# Patient Record
Sex: Male | Born: 1960 | Race: Black or African American | Hispanic: No | Marital: Married | Smoking: Former smoker
Health system: Southern US, Community
[De-identification: ages and names within clinical notes are randomized; demographics above are authoritative.]

## PROBLEM LIST (undated history)

## (undated) DIAGNOSIS — J45909 Unspecified asthma, uncomplicated: Secondary | ICD-10-CM

## (undated) DIAGNOSIS — E119 Type 2 diabetes mellitus without complications: Secondary | ICD-10-CM

## (undated) DIAGNOSIS — F32A Depression, unspecified: Secondary | ICD-10-CM

## (undated) DIAGNOSIS — I639 Cerebral infarction, unspecified: Secondary | ICD-10-CM

## (undated) DIAGNOSIS — M1990 Unspecified osteoarthritis, unspecified site: Secondary | ICD-10-CM

## (undated) DIAGNOSIS — N529 Male erectile dysfunction, unspecified: Secondary | ICD-10-CM

## (undated) DIAGNOSIS — R0600 Dyspnea, unspecified: Secondary | ICD-10-CM

## (undated) DIAGNOSIS — J449 Chronic obstructive pulmonary disease, unspecified: Secondary | ICD-10-CM

## (undated) DIAGNOSIS — D869 Sarcoidosis, unspecified: Secondary | ICD-10-CM

## (undated) DIAGNOSIS — E785 Hyperlipidemia, unspecified: Secondary | ICD-10-CM

## (undated) DIAGNOSIS — F419 Anxiety disorder, unspecified: Secondary | ICD-10-CM

## (undated) DIAGNOSIS — I10 Essential (primary) hypertension: Secondary | ICD-10-CM

## (undated) HISTORY — DX: Sarcoidosis, unspecified: D86.9

## (undated) HISTORY — DX: Chronic obstructive pulmonary disease, unspecified: J44.9

## (undated) HISTORY — DX: Essential (primary) hypertension: I10

## (undated) HISTORY — DX: Unspecified asthma, uncomplicated: J45.909

## (undated) HISTORY — DX: Hyperlipidemia, unspecified: E78.5

## (undated) HISTORY — DX: Male erectile dysfunction, unspecified: N52.9

## (undated) HISTORY — PX: LUNG SURGERY: SHX703

## (undated) HISTORY — DX: Unspecified osteoarthritis, unspecified site: M19.90

---

## 2016-09-13 DIAGNOSIS — H9312 Tinnitus, left ear: Secondary | ICD-10-CM | POA: Insufficient documentation

## 2016-10-05 ENCOUNTER — Ambulatory Visit (INDEPENDENT_AMBULATORY_CARE_PROVIDER_SITE_OTHER)
Admission: RE | Admit: 2016-10-05 | Discharge: 2016-10-05 | Disposition: A | Discharge status: Home / Self Care | Payer: Medicaid Other | Source: Ambulatory Visit | Attending: Internal Medicine | Admitting: Internal Medicine

## 2016-10-05 ENCOUNTER — Encounter: Payer: Self-pay | Admitting: Internal Medicine

## 2016-10-05 ENCOUNTER — Ambulatory Visit (INDEPENDENT_AMBULATORY_CARE_PROVIDER_SITE_OTHER): Payer: Medicaid Other | Admitting: Internal Medicine

## 2016-10-05 VITALS — BP 122/86 | HR 83 | Ht 65.0 in | Wt 131.8 lb

## 2016-10-05 DIAGNOSIS — J449 Chronic obstructive pulmonary disease, unspecified: Secondary | ICD-10-CM | POA: Diagnosis not present

## 2016-10-05 DIAGNOSIS — J986 Disorders of diaphragm: Secondary | ICD-10-CM | POA: Diagnosis not present

## 2016-10-05 MED ORDER — PREDNISONE 10 MG PO TABS: ORAL_TABLET | ORAL | 0 refills | Status: DC

## 2016-10-05 MED ORDER — MOMETASONE FURO-FORMOTEROL FUM 200-5 MCG/ACT IN AERO: 2.0000 | INHALATION_SPRAY | Freq: Two times a day (BID) | RESPIRATORY_TRACT | 11 refills | Status: DC

## 2016-10-05 MED ORDER — MOMETASONE FURO-FORMOTEROL FUM 200-5 MCG/ACT IN AERO: 2.0000 | INHALATION_SPRAY | Freq: Two times a day (BID) | RESPIRATORY_TRACT | 0 refills | Status: DC

## 2016-10-05 NOTE — Patient Instructions (Addendum)
Prednisone 10 mg take  4 each am x 2 days,   2 each am x 2 days,  1 each am x 2 days and stop   Plan A = Automatic = dulera 200 Take 2 puffs first thing in am and then another 2 puffs about 12 hours later.    Work on inhaler technique:  relax and gently blow all the way out then take a nice smooth deep breath back in, triggering the inhaler at same time you start breathing in.  Hold for up to 5 seconds if you can. Blow out thru nose. Rinse and gargle with water when done     Plan B = Backup Only use your albuterol (proair) as a rescue medication to be used if you can't catch your breath by resting or doing a relaxed purse lip breathing pattern.  - The less you use it, the better it will work when you need it. - Ok to use the inhaler up to 2 puffs  every 4 hours if you must but call for appointment if use goes up over your usual need - Don't leave home without it !!  (think of it like the spare tire for your car)   Plan C = Crisis - only use your albuterol nebulizer if you first try Plan B and it fails to help > ok to use the nebulizer up to every 4 hours but if start needing it regularly call for immediate appointment   Please schedule a follow up office visit in 6 weeks, call sooner if needed with pfts

## 2016-10-05 NOTE — Progress Notes (Signed)
Subjective:     Patient ID: Glenn [name], male   DOB: 1960-08-23,     MRN: [id number]  HPI    [age] yobm quit smoking 2016 with onset of symptoms in 1995 [place] in [place] dx as asthma vs sarcoid and rx pred/ theoph / albuterol seen by allergist dx as pollen moved to [place] where underwent bilateral lung surgery 2011 ? Lung vol reduction ? Helped breathing at the time but uses [facility] parking and  [facility] = can't walk a nl pace on a flat grade s sob but does fine slow and flat so referred to pulmonary clinic 10/05/2016 by Dr  Tyson DenseAvbuerre    10/05/2016 1st [facility] office visit/ Glenn [name]   Chief Complaint  Patient presents with  . Pulmonary Consult    Referred by Dr. Alveta HeimlichEdwin Avebuere for eval of Asthma and COPD. Pt states he was dxed with COPD and Asthma in 1995. [place] He states his breathing is "okay" today. He states he has a "slight cold"- prod cough with minimal brown sputum.    not much better since quit smoking = freq noct wheeze/ cough and need for saba and while on maint rx = arnuity  Quite a bit better with last pred/ very poor hfa baseline/ way overusing saba in multiple forms  Doe = MMRC2 = can't walk a nl pace on a flat grade s sob but does fine slow and flat eg    No obvious day to day or daytime variability or assoc   mucus plugs or hemoptysis or cp or chest tightness, subjective wheeze or overt sinus or hb symptoms. No unusual exp hx or h/o childhood pna/ asthma or knowledge of premature birth.  Sleeping ok without nocturnal  or early am exacerbation  of respiratory  c/o's or need for noct saba. Also denies any obvious fluctuation of symptoms with weather or environmental changes or other aggravating or alleviating factors except as outlined above   Current Medications, Allergies, Complete Past Medical History, Past Surgical History, Family History, and Social History were reviewed in [facility] electronic medical record.  ROS  The following are not  active complaints unless bolded sore throat, dysphagia, dental problems, itching, sneezing,  nasal congestion or excess/ purulent secretions, ear ache,   fever, chills, sweats, unintended wt loss, classically pleuritic or exertional cp,  orthopnea pnd or leg swelling, presyncope, palpitations, abdominal pain, anorexia, nausea, vomiting, diarrhea  or change in bowel or bladder habits, change in stools or urine, dysuria,hematuria,  rash, arthralgias, visual complaints, headache, numbness, weakness or ataxia or problems with walking or coordination,  change in mood/affect or memory.           Review of Systems     Objective:   Physical Exam Thin amb bm nad   Wt Readings from Last 3 Encounters:  10/05/16 131 lb 12.8 oz (59.8 kg)    Vital signs reviewed  - Note on arrival 02 sats  97% on RA     HEENT: nl dentition, turbinates bilaterally, and oropharynx. Nl external ear canals without cough reflex   NECK :  without JVD/Nodes/TM/ nl carotid upstrokes bilaterally   LUNGS: no acc muscle use,  Nl contour chest with minimal insp and exp rhonchi bilaterally   CV:  RRR  no s3 or murmur or increase in P2, and no edema   ABD:  soft and nontender with nl inspiratory excursion in the supine position. No bruits or organomegaly appreciated, bowel  sounds nl  MS:  Nl gait/ ext warm without deformities, calf tenderness, cyanosis or clubbing No obvious joint restrictions   SKIN: warm and dry without lesions    NEURO:  alert, approp, nl sensorium with  no motor or cerebellar deficits apparent.      CXR PA and Lateral:   10/05/2016 :    I personally reviewed images and   impression as follows:    mild coarse int changes/ prominent bronchial markings/ elevated LHD s comparison       Assessment:

## 2016-10-06 DIAGNOSIS — J986 Disorders of diaphragm: Secondary | ICD-10-CM | POA: Insufficient documentation

## 2016-10-06 NOTE — Assessment & Plan Note (Addendum)
Quit smoking 2016 - Spirometry 10/05/2016  FEV1 1.36 (52%)  Ratio 65 p saba with classic curvature - 10/05/2016  After extensive coaching HFA effectiveness =    75% from a baseline of 25% > try dulera 200 2bid     I reviewed the Fletcher curve with the patient that basically indicates  if you quit smoking when your best day FEV1 is still   preserved (as is only relatively  the case here)  it is highly unlikely you will progress to endstage disease and informed the patient there was  no medication on the market that has proven to alter the curve/ its downward trajectory  or the likelihood of progression of their disease(unlike other chronic medical conditions such as atheroclerosis where we do think we can change the natural hx with risk reducing meds)    Therefore stopping smoking and maintaining abstinence is the most important aspect of care, not choice of inhalers or for that matter, doctors.    rx is directed 100% at symptoms, not changing the natural hx of the dz, and has not done well just on ICS, which have no role as monotherapy in copd and  therefore rec start with dulera 200 2bid based on h/o pred response and for same reason give short course of prednisone to see if can improve/ sustain better baseline function  Total time devoted to counseling  > 50 % of initial 60 min office visit:  review case with pt/wife discussion of options/alternatives/ personally creating written customized instructions  in presence of pt  then going over those specific  Instructions directly with the pt including how to use all of the meds but in particular covering each new medication in detail and the difference between the maintenance= "automatic" meds and the prns using an action plan format for the latter (If this problem/symptom => do that organization reading Left to right).  Please see AVS from this visit for a full list of these instructions which I personally wrote for this pt and  are unique to this visit.

## 2016-10-06 NOTE — Assessment & Plan Note (Signed)
Noted 10/05/2016 s baseline   Work up is typically a ct with contrast and fluoro to be sure there's not medistinal/phrenic nerve compression/ dysfunction but will put this off for now

## 2016-10-06 NOTE — Progress Notes (Signed)
Spoke with pt and notified of results per Dr. Wert. Pt verbalized understanding and denied any questions. 

## 2016-11-18 ENCOUNTER — Ambulatory Visit: Payer: Medicaid Other | Admitting: Internal Medicine

## 2017-10-09 ENCOUNTER — Encounter (HOSPITAL_COMMUNITY): Payer: Self-pay

## 2017-10-09 ENCOUNTER — Ambulatory Visit (HOSPITAL_COMMUNITY)
Admission: EM | Admit: 2017-10-09 | Discharge: 2017-10-09 | Disposition: A | Discharge status: Home / Self Care | Payer: Medicaid Other | Attending: Family Medicine | Admitting: Family Medicine

## 2017-10-09 DIAGNOSIS — Z87891 Personal history of nicotine dependence: Secondary | ICD-10-CM | POA: Diagnosis not present

## 2017-10-09 DIAGNOSIS — Z113 Encounter for screening for infections with a predominantly sexual mode of transmission: Secondary | ICD-10-CM

## 2017-10-09 DIAGNOSIS — Z202 Contact with and (suspected) exposure to infections with a predominantly sexual mode of transmission: Secondary | ICD-10-CM | POA: Diagnosis not present

## 2017-10-09 MED ORDER — VALACYCLOVIR HCL 1 G PO TABS: 1000.0000 mg | ORAL_TABLET | Freq: Two times a day (BID) | ORAL | 0 refills | Status: DC

## 2017-10-09 NOTE — Discharge Instructions (Signed)
We have sent testing for sexually transmitted infections. We will notify you of any positive results once they are received. If required, we will prescribe any medications you might need.  Please refrain from all sexual activity for at least the next seven days.  We have also sent a herpes culture from the lesion on your chin. Begin and finish taking the following: Meds ordered this encounter  Medications   valACYclovir (VALTREX) 1000 MG tablet    Sig: Take 1 tablet (1,000 mg total) by mouth 2 (two) times daily.    Dispense:  14 tablet    Refill:  0

## 2017-10-09 NOTE — ED Provider Notes (Signed)
[facility]   [id number] 10/09/17 Arrival Time: 7741  ASSESSMENT & PLAN:  1. Possible exposure to STD     Discharge Instructions      We have sent testing for sexually transmitted infections. We will notify you of any positive results once they are received. If required, we will prescribe any medications you might need.  Please refrain from all sexual activity for at least the next seven days.  We have also sent a herpes culture from the lesion on your chin. Begin and finish taking the following: Meds ordered this encounter  Medications  . valACYclovir (VALTREX) 1000 MG tablet    Sig: Take 1 tablet (1,000 mg total) by mouth 2 (two) times daily.    Dispense:  14 tablet    Refill:  0   Pending: Labs Reviewed  HSV CULTURE AND TYPING  RPR  HIV ANTIBODY (ROUTINE TESTING)  URINE CYTOLOGY ANCILLARY ONLY   Will notify of any positive results. Instructed to refrain from sexual activity for at least seven days.  Reviewed expectations re: course of current medical issues. Questions answered. Outlined signs and symptoms indicating need for more acute intervention. Patient verbalized understanding. After Visit Summary given.   SUBJECTIVE:  Glenn [name] is a 57 y.o. male who reports concern over possible STD exposure. Reports vaginal and oral sex last week with new male partner. Reports noticing 'a few small bumps' on his chin a few days ago. Tender. No drainage. No penile/genital lesions. No h/o herpes reported. Urinary symptoms: none. Afebrile. No abdominal or pelvic pain. OTC treatment: none.  ROS: As per HPI.  OBJECTIVE:  Vitals:   10/09/17 0931  BP: (!) 153/104  Pulse: 100  Resp: 20  Temp: 97.9 F (36.6 C)  TempSrc: Oral  SpO2: 95%     General appearance: alert, cooperative, appears stated age and no distress Throat: lips, mucosa, and tongue normal; teeth and gums normal Neck: no LAD Back: no CVA tenderness GU: declined Abdomen: soft,  non-tender Skin: warm and dry; over anterior chin he has 2-3 shallow ulcerations each approx 2-65mm in size; tender to touch; no drainage or fluctuance; does shave over this area Psychological: alert and cooperative. Normal mood and affect.  No results found for this or any previous visit.  Labs Reviewed  HSV CULTURE AND TYPING  RPR  HIV ANTIBODY (ROUTINE TESTING)  URINE CYTOLOGY ANCILLARY ONLY    Allergies  Allergen Reactions  . Shrimp [Shellfish Allergy] Itching  . Lisinopril Rash    Tongue swelling    History reviewed. No pertinent past medical history. History reviewed. No pertinent family history. Social History   Socioeconomic History  . Marital status: Married    Spouse name: Not on file  . Number of children: Not on file  . Years of education: Not on file  . Highest education level: Not on file  Occupational History  . Not on file  Social Needs  . Financial resource strain: Not on file  . Food insecurity:    Worry: Not on file    Inability: Not on file  . Transportation needs:    Medical: Not on file    Non-medical: Not on file  Tobacco Use  . Smoking status: Former Smoker    Packs/day: 0.50    Years: 34.00    Pack years: 17.00    Types: Cigarettes    Last attempt to quit: 02/07/2014    Years since quitting: 3.6  . Smokeless tobacco: Never Used  Substance and Sexual  Activity  . Alcohol use: Not on file  . Drug use: Not on file  . Sexual activity: Not on file  Lifestyle  . Physical activity:    Days per week: Not on file    Minutes per session: Not on file  . Stress: Not on file  Relationships  . Social connections:    Talks on phone: Not on file    Gets together: Not on file    Attends religious service: Not on file    Active member of club or organization: Not on file    Attends meetings of clubs or organizations: Not on file    Relationship status: Not on file  . Intimate partner violence:    Fear of current or ex partner: Not on file     Emotionally abused: Not on file    Physically abused: Not on file    Forced sexual activity: Not on file  Other Topics Concern  . Not on file  Social History Narrative  . Not on file          [name], MD 10/09/17 1003

## 2017-10-09 NOTE — ED Triage Notes (Signed)
Pt presents for STD testing 

## 2017-10-10 LAB — URINE CYTOLOGY ANCILLARY ONLY
CHLAMYDIA, DNA PROBE: NEGATIVE
Neisseria Gonorrhea: NEGATIVE
Trichomonas: NEGATIVE

## 2017-10-10 LAB — RPR: RPR: NONREACTIVE

## 2017-10-10 LAB — HIV ANTIBODY (ROUTINE TESTING W REFLEX): HIV SCREEN 4TH GENERATION: NONREACTIVE

## 2017-10-12 LAB — HSV CULTURE AND TYPING

## 2017-10-31 ENCOUNTER — Ambulatory Visit: Payer: Medicaid Other | Admitting: Internal Medicine

## 2017-11-02 ENCOUNTER — Encounter: Payer: Self-pay | Admitting: Gastroenterology

## 2017-11-16 ENCOUNTER — Other Ambulatory Visit: Payer: Self-pay | Admitting: Sports Medicine

## 2017-11-16 DIAGNOSIS — M545 Low back pain, unspecified: Secondary | ICD-10-CM

## 2017-11-21 ENCOUNTER — Ambulatory Visit: Payer: Medicaid Other | Admitting: Internal Medicine

## 2017-11-22 ENCOUNTER — Encounter: Payer: Self-pay | Admitting: Internal Medicine

## 2017-11-22 ENCOUNTER — Other Ambulatory Visit (INDEPENDENT_AMBULATORY_CARE_PROVIDER_SITE_OTHER): Payer: Medicaid Other

## 2017-11-22 ENCOUNTER — Ambulatory Visit (INDEPENDENT_AMBULATORY_CARE_PROVIDER_SITE_OTHER): Payer: Medicaid Other | Admitting: Internal Medicine

## 2017-11-22 ENCOUNTER — Ambulatory Visit (INDEPENDENT_AMBULATORY_CARE_PROVIDER_SITE_OTHER)
Admission: RE | Admit: 2017-11-22 | Discharge: 2017-11-22 | Disposition: A | Discharge status: Home / Self Care | Payer: Medicaid Other | Source: Ambulatory Visit | Attending: Internal Medicine | Admitting: Internal Medicine

## 2017-11-22 ENCOUNTER — Other Ambulatory Visit: Payer: Medicaid Other

## 2017-11-22 VITALS — BP 144/88 | HR 103 | Ht 65.0 in | Wt 142.0 lb

## 2017-11-22 DIAGNOSIS — J449 Chronic obstructive pulmonary disease, unspecified: Secondary | ICD-10-CM

## 2017-11-22 DIAGNOSIS — F17210 Nicotine dependence, cigarettes, uncomplicated: Secondary | ICD-10-CM | POA: Diagnosis not present

## 2017-11-22 LAB — CBC WITH DIFFERENTIAL/PLATELET
BASOS PCT: 0.2 % (ref 0.0–3.0)
Basophils Absolute: 0 10*3/uL (ref 0.0–0.1)
EOS ABS: 0.6 10*3/uL (ref 0.0–0.7)
EOS PCT: 10.8 % — AB (ref 0.0–5.0)
HEMATOCRIT: 43.5 % (ref 39.0–52.0)
HEMOGLOBIN: 14.3 g/dL (ref 13.0–17.0)
LYMPHS PCT: 45.6 % (ref 12.0–46.0)
Lymphs Abs: 2.7 10*3/uL (ref 0.7–4.0)
MCHC: 32.9 g/dL (ref 30.0–36.0)
MCV: 88.7 fl (ref 78.0–100.0)
MONOS PCT: 10.5 % (ref 3.0–12.0)
Monocytes Absolute: 0.6 10*3/uL (ref 0.1–1.0)
Neutro Abs: 1.9 10*3/uL (ref 1.4–7.7)
Neutrophils Relative %: 32.9 % — ABNORMAL LOW (ref 43.0–77.0)
Platelets: 233 10*3/uL (ref 150.0–400.0)
RBC: 4.91 Mil/uL (ref 4.22–5.81)
RDW: 13.9 % (ref 11.5–15.5)
WBC: 5.9 10*3/uL (ref 4.0–10.5)

## 2017-11-22 MED ORDER — ALBUTEROL SULFATE (2.5 MG/3ML) 0.083% IN NEBU: 2.5000 mg | INHALATION_SOLUTION | Freq: Four times a day (QID) | RESPIRATORY_TRACT | 2 refills | Status: DC | PRN

## 2017-11-22 MED ORDER — MOMETASONE FURO-FORMOTEROL FUM 200-5 MCG/ACT IN AERO: 2.0000 | INHALATION_SPRAY | Freq: Two times a day (BID) | RESPIRATORY_TRACT | 2 refills | Status: DC

## 2017-11-22 MED ORDER — ALBUTEROL SULFATE HFA 108 (90 BASE) MCG/ACT IN AERS: 2.0000 | INHALATION_SPRAY | Freq: Four times a day (QID) | RESPIRATORY_TRACT | 2 refills | Status: DC | PRN

## 2017-11-22 NOTE — Progress Notes (Addendum)
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 Lung vol reduction ? Helped breathing at the time but uses [facility] parking and  [facility] = can't walk a nl pace on a flat grade s sob but does fine slow and flat so referred to pulmonary clinic 10/05/2016 by Dr  Tyson Dense    History of Present Illness  10/05/2016 1st [facility] office visit/ Glenn [name]   Chief Complaint  Patient presents with  . Pulmonary Consult    Referred by Dr. Alveta Heimlich for eval of Asthma and COPD. Pt states he was dxed with COPD and Asthma in 1995. He moved here a year ago from [place], [place]. He states his breathing is "okay" today. He states he has a "slight cold"- prod cough with minimal brown sputum.    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  Work on Printmaker B = Backup Only use your albuterol (proair) as a rescue medication Plan C = Crisis - only use your albuterol nebulizer if you first try Plan B and it fails to help > ok to use the nebulizer up to every 4 hours but if start needing it regularly call for immediate appointment Please schedule a follow up office visit in 6 weeks, call sooner if needed with pfts     11/22/2017  f/u ov/Glenn [name] re:  GOLD II/ still smoking   Chief Complaint  Patient presents with  . Follow-up    Breathing is overall doing okay. He is wheezing and has occ non prod cough. He is using his albuterol inhaler and neb both 3-4 x daily.   Dyspnea:  MMRC2 = can't walk a nl pace on a flat grade s sob but does fine slow and flat  Cough: mostly dry, worse in house where he thinks there is mold Sleeping: some wheezing noct/ freq saba noct SABA use: as above 02: none    No obvious day to day or daytime variability or assoc excess/ purulent sputum or mucus plugs or hemoptysis or cp or chest tightness,   or overt sinus or hb symptoms.    Also denies any obvious fluctuation of symptoms with weather or environmental changes or other aggravating or alleviating factors except as outlined above   No unusual exposure hx or h/o childhood pna/ asthma or knowledge of premature birth.  Current Allergies, Complete Past Medical History, Past Surgical History, Family History, and Social History were reviewed in [facility] record.  ROS  The following are not active  complaints unless bolded Hoarseness, sore throat, dysphagia, dental problems, itching, sneezing,  nasal congestion or discharge of excess mucus or purulent secretions, ear ache,   fever, chills, sweats, unintended wt loss or wt gain, classically pleuritic or exertional cp,  orthopnea pnd or arm/hand swelling  or leg swelling, presyncope, palpitations, abdominal pain, anorexia, nausea, vomiting, diarrhea  or change in bowel habits or change in bladder habits, change in stools or change in urine, dysuria, hematuria,  rash, arthralgias, visual complaints, headache, numbness, weakness or ataxia or problems with walking or coordination,  change in mood or  memory.        Current Meds  Medication Sig  . albuterol (PROVENTIL HFA) 108 (90 Base) MCG/ACT inhaler Inhale 2 puffs into the lungs every 6 (six) hours as needed for wheezing or shortness of breath.  Marland Kitchen albuterol  (PROVENTIL) (2.5 MG/3ML) 0.083% nebulizer solution Take 3 mLs (2.5 mg total) by nebulization every 6 (six) hours as needed for wheezing or shortness of breath.  Marland Kitchen amLODipine (NORVASC) 10 MG tablet Take 10 mg by mouth daily.  Marland Kitchen gabapentin (NEURONTIN) 300 MG capsule Take 300 mg by mouth 3 (three) times daily.  . mometasone-formoterol (DULERA) 200-5 MCG/ACT AERO Inhale 2 puffs into the lungs 2 (two) times daily.  Marland Kitchen oxyCODONE (ROXICODONE) 15 MG immediate release tablet Take 15 mg by mouth every 4 (four) hours as needed for pain.  . sildenafil (REVATIO) 20 MG tablet Take 20 mg by mouth daily as needed.  . sildenafil (VIAGRA) 100 MG tablet Take 100 mg by mouth daily as needed for erectile dysfunction.  . traMADol (ULTRAM) 50 MG tablet Take 50 mg by mouth every 12 (twelve) hours as needed.  . valACYclovir (VALTREX) 1000 MG tablet Take 1 tablet (1,000 mg total) by mouth 2 (two) times daily.  . Vitamin D, Ergocalciferol, (DRISDOL) 50000 units CAPS capsule Take 50,000 Units by mouth every 7 (seven) days.  Marland Kitchen   albuterol (PROVENTIL HFA) 108 (90 Base) MCG/ACT inhaler Inhale 2 puffs into the lungs every 6 (six) hours as needed for wheezing or shortness of breath.  Marland Kitchen   albuterol (PROVENTIL) (2.5 MG/3ML) 0.083% nebulizer solution Take 2.5 mg by nebulization every 6 (six) hours as needed for wheezing or shortness of breath.  . [  mometasone-formoterol (DULERA) 200-5 MCG/ACT AERO Inhale 2 puffs into the lungs 2 (two) times daily.              Objective:   Physical Exam    Thin amb bm nad   Wt Readings from Last 3 Encounters:  11/22/17 142 lb (64.4 kg)  10/05/16 131 lb 12.8 oz (59.8 kg)     Vital signs reviewed - Note on arrival 02 sats  97% on RA          HEENT: nl dentition / oropharynx. Nl external ear canals without cough reflex -  Mild bilateral non-specific turbinate edema     NECK :  without JVD/Nodes/TM/ nl carotid upstrokes bilaterally   LUNGS: no acc muscle use,  Mild barrel  contour  chest wall with bilateral  Distant insp/ exp rhonchi   and  without cough on insp or exp maneuver and mild  Hyperresonant  to  percussion bilaterally     CV:  RRR  no s3 or murmur or increase in P2, and no edema   ABD:  soft and nontender with pos late insp Hoover's  in the supine position. No bruits or organomegaly appreciated, bowel sounds nl  MS:  Nl gait/  ext warm without deformities, calf tenderness, cyanosis or clubbing No obvious joint restrictions   SKIN: warm and dry without lesions    NEURO:  alert, approp, nl sensorium with  no motor or cerebellar deficits apparent.          CXR PA and Lateral:   11/22/2017 :    I personally reviewed images and agree with radiology impression as follows:   Bilateral sequela of previous surgeries in the lungs. No acute abnormalities identified.   Labs ordered 11/22/2017  Allergy profile     Assessment:

## 2017-11-22 NOTE — Patient Instructions (Addendum)
Prednisone 10 mg take  4 each am x 2 days,   2 each am x 2 days,  1 each am x 2 days and stop   Plan A = Automatic = dulera 200 Take 2 puffs first thing in am and then another 2 puffs about 12 hours later.   Work on inhaler technique:  relax and gently blow all the way out then take a nice smooth deep breath back in, triggering the inhaler at same time you start breathing in.  Hold for up to 5 seconds if you can. Blow out thru nose. Rinse and gargle with water when done  Plan B = Backup Only use your albuterol (proair) as a rescue medication to be used if you can't catch your breath by resting or doing a relaxed purse lip breathing pattern.  - The less you use it, the better it will work when you need it. - Ok to use the inhaler up to 2 puffs  every 4 hours if you must but call for appointment if use goes up over your usual need - Don't leave home without it !!  (think of it like the spare tire for your car)   Plan C = Crisis - only use your albuterol nebulizer if you first try Plan B and it fails to help > ok to use the nebulizer up to every 4 hours but if start needing it regularly call for immediate appointment   The key is to stop smoking completely before smoking completely stops you!   Please remember to go to the lab and x-ray department downstairs in the basement  for your tests - we will call you with the results when they are available.     Please schedule a follow up office visit in 6 weeks, call sooner if needed with pfts on return

## 2017-11-23 LAB — RESPIRATORY ALLERGY PROFILE REGION II ~~LOC~~
Allergen, A. alternata, m6: <0.1 kU/L
Allergen, Cedar tree, t12: <0.1 kU/L
Allergen, Comm Silver Birch, t9: <0.1 kU/L
Allergen, Cottonwood, t14: <0.1 kU/L
Allergen, Mulberry, t76: <0.1 kU/L
Bermuda Grass: <0.1 kU/L
CLADOSPORIUM HERBARUM (M2) IGE: <0.1 kU/L
CLASS: 0
CLASS: 0
CLASS: 0
CLASS: 0
CLASS: 0
CLASS: 0
COCKROACH: 0.91 kU/L — AB
COMMON RAGWEED (SHORT) (W1) IGE: <0.1 kU/L
Cat Dander: <0.1 kU/L
Class: 0
Class: 0
Class: 0
Class: 0
Class: 0
Class: 0
Class: 0
Class: 0
Class: 0
Class: 0
Class: 0
Class: 0
Class: 0
Class: 0
Class: 0
Class: 0
Class: 0
Class: 2
Dog Dander: <0.1 kU/L
IGE (IMMUNOGLOBULIN E), SERUM: 25 kU/L (ref <=114)
Pecan/Hickory Tree IgE: <0.1 kU/L
Sheep Sorrel IgE: <0.1 kU/L

## 2017-11-23 LAB — INTERPRETATION:

## 2017-11-23 NOTE — Progress Notes (Signed)
Spoke with pt and notified of results per Dr. Wert. Pt verbalized understanding and denied any questions. 

## 2017-11-24 ENCOUNTER — Encounter: Payer: Self-pay | Admitting: Internal Medicine

## 2017-11-24 DIAGNOSIS — F17210 Nicotine dependence, cigarettes, uncomplicated: Secondary | ICD-10-CM | POA: Insufficient documentation

## 2017-11-24 NOTE — Assessment & Plan Note (Signed)
Quit smoking 2016 - Spirometry 10/05/2016  FEV1 1.36 (52%)  Ratio 65 p saba with classic curvature - 10/05/2016  > try dulera 200 2bid   - Allergy profile 11/22/2017 >  Eos 0.6 /  IgE  25 RAST neg x for roaches/ neg for mold  - 11/22/2017  After extensive coaching inhaler device,  effectiveness =    75% > continue dulera 200 2bid   Way over using saba at baseline. DDX of  difficult airways management almost all start with A and  include Adherence, Ace Inhibitors, Acid Reflux, Active Sinus Disease, Alpha 1 Antitripsin deficiency, Anxiety masquerading as Airways dz,  ABPA,  Allergy(esp in young), Aspiration (esp in elderly), Adverse effects of meds,  Active smoking or vaping, A bunch of PE's (a small clot burden can't cause this syndrome unless there is already severe underlying pulm or vascular dz with poor reserve) plus two Bs  = Bronchiectasis and Beta blocker use..and one C= CHF   Adherence is always the initial "prime suspect" and is a multilayered concern that requires a "trust but verify" approach in every patient - starting with knowing how to use medications, especially inhalers, correctly, keeping up with refills and understanding the fundamental difference between maintenance and prns vs those medications only taken for a very short course and then stopped and not refilled.  - see hfa teaching - return with all meds in hand using a trust but verify approach to confirm accurate Medication  Reconciliation The principal here is that until we are certain that the  patients are doing what we've asked, it makes no sense to ask them to do more.   Active smoking also at top of list > see sep a/p  ? Allergy / asthmatic component > allergy profile pos for Eos but neg for mold noted > advised regardless of identification of allergen if he's sure his present appt is making him sick he actively should seek alternative  ? Anxiety/depression > usually at the bottom of this list of usual suspects but should  be much higher on this pt's based on H and P  and may interfere with adherence and also interpretation of response or lack thereof to symptom management which can be quite subjective.

## 2017-11-24 NOTE — Assessment & Plan Note (Signed)
4-5 min discussion re active cigarette smoking in addition to office E&M  Ask about tobacco use:   ongoing Advise quitting   I emphasized that although we never turn away smokers from the pulmonary clinic, we do ask that they understand that the recommendations that we make  won't work nearly as well in the presence of continued cigarette exposure. In fact, we may very well  reach a point where we can't promise to help the patient if he/she can't quit smoking. (We can and will promise to try to help, we just can't promise what we recommend will really work)  Assess willingness:  Not committed at this point Assist in quit attempt:  Per PCP when ready Arrange follow up:   Follow up per Primary Care planned      I had an extended discussion with the patient reviewing all relevant studies completed to date and  lasting 15 to 20 minutes of a 25 minute visit    See device teaching which extended face to face time for this visit.  Each maintenance medication was reviewed in detail including emphasizing most importantly the difference between maintenance and prns and under what circumstances the prns are to be triggered using an action plan format that is not reflected in the computer generated alphabetically organized AVS which I have not found useful in most complex patients, especially with respiratory illnesses  Please see AVS for specific instructions unique to this visit that I personally wrote and verbalized to the the pt in detail and then reviewed with pt  by my nurse highlighting any  changes in therapy recommended at today's visit to their plan of care.        

## 2017-12-07 ENCOUNTER — Ambulatory Visit (HOSPITAL_COMMUNITY)
Admission: EM | Admit: 2017-12-07 | Discharge: 2017-12-07 | Disposition: A | Discharge status: Home / Self Care | Payer: Medicaid Other | Attending: Family Medicine | Admitting: Family Medicine

## 2017-12-07 ENCOUNTER — Encounter (HOSPITAL_COMMUNITY): Payer: Self-pay | Admitting: Emergency Medicine

## 2017-12-07 DIAGNOSIS — Z113 Encounter for screening for infections with a predominantly sexual mode of transmission: Secondary | ICD-10-CM | POA: Insufficient documentation

## 2017-12-07 DIAGNOSIS — R21 Rash and other nonspecific skin eruption: Secondary | ICD-10-CM | POA: Diagnosis present

## 2017-12-07 DIAGNOSIS — S50862A Insect bite (nonvenomous) of left forearm, initial encounter: Secondary | ICD-10-CM

## 2017-12-07 DIAGNOSIS — Z79899 Other long term (current) drug therapy: Secondary | ICD-10-CM | POA: Diagnosis not present

## 2017-12-07 DIAGNOSIS — W57XXXA Bitten or stung by nonvenomous insect and other nonvenomous arthropods, initial encounter: Secondary | ICD-10-CM | POA: Diagnosis not present

## 2017-12-07 DIAGNOSIS — Z711 Person with feared health complaint in whom no diagnosis is made: Secondary | ICD-10-CM

## 2017-12-07 DIAGNOSIS — Z888 Allergy status to other drugs, medicaments and biological substances status: Secondary | ICD-10-CM | POA: Diagnosis not present

## 2017-12-07 DIAGNOSIS — Z91013 Allergy to seafood: Secondary | ICD-10-CM | POA: Diagnosis not present

## 2017-12-07 DIAGNOSIS — F17210 Nicotine dependence, cigarettes, uncomplicated: Secondary | ICD-10-CM | POA: Diagnosis not present

## 2017-12-07 DIAGNOSIS — Z202 Contact with and (suspected) exposure to infections with a predominantly sexual mode of transmission: Secondary | ICD-10-CM

## 2017-12-07 MED ORDER — AZITHROMYCIN 250 MG PO TABS
ORAL_TABLET | ORAL | Status: AC
  Filled 2017-12-07: qty 4

## 2017-12-07 MED ORDER — AZITHROMYCIN 250 MG PO TABS
1000.0000 mg | ORAL_TABLET | Freq: Once | ORAL | Status: AC
  Administered 2017-12-07: 1000 mg via ORAL

## 2017-12-07 MED ORDER — CEFTRIAXONE SODIUM 250 MG IJ SOLR
INTRAMUSCULAR | Status: AC
  Filled 2017-12-07: qty 250

## 2017-12-07 MED ORDER — CEFTRIAXONE SODIUM 250 MG IJ SOLR
250.0000 mg | Freq: Once | INTRAMUSCULAR | Status: AC
  Administered 2017-12-07: 250 mg via INTRAMUSCULAR

## 2017-12-07 NOTE — ED Triage Notes (Signed)
Pt also wants std check.

## 2017-12-07 NOTE — ED Provider Notes (Signed)
[facility]   [id number] 12/07/17 Arrival Time: 1238  CC: SKIN COMPLAINT and STD concern  SUBJECTIVE:  Glenn [name] is a 57 y.o. male who presents with a possible bug bite.  Symptoms began after staying in a hotel.  Localizes the rash to left forearm.  Describes it as red and itchy.  Denies aggravating or alleviating factors.  Denies similar symptoms in the past.  Denies fever, chills, nausea, vomiting, erythema, redness, swollen glands, SOB, chest pain, abdominal pain, changes in bowel or bladder function.    Patient requesting STD check.  Last unprotected sex a couple of weeks ago.  Sexually active with 1 male partner.   Reports previous symptoms in the past.  Denies fever, chills, nausea, vomiting, abdominal or pelvic pain, dysuria, penile discharge, penile rashes or lesions, testicular swelling or pain.    ROS: As per HPI.  History reviewed. No pertinent past medical history. History reviewed. No pertinent surgical history. Allergies  Allergen Reactions  . Shrimp [Shellfish Allergy] Itching  . Lisinopril Rash    Tongue swelling   No current facility-administered medications on file prior to encounter.    Current Outpatient Medications on File Prior to Encounter  Medication Sig Dispense Refill  . albuterol (PROVENTIL HFA) 108 (90 Base) MCG/ACT inhaler Inhale 2 puffs into the lungs every 6 (six) hours as needed for wheezing or shortness of breath. 1 Inhaler 2  . albuterol (PROVENTIL) (2.5 MG/3ML) 0.083% nebulizer solution Take 3 mLs (2.5 mg total) by nebulization every 6 (six) hours as needed for wheezing or shortness of breath. 150 mL 2  . amLODipine (NORVASC) 10 MG tablet Take 10 mg by mouth daily.    Marland Kitchen gabapentin (NEURONTIN) 300 MG capsule Take 300 mg by mouth 3 (three) times daily.    . mometasone-formoterol (DULERA) 200-5 MCG/ACT AERO Inhale 2 puffs into the lungs 2 (two) times daily. 1 Inhaler 2  . oxyCODONE (ROXICODONE) 15 MG immediate release tablet Take 15 mg  by mouth every 4 (four) hours as needed for pain.    . sildenafil (REVATIO) 20 MG tablet Take 20 mg by mouth daily as needed.    . sildenafil (VIAGRA) 100 MG tablet Take 100 mg by mouth daily as needed for erectile dysfunction.    . traMADol (ULTRAM) 50 MG tablet Take 50 mg by mouth every 12 (twelve) hours as needed.    . valACYclovir (VALTREX) 1000 MG tablet Take 1 tablet (1,000 mg total) by mouth 2 (two) times daily. 14 tablet 0  . Vitamin D, Ergocalciferol, (DRISDOL) 50000 units CAPS capsule Take 50,000 Units by mouth every 7 (seven) days.     Social History   Socioeconomic History  . Marital status: Married    Spouse name: Not on file  . Number of children: Not on file  . Years of education: Not on file  . Highest education level: Not on file  Occupational History  . Not on file  Social Needs  . Financial resource strain: Not on file  . Food insecurity:    Worry: Not on file    Inability: Not on file  . Transportation needs:    Medical: Not on file    Non-medical: Not on file  Tobacco Use  . Smoking status: Current Every Day Smoker    Packs/day: 0.50    Years: 34.00    Pack years: 17.00    Types: Cigarettes  . Smokeless tobacco: Never Used  Substance and Sexual Activity  . Alcohol use: Not on file  .  Drug use: Not on file  . Sexual activity: Not on file  Lifestyle  . Physical activity:    Days per week: Not on file    Minutes per session: Not on file  . Stress: Not on file  Relationships  . Social connections:    Talks on phone: Not on file    Gets together: Not on file    Attends religious service: Not on file    Active member of club or organization: Not on file    Attends meetings of clubs or organizations: Not on file    Relationship status: Not on file  . Intimate partner violence:    Fear of current or ex partner: Not on file    Emotionally abused: Not on file    Physically abused: Not on file    Forced sexual activity: Not on file  Other Topics Concern   . Not on file  Social History Narrative  . Not on file   No family history on file.  OBJECTIVE: Vitals:   12/07/17 1244  BP: 129/89  Pulse: 93  Resp: 16  Temp: 98.1 F (36.7 C)  SpO2: 97%    General appearance: alert; no distress  HEENT: NCAT; Ears: EACs clear, TMs pearly gray; Eyes: PERRL.  EOM grossly intact.  Nose: no rhinorrhea; Throat: tonsils nonerythematous, uvula midline  Lungs: clear to auscultation bilaterally Heart: regular rate and rhythm.  Radial pulse 2+ bilaterally Abdomen: soft, nondistended, normal active bowel sounds; nontender to palpation; no guarding Extremities: no edema Skin: warm and dry; 1 small erythematous papule/pustule localized to left anterior forearm; <0.5 cm in diameter; nontender; no active discharge; no surrounding erythema  Psychological: alert and cooperative; normal mood and affect  ASSESSMENT & PLAN:  1. Bug bite, initial encounter   2. Concern about STD in male without diagnosis     Meds ordered this encounter  Medications  . cefTRIAXone (ROCEPHIN) injection 250 mg    Order Specific Question:   Antibiotic Indication:    Answer:   STD  . azithromycin (ZITHROMAX) tablet 1,000 mg   Wash all clothes and bed linens in home with hot water and detergent It appears the bug bite was an isolated event, but if you start having new bites please come back in to be reevaluated. Given rocephin 250mg  injection and azithromycin 1g in office Urine cytology obtained HIV/ syphilis testing today We will follow up with you regarding the results of your test If tests are positive, please abstain from sexual activity for at least 7 days and notify partners Follow up with PCP if symptoms persists Return here or go to ER if you have any new or worsening symptoms    Reviewed expectations re: course of current medical issues. Questions answered. Outlined signs and symptoms indicating need for more acute intervention. Patient verbalized  understanding. After Visit Summary given.   Rennis Harding, PA-C 12/07/17 1326

## 2017-12-07 NOTE — ED Triage Notes (Signed)
Pt states he stayed in a hotel that he found bed bugs. Pt states he noticed bumps on his skin and is worried about bites.

## 2017-12-07 NOTE — Discharge Instructions (Addendum)
Wash all clothes and bed linens in home water and with detergent It appears the bug bite was an isolated event, but if you start having new bites please come back in to be reevaluated. Given rocephin 250mg  injection and azithromycin 1g in office Urine cytology obtained HIV/ syphilis testing today We will follow up with you regarding the results of your test If tests are positive, please abstain from sexual activity for at least 7 days and notify partners Follow up with PCP if symptoms persists Return here or go to ER if you have any new or worsening symptoms

## 2017-12-08 LAB — RPR: RPR Ser Ql: NONREACTIVE

## 2017-12-08 LAB — URINE CYTOLOGY ANCILLARY ONLY
Chlamydia: NEGATIVE
Neisseria Gonorrhea: NEGATIVE
TRICH (WINDOWPATH): NEGATIVE

## 2017-12-08 LAB — HIV ANTIBODY (ROUTINE TESTING W REFLEX): HIV Screen 4th Generation wRfx: NONREACTIVE

## 2017-12-12 ENCOUNTER — Encounter: Payer: Self-pay | Admitting: Gastroenterology

## 2017-12-12 ENCOUNTER — Ambulatory Visit: Payer: Medicaid Other | Admitting: Gastroenterology

## 2017-12-12 VITALS — BP 110/72 | HR 76 | Ht 65.0 in | Wt 138.0 lb

## 2017-12-12 DIAGNOSIS — Z1211 Encounter for screening for malignant neoplasm of colon: Secondary | ICD-10-CM | POA: Diagnosis not present

## 2017-12-12 DIAGNOSIS — Z86010 Personal history of colonic polyps: Secondary | ICD-10-CM

## 2017-12-12 NOTE — Progress Notes (Signed)
GASTROENTEROLOGY OUTPATIENT CLINIC VISIT   Primary Care Provider Fleet Contras, MD [address] [place] [address] [phone]  Referring Provider Fleet Contras, MD [address] [place], [place] [address] [phone]  Patient Profile: Taurean Ju is a 57 y.o. male with a pmh significant for COPD/Asthma, HTN, OA.  The patient presents to the [facility] for an evaluation and management of problem(s) noted below:  Problem List 1. Colon cancer screening   2. History of colonic polyps     History of Present Illness: This is the patient's first visit to the GI clinic.  He reports a prior colonoscopy 5-years ago.  He reports polyps being found but does not know the exact type of polyps that were removed.  He has no family history of colon cancers or GI malignancies.  The patient also has a bowel movement on a daily basis that is Bristol scale 4.  He denies any blood in the stools.  He does not have constipation.  He is otherwise doing very well.  GI Review of Systems Positive as above Negative for pyrosis, dysphagia, odynophagia, jaundice, abdominal pain, change in appetite early satiety, bloating, melena, hematochezia  Review of Systems General: Denies fevers/chills/weight loss HEENT: Denies oral lesions Cardiovascular: Denies chest pain/palpitations Pulmonary: SOB is at baseline as well as cough Gastroenterological: See HPI Genitourinary: Denies darkened urine Hematological: Denies easy bruising/bleeding Endocrine: Denies temperature intolerance Dermatological: Denies jaundice Psychological: Mood is stable Musculoskeletal: Denies new arthralgias   Medications Current Outpatient Medications  Medication Sig Dispense Refill  . albuterol (PROVENTIL HFA) 108 (90 Base) MCG/ACT inhaler Inhale 2 puffs into the lungs every 6 (six) hours as needed for wheezing or shortness of breath. 1 Inhaler 2  . albuterol (PROVENTIL) (2.5 MG/3ML) 0.083%  nebulizer solution Take 3 mLs (2.5 mg total) by nebulization every 6 (six) hours as needed for wheezing or shortness of breath. 150 mL 2  . amLODipine (NORVASC) 10 MG tablet Take 10 mg by mouth daily.    Marland Kitchen aspirin EC 81 MG tablet Take 81 mg by mouth daily.    Marland Kitchen gabapentin (NEURONTIN) 300 MG capsule Take 300 mg by mouth 3 (three) times daily.    . mometasone-formoterol (DULERA) 200-5 MCG/ACT AERO Inhale 2 puffs into the lungs 2 (two) times daily. 1 Inhaler 2  . nitroGLYCERIN (NITROSTAT) 0.4 MG SL tablet Place 0.4 mg under the tongue every 5 (five) minutes as needed for chest pain.    Marland Kitchen oxyCODONE (ROXICODONE) 15 MG immediate release tablet Take 15 mg by mouth every 4 (four) hours as needed for pain.    . sildenafil (VIAGRA) 100 MG tablet Take 100 mg by mouth daily as needed for erectile dysfunction.    . traMADol (ULTRAM) 50 MG tablet Take 50 mg by mouth every 12 (twelve) hours as needed.    . valACYclovir (VALTREX) 1000 MG tablet Take 1 tablet (1,000 mg total) by mouth 2 (two) times daily. 14 tablet 0  . Vitamin D, Ergocalciferol, (DRISDOL) 50000 units CAPS capsule Take 50,000 Units by mouth every 7 (seven) days.     No current facility-administered medications for this visit.     Allergies Allergies  Allergen Reactions  . Shrimp [Shellfish Allergy] Itching  . Lisinopril Rash    Tongue swelling    Histories Past Medical History:  Diagnosis Date  . Arthritis   . Asthma   . COPD (chronic obstructive pulmonary disease) (HCC)   . ED (erectile dysfunction)   . Hypertension    Past  Surgical History:  Procedure Laterality Date  . LUNG SURGERY     removed tissue   Social History   Socioeconomic History  . Marital status: Married    Spouse name: Not on file  . Number of children: Not on file  . Years of education: Not on file  . Highest education level: Not on file  Occupational History  . Not on file  Social Needs  . Financial resource strain: Not on file  . Food insecurity:      Worry: Not on file    Inability: Not on file  . Transportation needs:    Medical: Not on file    Non-medical: Not on file  Tobacco Use  . Smoking status: Current Every Day Smoker    Packs/day: 0.50    Years: 34.00    Pack years: 17.00    Types: Cigarettes  . Smokeless tobacco: Never Used  . Tobacco comment: tobacco given today 12/12/17  Substance and Sexual Activity  . Alcohol use: Never    Frequency: Never  . Drug use: Never  . Sexual activity: Not on file  Lifestyle  . Physical activity:    Days per week: Not on file    Minutes per session: Not on file  . Stress: Not on file  Relationships  . Social connections:    Talks on phone: Not on file    Gets together: Not on file    Attends religious service: Not on file    Active member of club or organization: Not on file    Attends meetings of clubs or organizations: Not on file    Relationship status: Not on file  . Intimate partner violence:    Fear of current or ex partner: Not on file    Emotionally abused: Not on file    Physically abused: Not on file    Forced sexual activity: Not on file  Other Topics Concern  . Not on file  Social History Narrative  . Not on file   Family History  Problem Relation Age of Onset  . Hypertension Mother   . Diabetes Maternal Grandmother   . Colon cancer Neg Hx   . Esophageal cancer Neg Hx   . Rectal cancer Neg Hx   . Inflammatory bowel disease Neg Hx   . Liver disease Neg Hx   . Pancreatic cancer Neg Hx    I have reviewed his medical, social, and family history in detail and updated the electronic medical record as necessary.    PHYSICAL EXAMINATION  BP 110/72   Pulse 76   Ht 5\' 5"  (1.651 m)   Wt 138 lb (62.6 kg)   BMI 22.96 kg/m  Wt Readings from Last 3 Encounters:  12/12/17 138 lb (62.6 kg)  11/22/17 142 lb (64.4 kg)  10/05/16 131 lb 12.8 oz (59.8 kg)  GEN: NAD, appears stated age, doesn't appear chronically ill PSYCH: Cooperative, without pressured  speech EYE: Conjunctivae pink, sclerae anicteric ENT: MMM, without oral ulcers, no erythema or exudates noted NECK: Supple CV: RR without R/Gs  RESP: Wheezing appreciated GI: NABS, soft, NT/ND, without rebound or guarding, no HSM appreciated MSK/EXT: No LE edema SKIN: No jaundice NEURO:  Alert & Oriented x 3, no focal deficits   REVIEW OF DATA  I reviewed the following data at the time of this encounter:  GI Procedures and Studies  Reported history of colonoscopy 5-years ago and need to get records  Laboratory Studies  Reviewed in epic  Imaging  Studies  No relevant studies   ASSESSMENT  Mr. Gadson is a 58 y.o. male with a pmh significant for COPD/Asthma, HTN, OA.  The patient is seen today for evaluation and management of:  1. Colon cancer screening   2. History of colonic polyps    This is a patient who is clinically stable.  We will try to obtain the patient's colonoscopy records to review the number of polyps as well as type of polyps that he had.  If he truly has adenomatous polyps he will be due for colonoscopy.  If he has only hyperplastic polyps that he may not be indicated for follow-up as of yet and completely asymptomatic patient.  With that being said if he cannot get the records we will proceed with a repeat colonoscopy because of the reported history of polyps.  Patient will try to get these records and we will also and plan accordingly.  The patient has a normal blood count as of the labs from just 3 weeks ago so I do not see an indication for evaluation for iron deficiency anemia.  All patient questions were answered, to the best of my ability, and the patient agrees to the aforementioned plan of action with follow-up as indicated.   PLAN  1. Colon cancer screening - Try and get records to see colonoscopy if unable then plan to repeat colonoscopy  2. History of colonic polyps   No orders of the defined types were placed in this encounter.   New Prescriptions    No medications on file   Modified Medications   No medications on file    Planned Follow Up: No follow-ups on file.   Corliss Parish, MD [facility] Office # [phone]

## 2017-12-12 NOTE — Patient Instructions (Signed)
Normal BMI (Body Mass Index- based on height and weight) is between 19 and 25. Your BMI today is Body mass index is 22.96 kg/m. Marland Kitchen Please consider follow up  regarding your BMI with your Primary Care Provider.  Thank you for entrusting me with your care and choosing [facility].  Dr Meridee Score

## 2017-12-17 ENCOUNTER — Encounter: Payer: Self-pay | Admitting: Gastroenterology

## 2017-12-18 DIAGNOSIS — Z1211 Encounter for screening for malignant neoplasm of colon: Secondary | ICD-10-CM | POA: Insufficient documentation

## 2017-12-18 DIAGNOSIS — Z86010 Personal history of colonic polyps: Secondary | ICD-10-CM | POA: Insufficient documentation

## 2018-01-08 ENCOUNTER — Ambulatory Visit: Payer: Medicaid Other | Admitting: Internal Medicine

## 2018-01-09 ENCOUNTER — Ambulatory Visit (HOSPITAL_COMMUNITY)
Admission: EM | Admit: 2018-01-09 | Discharge: 2018-01-09 | Disposition: A | Discharge status: Home / Self Care | Payer: Medicaid Other | Attending: Family Medicine | Admitting: Family Medicine

## 2018-01-09 ENCOUNTER — Other Ambulatory Visit: Payer: Self-pay

## 2018-01-09 ENCOUNTER — Encounter (HOSPITAL_COMMUNITY): Payer: Self-pay | Admitting: Emergency Medicine

## 2018-01-09 DIAGNOSIS — L0291 Cutaneous abscess, unspecified: Secondary | ICD-10-CM

## 2018-01-09 MED ORDER — DOXYCYCLINE HYCLATE 100 MG PO CAPS: 100.0000 mg | ORAL_CAPSULE | Freq: Two times a day (BID) | ORAL | 0 refills | Status: AC

## 2018-01-09 NOTE — ED Provider Notes (Signed)
[facility]    CSN: [id number] Arrival date & time: 01/09/18  0844     History   Chief Complaint Chief Complaint  Patient presents with  . Abscess    HPI Glenn [name] is a 57 y.o. male history of hypertension, COPD, asthma presenting today for evaluation of swelling to left ear.  Patient states that he has had a keloid there for the past years.  Of recently in the past 3 days he has noticed increased pain and swelling.  He did try to pop it and drained [employer], but swelling and pain have worsened since.  Denies any fevers.  Denies pain inside the ear.  Denies difficulty moving neck.  Denies any vision changes.  HPI  Past Medical History:  Diagnosis Date  . Arthritis   . Asthma   . COPD (chronic obstructive pulmonary disease) (HCC)   . ED (erectile dysfunction)   . Hypertension     Patient Active Problem List   Diagnosis Date Noted  . Colon cancer screening 12/18/2017  . History of colonic polyps 12/18/2017  . Cigarette smoker 11/24/2017  . 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 COPD GOLD II  10/05/2016    Past Surgical History:  Procedure Laterality Date  . LUNG SURGERY     removed tissue       Home Medications    Prior to Admission medications   Medication Sig Start Date End Date Taking? Authorizing Provider  albuterol (PROVENTIL HFA) 108 (90 Base) MCG/ACT inhaler Inhale 2 puffs into the lungs every 6 (six) hours as needed for wheezing or shortness of breath. 11/22/17   [name], MD  albuterol (PROVENTIL) (2.5 MG/3ML) 0.083% nebulizer solution Take 3 mLs (2.5 mg total) by nebulization every 6 (six) hours as needed for wheezing or shortness of breath. 11/22/17   [name], MD  amLODipine (NORVASC) 10 MG tablet Take 10 mg by mouth daily.    [provider]  aspirin EC 81 MG tablet Take 81 mg by mouth daily.    [provider]  doxycycline (VIBRAMYCIN) 100 MG capsule Take 1 capsule (100 mg total) by mouth 2 (two) times  daily for 10 days. 01/09/18 01/19/18  [name], PA-C  gabapentin (NEURONTIN) 300 MG capsule Take 300 mg by mouth 3 (three) times daily.    [provider]  mometasone-formoterol (DULERA) 200-5 MCG/ACT AERO Inhale 2 puffs into the lungs 2 (two) times daily. 11/22/17   [name], MD  nitroGLYCERIN (NITROSTAT) 0.4 MG SL tablet Place 0.4 mg under the tongue every 5 (five) minutes as needed for chest pain.    [provider]  oxyCODONE (ROXICODONE) 15 MG immediate release tablet Take 15 mg by mouth every 4 (four) hours as needed for pain.    [provider]  sildenafil (VIAGRA) 100 MG tablet Take 100 mg by mouth daily as needed for erectile dysfunction.    [provider]  traMADol (ULTRAM) 50 MG tablet Take 50 mg by mouth every 12 (twelve) hours as needed.    [provider]  valACYclovir (VALTREX) 1000 MG tablet Take 1 tablet (1,000 mg total) by mouth 2 (two) times daily. 10/09/17   [name], MD  Vitamin D, Ergocalciferol, (DRISDOL) 50000 units CAPS capsule Take 50,000 Units by mouth every 7 (seven) days.    [provider]    Family History Family History  Problem Relation Age of Onset  . Hypertension Mother   . Diabetes Maternal Grandmother   .  Colon cancer Neg Hx   . Esophageal cancer Neg Hx   . Rectal cancer Neg Hx   . Inflammatory bowel disease Neg Hx   . Liver disease Neg Hx   . Pancreatic cancer Neg Hx     Social History Social History   Tobacco Use  . Smoking status: Current Every Day Smoker    Packs/day: 0.50    Years: 34.00    Pack years: 17.00    Types: Cigarettes  . Smokeless tobacco: Never Used  . Tobacco comment: tobacco given today 12/12/17  Substance Use Topics  . Alcohol use: Never    Frequency: Never  . Drug use: Never     Allergies   Shrimp [shellfish allergy] and Lisinopril   Review of Systems Review of Systems  Constitutional: Negative for fatigue and fever.  Eyes: Negative for  redness, itching and visual disturbance.  Respiratory: Negative for shortness of breath.   Cardiovascular: Negative for chest pain and leg swelling.  Gastrointestinal: Negative for nausea and vomiting.  Musculoskeletal: Negative for arthralgias and myalgias.  Skin: Positive for color change and wound. Negative for rash.  Neurological: Negative for dizziness, syncope, weakness, light-headedness and headaches.     Physical Exam Triage Vital Signs ED Triage Vitals [01/09/18 0939]  Enc Vitals Group     BP      Pulse      Resp      Temp      Temp src      SpO2      Weight      Height      Head Circumference      Peak Flow      Pain Score 6     Pain Loc      Pain Edu?      Excl. in GC?    No data found.  Updated Vital Signs There were no vitals taken for this visit.  Visual Acuity Right Eye Distance:   Left Eye Distance:   Bilateral Distance:    Right Eye Near:   Left Eye Near:    Bilateral Near:     Physical Exam  Constitutional: He is oriented to person, place, and time. He appears well-developed and well-nourished.  No acute distress  HENT:  Head: Normocephalic and atraumatic.  Nose: Nose normal.  Bilateral ears without tenderness to palpation of external auricle, tragus and mastoid, EAC's without erythema or swelling, TM's with good bony landmarks and cone of light. Non erythematous.  Oral mucosa pink and moist, no tonsillar enlargement or exudate. Posterior pharynx patent and nonerythematous, no uvula deviation or swelling. Normal phonation.   Eyes: Conjunctivae are normal.  Neck: Neck supple.  Cardiovascular: Normal rate.  Pulmonary/Chest: Effort normal. No respiratory distress.  Abdominal: He exhibits no distension.  Musculoskeletal: Normal range of motion.  Neurological: He is alert and oriented to person, place, and time.  Skin: Skin is warm and dry.  2 to 3 cm swelling anterior to left ear, slight erythema, tender to touch, 2 pinpoint black lesions    Psychiatric: He has a normal mood and affect.  Nursing note and vitals reviewed.    UC Treatments / Results  Labs (all labs ordered are listed, but only abnormal results are displayed) Labs Reviewed - No data to display  EKG None  Radiology No results found.  Procedures Procedures (including critical care time)  Medications Ordered in UC Medications - No data to display  Initial Impression / Assessment and Plan / UC Course  I have reviewed the triage vital signs and the nursing notes.  Pertinent labs & imaging results that were available during my care of the patient were reviewed by me and considered in my medical decision making (see chart for details).     Patient with abscess versus infected cyst given black specks on swelling.  Discussed antibiotics versus I&D.  Patient states he does not have time for drainage today as he needs to pick someone up.  Opted for antibiotics and monitoring over the next few days, will provide doxycycline.  Warm compresses.  Follow-up in 2 to 3 days if symptoms worsening and not seeing any improvement with antibiotics.Discussed strict return precautions. Patient verbalized understanding and is agreeable with plan.  Final Clinical Impressions(s) / UC Diagnoses   Final diagnoses:  Abscess     Discharge Instructions     Please begin doxycycline for 10 days  Apply warm compresses/hot rags to area with massage  Return if symptoms returning or not improving   ED Prescriptions    Medication Sig Dispense Auth. Provider   doxycycline (VIBRAMYCIN) 100 MG capsule Take 1 capsule (100 mg total) by mouth 2 (two) times daily for 10 days. 20 capsule Lona Six C, PA-C     Controlled Substance Prescriptions  Controlled Substance Registry consulted? Not Applicable   Lew Dawes, [place] 01/09/18 1036

## 2018-01-09 NOTE — ED Triage Notes (Signed)
PT has a swollen area in front of left ear. PT was able to drain "brown stuff" out of it three days ago. It is now more swollen.

## 2018-01-09 NOTE — Discharge Instructions (Signed)
Please begin doxycycline for 10 days ° °Apply warm compresses/hot rags to area with massage ° °Return if symptoms returning or not improving °

## 2018-01-10 ENCOUNTER — Encounter (HOSPITAL_COMMUNITY): Payer: Self-pay

## 2018-01-10 ENCOUNTER — Ambulatory Visit (HOSPITAL_COMMUNITY)
Admission: EM | Admit: 2018-01-10 | Discharge: 2018-01-10 | Disposition: A | Discharge status: Home / Self Care | Payer: Medicaid Other | Attending: Family Medicine | Admitting: Family Medicine

## 2018-01-10 DIAGNOSIS — H6002 Abscess of left external ear: Secondary | ICD-10-CM | POA: Diagnosis not present

## 2018-01-10 DIAGNOSIS — L0291 Cutaneous abscess, unspecified: Secondary | ICD-10-CM

## 2018-01-10 NOTE — ED Provider Notes (Signed)
[facility]    CSN: [id number] Arrival date & time: 01/10/18  [id number]     History   Chief Complaint Chief Complaint  Patient presents with  . Abscess    HPI Glenn [name] is a 57 y.o. male.   This is a follow-up visit for this 57 year old man who presented several days ago with an abscess by the left tragus of his ear.  He did not have time to have the area lanced so he was put on antibiotics and told to come back when he had more time.     Past Medical History:  Diagnosis Date  . Arthritis   . Asthma   . COPD (chronic obstructive pulmonary disease) (HCC)   . ED (erectile dysfunction)   . Hypertension     Patient Active Problem List   Diagnosis Date Noted  . Colon cancer screening 12/18/2017  . History of colonic polyps 12/18/2017  . Cigarette smoker 11/24/2017  . 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 COPD GOLD II  10/05/2016    Past Surgical History:  Procedure Laterality Date  . LUNG SURGERY     removed tissue       Home Medications    Prior to Admission medications   Medication Sig Start Date End Date Taking? Authorizing Provider  albuterol (PROVENTIL HFA) 108 (90 Base) MCG/ACT inhaler Inhale 2 puffs into the lungs every 6 (six) hours as needed for wheezing or shortness of breath. 11/22/17   Nyoka CowdenWert, Michael B, MD  albuterol (PROVENTIL) (2.5 MG/3ML) 0.083% nebulizer solution Take 3 mLs (2.5 mg total) by nebulization every 6 (six) hours as needed for wheezing or shortness of breath. 11/22/17   Nyoka CowdenWert, Michael B, MD  amLODipine (NORVASC) 10 MG tablet Take 10 mg by mouth daily.    [provider]  aspirin EC 81 MG tablet Take 81 mg by mouth daily.    [provider]  doxycycline (VIBRAMYCIN) 100 MG capsule Take 1 capsule (100 mg total) by mouth 2 (two) times daily for 10 days. 01/09/18 01/19/18  Wieters, Hallie C, PA-C  gabapentin (NEURONTIN) 300 MG capsule Take 300 mg by mouth 3 (three) times daily.    [provider]    mometasone-formoterol (DULERA) 200-5 MCG/ACT AERO Inhale 2 puffs into the lungs 2 (two) times daily. 11/22/17   Nyoka CowdenWert, Michael B, MD  nitroGLYCERIN (NITROSTAT) 0.4 MG SL tablet Place 0.4 mg under the tongue every 5 (five) minutes as needed for chest pain.    [provider]  oxyCODONE (ROXICODONE) 15 MG immediate release tablet Take 15 mg by mouth every 4 (four) hours as needed for pain.    [provider]  sildenafil (VIAGRA) 100 MG tablet Take 100 mg by mouth daily as needed for erectile dysfunction.    [provider]  traMADol (ULTRAM) 50 MG tablet Take 50 mg by mouth every 12 (twelve) hours as needed.    [provider]  valACYclovir (VALTREX) 1000 MG tablet Take 1 tablet (1,000 mg total) by mouth 2 (two) times daily. 10/09/17   Mardella LaymanHagler, Brian, MD  Vitamin D, Ergocalciferol, (DRISDOL) 50000 units CAPS capsule Take 50,000 Units by mouth every 7 (seven) days.    [provider]    Family History Family History  Problem Relation Age of Onset  . Hypertension Mother   . Diabetes Maternal Grandmother   . Colon cancer Neg Hx   . Esophageal cancer Neg Hx   . Rectal cancer Neg Hx   . Inflammatory bowel disease  Neg Hx   . Liver disease Neg Hx   . Pancreatic cancer Neg Hx     Social History Social History   Tobacco Use  . Smoking status: Current Every Day Smoker    Packs/day: 0.50    Years: 34.00    Pack years: 17.00    Types: Cigarettes  . Smokeless tobacco: Never Used  . Tobacco comment: tobacco given today 12/12/17  Substance Use Topics  . Alcohol use: Never    Frequency: Never  . Drug use: Never     Allergies   Ibuprofen; Shrimp [shellfish allergy]; Tylenol [acetaminophen]; and Lisinopril   Review of Systems Review of Systems   Physical Exam Triage Vital Signs ED Triage Vitals  Enc Vitals Group     BP 01/10/18 1016 140/84     Pulse Rate 01/10/18 1016 88     Resp 01/10/18 1016 20     Temp 01/10/18 1016 97.8 F (36.6 C)      Temp Source 01/10/18 1016 Oral     SpO2 01/10/18 1016 97 %     Weight --      Height --      Head Circumference --      Peak Flow --      Pain Score 01/10/18 1019 8     Pain Loc --      Pain Edu? --      Excl. in GC? --    No data found.  Updated Vital Signs BP 140/84 (BP Location: Right Arm)   Pulse 88   Temp 97.8 F (36.6 C) (Oral)   Resp 20   SpO2 97%    Physical Exam  Constitutional: He is oriented to person, place, and time. He appears well-developed and well-nourished.  HENT:  Left pretragal abscess measures 0.7 x 1 cm, fluctuant   Eyes: Conjunctivae are normal.  Neck: Normal range of motion. Neck supple.  Pulmonary/Chest: Effort normal.  Musculoskeletal: Normal range of motion.  Neurological: He is alert and oriented to person, place, and time.  Skin: Skin is warm and dry.  Nursing note and vitals reviewed.    UC Treatments / Results  Labs (all labs ordered are listed, but only abnormal results are displayed) Labs Reviewed - No data to display  EKG None  Radiology No results found.  Procedures Incision and Drainage Date/Time: 01/10/2018 10:48 AM Performed by: Elvina Sidle, MD Authorized by: Elvina Sidle, MD   Consent:    Consent obtained:  Verbal   Consent given by:  Patient   Risks discussed:  Incomplete drainage and infection   Alternatives discussed:  Delayed treatment Location:    Type:  Abscess   Location:  Head   Head location:  Face Pre-procedure details:    Skin preparation:  Betadine Anesthesia (see MAR for exact dosages):    Anesthesia method:  Local infiltration   Local anesthetic:  Lidocaine 2% WITH epi Procedure type:    Complexity:  Simple Procedure details:    Needle aspiration: no     Incision types:  Stab incision   Incision depth:  Subcutaneous   Scalpel blade:  11   Wound management:  Probed and deloculated   Drainage:  Bloody and purulent   Drainage amount:  Moderate   Packing materials:   None Post-procedure details:    Patient tolerance of procedure:  Tolerated well, no immediate complications   (including critical care time)  Medications Ordered in UC Medications - No data to display  Initial Impression /  Assessment and Plan / UC Course  I have reviewed the triage vital signs and the nursing notes.  Pertinent labs & imaging results that were available during my care of the patient were reviewed by me and considered in my medical decision making (see chart for details).    Final Clinical Impressions(s) / UC Diagnoses   Final diagnoses:  Abscess     Discharge Instructions     Continue you antibiotics.  Use warm soapy water compresses next 1-2 days.  Return if the abscess comes back.    ED Prescriptions    None     Controlled Substance Prescriptions [place] Controlled Substance Registry consulted? Not Applicable   Elvina Sidle, MD 01/10/18 1050

## 2018-01-10 NOTE — Discharge Instructions (Addendum)
Continue you antibiotics.  Use warm soapy water compresses next 1-2 days.  Return if the abscess comes back.

## 2018-01-10 NOTE — ED Triage Notes (Signed)
Pt presents with abscess on left side of face by left ear.

## 2018-02-08 ENCOUNTER — Ambulatory Visit (INDEPENDENT_AMBULATORY_CARE_PROVIDER_SITE_OTHER): Payer: Medicaid Other | Admitting: Internal Medicine

## 2018-02-08 ENCOUNTER — Encounter: Payer: Self-pay | Admitting: Internal Medicine

## 2018-02-08 ENCOUNTER — Ambulatory Visit: Payer: Medicaid Other | Admitting: Internal Medicine

## 2018-02-08 DIAGNOSIS — J449 Chronic obstructive pulmonary disease, unspecified: Secondary | ICD-10-CM

## 2018-02-08 DIAGNOSIS — F17210 Nicotine dependence, cigarettes, uncomplicated: Secondary | ICD-10-CM

## 2018-02-08 LAB — PULMONARY FUNCTION TEST
DL/VA % pred: 106 %
DL/VA: 4.48 ml/min/mmHg/L
DLCO UNC: 12.71 ml/min/mmHg
DLCO unc % pred: 52 %
FEF 25-75 Pre: 0.43 L/sec
FEF2575-%Pred-Pre: 17 %
FEV1-%PRED-PRE: 36 %
FEV1-Pre: 0.9 L
FEV1FVC-%PRED-PRE: 74 %
FEV6-%Pred-Pre: 50 %
FEV6-Pre: 1.53 L
FEV6FVC-%Pred-Pre: 104 %
FVC-%PRED-PRE: 48 %
FVC-PRE: 1.53 L
PRE FEV1/FVC RATIO: 59 %
Pre FEV6/FVC Ratio: 100 %
RV % pred: 115 %
RV: 2.16 L
TLC % PRED: 67 %
TLC: 3.9 L

## 2018-02-08 MED ORDER — TIOTROPIUM BROMIDE MONOHYDRATE 2.5 MCG/ACT IN AERS: 2.0000 | INHALATION_SPRAY | Freq: Every day | RESPIRATORY_TRACT | 0 refills | Status: DC

## 2018-02-08 MED ORDER — PREDNISONE 10 MG PO TABS: ORAL_TABLET | ORAL | 0 refills | Status: DC

## 2018-02-08 MED ORDER — TIOTROPIUM BROMIDE MONOHYDRATE 2.5 MCG/ACT IN AERS: 2.0000 | INHALATION_SPRAY | Freq: Every day | RESPIRATORY_TRACT | 11 refills | Status: DC

## 2018-02-08 NOTE — Progress Notes (Signed)
Patient had an albuterol nebulizer treatment at 8am and 2 puffs of albuterol inhaler at 9am prior to testing this morning. Pt advised he had a cold with cough. Therefore unable to complete the post spiro of the PFT today.  Pt only complete 1 out of three tries of the DLCO. Pt was unable to take a fast deep breath to pass the DLCO.

## 2018-02-08 NOTE — Progress Notes (Signed)
Subjective:     Patient ID: Glenn [name], male   DOB: 06/14/1960     MRN: [id number]      Brief patient profile:  22 yobm active smoker  with onset of symptoms in 1995 while in [place] dx as asthma vs sarcoid and rx pred/ theoph / albuterol seen by allergist dx as pollen moved to [place] where underwent bilateral lung surgery 2011 ? Lung vol reduction ? Helped breathing at the time but uses [facility] parking and  [facility] = can't walk a nl pace on a flat grade s sob but does fine slow and flat so referred to pulmonary clinic 10/05/2016 by Dr  Tyson Dense      History of Present Illness  10/05/2016 1st  Pulmonary office visit/ Glenn [name]   Chief Complaint  Patient presents with  . Pulmonary Consult    Referred by Dr. Alveta Heimlich for eval of Asthma and COPD. Pt states he was dxed with COPD and Asthma in 1995. He moved here a year ago from [place], [place]. He states his breathing is "okay" today. He states he has a "slight cold"- prod cough with minimal brown sputum.    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  Work on Printmaker B = Backup Only use your albuterol (proair) as a rescue medication Plan C = Crisis - only use your albuterol nebulizer if you first try Plan B and it fails to help > ok to use the nebulizer up to every 4 hours but if start needing it regularly call for immediate appointment Please schedule a follow up office visit in 6 weeks, call sooner if needed with pfts     11/22/2017  f/u ov/Glenn [name] re:  GOLD II/ still smoking   Chief Complaint  Patient presents with  . Follow-up    Breathing is overall doing okay. He is wheezing and has occ non prod cough. He is using his albuterol inhaler and neb both 3-4 x daily.   Dyspnea:  MMRC2 = can't walk a nl pace on a flat grade s sob but does fine slow and flat  Cough: mostly dry, worse in house where he thinks there is mold Sleeping: some wheezing noct/ freq saba noct rec Prednisone 10 mg take  4 each am x 2 days,   2 each am x 2 days,  1 each am x 2 days and stop  Plan A = Automatic = dulera 200 Take 2 puffs first thing in am and then another 2 puffs about 12 hours later.  Work on inhaler technique: Plan B = Backup Only use your albuterol (proair) as a rescue medication  Plan C = Crisis - only use your albuterol nebulizer if you first try Plan B  The key is to stop smoking completely before smoking completely stops you!      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 Follow-up    Pt had PFT prior to ov today.  Dyspnea:  MMRC2 = can't walk a nl pace on a flat grade s sob but does fine slow and flat / worse with cold x one week Cough: more with cold, not producing any mucus  Sleeping: 2 pillows, bed flat SABA use: when not having a cold  Maybe once a day 02: none    No obvious day to day or daytime variability or assoc excess/ purulent sputum or mucus plugs or hemoptysis or cp or chest tightness, subjective wheeze or overt sinus or hb symptoms.   Sleeping as above  without nocturnal  or early am exacerbation  of respiratory  c/o's or need for noct saba. Also denies any obvious fluctuation of symptoms with weather or environmental changes or other aggravating or alleviating factors except as outlined above   No unusual exposure hx or h/o childhood pna/ asthma or knowledge of premature birth.  Current Allergies, Complete Past Medical History, Past Surgical History,  Family History, and Social History were reviewed in [facility] electronic medical record.  ROS  The following are not active complaints unless bolded Hoarseness, sore throat, dysphagia, dental problems, itching, sneezing,  nasal congestion or discharge of excess mucus or purulent secretions, ear ache,   fever, chills, sweats, unintended wt loss or wt gain, classically pleuritic or exertional cp,  orthopnea pnd or arm/hand swelling  or leg swelling, presyncope, palpitations, abdominal pain, anorexia, nausea, vomiting, diarrhea  or change in bowel habits or change in bladder habits, change in stools or change in urine, dysuria, hematuria,  rash, arthralgias, visual complaints, headache, numbness, weakness or ataxia or problems with walking or coordination,  change in mood or  memory.        Current Meds  Medication Sig  . albuterol (PROVENTIL HFA) 108 (90 Base) MCG/ACT inhaler Inhale 2 puffs into the lungs every 6 (six) hours as needed for wheezing or shortness of breath.  Marland Kitchen. albuterol (PROVENTIL) (2.5 MG/3ML) 0.083% nebulizer solution Take 3 mLs (2.5 mg total) by nebulization every 6 (six) hours as needed for wheezing or shortness of breath.  Marland Kitchen. amLODipine (NORVASC) 10 MG tablet Take 10 mg by mouth daily.  Marland Kitchen. aspirin EC 81 MG tablet Take 81 mg by mouth daily.  Marland Kitchen. gabapentin (NEURONTIN) 300 MG capsule Take 300 mg by mouth 3 (three) times daily.  . mometasone-formoterol (DULERA) 200-5 MCG/ACT AERO Inhale 2 puffs into the lungs 2 (two) times daily.  . nitroGLYCERIN (NITROSTAT) 0.4 MG SL tablet Place 0.4 mg under the tongue every 5 (five) minutes as needed for chest pain.  Marland Kitchen. oxyCODONE (ROXICODONE) 15 MG immediate release tablet Take 15 mg by mouth every 4 (four) hours as needed for pain.  . sildenafil (VIAGRA) 100 MG tablet Take 100 mg by mouth daily as needed for erectile dysfunction.  . traMADol (ULTRAM) 50 MG tablet Take 50 mg by mouth every 12 (twelve) hours as needed.  . valACYclovir (VALTREX)  1000 MG tablet Take 1 tablet (1,000 mg total) by mouth 2 (two) times daily.  . Vitamin D, Ergocalciferol, (DRISDOL) 50000 units CAPS capsule Take 50,000 Units by mouth every 7 (seven) days.               Objective:   Physical Exam    Thin amb bm nad   02/08/2018  143  11/22/17 142 lb (64.4 kg)  10/05/16 131 lb 12.8 oz (59.8 kg)      Vital signs reviewed - Note on arrival 02 sats  93% on RA and pulse 130 p multiple saba    HEENT: nl dentition / oropharynx. Nl external ear canals without cough reflex -  Mild bilateral non-specific turbinate edema     NECK :  without JVD/Nodes/TM/ nl carotid upstrokes bilaterally   LUNGS: no acc muscle use,  Mod barrel  contour chest wall with bilateral  Distant bs s audible wheeze and  without cough on insp or exp maneuver and mod  Hyperresonant  to  percussion bilaterally     CV:  RRR  no s3 or murmur or increase in P2, and no edema   ABD:  soft and nontender with pos mid insp Hoover's  in the supine position. No bruits or organomegaly appreciated, bowel sounds nl  MS:   Nl gait/  ext warm without deformities, calf tenderness, cyanosis or clubbing No obvious joint restrictions   SKIN: warm and dry without lesions    NEURO:  alert, approp, nl sensorium with  no motor or cerebellar deficits apparent.                           Assessment:

## 2018-02-08 NOTE — Patient Instructions (Addendum)
Prednisone 10 mg take  4 each am x 2 days,   2 each am x 2 days,  1 each am x 2 days and stop   Plan A = Automatic = dulera 200 Take 2 puffs first thing in am and then another 2 puffs about 12 hours later  And spiriva 2 puffs each am   Work on inhaler technique:  relax and gently blow all the way out then take a nice smooth deep breath back in, triggering the inhaler at same time you start breathing in.  Hold for up to 5 seconds if you can. Blow out thru nose. Rinse and gargle with water when done   Plan B = Backup Only use your albuterol (proair= Ventolin)  as a rescue medication to be used if you can't catch your breath by resting or doing a relaxed purse lip breathing pattern.  - The less you use it, the better it will work when you need it. - Ok to use the inhaler up to 2 puffs  every 4 hours if you must but call for appointment if use goes up over your usual need - Don't leave home without it !!  (think of it like the spare tire for your car)   Plan C = Crisis - only use your albuterol nebulizer if you first try Plan B and it fails to help > ok to use the nebulizer up to every 4 hours but if start needing it regularly call for immediate appointment   The key is to stop smoking completely before smoking completely stops you!    Please schedule a follow up visit in 3 months but call sooner if needed

## 2018-02-09 ENCOUNTER — Encounter (HOSPITAL_COMMUNITY): Payer: Self-pay | Admitting: Emergency Medicine

## 2018-02-09 ENCOUNTER — Ambulatory Visit (HOSPITAL_COMMUNITY)
Admission: EM | Admit: 2018-02-09 | Discharge: 2018-02-09 | Disposition: A | Discharge status: Home / Self Care | Payer: Medicaid Other | Attending: Family Medicine | Admitting: Family Medicine

## 2018-02-09 DIAGNOSIS — B029 Zoster without complications: Secondary | ICD-10-CM | POA: Insufficient documentation

## 2018-02-09 MED ORDER — VALACYCLOVIR HCL 1 G PO TABS: 1000.0000 mg | ORAL_TABLET | Freq: Three times a day (TID) | ORAL | 0 refills | Status: DC

## 2018-02-09 MED ORDER — GABAPENTIN 300 MG PO CAPS: 300.0000 mg | ORAL_CAPSULE | Freq: Three times a day (TID) | ORAL | 0 refills | Status: DC

## 2018-02-09 NOTE — ED Provider Notes (Signed)
[facility]    CSN: [id number] Arrival date & time: 02/09/18  1947     History   Chief Complaint Chief Complaint  Patient presents with  . Rash    HPI Glenn [name] is a 58 y.o. male.   HPI Rash has developed over the left shoulder and anterior chest since yesterday.  It is uncomfortable but not painful.  Past Medical History:  Diagnosis Date  . Arthritis   . Asthma   . COPD (chronic obstructive pulmonary disease) (HCC)   . ED (erectile dysfunction)   . Hypertension     Patient Active Problem List   Diagnosis Date Noted  . Colon cancer screening 12/18/2017  . History of colonic polyps 12/18/2017  . Cigarette smoker 11/24/2017  . 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 COPD GOLD II  10/05/2016    Past Surgical History:  Procedure Laterality Date  . LUNG SURGERY     removed tissue       Home Medications    Prior to Admission medications   Medication Sig Start Date End Date Taking? Authorizing Provider  albuterol (PROVENTIL HFA) 108 (90 Base) MCG/ACT inhaler Inhale 2 puffs into the lungs every 6 (six) hours as needed for wheezing or shortness of breath. 11/22/17   [name], MD  albuterol (PROVENTIL) (2.5 MG/3ML) 0.083% nebulizer solution Take 3 mLs (2.5 mg total) by nebulization every 6 (six) hours as needed for wheezing or shortness of breath. 11/22/17   [name], MD  amLODipine (NORVASC) 10 MG tablet Take 10 mg by mouth daily.    [provider]  aspirin EC 81 MG tablet Take 81 mg by mouth daily.    [provider]  gabapentin (NEURONTIN) 300 MG capsule Take 300 mg by mouth 3 (three) times daily.    [provider]  gabapentin (NEURONTIN) 300 MG capsule Take 1 capsule (300 mg total) by mouth 3 (three) times daily. 02/09/18   Eustace Moore, MD  mometasone-formoterol [facility]) 200-5 MCG/ACT AERO Inhale 2 puffs into the lungs 2 (two) times daily. 11/22/17   [name], MD  nitroGLYCERIN (NITROSTAT) 0.4 MG SL  tablet Place 0.4 mg under the tongue every 5 (five) minutes as needed for chest pain.    [provider]  oxyCODONE (ROXICODONE) 15 MG immediate release tablet Take 15 mg by mouth every 4 (four) hours as needed for pain.    [provider]  sildenafil (VIAGRA) 100 MG tablet Take 100 mg by mouth daily as needed for erectile dysfunction.    [provider]  Tiotropium Bromide Monohydrate (SPIRIVA RESPIMAT) 2.5 MCG/ACT AERS Inhale 2 puffs into the lungs daily. 02/08/18   [name], MD  traMADol (ULTRAM) 50 MG tablet Take 50 mg by mouth every 12 (twelve) hours as needed.    [provider]  valACYclovir (VALTREX) 1000 MG tablet Take 1 tablet (1,000 mg total) by mouth 3 (three) times daily. 02/09/18   Eustace Moore, MD  Vitamin D, Ergocalciferol, (DRISDOL) 50000 units CAPS capsule Take 50,000 Units by mouth every 7 (seven) days.    [provider]    Family History Family History  Problem Relation Age of Onset  . Hypertension Mother   . Diabetes Maternal Grandmother   . Colon cancer Neg Hx   . Esophageal cancer Neg Hx   . Rectal cancer Neg Hx   . Inflammatory bowel disease Neg Hx   . Liver disease Neg Hx   . Pancreatic cancer  Neg Hx     Social History Social History   Tobacco Use  . Smoking status: Current Every Day Smoker    Packs/day: 0.50    Years: 34.00    Pack years: 17.00    Types: Cigarettes  . Smokeless tobacco: Never Used  . Tobacco comment: tobacco given today 12/12/17  Substance Use Topics  . Alcohol use: Never    Frequency: Never  . Drug use: Never     Allergies   Ibuprofen; Shrimp [shellfish allergy]; Tylenol [acetaminophen]; and Lisinopril   Review of Systems Review of Systems  Constitutional: Negative for chills and fever.  HENT: Negative for ear pain and sore throat.   Eyes: Negative for pain and visual disturbance.  Respiratory: Negative for cough and shortness of breath.   Cardiovascular: Negative for  chest pain and palpitations.  Gastrointestinal: Negative for abdominal pain and vomiting.  Genitourinary: Negative for dysuria and hematuria.  Musculoskeletal: Negative for arthralgias and back pain.  Skin: Positive for rash. Negative for color change.  Neurological: Negative for seizures and syncope.  All other systems reviewed and are negative.    Physical Exam Triage Vital Signs ED Triage Vitals  Enc Vitals Group     BP 02/09/18 2008 (!) 156/100     Pulse Rate 02/09/18 2008 (!) 105     Resp 02/09/18 2008 20     Temp 02/09/18 2008 98.6 F (37 C)     Temp Source 02/09/18 2008 Oral     SpO2 02/09/18 2008 95 %     Weight --      Height --      Head Circumference --      Peak Flow --      Pain Score 02/09/18 2009 5     Pain Loc --      Pain Edu? --      Excl. in GC? --    No data found.  Updated Vital Signs BP (!) 156/100 (BP Location: Left Arm)   Pulse (!) 105   Temp 98.6 F (37 C) (Oral)   Resp 20   SpO2 95%       Physical Exam Constitutional:      General: He is not in acute distress.    Appearance: He is well-developed.  HENT:     Head: Normocephalic and atraumatic.  Eyes:     Conjunctiva/sclera: Conjunctivae normal.     Pupils: Pupils are equal, round, and reactive to light.  Neck:     Musculoskeletal: Normal range of motion.  Cardiovascular:     Rate and Rhythm: Normal rate.  Pulmonary:     Effort: Pulmonary effort is normal. No respiratory distress.  Abdominal:     General: There is no distension.     Palpations: Abdomen is soft.  Musculoskeletal: Normal range of motion.       Arms:  Skin:    General: Skin is warm and dry.  Neurological:     Mental Status: He is alert.      UC Treatments / Results  Labs (all labs ordered are listed, but only abnormal results are displayed) Labs Reviewed - No data to display  EKG None  Radiology No results found.  Procedures Procedures (including critical care time)  Medications Ordered in  UC Medications - No data to display  Initial Impression / Assessment and Plan / UC Course  I have reviewed the triage vital signs and the nursing notes.  Pertinent labs & imaging results that were available during  my care of the patient were reviewed by me and considered in my medical decision making (see chart for details).      Final Clinical Impressions(s) / UC Diagnoses   Final diagnoses:  Herpes zoster without complication     Discharge Instructions     Shingles is an outbreak of the chickenpox virus that is stored in your body. It breaks out in a nerve distribution Need to take the valacyclovir 3 times a day for 7 days.  This is an antiviral medication Take gabapentin for nerve pain.  If you are already on gabapentin you can increase your gabapentin.  It is usually taken 3 times a day.  Caution drowsiness Do not rub or pick at the rash. Read shingles instructions Return as needed Caution contagiousness around anyone who might have decreased immunity   ED Prescriptions    Medication Sig Dispense Auth. Provider   valACYclovir (VALTREX) 1000 MG tablet Take 1 tablet (1,000 mg total) by mouth 3 (three) times daily. 21 tablet Eustace Moore, MD   gabapentin (NEURONTIN) 300 MG capsule Take 1 capsule (300 mg total) by mouth 3 (three) times daily. 50 capsule Eustace Moore, MD     Controlled Substance Prescriptions [place] Controlled Substance Registry consulted? Not Applicable   Eustace Moore, MD 02/09/18 2040

## 2018-02-09 NOTE — ED Triage Notes (Signed)
Pt presents to [facility] for assessment of rash to top of left shoulder developing yesterday.  Today becoming painful and itchy.

## 2018-02-09 NOTE — Discharge Instructions (Signed)
Shingles is an outbreak of the chickenpox virus that is stored in your body. It breaks out in a nerve distribution Need to take the valacyclovir 3 times a day for 7 days.  This is an antiviral medication Take gabapentin for nerve pain.  If you are already on gabapentin you can increase your gabapentin.  It is usually taken 3 times a day.  Caution drowsiness Do not rub or pick at the rash. Read shingles instructions Return as needed Caution contagiousness around anyone who might have decreased immunity

## 2018-02-11 ENCOUNTER — Encounter: Payer: Self-pay | Admitting: Internal Medicine

## 2018-02-11 NOTE — Assessment & Plan Note (Addendum)
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    In meantime we can always provide samples so the patient never runs out of any needed respiratory medications.     >>> f/u q 3 m , sooner prn

## 2018-02-11 NOTE — Assessment & Plan Note (Signed)

## 2018-02-19 ENCOUNTER — Other Ambulatory Visit: Payer: Self-pay | Admitting: Internal Medicine

## 2018-02-19 MED ORDER — MOMETASONE FURO-FORMOTEROL FUM 200-5 MCG/ACT IN AERO: 2.0000 | INHALATION_SPRAY | Freq: Two times a day (BID) | RESPIRATORY_TRACT | 2 refills | Status: DC

## 2018-02-26 ENCOUNTER — Other Ambulatory Visit: Payer: Self-pay | Admitting: Internal Medicine

## 2018-05-04 MED FILL — SILDENAFIL CITRATE 100 MG T: 100 | 30 days supply | Qty: 30 | Fill #0

## 2018-05-10 ENCOUNTER — Ambulatory Visit: Payer: Medicaid Other | Admitting: Internal Medicine

## 2018-05-27 ENCOUNTER — Other Ambulatory Visit: Payer: Self-pay | Admitting: Internal Medicine

## 2018-06-14 MED FILL — SILDENAFIL CITRATE 100 MG T: 100 | 30 days supply | Qty: 30 | Fill #1

## 2018-06-20 ENCOUNTER — Telehealth: Payer: Self-pay | Admitting: Internal Medicine

## 2018-06-20 DIAGNOSIS — J45909 Unspecified asthma, uncomplicated: Secondary | ICD-10-CM

## 2018-06-20 NOTE — Telephone Encounter (Signed)
Order placed to [facility] for nebulizer. Nothing further needed once signed by MD.  MW please sign order for nebulizer that was missed at 02/08/18 office visit.

## 2018-06-20 NOTE — Telephone Encounter (Signed)
Done

## 2018-06-20 NOTE — Telephone Encounter (Addendum)
Patient came to office today requesting a neb machine from [facility].  Patient was last seen by MW, 02/08/18, for COPD. Per MW instructions, Patient should have neb machine. No DME referral ordered.  Last OV instructions-     Instructions   Prednisone 10 mg take  4 each am x 2 days,   2 each am x 2 days,  1 each am x 2 days and stop   Plan A = Automatic = dulera 200 Take 2 puffs first thing in am and then another 2 puffs about 12 hours later  And spiriva 2 puffs each am   Work on inhaler technique:  relax and gently blow all the way out then take a nice smooth deep breath back in, triggering the inhaler at same time you start breathing in.  Hold for up to 5 seconds if you can. Blow out thru nose. Rinse and gargle with water when done   Plan B = Backup Only use your albuterol (proair= Ventolin)  as a rescue medication to be used if you can't catch your breath by resting or doing a relaxed purse lip breathing pattern.  - The less you use it, the better it will work when you need it. - Ok to use the inhaler up to 2 puffs  every 4 hours if you must but call for appointment if use goes up over your usual need - Don't leave home without it !!  (think of it like the spare tire for your car)   Plan C = Crisis - only use your albuterol nebulizer if you first try Plan B and it fails to help > ok to use the nebulizer up to every 4 hours but if start needing it regularly call for immediate appointment   The key is to stop smoking completely before smoking completely stops you!    Please schedule a follow up visit in 3 months but call sooner if needed        Message routed to triage

## 2018-08-13 MED FILL — SILDENAFIL CITRATE 100 MG T: 100 | 30 days supply | Qty: 30 | Fill #2

## 2018-08-14 ENCOUNTER — Ambulatory Visit (INDEPENDENT_AMBULATORY_CARE_PROVIDER_SITE_OTHER): Payer: Medicaid Other | Admitting: Internal Medicine

## 2018-08-14 ENCOUNTER — Encounter: Payer: Self-pay | Admitting: Internal Medicine

## 2018-08-14 ENCOUNTER — Other Ambulatory Visit: Payer: Self-pay

## 2018-08-14 DIAGNOSIS — J449 Chronic obstructive pulmonary disease, unspecified: Secondary | ICD-10-CM

## 2018-08-14 DIAGNOSIS — F17210 Nicotine dependence, cigarettes, uncomplicated: Secondary | ICD-10-CM | POA: Diagnosis not present

## 2018-08-14 MED ORDER — TRELEGY ELLIPTA 100-62.5-25 MCG/INH IN AEPB: 1.0000 | INHALATION_SPRAY | Freq: Every day | RESPIRATORY_TRACT | 11 refills | Status: DC

## 2018-08-14 MED ORDER — ALBUTEROL SULFATE HFA 108 (90 BASE) MCG/ACT IN AERS: 2.0000 | INHALATION_SPRAY | RESPIRATORY_TRACT | 11 refills | Status: DC | PRN

## 2018-08-14 MED ORDER — TRELEGY ELLIPTA 100-62.5-25 MCG/INH IN AEPB: 1.0000 | INHALATION_SPRAY | Freq: Every day | RESPIRATORY_TRACT | 0 refills | Status: DC

## 2018-08-14 MED ORDER — PREDNISONE 10 MG PO TABS: ORAL_TABLET | ORAL | 0 refills | Status: DC

## 2018-08-14 NOTE — Progress Notes (Signed)
Subjective:     Patient ID: Glenn [name], male   DOB: 08-01-1960     MRN: [id number]      Brief patient profile:  9 yobm active smoker  with onset of symptoms in 1995 while in [place] dx as asthma vs sarcoid and rx pred/ theoph / albuterol seen by allergist dx as pollen moved to [place] where underwent bilateral lung surgery 2011 ? Lung vol reduction ? Helped breathing at the time but uses [facility] parking and  [facility] = can't walk a nl pace on a flat grade s sob but does fine slow and flat so referred to pulmonary clinic 10/05/2016 by Dr  Tyson Dense      History of Present Illness  10/05/2016 1st [facility] office visit/ Lizania Bouchard   Chief Complaint  Patient presents with  . Pulmonary Consult    Referred by Dr. Alveta Heimlich for eval of Asthma and COPD. Pt states he was dxed with COPD and Asthma in 1995. He moved here a year ago from [place], [place]. He states his breathing is "okay" today. He states he has a "slight cold"- prod cough with minimal brown sputum.    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  Work on Printmaker B = Backup Only use your albuterol (proair) as a rescue medication Plan C = Crisis - only use your albuterol nebulizer if you first try Plan B and it fails to help > ok to use the nebulizer up to every 4 hours but if start needing it regularly call for immediate appointment Please schedule a follow up office visit in 6 weeks, call sooner if needed with pfts     11/22/2017  f/u ov/Joylyn Duggin re:  GOLD II/ still smoking   Chief Complaint  Patient presents with  . Follow-up    Breathing is overall doing okay. He is wheezing and has occ non prod cough. He is using his albuterol inhaler and neb both 3-4 x daily.   Dyspnea:  MMRC2 = can't walk a nl pace on a flat grade s sob but does fine slow and flat  Cough: mostly dry, worse in house where he thinks there is mold Sleeping: some wheezing noct/ freq saba noct rec Prednisone 10 mg take  4 each am x 2 days,   2 each am x 2 days,  1 each am x 2 days and stop  Plan A = Automatic = dulera 200 Take 2 puffs first thing in am and then another 2 puffs about 12 hours later.  Work on inhaler technique: Plan B = Backup Only use your albuterol (proair) as a rescue medication  Plan C = Crisis - only use your albuterol nebulizer if you first try Plan B  The key is to stop smoking completely before smoking completely stops you!      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 Follow-up    Pt had PFT prior to ov today.  Dyspnea:  MMRC2 = can't walk a nl pace on a flat grade s sob but does fine slow and flat / worse with cold x one week Cough: more with cold, not producing any mucus  Sleeping: 2 pillows, bed flat SABA use: when not having a cold  Maybe once a day 02: none  rec Prednisone 10 mg take  4 each am x 2 days,   2 each am x 2 days,  1 each am x 2 days and stop  Plan A = Automatic = dulera 200 Take 2 puffs first thing in am and then another 2 puffs about 12 hours later  And spiriva 2 puffs each am  Work on inhaler technique: Plan B = Backup Only use your albuterol (proair= Ventolin)  as a rescue medication Plan C = Crisis - only use your albuterol nebulizer if you first try Plan B and it fails to help > ok to use the nebulizer up to every 4 hours but if start needing it regularly call for immediate appointment The key is to stop smoking completely before smoking  completely stops you!    08/14/2018  f/u ov/Osiris Odriscoll re: GOLD II/ active smoking/ on dulera 200/ spiriva  Chief Complaint  Patient presents with  . Follow-up    Breathing is some worse with hot/humid weather. He states he does not have a rescue inhaler. He uses his neb 3 x daily on average.   Dyspnea:  MMRC2 = can't walk a nl pace on a flat grade s sob but does fine slow and flat  Cough: min mucoid esp in am Sleeping: bed is flat, one pillow / rarely wakes up needs neb  SABA use: way too much neb saba ,no hfa on hand  02: none    No obvious day to day or daytime variability or assoc excess/ purulent sputum or mucus plugs or hemoptysis or cp or chest tightness, subjective wheeze or overt sinus or hb symptoms.     Also denies any obvious fluctuation of symptoms with weather or environmental changes or other aggravating or alleviating factors except as outlined above   No unusual exposure hx or h/o childhood pna/ asthma or knowledge of premature birth.  Current Allergies, Complete Past Medical History, Past Surgical History, Family History, and Social History were reviewed in [facility] electronic medical record.  ROS  The following are not active complaints unless bolded Hoarseness, sore throat, dysphagia, dental problems, itching, sneezing,  nasal congestion or discharge of excess mucus or purulent secretions, ear ache,   fever, chills, sweats, unintended wt loss or wt gain, classically pleuritic or exertional cp,  orthopnea pnd or arm/hand swelling  or leg swelling, presyncope, palpitations, abdominal pain, anorexia, nausea, vomiting, diarrhea  or change in bowel habits or change in bladder habits, change in stools or change in urine, dysuria, hematuria,  rash, arthralgias, visual complaints, headache, numbness, weakness or ataxia or problems with walking or coordination,  change in mood or  memory.        Current Meds  Medication Sig  . albuterol (PROVENTIL HFA) 108 (90 Base) MCG/ACT  inhaler Inhale 2 puffs into the lungs every 6 (six) hours as needed for wheezing or shortness of breath.  Marland Kitchen. albuterol (PROVENTIL) (2.5 MG/3ML) 0.083% nebulizer solution PLEASE  SEE ATTACHED FOR DETAILED DIRECTIONS  . amLODipine (NORVASC) 10 MG tablet Take 10 mg by mouth daily.  Marland Kitchen aspirin EC 81 MG tablet Take 81 mg by mouth daily.  . DULERA 200-5 MCG/ACT AERO TAKE 2 PUFFS BY MOUTH TWICE A DAY  . gabapentin (NEURONTIN) 300 MG capsule Take 1 capsule (300 mg total) by mouth 3 (three) times daily.  . nitroGLYCERIN (NITROSTAT) 0.4 MG SL tablet Place 0.4 mg under the tongue every 5 (five) minutes as needed for chest pain.  . Oxycodone HCl 20 MG TABS Take 1 tablet by mouth every 4 (four) hours as needed.  . sildenafil (VIAGRA) 100 MG tablet Take 100 mg by mouth daily as needed for erectile dysfunction.  . traMADol (ULTRAM) 50 MG tablet Take 50 mg by mouth every 12 (twelve) hours as needed.  . valACYclovir (VALTREX) 1000 MG tablet Take 1 tablet (1,000 mg total) by mouth 3 (three) times daily.  . Vitamin D, Ergocalciferol, (DRISDOL) 50000 units CAPS capsule Take 50,000 Units by mouth every 7 (seven) days.  . [DISCONTINUED] Tiotropium Bromide Monohydrate (SPIRIVA RESPIMAT) 2.5 MCG/ACT AERS Inhale 2 puffs into the lungs daily.             Objective:   Physical Exam    Thin amb bm nad   08/14/2018          144  02/08/2018          143  11/22/17 142 lb (64.4 kg)  10/05/16 131 lb 12.8 oz (59.8 kg)      Vital signs reviewed - Note on arrival 02 sats  95% on RA      HEENT: nl dentition / oropharynx. Nl external ear canals without cough reflex -  Mild bilateral non-specific turbinate edema     NECK :  without JVD/Nodes/TM/ nl carotid upstrokes bilaterally   LUNGS: no acc muscle use,  Mod barrel  contour chest wall with bilateral  Distant bs s audible wheeze and  without cough on insp or exp maneuver and mod  Hyperresonant  to  percussion bilaterally     CV:  RRR  no s3 or murmur or increase in  P2, and no edema   ABD:  soft and nontender with pos mid insp Hoover's  in the supine position. No bruits or organomegaly appreciated, bowel sounds nl  MS:     ext warm without deformities, calf tenderness, cyanosis or clubbing No obvious joint restrictions   SKIN: warm and dry without lesions    NEURO:  alert, approp, nl sensorium with  no motor or cerebellar deficits apparent.               Assessment:

## 2018-08-14 NOTE — Patient Instructions (Addendum)
Plan A = Automatic = trelegy one click each am, take two good drags   Plan B = Backup Only use your albuterol inhaler as a rescue medication to be used if you can't catch your breath by resting or doing a relaxed purse lip breathing pattern.  - The less you use it, the better it will work when you need it. - Ok to use the inhaler up to 2 puffs  every 4 hours if you must but call for appointment if use goes up over your usual need - Don't leave home without it !!  (think of it like the spare tire for your car)   Plan C = Crisis - only use your albuterol nebulizer if you first try Plan B and it fails to help > ok to use the nebulizer up to every 4 hours but if start needing it regularly call for immediate appointment  Prednisone 10 mg take  4 each am x 2 days,   2 each am x 2 days,  1 each am x 2 days and stop   The key is to stop smoking completely before smoking completely stops you!  For smoking cessation classes call [phone]       Please schedule a follow up visit in 3 months but call sooner if needed  with all medications /inhalers/ solutions in hand so we can verify exactly what you are taking. This includes all medications from all doctors and over the counters Add: cxr on return

## 2018-08-15 ENCOUNTER — Encounter: Payer: Self-pay | Admitting: Internal Medicine

## 2018-08-15 NOTE — Assessment & Plan Note (Signed)

## 2018-08-15 NOTE — Assessment & Plan Note (Signed)
Active smoker   - Spirometry 10/05/2016  FEV1 1.36 (52%)  Ratio 65 p saba with classic curvature - 10/05/2016  > try dulera 200 2bid   - Allergy profile 11/22/2017 >  Eos 0.6 /  IgE  25 RAST neg x for roaches/ neg for mold  - 11/22/2017   continue dulera 200 2bid  - PFT's  02/08/2018  FEV1 0.90 (36 % ) ratio 59   p multiple saba prior to study with DLCO  52 % corrects to 106  % for alv volume   02/08/2018  After extensive coaching inhaler device,  effectiveness =    90% from a baseline of 75% with smi so try spiriva respimat  - 08/14/2018  After extensive coaching inhaler device,  effectiveness =    90% with dpi > changed to trelegy   >>>  Clearly due in no small part to active smoking >>  Group D in terms of symptom/risk and laba/lama/ICS  therefore appropriate rx at this point >>>  Try telegy next

## 2018-09-26 MED FILL — SILDENAFIL CITRATE 100 MG T: 100 | 30 days supply | Qty: 30 | Fill #3

## 2018-10-28 ENCOUNTER — Other Ambulatory Visit: Payer: Self-pay

## 2018-10-28 ENCOUNTER — Encounter (HOSPITAL_COMMUNITY): Payer: Self-pay | Admitting: Family Medicine

## 2018-10-28 ENCOUNTER — Ambulatory Visit (HOSPITAL_COMMUNITY)
Admission: EM | Admit: 2018-10-28 | Discharge: 2018-10-28 | Disposition: A | Discharge status: Home / Self Care | Payer: Medicaid Other | Attending: Family Medicine | Admitting: Family Medicine

## 2018-10-28 DIAGNOSIS — B029 Zoster without complications: Secondary | ICD-10-CM

## 2018-10-28 MED ORDER — VALACYCLOVIR HCL 1 G PO TABS: 1000.0000 mg | ORAL_TABLET | Freq: Three times a day (TID) | ORAL | 0 refills | Status: DC

## 2018-10-28 NOTE — ED Triage Notes (Signed)
Pt sts possible insect bite to right side of his face earlier this week that is still irritated

## 2018-10-28 NOTE — ED Provider Notes (Addendum)
[facility]    CSN: [id number] Arrival date & time: 10/28/18  1112      History   Chief Complaint Chief Complaint  Patient presents with  . Insect Bite    HPI Glenn [name] is a 58 y.o. male.   This a 58 year old established most [facility] urgent care patient with chronic problems of COPD, hypertension, and asthma.  He presents today with a rash.  It began his pain on the side of his forehead and developed into a cluster of water blisters of the last 6 days.  Patient has had shingles before.  He has had no fever with this but he has had a headache and a swollen gland at the angle of the jaw.     Past Medical History:  Diagnosis Date  . Arthritis   . Asthma   . COPD (chronic obstructive pulmonary disease) ([place])   . ED (erectile dysfunction)   . Hypertension     Patient Active Problem List   Diagnosis Date Noted  . Colon cancer screening 12/18/2017  . History of colonic polyps 12/18/2017  . Cigarette smoker 11/24/2017  . 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 COPD GOLD III/ actively smoking  10/05/2016    Past Surgical History:  Procedure Laterality Date  . LUNG SURGERY     removed tissue       Home Medications    Prior to Admission medications   Medication Sig Start Date End Date Taking? Authorizing Provider  albuterol (PROVENTIL HFA) 108 (90 Base) MCG/ACT inhaler Inhale 2 puffs into the lungs every 4 (four) hours as needed for wheezing or shortness of breath. 08/14/18   Tanda Rockers, MD  albuterol (PROVENTIL) (2.5 MG/3ML) 0.083% nebulizer solution PLEASE SEE ATTACHED FOR DETAILED DIRECTIONS 02/26/18   Tanda Rockers, MD  amLODipine (NORVASC) 10 MG tablet Take 10 mg by mouth daily.    [provider]  aspirin EC 81 MG tablet Take 81 mg by mouth daily.    [provider]  Fluticasone-Umeclidin-Vilant (TRELEGY ELLIPTA) 100-62.5-25 MCG/INH AEPB Inhale 1 puff into the lungs daily. 08/14/18   Tanda Rockers, MD  gabapentin (NEURONTIN) 300 MG  capsule Take 1 capsule (300 mg total) by mouth 3 (three) times daily. 02/09/18   Raylene Everts, MD  nitroGLYCERIN (NITROSTAT) 0.4 MG SL tablet Place 0.4 mg under the tongue every 5 (five) minutes as needed for chest pain.    [provider]  Oxycodone HCl 20 MG TABS Take 1 tablet by mouth every 4 (four) hours as needed. 07/25/18   [provider]  sildenafil (VIAGRA) 100 MG tablet Take 100 mg by mouth daily as needed for erectile dysfunction.    [provider]  traMADol (ULTRAM) 50 MG tablet Take 50 mg by mouth every 12 (twelve) hours as needed.    [provider]  valACYclovir (VALTREX) 1000 MG tablet Take 1 tablet (1,000 mg total) by mouth 3 (three) times daily. 10/28/18   Robyn Haber, MD  Vitamin D, Ergocalciferol, (DRISDOL) 50000 units CAPS capsule Take 50,000 Units by mouth every 7 (seven) days.    [provider]    Family History Family History  Problem Relation Age of Onset  . Hypertension Mother   . Diabetes Maternal Grandmother   . Colon cancer Neg Hx   . Esophageal cancer Neg Hx   . Rectal cancer Neg Hx   . Inflammatory bowel disease Neg Hx   . Liver disease Neg Hx   . Pancreatic cancer  Neg Hx     Social History Social History   Tobacco Use  . Smoking status: Current Every Day Smoker    Packs/day: 0.50    Years: 34.00    Pack years: 17.00    Types: Cigarettes  . Smokeless tobacco: Never Used  . Tobacco comment: tobacco given today 12/12/17  Substance Use Topics  . Alcohol use: Never    Frequency: Never  . Drug use: Never     Allergies   Ibuprofen, Shrimp [shellfish allergy], Tylenol [acetaminophen], and Lisinopril   Review of Systems Review of Systems  Skin: Positive for rash.  All other systems reviewed and are negative.    Physical Exam Triage Vital Signs ED Triage Vitals  Enc Vitals Group     BP      Pulse      Resp      Temp      Temp src      SpO2      Weight      Height      Head  Circumference      Peak Flow      Pain Score      Pain Loc      Pain Edu?      Excl. in GC?    No data found.  Updated Vital Signs BP (!) 141/96 (BP Location: Left Arm)   Pulse (!) 102   Temp 98.4 F (36.9 C) (Temporal)   Resp 18   SpO2 94%    Physical Exam Vitals signs and nursing note reviewed.  Constitutional:      Appearance: Normal appearance.  Eyes:     Conjunctiva/sclera: Conjunctivae normal.  Neck:     Musculoskeletal: Normal range of motion and neck supple.     Comments: 1/2 cm firm node at angle of jaw on right Pulmonary:     Effort: Pulmonary effort is normal.  Musculoskeletal: Normal range of motion.  Lymphadenopathy:     Cervical: Cervical adenopathy present.  Skin:    General: Skin is warm and dry.     Findings: Erythema and rash present.  Neurological:     General: No focal deficit present.     Mental Status: He is alert.  Psychiatric:        Mood and Affect: Mood normal.        UC Treatments / Results  Labs (all labs ordered are listed, but only abnormal results are displayed) Labs Reviewed - No data to display  EKG   Radiology No results found.  Procedures Procedures (including critical care time)  Medications Ordered in UC Medications - No data to display  Initial Impression / Assessment and Plan / UC Course  I have reviewed the triage vital signs and the nursing notes.  Pertinent labs & imaging results that were available during my care of the patient were reviewed by me and considered in my medical decision making (see chart for details).    Final Clinical Impressions(s) / UC Diagnoses   Final diagnoses:  Herpes zoster without complication   Discharge Instructions   None    ED Prescriptions    Medication Sig Dispense Auth. Provider   valACYclovir (VALTREX) 1000 MG tablet Take 1 tablet (1,000 mg total) by mouth 3 (three) times daily. 21 tablet Elvina SidleLauenstein, Damya Comley, MD     I have reviewed the PDMP during this encounter.    Elvina SidleLauenstein, Aniel Hubble, MD 10/28/18 1145    Elvina SidleLauenstein, Yassir Enis, MD 10/28/18 1147    Elvina SidleLauenstein, Aysel Gilchrest,  MD 10/28/18 1147

## 2018-11-14 ENCOUNTER — Other Ambulatory Visit: Payer: Self-pay | Admitting: Internal Medicine

## 2018-11-14 ENCOUNTER — Ambulatory Visit: Payer: Medicaid Other | Admitting: Internal Medicine

## 2018-11-14 DIAGNOSIS — J449 Chronic obstructive pulmonary disease, unspecified: Secondary | ICD-10-CM

## 2018-11-15 ENCOUNTER — Ambulatory Visit: Payer: Medicaid Other

## 2018-11-15 ENCOUNTER — Ambulatory Visit: Payer: Medicaid Other | Admitting: Internal Medicine

## 2018-11-20 ENCOUNTER — Encounter: Payer: Self-pay | Admitting: Internal Medicine

## 2018-11-20 ENCOUNTER — Other Ambulatory Visit: Payer: Self-pay

## 2018-11-20 ENCOUNTER — Ambulatory Visit (INDEPENDENT_AMBULATORY_CARE_PROVIDER_SITE_OTHER): Payer: Medicaid Other

## 2018-11-20 ENCOUNTER — Ambulatory Visit: Payer: Medicaid Other | Admitting: Internal Medicine

## 2018-11-20 DIAGNOSIS — J449 Chronic obstructive pulmonary disease, unspecified: Secondary | ICD-10-CM

## 2018-11-20 DIAGNOSIS — F17210 Nicotine dependence, cigarettes, uncomplicated: Secondary | ICD-10-CM

## 2018-11-20 MED ORDER — BREZTRI AEROSPHERE 160-9-4.8 MCG/ACT IN AERO: 2.0000 | INHALATION_SPRAY | Freq: Two times a day (BID) | RESPIRATORY_TRACT | 0 refills | Status: DC

## 2018-11-20 MED ORDER — ALBUTEROL SULFATE HFA 108 (90 BASE) MCG/ACT IN AERS: 2.0000 | INHALATION_SPRAY | RESPIRATORY_TRACT | 11 refills | Status: DC | PRN

## 2018-11-20 MED ORDER — PREDNISONE 10 MG PO TABS: ORAL_TABLET | ORAL | 0 refills | Status: DC

## 2018-11-20 MED ORDER — BREZTRI AEROSPHERE 160-9-4.8 MCG/ACT IN AERO: 2.0000 | INHALATION_SPRAY | Freq: Two times a day (BID) | RESPIRATORY_TRACT | 11 refills | Status: DC

## 2018-11-20 NOTE — Patient Instructions (Signed)
Plan A = Automatic = Breztri Take 2 puffs first thing in am and then another 2 puffs about 12 hours later.   Work on inhaler technique:  relax and gently blow all the way out then take a nice smooth deep breath back in, triggering the inhaler at same time you start breathing in.  Hold for up to 5 seconds if you can. Blow out thru nose. Rinse and gargle with water when done  If not happy, resume dulera and spiriva like before    Plan B = Backup Only use your albuterol inhaler as a rescue medication to be used if you can't catch your breath by resting or doing a relaxed purse lip breathing pattern.  - The less you use it, the better it will work when you need it. - Ok to use the inhaler up to 2 puffs  every 4 hours if you must but call for appointment if use goes up over your usual need - Don't leave home without it !!  (think of it like the spare tire for your car)   Plan C = Crisis - only use your albuterol nebulizer if you first try Plan B and it fails to help > ok to use the nebulizer up to every 4 hours but if start needing it regularly call for immediate appointment  Prednisone 10 mg take  4 each am x 2 days,   2 each am x 2 days,  1 each am x 2 days and stop   The key is to stop smoking completely before smoking completely stops you!   Please schedule a follow up office visit in 4 weeks, sooner if needed  with all medications /inhalers/ solutions in hand so we can verify exactly what you are taking. This includes all medications from all doctors and over the counters

## 2018-11-20 NOTE — Progress Notes (Signed)
Subjective:     Patient ID: Glenn [name], male   DOB: 08-01-1960     MRN: [id number]      Brief patient profile:  9 yobm active smoker  with onset of symptoms in 1995 while in [place] dx as asthma vs sarcoid and rx pred/ theoph / albuterol seen by allergist dx as pollen moved to [place] where underwent bilateral lung surgery 2011 ? Lung vol reduction ? Helped breathing at the time but uses [facility] parking and  [facility] = can't walk a nl pace on a flat grade s sob but does fine slow and flat so referred to pulmonary clinic 10/05/2016 by Dr  Tyson Dense      History of Present Illness  10/05/2016 1st [facility] office visit/    Chief Complaint  Patient presents with  . Pulmonary Consult    Referred by Dr. Alveta Heimlich for eval of Asthma and COPD. Pt states he was dxed with COPD and Asthma in 1995. He moved here a year ago from [place], [place]. He states his breathing is "okay" today. He states he has a "slight cold"- prod cough with minimal brown sputum.    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  Work on [employer]maker B = Backup Only use your albuterol (proair) as a rescue medication Plan C = Crisis - only use your albuterol nebulizer if you first try Plan B and it fails to help > ok to use the nebulizer up to every 4 hours but if start needing it regularly call for immediate appointment Please schedule a follow up office visit in 6 weeks, call sooner if needed with pfts     11/22/2017  f/u ov/ re:  GOLD II/ still smoking   Chief Complaint  Patient presents with  . Follow-up    Breathing is overall doing okay. He is wheezing and has occ non prod cough. He is using his albuterol inhaler and neb both 3-4 x daily.   Dyspnea:  MMRC2 = can't walk a nl pace on a flat grade s sob but does fine slow and flat  Cough: mostly dry, worse in house where he thinks there is mold Sleeping: some wheezing noct/ freq saba noct rec Prednisone 10 mg take  4 each am x 2 days,   2 each am x 2 days,  1 each am x 2 days and stop  Plan A = Automatic = dulera 200 Take 2 puffs first thing in am and then another 2 puffs about 12 hours later.  Work on inhaler technique: Plan B = Backup Only use your albuterol (proair) as a rescue medication  Plan C = Crisis - only use your albuterol nebulizer if you first try Plan B  The key is to stop smoking completely before smoking completely stops you!      02/08/2018  f/u ov/  re:  Gold II ->  III copd / still smoking/ over using saba on dulera 200  Chief Complaint  Patient presents with  . Follow-up    Pt had PFT prior to ov today.  Dyspnea:  MMRC2 = can't walk a nl pace on a flat grade s sob but does fine slow and flat / worse with cold x one week Cough: more with cold, not producing any mucus  Sleeping: 2 pillows, bed flat SABA use: when not having a cold  Maybe once a day 02: none  rec Prednisone 10 mg take  4 each am x 2 days,   2 each am x 2 days,  1 each am x 2 days and stop  Plan A = Automatic = dulera 200 Take 2 puffs first thing in am and then another 2 puffs about 12 hours later  And spiriva 2 puffs each am  Work on inhaler technique: Plan B = Backup Only use your albuterol (proair= Ventolin)  as a rescue medication Plan C = Crisis - only use your albuterol nebulizer if you first try Plan B and it fails to help > ok to use the nebulizer up to every 4 hours but if start needing it regularly call for immediate appointment The key is to stop smoking completely before smoking  completely stops you!    08/14/2018  f/u ov/ re: GOLD II/ active smoking/ on dulera 200/ spiriva  Chief Complaint  Patient presents with  . Follow-up    Breathing is some worse with hot/humid weather. He states he does not have a rescue inhaler. He uses his neb 3 x daily on average.   Dyspnea:  MMRC2 = can't walk a nl pace on a flat grade s sob but does fine slow and flat  Cough: min mucoid esp in am Sleeping: bed is flat, one pillow / rarely wakes up needs neb  SABA use: way too much neb saba ,no hfa on hand  02: none  rec Plan A = Automatic = trelegy one click each am, take two good drags  Plan B = Backup Only use your albuterol inhaler as a rescue medication t Plan C = Crisis - only use your albuterol nebulizer if you first try Plan B and it fails to help > ok to use the nebulizer up to every 4 hours but if start needing it regularly call for immediate appointment Prednisone 10 mg take  4 each am x 2 days,   2 each am x 2 days,  1 each am x 2 days and stop  Please schedule a follow up visit in 3 months but call sooner if needed  with all medications /inhalers/ solutions in hand so we can verify exactly what you are taking. This includes all medications from all doctors and over the counters Add: cxr on return     11/20/2018  f/u ov/ re:  Copd/ still smoking and meds empty Chief Complaint  Patient presents with  . Follow-up    Breathing is "okay" but he is using his nebulizer with albuterol 3-4 x per day. He came in today with a empty Dulera inhaler and states this is what he has been using.   Dyspnea:  MMRC2 = can't walk a nl pace on a flat grade s sob but does fine slow and flat Cough: no  Sleeping: ok flat  SABA use: as above -was much less until ran out of maint rx  02: none    No  obvious day to day or daytime variability or assoc excess/ purulent sputum or mucus plugs or hemoptysis or cp or chest tightness, subjective wheeze or overt sinus or hb symptoms.   Sleeping  as above  without nocturnal  or early am exacerbation  of respiratory  c/o's or need for noct saba. Also denies any obvious fluctuation of symptoms with weather or environmental changes or other aggravating or alleviating factors except as outlined above   No unusual exposure hx or h/o childhood pna/ asthma or knowledge of premature birth.  Current Allergies, Complete Past Medical History, Past Surgical History, Family History, and Social History were reviewed in [employer] record.  ROS  The following are not active complaints unless bolded Hoarseness, sore throat, dysphagia, dental problems, itching, sneezing,  nasal congestion or discharge of excess mucus or purulent secretions, ear ache,   fever, chills, sweats, unintended wt loss or wt gain, classically pleuritic or exertional cp,  orthopnea pnd or arm/hand swelling  or leg swelling, presyncope, palpitations, abdominal pain, anorexia, nausea, vomiting, diarrhea  or change in bowel habits or change in bladder habits, change in stools or change in urine, dysuria, hematuria,  rash, arthralgias, visual complaints, headache, numbness, weakness or ataxia or problems with walking or coordination,  change in mood or  memory.        Current Meds  Medication Sig  . albuterol (PROVENTIL HFA) 108 (90 Base) MCG/ACT inhaler Inhale 2 puffs into the lungs every 4 (four) hours as needed for wheezing or shortness of breath.  Marland Kitchen albuterol (PROVENTIL) (2.5 MG/3ML) 0.083% nebulizer solution PLEASE SEE ATTACHED FOR DETAILED DIRECTIONS  . amLODipine (NORVASC) 10 MG tablet Take 10 mg by mouth daily.  Marland Kitchen gabapentin (NEURONTIN) 300 MG capsule Take 1 capsule (300 mg total) by mouth 3 (three) times daily.  . nitroGLYCERIN (NITROSTAT) 0.4 MG SL tablet Place 0.4 mg under the tongue every 5 (five) minutes as needed for chest pain.  . Oxycodone HCl 20 MG TABS Take 1 tablet by mouth every 4 (four) hours as needed.  . sildenafil (VIAGRA) 100 MG tablet Take  100 mg by mouth daily as needed for erectile dysfunction.                 Objective:   Physical Exam    Thin bm nad   11/20/2018      144  08/14/2018          144  02/08/2018          143  11/22/17 142 lb (64.4 kg)  10/05/16 131 lb 12.8 oz (59.8 kg)      Vital signs reviewed - Note on arrival 02 sats  95% on RA       HEENT : pt wearing mask not removed for exam due to covid -19 concerns.    NECK :  without JVD/Nodes/TM/ nl carotid upstrokes bilaterally   LUNGS: no acc muscle use,  Mod barrel  contour chest wall with bilateral  Distant bs with faint exp wheeze and  without cough on insp or exp maneuvers and mod  Hyperresonant  to  percussion bilaterally     CV:  RRR  no s3 or murmur or increase in P2, and no edema   ABD:  soft and nontender with pos mid insp Hoover's  in the supine position. No bruits or organomegaly appreciated, bowel sounds nl  MS:     ext warm without deformities, calf tenderness, cyanosis or clubbing No obvious joint restrictions  SKIN: warm and dry without lesions    NEURO:  alert, approp, nl sensorium with  no motor or cerebellar deficits apparent.             Assessment:

## 2018-11-23 ENCOUNTER — Encounter: Payer: Self-pay | Admitting: Internal Medicine

## 2018-11-23 NOTE — Assessment & Plan Note (Addendum)
Active smoker   - Spirometry 10/05/2016  FEV1 1.36 (52%)  Ratio 65 p saba with classic curvature - 10/05/2016  > try dulera 200 2bid   - Allergy profile 11/22/2017 >  Eos 0.6 /  IgE  25 RAST neg x for roaches/ neg for mold  - 11/22/2017   continue dulera 200 2bid  - PFT's  02/08/2018  FEV1 0.90 (36 % ) ratio 59   p multiple saba prior to study with DLCO  52 % corrects to 106  % for alv volume   02/08/2018  After extensive coaching inhaler device,  effectiveness =    90% from a baseline of 75% with smi so try spiriva respimat - 08/14/2018    changed to trelegy but preferred symb/spriva - 11/20/2018  After extensive coaching inhaler device,  effectiveness =   75% try  Breztri  Overusing saba now that out of maint rx and risking tachyphylaxis > Prednisone 10 mg take  4 each am x 2 days,   2 each am x 2 days,  1 each am x 2 days and stop to "reset"  Beta receptors   Group D in terms of symptom/risk and laba/lama/ICS  therefore appropriate rx at this point >>>  Try breztri samples  Advised:  formulary restrictions will be an ongoing challenge for the forseable future and I would be happy to pick an alternative if the pt will first  provide me a list of them -  pt  will need to return here for training for any new device that is required eg dpi vs hfa vs respimat.    In the meantime we can always provide samples so that the patient never runs out of any needed respiratory medications.   I had an extended discussion with the patient reviewing all relevant studies completed to date and  lasting 15 to 20 minutes of a 25 minute visit    I performed detailed device teaching using a teach back method which extended face to face time for this visit (see above)  Each maintenance medication was reviewed in detail including emphasizing most importantly the difference between maintenance and prns and under what circumstances the prns are to be triggered using an action plan format that is not reflected in the computer  generated alphabetically organized AVS which I have not found useful in most complex patients, especially with respiratory illnesses  Please see AVS for specific instructions unique to this visit that I personally wrote and verbalized to the the pt in detail and then reviewed with pt  by my nurse highlighting any  changes in therapy recommended at today's visit to their plan of care.

## 2018-11-23 NOTE — Assessment & Plan Note (Signed)
4-5 min discussion re active cigarette smoking in addition to office E&M  Ask about tobacco use:   ongoing Advise quitting    Discussed the risks and costs (both direct and indirect)  of smoking relative to the benefits of quitting but patient unwilling to commit at this point to a specific quit date.  Although I certainly  don't endorse regular use of e cigs   I emphasized they should be considered a quick "one-way bridge" off all tobacco products and that he only use the commercial brands in the very short term and stop immediately if any resp symptoms worsen  Assess willingness:  ? Really  committed at this point Assist in quit attempt:  Per PCP if not able to transition to e cigs then off   Arrange follow up:   Follow up per Primary Care planned  For smoking cessation classes call [phone]

## 2018-12-18 ENCOUNTER — Ambulatory Visit: Payer: Medicaid Other | Admitting: Internal Medicine

## 2019-01-05 ENCOUNTER — Other Ambulatory Visit: Payer: Self-pay | Admitting: Internal Medicine

## 2019-03-06 ENCOUNTER — Ambulatory Visit (INDEPENDENT_AMBULATORY_CARE_PROVIDER_SITE_OTHER): Payer: Medicaid Other

## 2019-03-06 ENCOUNTER — Ambulatory Visit (HOSPITAL_COMMUNITY)
Admission: EM | Admit: 2019-03-06 | Discharge: 2019-03-06 | Disposition: A | Discharge status: Home / Self Care | Payer: Medicaid Other | Attending: Family Medicine | Admitting: Family Medicine

## 2019-03-06 ENCOUNTER — Encounter (HOSPITAL_COMMUNITY): Payer: Self-pay | Admitting: Emergency Medicine

## 2019-03-06 ENCOUNTER — Other Ambulatory Visit: Payer: Self-pay

## 2019-03-06 DIAGNOSIS — J441 Chronic obstructive pulmonary disease with (acute) exacerbation: Secondary | ICD-10-CM

## 2019-03-06 LAB — POC SARS CORONAVIRUS 2 AG -  ED: SARS Coronavirus 2 Ag: NEGATIVE

## 2019-03-06 LAB — POC SARS CORONAVIRUS 2 AG: SARS Coronavirus 2 Ag: NEGATIVE

## 2019-03-06 MED ORDER — PREDNISONE 10 MG PO TABS: 40.0000 mg | ORAL_TABLET | Freq: Every day | ORAL | 0 refills | Status: DC

## 2019-03-06 MED ORDER — ALBUTEROL SULFATE (2.5 MG/3ML) 0.083% IN NEBU: 2.5000 mg | INHALATION_SOLUTION | Freq: Four times a day (QID) | RESPIRATORY_TRACT | 12 refills | Status: DC | PRN

## 2019-03-06 MED ORDER — BENZONATATE 100 MG PO CAPS: 100.0000 mg | ORAL_CAPSULE | Freq: Three times a day (TID) | ORAL | 0 refills | Status: DC

## 2019-03-06 NOTE — ED Triage Notes (Signed)
Shortness of breath, cough, runny nose for 4 days. History COPD

## 2019-03-06 NOTE — Discharge Instructions (Signed)
Treating you for a COPD exacerbation.  Your rapid Covid test was negative here. Treating with prednisone daily for the next 5 days, Tessalon Perles for cough Sending you home with a nebulizer machine to use as needed Follow up as needed for continued or worsening symptoms

## 2019-03-07 NOTE — ED Provider Notes (Signed)
[facility]    CSN: [id number] Arrival date & time: 03/06/19  1312      History   Chief Complaint Chief Complaint  Patient presents with  . Shortness of Breath    HPI Glenn [name] is a 59 y.o. male.   Patient is a 59 year old male past medical history of arthritis, asthma, COPD, hypertension.  He presents today with cough, mild shortness of breath, nasal congestion and rhinorrhea x4 days.  Symptoms have been constant.  Has been using his inhaler with some relief.  Reporting that her granddaughter has had similar symptoms and was concerned about Covid.  No fever, chills, body aches, night sweats, diarrhea or loss of taste or smell.  No recent traveling or history of DVT/PE.  ROS per HPI    Shortness of Breath   Past Medical History:  Diagnosis Date  . Arthritis   . Asthma   . COPD (chronic obstructive pulmonary disease) (HCC)   . ED (erectile dysfunction)   . Hypertension     Patient Active Problem List   Diagnosis Date Noted  . Colon cancer screening 12/18/2017  . History of colonic polyps 12/18/2017  . Cigarette smoker 11/24/2017  . 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 COPD GOLD III/ actively smoking  10/05/2016    Past Surgical History:  Procedure Laterality Date  . LUNG SURGERY     removed tissue       Home Medications    Prior to Admission medications   Medication Sig Start Date End Date Taking? Authorizing Provider  albuterol (PROVENTIL HFA) 108 (90 Base) MCG/ACT inhaler Inhale 2 puffs into the lungs every 4 (four) hours as needed for wheezing or shortness of breath. 11/20/18   [name], MD  albuterol (PROVENTIL) (2.5 MG/3ML) 0.083% nebulizer solution PLEASE SEE ATTACHED FOR DETAILED DIRECTIONS 02/26/18   [name], MD  albuterol (PROVENTIL) (2.5 MG/3ML) 0.083% nebulizer solution Take 3 mLs (2.5 mg total) by nebulization every 6 (six) hours as needed for wheezing or shortness of breath. 03/06/19   Paraskevi Funez A, NP  amLODipine  (NORVASC) 10 MG tablet Take 10 mg by mouth daily.    [provider]  benzonatate (TESSALON) 100 MG capsule Take 1 capsule (100 mg total) by mouth every 8 (eight) hours. 03/06/19   Gaylan Fauver, Gloris Manchester A, NP  Budeson-Glycopyrrol-Formoterol (BREZTRI AEROSPHERE) 160-9-4.8 MCG/ACT AERO Inhale 2 puffs into the lungs 2 (two) times daily. 11/20/18   [name], MD  DULERA 200-5 MCG/ACT AERO TAKE 2 PUFFS BY MOUTH TWICE A DAY 01/07/19   [name], MD  gabapentin (NEURONTIN) 300 MG capsule Take 1 capsule (300 mg total) by mouth 3 (three) times daily. 02/09/18   Eustace Moore, MD  nitroGLYCERIN (NITROSTAT) 0.4 MG SL tablet Place 0.4 mg under the tongue every 5 (five) minutes as needed for chest pain.    [provider]  Oxycodone HCl 20 MG TABS Take 1 tablet by mouth every 4 (four) hours as needed. 07/25/18   [provider]  predniSONE (DELTASONE) 10 MG tablet Take 4 tablets (40 mg total) by mouth daily for 5 days. 03/06/19 03/11/19  Dahlia Byes A, NP  sildenafil (VIAGRA) 100 MG tablet Take 100 mg by mouth daily as needed for erectile dysfunction.    [provider]    Family History Family History  Problem Relation Age of Onset  . Hypertension Mother   . Diabetes Maternal Grandmother   . Colon cancer Neg Hx   . Esophageal  cancer Neg Hx   . Rectal cancer Neg Hx   . Inflammatory bowel disease Neg Hx   . Liver disease Neg Hx   . Pancreatic cancer Neg Hx     Social History Social History   Tobacco Use  . Smoking status: Current Every Day Smoker    Packs/day: 0.50    Years: 34.00    Pack years: 17.00    Types: Cigarettes  . Smokeless tobacco: Never Used  . Tobacco comment: tobacco given today 12/12/17  Substance Use Topics  . Alcohol use: Never  . Drug use: Never     Allergies   Ibuprofen, Shrimp [shellfish allergy], Tylenol [acetaminophen], and Lisinopril   Review of Systems Review of Systems  Respiratory: Positive for shortness of breath.       Physical Exam Triage Vital Signs ED Triage Vitals  Enc Vitals Group     BP 03/06/19 1518 (!) 156/96     Pulse Rate 03/06/19 1518 (!) 103     Resp 03/06/19 1518 20     Temp 03/06/19 1518 98.2 F (36.8 C)     Temp Source 03/06/19 1518 Oral     SpO2 03/06/19 1518 91 %     Weight --      Height --      Head Circumference --      Peak Flow --      Pain Score 03/06/19 1519 4     Pain Loc --      Pain Edu? --      Excl. in GC? --    No data found.  Updated Vital Signs BP (!) 156/96   Pulse (!) 103   Temp 98.2 F (36.8 C) (Oral)   Resp 20   SpO2 91%   Visual Acuity Right Eye Distance:   Left Eye Distance:   Bilateral Distance:    Right Eye Near:   Left Eye Near:    Bilateral Near:     Physical Exam Vitals and nursing note reviewed.  Constitutional:      General: He is not in acute distress.    Appearance: He is well-developed. He is not ill-appearing, toxic-appearing or diaphoretic.  HENT:     Head: Normocephalic and atraumatic.     Nose: Nose normal.     Mouth/Throat:     Pharynx: Oropharynx is clear.  Eyes:     Conjunctiva/sclera: Conjunctivae normal.  Cardiovascular:     Rate and Rhythm: Normal rate and regular rhythm.     Pulses: Normal pulses.  Pulmonary:     Effort: Pulmonary effort is normal.     Breath sounds: Decreased breath sounds and wheezing present.  Musculoskeletal:        General: Normal range of motion.     Cervical back: Normal range of motion.  Skin:    General: Skin is warm and dry.  Neurological:     Mental Status: He is alert.  Psychiatric:        Mood and Affect: Mood normal.      UC Treatments / Results  Labs (all labs ordered are listed, but only abnormal results are displayed) Labs Reviewed  POC SARS CORONAVIRUS 2 AG -  ED  POC SARS CORONAVIRUS 2 AG    EKG   Radiology DG Chest 2 View  Result Date: 03/06/2019 CLINICAL DATA:  Cough wheezing sarcoid EXAM: CHEST - 2 VIEW COMPARISON:  12/07/2018 FINDINGS: No acute  opacity, pleural effusion or pneumothorax. Right infrahilar scarring as before. Bilateral perihilar  postsurgical changes and scarring. Stable cardiomediastinal silhouette. No pneumothorax. IMPRESSION: No active cardiopulmonary disease. Stable scarring and postsurgical changes bilaterally. Electronically Signed   By: Donavan Foil M.D.   On: 03/06/2019 15:49    Procedures Procedures (including critical care time)  Medications Ordered in UC Medications - No data to display  Initial Impression / Assessment and Plan / UC Course  I have reviewed the triage vital signs and the nursing notes.  Pertinent labs & imaging results that were available during my care of the patient were reviewed by me and considered in my medical decision making (see chart for details).     COPD exacerbation-x-ray without any acute abnormalities today.  Rapid Covid test negative. We will treat with prednisone daily for the next 5 days, Tessalon Perles for cough and have him use his inhaler as needed. Sent home with nebulizer machine to use as needed Follow up as needed for continued or worsening symptoms  Final Clinical Impressions(s) / UC Diagnoses   Final diagnoses:  COPD exacerbation [facility])     Discharge Instructions     Treating you for a COPD exacerbation.  Your rapid Covid test was negative here. Treating with prednisone daily for the next 5 days, Tessalon Perles for cough Sending you home with a nebulizer machine to use as needed Follow up as needed for continued or worsening symptoms     ED Prescriptions    Medication Sig Dispense Auth. Provider   predniSONE (DELTASONE) 10 MG tablet Take 4 tablets (40 mg total) by mouth daily for 5 days. 20 tablet Aniqua Briere A, NP   benzonatate (TESSALON) 100 MG capsule Take 1 capsule (100 mg total) by mouth every 8 (eight) hours. 21 capsule Britnie Colville A, NP   albuterol (PROVENTIL) (2.5 MG/3ML) 0.083% nebulizer solution Take 3 mLs (2.5 mg total) by nebulization  every 6 (six) hours as needed for wheezing or shortness of breath. 75 mL Shashwat Cleary A, NP     PDMP not reviewed this encounter.   Orvan July, NP 03/07/19 1245

## 2019-03-10 ENCOUNTER — Encounter (HOSPITAL_COMMUNITY): Payer: Self-pay

## 2019-03-10 ENCOUNTER — Other Ambulatory Visit: Payer: Self-pay

## 2019-03-10 ENCOUNTER — Ambulatory Visit (HOSPITAL_COMMUNITY)
Admission: EM | Admit: 2019-03-10 | Discharge: 2019-03-10 | Disposition: A | Discharge status: Home / Self Care | Payer: Medicaid Other | Attending: Family Medicine | Admitting: Family Medicine

## 2019-03-10 DIAGNOSIS — R05 Cough: Secondary | ICD-10-CM | POA: Diagnosis not present

## 2019-03-10 DIAGNOSIS — R0789 Other chest pain: Secondary | ICD-10-CM

## 2019-03-10 DIAGNOSIS — R059 Cough, unspecified: Secondary | ICD-10-CM

## 2019-03-10 DIAGNOSIS — J441 Chronic obstructive pulmonary disease with (acute) exacerbation: Secondary | ICD-10-CM

## 2019-03-10 MED ORDER — AZITHROMYCIN 250 MG PO TABS: ORAL_TABLET | ORAL | 0 refills | Status: AC

## 2019-03-10 MED ORDER — PREDNISONE 10 MG (21) PO TBPK: ORAL_TABLET | Freq: Every day | ORAL | 0 refills | Status: DC

## 2019-03-10 NOTE — Discharge Instructions (Signed)
Brace with a  pillow, use of ice or heat to your chest to help with pain.  I have sent an additional steroid taper to start once you complete previously provided.  Complete course of antibiotics as prescribed today.  Your chest xray was normal on 1/27 which is reassuring today.  Negative for covid-19 as well.  Please follow up with your pulmonologist for recheck if symptoms persist.  If worsening of pain, shortness of breath , difficulty breathing, calf pain, please go to the ER.

## 2019-03-10 NOTE — ED Provider Notes (Signed)
[facility]    CSN: [id number] Arrival date & time: 03/10/19  1206      History   Chief Complaint Chief Complaint  Patient presents with  . Cough    HPI Glenn [name] is a 59 y.o. male.   Glenn [name] presents with complaints of cough. Was seen for similar on 1/27 and given prednisone to help with cough. Cough has improved some, but now with rib pain associated with coughing. Rib pain is worsening. Cough started initially 1 week ago. He had travelled to [place] just prior to onset of symptoms. No calf pain or swelling. No fevers. No shortness of breath. No production with cough. Using inhalers, which do help. Hasn't noted any wheezing. Has a nebulizer he uses intermittently as well. History of COPD. No nasal congestion, no sore throat, no ear pain, no headaches, no body aches. Negative covid on 1/27 and stable/ chronic/unchanged chest xray on 1/27. He states that he feels his cough has improved some overall but now with the right distal anterior chest wall pain. He has oxycodone at home for prn use.     ROS per HPI, negative if not otherwise mentioned.      Past Medical History:  Diagnosis Date  . Arthritis   . Asthma   . COPD (chronic obstructive pulmonary disease) (HCC)   . ED (erectile dysfunction)   . Hypertension     Patient Active Problem List   Diagnosis Date Noted  . Colon cancer screening 12/18/2017  . History of colonic polyps 12/18/2017  . Cigarette smoker 11/24/2017  . 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 COPD GOLD III/ actively smoking  10/05/2016    Past Surgical History:  Procedure Laterality Date  . LUNG SURGERY     removed tissue       Home Medications    Prior to Admission medications   Medication Sig Start Date End Date Taking? Authorizing Provider  albuterol (PROVENTIL HFA) 108 (90 Base) MCG/ACT inhaler Inhale 2 puffs into the lungs every 4 (four) hours as needed for wheezing or shortness of breath. 11/20/18   [name], MD    albuterol (PROVENTIL) (2.5 MG/3ML) 0.083% nebulizer solution PLEASE SEE ATTACHED FOR DETAILED DIRECTIONS 02/26/18   [name], MD  albuterol (PROVENTIL) (2.5 MG/3ML) 0.083% nebulizer solution Take 3 mLs (2.5 mg total) by nebulization every 6 (six) hours as needed for wheezing or shortness of breath. 03/06/19   Bast, Traci A, NP  amLODipine (NORVASC) 10 MG tablet Take 10 mg by mouth daily.    [provider]  azithromycin (ZITHROMAX) 250 MG tablet Take 2 tablets (500 mg total) by mouth daily for 1 day, THEN 1 tablet (250 mg total) daily for 4 days. 03/10/19 03/15/19  Georgetta Haber, NP  benzonatate (TESSALON) 100 MG capsule Take 1 capsule (100 mg total) by mouth every 8 (eight) hours. 03/06/19   Bast, Gloris Manchester A, NP  Budeson-Glycopyrrol-Formoterol (BREZTRI AEROSPHERE) 160-9-4.8 MCG/ACT AERO Inhale 2 puffs into the lungs 2 (two) times daily. 11/20/18   [name], MD  DULERA 200-5 MCG/ACT AERO TAKE 2 PUFFS BY MOUTH TWICE A DAY 01/07/19   [name], MD  gabapentin (NEURONTIN) 300 MG capsule Take 1 capsule (300 mg total) by mouth 3 (three) times daily. 02/09/18   Eustace Moore, MD  nitroGLYCERIN (NITROSTAT) 0.4 MG SL tablet Place 0.4 mg under the tongue every 5 (five) minutes as needed for chest pain.    [provider]  Oxycodone HCl 20  MG TABS Take 1 tablet by mouth every 4 (four) hours as needed. 07/25/18   [provider]  predniSONE (STERAPRED UNI-PAK 21 TAB) 10 MG (21) TBPK tablet Take by mouth daily. Per box instruction 03/10/19   Augusto Gamble B, NP  sildenafil (VIAGRA) 100 MG tablet Take 100 mg by mouth daily as needed for erectile dysfunction.    [provider]    Family History Family History  Problem Relation Age of Onset  . Hypertension Mother   . Diabetes Maternal Grandmother   . Colon cancer Neg Hx   . Esophageal cancer Neg Hx   . Rectal cancer Neg Hx   . Inflammatory bowel disease Neg Hx   . Liver disease Neg Hx   . Pancreatic  cancer Neg Hx     Social History Social History   Tobacco Use  . Smoking status: Current Every Day Smoker    Packs/day: 0.50    Years: 34.00    Pack years: 17.00    Types: Cigarettes  . Smokeless tobacco: Never Used  Substance Use Topics  . Alcohol use: Never  . Drug use: Never     Allergies   Ibuprofen, Shrimp [shellfish allergy], Tylenol [acetaminophen], and Lisinopril   Review of Systems Review of Systems   Physical Exam Triage Vital Signs ED Triage Vitals  Enc Vitals Group     BP 03/10/19 1231 (!) 171/113     Pulse Rate 03/10/19 1231 85     Resp 03/10/19 1231 19     Temp 03/10/19 1231 98.2 F (36.8 C)     Temp Source 03/10/19 1231 Oral     SpO2 03/10/19 1231 95 %     Weight --      Height --      Head Circumference --      Peak Flow --      Pain Score 03/10/19 1230 6     Pain Loc --      Pain Edu? --      Excl. in [place]? --    No data found.  Updated Vital Signs BP (!) 171/113 (BP Location: Left Arm)   Pulse 85   Temp 98.2 F (36.8 C) (Oral)   Resp 19   SpO2 95%    Physical Exam Constitutional:      Appearance: He is well-developed.  Cardiovascular:     Rate and Rhythm: Normal rate.  Pulmonary:     Effort: Pulmonary effort is normal.     Breath sounds: Decreased breath sounds present. No wheezing.     Comments: No increased work of breathing noted; no cough during history noted  Chest:     Chest wall: Tenderness present. No mass, deformity or crepitus.       Comments: Tenderness on palpation to right anterior chest wall Skin:    General: Skin is warm and dry.  Neurological:     Mental Status: He is alert and oriented to person, place, and time.      UC Treatments / Results  Labs (all labs ordered are listed, but only abnormal results are displayed) Labs Reviewed - No data to display  EKG   Radiology No results found.  Procedures Procedures (including critical care time)  Medications Ordered in UC Medications - No data to  display  Initial Impression / Assessment and Plan / UC Course  I have reviewed the triage vital signs and the nursing notes.  Pertinent labs & imaging results that were available during my care  of the patient were reviewed by me and considered in my medical decision making (see chart for details).     No increased work of breathing. O2 at baseline, heart rate has improved since last visit. Cough has improved some although now with musculoskeletal pain from coughing. Afebrile. Lungs are grossly clear. COPD and follows with pulmonology. Will provide additional prednisone at this point and add azithromycin due to persistence of cough, treating as COPD exacerbation. Negative covid testing. No other shortness of breath  Or chest pain , no leg pain or swelling, no further concerns for PE. Encouraged recheck with pulmonology. Return precautions provided. Patient verbalized understanding and agreeable to plan.  Ambulatory out of clinic without difficulty.    Final Clinical Impressions(s) / UC Diagnoses   Final diagnoses:  Cough  COPD exacerbation (HCC)  Chest wall pain     Discharge Instructions     Brace with a  pillow, use of ice or heat to your chest to help with pain.  I have sent an additional steroid taper to start once you complete previously provided.  Complete course of antibiotics as prescribed today.  Your chest xray was normal on 1/27 which is reassuring today.  Negative for covid-19 as well.  Please follow up with your pulmonologist for recheck if symptoms persist.  If worsening of pain, shortness of breath , difficulty breathing, calf pain, please go to the ER.     ED Prescriptions    Medication Sig Dispense Auth. Provider   predniSONE (STERAPRED UNI-PAK 21 TAB) 10 MG (21) TBPK tablet Take by mouth daily. Per box instruction 21 tablet Keslee Harrington B, NP   azithromycin (ZITHROMAX) 250 MG tablet Take 2 tablets (500 mg total) by mouth daily for 1 day, THEN 1 tablet (250 mg  total) daily for 4 days. 6 tablet Georgetta Haber, NP     PDMP not reviewed this encounter.   Georgetta Haber, NP 03/10/19 1540

## 2019-03-10 NOTE — ED Triage Notes (Signed)
Patient presents to Urgent Care with complaints of cough since the past week. Patient reports he coughs so hard now he has right rib pain, would like a x-ray.

## 2019-03-18 ENCOUNTER — Telehealth: Payer: Self-pay | Admitting: Internal Medicine

## 2019-03-18 NOTE — Telephone Encounter (Signed)
Tried calling the pt, received a fast busy signal x2. Checked the number to make sure I was calling the right number x2. Will try back.

## 2019-03-19 NOTE — Telephone Encounter (Signed)
Spoke with pt. He is wanting to know if insurance would cover a room humidifier. Advised him that I don't think that they would. Advised him that he could get a decently priced one at [facility] or [facility]. He verbalized understanding. Nothing further was needed.

## 2019-03-24 NOTE — Progress Notes (Deleted)
@Patient  ID: Glenn [name], male    DOB: 10-04-1960, 59 y.o.   MRN: [id number]  No chief complaint on file.   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 Lung vol reduction ? Helped breathing at the time but uses [facility] parking and  [facility] = can't walk a nl pace on a flat grade s sob but does fine slow and flat so referred to pulmonary clinic 10/05/2016 by Dr  Jackson Latino     03/24/2019  - Visit     Questionaires / Pulmonary Flowsheets:   ACT:  No flowsheet data found.  MMRC: No flowsheet data found.  Epworth:  No flowsheet data found.  Tests:   FENO:  No results found for: NITRICOXIDE  PFT: PFT Results Latest Ref Rng & Units 02/08/2018  FVC-Pre L 1.53  FVC-Predicted Pre % 48  Pre FEV1/FVC % % 59  FEV1-Pre L 0.90  FEV1-Predicted Pre % 36  DLCO UNC% % 52  DLCO COR %Predicted % 106  TLC L 3.90  TLC % Predicted % 67  RV % Predicted % 115    WALK:  No flowsheet data found.  Imaging: DG Chest 2 View  Result Date: 03/06/2019 CLINICAL DATA:  Cough wheezing sarcoid EXAM: CHEST - 2 VIEW COMPARISON:  12/07/2018 FINDINGS: No acute opacity, pleural effusion or pneumothorax. Right infrahilar scarring as before. Bilateral perihilar postsurgical changes and scarring. Stable cardiomediastinal silhouette. No pneumothorax. IMPRESSION: No active cardiopulmonary disease. Stable scarring and postsurgical changes bilaterally. Electronically Signed   By: Donavan Foil M.D.   On: 03/06/2019 15:49    Lab Results:  CBC    Component Value Date/Time   WBC 5.9 11/22/2017 1639   RBC 4.91 11/22/2017 1639   HGB 14.3 11/22/2017 1639   HCT 43.5 11/22/2017 1639   PLT 233.0 11/22/2017 1639   MCV 88.7 11/22/2017 1639     MCHC 32.9 11/22/2017 1639   RDW 13.9 11/22/2017 1639   LYMPHSABS 2.7 11/22/2017 1639   MONOABS 0.6 11/22/2017 1639   EOSABS 0.6 11/22/2017 1639   BASOSABS 0.0 11/22/2017 1639    BMET No results found for: NA, K, CL, CO2, GLUCOSE, BUN, CREATININE, CALCIUM, GFRNONAA, GFRAA  BNP No results found for: BNP  ProBNP No results found for: PROBNP  Specialty Problems      Pulmonary Problems   COPD GOLD III/ actively smoking     Active smoker   - Spirometry 10/05/2016  FEV1 1.36 (52%)  Ratio 65 p saba with classic curvature - 10/05/2016  > try dulera 200 2bid   - Allergy profile 11/22/2017 >  Eos 0.6 /  IgE  25 RAST neg x for roaches/ neg for mold  - 11/22/2017   continue dulera 200 2bid  - PFT's  02/08/2018  FEV1 0.90 (36 % ) ratio 59   p multiple saba prior to study with DLCO  52 % corrects to 106  % for alv volume   02/08/2018  After extensive coaching inhaler device,  effectiveness =    90% from a baseline of 75% with smi so try spiriva respimat - 08/14/2018    changed to trelegy but preferred symb/spriva - 11/20/2018  After extensive coaching  inhaler device,  effectiveness =   75% try  Breztri      Elevated diaphragm    Noted 10/05/2016 s baseline but reports bilateral lung surgery in [place] 2011 > recs requested          Allergies  Allergen Reactions  . Ibuprofen     Abdominal sensitivity  . Shrimp [Shellfish Allergy] Itching  . Tylenol [Acetaminophen]     Abdominal sensitvity  . Lisinopril Rash    Tongue swelling     There is no immunization history on file for this patient.  Past Medical History:  Diagnosis Date  . Arthritis   . Asthma   . COPD (chronic obstructive pulmonary disease) (HCC)   . ED (erectile dysfunction)   . Hypertension     Tobacco History: Social History   Tobacco Use  Smoking Status Current Every Day Smoker  . Packs/day: 0.50  . Years: 34.00  . Pack years: 17.00  . Types: Cigarettes  Smokeless Tobacco Never Used   Ready to quit: Not  Answered Counseling given: Not Answered   Continue to not smoke  Outpatient Encounter Medications as of 03/25/2019  Medication Sig  . albuterol (PROVENTIL HFA) 108 (90 Base) MCG/ACT inhaler Inhale 2 puffs into the lungs every 4 (four) hours as needed for wheezing or shortness of breath.  Marland Kitchen albuterol (PROVENTIL) (2.5 MG/3ML) 0.083% nebulizer solution PLEASE SEE ATTACHED FOR DETAILED DIRECTIONS  . albuterol (PROVENTIL) (2.5 MG/3ML) 0.083% nebulizer solution Take 3 mLs (2.5 mg total) by nebulization every 6 (six) hours as needed for wheezing or shortness of breath.  Marland Kitchen amLODipine (NORVASC) 10 MG tablet Take 10 mg by mouth daily.  . benzonatate (TESSALON) 100 MG capsule Take 1 capsule (100 mg total) by mouth every 8 (eight) hours.  . Budeson-Glycopyrrol-Formoterol (BREZTRI AEROSPHERE) 160-9-4.8 MCG/ACT AERO Inhale 2 puffs into the lungs 2 (two) times daily.  . DULERA 200-5 MCG/ACT AERO TAKE 2 PUFFS BY MOUTH TWICE A DAY  . gabapentin (NEURONTIN) 300 MG capsule Take 1 capsule (300 mg total) by mouth 3 (three) times daily.  . nitroGLYCERIN (NITROSTAT) 0.4 MG SL tablet Place 0.4 mg under the tongue every 5 (five) minutes as needed for chest pain.  . Oxycodone HCl 20 MG TABS Take 1 tablet by mouth every 4 (four) hours as needed.  . predniSONE (STERAPRED UNI-PAK 21 TAB) 10 MG (21) TBPK tablet Take by mouth daily. Per box instruction  . sildenafil (VIAGRA) 100 MG tablet Take 100 mg by mouth daily as needed for erectile dysfunction.   No facility-administered encounter medications on file as of 03/25/2019.     Review of Systems  Review of Systems   Physical Exam  There were no vitals taken for this visit.  Wt Readings from Last 5 Encounters:  11/20/18 144 lb 12.8 oz (65.7 kg)  08/14/18 144 lb (65.3 kg)  02/08/18 143 lb 4.8 oz (65 kg)  12/12/17 138 lb (62.6 kg)  11/22/17 142 lb (64.4 kg)    BMI Readings from Last 5 Encounters:  11/20/18 24.10 kg/m  08/14/18 23.96 kg/m  02/08/18 24.60  kg/m  12/12/17 22.96 kg/m  11/22/17 23.63 kg/m     Physical Exam    Assessment & Plan:   No problem-specific Assessment & Plan notes found for this encounter.    No follow-ups on file.   Coral Ceo, NP 03/24/2019   This appointment required *** minutes of patient care (this includes precharting, chart review, review of results, face-to-face care, etc.).

## 2019-03-25 ENCOUNTER — Ambulatory Visit: Payer: Medicaid Other | Admitting: Pulmonary Disease

## 2019-03-25 ENCOUNTER — Ambulatory Visit: Payer: Medicaid Other | Admitting: Internal Medicine

## 2019-03-26 ENCOUNTER — Encounter: Payer: Self-pay | Admitting: Internal Medicine

## 2019-03-26 ENCOUNTER — Other Ambulatory Visit: Payer: Self-pay

## 2019-03-26 ENCOUNTER — Ambulatory Visit: Payer: Medicaid Other | Admitting: Internal Medicine

## 2019-03-26 DIAGNOSIS — F17210 Nicotine dependence, cigarettes, uncomplicated: Secondary | ICD-10-CM

## 2019-03-26 DIAGNOSIS — J449 Chronic obstructive pulmonary disease, unspecified: Secondary | ICD-10-CM

## 2019-03-26 MED ORDER — BREZTRI AEROSPHERE 160-9-4.8 MCG/ACT IN AERO: 2.0000 | INHALATION_SPRAY | Freq: Two times a day (BID) | RESPIRATORY_TRACT | 11 refills | Status: DC

## 2019-03-26 MED ORDER — BREZTRI AEROSPHERE 160-9-4.8 MCG/ACT IN AERO: 2.0000 | INHALATION_SPRAY | Freq: Two times a day (BID) | RESPIRATORY_TRACT | 0 refills | Status: DC

## 2019-03-26 MED ORDER — PREDNISONE 10 MG PO TABS: ORAL_TABLET | ORAL | 0 refills | Status: DC

## 2019-03-26 NOTE — Patient Instructions (Addendum)
Plan A = Automatic = Always=    Breztri Take 2 puffs first thing in am and then another 2 puffs about 12 hours later.   Work on inhaler technique:  relax and gently blow all the way out then take a nice smooth deep breath back in, triggering the inhaler at same time you start breathing in.  Hold for up to 5 seconds if you can. Blow out thru nose. Rinse and gargle with water when done (remember the golfer taking practice swings)    Plan B = Backup (to supplement plan A, not to replace it) Only use your albuterol inhaler as a rescue medication to be used if you can't catch your breath by resting or doing a relaxed purse lip breathing pattern.  - The less you use it, the better it will work when you need it. - Ok to use the inhaler up to 2 puffs  every 4 hours if you must but call for appointment if use goes up over your usual need - Don't leave home without it !!  (think of it like the spare tire for your car)   Plan C = Crisis (instead of Plan B but only if Plan B stops working) - only use your albuterol nebulizer if you first try Plan B and it fails to help > ok to use the nebulizer up to every 4 hours but if start needing it regularly call for immediate appointment  Prednisone 10 mg take  4 each am x 2 days,   2 each am x 2 days,  1 each am x 2 days and stop   The key is to stop smoking completely before smoking completely stops you!  Try albuterol 15 min before an activity that you know would make you short of breath and see if it makes any difference and if makes none then don't take it after activity unless you can't catch your breath.       Please schedule a follow up office visit in 6 weeks, call sooner if needed with all medications /inhalers/ solutions in hand so we can verify exactly what you are taking. This includes all medications from all doctors and over the counters

## 2019-03-26 NOTE — Progress Notes (Signed)
Subjective:     Patient ID: Glenn [name], male   DOB: Jan 25, 1961     MRN: [id number]      Brief patient profile:  60 yobm active smoker  with onset of symptoms in 1995 while in [place] dx as asthma vs sarcoid and rx pred/ theoph / albuterol seen by allergist dx as pollen moved to [place] where underwent bilateral lung surgery 2011 ? Lung vol reduction ? Helped breathing at the time but uses [facility] parking and  [facility] = can't walk a nl pace on a flat grade s sob but does fine slow and flat so referred to pulmonary clinic 10/05/2016 by Dr  Tyson Dense      History of Present Illness  10/05/2016 1st [facility] office visit/ Lizann Edelman   Chief Complaint  Patient presents with  . Pulmonary Consult    Referred by Dr. Alveta Heimlich for eval of Asthma and COPD. Pt states he was dxed with COPD and Asthma in 1995. He moved here a year ago from [place], [place]. He states his breathing is "okay" today. He states he has a "slight cold"- prod cough with minimal brown sputum.    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  Work on [employer]maker B = Backup Only use your albuterol (proair) as a rescue medication Plan C = Crisis - only use your albuterol nebulizer if you first try Plan B and it fails to help > ok to use the nebulizer up to every 4 hours but if start needing it regularly call for immediate appointment Please schedule a follow up office visit in 6 weeks, call sooner if needed with pfts     11/22/2017  f/u ov/Resa Rinks re:  GOLD II/ still smoking   Chief Complaint  Patient presents with  . Follow-up    Breathing is overall doing okay. He is wheezing and has occ non prod cough. He is using his albuterol inhaler and neb both 3-4 x daily.   Dyspnea:  MMRC2 = can't walk a nl pace on a flat grade s sob but does fine slow and flat  Cough: mostly dry, worse in house where he thinks there is mold Sleeping: some wheezing noct/ freq saba noct rec Prednisone 10 mg take  4 each am x 2 days,   2 each am x 2 days,  1 each am x 2 days and stop  Plan A = Automatic = dulera 200 Take 2 puffs first thing in am and then another 2 puffs about 12 hours later.  Work on inhaler technique: Plan B = Backup Only use your albuterol (proair) as a rescue medication  Plan C = Crisis - only use your albuterol nebulizer if you first try Plan B  The key is to stop smoking completely before smoking completely stops you!      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 Follow-up    Pt had PFT prior to ov today.  Dyspnea:  MMRC2 = can't walk a nl pace on a flat grade s sob but does fine slow and flat / worse with cold x one week Cough: more with cold, not producing any mucus  Sleeping: 2 pillows, bed flat SABA use: when not having a cold  Maybe once a day 02: none  rec Prednisone 10 mg take  4 each am x 2 days,   2 each am x 2 days,  1 each am x 2 days and stop  Plan A = Automatic = dulera 200 Take 2 puffs first thing in am and then another 2 puffs about 12 hours later  And spiriva 2 puffs each am  Work on inhaler technique: Plan B = Backup Only use your albuterol (proair= Ventolin)  as a rescue medication Plan C = Crisis - only use your albuterol nebulizer if you first try Plan B and it fails to help > ok to use the nebulizer up to every 4 hours but if start needing it regularly call for immediate appointment The key is to stop smoking completely before smoking  completely stops you!    08/14/2018  f/u ov/Alyaan Budzynski re: GOLD II/ active smoking/ on dulera 200/ spiriva  Chief Complaint  Patient presents with  . Follow-up    Breathing is some worse with hot/humid weather. He states he does not have a rescue inhaler. He uses his neb 3 x daily on average.   Dyspnea:  MMRC2 = can't walk a nl pace on a flat grade s sob but does fine slow and flat  Cough: min mucoid esp in am Sleeping: bed is flat, one pillow / rarely wakes up needs neb  SABA use: way too much neb saba ,no hfa on hand  02: none  rec Plan A = Automatic = trelegy one click each am, take two good drags  Plan B = Backup Only use your albuterol inhaler as a rescue medication t Plan C = Crisis - only use your albuterol nebulizer if you first try Plan B and it fails to help > ok to use the nebulizer up to every 4 hours but if start needing it regularly call for immediate appointment Prednisone 10 mg take  4 each am x 2 days,   2 each am x 2 days,  1 each am x 2 days and stop  Please schedule a follow up visit in 3 months but call sooner if needed  with all medications /inhalers/ solutions in hand so we can verify exactly what you are taking. This includes all medications from all doctors and over the counters Add: cxr on return     11/20/2018  f/u ov/Lenoria Narine re:  Copd/ still smoking and meds empty Chief Complaint  Patient presents with  . Follow-up    Breathing is "okay" but he is using his nebulizer with albuterol 3-4 x per day. He came in today with a empty Dulera inhaler and states this is what he has been using.   Dyspnea:  MMRC2 = can't walk a nl pace on a flat grade s sob but does fine slow and flat Cough: no  Sleeping: ok flat  SABA use: as above -was much less until ran out of maint rx  02: none  rec  Plan  A = Automatic = Breztri Take 2 puffs first thing in am and then another 2 puffs about 12 hours later.  Work on inhaler technique If not happy, resume dulera and spiriva like before   Plan B = Backup Only use your albuterol inhaler as a rescue medication Plan C = Crisis - only use your albuterol nebulizer if you first try Plan B and it fails to help > ok to use the nebulizer up to every 4 hours but if start needing it regularly call for immediate appointment Prednisone 10 mg take  4 each am x 2 days,   2 each am x 2 days,  1 each am x 2 days and stop  The key is to stop smoking completely before smoking completely stops you! Please schedule a follow up office visit in 4 weeks, sooner if needed  with all medications /inhalers/ solutions in hand so we can verify exactly what you are taking. This includes all medications from all doctors and over the counters   03/26/2019  f/u ov/Morningstar Toft re:  COPD II/still still smoker / did not bring meds but only on dulera 200 now "when he can get it"  Chief Complaint  Patient presents with  . Follow-up    Pt c/o non prod cough and wheezing x 2 wks. He is using his albuterol inhaler and neb both about 4 x per day.   Dyspnea: highly variable / not consistent with meds Cough: better/ sporadic and dry day > noct   Sleeping: at least once a night wakes up sob / wheezing / uses saba SABA use: way too much  02: none  Prednisone does help all his symptoms including the cough but not really clear it reduces his saba dependency    No obvious day to day or daytime variability or assoc excess/ purulent sputum or mucus plugs or hemoptysis or cp or chest tightness,  overt sinus or hb symptoms.    Also denies any obvious fluctuation of symptoms with weather or environmental changes or other aggravating or alleviating factors except as outlined above   No unusual exposure hx or h/o childhood pna/ asthma or knowledge of premature birth.  Current Allergies, Complete Past Medical History, Past Surgical History, Family History, and Social History were reviewed in [employer] record.  ROS  The following are not active complaints  unless bolded Hoarseness, sore throat, dysphagia, dental problems, itching, sneezing,  nasal congestion or discharge of excess mucus or purulent secretions, ear ache,   fever, chills, sweats, unintended wt loss or wt gain, classically pleuritic or exertional cp,  orthopnea pnd or arm/hand swelling  or leg swelling, presyncope, palpitations, abdominal pain, anorexia, nausea, vomiting, diarrhea  or change in bowel habits or change in bladder habits, change in stools or change in urine, dysuria, hematuria,  rash, arthralgias, visual complaints, headache, numbness, weakness or ataxia or problems with walking or coordination,  change in mood or  memory.        Current Meds  Medication Sig  . albuterol (PROVENTIL HFA) 108 (90 Base) MCG/ACT inhaler Inhale 2 puffs into the lungs every 4 (four) hours as needed for wheezing or shortness of breath.  Marland Kitchen albuterol (PROVENTIL) (2.5 MG/3ML) 0.083% nebulizer solution PLEASE SEE ATTACHED FOR DETAILED DIRECTIONS  . amLODipine (NORVASC) 10 MG tablet Take 10 mg by mouth daily.  . benzonatate (TESSALON) 100 MG capsule Take 1 capsule (100 mg total) by mouth every 8 (eight) hours.  . DULERA 200-5 MCG/ACT AERO TAKE  2 PUFFS BY MOUTH TWICE A DAY  . gabapentin (NEURONTIN) 300 MG capsule Take 1 capsule (300 mg total) by mouth 3 (three) times daily.  . nitroGLYCERIN (NITROSTAT) 0.4 MG SL tablet Place 0.4 mg under the tongue every 5 (five) minutes as needed for chest pain.  . Oxycodone HCl 20 MG TABS Take 1 tablet by mouth every 4 (four) hours as needed.  . predniSONE (STERAPRED UNI-PAK 21 TAB) 10 MG (21) TBPK tablet Take by mouth daily. Per box instruction  . sildenafil (VIAGRA) 100 MG tablet Take 100 mg by mouth daily as needed for erectile dysfunction.                  Objective:   Physical Exam      03/26/2019        144  11/20/2018      144  08/14/2018          144  02/08/2018          143  11/22/17 142 lb (64.4 kg)  10/05/16 131 lb 12.8 oz (59.8 kg)    Amb  anxious bm nad  Vital signs reviewed  03/26/2019  - Note at rest 02 sats  96% on RA         HEENT : pt wearing mask not removed for exam due to covid -19 concerns.    NECK :  without JVD/Nodes/TM/ nl carotid upstrokes bilaterally   LUNGS: no acc muscle use,  Mod barrel  contour chest wall with bilateral  Distant bs s audible wheeze and  without cough on insp or exp maneuvers and mod  Hyperresonant  to  percussion bilaterally     CV:  RRR  no s3 or murmur or increase in P2, and no edema   ABD:  soft and nontender with pos mid insp Hoover's  in the supine position. No bruits or organomegaly appreciated, bowel sounds nl  MS:     ext warm without deformities, calf tenderness, cyanosis or clubbing No obvious joint restrictions   SKIN: warm and dry without lesions    NEURO:  alert, approp, nl sensorium with  no motor or cerebellar deficits apparent.        I personally reviewed images and agree with radiology impression as follows:  CXR:   03/06/19 No active cardiopulmonary disease. Stable scarring and postsurgical changes bilaterally.        Assessment:

## 2019-03-27 ENCOUNTER — Encounter: Payer: Self-pay | Admitting: Internal Medicine

## 2019-03-27 NOTE — Assessment & Plan Note (Addendum)
Counseled re importance of smoking cessation but did not meet time criteria for separate billing    Advised Pt informed of the seriousness of COVID 19 infection as a direct risk to lung health  and safey and to close contacts and should continue to wear a facemask in public and minimize exposure to public locations but especially avoid any area or activity where non-close contacts are not observing distancing or wearing an appropriate face mask.  I strongly recommended vaccine when offered.           Each maintenance medication was reviewed in detail including emphasizing most importantly the difference between maintenance and prns and under what circumstances the prns are to be triggered using an action plan format where appropriate.  Total time for H and P, chart review, counseling, teaching device and generating customized AVS unique to this office visit / charting = 30 min

## 2019-03-27 NOTE — Assessment & Plan Note (Addendum)
Active smoker   - Spirometry 10/05/2016  FEV1 1.36 (52%)  Ratio 65 p saba with classic curvature - 10/05/2016  > try dulera 200 2bid   - Allergy profile 11/22/2017 >  Eos 0.6 /  IgE  25 RAST neg x for roaches/ neg for mold  - 11/22/2017   continue dulera 200 2bid  - PFT's  02/08/2018  FEV1 0.90 (36 % ) ratio 59   p multiple saba prior to study with DLCO  52 % corrects to 106  % for alv volume   02/08/2018  After extensive coaching inhaler device,  effectiveness =    90% from a baseline of 75% with smi so try spiriva respimat - 08/14/2018    changed to trelegy but preferred symb/spriva - 11/20/2018     Breztri > better but not covered by medicaid - 03/26/2019  After extensive coaching inhaler device,  effectiveness =    75% (short Ti) > rechallenge with Breztri   Group D in terms of symptom/risk and laba/lama/ICS  therefore appropriate rx at this point >>>  Breztri approp/ way overusing saba > Prednisone 10 mg take  4 each am x 2 days,   2 each am x 2 days,  1 each am x 2 days and stop   Advised:  formulary restrictions will be an ongoing challenge for the forseable future and I would be happy to pick an alternative if the pt will first  provide me a list of them -  pt  will need to return here for training for any new device that is required eg dpi vs hfa vs respimat.    In the meantime we can always provide samples so that the patient never runs out of any needed respiratory medications.   Also advised :  I spent extra time with pt today reviewing appropriate use of albuterol for prn use on exertion with the following points: 1) saba is for relief of sob that does not improve by walking a slower pace or resting but rather if the pt does not improve after trying this first. 2) If the pt is convinced, as many are, that saba helps recover from activity faster then it's easy to tell if this is the case by re-challenging : ie stop, take the inhaler, then p 5 minutes try the exact same activity (intensity of  workload) that just caused the symptoms and see if they are substantially diminished or not after saba 3) if there is an activity that reproducibly causes the symptoms, try the saba 15 min before the activity on alternate days   If in fact the saba really does help, then fine to continue to use it prn but advised may need to look closer at the maintenance regimen being used to achieve better control of airways disease with exertion.

## 2019-05-03 ENCOUNTER — Other Ambulatory Visit (HOSPITAL_COMMUNITY): Payer: Self-pay | Admitting: Internal Medicine

## 2019-05-08 ENCOUNTER — Telehealth: Payer: Self-pay | Admitting: Internal Medicine

## 2019-05-08 ENCOUNTER — Other Ambulatory Visit (HOSPITAL_COMMUNITY): Payer: Self-pay | Admitting: Internal Medicine

## 2019-05-08 NOTE — Telephone Encounter (Signed)
Called [facility] [employer] to initiate PA for [employer]. PA # [id number], was advised PA approved in 24 hours. Will need to call [facility] [employer] tomorrow.   Routing to [place] for follow up.

## 2019-05-13 ENCOUNTER — Ambulatory Visit: Payer: Medicaid Other | Admitting: Internal Medicine

## 2019-05-13 MED ORDER — BUDESONIDE-FORMOTEROL FUMARATE 160-4.5 MCG/ACT IN AERO: 2.0000 | INHALATION_SPRAY | Freq: Two times a day (BID) | RESPIRATORY_TRACT | 11 refills | Status: DC

## 2019-05-13 NOTE — Telephone Encounter (Signed)
Spoke with the pt and notified of recs per MW  He verbalized understanding  Rx for symbicort sent

## 2019-05-13 NOTE — Telephone Encounter (Signed)
PA has been denied.  Patient must try and fail two of the following meds:  Dulera, Advair, Symbicort.  Per chart pt has tried and failed Dulera in the past.  Must try and fail either Advair or Symbicort before Markus Daft will be covered.  MW please advise on how you'd like to proceed.  Thanks!

## 2019-05-13 NOTE — Telephone Encounter (Signed)
Ok try symbicort 160 2bid in place of dulera and breztri (make sure he understands to stop both if still taking either)

## 2019-05-20 ENCOUNTER — Ambulatory Visit: Payer: Medicaid Other | Attending: Internal Medicine

## 2019-05-20 DIAGNOSIS — Z20822 Contact with and (suspected) exposure to covid-19: Secondary | ICD-10-CM

## 2019-05-22 ENCOUNTER — Ambulatory Visit: Payer: Medicaid Other | Admitting: Internal Medicine

## 2019-05-22 LAB — SARS-COV-2, NAA 2 DAY TAT

## 2019-05-22 LAB — NOVEL CORONAVIRUS, NAA: SARS-CoV-2, NAA: NOT DETECTED

## 2019-05-23 ENCOUNTER — Ambulatory Visit: Payer: Medicaid Other

## 2019-06-29 ENCOUNTER — Emergency Department (HOSPITAL_COMMUNITY): Payer: Medicaid Other

## 2019-06-29 ENCOUNTER — Other Ambulatory Visit: Payer: Self-pay

## 2019-06-29 ENCOUNTER — Inpatient Hospital Stay (HOSPITAL_COMMUNITY)
Admission: EM | Admit: 2019-06-29 | Discharge: 2019-07-01 | DRG: 190 | Disposition: A | Discharge status: Home / Self Care | Payer: Medicaid Other | Attending: Internal Medicine | Admitting: Internal Medicine

## 2019-06-29 DIAGNOSIS — Z8249 Family history of ischemic heart disease and other diseases of the circulatory system: Secondary | ICD-10-CM

## 2019-06-29 DIAGNOSIS — Z888 Allergy status to other drugs, medicaments and biological substances status: Secondary | ICD-10-CM

## 2019-06-29 DIAGNOSIS — R0602 Shortness of breath: Secondary | ICD-10-CM

## 2019-06-29 DIAGNOSIS — Z20822 Contact with and (suspected) exposure to covid-19: Secondary | ICD-10-CM | POA: Diagnosis present

## 2019-06-29 DIAGNOSIS — R55 Syncope and collapse: Secondary | ICD-10-CM | POA: Diagnosis present

## 2019-06-29 DIAGNOSIS — J441 Chronic obstructive pulmonary disease with (acute) exacerbation: Principal | ICD-10-CM | POA: Diagnosis present

## 2019-06-29 DIAGNOSIS — R222 Localized swelling, mass and lump, trunk: Secondary | ICD-10-CM

## 2019-06-29 DIAGNOSIS — Z91013 Allergy to seafood: Secondary | ICD-10-CM

## 2019-06-29 DIAGNOSIS — I10 Essential (primary) hypertension: Secondary | ICD-10-CM | POA: Diagnosis present

## 2019-06-29 DIAGNOSIS — M1990 Unspecified osteoarthritis, unspecified site: Secondary | ICD-10-CM | POA: Diagnosis present

## 2019-06-29 DIAGNOSIS — J9601 Acute respiratory failure with hypoxia: Secondary | ICD-10-CM | POA: Diagnosis present

## 2019-06-29 DIAGNOSIS — F17210 Nicotine dependence, cigarettes, uncomplicated: Secondary | ICD-10-CM | POA: Diagnosis present

## 2019-06-29 DIAGNOSIS — I959 Hypotension, unspecified: Secondary | ICD-10-CM | POA: Diagnosis present

## 2019-06-29 DIAGNOSIS — H209 Unspecified iridocyclitis: Secondary | ICD-10-CM | POA: Diagnosis present

## 2019-06-29 DIAGNOSIS — Z79899 Other long term (current) drug therapy: Secondary | ICD-10-CM

## 2019-06-29 DIAGNOSIS — N529 Male erectile dysfunction, unspecified: Secondary | ICD-10-CM | POA: Diagnosis present

## 2019-06-29 DIAGNOSIS — J440 Chronic obstructive pulmonary disease with acute lower respiratory infection: Secondary | ICD-10-CM | POA: Diagnosis present

## 2019-06-29 DIAGNOSIS — Z7951 Long term (current) use of inhaled steroids: Secondary | ICD-10-CM

## 2019-06-29 DIAGNOSIS — Z86010 Personal history of colonic polyps: Secondary | ICD-10-CM

## 2019-06-29 LAB — COMPREHENSIVE METABOLIC PANEL
ALT: 19 U/L (ref 0–44)
AST: 22 U/L (ref 15–41)
Albumin: 3 g/dL — ABNORMAL LOW (ref 3.5–5.0)
Alkaline Phosphatase: 49 U/L (ref 38–126)
Anion gap: 7 (ref 5–15)
BUN: 13 mg/dL (ref 6–20)
CO2: 28 mmol/L (ref 22–32)
Calcium: 8.4 mg/dL — ABNORMAL LOW (ref 8.9–10.3)
Chloride: 105 mmol/L (ref 98–111)
Creatinine, Ser: 1.05 mg/dL (ref 0.61–1.24)
GFR calc Af Amer: >60 mL/min (ref >60)
GFR calc non Af Amer: >60 mL/min (ref >60)
Glucose, Bld: 123 mg/dL — ABNORMAL HIGH (ref 70–99)
Potassium: 3.9 mmol/L (ref 3.5–5.1)
Sodium: 140 mmol/L (ref 135–145)
Total Bilirubin: 0.7 mg/dL (ref 0.3–1.2)
Total Protein: 5.4 g/dL — ABNORMAL LOW (ref 6.5–8.1)

## 2019-06-29 LAB — CBC
HCT: 47.4 % (ref 39.0–52.0)
Hemoglobin: 15.1 g/dL (ref 13.0–17.0)
MCH: 29.3 pg (ref 26.0–34.0)
MCHC: 31.9 g/dL (ref 30.0–36.0)
MCV: 91.9 fL (ref 80.0–100.0)
Platelets: 294 10*3/uL (ref 150–400)
RBC: 5.16 MIL/uL (ref 4.22–5.81)
RDW: 13.3 % (ref 11.5–15.5)
WBC: 9.8 10*3/uL (ref 4.0–10.5)
nRBC: 0 % (ref 0.0–0.2)

## 2019-06-29 LAB — TROPONIN I (HIGH SENSITIVITY): Troponin I (High Sensitivity): 6 ng/L (ref <18)

## 2019-06-29 LAB — LIPASE, BLOOD: Lipase: 18 U/L (ref 11–51)

## 2019-06-29 MED ORDER — SODIUM CHLORIDE 0.9 % IV BOLUS
500.0000 mL | Freq: Once | INTRAVENOUS | Status: AC
  Administered 2019-06-29: 500 mL via INTRAVENOUS

## 2019-06-29 MED ORDER — ALBUTEROL SULFATE (2.5 MG/3ML) 0.083% IN NEBU
5.0000 mg | INHALATION_SOLUTION | Freq: Once | RESPIRATORY_TRACT | Status: AC
  Administered 2019-06-29: 5 mg via RESPIRATORY_TRACT
  Filled 2019-06-29: qty 6

## 2019-06-29 MED ORDER — SODIUM CHLORIDE 0.9% FLUSH: 3.0000 mL | Freq: Once | INTRAVENOUS | Status: DC

## 2019-06-29 MED ORDER — IPRATROPIUM BROMIDE 0.02 % IN SOLN
0.5000 mg | Freq: Once | RESPIRATORY_TRACT | Status: AC
  Administered 2019-06-29: 0.5 mg via RESPIRATORY_TRACT
  Filled 2019-06-29: qty 2.5

## 2019-06-29 NOTE — ED Provider Notes (Signed)
[facility]   CSN: [id number] Arrival date & time: 06/29/19  2234     History Chief Complaint  Patient presents with  . Shortness of Breath  . Abdominal Pain    Glenn [name] is a 59 y.o. male with a hx of iritis, asthma, COPD, erectile dysfunction, hypertension presents to the Emergency Department complaining of acute, persistent, progressively worsening shortness of breath and lightheadedness onset 30 minutes prior to arrival.. Associated symptoms include near syncope.  Patient reports he was standing and praying when the symptoms started.  He reports severe abdominal pain in the epigastric region but denies specific chest pain.  Does report that he was seen by her cardiologist in [place] several years ago for chest pains and given nitroglycerin but denies specific cardiac history.  No aggravating or alleviating factors at this time.  Patient denies recent illness or sick contacts.  He has not been vaccinated for Covid.  Denies fever, chills, headache, neck pain, chest pain, nausea, vomiting, diarrhea.  Patient reports he is an active smoker.  He reports he smokes approximately 1 pack/week.  Denies drinking or drug use.    The history is provided by the patient and medical records. No language interpreter was used.       Past Medical History:  Diagnosis Date  . Arthritis   . Asthma   . COPD (chronic obstructive pulmonary disease) (HCC)   . ED (erectile dysfunction)   . Hypertension     Patient Active Problem List   Diagnosis Date Noted  . COPD with acute exacerbation (HCC) 06/30/2019  . Acute respiratory failure with hypoxemia (HCC) 06/30/2019  . Hypotension 06/30/2019  . Near syncope 06/30/2019  . Pleural nodule 06/30/2019  . Colon cancer screening 12/18/2017  . History of colonic polyps 12/18/2017  . Cigarette smoker 11/24/2017  . 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 COPD GOLD III/ actively smoking  10/05/2016    Past  Surgical History:  Procedure Laterality Date  . LUNG SURGERY     removed tissue       Family History  Problem Relation Age of Onset  . Hypertension Mother   . Diabetes Maternal Grandmother   . Colon cancer Neg Hx   . Esophageal cancer Neg Hx   . Rectal cancer Neg Hx   . Inflammatory bowel disease Neg Hx   . Liver disease Neg Hx   . Pancreatic cancer Neg Hx     Social History   Tobacco Use  . Smoking status: Current Every Day Smoker    Packs/day: 0.50    Years: 34.00    Pack years: 17.00    Types: Cigarettes  . Smokeless tobacco: Never Used  Substance Use Topics  . Alcohol use: Never  . Drug use: Never    Home Medications Prior to Admission medications   Medication Sig Start Date End Date Taking? Authorizing Provider  albuterol (PROVENTIL HFA) 108 (90 Base) MCG/ACT inhaler Inhale 2 puffs into the lungs every 4 (four) hours as needed for wheezing or shortness of breath. 11/20/18   Nyoka CowdenWert, Michael B, MD  albuterol (PROVENTIL) (2.5 MG/3ML) 0.083% nebulizer solution PLEASE SEE ATTACHED FOR DETAILED DIRECTIONS 02/26/18   Nyoka CowdenWert, Michael B, MD  amLODipine (NORVASC) 10 MG tablet Take 10 mg by mouth daily.    [provider]  benzonatate (TESSALON) 100 MG capsule Take 1 capsule (100 mg total) by mouth every 8 (eight) hours. 03/06/19   Janace ArisBast, Traci A, NP  budesonide-formoterol (SYMBICORT) 160-4.5 MCG/ACT  inhaler Inhale 2 puffs into the lungs 2 (two) times daily. 05/13/19   [name], MD  gabapentin (NEURONTIN) 300 MG capsule Take 1 capsule (300 mg total) by mouth 3 (three) times daily. 02/09/18   Eustace Moore, MD  nitroGLYCERIN (NITROSTAT) 0.4 MG SL tablet Place 0.4 mg under the tongue every 5 (five) minutes as needed for chest pain.    [provider]  Oxycodone HCl 20 MG TABS Take 1 tablet by mouth every 4 (four) hours as needed. 07/25/18   [provider]  predniSONE (DELTASONE) 10 MG tablet Take  4 each am x 2 days,   2 each am x 2 days,  1 each am x  2 days and stop 03/26/19   [name], MD  sildenafil (VIAGRA) 100 MG tablet Take 100 mg by mouth daily as needed for erectile dysfunction.    [provider]    Allergies    Ibuprofen, Shrimp [shellfish allergy], Tylenol [acetaminophen], and Lisinopril  Review of Systems   Review of Systems  Constitutional: Positive for fatigue. Negative for appetite change, diaphoresis, fever and unexpected weight change.  HENT: Negative for mouth sores.   Eyes: Negative for visual disturbance.  Respiratory: Positive for chest tightness and shortness of breath. Negative for cough and wheezing.   Cardiovascular: Negative for chest pain.  Gastrointestinal: Positive for abdominal pain. Negative for constipation, diarrhea, nausea and vomiting.  Endocrine: Negative for polydipsia, polyphagia and polyuria.  Genitourinary: Negative for dysuria, frequency, hematuria and urgency.  Musculoskeletal: Negative for back pain and neck stiffness.  Skin: Negative for rash.  Allergic/Immunologic: Negative for immunocompromised state.  Neurological: Positive for syncope ( near syncope). Negative for light-headedness and headaches.  Hematological: Does not bruise/bleed easily.  Psychiatric/Behavioral: Negative for sleep disturbance. The patient is not nervous/anxious.     Physical Exam Updated Vital Signs BP 108/79   Pulse 96   Temp 98.7 F (37.1 C) (Oral)   Resp 17   Ht 5\' 5"  (1.651 m)   Wt 63 kg   SpO2 100%   BMI 23.13 kg/m   Physical Exam Vitals and nursing note reviewed.  Constitutional:      General: He is not in acute distress.    Appearance: He is not diaphoretic.  HENT:     Head: Normocephalic.  Eyes:     General: No scleral icterus.    Conjunctiva/sclera: Conjunctivae normal.  Cardiovascular:     Rate and Rhythm: Normal rate and regular rhythm.     Pulses: Normal pulses.          Radial pulses are 2+ on the right side and 2+ on the left side.  Pulmonary:     Effort: Tachypnea,  accessory muscle usage and respiratory distress present. No prolonged expiration or retractions.     Breath sounds: Decreased air movement present. No stridor. Wheezing present.     Comments: Equal chest rise. Diminished breath sounds throughout.  Increased work of breathing.  Faint expiratory and inspiratory wheezes in the upper lobes.  Almost no breath sounds in the lower lobes. Oxygen saturation 88% at rest with good pleth - oxygen 2L via [place] placed on patient. Abdominal:     General: There is no distension.     Palpations: Abdomen is soft.     Tenderness: There is abdominal tenderness in the epigastric area. There is no right CVA tenderness, left CVA tenderness, guarding or rebound.  Musculoskeletal:     Cervical back: Normal range of motion.  Comments: Moves all extremities equally and without difficulty.  Skin:    General: Skin is warm and dry.     Capillary Refill: Capillary refill takes less than 2 seconds.  Neurological:     Mental Status: He is alert.     GCS: GCS eye subscore is 4. GCS verbal subscore is 5. GCS motor subscore is 6.     Comments: Speech is clear and goal oriented.  Psychiatric:        Mood and Affect: Mood normal.     ED Results / Procedures / Treatments   Labs (all labs ordered are listed, but only abnormal results are displayed) Labs Reviewed  COMPREHENSIVE METABOLIC PANEL - Abnormal; Notable for the following components:      Result Value   Glucose, Bld 123 (*)    Calcium 8.4 (*)    Total Protein 5.4 (*)    Albumin 3.0 (*)    All other components within normal limits  SARS CORONAVIRUS 2 BY RT PCR (HOSPITAL ORDER, PERFORMED IN [facility])  CBC  LIPASE, BLOOD  BRAIN NATRIURETIC PEPTIDE  HIV ANTIBODY (ROUTINE TESTING W REFLEX)  PROCALCITONIN  TROPONIN I (HIGH SENSITIVITY)  TROPONIN I (HIGH SENSITIVITY)    EKG EKG Interpretation  Date/Time:  Saturday Jun 29 2019 22:39:04 EDT Ventricular Rate:  84 PR Interval:  122 QRS  Duration: 80 QT Interval:  380 QTC Calculation: 449 R Axis:   55 Text Interpretation: Normal sinus rhythm Nonspecific T wave abnormality Abnormal ECG No old tracing to compare Confirmed by Rolan Bucco [id number]) on 06/29/2019 10:52:47 PM   Radiology DG Chest Port 1 View  Result Date: 06/29/2019 CLINICAL DATA:  Shortness of breath EXAM: PORTABLE CHEST 1 VIEW COMPARISON:  March 06, 2019 FINDINGS: There is elevation of the left hemidiaphragm. Bibasilar airspace opacities are again noted and favored to represent areas of atelectasis and scarring. The patient is likely status post prior wedge resection in the left upper lobe. Surgical staples are noted along the patient's left mediastinum. There is no pneumothorax. No focal infiltrate. There are emphysematous changes bilaterally. The patient appears to be status post prior wedge resection in the right upper lung zone. IMPRESSION: No acute cardiopulmonary process. Chronic findings as detailed above. Electronically Signed   By: Katherine Mantle M.D.   On: 06/29/2019 23:17   CT Angio Chest/Abd/Pel for Dissection W and/or Wo Contrast  Result Date: 06/30/2019 CLINICAL DATA:  Chest pain EXAM: CT ANGIOGRAPHY CHEST, ABDOMEN AND PELVIS TECHNIQUE: Non-contrast CT of the chest was initially obtained. Multidetector CT imaging through the chest, abdomen and pelvis was performed using the standard protocol during bolus administration of intravenous contrast. Multiplanar reconstructed images and MIPs were obtained and reviewed to evaluate the vascular anatomy. CONTRAST:  OMNIPAQUE IOHEXOL 350 MG/ML SOLN COMPARISON:  None. FINDINGS: CTA CHEST FINDINGS Cardiovascular: There is no evidence for thoracic aortic dissection. Atherosclerotic changes are noted of the thoracic aorta without evidence for an aneurysm. The heart size is normal without evidence for a significant pericardial effusion. There is no pulmonary embolism. Mediastinum/Nodes: --No mediastinal or hilar  lymphadenopathy. --No axillary lymphadenopathy. --No supraclavicular lymphadenopathy. --Normal thyroid gland. --The esophagus is unremarkable Lungs/Pleura: There are postsurgical changes bilaterally related to prior wedge resection. There are areas of scarring atelectasis bilaterally. There is hyperdense pleural base nodularity involving the right hemithorax. There is some atelectasis versus consolidation involving the right upper lobe. Architectural distortion is noted bilaterally resulting in volume loss. There is bilateral bronchial wall thickening with minimal  mucous plugging. There is no pneumothorax or significant pleural effusion. Musculoskeletal: No chest wall abnormality. No acute or significant osseous findings. Review of the MIP images confirms the above findings. CTA ABDOMEN AND PELVIS FINDINGS VASCULAR Aorta: Normal caliber aorta without aneurysm, dissection, vasculitis or significant stenosis. Celiac: Patent without evidence of aneurysm, dissection, vasculitis or significant stenosis. SMA: There is a replaced common hepatic artery arising from the SMA. Renals: Both renal arteries are patent without evidence of aneurysm, dissection, vasculitis, fibromuscular dysplasia or significant stenosis. IMA: Patent without evidence of aneurysm, dissection, vasculitis or significant stenosis. Inflow: Patent without evidence of aneurysm, dissection, vasculitis or significant stenosis. Veins: No obvious venous abnormality within the limitations of this arterial phase study. Review of the MIP images confirms the above findings. NON-VASCULAR Hepatobiliary: There is an indeterminate 1.6 cm hypoattenuating mass in hepatic segment 2/3 (axial series 7, image 127). There is a 2.2 cm hypoattenuating mass at the hepatic dome (axial series 7, image 101). Normal gallbladder.There is no biliary ductal dilation. Pancreas: Normal contours without ductal dilatation. No peripancreatic fluid collection. Spleen: Unremarkable.  Adrenals/Urinary Tract: --Adrenal glands: Unremarkable. --Right kidney/ureter: No hydronephrosis or radiopaque kidney stones. --Left kidney/ureter: No hydronephrosis or radiopaque kidney stones. --Urinary bladder: Unremarkable. Stomach/Bowel: --Stomach/Duodenum: No hiatal hernia or other gastric abnormality. Normal duodenal course and caliber. --Small bowel: Unremarkable. --Colon: There is underdistention of much of the patient's colon, specifically the cecum, ascending colon, and transverse colon. This limits evaluation at these levels. There is scattered colonic diverticula without CT evidence for diverticulitis. --Appendix: Normal. Lymphatic: --No retroperitoneal lymphadenopathy. --No mesenteric lymphadenopathy. --No pelvic or inguinal lymphadenopathy. Reproductive: Unremarkable Other: No ascites or free air. The abdominal wall is normal. Musculoskeletal. No acute displaced fractures. Review of the MIP images confirms the above findings. IMPRESSION: 1. There is no evidence for aortic dissection. 2. Postsurgical changes of both lungs related to prior wedge resection there is some consolidation versus atelectasis in the right upper lobe as well as hyperdense pleural based nodules especially on the right. Comparison to outside prior studies or repeat CT chest in 3 months is recommended to confirm stability. 3. Indeterminate hepatic masses in the right left hepatic lobes. A dedicated follow-up outpatient contrast enhanced MRI is recommended for further evaluation. 4. Colonic diverticulosis without CT evidence for diverticulitis. Aortic Atherosclerosis (ICD10-I70.0). Electronically Signed   By: Katherine Mantle M.D.   On: 06/30/2019 00:28    Procedures .Critical Care Performed by: Dierdre Forth, PA-C Authorized by: Dierdre Forth, PA-C   Critical care provider statement:    Critical care time (minutes):  45   Critical care time was exclusive of:  Separately billable procedures and treating  other patients and teaching time   Critical care was necessary to treat or prevent imminent or life-threatening deterioration of the following conditions:  Respiratory failure   Critical care was time spent personally by me on the following activities:  Discussions with consultants, evaluation of patient's response to treatment, examination of patient, ordering and performing treatments and interventions, ordering and review of laboratory studies, ordering and review of radiographic studies, pulse oximetry, re-evaluation of patient's condition, obtaining history from patient or surrogate and review of old charts   I assumed direction of critical care for this patient from another provider in my specialty: no     (including critical care time)  Medications Ordered in ED Medications  sodium chloride flush (NS) 0.9 % injection 3 mL (has no administration in time range)  methylPREDNISolone sodium succinate (SOLU-MEDROL) 125 mg/2 mL injection 60  mg (has no administration in time range)  ipratropium-albuterol (DUONEB) 0.5-2.5 (3) MG/3ML nebulizer solution 3 mL (has no administration in time range)  albuterol (PROVENTIL) (2.5 MG/3ML) 0.083% nebulizer solution 2.5 mg (has no administration in time range)  budesonide (PULMICORT) nebulizer solution 0.25 mg (has no administration in time range)  enoxaparin (LOVENOX) injection 40 mg (has no administration in time range)  cefTRIAXone (ROCEPHIN) 1 g in sodium chloride 0.9 % 100 mL IVPB (has no administration in time range)  azithromycin (ZITHROMAX) 500 mg in sodium chloride 0.9 % 250 mL IVPB (has no administration in time range)  nicotine (NICODERM CQ - dosed in mg/24 hr) patch 7 mg (has no administration in time range)  albuterol (PROVENTIL) (2.5 MG/3ML) 0.083% nebulizer solution 5 mg (has no administration in time range)  sodium chloride 0.9 % bolus 500 mL (0 mLs Intravenous Stopped 06/29/19 2347)  albuterol (PROVENTIL) (2.5 MG/3ML) 0.083% nebulizer solution  5 mg (5 mg Nebulization Given 06/29/19 2305)  ipratropium (ATROVENT) nebulizer solution 0.5 mg (0.5 mg Nebulization Given 06/29/19 2305)  iohexol (OMNIPAQUE) 350 MG/ML injection 100 mL (100 mLs Intravenous Contrast Given 06/30/19 0009)  ipratropium (ATROVENT) nebulizer solution 0.5 mg (0.5 mg Nebulization Given 06/30/19 0058)  albuterol (PROVENTIL) (2.5 MG/3ML) 0.083% nebulizer solution 5 mg (5 mg Nebulization Given 06/30/19 0058)  methylPREDNISolone sodium succinate (SOLU-MEDROL) 125 mg/2 mL injection 125 mg (125 mg Intravenous Given 06/30/19 0058)    ED Course  I have reviewed the triage vital signs and the nursing notes.  Pertinent labs & imaging results that were available during my care of the patient were reviewed by me and considered in my medical decision making (see chart for details).  Clinical Course as of Jun 29 344  Sun Jun 30, 2019  0000 Hypotension on arrival.  Fluids given  BP(!): 88/59 [HM]  0345 WNL  B Natriuretic Peptide: 19.3 [HM]  0345 WNL  Troponin I (High Sensitivity): 6 [HM]  0346 Noted  SARS Coronavirus 2: NEGATIVE [HM]    Clinical Course User Index [HM] Muthersbaugh, Boyd Kerbs   MDM Rules/Calculators/A&P                      Patient presents with acute onset shortness of breath, near syncope, lightheadedness.  Initial triage found patient to be hypotensive and diaphoretic.  Blood pressure improved when supine.    Bedside ultrasound by resident Dr. Gloris Manchester without evidence of pericardial effusion, AAA.  Good squeeze on cardiac ultrasound.  Patient does have intact peripheral pulses and sensation.  Concern for possible COPD exacerbation versus dissection versus ACS versus Covid.  Work-up started.  1:15 AM  Patient continues to have some increased work of breathing.  He also continues to need supplemental oxygen to maintain his oxygen saturations.  Suspect COPD exacerbation.  Labs reassuring.  Initial troponin negative.  EKG nonischemic.  No elevation in  AST/ALT or lipase.  Abdomen soft and nontender on repeat exam.  Covid negative.  CT scan without evidence of aortic dissection.  Of note, patient does have several lesions in his liver.  Discussed the need for close outpatient follow-up with patient.  I personally evaluated these images.  Given ongoing shortness of breath and hypoxia patient will need admission.  No primary care here in the area.  2:32 AM Discussed patient's case with hospitalist, Dr. Loney Loh.  I have recommended admission and patient (and family if present) agree with this plan. Admitting physician will place admission orders.    The patient  was discussed with and seen by Dr. Tamera Punt and Dr. Dina Rich who agrees with the treatment plan.   Final Clinical Impression(s) / ED Diagnoses Final diagnoses:  Acute respiratory failure with hypoxia (HCC)  COPD exacerbation (HCC)  Near syncope  Hypotension, unspecified hypotension type    Rx / DC Orders ED Discharge Orders    None       Muthersbaugh, Gwenlyn Perking 06/30/19 0347    Merryl Hacker, MD 06/30/19 [phone]

## 2019-06-29 NOTE — ED Notes (Signed)
Placed on 4L [place].

## 2019-06-29 NOTE — ED Triage Notes (Signed)
Pt c/o SOB that started 30 minutes ago while praying. Pt c/o weakness and is diaphoretic. Hx of asthma, COPD and HTN.

## 2019-06-29 NOTE — ED Notes (Signed)
Provider at bedside

## 2019-06-30 ENCOUNTER — Inpatient Hospital Stay (HOSPITAL_COMMUNITY): Payer: Medicaid Other

## 2019-06-30 ENCOUNTER — Encounter (HOSPITAL_COMMUNITY): Payer: Self-pay | Admitting: Internal Medicine

## 2019-06-30 ENCOUNTER — Emergency Department (HOSPITAL_COMMUNITY): Payer: Medicaid Other

## 2019-06-30 DIAGNOSIS — F17210 Nicotine dependence, cigarettes, uncomplicated: Secondary | ICD-10-CM | POA: Diagnosis present

## 2019-06-30 DIAGNOSIS — J441 Chronic obstructive pulmonary disease with (acute) exacerbation: Secondary | ICD-10-CM | POA: Diagnosis present

## 2019-06-30 DIAGNOSIS — R0602 Shortness of breath: Secondary | ICD-10-CM | POA: Diagnosis present

## 2019-06-30 DIAGNOSIS — I10 Essential (primary) hypertension: Secondary | ICD-10-CM | POA: Diagnosis present

## 2019-06-30 DIAGNOSIS — J9601 Acute respiratory failure with hypoxia: Secondary | ICD-10-CM | POA: Diagnosis present

## 2019-06-30 DIAGNOSIS — Z888 Allergy status to other drugs, medicaments and biological substances status: Secondary | ICD-10-CM | POA: Diagnosis not present

## 2019-06-30 DIAGNOSIS — Z7951 Long term (current) use of inhaled steroids: Secondary | ICD-10-CM | POA: Diagnosis not present

## 2019-06-30 DIAGNOSIS — Z86010 Personal history of colonic polyps: Secondary | ICD-10-CM | POA: Diagnosis not present

## 2019-06-30 DIAGNOSIS — R079 Chest pain, unspecified: Secondary | ICD-10-CM | POA: Diagnosis not present

## 2019-06-30 DIAGNOSIS — N529 Male erectile dysfunction, unspecified: Secondary | ICD-10-CM | POA: Diagnosis present

## 2019-06-30 DIAGNOSIS — I959 Hypotension, unspecified: Secondary | ICD-10-CM | POA: Diagnosis present

## 2019-06-30 DIAGNOSIS — R222 Localized swelling, mass and lump, trunk: Secondary | ICD-10-CM | POA: Diagnosis not present

## 2019-06-30 DIAGNOSIS — H209 Unspecified iridocyclitis: Secondary | ICD-10-CM | POA: Diagnosis present

## 2019-06-30 DIAGNOSIS — Z20822 Contact with and (suspected) exposure to covid-19: Secondary | ICD-10-CM | POA: Diagnosis present

## 2019-06-30 DIAGNOSIS — R55 Syncope and collapse: Secondary | ICD-10-CM

## 2019-06-30 DIAGNOSIS — J440 Chronic obstructive pulmonary disease with acute lower respiratory infection: Secondary | ICD-10-CM | POA: Diagnosis present

## 2019-06-30 DIAGNOSIS — Z8249 Family history of ischemic heart disease and other diseases of the circulatory system: Secondary | ICD-10-CM | POA: Diagnosis not present

## 2019-06-30 DIAGNOSIS — Z79899 Other long term (current) drug therapy: Secondary | ICD-10-CM | POA: Diagnosis not present

## 2019-06-30 DIAGNOSIS — M1990 Unspecified osteoarthritis, unspecified site: Secondary | ICD-10-CM | POA: Diagnosis present

## 2019-06-30 DIAGNOSIS — Z91013 Allergy to seafood: Secondary | ICD-10-CM | POA: Diagnosis not present

## 2019-06-30 LAB — D-DIMER, QUANTITATIVE: D-Dimer, Quant: <0.27 ug/mL-FEU (ref 0.00–0.50)

## 2019-06-30 LAB — BRAIN NATRIURETIC PEPTIDE: B Natriuretic Peptide: 19.3 pg/mL (ref 0.0–100.0)

## 2019-06-30 LAB — SARS CORONAVIRUS 2 BY RT PCR (HOSPITAL ORDER, PERFORMED IN ~~LOC~~ HOSPITAL LAB): SARS Coronavirus 2: NEGATIVE

## 2019-06-30 LAB — HIV ANTIBODY (ROUTINE TESTING W REFLEX): HIV Screen 4th Generation wRfx: NONREACTIVE

## 2019-06-30 LAB — TROPONIN I (HIGH SENSITIVITY): Troponin I (High Sensitivity): 6 ng/L (ref <18)

## 2019-06-30 LAB — PROCALCITONIN: Procalcitonin: <0.1 ng/mL

## 2019-06-30 MED ORDER — ENOXAPARIN SODIUM 40 MG/0.4ML ~~LOC~~ SOLN
40.0000 mg | SUBCUTANEOUS | Status: DC
  Administered 2019-06-30 – 2019-07-01 (×2): 40 mg via SUBCUTANEOUS
  Filled 2019-06-30 (×2): qty 0.4

## 2019-06-30 MED ORDER — NICOTINE 7 MG/24HR TD PT24
7.0000 mg | MEDICATED_PATCH | Freq: Every day | TRANSDERMAL | Status: DC
  Administered 2019-06-30 – 2019-07-01 (×2): 7 mg via TRANSDERMAL
  Filled 2019-06-30 (×2): qty 1

## 2019-06-30 MED ORDER — ALBUTEROL SULFATE (2.5 MG/3ML) 0.083% IN NEBU: 2.5000 mg | INHALATION_SOLUTION | RESPIRATORY_TRACT | Status: DC | PRN

## 2019-06-30 MED ORDER — SODIUM CHLORIDE 0.9 % IV SOLN
500.0000 mg | Freq: Every day | INTRAVENOUS | Status: DC
  Administered 2019-06-30 (×2): 500 mg via INTRAVENOUS
  Filled 2019-06-30 (×2): qty 500

## 2019-06-30 MED ORDER — METHYLPREDNISOLONE SODIUM SUCC 125 MG IJ SOLR
60.0000 mg | Freq: Four times a day (QID) | INTRAMUSCULAR | Status: DC
  Administered 2019-06-30: 60 mg via INTRAVENOUS
  Filled 2019-06-30: qty 2

## 2019-06-30 MED ORDER — PANTOPRAZOLE SODIUM 40 MG PO TBEC
40.0000 mg | DELAYED_RELEASE_TABLET | Freq: Every day | ORAL | Status: DC
  Administered 2019-06-30 – 2019-07-01 (×2): 40 mg via ORAL
  Filled 2019-06-30 (×2): qty 1

## 2019-06-30 MED ORDER — GUAIFENESIN 100 MG/5ML PO SOLN
5.0000 mL | ORAL | Status: DC | PRN
  Administered 2019-06-30 – 2019-07-01 (×4): 100 mg via ORAL
  Filled 2019-06-30 (×4): qty 5

## 2019-06-30 MED ORDER — OXYCODONE HCL 5 MG PO TABS
20.0000 mg | ORAL_TABLET | ORAL | Status: DC | PRN
  Administered 2019-06-30 – 2019-07-01 (×7): 20 mg via ORAL
  Filled 2019-06-30 (×7): qty 4

## 2019-06-30 MED ORDER — IPRATROPIUM-ALBUTEROL 0.5-2.5 (3) MG/3ML IN SOLN
3.0000 mL | Freq: Four times a day (QID) | RESPIRATORY_TRACT | Status: DC
  Administered 2019-06-30 – 2019-07-01 (×6): 3 mL via RESPIRATORY_TRACT
  Filled 2019-06-30 (×5): qty 3

## 2019-06-30 MED ORDER — METHYLPREDNISOLONE SODIUM SUCC 125 MG IJ SOLR
60.0000 mg | Freq: Three times a day (TID) | INTRAMUSCULAR | Status: DC
  Administered 2019-06-30 – 2019-07-01 (×3): 60 mg via INTRAVENOUS
  Filled 2019-06-30 (×4): qty 2

## 2019-06-30 MED ORDER — METHYLPREDNISOLONE SODIUM SUCC 125 MG IJ SOLR
125.0000 mg | Freq: Once | INTRAMUSCULAR | Status: AC
  Administered 2019-06-30: 125 mg via INTRAVENOUS
  Filled 2019-06-30: qty 2

## 2019-06-30 MED ORDER — AMLODIPINE BESYLATE 10 MG PO TABS
10.0000 mg | ORAL_TABLET | Freq: Every day | ORAL | Status: DC
  Administered 2019-06-30 – 2019-07-01 (×2): 10 mg via ORAL
  Filled 2019-06-30 (×2): qty 1

## 2019-06-30 MED ORDER — SODIUM CHLORIDE 0.9 % IV SOLN
1.0000 g | INTRAVENOUS | Status: DC
  Administered 2019-06-30 – 2019-07-01 (×2): 1 g via INTRAVENOUS
  Filled 2019-06-30 (×2): qty 10

## 2019-06-30 MED ORDER — ALBUTEROL SULFATE HFA 108 (90 BASE) MCG/ACT IN AERS: 2.0000 | INHALATION_SPRAY | RESPIRATORY_TRACT | Status: DC | PRN

## 2019-06-30 MED ORDER — ALBUTEROL SULFATE (2.5 MG/3ML) 0.083% IN NEBU
5.0000 mg | INHALATION_SOLUTION | Freq: Once | RESPIRATORY_TRACT | Status: AC
  Administered 2019-06-30: 5 mg via RESPIRATORY_TRACT
  Filled 2019-06-30: qty 6

## 2019-06-30 MED ORDER — IOHEXOL 350 MG/ML SOLN
100.0000 mL | Freq: Once | INTRAVENOUS | Status: AC | PRN
  Administered 2019-06-30: 100 mL via INTRAVENOUS

## 2019-06-30 MED ORDER — IPRATROPIUM BROMIDE 0.02 % IN SOLN
0.5000 mg | Freq: Once | RESPIRATORY_TRACT | Status: AC
  Administered 2019-06-30: 0.5 mg via RESPIRATORY_TRACT
  Filled 2019-06-30: qty 2.5

## 2019-06-30 MED ORDER — BUDESONIDE 0.25 MG/2ML IN SUSP
0.2500 mg | Freq: Two times a day (BID) | RESPIRATORY_TRACT | Status: DC
  Administered 2019-06-30 – 2019-07-01 (×3): 0.25 mg via RESPIRATORY_TRACT
  Filled 2019-06-30 (×4): qty 2

## 2019-06-30 MED ORDER — GABAPENTIN 300 MG PO CAPS
300.0000 mg | ORAL_CAPSULE | Freq: Three times a day (TID) | ORAL | Status: DC
  Administered 2019-06-30 – 2019-07-01 (×4): 300 mg via ORAL
  Filled 2019-06-30 (×4): qty 1

## 2019-06-30 NOTE — Progress Notes (Signed)
PROGRESS NOTE                                                                                                                                                                                                             Patient Demographics:    Glenn [name], is a 59 y.o. male, DOB - 22-Jul-1960, MBW:[id number]  Admit date - 06/29/2019   Admitting Physician John Giovanni, MD  Outpatient Primary MD for the patient is Fleet Contras, MD  LOS - 0   Chief Complaint  Patient presents with  . Shortness of Breath  . Abdominal Pain       Brief Narrative    This is a no charge note as patient was seen and admitted earlier today by Dr Ulyess Blossom, chart, imaging and labs were reviewed.   HPI: Glenn [name] is a 59 y.o. male with medical history significant of COPD, hypertension, tobacco use presenting to the ED with complaint of shortness of breath and near syncope.  Patient states last night he was not doing much and became all of a sudden short of breath.  He was wheezing and started sweating.  He also started having some substernal chest/lower abdominal chest pain at that time.  Had a near syncopal event.  He has been coughing for the past few days.  Denies any fevers, nausea, vomiting, or diarrhea.  States his chest/abdominal pain have now resolved and breathing has improved since he has been in the emergency room.  He smokes cigarettes and a pack lasts him about a week.  Reports compliance with his home inhalers.  ED Course: Upon arrival, hypotensive, diaphoretic, and wheezing, increased work of breathing.  Hypoxic to the 80s.  Bedside ultrasound done by ED provider without evidence of pericardial effusion.  Afebrile.  Labs showing no leukocytosis.  Lipase and LFTs normal.  SARS-CoV-2 PCR test negative.  EKG with very mild ST abnormality in inferior and lateral leads.  Initial high-sensitivity troponin negative, repeat pending.  CT angiogram chest/abdomen/pelvis  negative for aortic dissection.  Showing postsurgical changes of both lungs related to prior wedge resection with some consolidation versus atelectasis in the upper lobe as well as hyperdense pleural-based nodule especially on the right.  Radiologist recommending comparison to outside prior studies or repeat CT chest in 3 months to confirm stability.  Also showing indeterminate hepatic masses in  the right left hepatic lobes.  Outpatient contrast enhanced MRI is recommended for further evaluation.  Patient received bronchodilator treatments, IV Solu-Medrol 125 mg, and 500 cc fluid bolus.   Subjective:    Glenn [name] today's and fatigue, still reports some dyspnea, which was significant with activity today.    Assessment  & Plan :    Principal Problem:   COPD with acute exacerbation (HCC) Active Problems:   Acute respiratory failure with hypoxemia (HCC)   Hypotension   Near syncope   Pleural nodule  Acute hypoxic respiratory failure secondary to acute COPD exacerbation:  - Hypoxic to the 80s and initially required supplemental oxygen.  Work of breathing has improved after bronchodilator treatments and Solu-Medrol 125 mg.  Currently satting in the mid 90s on room air but does become tachypneic with respiratory rate up to 30s with minimal exertion such as sitting up in the bed.  Continues to have wheezing. -Patient remains awake, significantly dyspneic currently, with some significant wheezing, so we will need to continue with IV Solu-Medrol, will make it 60 mg every 8 hours, continue with the scheduled duo nebs, albuterol as needed, and Pulmicort, severe disease will continue with IV antibiotics as well.  Hypotension/near syncope/ ?PNA: Hypotensive to the 70s on arrival.  No signs of acute blood loss- hemoglobin normal and not tachycardic on arrival.  Bedside ultrasound done by ED provider without evidence of pericardial effusion.  CT angiogram chest negative for aortic dissection and no PE  reported.  EKG with very mild ST abnormality in inferior and lateral leads, possibly repolarization abnormality.  ACS less likely as high-sensitivity troponin negative.  CT with question of some consolidation versus atelectasis.  However, pneumonia less likely given no fever or leukocytosis.  SARS-CoV-2 PCR test negative.  Possibly a vasovagal event. Blood pressure now improved after a 500 cc fluid bolus.  Currently normotensive. -Treat with ceftriaxone and azithromycin at this time.  Check procalcitonin level.  Second set of troponin pending.  Admit to progressive care unit and continue cardiac monitoring.    Pleural nodule: Hyperdense pleural-based nodule especially on the right reported on CT. Radiologist recommending comparison to outside prior studies or repeat CT chest in 3 months to confirm stability.   Hepatic masses: CT showing indeterminate hepatic masses in the right left hepatic lobes.  Radiologist recommending outpatient contrast enhanced MRI for further evaluation.  Tobacco use: NicoDerm patch and continue to counsel on smoking cessation.     COVID-19 Labs  Recent Labs    06/30/19 0925  DDIMER <0.27    Lab Results  Component Value Date   SARSCOV2NAA NEGATIVE 06/29/2019   SARSCOV2NAA Not Detected 05/20/2019     Code Status : Full  Family Communication  : None at bedside  Disposition Plan  :  Status is: Inpatient  Remains inpatient appropriate because:IV treatments appropriate due to intensity of illness or inability to take PO   Dispo: The patient is from: Home              Anticipated d/c is to: Home              Anticipated d/c date is: 2 days              Patient currently is not medically stable to d/c.          Consults  :  None  Procedures  : None  DVT Prophylaxis  :  [place] lovenox  Lab Results  Component Value Date   PLT  294 06/29/2019    Antibiotics  :    Anti-infectives (From admission, onward)   Start     Dose/Rate Route  Frequency Ordered Stop   06/30/19 0300  azithromycin (ZITHROMAX) 500 mg in sodium chloride 0.9 % 250 mL IVPB     500 mg 250 mL/hr over 60 Minutes Intravenous Daily at bedtime 06/30/19 0247     06/30/19 0230  cefTRIAXone (ROCEPHIN) 1 g in sodium chloride 0.9 % 100 mL IVPB     1 g 200 mL/hr over 30 Minutes Intravenous Every 24 hours 06/30/19 0217 07/05/19 0229        Objective:   Vitals:   06/30/19 0800 06/30/19 1154 06/30/19 1155 06/30/19 1227  BP: 108/65   (!) 131/95  Pulse:    (!) 108  Resp:  18 18   Temp:  98.3 F (36.8 C)    TempSrc:  Oral    SpO2:      Weight:      Height:        Wt Readings from Last 3 Encounters:  06/30/19 60.6 kg  03/26/19 65.3 kg  11/20/18 65.7 kg     Intake/Output Summary (Last 24 hours) at 06/30/2019 1424 Last data filed at 06/30/2019 0941 Gross per 24 hour  Intake 720 ml  Output 300 ml  Net 420 ml     Physical Exam  Awake Alert, Oriented X 3, No new F.N deficits, Normal affect Symmetrical Chest wall movement, Minister entry with scattered wheezing, with minimal increased work of breathing . RRR,No Gallops,Rubs or new Murmurs, No Parasternal Heave +ve B.Sounds, Abd Soft, No tenderness,No rebound - guarding or rigidity. No Cyanosis, Clubbing or edema, No new Rash or bruise      Data Review:    CBC Recent Labs  Lab 06/29/19 2245  WBC 9.8  HGB 15.1  HCT 47.4  PLT 294  MCV 91.9  MCH 29.3  MCHC 31.9  RDW 13.3    Chemistries  Recent Labs  Lab 06/29/19 2254  NA 140  K 3.9  CL 105  CO2 28  GLUCOSE 123*  BUN 13  CREATININE 1.05  CALCIUM 8.4*  AST 22  ALT 19  ALKPHOS 49  BILITOT 0.7   ------------------------------------------------------------------------------------------------------------------ No results for input(s): CHOL, HDL, LDLCALC, TRIG, CHOLHDL, LDLDIRECT in the last 72 hours.  No results found for:  HGBA1C ------------------------------------------------------------------------------------------------------------------ No results for input(s): TSH, T4TOTAL, T3FREE, THYROIDAB in the last 72 hours.  Invalid input(s): FREET3 ------------------------------------------------------------------------------------------------------------------ No results for input(s): VITAMINB12, FOLATE, FERRITIN, TIBC, IRON, RETICCTPCT in the last 72 hours.  Coagulation profile No results for input(s): INR, PROTIME in the last 168 hours.  Recent Labs    06/30/19 0925  DDIMER <0.27    Cardiac Enzymes No results for input(s): CKMB, TROPONINI, MYOGLOBIN in the last 168 hours.  Invalid input(s): CK ------------------------------------------------------------------------------------------------------------------    Component Value Date/Time   BNP 19.3 06/29/2019 2245    Inpatient Medications  Scheduled Meds: . budesonide (PULMICORT) nebulizer solution  0.25 mg Nebulization BID  . enoxaparin (LOVENOX) injection  40 mg Subcutaneous Q24H  . ipratropium-albuterol  3 mL Nebulization Q6H  . methylPREDNISolone (SOLU-MEDROL) injection  60 mg Intravenous Q6H  . nicotine  7 mg Transdermal Daily  . pantoprazole  40 mg Oral Daily  . sodium chloride flush  3 mL Intravenous Once   Continuous Infusions: . azithromycin 500 mg (06/30/19 0542)  . cefTRIAXone (ROCEPHIN)  IV Stopped (06/30/19 0434)   PRN Meds:.albuterol, guaiFENesin, oxyCODONE  Micro Results Recent Results (  from the past 240 hour(s))  SARS Coronavirus 2 by RT PCR (hospital order, performed in [facility] hospital lab) Nasopharyngeal Nasopharyngeal Swab     Status: None   Collection Time: 06/29/19 11:08 PM   Specimen: Nasopharyngeal Swab  Result Value Ref Range Status   SARS Coronavirus 2 NEGATIVE NEGATIVE Final    Comment: (NOTE) SARS-CoV-2 target nucleic acids are NOT DETECTED. The SARS-CoV-2 RNA is generally detectable in upper and  lower respiratory specimens during the acute phase of infection. The lowest concentration of SARS-CoV-2 viral copies this assay can detect is 250 copies / mL. A negative result does not preclude SARS-CoV-2 infection and should not be used as the sole basis for treatment or other patient management decisions.  A negative result may occur with improper specimen collection / handling, submission of specimen other than nasopharyngeal swab, presence of viral mutation(s) within the areas targeted by this assay, and inadequate number of viral copies (<250 copies / mL). A negative result must be combined with clinical observations, patient history, and epidemiological information. Fact Sheet for Patients:   StrictlyIdeas.no Fact Sheet for Healthcare Providers: BankingDealers.co.za This test is not yet approved or cleared  by the [place] FDA and has been authorized for detection and/or diagnosis of SARS-CoV-2 by FDA under an Emergency Use Authorization (EUA).  This EUA will remain in effect (meaning this test can be used) for the duration of the COVID-19 declaration under Section 564(b)(1) of the Act, 21 U.S.C. section 360bbb-3(b)(1), unless the authorization is terminated or revoked sooner. Performed at [facility], [place] [address], [place], [place] [address]     Radiology Reports DG Chest [place] 1 View  Result Date: 06/29/2019 CLINICAL DATA:  Shortness of breath EXAM: PORTABLE CHEST 1 VIEW COMPARISON:  March 06, 2019 FINDINGS: There is elevation of the left hemidiaphragm. Bibasilar airspace opacities are again noted and favored to represent areas of atelectasis and scarring. The patient is likely status post prior wedge resection in the left upper lobe. Surgical staples are noted along the patient's left mediastinum. There is no pneumothorax. No focal infiltrate. There are emphysematous changes bilaterally. The patient appears to be  status post prior wedge resection in the right upper lung zone. IMPRESSION: No acute cardiopulmonary process. Chronic findings as detailed above. Electronically Signed   By: Constance Holster M.D.   On: 06/29/2019 23:17   CT Angio Chest/Abd/Pel for Dissection W and/or Wo Contrast  Result Date: 06/30/2019 CLINICAL DATA:  Chest pain EXAM: CT ANGIOGRAPHY CHEST, ABDOMEN AND PELVIS TECHNIQUE: Non-contrast CT of the chest was initially obtained. Multidetector CT imaging through the chest, abdomen and pelvis was performed using the standard protocol during bolus administration of intravenous contrast. Multiplanar reconstructed images and MIPs were obtained and reviewed to evaluate the vascular anatomy. CONTRAST:  149mL OMNIPAQUE IOHEXOL 350 MG/ML SOLN COMPARISON:  None. FINDINGS: CTA CHEST FINDINGS Cardiovascular: There is no evidence for thoracic aortic dissection. Atherosclerotic changes are noted of the thoracic aorta without evidence for an aneurysm. The heart size is normal without evidence for a significant pericardial effusion. There is no pulmonary embolism. Mediastinum/Nodes: --No mediastinal or hilar lymphadenopathy. --No axillary lymphadenopathy. --No supraclavicular lymphadenopathy. --Normal thyroid gland. --The esophagus is unremarkable Lungs/Pleura: There are postsurgical changes bilaterally related to prior wedge resection. There are areas of scarring atelectasis bilaterally. There is hyperdense pleural base nodularity involving the right hemithorax. There is some atelectasis versus consolidation involving the right upper lobe. Architectural distortion is noted bilaterally resulting in volume loss. There is  bilateral bronchial wall thickening with minimal mucous plugging. There is no pneumothorax or significant pleural effusion. Musculoskeletal: No chest wall abnormality. No acute or significant osseous findings. Review of the MIP images confirms the above findings. CTA ABDOMEN AND PELVIS FINDINGS  VASCULAR Aorta: Normal caliber aorta without aneurysm, dissection, vasculitis or significant stenosis. Celiac: Patent without evidence of aneurysm, dissection, vasculitis or significant stenosis. SMA: There is a replaced common hepatic artery arising from the SMA. Renals: Both renal arteries are patent without evidence of aneurysm, dissection, vasculitis, fibromuscular dysplasia or significant stenosis. IMA: Patent without evidence of aneurysm, dissection, vasculitis or significant stenosis. Inflow: Patent without evidence of aneurysm, dissection, vasculitis or significant stenosis. Veins: No obvious venous abnormality within the limitations of this arterial phase study. Review of the MIP images confirms the above findings. NON-VASCULAR Hepatobiliary: There is an indeterminate 1.6 cm hypoattenuating mass in hepatic segment 2/3 (axial series 7, image 127). There is a 2.2 cm hypoattenuating mass at the hepatic dome (axial series 7, image 101). Normal gallbladder.There is no biliary ductal dilation. Pancreas: Normal contours without ductal dilatation. No peripancreatic fluid collection. Spleen: Unremarkable. Adrenals/Urinary Tract: --Adrenal glands: Unremarkable. --Right kidney/ureter: No hydronephrosis or radiopaque kidney stones. --Left kidney/ureter: No hydronephrosis or radiopaque kidney stones. --Urinary bladder: Unremarkable. Stomach/Bowel: --Stomach/Duodenum: No hiatal hernia or other gastric abnormality. Normal duodenal course and caliber. --Small bowel: Unremarkable. --Colon: There is underdistention of much of the patient's colon, specifically the cecum, ascending colon, and transverse colon. This limits evaluation at these levels. There is scattered colonic diverticula without CT evidence for diverticulitis. --Appendix: Normal. Lymphatic: --No retroperitoneal lymphadenopathy. --No mesenteric lymphadenopathy. --No pelvic or inguinal lymphadenopathy. Reproductive: Unremarkable Other: No ascites or free air.  The abdominal wall is normal. Musculoskeletal. No acute displaced fractures. Review of the MIP images confirms the above findings. IMPRESSION: 1. There is no evidence for aortic dissection. 2. Postsurgical changes of both lungs related to prior wedge resection there is some consolidation versus atelectasis in the right upper lobe as well as hyperdense pleural based nodules especially on the right. Comparison to outside prior studies or repeat CT chest in 3 months is recommended to confirm stability. 3. Indeterminate hepatic masses in the right left hepatic lobes. A dedicated follow-up outpatient contrast enhanced MRI is recommended for further evaluation. 4. Colonic diverticulosis without CT evidence for diverticulitis. Aortic Atherosclerosis (ICD10-I70.0). Electronically Signed   By: Katherine Mantle M.D.   On: 06/30/2019 00:28   VAS [place] LOWER EXTREMITY VENOUS (DVT)  Result Date: 06/30/2019  Lower Venous DVTStudy Indications: Syncope, chest pain, hypotension after multiple [employer] car rides.  Comparison Study: No prior study on file for comparison Performing Technologist: Sherren Kerns RVS  Examination Guidelines: A complete evaluation includes B-mode imaging, spectral Doppler, color Doppler, and power Doppler as needed of all accessible portions of each vessel. Bilateral testing is considered an integral part of a complete examination. Limited examinations for reoccurring indications may be performed as noted. The reflux portion of the exam is performed with the patient in reverse Trendelenburg.  +---------+---------------+---------+-----------+----------+--------------+ RIGHT    CompressibilityPhasicitySpontaneityPropertiesThrombus Aging +---------+---------------+---------+-----------+----------+--------------+ CFV      Full           Yes      Yes                                 +---------+---------------+---------+-----------+----------+--------------+ SFJ      Full                                                         +---------+---------------+---------+-----------+----------+--------------+  FV Prox  Full                                                        +---------+---------------+---------+-----------+----------+--------------+ FV Mid   Full                                                        +---------+---------------+---------+-----------+----------+--------------+ FV DistalFull                                                        +---------+---------------+---------+-----------+----------+--------------+ PFV      Full                                                        +---------+---------------+---------+-----------+----------+--------------+ POP      Full           Yes      Yes                                 +---------+---------------+---------+-----------+----------+--------------+ PTV      Full                                                        +---------+---------------+---------+-----------+----------+--------------+ PERO     Full                                                        +---------+---------------+---------+-----------+----------+--------------+   +---------+---------------+---------+-----------+----------+--------------+ LEFT     CompressibilityPhasicitySpontaneityPropertiesThrombus Aging +---------+---------------+---------+-----------+----------+--------------+ CFV      Full           Yes      Yes                                 +---------+---------------+---------+-----------+----------+--------------+ SFJ      Full                                                        +---------+---------------+---------+-----------+----------+--------------+ FV Prox  Full                                                        +---------+---------------+---------+-----------+----------+--------------+  FV Mid   Full                                                         +---------+---------------+---------+-----------+----------+--------------+ FV DistalFull                                                        +---------+---------------+---------+-----------+----------+--------------+ PFV      Full                                                        +---------+---------------+---------+-----------+----------+--------------+ POP      Full           Yes      Yes                                 +---------+---------------+---------+-----------+----------+--------------+ PTV      Full                                                        +---------+---------------+---------+-----------+----------+--------------+ PERO     Full                                                        +---------+---------------+---------+-----------+----------+--------------+     Summary: BILATERAL: - No evidence of deep vein thrombosis seen in the lower extremities, bilaterally. -   *See table(s) above for measurements and observations.    Preliminary      Huey Bienenstockawood Kamran Coker M.D on 06/30/2019 at 2:24 PM  Between 7am to 7pm - Pager - [phone]  After 7pm go to [email or web address] - password [email or web address]  [facility] -  Office  [phone]

## 2019-06-30 NOTE — H&P (Signed)
History and Physical    Glenn [name] TFT:[id number] DOB: 02/02/1961 DOA: 06/29/2019  PCP: Fleet Contras, MD Patient coming from: Home  Chief Complaint: Shortness of breath, near syncope  HPI: Glenn [name] is a 59 y.o. male with medical history significant of COPD, hypertension, tobacco use presenting to the ED with complaint of shortness of breath and near syncope.  Patient states last night he was not doing much and became all of a sudden short of breath.  He was wheezing and started sweating.  He also started having some substernal chest/lower abdominal chest pain at that time.  Had a near syncopal event.  He has been coughing for the past few days.  Denies any fevers, nausea, vomiting, or diarrhea.  States his chest/abdominal pain have now resolved and breathing has improved since he has been in the emergency room.  He smokes cigarettes and a pack lasts him about a week.  Reports compliance with his home inhalers.  ED Course: Upon arrival, hypotensive, diaphoretic, and wheezing, increased work of breathing.  Hypoxic to the 80s.  Bedside ultrasound done by ED provider without evidence of pericardial effusion.  Afebrile.  Labs showing no leukocytosis.  Lipase and LFTs normal.  SARS-CoV-2 PCR test negative.  EKG with very mild ST abnormality in inferior and lateral leads.  Initial high-sensitivity troponin negative, repeat pending.  CT angiogram chest/abdomen/pelvis negative for aortic dissection.  Showing postsurgical changes of both lungs related to prior wedge resection with some consolidation versus atelectasis in the upper lobe as well as hyperdense pleural-based nodule especially on the right.  Radiologist recommending comparison to outside prior studies or repeat CT chest in 3 months to confirm stability.  Also showing indeterminate hepatic masses in the right left hepatic lobes.  Outpatient contrast enhanced MRI is recommended for further evaluation.  Patient received bronchodilator treatments,  IV Solu-Medrol 125 mg, and 500 cc fluid bolus.  Review of Systems:  All systems reviewed and apart from history of presenting illness, are negative.  Past Medical History:  Diagnosis Date  . Arthritis   . Asthma   . COPD (chronic obstructive pulmonary disease) (HCC)   . ED (erectile dysfunction)   . Hypertension     Past Surgical History:  Procedure Laterality Date  . LUNG SURGERY     removed tissue     reports that he has been smoking cigarettes. He has a 17.00 pack-year smoking history. He has never used smokeless tobacco. He reports that he does not drink alcohol or use drugs.  Allergies  Allergen Reactions  . Ibuprofen     Abdominal sensitivity  . Shrimp [Shellfish Allergy] Itching  . Tylenol [Acetaminophen]     Abdominal sensitvity  . Lisinopril Rash    Tongue swelling    Family History  Problem Relation Age of Onset  . Hypertension Mother   . Diabetes Maternal Grandmother   . Colon cancer Neg Hx   . Esophageal cancer Neg Hx   . Rectal cancer Neg Hx   . Inflammatory bowel disease Neg Hx   . Liver disease Neg Hx   . Pancreatic cancer Neg Hx     Prior to Admission medications   Medication Sig Start Date End Date Taking? Authorizing Provider  albuterol (PROVENTIL HFA) 108 (90 Base) MCG/ACT inhaler Inhale 2 puffs into the lungs every 4 (four) hours as needed for wheezing or shortness of breath. 11/20/18   [name], MD  albuterol (PROVENTIL) (2.5 MG/3ML) 0.083% nebulizer solution PLEASE SEE ATTACHED FOR DETAILED DIRECTIONS  02/26/18   Nyoka CowdenWert, Michael B, MD  amLODipine (NORVASC) 10 MG tablet Take 10 mg by mouth daily.    [provider]  benzonatate (TESSALON) 100 MG capsule Take 1 capsule (100 mg total) by mouth every 8 (eight) hours. 03/06/19   Dahlia ByesBast, Traci A, NP  budesonide-formoterol (SYMBICORT) 160-4.5 MCG/ACT inhaler Inhale 2 puffs into the lungs 2 (two) times daily. 05/13/19   Nyoka CowdenWert, Michael B, MD  gabapentin (NEURONTIN) 300 MG capsule Take 1 capsule  (300 mg total) by mouth 3 (three) times daily. 02/09/18   Eustace MooreNelson, Yvonne Sue, MD  nitroGLYCERIN (NITROSTAT) 0.4 MG SL tablet Place 0.4 mg under the tongue every 5 (five) minutes as needed for chest pain.    [provider]  Oxycodone HCl 20 MG TABS Take 1 tablet by mouth every 4 (four) hours as needed. 07/25/18   [provider]  predniSONE (DELTASONE) 10 MG tablet Take  4 each am x 2 days,   2 each am x 2 days,  1 each am x 2 days and stop 03/26/19   Nyoka CowdenWert, Michael B, MD  sildenafil (VIAGRA) 100 MG tablet Take 100 mg by mouth daily as needed for erectile dysfunction.    [provider]    Physical Exam: Vitals:   06/29/19 2330 06/30/19 0000 06/30/19 0030 06/30/19 0100  BP: 122/82 96/60 122/83 127/89  Pulse: 88 93 97 95  Resp: (!) 21 18 (!) 24 (!) 22  Temp:      TempSrc:      SpO2: 100% 95% 97% 100%  Weight:      Height:        Physical Exam  Constitutional: He is oriented to person, place, and time. He appears well-developed and well-nourished. No distress.  HENT:  Head: Normocephalic.  Eyes: Right eye exhibits no discharge. Left eye exhibits no discharge.  Cardiovascular: Normal rate, regular rhythm and intact distal pulses.  Pulmonary/Chest: He is in respiratory distress. He has wheezes. He has no rales.  Tachypnea  Abdominal: Soft. Bowel sounds are normal. He exhibits no distension. There is no abdominal tenderness. There is no guarding.  Musculoskeletal:        General: No edema.     Cervical back: Neck supple.  Neurological: He is alert and oriented to person, place, and time.  Skin: Skin is warm and dry. He is not diaphoretic.    Labs on Admission: I have personally reviewed following labs and imaging studies  CBC: Recent Labs  Lab 06/29/19 2245  WBC 9.8  HGB 15.1  HCT 47.4  MCV 91.9  PLT 294   Basic Metabolic Panel: Recent Labs  Lab 06/29/19 2254  NA 140  K 3.9  CL 105  CO2 28  GLUCOSE 123*  BUN 13  CREATININE 1.05  CALCIUM 8.4*    GFR: Estimated Creatinine Clearance: 65.9 mL/min (by C-G formula based on SCr of 1.05 mg/dL). Liver Function Tests: Recent Labs  Lab 06/29/19 2254  AST 22  ALT 19  ALKPHOS 49  BILITOT 0.7  PROT 5.4*  ALBUMIN 3.0*   Recent Labs  Lab 06/29/19 2254  LIPASE 18   No results for input(s): AMMONIA in the last 168 hours. Coagulation Profile: No results for input(s): INR, PROTIME in the last 168 hours. Cardiac Enzymes: No results for input(s): CKTOTAL, CKMB, CKMBINDEX, TROPONINI in the last 168 hours. BNP (last 3 results) No results for input(s): PROBNP in the last 8760 hours. HbA1C: No results for input(s): HGBA1C in the last 72 hours.  CBG: No results for input(s): GLUCAP in the last 168 hours. Lipid Profile: No results for input(s): CHOL, HDL, LDLCALC, TRIG, CHOLHDL, LDLDIRECT in the last 72 hours. Thyroid Function Tests: No results for input(s): TSH, T4TOTAL, FREET4, T3FREE, THYROIDAB in the last 72 hours. Anemia Panel: No results for input(s): VITAMINB12, FOLATE, FERRITIN, TIBC, IRON, RETICCTPCT in the last 72 hours. Urine analysis: No results found for: COLORURINE, APPEARANCEUR, [place], [place], GLUCOSEU, HGBUR, BILIRUBINUR, KETONESUR, PROTEINUR, UROBILINOGEN, NITRITE, LEUKOCYTESUR  Radiological Exams on Admission: DG Chest Port 1 View  Result Date: 06/29/2019 CLINICAL DATA:  Shortness of breath EXAM: PORTABLE CHEST 1 VIEW COMPARISON:  March 06, 2019 FINDINGS: There is elevation of the left hemidiaphragm. Bibasilar airspace opacities are again noted and favored to represent areas of atelectasis and scarring. The patient is likely status post prior wedge resection in the left upper lobe. Surgical staples are noted along the patient's left mediastinum. There is no pneumothorax. No focal infiltrate. There are emphysematous changes bilaterally. The patient appears to be status post prior wedge resection in the right upper lung zone. IMPRESSION: No acute cardiopulmonary  process. Chronic findings as detailed above. Electronically Signed   By: Constance Holster M.D.   On: 06/29/2019 23:17   CT Angio Chest/Abd/Pel for Dissection W and/or Wo Contrast  Result Date: 06/30/2019 CLINICAL DATA:  Chest pain EXAM: CT ANGIOGRAPHY CHEST, ABDOMEN AND PELVIS TECHNIQUE: Non-contrast CT of the chest was initially obtained. Multidetector CT imaging through the chest, abdomen and pelvis was performed using the standard protocol during bolus administration of intravenous contrast. Multiplanar reconstructed images and MIPs were obtained and reviewed to evaluate the vascular anatomy. CONTRAST:  173mL OMNIPAQUE IOHEXOL 350 MG/ML SOLN COMPARISON:  None. FINDINGS: CTA CHEST FINDINGS Cardiovascular: There is no evidence for thoracic aortic dissection. Atherosclerotic changes are noted of the thoracic aorta without evidence for an aneurysm. The heart size is normal without evidence for a significant pericardial effusion. There is no pulmonary embolism. Mediastinum/Nodes: --No mediastinal or hilar lymphadenopathy. --No axillary lymphadenopathy. --No supraclavicular lymphadenopathy. --Normal thyroid gland. --The esophagus is unremarkable Lungs/Pleura: There are postsurgical changes bilaterally related to prior wedge resection. There are areas of scarring atelectasis bilaterally. There is hyperdense pleural base nodularity involving the right hemithorax. There is some atelectasis versus consolidation involving the right upper lobe. Architectural distortion is noted bilaterally resulting in volume loss. There is bilateral bronchial wall thickening with minimal mucous plugging. There is no pneumothorax or significant pleural effusion. Musculoskeletal: No chest wall abnormality. No acute or significant osseous findings. Review of the MIP images confirms the above findings. CTA ABDOMEN AND PELVIS FINDINGS VASCULAR Aorta: Normal caliber aorta without aneurysm, dissection, vasculitis or significant stenosis.  Celiac: Patent without evidence of aneurysm, dissection, vasculitis or significant stenosis. SMA: There is a replaced common hepatic artery arising from the SMA. Renals: Both renal arteries are patent without evidence of aneurysm, dissection, vasculitis, fibromuscular dysplasia or significant stenosis. IMA: Patent without evidence of aneurysm, dissection, vasculitis or significant stenosis. Inflow: Patent without evidence of aneurysm, dissection, vasculitis or significant stenosis. Veins: No obvious venous abnormality within the limitations of this arterial phase study. Review of the MIP images confirms the above findings. NON-VASCULAR Hepatobiliary: There is an indeterminate 1.6 cm hypoattenuating mass in hepatic segment 2/3 (axial series 7, image 127). There is a 2.2 cm hypoattenuating mass at the hepatic dome (axial series 7, image 101). Normal gallbladder.There is no biliary ductal dilation. Pancreas: Normal contours without ductal dilatation. No peripancreatic fluid collection. Spleen: Unremarkable. Adrenals/Urinary Tract: --Adrenal glands: Unremarkable. --  Right kidney/ureter: No hydronephrosis or radiopaque kidney stones. --Left kidney/ureter: No hydronephrosis or radiopaque kidney stones. --Urinary bladder: Unremarkable. Stomach/Bowel: --Stomach/Duodenum: No hiatal hernia or other gastric abnormality. Normal duodenal course and caliber. --Small bowel: Unremarkable. --Colon: There is underdistention of much of the patient's colon, specifically the cecum, ascending colon, and transverse colon. This limits evaluation at these levels. There is scattered colonic diverticula without CT evidence for diverticulitis. --Appendix: Normal. Lymphatic: --No retroperitoneal lymphadenopathy. --No mesenteric lymphadenopathy. --No pelvic or inguinal lymphadenopathy. Reproductive: Unremarkable Other: No ascites or free air. The abdominal wall is normal. Musculoskeletal. No acute displaced fractures. Review of the MIP images  confirms the above findings. IMPRESSION: 1. There is no evidence for aortic dissection. 2. Postsurgical changes of both lungs related to prior wedge resection there is some consolidation versus atelectasis in the right upper lobe as well as hyperdense pleural based nodules especially on the right. Comparison to outside prior studies or repeat CT chest in 3 months is recommended to confirm stability. 3. Indeterminate hepatic masses in the right left hepatic lobes. A dedicated follow-up outpatient contrast enhanced MRI is recommended for further evaluation. 4. Colonic diverticulosis without CT evidence for diverticulitis. Aortic Atherosclerosis (ICD10-I70.0). Electronically Signed   By: Katherine Mantle M.D.   On: 06/30/2019 00:28    EKG: Independently reviewed.  Sinus rhythm.  Very mild ST abnormality in inferior and lateral leads.  No prior tracing for comparison.  Assessment/Plan Principal Problem:   COPD with acute exacerbation (HCC) Active Problems:   Acute respiratory failure with hypoxemia (HCC)   Hypotension   Near syncope   Pleural nodule   Acute hypoxemic respiratory failure secondary to acute COPD exacerbation: Hypoxic to the 80s and initially required supplemental oxygen.  Work of breathing has improved after bronchodilator treatments and Solu-Medrol 125 mg.  Currently satting in the mid 90s on room air but does become tachypneic with respiratory rate up to 30s with minimal exertion such as sitting up in the bed.  Continues to have wheezing. -Solu-Medrol 60 mg every 6 hours, DuoNebs every 6 hours, albuterol nebulizer as needed, Pulmicort twice daily, and ceftriaxone given severity of illness.  Continuous pulse ox, supplemental oxygen if needed.  Hypotension/near syncope/ ?PNA: Hypotensive to the 70s on arrival.  No signs of acute blood loss- hemoglobin normal and not tachycardic on arrival.  Bedside ultrasound done by ED provider without evidence of pericardial effusion.  CT angiogram  chest negative for aortic dissection and no PE reported.  EKG with very mild ST abnormality in inferior and lateral leads, possibly repolarization abnormality.  ACS less likely as high-sensitivity troponin negative.  CT with question of some consolidation versus atelectasis.  However, pneumonia less likely given no fever or leukocytosis.  SARS-CoV-2 PCR test negative.  Possibly a vasovagal event. Blood pressure now improved after a 500 cc fluid bolus.  Currently normotensive. -Treat with ceftriaxone and azithromycin at this time.  Check procalcitonin level.  Second set of troponin pending.  Admit to progressive care unit and continue cardiac monitoring.    Pleural nodule: Hyperdense pleural-based nodule especially on the right reported on CT. Radiologist recommending comparison to outside prior studies or repeat CT chest in 3 months to confirm stability.   Hepatic masses: CT showing indeterminate hepatic masses in the right left hepatic lobes.  Radiologist recommending outpatient contrast enhanced MRI for further evaluation.  Tobacco use: NicoDerm patch and continue to counsel on smoking cessation.  DVT prophylaxis: Lovenox Code Status: Full code Family Communication: No family available at this  time. Disposition Plan: Status is: Inpatient  Remains inpatient appropriate because:Hemodynamically unstable, IV treatments appropriate due to intensity of illness or inability to take PO and Inpatient level of care appropriate due to severity of illness   Dispo: The patient is from: Home              Anticipated d/c is to: Home              Anticipated d/c date is: 3 days              Patient currently is not medically stable to d/c.  The medical decision making on this patient was of high complexity and the patient is at high risk for clinical deterioration, therefore this is a level 3 visit.  John Giovanni MD [facility]  If 7PM-7AM, please contact  night-coverage [email or web address]  06/30/2019, 2:12 AM

## 2019-06-30 NOTE — ED Notes (Signed)
O2 removed and back on room air. Sats 96%

## 2019-06-30 NOTE — Evaluation (Signed)
Physical Therapy Evaluation Patient Details Name: Glenn [name] MRN: [id number] DOB: November 14, 1960 Today's Date: 06/30/2019   History of Present Illness  Glenn [name] is a 59 y.o. male with medical history significant of COPD, hypertension, tobacco use presenting to the ED with complaint of shortness of breath and near syncope.  Patient states last night he was not doing much and became all of a sudden short of breath.  He was wheezing and started sweating.  He also started having some substernal chest/lower abdominal chest pain at that time.  Had a near syncopal event.  He has been coughing for the past few days.  Clinical Impression   Pt admitted with above diagnosis. 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 Pt will benefit from skilled PT to increase their independence and safety with mobility to allow discharge to the venue listed below.       Follow Up Recommendations Other (comment)(Consider Outpt Cardiopulm Rehab)    Equipment Recommendations  None recommended by PT    Recommendations for Other Services       Precautions / Restrictions Precautions Precautions: Other (comment) Precaution Comments: watch O2 sats`      Mobility  Bed Mobility Overal bed mobility: Independent                Transfers Overall transfer level: Independent                  Ambulation/Gait Ambulation/Gait assistance: Supervision;Independent Gait Distance (Feet): 200 Feet Assistive device: None Gait Pattern/deviations: WFL(Within Functional Limits) Gait velocity: approaching WNL    General Gait Details: Cues to sefl-monitor for activity tolerance; Walked on room air and O2 sats ranged from 88-95%; occasional cues for deep breathing  Stairs            Wheelchair Mobility    Modified Rankin (Stroke Patients Only)       Balance Overall balance assessment: No apparent balance deficits (not formally assessed)                                           Pertinent Vitals/Pain Pain Assessment: No/denies pain    Home Living Family/patient expects to be discharged to:: Private residence Living Arrangements: Spouse/significant other Available Help at Discharge: Family;Available PRN/intermittently Type of Home: Apartment Home Access: Stairs to enter Entrance Stairs-Rails: Right Entrance Stairs-Number of Steps: 12 Home Layout: One level Home Equipment: (Nebulizers)      Prior Function Level of Independence: Independent         Comments: Reports manages stairs to enter home, but is quite winded when he gets to the top     Hand Dominance        Extremity/Trunk Assessment   Upper Extremity Assessment Upper Extremity Assessment: Overall WFL for tasks assessed    Lower Extremity Assessment Lower Extremity Assessment: Overall WFL for tasks assessed    Cervical / Trunk Assessment Cervical / Trunk Assessment: Normal  Communication   Communication: No  difficulties  Cognition Arousal/Alertness: Awake/alert Behavior During Therapy: WFL for tasks assessed/performed Overall Cognitive Status: Within Functional Limits for tasks assessed                                        General Comments General comments (skin integrity, edema, etc.): VSS    Exercises     Assessment/Plan    PT Assessment Patient needs continued PT services  PT Problem List Decreased activity tolerance;Cardiopulmonary status limiting activity;Decreased knowledge of precautions       PT Treatment Interventions DME instruction;Gait  training;Stair training;Functional mobility training;Therapeutic activities;Therapeutic exercise;Patient/family education    PT Goals (Current goals can be found in the Care Plan section)  Acute Rehab PT Goals Patient Stated Goal: Hopes to be home soon PT Goal Formulation: With patient Time For Goal Achievement: 07/07/19 Potential to Achieve Goals: Good Additional Goals Additional Goal #1: Pt's O2 sats will remain greater than or equal to 92% during normal ambulation    Frequency Min 3X/week   Barriers to discharge        Co-evaluation               AM-PAC PT "6 Clicks" Mobility  Outcome Measure Help needed turning from your back to your side while in a flat bed without using bedrails?: None Help needed moving from lying on your back to sitting on the side of a flat bed without using bedrails?: None Help needed moving to and from a bed to a chair (including a wheelchair)?: None Help needed standing up from a chair using your arms (e.g., wheelchair or bedside chair)?: None Help needed to walk in hospital room?: None Help needed climbing 3-5 steps with a railing? : A Little 6 Click Score: 23    End of Session   Activity Tolerance: Patient tolerated treatment well Patient left: in bed;with call bell/phone within reach Nurse Communication: Mobility status PT Visit Diagnosis: Other abnormalities of gait and mobility (R26.89)    Time: 3557-3220 PT Time Calculation (min) (ACUTE ONLY): 26 min   Charges:   PT Evaluation $PT Eval Low Complexity: 1 Low PT Treatments $Gait Training: 8-22 mins        Van Clines, PT  Acute Rehabilitation Services Pager [phone] Office [phone]'  Levi Aland 06/30/2019, 10:16 AM

## 2019-06-30 NOTE — Progress Notes (Signed)
VASCULAR LAB PRELIMINARY  PRELIMINARY  PRELIMINARY  PRELIMINARY  Bilateral lower extremity venous duplex completed.    Preliminary report:  See CV proc for preliminary results.   KANADY, CANDACE, RVT 06/30/2019, 10:31 AM

## 2019-06-30 NOTE — Plan of Care (Signed)
  Problem: Education: Goal: Knowledge of General Education information will improve Description: Including pain rating scale, medication(s)/side effects and non-pharmacologic comfort measures Outcome: Progressing   Problem: Health Behavior/Discharge Planning: Goal: Ability to manage health-related needs will improve Outcome: Progressing   Problem: Clinical Measurements: Goal: Ability to maintain clinical measurements within normal limits will improve Outcome: Progressing   Problem: Clinical Measurements: Goal: Cardiovascular complication will be avoided Outcome: Progressing   Problem: Activity: Goal: Risk for activity intolerance will decrease Outcome: Progressing   Problem: Safety: Goal: Ability to remain free from injury will improve Outcome: Progressing

## 2019-06-30 NOTE — Progress Notes (Signed)
OT Cancellation Note  Patient Details Name: Glenn [name] MRN: [id number] DOB: 05-18-1960   Cancelled Treatment:    Reason Eval/Treat Not Completed: OT screened, no needs identified, will sign off; pt mobilizing without difficulty with PT today and without notable DOE, having no difficulty with ADL tasks at this time. PT discussing energy conservation with pt. Acute OT to sign off at this time, please re-consult should pt's needs change. Thank you for this referral.    Marcy Siren, OT Acute Rehabilitation Services Pager [phone] Office [phone]  Orlando Penner 06/30/2019, 8:55 AM

## 2019-07-01 ENCOUNTER — Inpatient Hospital Stay (HOSPITAL_COMMUNITY): Payer: Medicaid Other

## 2019-07-01 DIAGNOSIS — R222 Localized swelling, mass and lump, trunk: Secondary | ICD-10-CM

## 2019-07-01 DIAGNOSIS — I959 Hypotension, unspecified: Secondary | ICD-10-CM

## 2019-07-01 LAB — BASIC METABOLIC PANEL
Anion gap: 9 (ref 5–15)
BUN: 15 mg/dL (ref 6–20)
CO2: 22 mmol/L (ref 22–32)
Calcium: 8.8 mg/dL — ABNORMAL LOW (ref 8.9–10.3)
Chloride: 108 mmol/L (ref 98–111)
Creatinine, Ser: 1.03 mg/dL (ref 0.61–1.24)
GFR calc Af Amer: >60 mL/min (ref >60)
GFR calc non Af Amer: >60 mL/min (ref >60)
Glucose, Bld: 218 mg/dL — ABNORMAL HIGH (ref 70–99)
Potassium: 4.4 mmol/L (ref 3.5–5.1)
Sodium: 139 mmol/L (ref 135–145)

## 2019-07-01 LAB — CBC
HCT: 40.7 % (ref 39.0–52.0)
Hemoglobin: 12.8 g/dL — ABNORMAL LOW (ref 13.0–17.0)
MCH: 29.3 pg (ref 26.0–34.0)
MCHC: 31.4 g/dL (ref 30.0–36.0)
MCV: 93.1 fL (ref 80.0–100.0)
Platelets: 260 10*3/uL (ref 150–400)
RBC: 4.37 MIL/uL (ref 4.22–5.81)
RDW: 13.5 % (ref 11.5–15.5)
WBC: 15.4 10*3/uL — ABNORMAL HIGH (ref 4.0–10.5)
nRBC: 0 % (ref 0.0–0.2)

## 2019-07-01 MED ORDER — LORAZEPAM 2 MG/ML IJ SOLN
1.0000 mg | Freq: Once | INTRAMUSCULAR | Status: AC
  Administered 2019-07-01: 1 mg via INTRAVENOUS
  Filled 2019-07-01: qty 1

## 2019-07-01 MED ORDER — GADOBUTROL 1 MMOL/ML IV SOLN
6.0000 mL | Freq: Once | INTRAVENOUS | Status: AC | PRN
  Administered 2019-07-01: 6 mL via INTRAVENOUS

## 2019-07-01 MED ORDER — PREDNISONE 10 MG (21) PO TBPK
ORAL_TABLET | ORAL | 0 refills | Status: DC
Start: 2019-07-01 — End: 2020-01-15

## 2019-07-01 MED ORDER — PANTOPRAZOLE SODIUM 40 MG PO TBEC: 40.0000 mg | DELAYED_RELEASE_TABLET | Freq: Every day | ORAL | 0 refills | Status: AC

## 2019-07-01 NOTE — Progress Notes (Signed)
Patient discharged: Home with family  Via: Wheelchair   Discharge paperwork given: to patient and family  Reviewed with teach back  IV and telemetry disconnected  Belongings given to patient    

## 2019-07-01 NOTE — TOC Progression Note (Signed)
Transition of Care [facility]) - Progression Note    Patient Details  Name: Glenn [name] MRN: [id number] Date of Birth: 09-08-1960  Transition [facility]) CM/SW Contact  Leone Haven, RN Phone Number: 07/01/2019, 9:20 AM  Clinical Narrative:    NCM spoke with patient, he states he will need transportation at discharge, NCM will assist patient with this.  PT eval rec cardiopulmonary rehab for patient as outpatient.  NCM notified MD to put in consult for this.           Expected Discharge Plan and Services                                                 Social Determinants of Health (SDOH) Interventions    Readmission Risk Interventions No flowsheet data found.

## 2019-07-01 NOTE — Progress Notes (Signed)
Physical Therapy Treatment Patient Details Name: Glenn [name] MRN: [id number] DOB: [birth year] Today's Date: 07/01/2019    History of Present Illness Glenn [name] is a 59 y.o. male with medical history significant of COPD, hypertension, tobacco use presenting to the ED with complaint of shortness of breath and near syncope.  Patient states last night he was not doing much and became all of a sudden short of breath.  He was wheezing and started sweating.  He also started having some substernal chest/lower abdominal chest pain at that time.  Had a near syncopal event.  He has been coughing for the past few days.    PT Comments    Pt in bed upon arrival of PT, agreeable to session with focus on progression of ambulation endurance and stair training prior to anticipated d/c home. The pt was able to demo good stability and tolerance for ambulation (VSS on RA with SpO2 generally 92-97%) without AD. The pt was also able to complete navigation of 10 steps with some use of rail and alternating step pattern with supervision only for safety. The pt's SpO2 did drop to 85% on RA following stairs, returned to 90 with standing rest and guided breathing. The pt was educated in energy conservation and use of pulse ox as tool he can use as well. The pt verbalized understanding and agreement with all education. The pt will continue to benefit from skilled PT to further progress endurance and activity tolerance, but is safe to return home with continued activity and cardiac rehab on an outpatient basis in the future.     Follow Up Recommendations  Other (comment)(OP cardiac rehab)     Equipment Recommendations  None recommended by PT    Recommendations for Other Services       Precautions / Restrictions Precautions Precautions: Other (comment) Precaution Comments: watch O2 sats` Restrictions Weight Bearing Restrictions: No    Mobility  Bed Mobility Overal bed mobility: Independent                 Transfers Overall transfer level: Independent                  Ambulation/Gait Ambulation/Gait assistance: Independent Gait Distance (Feet): 150 Feet Assistive device: None Gait Pattern/deviations: WFL(Within Functional Limits) Gait velocity: WNL Gait velocity interpretation: >2.62 ft/sec, indicative of community ambulatory General Gait Details: continued cues to self-monitor for activity tolerance; Walked on room air and O2 sats ranged from 88-95%; occasional cues for deep breathing   Stairs Stairs: Yes Stairs assistance: Supervision Stair Management: No rails;One rail Right;Alternating pattern;Forwards Number of Stairs: 10 General stair comments: pt able to navigate 10 stairs with alternating pattern, and only intermittant use of rail. SpO2 84% following stairs, guided through breathing exercise to return to 88-90%   Wheelchair Mobility    Modified Rankin (Stroke Patients Only)       Balance Overall balance assessment: No apparent balance deficits (not formally assessed)                                          Cognition Arousal/Alertness: Awake/alert Behavior During Therapy: WFL for tasks assessed/performed Overall Cognitive Status: Within Functional Limits for tasks assessed  Exercises      General Comments General comments (skin integrity, edema, etc.): VSS, SpO2 range from 88-97% generally on RA, low of 85% following stairs, but able to recover to 90s with standing rest.      Pertinent Vitals/Pain Pain Assessment: No/denies pain    Home Living                      Prior Function            PT Goals (current goals can now be found in the care plan section) Acute Rehab PT Goals Patient Stated Goal: Hopes to be home soon PT Goal Formulation: With patient Time For Goal Achievement: 07/07/19 Potential to Achieve Goals: Good Additional Goals Additional Goal #1:  Pt's O2 sats will remain greater than or equal to 92% during normal ambulation Progress towards PT goals: Progressing toward goals    Frequency    Min 3X/week      PT Plan Current plan remains appropriate    Co-evaluation              AM-PAC PT "6 Clicks" Mobility   Outcome Measure  Help needed turning from your back to your side while in a flat bed without using bedrails?: None Help needed moving from lying on your back to sitting on the side of a flat bed without using bedrails?: None Help needed moving to and from a bed to a chair (including a wheelchair)?: None Help needed standing up from a chair using your arms (e.g., wheelchair or bedside chair)?: None Help needed to walk in hospital room?: None Help needed climbing 3-5 steps with a railing? : A Little 6 Click Score: 23    End of Session Equipment Utilized During Treatment: Gait belt Activity Tolerance: Patient tolerated treatment well Patient left: in bed;with call bell/phone within reach Nurse Communication: Mobility status PT Visit Diagnosis: Other abnormalities of gait and mobility (R26.89)     Time: 7517-0017 PT Time Calculation (min) (ACUTE ONLY): 16 min  Charges:  $Gait Training: 8-22 mins                     Karma Ganja, PT, DPT   Acute Rehabilitation Department Pager #: [phone]   Otho Bellows 07/01/2019, 1:25 PM

## 2019-07-01 NOTE — Discharge Instructions (Signed)
Follow with Primary MD Avbuere, Edwin, MD in 7 days   Get CBC, CMP,  checked  by Primary MD next visit.    Activity: As tolerated with Full fall precautions use walker/cane & assistance as needed   Disposition Home    Diet: Heart Healthy  , with feeding assistance and aspiration precautions.  For Heart failure patients - Check your Weight same time everyday, if you gain over 2 pounds, or you develop in leg swelling, experience more shortness of breath or chest pain, call your Primary MD immediately. Follow Cardiac Low Salt Diet and 1.5 lit/day fluid restriction.   On your next visit with your primary care physician please Get Medicines reviewed and adjusted.   Please request your Prim.MD to go over all Hospital Tests and Procedure/Radiological results at the follow up, please get all Hospital records sent to your Prim MD by signing hospital release before you go home.   If you experience worsening of your admission symptoms, develop shortness of breath, life threatening emergency, suicidal or homicidal thoughts you must seek medical attention immediately by calling 911 or calling your MD immediately  if symptoms less severe.  You Must read complete instructions/literature along with all the possible adverse reactions/side effects for all the Medicines you take and that have been prescribed to you. Take any new Medicines after you have completely understood and accpet all the possible adverse reactions/side effects.   Do not drive, operating heavy machinery, perform activities at heights, swimming or participation in water activities or provide baby sitting services if your were admitted for syncope or siezures until you have seen by Primary MD or a Neurologist and advised to do so again.  Do not drive when taking Pain medications.    Do not take more than prescribed Pain, Sleep and Anxiety Medications  Special Instructions: If you have smoked or chewed Tobacco  in the last 2 yrs  please stop smoking, stop any regular Alcohol  and or any Recreational drug use.  Wear Seat belts while driving.   Please note  You were cared for by a hospitalist during your hospital stay. If you have any questions about your discharge medications or the care you received while you were in the hospital after you are discharged, you can call the unit and asked to speak with the hospitalist on call if the hospitalist that took care of you is not available. Once you are discharged, your primary care physician will handle any further medical issues. Please note that NO REFILLS for any discharge medications will be authorized once you are discharged, as it is imperative that you return to your primary care physician (or establish a relationship with a primary care physician if you do not have one) for your aftercare needs so that they can reassess your need for medications and monitor your lab values.  

## 2019-07-01 NOTE — Progress Notes (Signed)
Nutrition Brief Note  RD consulted for assessment of nutrition related needs/ status secondary to COPD.   Wt Readings from Last 15 Encounters:  07/01/19 63 kg  03/26/19 65.3 kg  11/20/18 65.7 kg  08/14/18 65.3 kg  02/08/18 65 kg  12/12/17 62.6 kg  11/22/17 64.4 kg  10/05/16 59.8 kg   Glenn [name] is a 59 y.o. male with medical history significant of COPD, hypertension, tobacco use presenting to the ED with complaint of shortness of breath and near syncope  Pt admitted with COPD exacerbation.   Reviewed I/O's: -651 ml x 24 hours and -51 ml since admission  UOP: 1.2 L x 24 hours  Spoke with pt at bedside, who reports feeling better today. Pt shares that his meal intake pattern has been variable over the past 2 weeks, as he travelled to [facility] to visit with family. He typically consumes 2 meals per day (Breakfast: cereal and coffee; Dinner: meat, starch, and vegetables that he prepares himself). 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   Pt denies any weight loss. He reports his UBW is around 140#.   Nutrition-Focused physical exam completed. Findings are no fat depletion, no muscle depletion, and no edema.   Medications reviewed and include lovenox and IV solu-medrol.   Labs reviewed.   Current diet order is heart healthy/ carb modified, patient is consuming approximately 50-100%% of meals at this time. Labs and medications reviewed.   No nutrition interventions warranted at this time. If nutrition issues arise, please consult RD.   Levada Schilling, RD, LDN, CDCES Registered Dietitian II Certified Diabetes Care and Education Specialist Please refer to [facility] for RD and/or RD on-call/weekend/after hours pager

## 2019-07-01 NOTE — Discharge Summary (Addendum)
Ike Maragh, is a 59 y.o. male  DOB January 17, 1961  MRN [id number].  Admission date:  06/29/2019  Admitting Physician  John Giovanni, MD  Discharge Date:  07/01/2019   Primary MD  Fleet Contras, MD  Recommendations for primary care physician for things to follow:  -Patient will need repeat CT chest in 3 months, please see discussion below. -Please check CBC, BMP during next visit -Referral has been made for cardiopulmonary rehab.   Admission Diagnosis  SOB (shortness of breath) [R06.02] COPD exacerbation (HCC) [J44.1] Acute respiratory failure with hypoxia (HCC) [J96.01] Near syncope [R55] COPD with acute exacerbation (HCC) [J44.1] Hypotension, unspecified hypotension type [I95.9]   Discharge Diagnosis  SOB (shortness of breath) [R06.02] COPD exacerbation (HCC) [J44.1] Acute respiratory failure with hypoxia (HCC) [J96.01] Near syncope [R55] COPD with acute exacerbation (HCC) [J44.1] Hypotension, unspecified hypotension type [I95.9]    Principal Problem:   COPD with acute exacerbation (HCC) Active Problems:   Acute respiratory failure with hypoxemia (HCC)   Hypotension   Near syncope   Pleural nodule      Past Medical History:  Diagnosis Date  . Arthritis   . Asthma   . COPD (chronic obstructive pulmonary disease) (HCC)   . ED (erectile dysfunction)   . Hypertension     Past Surgical History:  Procedure Laterality Date  . LUNG SURGERY     removed tissue       History of present illness and  Hospital Course:     Kindly see H&P for history of present illness and admission details, please review complete Labs, Consult reports and Test reports for all details in brief  HPI  from the history and physical done on the day of admission 06/30/2019  HPI: Halim Surrette is a 59 y.o. male with medical history significant of COPD, hypertension, tobacco use presenting to the ED with  complaint of shortness of breath and near syncope.  Patient states last night he was not doing much and became all of a sudden short of breath.  He was wheezing and started sweating.  He also started having some substernal chest/lower abdominal chest pain at that time.  Had a near syncopal event.  He has been coughing for the past few days.  Denies any fevers, nausea, vomiting, or diarrhea.  States his chest/abdominal pain have now resolved and breathing has improved since he has been in the emergency room.  He smokes cigarettes and a pack lasts him about a week.  Reports compliance with his home inhalers.  ED Course: Upon arrival, hypotensive, diaphoretic, and wheezing, increased work of breathing.  Hypoxic to the 80s.  Bedside ultrasound done by ED provider without evidence of pericardial effusion.  Afebrile.  Labs showing no leukocytosis.  Lipase and LFTs normal.  SARS-CoV-2 PCR test negative.  EKG with very mild ST abnormality in inferior and lateral leads.  Initial high-sensitivity troponin negative, repeat pending.  CT angiogram chest/abdomen/pelvis negative for aortic dissection.  Showing postsurgical changes of both lungs related to prior wedge resection with some  consolidation versus atelectasis in the upper lobe as well as hyperdense pleural-based nodule especially on the right.  Radiologist recommending comparison to outside prior studies or repeat CT chest in 3 months to confirm stability.  Also showing indeterminate hepatic masses in the right left hepatic lobes.  Outpatient contrast enhanced MRI is recommended for further evaluation.  Patient received bronchodilator treatments, IV Solu-Medrol 125 mg, and 500 cc fluid bolus.  Hospital Course    Acute hypoxic respiratory failure secondary to acute COPD exacerbation: - Hypoxic to the 80s and initially required supplemental oxygen. Work of breathing has improved after bronchodilator treatments and Solu-Medrol 125 mg. Initially with increased  work of breathing, quite dyspneic, she was treated with IV steroids and nebulizer treatment, as well IV antibiotics, and he was kept on his home Pulmicort and nebulizer treatment, he has significantly improved this morning, wheezing significantly subsided, no dyspnea, so will be discharged today on a prednisone taper, and to continue with his home medications.  Hypotension/near syncope/ ?PNA: - Hypotensive to the 70s on arrival. No signs of acute blood loss- hemoglobin normal and not tachycardic on arrival.Bedside ultrasound done by ED provider without evidence of pericardial effusion. CT angiogram chest negative for aortic dissection and no PE reported. EKG with very mild ST abnormality in inferior and lateral leads, possibly repolarization abnormality. ACS unlikely as high-sensitivity troponin negative. CT with question of some consolidation versus atelectasis. However, pneumonia less likely given no fever or leukocytosis. SARS-CoV-2 PCR test negative. Possibly a vasovagalevent. Blood pressure now improved after a 500 cc fluid bolus. Currently normotensive. -Patient with normal D-dimers, no evidence of DVT  Pleural nodule:  -Hyperdense pleural-based nodule especially on the right reported on CT. Radiologist recommending comparison to outside prior studies or repeat CT chest in 3 months to confirm stability.  This was discussed with the patient, and he does understand the importance of the follow-up  Hepatic masses/benign hemangioma lesions: CT showing indeterminate hepatic masses in the right left hepatic lobes.  MRI was obtained, it does show a benign hemangioma.  Tobacco use:Was counseled   Discharge Condition:  stable   Follow UP  Follow-up Information    Fleet Contras, MD.   Specialty: Internal Medicine Contact information: [address] [place] [address] [phone]             Discharge Instructions  and  Discharge Medications    Discharge  Instructions    Amb Referral to Cardiac Rehabilitation   Complete by: As directed    COPD, in need of cardiopulmonary rehab   Diagnosis: Other   After initial evaluation and assessments completed: Virtual Based Care may be provided alone or in conjunction with Phase 2 Cardiac Rehab based on patient barriers.: Yes   Discharge instructions   Complete by: As directed    Follow with Primary MD Fleet Contras, MD in 7 days   Get CBC, CMP, checked  by Primary MD next visit.    Activity: As tolerated with Full fall precautions use walker/cane & assistance as needed   Disposition Home    Diet: Heart Healthy  , with feeding assistance and aspiration precautions.  For Heart failure patients - Check your Weight same time everyday, if you gain over 2 pounds, or you develop in leg swelling, experience more shortness of breath or chest pain, call your Primary MD immediately. Follow Cardiac Low Salt Diet and 1.5 lit/day fluid restriction.   On your next visit with your primary care physician please Get Medicines reviewed and adjusted.  Please request your Prim.MD to go over all Hospital Tests and Procedure/Radiological results at the follow up, please get all Hospital records sent to your Prim MD by signing hospital release before you go home.   If you experience worsening of your admission symptoms, develop shortness of breath, life threatening emergency, suicidal or homicidal thoughts you must seek medical attention immediately by calling 911 or calling your MD immediately  if symptoms less severe.  You Must read complete instructions/literature along with all the possible adverse reactions/side effects for all the Medicines you take and that have been prescribed to you. Take any new Medicines after you have completely understood and accpet all the possible adverse reactions/side effects.   Do not drive, operating heavy machinery, perform activities at heights, swimming or participation in  water activities or provide baby sitting services if your were admitted for syncope or siezures until you have seen by Primary MD or a Neurologist and advised to do so again.  Do not drive when taking Pain medications.    Do not take more than prescribed Pain, Sleep and Anxiety Medications  Special Instructions: If you have smoked or chewed Tobacco  in the last 2 yrs please stop smoking, stop any regular Alcohol  and or any Recreational drug use.  Wear Seat belts while driving.   Please note  You were cared for by a hospitalist during your hospital stay. If you have any questions about your discharge medications or the care you received while you were in the hospital after you are discharged, you can call the unit and asked to speak with the hospitalist on call if the hospitalist that took care of you is not available. Once you are discharged, your primary care physician will handle any further medical issues. Please note that NO REFILLS for any discharge medications will be authorized once you are discharged, as it is imperative that you return to your primary care physician (or establish a relationship with a primary care physician if you do not have one) for your aftercare needs so that they can reassess your need for medications and monitor your lab values.   Increase activity slowly   Complete by: As directed    Pulmonary rehab therapeutic exercise   Complete by: As directed      Allergies as of 07/01/2019      Reactions   Ibuprofen    Abdominal sensitivity   Shrimp [shellfish Allergy] Itching   Tylenol [acetaminophen]    Abdominal sensitvity   Lisinopril Rash   Tongue swelling      Medication List    STOP taking these medications   benzonatate 100 MG capsule Commonly known as: TESSALON   predniSONE 10 MG tablet Commonly known as: DELTASONE Replaced by: predniSONE 10 MG (21) Tbpk tablet     TAKE these medications   albuterol (2.5 MG/3ML) 0.083% nebulizer  solution Commonly known as: PROVENTIL PLEASE SEE ATTACHED FOR DETAILED DIRECTIONS What changed: See the new instructions.   albuterol 108 (90 Base) MCG/ACT inhaler Commonly known as: Proventil HFA Inhale 2 puffs into the lungs every 4 (four) hours as needed for wheezing or shortness of breath. What changed: Another medication with the same name was changed. Make sure you understand how and when to take each.   amLODipine 10 MG tablet Commonly known as: NORVASC Take 10 mg by mouth daily.   Aspirin Low Dose 81 MG EC tablet Generic drug: aspirin Take 81 mg by mouth daily.   budesonide-formoterol 160-4.5 MCG/ACT inhaler  Commonly known as: Symbicort Inhale 2 puffs into the lungs 2 (two) times daily.   Dulera 200-5 MCG/ACT Aero Generic drug: mometasone-formoterol Inhale 2 puffs into the lungs 2 (two) times daily.   gabapentin 300 MG capsule Commonly known as: Neurontin Take 1 capsule (300 mg total) by mouth 3 (three) times daily.   nitroGLYCERIN 0.4 MG SL tablet Commonly known as: NITROSTAT Place 0.4 mg under the tongue every 5 (five) minutes as needed for chest pain.   oxycodone 30 MG immediate release tablet Commonly known as: ROXICODONE Take 30 mg by mouth every 6 (six) hours as needed (chronic pain).   pantoprazole 40 MG tablet Commonly known as: PROTONIX Take 1 tablet (40 mg total) by mouth daily. Start taking on: Jul 02, 2019   predniSONE 10 MG (21) Tbpk tablet Commonly known as: STERAPRED UNI-PAK 21 TAB Use per package instructions Replaces: predniSONE 10 MG tablet   sildenafil 100 MG tablet Commonly known as: VIAGRA Take 100 mg by mouth daily as needed for erectile dysfunction.   tamsulosin 0.4 MG Caps capsule Commonly known as: FLOMAX Take 0.4 mg by mouth every evening.   Vitamin D (Ergocalciferol) 1.25 MG (50000 UNIT) Caps capsule Commonly known as: DRISDOL Take 50,000 Units by mouth once a week. Mondays         Diet and Activity recommendation:  See Discharge Instructions above   Consults obtained -  None   Major procedures and Radiology Reports - PLEASE review detailed and final reports for all details, in brief -      MR ABDOMEN W WO CONTRAST  Result Date: 07/01/2019 CLINICAL DATA:  Indeterminate liver lesion identified on CT. MRI recommended for further evaluation. EXAM: MRI ABDOMEN WITHOUT AND WITH CONTRAST TECHNIQUE: Multiplanar multisequence MR imaging of the abdomen was performed both before and after the administration of intravenous contrast. CONTRAST:  6mL GADAVIST GADOBUTROL 1 MMOL/ML IV SOLN COMPARISON:  None. FINDINGS: Lower chest:  Lung bases are clear. Hepatobiliary: Several well-circumscribed lesions in liver are hyperintense on T2 weighted imaging (series 5). Example 17 mm lesion the posterior RIGHT hepatic lobe (image 6). 20 mm lesion in the posterior LEFT hepatic lobe (image 12). These lesions demonstrate postcontrast peripheral enhancement pattern typical of benign hemangioma (series 22 and series 20). No biliary duct dilatation.  Normal gallbladder. Pancreas: Normal pancreatic parenchymal intensity. No ductal dilatation or inflammation. Spleen: Normal spleen. Adrenals/urinary tract: Adrenal glands and kidneys are normal. Stomach/Bowel: Stomach and limited of the small bowel is unremarkable Vascular/Lymphatic: Abdominal aortic normal caliber. No retroperitoneal periportal lymphadenopathy. Musculoskeletal: No aggressive osseous lesion IMPRESSION: 1. Benign hemangioma within the liver correspond to indeterminate lesions on CT. 2. Normal liver and biliary tree. 3. Normal pancreas. Electronically Signed   By: Genevive BiStewart  Edmunds M.D.   On: 07/01/2019 15:54   DG Chest Port 1 View  Result Date: 06/29/2019 CLINICAL DATA:  Shortness of breath EXAM: PORTABLE CHEST 1 VIEW COMPARISON:  March 06, 2019 FINDINGS: There is elevation of the left hemidiaphragm. Bibasilar airspace opacities are again noted and favored to represent areas  of atelectasis and scarring. The patient is likely status post prior wedge resection in the left upper lobe. Surgical staples are noted along the patient's left mediastinum. There is no pneumothorax. No focal infiltrate. There are emphysematous changes bilaterally. The patient appears to be status post prior wedge resection in the right upper lung zone. IMPRESSION: No acute cardiopulmonary process. Chronic findings as detailed above. Electronically Signed   By: Katherine Mantlehristopher  Green M.D.   On:  06/29/2019 23:17   CT Angio Chest/Abd/Pel for Dissection W and/or Wo Contrast  Result Date: 06/30/2019 CLINICAL DATA:  Chest pain EXAM: CT ANGIOGRAPHY CHEST, ABDOMEN AND PELVIS TECHNIQUE: Non-contrast CT of the chest was initially obtained. Multidetector CT imaging through the chest, abdomen and pelvis was performed using the standard protocol during bolus administration of intravenous contrast. Multiplanar reconstructed images and MIPs were obtained and reviewed to evaluate the vascular anatomy. CONTRAST:  OMNIPAQUE IOHEXOL 350 MG/ML SOLN COMPARISON:  None. FINDINGS: CTA CHEST FINDINGS Cardiovascular: There is no evidence for thoracic aortic dissection. Atherosclerotic changes are noted of the thoracic aorta without evidence for an aneurysm. The heart size is normal without evidence for a significant pericardial effusion. There is no pulmonary embolism. Mediastinum/Nodes: --No mediastinal or hilar lymphadenopathy. --No axillary lymphadenopathy. --No supraclavicular lymphadenopathy. --Normal thyroid gland. --The esophagus is unremarkable Lungs/Pleura: There are postsurgical changes bilaterally related to prior wedge resection. There are areas of scarring atelectasis bilaterally. There is hyperdense pleural base nodularity involving the right hemithorax. There is some atelectasis versus consolidation involving the right upper lobe. Architectural distortion is noted bilaterally resulting in volume loss. There is bilateral  bronchial wall thickening with minimal mucous plugging. There is no pneumothorax or significant pleural effusion. Musculoskeletal: No chest wall abnormality. No acute or significant osseous findings. Review of the MIP images confirms the above findings. CTA ABDOMEN AND PELVIS FINDINGS VASCULAR Aorta: Normal caliber aorta without aneurysm, dissection, vasculitis or significant stenosis. Celiac: Patent without evidence of aneurysm, dissection, vasculitis or significant stenosis. SMA: There is a replaced common hepatic artery arising from the SMA. Renals: Both renal arteries are patent without evidence of aneurysm, dissection, vasculitis, fibromuscular dysplasia or significant stenosis. IMA: Patent without evidence of aneurysm, dissection, vasculitis or significant stenosis. Inflow: Patent without evidence of aneurysm, dissection, vasculitis or significant stenosis. Veins: No obvious venous abnormality within the limitations of this arterial phase study. Review of the MIP images confirms the above findings. NON-VASCULAR Hepatobiliary: There is an indeterminate 1.6 cm hypoattenuating mass in hepatic segment 2/3 (axial series 7, image 127). There is a 2.2 cm hypoattenuating mass at the hepatic dome (axial series 7, image 101). Normal gallbladder.There is no biliary ductal dilation. Pancreas: Normal contours without ductal dilatation. No peripancreatic fluid collection. Spleen: Unremarkable. Adrenals/Urinary Tract: --Adrenal glands: Unremarkable. --Right kidney/ureter: No hydronephrosis or radiopaque kidney stones. --Left kidney/ureter: No hydronephrosis or radiopaque kidney stones. --Urinary bladder: Unremarkable. Stomach/Bowel: --Stomach/Duodenum: No hiatal hernia or other gastric abnormality. Normal duodenal course and caliber. --Small bowel: Unremarkable. --Colon: There is underdistention of much of the patient's colon, specifically the cecum, ascending colon, and transverse colon. This limits evaluation at these  levels. There is scattered colonic diverticula without CT evidence for diverticulitis. --Appendix: Normal. Lymphatic: --No retroperitoneal lymphadenopathy. --No mesenteric lymphadenopathy. --No pelvic or inguinal lymphadenopathy. Reproductive: Unremarkable Other: No ascites or free air. The abdominal wall is normal. Musculoskeletal. No acute displaced fractures. Review of the MIP images confirms the above findings. IMPRESSION: 1. There is no evidence for aortic dissection. 2. Postsurgical changes of both lungs related to prior wedge resection there is some consolidation versus atelectasis in the right upper lobe as well as hyperdense pleural based nodules especially on the right. Comparison to outside prior studies or repeat CT chest in 3 months is recommended to confirm stability. 3. Indeterminate hepatic masses in the right left hepatic lobes. A dedicated follow-up outpatient contrast enhanced MRI is recommended for further evaluation. 4. Colonic diverticulosis without CT evidence for diverticulitis. Aortic Atherosclerosis (ICD10-I70.0). Electronically Signed  By: Constance Holster M.D.   On: 06/30/2019 00:28   VAS [place] LOWER EXTREMITY VENOUS (DVT)  Result Date: 07/01/2019  Lower Venous DVTStudy Indications: Syncope, chest pain, hypotension after multiple [place]aley car rides.  Limitations: Inapporpriate patient behavior/reaction to examination. Comparison Study: No prior study on file for comparison Performing Technologist: Sharion Dove RVS  Examination Guidelines: A complete evaluation includes B-mode imaging, spectral Doppler, color Doppler, and power Doppler as needed of all accessible portions of each vessel. Bilateral testing is considered an integral part of a complete examination. Limited examinations for reoccurring indications may be performed as noted. The reflux portion of the exam is performed with the patient in reverse Trendelenburg.   +---------+---------------+---------+-----------+----------+--------------+ RIGHT    CompressibilityPhasicitySpontaneityPropertiesThrombus Aging +---------+---------------+---------+-----------+----------+--------------+ CFV      Full           Yes      Yes                                 +---------+---------------+---------+-----------+----------+--------------+ SFJ      Full                                                        +---------+---------------+---------+-----------+----------+--------------+ FV Prox  Full                                                        +---------+---------------+---------+-----------+----------+--------------+ FV Mid   Full                                                        +---------+---------------+---------+-----------+----------+--------------+ FV DistalFull                                                        +---------+---------------+---------+-----------+----------+--------------+ PFV      Full                                                        +---------+---------------+---------+-----------+----------+--------------+ POP      Full           Yes      Yes                                 +---------+---------------+---------+-----------+----------+--------------+ PTV      Full                                                        +---------+---------------+---------+-----------+----------+--------------+  PERO     Full                                                        +---------+---------------+---------+-----------+----------+--------------+   +---------+---------------+---------+-----------+----------+--------------+ LEFT     CompressibilityPhasicitySpontaneityPropertiesThrombus Aging +---------+---------------+---------+-----------+----------+--------------+ CFV      Full           Yes      Yes                                  +---------+---------------+---------+-----------+----------+--------------+ SFJ      Full                                                        +---------+---------------+---------+-----------+----------+--------------+ FV Prox  Full                                                        +---------+---------------+---------+-----------+----------+--------------+ FV Mid   Full                                                        +---------+---------------+---------+-----------+----------+--------------+ FV DistalFull                                                        +---------+---------------+---------+-----------+----------+--------------+ PFV      Full                                                        +---------+---------------+---------+-----------+----------+--------------+ POP      Full           Yes      Yes                                 +---------+---------------+---------+-----------+----------+--------------+ PTV      Full                                                        +---------+---------------+---------+-----------+----------+--------------+ PERO     Full                                                        +---------+---------------+---------+-----------+----------+--------------+  Summary: BILATERAL: - No evidence of deep vein thrombosis seen in the lower extremities, bilaterally. -   *See table(s) above for measurements and observations. Electronically signed by Lemar Livings MD on 07/01/2019 at 12:52:27 AM.    Final     Micro Results     Recent Results (from the past 240 hour(s))  SARS Coronavirus 2 by RT PCR (hospital order, performed in [facility] hospital lab) Nasopharyngeal Nasopharyngeal Swab     Status: None   Collection Time: 06/29/19 11:08 PM   Specimen: Nasopharyngeal Swab  Result Value Ref Range Status   SARS Coronavirus 2 NEGATIVE NEGATIVE Final    Comment: (NOTE) SARS-CoV-2 target nucleic acids  are NOT DETECTED. The SARS-CoV-2 RNA is generally detectable in upper and lower respiratory specimens during the acute phase of infection. The lowest concentration of SARS-CoV-2 viral copies this assay can detect is 250 copies / mL. A negative result does not preclude SARS-CoV-2 infection and should not be used as the sole basis for treatment or other patient management decisions.  A negative result may occur with improper specimen collection / handling, submission of specimen other than nasopharyngeal swab, presence of viral mutation(s) within the areas targeted by this assay, and inadequate number of viral copies (<250 copies / mL). A negative result must be combined with clinical observations, patient history, and epidemiological information. Fact Sheet for Patients:   BoilerBrush[employer]com[employer]cy Fact Sheet for Healthcare Providers: https://pope.com/ This test is not yet approved or cleared  by the [place] FDA and has been authorized for detection and/or diagnosis of SARS-CoV-2 by FDA under an Emergency Use Authorization (EUA).  This EUA will remain in effect (meaning this test can be used) for the duration of the COVID-19 declaration under Section 564(b)(1) of the Act, 21 U.S.C. section 360bbb-3(b)(1), unless the authorization is terminated or revoked sooner. Performed at [facility], [address], [place], [place] [address]        Today   Subjective:   Zyen Triggs today has no headache,no chest or abdominal pain,no new weakness tingling or numbness, feels much better wants to go home today.  Denies any dyspnea today.  Objective:   Blood pressure 128/87, pulse (!) 109, temperature 98.2 F (36.8 C), temperature source Oral, resp. rate 17, height 5\' 5"  (1.651 m), weight 63 kg, SpO2 97 %.   Intake/Output Summary (Last 24 hours) at 07/01/2019 1635 Last data filed at 07/01/2019 1602 Gross per 24 hour  Intake 1044 ml    Output 1665 ml  Net -621 ml    Exam Awake Alert, Oriented x 3, No new F.N deficits, Normal affect Symmetrical Chest wall movement, proved air entry bilaterally, no tachypnea, wheezing still present, but significantly improved. RRR,No Gallops,Rubs or new Murmurs, No Parasternal Heave +ve B.Sounds, Abd Soft, Non tender, No organomegaly appriciated, No rebound -guarding or rigidity. No Cyanosis, Clubbing or edema, No new Rash or bruise  Data Review   CBC w Diff:  Lab Results  Component Value Date   WBC 15.4 (H) 07/01/2019   HGB 12.8 (L) 07/01/2019   HCT 40.7 07/01/2019   PLT 260 07/01/2019   LYMPHOPCT 45.6 11/22/2017   MONOPCT 10.5 11/22/2017   EOSPCT 10.8 (H) 11/22/2017   BASOPCT 0.2 11/22/2017    CMP:  Lab Results  Component Value Date   NA 139 07/01/2019   K 4.4 07/01/2019   CL 108 07/01/2019   CO2 22 07/01/2019   BUN 15 07/01/2019   CREATININE 1.03 07/01/2019   PROT  5.4 (L) 06/29/2019   ALBUMIN 3.0 (L) 06/29/2019   BILITOT 0.7 06/29/2019   ALKPHOS 49 06/29/2019   AST 22 06/29/2019   ALT 19 06/29/2019  .   Total Time in preparing paper work, data evaluation and todays exam - 35 minutes  Huey Bienenstock M.D on 07/01/2019 at 4:35 PM  [facility]   Office  [phone]

## 2019-07-30 ENCOUNTER — Telehealth: Payer: Self-pay | Admitting: Internal Medicine

## 2019-07-30 NOTE — Telephone Encounter (Signed)
Medication name and strength: Breztri Provider: Sherene Sires Pharmacy: [facility] Patient insurance ID: [id number] M Phone: [phone] Fax: [phone]  Was the PA started on CMM?  yes If yes, please enter the Key: [id number] Timeframe for approval/denial: could take up to 5 days for determination. Since patient also has [employer], [employer] may need to be notified at [phone].  Routing to [place] to follow up on

## 2019-08-05 NOTE — Telephone Encounter (Signed)
Called [employer] and spoke with Seward Grater to check status of PA that was initiated. Per Seward Grater, the PA was approved  PA #: [id number]  Called pt's pharmacy and spoke with samvil letting him know the PA was approved and he verbalized understanding. Nothing further needed.

## 2019-09-02 IMAGING — DX DG CHEST 2V
2 series · 2 of 2 positions shown · non-contrast
Comparison: September 15, 2016

CLINICAL DATA: COPD.

EXAM:
CHEST - 2 VIEW

[chest pa]
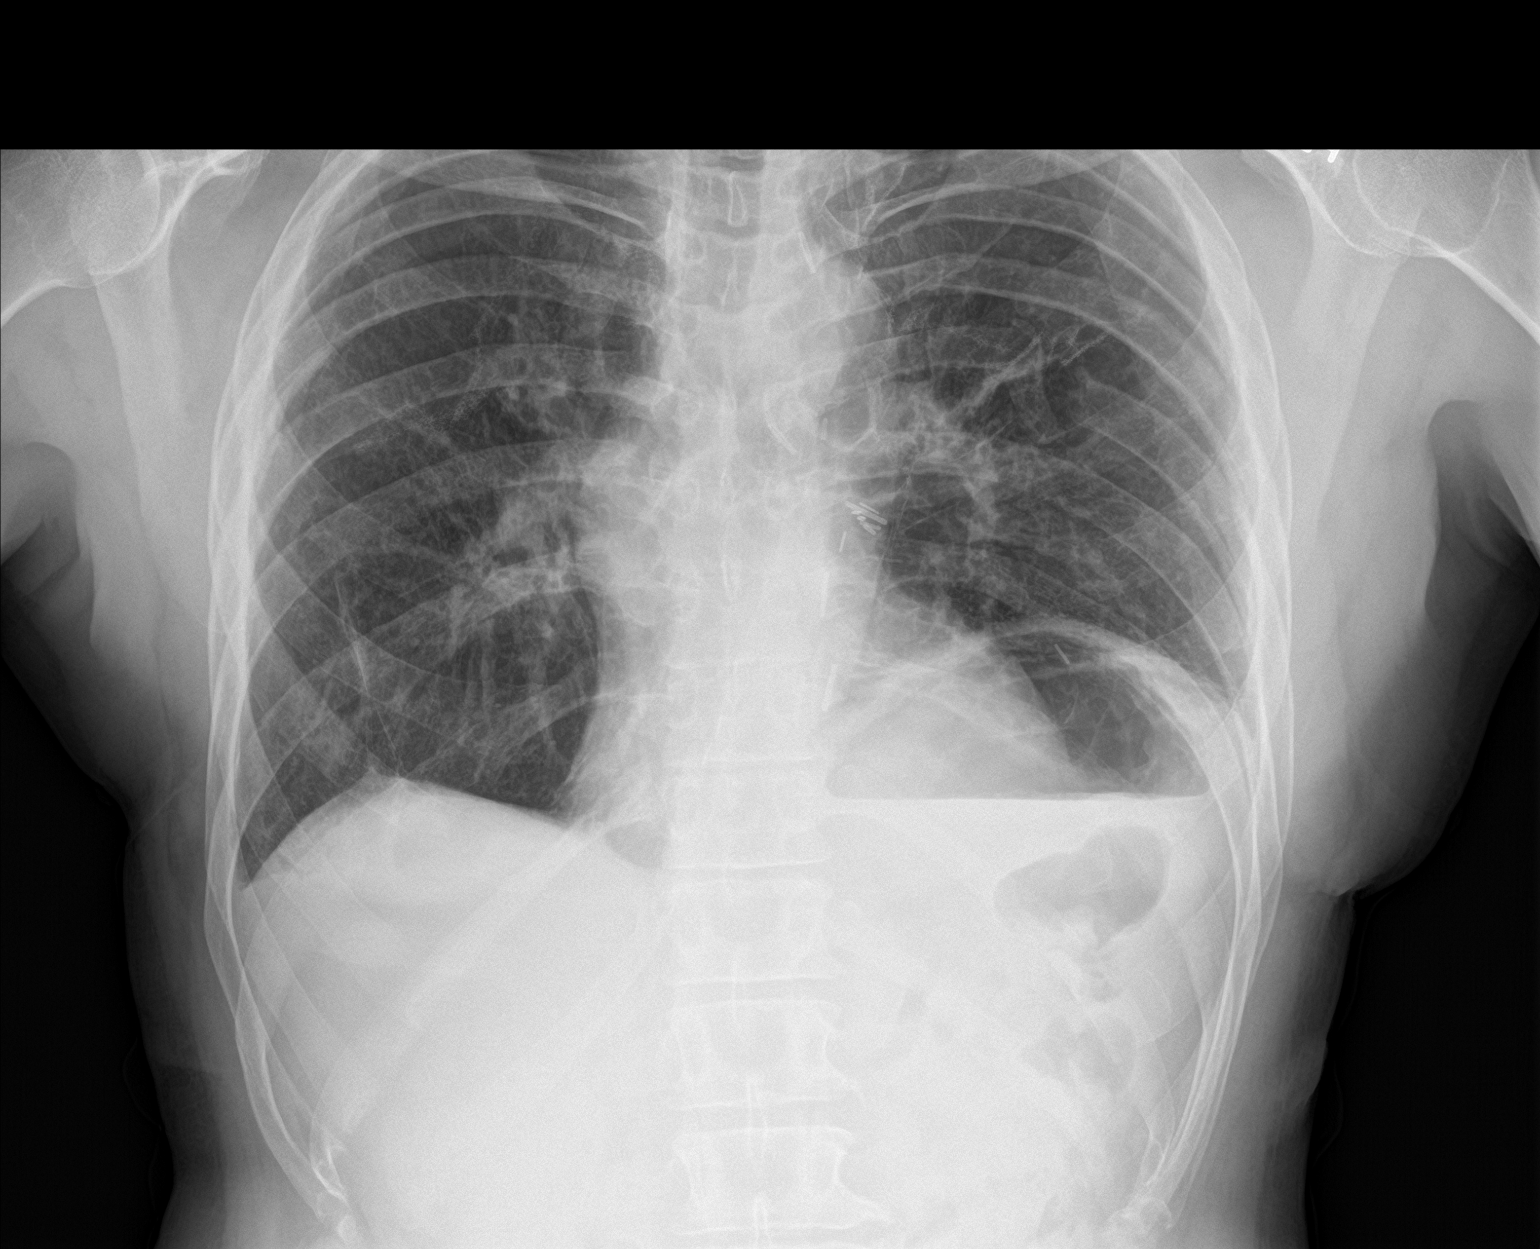

[chest lat]
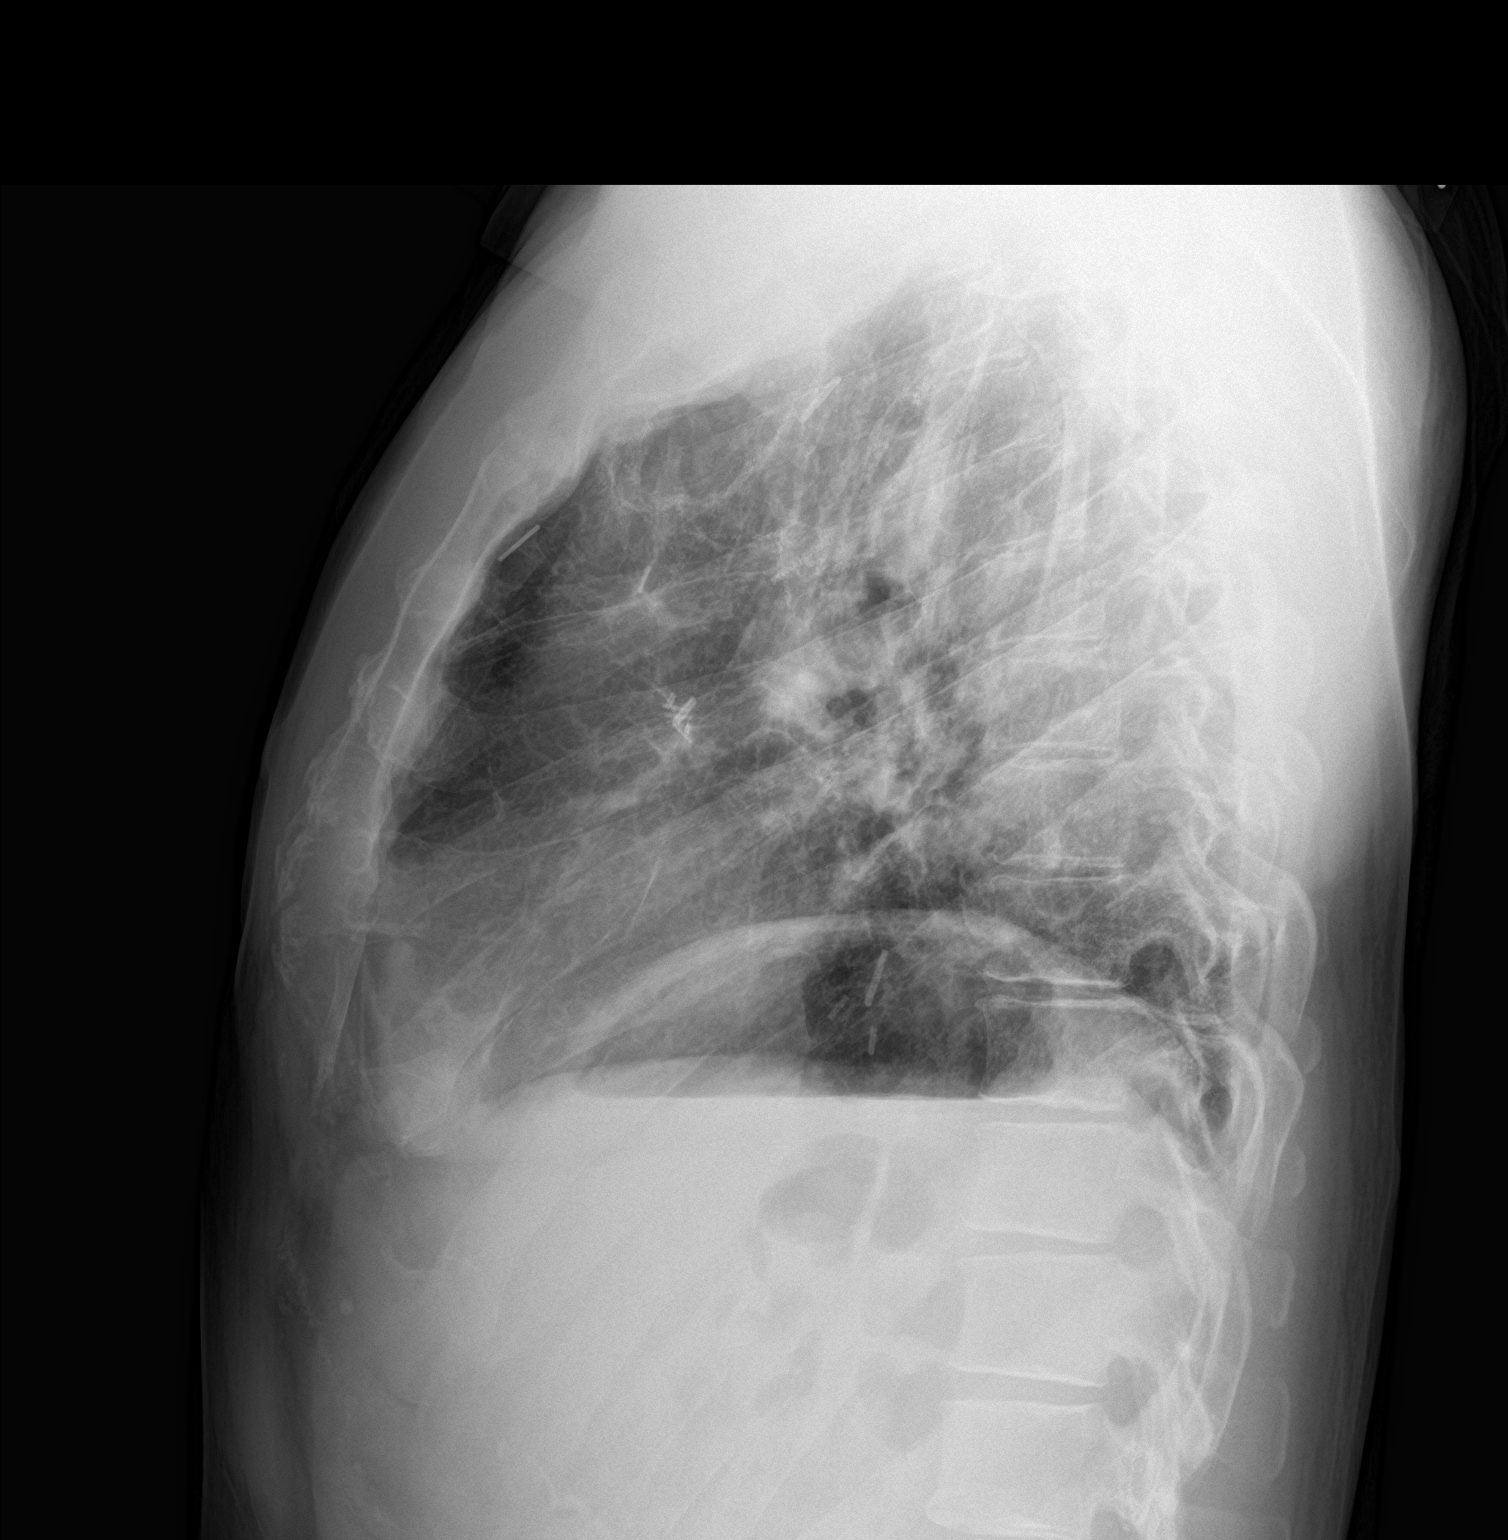

[2 of 2 positions shown; findings below may reference images not displayed]

FINDINGS: Stable postoperative changes in both lungs. There is elevation left
hemidiaphragm which is stable. The cardiomediastinal silhouette is
unchanged. No acute nodule, mass, or focal infiltrate.
IMPRESSION: Bilateral sequela of previous surgeries in the lungs. No acute
abnormalities identified.

## 2019-10-01 ENCOUNTER — Ambulatory Visit (HOSPITAL_COMMUNITY)
Admission: EM | Admit: 2019-10-01 | Discharge: 2019-10-01 | Disposition: A | Discharge status: Home / Self Care | Payer: Medicaid Other | Attending: Emergency Medicine | Admitting: Emergency Medicine

## 2019-10-01 ENCOUNTER — Encounter (HOSPITAL_COMMUNITY): Payer: Self-pay | Admitting: Emergency Medicine

## 2019-10-01 ENCOUNTER — Other Ambulatory Visit: Payer: Self-pay

## 2019-10-01 DIAGNOSIS — Z20822 Contact with and (suspected) exposure to covid-19: Secondary | ICD-10-CM | POA: Insufficient documentation

## 2019-10-01 DIAGNOSIS — Z113 Encounter for screening for infections with a predominantly sexual mode of transmission: Secondary | ICD-10-CM | POA: Insufficient documentation

## 2019-10-01 LAB — HIV ANTIBODY (ROUTINE TESTING W REFLEX): HIV Screen 4th Generation wRfx: NONREACTIVE

## 2019-10-01 LAB — SARS CORONAVIRUS 2 (TAT 6-24 HRS): SARS Coronavirus 2: NEGATIVE

## 2019-10-01 NOTE — Discharge Instructions (Signed)
COVID test pending, monitor mychart for results  We are testing you for HIV, Syphillis, Gonorrhea, Chlamydia and Trichomonas. We will call you if anything is positive and let you know if you require any further treatment. Please inform partner of any positive results.  Please return if symptoms not improving with treatment, development of fever, nausea, vomiting, abdominal pain, scrotal pain.

## 2019-10-01 NOTE — ED Provider Notes (Signed)
[facility]    CSN: [id number] Arrival date & time: 10/01/19  1001      History   Chief Complaint Chief Complaint  Patient presents with  . Exposure to STD  . Covid Exposure    HPI Glenn [name] is a 59 y.o. male history of hypertension, COPD, presenting today for Covid testing and STD screening.  Patient reports he recently had intercourse with a male and the condom broke.  Wishing to be screened for all STDs.  Denies any symptoms or exposures.  HPI  Past Medical History:  Diagnosis Date  . Arthritis   . Asthma   . COPD (chronic obstructive pulmonary disease) (HCC)   . ED (erectile dysfunction)   . Hypertension     Patient Active Problem List   Diagnosis Date Noted  . COPD with acute exacerbation (HCC) 06/30/2019  . Acute respiratory failure with hypoxemia (HCC) 06/30/2019  . Hypotension 06/30/2019  . Near syncope 06/30/2019  . Pleural nodule 06/30/2019  . Colon cancer screening 12/18/2017  . History of colonic polyps 12/18/2017  . Cigarette smoker 11/24/2017  . 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 COPD GOLD III/ actively smoking  10/05/2016    Past Surgical History:  Procedure Laterality Date  . LUNG SURGERY     removed tissue       Home Medications    Prior to Admission medications   Medication Sig Start Date End Date Taking? Authorizing Provider  albuterol (PROVENTIL HFA) 108 (90 Base) MCG/ACT inhaler Inhale 2 puffs into the lungs every 4 (four) hours as needed for wheezing or shortness of breath. 11/20/18  Yes [name], MD  albuterol (PROVENTIL) (2.5 MG/3ML) 0.083% nebulizer solution PLEASE SEE ATTACHED FOR DETAILED DIRECTIONS Patient taking differently: Take 2.5 mg by nebulization every 6 (six) hours as needed for wheezing or shortness of breath.  02/26/18  Yes [name], MD  amLODipine (NORVASC) 10 MG tablet Take 10 mg by mouth daily.   Yes [provider]  ASPIRIN LOW DOSE 81 MG EC tablet Take 81 mg by mouth daily.  04/29/19  Yes [provider]  budesonide-formoterol (SYMBICORT) 160-4.5 MCG/ACT inhaler Inhale 2 puffs into the lungs 2 (two) times daily. 05/13/19  Yes [name], MD  DULERA 200-5 MCG/ACT AERO Inhale 2 puffs into the lungs 2 (two) times daily. 03/27/19  Yes [provider]  gabapentin (NEURONTIN) 300 MG capsule Take 1 capsule (300 mg total) by mouth 3 (three) times daily. 02/09/18  Yes Eustace Moore, MD  nitroGLYCERIN (NITROSTAT) 0.4 MG SL tablet Place 0.4 mg under the tongue every 5 (five) minutes as needed for chest pain.   Yes [provider]  oxycodone (ROXICODONE) 30 MG immediate release tablet Take 30 mg by mouth every 6 (six) hours as needed (chronic pain).  04/29/19  Yes [provider]  pantoprazole (PROTONIX) 40 MG tablet Take 1 tablet (40 mg total) by mouth daily. 07/02/19  Yes Elgergawy, Leana Roe, MD  sildenafil (VIAGRA) 100 MG tablet Take 100 mg by mouth daily as needed for erectile dysfunction.   Yes [provider]  tamsulosin (FLOMAX) 0.4 MG CAPS capsule Take 0.4 mg by mouth every evening. 06/26/19  Yes [provider]  Vitamin D, Ergocalciferol, (DRISDOL) 1.25 MG (50000 UNIT) CAPS capsule Take 50,000 Units by mouth once a week. Mondays 05/02/19  Yes [provider]  predniSONE (STERAPRED UNI-PAK 21 TAB) 10 MG (21) TBPK tablet Use per package instructions 07/01/19   Elgergawy, Leana Roe,  MD    Family History Family History  Problem Relation Age of Onset  . Hypertension Mother   . Diabetes Maternal Grandmother   . Colon cancer Neg Hx   . Esophageal cancer Neg Hx   . Rectal cancer Neg Hx   . Inflammatory bowel disease Neg Hx   . Liver disease Neg Hx   . Pancreatic cancer Neg Hx     Social History Social History   Tobacco Use  . Smoking status: Current Every Day Smoker    Packs/day: 0.50    Years: 34.00    Pack years: 17.00    Types: Cigarettes  . Smokeless tobacco: Never Used  Vaping Use  . Vaping Use:  Never used  Substance Use Topics  . Alcohol use: Never  . Drug use: Never     Allergies   Ibuprofen, Shrimp [shellfish allergy], Tylenol [acetaminophen], and Lisinopril   Review of Systems Review of Systems  Constitutional: Negative for fever.  HENT: Negative for sore throat.   Respiratory: Negative for shortness of breath.   Cardiovascular: Negative for chest pain.  Gastrointestinal: Negative for abdominal pain, nausea and vomiting.  Genitourinary: Negative for difficulty urinating, discharge, dysuria, frequency, penile pain, penile swelling, scrotal swelling and testicular pain.  Skin: Negative for rash.  Neurological: Negative for dizziness, light-headedness and headaches.     Physical Exam Triage Vital Signs ED Triage Vitals  Enc Vitals Group     BP 10/01/19 1143 (!) 147/97     Pulse Rate 10/01/19 1143 86     Resp 10/01/19 1143 16     Temp 10/01/19 1143 98.6 F (37 C)     Temp Source 10/01/19 1143 Oral     SpO2 10/01/19 1143 100 %     Weight --      Height --      Head Circumference --      Peak Flow --      Pain Score 10/01/19 1140 0     Pain Loc --      Pain Edu? --      Excl. in GC? --    No data found.  Updated Vital Signs BP (!) 147/97 (BP Location: Right Arm)   Pulse 86   Temp 98.6 F (37 C) (Oral)   Resp 16   SpO2 100%   Visual Acuity Right Eye Distance:   Left Eye Distance:   Bilateral Distance:    Right Eye Near:   Left Eye Near:    Bilateral Near:     Physical Exam Vitals and nursing note reviewed.  Constitutional:      Appearance: He is well-developed.     Comments: No acute distress  HENT:     Head: Normocephalic and atraumatic.     Nose: Nose normal.  Eyes:     Conjunctiva/sclera: Conjunctivae normal.  Cardiovascular:     Rate and Rhythm: Normal rate.  Pulmonary:     Effort: Pulmonary effort is normal. No respiratory distress.  Abdominal:     General: There is no distension.  Musculoskeletal:        General: Normal range  of motion.     Cervical back: Neck supple.  Skin:    General: Skin is warm and dry.  Neurological:     Mental Status: He is alert and oriented to person, place, and time.      UC Treatments / Results  Labs (all labs ordered are listed, but only abnormal results are displayed) Labs Reviewed  SARS  CORONAVIRUS 2 (TAT 6-24 HRS)  HIV ANTIBODY (ROUTINE TESTING W REFLEX)  RPR  CYTOLOGY, (ORAL, ANAL, URETHRAL) ANCILLARY ONLY    EKG   Radiology No results found.  Procedures Procedures (including critical care time)  Medications Ordered in UC Medications - No data to display  Initial Impression / Assessment and Plan / UC Course  I have reviewed the triage vital signs and the nursing notes.  Pertinent labs & imaging results that were available during my care of the patient were reviewed by me and considered in my medical decision making (see chart for details).     Covid and STD screening pending.  Covid PCR, monitor my chart for results.  Urethral swab but HIV and RPR.  Currently asymptomatic, deferring any empiric treatment.  Discussed strict return precautions. Patient verbalized understanding and is agreeable with plan.  Final Clinical Impressions(s) / UC Diagnoses   Final diagnoses:  Screen for STD (sexually transmitted disease)  Encounter for laboratory testing for COVID-19 virus     Discharge Instructions     COVID test pending, monitor mychart for results  We are testing you for HIV, Syphillis, Gonorrhea, Chlamydia and Trichomonas. We will call you if anything is positive and let you know if you require any further treatment. Please inform partner of any positive results.  Please return if symptoms not improving with treatment, development of fever, nausea, vomiting, abdominal pain, scrotal pain.   ED Prescriptions    None     PDMP not reviewed this encounter.   Jaaziah Schulke, Carter C, PA-C 10/01/19 1300

## 2019-10-01 NOTE — ED Triage Notes (Signed)
Pt states he had sex on Saturday and the condom broke so he would like to be checked for STDs and covid.

## 2019-10-02 LAB — RPR: RPR Ser Ql: NONREACTIVE

## 2019-10-02 LAB — CYTOLOGY, (ORAL, ANAL, URETHRAL) ANCILLARY ONLY
Chlamydia: NEGATIVE
Comment: NEGATIVE
Comment: NEGATIVE
Comment: NORMAL
Neisseria Gonorrhea: POSITIVE — AB
Trichomonas: NEGATIVE

## 2019-10-07 ENCOUNTER — Other Ambulatory Visit: Payer: Self-pay

## 2019-10-07 ENCOUNTER — Telehealth (HOSPITAL_COMMUNITY): Payer: Self-pay | Admitting: Emergency Medicine

## 2019-10-07 ENCOUNTER — Ambulatory Visit (HOSPITAL_COMMUNITY)
Admission: EM | Admit: 2019-10-07 | Discharge: 2019-10-07 | Disposition: A | Discharge status: Home / Self Care | Payer: Medicaid Other | Attending: Family Medicine | Admitting: Family Medicine

## 2019-10-07 ENCOUNTER — Encounter (HOSPITAL_COMMUNITY): Payer: Self-pay | Admitting: Emergency Medicine

## 2019-10-07 DIAGNOSIS — A549 Gonococcal infection, unspecified: Secondary | ICD-10-CM | POA: Diagnosis not present

## 2019-10-07 MED ORDER — CEFTRIAXONE SODIUM 500 MG IJ SOLR
500.0000 mg | Freq: Once | INTRAMUSCULAR | Status: AC
  Administered 2019-10-07: 500 mg via INTRAMUSCULAR

## 2019-10-07 MED ORDER — CEFTRIAXONE SODIUM 500 MG IJ SOLR: 500.0000 mg | Freq: Once | INTRAMUSCULAR | Status: DC

## 2019-10-07 MED ORDER — CEFTRIAXONE SODIUM 500 MG IJ SOLR
INTRAMUSCULAR | Status: AC
  Filled 2019-10-07: qty 500

## 2019-10-07 MED ORDER — LIDOCAINE HCL (PF) 1 % IJ SOLN
INTRAMUSCULAR | Status: AC
  Filled 2019-10-07: qty 2

## 2019-10-07 NOTE — ED Notes (Signed)
No answer in lobby.

## 2019-10-07 NOTE — ED Triage Notes (Signed)
Pt presents for treatment for STD 

## 2019-10-07 NOTE — Telephone Encounter (Signed)
Patient made aware of results and need to return for treatment with 500mg  Rocephin IM. Patient states "I'm on my way".  Contacted patient by phone. Verified identity using two identifiers. Provided positive result. Reviewed safe sex practices, notifying partners, and refraining from sexual activities for 7 days from time of treatment. Patient verified understanding, all questions answered.   HHS notified

## 2019-10-07 NOTE — Discharge Instructions (Signed)
You have been treated for an STD. Please refrain from sexual intercourse for 7 days in order to allow the medication to work effectively.

## 2020-01-15 ENCOUNTER — Other Ambulatory Visit: Payer: Self-pay

## 2020-01-15 ENCOUNTER — Ambulatory Visit: Payer: Medicaid Other | Admitting: Internal Medicine

## 2020-01-15 ENCOUNTER — Encounter: Payer: Self-pay | Admitting: Internal Medicine

## 2020-01-15 DIAGNOSIS — J449 Chronic obstructive pulmonary disease, unspecified: Secondary | ICD-10-CM | POA: Diagnosis not present

## 2020-01-15 DIAGNOSIS — F17210 Nicotine dependence, cigarettes, uncomplicated: Secondary | ICD-10-CM | POA: Diagnosis not present

## 2020-01-15 MED ORDER — PREDNISONE 10 MG PO TABS
ORAL_TABLET | ORAL | 0 refills | Status: DC
Start: 2020-01-15 — End: 2020-04-13

## 2020-01-15 MED ORDER — SPIRIVA RESPIMAT 2.5 MCG/ACT IN AERS
2.0000 | INHALATION_SPRAY | Freq: Every day | RESPIRATORY_TRACT | 0 refills | Status: DC
Start: 2020-01-15 — End: 2020-01-20

## 2020-01-15 MED ORDER — SPIRIVA RESPIMAT 2.5 MCG/ACT IN AERS: INHALATION_SPRAY | RESPIRATORY_TRACT | 11 refills | Status: DC

## 2020-01-15 NOTE — Assessment & Plan Note (Signed)
Counseled re importance of smoking cessation but did not meet time criteria for separate billing         No need to return here unless following instructions one of which is to return with all active meds to assure adherence.    Each maintenance medication was reviewed in detail including emphasizing most importantly the difference between maintenance and prns and under what circumstances the prns are to be triggered using an action plan format where appropriate.  Total time for H and P, chart review, counseling, teaching device and generating customized AVS unique to this office visit / charting  > 30 min

## 2020-01-15 NOTE — Patient Instructions (Signed)
Plan A = Automatic = Always=    symbicort 160 Take 2 puffs first thing in am and then another 2 puffs about 12 hours later and spiriva 2 pffs each am   Prednisone 10 mg take  4 each am x 2 days,   2 each am x 2 days,  1 each am x 2 days and stop    Plan B = Backup (to supplement plan A, not to replace it) Only use your albuterol inhaler as a rescue medication to be used if you can't catch your breath by resting or doing a relaxed purse lip breathing pattern.  - The less you use it, the better it will work when you need it. - Ok to use the inhaler up to 2 puffs  every 4 hours if you must but call for appointment if use goes up over your usual need - Don't leave home without it !!  (think of it like the spare tire for your car)   Plan C = Crisis (instead of Plan B but only if Plan B stops working) - only use your albuterol nebulizer if you first try Plan B and it fails to help > ok to use the nebulizer up to every 4 hours but if start needing it regularly call for immediate appointment   The key is to stop smoking completely before smoking completely stops you!    I very strongly recommend you get the moderna or pfizer vaccine as soon as possible based on your risk of dying from the virus  and the proven safety and benefit of these vaccines against even the delta variant.  This can save your life as well as  those of your loved ones,  especially if they are also not vaccinated.     If you are satisfied with your treatment plan,  let your doctor know and he/she can either refill your medications or you can return here when your prescription runs out but you must bring all active medications with you if you want my help.

## 2020-01-15 NOTE — Assessment & Plan Note (Addendum)
Active smoker   - Spirometry 10/05/2016  FEV1 1.36 (52%)  Ratio 65 p saba with classic curvature - 10/05/2016  > try dulera 200 2bid   - Allergy profile 11/22/2017 >  Eos 0.6 /  IgE  25 RAST neg x for roaches/ neg for mold  - 11/22/2017   continue dulera 200 2bid  - PFT's  02/08/2018  FEV1 0.90 (36 % ) ratio 59   p multiple saba prior to study with DLCO  52 % corrects to 106  % for alv volume   02/08/2018  After extensive coaching inhaler device,  effectiveness =    90% from a baseline of 75% with smi so try spiriva respimat - 08/14/2018    changed to trelegy but preferred symb/spriva - 11/20/2018     Breztri > better but not covered by medicaid - 03/26/2019  After extensive coaching inhaler device,  effectiveness =    75% (short Ti) > rechallenge with Breztri > not covered, resumed symb 160 but only took prn - 01/15/2020 re-started on symb 160/spiriva 2.5 daily    Group D in terms of symptom/risk and laba/lama/ICS  therefore appropriate rx at this point >>>  spiriva / symbicort "like high performance tires"  Vs saba prn like a spare  Re saba: I spent extra time with pt today reviewing appropriate use of albuterol for prn use on exertion with the following points: 1) saba is for relief of sob that does not improve by walking a slower pace or resting but rather if the pt does not improve after trying this first. 2) If the pt is convinced, as many are, that saba helps recover from activity faster then it's easy to tell if this is the case by re-challenging : ie stop, take the inhaler, then p 5 minutes try the exact same activity (intensity of workload) that just caused the symptoms and see if they are substantially diminished or not after saba 3) if there is an activity that reproducibly causes the symptoms, try the saba 15 min before the activity on alternate days   If in fact the saba really does help, then fine to continue to use it prn but advised may need to look closer at the maintenance regimen being  used to achieve better control of airways disease with exertion.    Re Covid vaccine: Patient is unvaccinated and was informed of the seriousness of COVID 19 infection as a direct risk to lung health as well as safety to close contacts and should continue to wear a facemask in public and minimize exposure to public locations but especially avoid any area or activity where non-close contacts are not observing distancing or wearing an appropriate face mask.  I strongly recommended pt take either of the vaccines available through local drugstores based on updated information on millions of Americans treated with the Moderna and Pfizer products  which have proven both safe and  effective even against the new delta variant.

## 2020-01-15 NOTE — Progress Notes (Signed)
Subjective:     Patient ID: Glenn [name], male   DOB: 1961-01-26     MRN: [id number]      Brief patient profile:  72 yobm active smoker  with onset of symptoms in 1995 while in [place] dx as asthma vs sarcoid and rx pred/ theoph / albuterol seen by allergist dx as pollen moved to [place] where underwent bilateral lung surgery 2011 ? Lung vol reduction ? Helped breathing at the time but [facility] and  [facility] = can't walk a nl pace on a flat grade s sob but does fine slow and flat so referred to pulmonary clinic 10/05/2016 by Dr  Tyson Dense      History of Present Illness  10/05/2016 1st [facility] office visit/ Glenn [name]   Chief Complaint  Patient presents with  . Pulmonary Consult    Referred by Dr. Alveta Heimlich for eval of Asthma and COPD. Pt states he was dxed with COPD and Asthma in 1995. He moved here a year ago from [place], [place]. He states his breathing is "okay" today. He states he has a "slight cold"- prod cough with minimal brown sputum.    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  Work on [employer] B = Backup Only use your albuterol (proair) as a rescue medication Plan C = Crisis - only use your albuterol nebulizer if you first try Plan B and it fails to help > ok to use the nebulizer up to every 4 hours but if start needing it regularly call for immediate appointment Please schedule a follow up office visit in 6 weeks, call sooner if needed with pfts     11/22/2017  f/u ov/Glenn [name] re:  GOLD II/ still smoking   Chief Complaint  Patient presents with  . Follow-up    Breathing is overall doing okay. He is wheezing and has occ non prod cough. He is using his albuterol inhaler and neb both 3-4 x daily.   Dyspnea:  MMRC2 = can't walk a nl pace on a flat grade s sob but does fine slow and flat  Cough: mostly dry, worse in house where he thinks there is mold Sleeping: some wheezing noct/ freq saba noct rec Prednisone 10 mg take  4 each am x 2 days,   2 each am x 2 days,  1 each am x 2 days and stop  Plan A = Automatic = dulera 200 Take 2 puffs first thing in am and then another 2 puffs about 12 hours later.  Work on inhaler technique: Plan B = Backup Only use your albuterol (proair) as a rescue medication  Plan C = Crisis - only use your albuterol nebulizer if you first try Plan B  The key is to stop smoking completely before smoking completely stops you!      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 Follow-up    Pt had PFT prior to ov today.  Dyspnea:  MMRC2 = can't walk a nl pace on a flat grade s sob but does fine slow and flat / worse with cold x one week Cough: more with cold, not producing any mucus  Sleeping: 2 pillows, bed flat SABA use: when not having a cold  Maybe once a day 02: none  rec Prednisone 10 mg take  4 each am x 2 days,   2 each am x 2 days,  1 each am x 2 days and stop  Plan A = Automatic = dulera 200 Take 2 puffs first thing in am and then another 2 puffs about 12 hours later  And spiriva 2 puffs each am  Work on inhaler technique: Plan B = Backup Only use your albuterol (proair= Ventolin)  as a rescue medication Plan C = Crisis - only use your albuterol nebulizer if you first try Plan B and it fails to help > ok to use the nebulizer up to every 4 hours but if start needing it regularly call for immediate appointment The key is to stop smoking completely before smoking  completely stops you!    08/14/2018  f/u ov/Glenn [name] re: GOLD II/ active smoking/ on dulera 200/ spiriva  Chief Complaint  Patient presents with  . Follow-up    Breathing is some worse with hot/humid weather. He states he does not have a rescue inhaler. He uses his neb 3 x daily on average.   Dyspnea:  MMRC2 = can't walk a nl pace on a flat grade s sob but does fine slow and flat  Cough: min mucoid esp in am Sleeping: bed is flat, one pillow / rarely wakes up needs neb  SABA use: way too much neb saba ,no hfa on hand  02: none  rec Plan A = Automatic = trelegy one click each am, take two good drags  Plan B = Backup Only use your albuterol inhaler as a rescue medication t Plan C = Crisis - only use your albuterol nebulizer if you first try Plan B and it fails to help > ok to use the nebulizer up to every 4 hours but if start needing it regularly call for immediate appointment Prednisone 10 mg take  4 each am x 2 days,   2 each am x 2 days,  1 each am x 2 days and stop  Please schedule a follow up visit in 3 months but call sooner if needed  with all medications /inhalers/ solutions in hand so we can verify exactly what you are taking. This includes all medications from all doctors and over the counters Add: cxr on return     11/20/2018  f/u ov/Glenn [name] re:  Copd/ still smoking and meds empty Chief Complaint  Patient presents with  . Follow-up    Breathing is "okay" but he is using his nebulizer with albuterol 3-4 x per day. He came in today with a empty Dulera inhaler and states this is what he has been using.   Dyspnea:  MMRC2 = can't walk a nl pace on a flat grade s sob but does fine slow and flat Cough: no  Sleeping: ok flat  SABA use: as above -was much less until ran out of maint rx  02: none  rec  Plan  A = Automatic = Breztri Take 2 puffs first thing in am and then another 2 puffs about 12 hours later.  Work on inhaler technique If not happy, resume dulera and spiriva like before   Plan B = Backup Only use your albuterol inhaler as a rescue medication Plan C = Crisis - only use your albuterol nebulizer if you first try Plan B and it fails to help > ok to use the nebulizer up to every 4 hours but if start needing it regularly call for immediate appointment Prednisone 10 mg take  4 each am x 2 days,   2 each am x 2 days,  1 each am x 2 days and stop  The key is to stop smoking completely before smoking completely stops you! Please schedule a follow up office visit in 4 weeks, sooner if needed  with all medications /inhalers/ solutions in hand so we can verify exactly what you are taking. This includes all medications from all doctors and over the counters   03/26/2019  f/u ov/Glenn [name] re:  COPD II/still still smoker / did not bring meds but only on dulera 200 now "when he can get it"  Chief Complaint  Patient presents with  . Follow-up    Pt c/o non prod cough and wheezing x 2 wks. He is using his albuterol inhaler and neb both about 4 x per day.   Dyspnea: highly variable / not consistent with meds Cough: better/ sporadic and dry day > noct   Sleeping: at least once a night wakes up sob / wheezing / uses saba SABA use: way too much  02: none  Prednisone does help all his symptoms including the cough but not really clear it reduces his saba dependency  rec Plan A = Automatic = Always=    Breztri Take 2 puffs first thing in am and then another 2 puffs about 12 hours later.  Work on inhaler technique:  Plan B = Backup (to supplement plan A, not to replace it) Only use your albuterol inhaler as a rescue medication Plan C = Crisis (instead of Plan B but only if Plan B stops working) - only use your albuterol nebulizer if you first try Plan B and it fails to help > ok to use the nebulizer up to every 4 hours but if start needing it regularly call for immediate appointment Prednisone 10 mg take  4 each am x 2 days,   2 each am x 2 days,  1 each am x 2 days and stop  The key is  to stop smoking completely before smoking completely stops you! Try albuterol 15 min before an activity that you know would make you short of breath   Please schedule a follow up office visit in 6 weeks, call sooner if needed with all medications /inhalers/ solutions in hand so we can verify exactly what you are taking. This includes all medications from all doctors and over the counters     01/15/2020  f/u ov/Glenn [name] re:  Copd II / still smoking / no really on maint symb/ doesn't remember rx with spiriva previously  Chief Complaint  Patient presents with  . Follow-up    Breathing is overall doing well. He states that he uses his albuterol inhaler about every 4 hours and uses neb every night.   Dyspnea:  MMRC2 = can't walk a nl pace on a flat grade s sob but does fine slow and flat  Cough: none  Sleeping: 3  h p lie down needs nebulizer  SABA use: way too much /totally confused on maint vs prns 02: none  Prednisone always helps and when it does he tends to stop everything not understanding the difference between maint and prns  No obvious day to day or daytime variability or assoc excess/ purulent sputum or mucus plugs or hemoptysis or cp or chest tightness, subjective wheeze or overt sinus or hb symptoms.     Also denies any obvious fluctuation of symptoms with weather or environmental changes or other aggravating or alleviating factors except as outlined above   No unusual exposure hx or h/o childhood pna/ asthma or knowledge of premature birth.  Current Allergies, Complete Past Medical History, Past Surgical History, Family History, and Social History were reviewed in [employer] record.  ROS  The following are not active complaints unless bolded Hoarseness, sore throat, dysphagia, dental problems, itching, sneezing,  nasal congestion or discharge of excess mucus or purulent secretions, ear ache,   fever, chills, sweats, unintended wt loss or wt gain, classically  pleuritic or exertional cp,  orthopnea pnd or arm/hand swelling  or leg swelling, presyncope, palpitations, abdominal pain, anorexia, nausea, vomiting, diarrhea  or change in bowel habits or change in bladder habits, change in stools or change in urine, dysuria, hematuria,  rash, arthralgias, visual complaints, headache, numbness, weakness or ataxia or problems with walking or coordination,  change in mood or  memory.        Current Meds  Medication Sig  . albuterol (PROVENTIL HFA) 108 (90 Base) MCG/ACT inhaler Inhale 2 puffs into the lungs every 4 (four) hours as needed for wheezing or shortness of breath.  Marland Kitchen albuterol (PROVENTIL) (2.5 MG/3ML) 0.083% nebulizer solution PLEASE SEE ATTACHED FOR DETAILED DIRECTIONS (Patient taking differently: Take 2.5 mg by nebulization every 6 (six) hours as needed for wheezing or shortness of breath. )  . amLODipine (NORVASC) 10 MG tablet Take 10 mg by mouth daily.  . ASPIRIN LOW DOSE 81 MG EC tablet Take 81 mg by mouth daily.  . budesonide-formoterol (SYMBICORT) 160-4.5 MCG/ACT inhaler Inhale 2 puffs into the lungs 2 (two) times daily.  Marland Kitchen gabapentin (NEURONTIN) 300 MG capsule Take 1 capsule (300 mg total) by mouth 3 (three) times daily.  . nitroGLYCERIN (NITROSTAT) 0.4 MG SL tablet Place 0.4 mg under the tongue every 5 (five) minutes as needed for chest pain.  Marland Kitchen oxycodone (ROXICODONE) 30 MG immediate release tablet Take 30 mg by mouth every 6 (six) hours as needed (chronic pain).   . pantoprazole (PROTONIX) 40 MG tablet Take 1 tablet (40 mg total) by mouth daily.  . predniSONE (STERAPRED UNI-PAK 21 TAB) 10 MG (21) TBPK tablet Use per package instructions  . sildenafil (VIAGRA) 100 MG tablet Take 100 mg by mouth daily as needed for erectile dysfunction.  . tamsulosin (FLOMAX) 0.4 MG CAPS capsule Take 0.4 mg by mouth every evening.  . Vitamin D, Ergocalciferol, (DRISDOL) 1.25 MG (50000 UNIT) CAPS capsule Take 50,000 Units by mouth once a week. Mondays                       Objective:   Physical Exam   01/15/2020       140 03/26/2019        144  11/20/2018      144  08/14/2018          144  02/08/2018          143  11/22/17  142 lb (64.4 kg)  10/05/16 131 lb 12.8 oz (59.8 kg)    amb bm nad  Vital signs reviewed  01/15/2020  - Note at rest 02 sats  99% on RA  HEENT : pt wearing mask not removed for exam due to covid -19 concerns.    NECK :  without JVD/Nodes/TM/ nl carotid upstrokes bilaterally   LUNGS: no acc muscle use,  Mod barrel  contour chest wall with bilateral  Distant bs s audible wheeze and  without cough on insp or exp maneuvers and mod  Hyperresonant  to  percussion bilaterally     CV:  RRR  no s3 or murmur or increase in P2, and no edema   ABD:  soft and nontender with pos mid insp Hoover's  in the supine position. No bruits or organomegaly appreciated, bowel sounds nl  MS:     ext warm without deformities, calf tenderness, cyanosis or clubbing No obvious joint restrictions   SKIN: warm and dry without lesions    NEURO:  alert, approp, nl sensorium with  no motor or cerebellar deficits apparent.                 Assessment:

## 2020-01-20 ENCOUNTER — Ambulatory Visit (HOSPITAL_COMMUNITY)
Admission: EM | Admit: 2020-01-20 | Discharge: 2020-01-20 | Disposition: A | Discharge status: Home / Self Care | Payer: Medicaid Other | Attending: Family Medicine | Admitting: Family Medicine

## 2020-01-20 ENCOUNTER — Other Ambulatory Visit: Payer: Self-pay

## 2020-01-20 ENCOUNTER — Ambulatory Visit (HOSPITAL_COMMUNITY)
Admission: EM | Admit: 2020-01-20 | Discharge: 2020-01-20 | Disposition: A | Discharge status: Home / Self Care | Payer: Medicaid Other

## 2020-01-20 DIAGNOSIS — J439 Emphysema, unspecified: Secondary | ICD-10-CM | POA: Diagnosis present

## 2020-01-20 DIAGNOSIS — Z7251 High risk heterosexual behavior: Secondary | ICD-10-CM | POA: Diagnosis present

## 2020-01-20 DIAGNOSIS — R030 Elevated blood-pressure reading, without diagnosis of hypertension: Secondary | ICD-10-CM | POA: Diagnosis present

## 2020-01-20 DIAGNOSIS — Z20822 Contact with and (suspected) exposure to covid-19: Secondary | ICD-10-CM | POA: Diagnosis present

## 2020-01-20 LAB — SARS CORONAVIRUS 2 (TAT 6-24 HRS): SARS Coronavirus 2: NEGATIVE

## 2020-01-20 NOTE — ED Triage Notes (Signed)
Pt reports His Pain clinic DR decreased the oxycodone to 10 mg and has run out of pills. Pt also wants a STD.

## 2020-01-20 NOTE — ED Provider Notes (Signed)
[facility]    CSN: [id number] Arrival date & time: 01/20/20  4098      History   Chief Complaint Chief Complaint  Patient presents with  . Back Pain    Pt wants refill of OXYcodone .    HPI Glenn [name] is a 59 y.o. male.   HPI   Patient is here with multiple complaints.  He has had unprotected sexual relations.  He has no symptoms, however, he wants to be checked "just to be safe".  Last time he came in with a similar complaint he was positive for gonorrhea.  Currently not having any dysuria or discharge. Patient also states he wants to be tested for Covid.  He has not been around anyone with Covid.  He has not had Covid vaccinations.  He states that he feels he needs periodic testing because he has "severe lung disease".  He does continue to smoke cigarettes.  He does use his inhalers.  I explained to him that the CDC guidelines he should get the vaccinations, and a booster.  He does not need periodic testing the most exposed, unless needed for a procedure, or unless he has symptoms.  Patient insists he would like to have testing anyway. 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  He is under the care of a pain provider.  He previously took oxycodone 30 mg 6 times a day.  This results reduce to 20 mg.  Then 10.  His last visit he got 105 tablets of the 10 mg oxycodone.  He states he has taken the wall.  He is requesting a refill for me.  I have told him that all negotiations for oxycodone need to come from his pain provider, and he will receive no oxycodone from this provider  Past Medical History:  Diagnosis Date  . Arthritis   . Asthma   . COPD (chronic obstructive pulmonary disease) (HCC)   . ED (erectile dysfunction)   . Hypertension     Patient Active Problem List   Diagnosis Date Noted  . COPD with acute exacerbation (HCC) 06/30/2019  . Acute respiratory failure with hypoxemia (HCC) 06/30/2019  . Hypotension 06/30/2019  . Near  syncope 06/30/2019  . Pleural nodule 06/30/2019  . Colon cancer screening 12/18/2017  . History of colonic polyps 12/18/2017  . Cigarette smoker 11/24/2017  . 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 COPD GOLD III/ actively smoking  10/05/2016    Past Surgical History:  Procedure Laterality Date  . LUNG SURGERY     removed tissue       Home Medications    Prior to Admission medications   Medication Sig Start Date End Date Taking? Authorizing Provider  albuterol (PROVENTIL HFA) 108 (90 Base) MCG/ACT inhaler Inhale 2 puffs into the lungs every 4 (four) hours as needed for wheezing or shortness of breath. 11/20/18  Yes [name], MD  albuterol (PROVENTIL) (2.5 MG/3ML) 0.083% nebulizer solution PLEASE SEE ATTACHED FOR DETAILED DIRECTIONS Patient taking differently: Take 2.5 mg by nebulization every 6 (six) hours as needed for wheezing or shortness of breath. 02/26/18  Yes [name], MD  amLODipine (NORVASC) 10 MG tablet Take 10 mg by mouth daily.   Yes [provider]  ASPIRIN LOW DOSE 81 MG EC tablet Take 81 mg by mouth daily. 04/29/19  Yes [provider]  budesonide-formoterol (SYMBICORT) 160-4.5 MCG/ACT inhaler Inhale 2 puffs into the lungs 2 (two) times daily. 05/13/19  Yes Sandrea Hughs  B, MD  gabapentin (NEURONTIN) 300 MG capsule Take 1 capsule (300 mg total) by mouth 3 (three) times daily. 02/09/18  Yes Eustace Moore, MD  nitroGLYCERIN (NITROSTAT) 0.4 MG SL tablet Place 0.4 mg under the tongue every 5 (five) minutes as needed for chest pain.   Yes [provider]  pantoprazole (PROTONIX) 40 MG tablet Take 1 tablet (40 mg total) by mouth daily. 07/02/19  Yes Elgergawy, Leana Roe, MD  predniSONE (DELTASONE) 10 MG tablet Take  4 each am x 2 days,   2 each am x 2 days,  1 each am x 2 days and stop 01/15/20  Yes [name], MD  tamsulosin (FLOMAX) 0.4 MG CAPS capsule Take 0.4 mg by mouth every evening. 06/26/19  Yes [provider]  Tiotropium  Bromide Monohydrate (SPIRIVA RESPIMAT) 2.5 MCG/ACT AERS 2 puffs each am 01/15/20  Yes [name], MD  Vitamin D, Ergocalciferol, (DRISDOL) 1.25 MG (50000 UNIT) CAPS capsule Take 50,000 Units by mouth once a week. Mondays 05/02/19  Yes [provider]  sildenafil (VIAGRA) 100 MG tablet Take 100 mg by mouth daily as needed for erectile dysfunction.    [provider]    Family History Family History  Problem Relation Age of Onset  . Hypertension Mother   . Diabetes Maternal Grandmother   . Colon cancer Neg Hx   . Esophageal cancer Neg Hx   . Rectal cancer Neg Hx   . Inflammatory bowel disease Neg Hx   . Liver disease Neg Hx   . Pancreatic cancer Neg Hx     Social History Social History   Tobacco Use  . Smoking status: Current Every Day Smoker    Packs/day: 0.50    Years: 34.00    Pack years: 17.00    Types: Cigarettes  . Smokeless tobacco: Never Used  Vaping Use  . Vaping Use: Never used  Substance Use Topics  . Alcohol use: Never  . Drug use: Never     Allergies   Ibuprofen, Shrimp [shellfish allergy], Tylenol [acetaminophen], and Lisinopril   Review of Systems Review of Systems See HPI  Physical Exam Triage Vital Signs ED Triage Vitals  Enc Vitals Group     BP 01/20/20 1025 (!) 152/104     Pulse Rate 01/20/20 1025 78     Resp 01/20/20 1025 15     Temp 01/20/20 1025 97.8 F (36.6 C)     Temp Source 01/20/20 1025 Oral     SpO2 01/20/20 1025 100 %     Weight 01/20/20 1030 142 lb (64.4 kg)     Height 01/20/20 1030 5\' 5"  (1.651 m)     Head Circumference --      Peak Flow --      Pain Score 01/20/20 1028 7     Pain Loc --      Pain Edu? --      Excl. in GC? --    No data found.  Updated Vital Signs BP (!) 152/104 (BP Location: Right Arm)   Pulse 78   Temp 97.8 F (36.6 C) (Oral)   Resp 15   Ht 5\' 5"  (1.651 m)   Wt 64.4 kg   SpO2 100%   BMI 23.63 kg/m      Physical Exam Constitutional:      General: He is not in acute  distress.    Appearance: He is well-developed and well-nourished.     Comments: Exam by observation.  Mask is  in place  HENT:     Head: Normocephalic and atraumatic.     Mouth/Throat:     Mouth: Oropharynx is clear and moist.  Eyes:     Conjunctiva/sclera: Conjunctivae normal.     Pupils: Pupils are equal, round, and reactive to light.  Cardiovascular:     Rate and Rhythm: Normal rate.  Pulmonary:     Effort: Pulmonary effort is normal. No respiratory distress.  Abdominal:     General: There is no distension.     Palpations: Abdomen is soft.  Musculoskeletal:        General: No edema. Normal range of motion.     Cervical back: Normal range of motion.  Skin:    General: Skin is warm and dry.  Neurological:     Mental Status: He is alert.  Psychiatric:        Behavior: Behavior normal.      UC Treatments / Results  Labs (all labs ordered are listed, but only abnormal results are displayed) Labs Reviewed  SARS CORONAVIRUS 2 (TAT 6-24 HRS)  CYTOLOGY, (ORAL, ANAL, URETHRAL) ANCILLARY ONLY    EKG   Radiology No results found.  Procedures Procedures (including critical care time)  Medications Ordered in UC Medications - No data to display  Initial Impression / Assessment and Plan / UC Course  I have reviewed the triage vital signs and the nursing notes.  Pertinent labs & imaging results that were available during my care of the patient were reviewed by me and considered in my medical decision making (see chart for details).    Patient is informed that his blood pressure is elevated.  He is advised to follow-up with his primary care physician Final Clinical Impressions(s) / UC Diagnoses   Final diagnoses:  Encounter for laboratory testing for COVID-19 virus  Unprotected sex  Pulmonary emphysema, unspecified emphysema type (HCC)  Elevated blood pressure reading     Discharge Instructions     Follow with your pain doctor for medicine adjustments Follow with  your PCP for elevated blood pressure Check your test results on My chart   ED Prescriptions    None     I have reviewed the PDMP during this encounter.   Eustace Moore, MD 01/20/20 [phone]

## 2020-01-20 NOTE — ED Triage Notes (Signed)
PT is requesting a STD check and a COVID  Test ,no SX's

## 2020-01-20 NOTE — Discharge Instructions (Addendum)
Follow with your pain doctor for medicine adjustments Follow with your PCP for elevated blood pressure Check your test results on My chart

## 2020-01-20 NOTE — ED Triage Notes (Signed)
PT  Contacted by by phone call.after multiple cals in front lobby. Pt reported he left to get something to eat. Pt was informed he would be DC and would hve to register on his return

## 2020-01-21 LAB — CYTOLOGY, (ORAL, ANAL, URETHRAL) ANCILLARY ONLY
Chlamydia: NEGATIVE
Comment: NEGATIVE
Comment: NEGATIVE
Comment: NORMAL
Neisseria Gonorrhea: NEGATIVE
Trichomonas: NEGATIVE

## 2020-01-30 ENCOUNTER — Other Ambulatory Visit: Payer: Self-pay

## 2020-01-30 ENCOUNTER — Ambulatory Visit (HOSPITAL_COMMUNITY)
Admission: EM | Admit: 2020-01-30 | Discharge: 2020-01-30 | Disposition: A | Discharge status: Home / Self Care | Payer: Medicaid Other | Attending: Student | Admitting: Student

## 2020-01-30 ENCOUNTER — Encounter (HOSPITAL_COMMUNITY): Payer: Self-pay | Admitting: Emergency Medicine

## 2020-01-30 DIAGNOSIS — S39012D Strain of muscle, fascia and tendon of lower back, subsequent encounter: Secondary | ICD-10-CM

## 2020-01-30 DIAGNOSIS — S161XXD Strain of muscle, fascia and tendon at neck level, subsequent encounter: Secondary | ICD-10-CM

## 2020-01-30 MED ORDER — METAXALONE 800 MG PO TABS
800.0000 mg | ORAL_TABLET | Freq: Three times a day (TID) | ORAL | 0 refills | Status: DC
Start: 2020-01-30 — End: 2021-04-19

## 2020-01-30 MED ORDER — PREDNISONE 10 MG PO TABS
10.0000 mg | ORAL_TABLET | Freq: Every day | ORAL | 0 refills | Status: DC
Start: 2020-01-30 — End: 2020-04-13

## 2020-01-30 NOTE — Discharge Instructions (Addendum)
Use the muscle relaxer (Metaxalone) as needed for back pain. You can take this 3x daily. Also take 1 additional pill of prednisone daily for 5 days.   Follow-up with PCP if continued pain. If your symptoms worsen despite this treatment plan, seek additional medical attention. Seek medical attention if you develop numbness in legs, new weakness in arms/legs, bowel or bladder incontinence, chest pain, new shortness of breath.
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
## 2020-02-08 ENCOUNTER — Other Ambulatory Visit: Payer: Self-pay

## 2020-02-08 ENCOUNTER — Ambulatory Visit (HOSPITAL_COMMUNITY)
Admission: EM | Admit: 2020-02-08 | Discharge: 2020-02-08 | Discharge status: Against Medical Advice | Payer: Medicaid Other

## 2020-04-11 ENCOUNTER — Emergency Department (HOSPITAL_COMMUNITY): Payer: Medicaid Other

## 2020-04-11 ENCOUNTER — Other Ambulatory Visit: Payer: Self-pay

## 2020-04-11 ENCOUNTER — Encounter (HOSPITAL_COMMUNITY): Payer: Self-pay

## 2020-04-11 ENCOUNTER — Inpatient Hospital Stay (HOSPITAL_COMMUNITY)
Admission: EM | Admit: 2020-04-11 | Discharge: 2020-04-13 | DRG: 190 | Disposition: A | Discharge status: Home / Self Care | Payer: Medicaid Other | Attending: Internal Medicine | Admitting: Internal Medicine

## 2020-04-11 DIAGNOSIS — Z7952 Long term (current) use of systemic steroids: Secondary | ICD-10-CM

## 2020-04-11 DIAGNOSIS — Z8249 Family history of ischemic heart disease and other diseases of the circulatory system: Secondary | ICD-10-CM | POA: Diagnosis not present

## 2020-04-11 DIAGNOSIS — J9621 Acute and chronic respiratory failure with hypoxia: Secondary | ICD-10-CM | POA: Diagnosis present

## 2020-04-11 DIAGNOSIS — T380X5A Adverse effect of glucocorticoids and synthetic analogues, initial encounter: Secondary | ICD-10-CM | POA: Diagnosis present

## 2020-04-11 DIAGNOSIS — Z7951 Long term (current) use of inhaled steroids: Secondary | ICD-10-CM | POA: Diagnosis not present

## 2020-04-11 DIAGNOSIS — Z79899 Other long term (current) drug therapy: Secondary | ICD-10-CM

## 2020-04-11 DIAGNOSIS — Z888 Allergy status to other drugs, medicaments and biological substances status: Secondary | ICD-10-CM | POA: Diagnosis not present

## 2020-04-11 DIAGNOSIS — R739 Hyperglycemia, unspecified: Secondary | ICD-10-CM | POA: Diagnosis present

## 2020-04-11 DIAGNOSIS — J449 Chronic obstructive pulmonary disease, unspecified: Secondary | ICD-10-CM | POA: Diagnosis present

## 2020-04-11 DIAGNOSIS — I10 Essential (primary) hypertension: Secondary | ICD-10-CM | POA: Diagnosis present

## 2020-04-11 DIAGNOSIS — Z20822 Contact with and (suspected) exposure to covid-19: Secondary | ICD-10-CM | POA: Diagnosis present

## 2020-04-11 DIAGNOSIS — E1165 Type 2 diabetes mellitus with hyperglycemia: Secondary | ICD-10-CM | POA: Diagnosis present

## 2020-04-11 DIAGNOSIS — F17210 Nicotine dependence, cigarettes, uncomplicated: Secondary | ICD-10-CM | POA: Diagnosis present

## 2020-04-11 DIAGNOSIS — R778 Other specified abnormalities of plasma proteins: Secondary | ICD-10-CM

## 2020-04-11 DIAGNOSIS — Z7982 Long term (current) use of aspirin: Secondary | ICD-10-CM | POA: Diagnosis not present

## 2020-04-11 DIAGNOSIS — R7989 Other specified abnormal findings of blood chemistry: Secondary | ICD-10-CM | POA: Diagnosis not present

## 2020-04-11 DIAGNOSIS — J441 Chronic obstructive pulmonary disease with (acute) exacerbation: Secondary | ICD-10-CM | POA: Diagnosis present

## 2020-04-11 DIAGNOSIS — Z91013 Allergy to seafood: Secondary | ICD-10-CM | POA: Diagnosis not present

## 2020-04-11 DIAGNOSIS — R0600 Dyspnea, unspecified: Secondary | ICD-10-CM | POA: Diagnosis not present

## 2020-04-11 DIAGNOSIS — R7401 Elevation of levels of liver transaminase levels: Secondary | ICD-10-CM | POA: Insufficient documentation

## 2020-04-11 LAB — CBC WITH DIFFERENTIAL/PLATELET
Abs Immature Granulocytes: 0.06 10*3/uL (ref 0.00–0.07)
Basophils Absolute: 0 10*3/uL (ref 0.0–0.1)
Basophils Relative: 0 %
Eosinophils Absolute: 0 10*3/uL (ref 0.0–0.5)
Eosinophils Relative: 1 %
HCT: 46 % (ref 39.0–52.0)
Hemoglobin: 14.3 g/dL (ref 13.0–17.0)
Immature Granulocytes: 1 %
Lymphocytes Relative: 52 %
Lymphs Abs: 3.4 10*3/uL (ref 0.7–4.0)
MCH: 27.6 pg (ref 26.0–34.0)
MCHC: 31.1 g/dL (ref 30.0–36.0)
MCV: 88.6 fL (ref 80.0–100.0)
Monocytes Absolute: 0.8 10*3/uL (ref 0.1–1.0)
Monocytes Relative: 12 %
Neutro Abs: 2.2 10*3/uL (ref 1.7–7.7)
Neutrophils Relative %: 34 %
Platelets: 315 10*3/uL (ref 150–400)
RBC: 5.19 MIL/uL (ref 4.22–5.81)
RDW: 13.8 % (ref 11.5–15.5)
WBC: 6.5 10*3/uL (ref 4.0–10.5)
nRBC: 0 % (ref 0.0–0.2)

## 2020-04-11 LAB — TROPONIN I (HIGH SENSITIVITY)
Troponin I (High Sensitivity): 16 ng/L (ref <18)
Troponin I (High Sensitivity): 74 ng/L — ABNORMAL HIGH (ref <18)

## 2020-04-11 LAB — COMPREHENSIVE METABOLIC PANEL
ALT: 71 U/L — ABNORMAL HIGH (ref 0–44)
AST: 118 U/L — ABNORMAL HIGH (ref 15–41)
Albumin: 4 g/dL (ref 3.5–5.0)
Alkaline Phosphatase: 66 U/L (ref 38–126)
Anion gap: 11 (ref 5–15)
BUN: 13 mg/dL (ref 6–20)
CO2: 24 mmol/L (ref 22–32)
Calcium: 9.6 mg/dL (ref 8.9–10.3)
Chloride: 97 mmol/L — ABNORMAL LOW (ref 98–111)
Creatinine, Ser: 0.97 mg/dL (ref 0.61–1.24)
GFR, Estimated: >60 mL/min (ref >60)
Glucose, Bld: 198 mg/dL — ABNORMAL HIGH (ref 70–99)
Potassium: 5.2 mmol/L — ABNORMAL HIGH (ref 3.5–5.1)
Sodium: 132 mmol/L — ABNORMAL LOW (ref 135–145)
Total Bilirubin: 0.7 mg/dL (ref 0.3–1.2)
Total Protein: 7.1 g/dL (ref 6.5–8.1)

## 2020-04-11 LAB — RESP PANEL BY RT-PCR (FLU A&B, COVID) ARPGX2
Influenza A by PCR: NEGATIVE
Influenza B by PCR: NEGATIVE
SARS Coronavirus 2 by RT PCR: NEGATIVE

## 2020-04-11 LAB — GLUCOSE, CAPILLARY: Glucose-Capillary: 292 mg/dL — ABNORMAL HIGH (ref 70–99)

## 2020-04-11 MED ORDER — ONDANSETRON HCL 4 MG PO TABS: 4.0000 mg | ORAL_TABLET | Freq: Four times a day (QID) | ORAL | Status: DC | PRN

## 2020-04-11 MED ORDER — INSULIN ASPART 100 UNIT/ML ~~LOC~~ SOLN
0.0000 [IU] | Freq: Three times a day (TID) | SUBCUTANEOUS | Status: DC
  Administered 2020-04-12 (×2): 2 [IU] via SUBCUTANEOUS
  Administered 2020-04-13: 3 [IU] via SUBCUTANEOUS

## 2020-04-11 MED ORDER — PREDNISONE 20 MG PO TABS
40.0000 mg | ORAL_TABLET | Freq: Every day | ORAL | Status: DC
  Filled 2020-04-11: qty 2

## 2020-04-11 MED ORDER — ALBUTEROL SULFATE (2.5 MG/3ML) 0.083% IN NEBU
2.5000 mg | INHALATION_SOLUTION | RESPIRATORY_TRACT | Status: DC | PRN
  Administered 2020-04-11: 2.5 mg via RESPIRATORY_TRACT
  Filled 2020-04-11: qty 3

## 2020-04-11 MED ORDER — PANTOPRAZOLE SODIUM 40 MG PO TBEC
40.0000 mg | DELAYED_RELEASE_TABLET | Freq: Every day | ORAL | Status: DC
  Administered 2020-04-12 – 2020-04-13 (×2): 40 mg via ORAL
  Filled 2020-04-11 (×2): qty 1

## 2020-04-11 MED ORDER — UMECLIDINIUM BROMIDE 62.5 MCG/INH IN AEPB
1.0000 | INHALATION_SPRAY | Freq: Every day | RESPIRATORY_TRACT | Status: DC
  Filled 2020-04-11: qty 7

## 2020-04-11 MED ORDER — AMLODIPINE BESYLATE 10 MG PO TABS
10.0000 mg | ORAL_TABLET | Freq: Every day | ORAL | Status: DC
  Administered 2020-04-11 – 2020-04-13 (×3): 10 mg via ORAL
  Filled 2020-04-11 (×3): qty 1

## 2020-04-11 MED ORDER — ONDANSETRON HCL 4 MG/2ML IJ SOLN: 4.0000 mg | Freq: Four times a day (QID) | INTRAMUSCULAR | Status: DC | PRN

## 2020-04-11 MED ORDER — ALBUTEROL (5 MG/ML) CONTINUOUS INHALATION SOLN
10.0000 mg/h | INHALATION_SOLUTION | RESPIRATORY_TRACT | Status: DC
  Filled 2020-04-11: qty 20

## 2020-04-11 MED ORDER — MOMETASONE FURO-FORMOTEROL FUM 200-5 MCG/ACT IN AERO
2.0000 | INHALATION_SPRAY | Freq: Two times a day (BID) | RESPIRATORY_TRACT | Status: DC
  Administered 2020-04-12 – 2020-04-13 (×3): 2 via RESPIRATORY_TRACT
  Filled 2020-04-11: qty 8.8

## 2020-04-11 MED ORDER — ENOXAPARIN SODIUM 40 MG/0.4ML ~~LOC~~ SOLN
40.0000 mg | SUBCUTANEOUS | Status: DC
  Administered 2020-04-11 – 2020-04-12 (×2): 40 mg via SUBCUTANEOUS
  Filled 2020-04-11 (×2): qty 0.4

## 2020-04-11 MED ORDER — OXYCODONE HCL 5 MG PO TABS
15.0000 mg | ORAL_TABLET | Freq: Four times a day (QID) | ORAL | Status: DC | PRN
  Administered 2020-04-12 – 2020-04-13 (×6): 15 mg via ORAL
  Filled 2020-04-11 (×6): qty 3

## 2020-04-11 MED ORDER — METHYLPREDNISOLONE SODIUM SUCC 125 MG IJ SOLR
125.0000 mg | Freq: Once | INTRAMUSCULAR | Status: DC
  Filled 2020-04-11: qty 2

## 2020-04-11 MED ORDER — SODIUM CHLORIDE 0.9 % IV SOLN: 250.0000 mL | INTRAVENOUS | Status: DC | PRN

## 2020-04-11 MED ORDER — ALBUTEROL (5 MG/ML) CONTINUOUS INHALATION SOLN
INHALATION_SOLUTION | RESPIRATORY_TRACT | Status: AC
  Filled 2020-04-11: qty 20

## 2020-04-11 MED ORDER — SODIUM CHLORIDE 0.9 % IV SOLN
1.0000 g | INTRAVENOUS | Status: DC
  Administered 2020-04-12 (×2): 1 g via INTRAVENOUS
  Filled 2020-04-11 (×2): qty 10
  Filled 2020-04-11: qty 1
  Filled 2020-04-11: qty 10

## 2020-04-11 MED ORDER — SENNOSIDES-DOCUSATE SODIUM 8.6-50 MG PO TABS: 1.0000 | ORAL_TABLET | Freq: Every evening | ORAL | Status: DC | PRN

## 2020-04-11 MED ORDER — METHYLPREDNISOLONE SODIUM SUCC 125 MG IJ SOLR
125.0000 mg | Freq: Once | INTRAMUSCULAR | Status: AC
  Administered 2020-04-11: 125 mg via INTRAVENOUS

## 2020-04-11 MED ORDER — MAGNESIUM SULFATE 2 GM/50ML IV SOLN
2.0000 g | Freq: Once | INTRAVENOUS | Status: AC
  Administered 2020-04-11: 2 g via INTRAVENOUS
  Filled 2020-04-11: qty 50

## 2020-04-11 MED ORDER — METHYLPREDNISOLONE SODIUM SUCC 125 MG IJ SOLR
60.0000 mg | Freq: Four times a day (QID) | INTRAMUSCULAR | Status: DC
  Administered 2020-04-11 – 2020-04-12 (×2): 60 mg via INTRAVENOUS
  Filled 2020-04-11 (×3): qty 2

## 2020-04-11 MED ORDER — ALBUTEROL (5 MG/ML) CONTINUOUS INHALATION SOLN
INHALATION_SOLUTION | RESPIRATORY_TRACT | Status: AC
  Administered 2020-04-11: 10 mg/h via RESPIRATORY_TRACT
  Filled 2020-04-11: qty 20

## 2020-04-11 MED ORDER — IPRATROPIUM-ALBUTEROL 0.5-2.5 (3) MG/3ML IN SOLN
3.0000 mL | Freq: Four times a day (QID) | RESPIRATORY_TRACT | Status: DC
  Administered 2020-04-12 – 2020-04-13 (×7): 3 mL via RESPIRATORY_TRACT
  Filled 2020-04-11 (×7): qty 3

## 2020-04-11 NOTE — ED Notes (Signed)
Called RT to notify of order to wean BiPap

## 2020-04-11 NOTE — Progress Notes (Signed)
Pt taken off Bipap and placed on 5L [place]. SPO2 98% RR 16-20. No distress noted at this time. RT will continue to monitor

## 2020-04-11 NOTE — ED Notes (Signed)
Critical Result: Troponin 74, notified Horton DO

## 2020-04-11 NOTE — H&P (Signed)
History and Physical    Glenn [name] JXB:[id number] DOB: [birth year] DOA: 04/11/2020  PCP: Fleet ContrasAvbuere, Edwin, MD   Patient coming from: Home   Chief Complaint: SOB, wheezing   HPI: Glenn Oldlfred Mazzuca is a 60 y.o. male with medical history significant for COPD, asthma, bilateral lung surgery in 2011, and hypertension, now presenting to the emergency department with acute respiratory failure.  Patient reports that he quit smoking a few months ago, had been doing better for a while but has had increased shortness of breath and wheezing since moving somewhere where he suspects mold is exacerbating his chronic respiratory condition, has been using his nebulized breathing treatments more over the past few days, and then worsened acutely while at a restaurant tonight.  He denies any chest pain or leg swelling associated with this, denies any recent fevers or chills, and denies any rhinorrhea or sore throat.  He became acutely dyspneic while at a restaurant, had his nebulizer in his car, was trying to find some relief with that in the parking lot, and reportedly had a transient loss of consciousness and was ventilated with BVM by EMS, treated with DuoNeb and CPAP, and brought into the ED.  ED Course: Upon arrival to the ED, patient is found to be afebrile, saturating mid to upper 90s on BiPAP and then 5 L/min supplemental oxygen via nasal cannula.  EKG features sinus tachycardia with rate 115.  Chest x-ray negative for acute findings but notable for moderate left hemidiaphragm elevation.  Chemistry panel features a glucose 198, potassium 4.2, AST 119, and ALT 71.  CBC is unremarkable.  Initial troponin is 16 and then 74 on repeat.  COVID-19 PCR is negative.  Patient was treated with BiPAP initially, given 125 mg IV Solu-Medrol, 2 g IV magnesium, and continuous albuterol treatment.  Review of Systems:  All other systems reviewed and apart from HPI, are negative.  Past Medical History:  Diagnosis Date  . Arthritis   .  Asthma   . COPD (chronic obstructive pulmonary disease) (HCC)   . ED (erectile dysfunction)   . Hypertension     Past Surgical History:  Procedure Laterality Date  . LUNG SURGERY     removed tissue    Social History:   reports that he has been smoking cigarettes. He has a 17.00 pack-year smoking history. He has never used smokeless tobacco. He reports that he does not drink alcohol and does not use drugs.  Allergies  Allergen Reactions  . Ibuprofen     Abdominal sensitivity  . Shrimp [Shellfish Allergy] Itching  . Tylenol [Acetaminophen]     Abdominal sensitvity  . Lisinopril Rash    Tongue swelling    Family History  Problem Relation Age of Onset  . Hypertension Mother   . Diabetes Maternal Grandmother   . Colon cancer Neg Hx   . Esophageal cancer Neg Hx   . Rectal cancer Neg Hx   . Inflammatory bowel disease Neg Hx   . Liver disease Neg Hx   . Pancreatic cancer Neg Hx      Prior to Admission medications   Medication Sig Start Date End Date Taking? Authorizing Provider  albuterol (PROVENTIL HFA) 108 (90 Base) MCG/ACT inhaler Inhale 2 puffs into the lungs every 4 (four) hours as needed for wheezing or shortness of breath. 11/20/18   Nyoka CowdenWert, Michael B, MD  albuterol (PROVENTIL) (2.5 MG/3ML) 0.083% nebulizer solution PLEASE SEE ATTACHED FOR DETAILED DIRECTIONS Patient taking differently: Take 2.5 mg by nebulization every 6 (six)  hours as needed for wheezing or shortness of breath. 02/26/18   [name], MD  amLODipine (NORVASC) 10 MG tablet Take 10 mg by mouth daily.    [provider]  ASPIRIN LOW DOSE 81 MG EC tablet Take 81 mg by mouth daily. 04/29/19   [provider]  budesonide-formoterol (SYMBICORT) 160-4.5 MCG/ACT inhaler Inhale 2 puffs into the lungs 2 (two) times daily. 05/13/19   [name], MD  gabapentin (NEURONTIN) 300 MG capsule Take 1 capsule (300 mg total) by mouth 3 (three) times daily. 02/09/18   Eustace Moore, MD  metaxalone  (SKELAXIN) 800 MG tablet Take 1 tablet (800 mg total) by mouth 3 (three) times daily. 01/30/20   Rhys Martini, PA-C  nitroGLYCERIN (NITROSTAT) 0.4 MG SL tablet Place 0.4 mg under the tongue every 5 (five) minutes as needed for chest pain.    [provider]  pantoprazole (PROTONIX) 40 MG tablet Take 1 tablet (40 mg total) by mouth daily. 07/02/19   Elgergawy, Leana Roe, MD  predniSONE (DELTASONE) 10 MG tablet Take  4 each am x 2 days,   2 each am x 2 days,  1 each am x 2 days and stop 01/15/20   [name], MD  predniSONE (DELTASONE) 10 MG tablet Take 1 tablet (10 mg total) by mouth daily. 01/30/20   Rhys Martini, PA-C  sildenafil (VIAGRA) 100 MG tablet Take 100 mg by mouth daily as needed for erectile dysfunction.    [provider]  tamsulosin (FLOMAX) 0.4 MG CAPS capsule Take 0.4 mg by mouth every evening. 06/26/19   [provider]  Tiotropium Bromide Monohydrate (SPIRIVA RESPIMAT) 2.5 MCG/ACT AERS 2 puffs each am 01/15/20   [name], MD  Vitamin D, Ergocalciferol, (DRISDOL) 1.25 MG (50000 UNIT) CAPS capsule Take 50,000 Units by mouth once a week. Mondays 05/02/19   [provider]    Physical Exam: Vitals:   04/11/20 2000 04/11/20 2015 04/11/20 2045 04/11/20 2055  BP: (!) 144/100 (!) 144/102 (!) 160/98   Pulse: 100 (!) 103 (!) 104   Resp: (!) 24 13 (!) 26   Temp:    97.8 F (36.6 C)  TempSrc:    Oral  SpO2: 98% 98% 99%   Weight:      Height:        Constitutional: NAD, calm  Eyes: PERTLA, lids and conjunctivae normal ENMT: Mucous membranes are moist. Posterior pharynx clear of any exudate or lesions.   Neck: normal, supple, no masses, no thyromegaly Respiratory: Tachypneic, speaking full sentences, diminished bilaterally with prolonged expiratory phase. No pallor or cyanosis.  Cardiovascular: S1 & S2 heard, regular rate and rhythm. No extremity edema.  Abdomen: No distension, no tenderness, soft. Bowel sounds active.   Musculoskeletal: no clubbing / cyanosis. No joint deformity upper and lower extremities.   Skin: no significant rashes, lesions, ulcers. Warm, dry, well-perfused. Neurologic: CN 2-12 grossly intact. Sensation intact. Moving all extremities.  Psychiatric: Alert and oriented to person, place, and situation. Calm and cooperative.    Labs and Imaging on Admission: I have personally reviewed following labs and imaging studies  CBC: Recent Labs  Lab 04/11/20 1619  WBC 6.5  NEUTROABS 2.2  HGB 14.3  HCT 46.0  MCV 88.6  PLT 315   Basic Metabolic Panel: Recent Labs  Lab 04/11/20 1619  NA 132*  K 5.2*  CL 97*  CO2 24  GLUCOSE 198*  BUN 13  CREATININE 0.97  CALCIUM 9.6  GFR: Estimated Creatinine Clearance: 70.4 mL/min (by C-G formula based on SCr of 0.97 mg/dL). Liver Function Tests: Recent Labs  Lab 04/11/20 1619  AST 118*  ALT 71*  ALKPHOS 66  BILITOT 0.7  PROT 7.1  ALBUMIN 4.0   No results for input(s): LIPASE, AMYLASE in the last 168 hours. No results for input(s): AMMONIA in the last 168 hours. Coagulation Profile: No results for input(s): INR, PROTIME in the last 168 hours. Cardiac Enzymes: No results for input(s): CKTOTAL, CKMB, CKMBINDEX, TROPONINI in the last 168 hours. BNP (last 3 results) No results for input(s): PROBNP in the last 8760 hours. HbA1C: No results for input(s): HGBA1C in the last 72 hours. CBG: No results for input(s): GLUCAP in the last 168 hours. Lipid Profile: No results for input(s): CHOL, HDL, LDLCALC, TRIG, CHOLHDL, LDLDIRECT in the last 72 hours. Thyroid Function Tests: No results for input(s): TSH, T4TOTAL, FREET4, T3FREE, THYROIDAB in the last 72 hours. Anemia Panel: No results for input(s): VITAMINB12, FOLATE, FERRITIN, TIBC, IRON, RETICCTPCT in the last 72 hours. Urine analysis: No results found for: COLORURINE, APPEARANCEUR, LABSPEC, PHURINE, GLUCOSEU, HGBUR, BILIRUBINUR, KETONESUR, PROTEINUR, UROBILINOGEN, NITRITE,  LEUKOCYTESUR Sepsis Labs: @LABRCNTIP (procalcitonin:4,lacticidven:4) ) Recent Results (from the past 240 hour(s))  Resp Panel by RT-PCR (Flu A&B, Covid) Nasopharyngeal Swab     Status: None   Collection Time: 04/11/20  3:52 PM   Specimen: Nasopharyngeal Swab; Nasopharyngeal(NP) swabs in vial transport medium  Result Value Ref Range Status   SARS Coronavirus 2 by RT PCR NEGATIVE NEGATIVE Final    Comment: (NOTE) SARS-CoV-2 target nucleic acids are NOT DETECTED.  The SARS-CoV-2 RNA is generally detectable in upper respiratory specimens during the acute phase of infection. The lowest concentration of SARS-CoV-2 viral copies this assay can detect is 138 copies/mL. A negative result does not preclude SARS-Cov-2 infection and should not be used as the sole basis for treatment or other patient management decisions. A negative result may occur with  improper specimen collection/handling, submission of specimen other than nasopharyngeal swab, presence of viral mutation(s) within the areas targeted by this assay, and inadequate number of viral copies(<138 copies/mL). A negative result must be combined with clinical observations, patient history, and epidemiological information. The expected result is Negative.  Fact Sheet for Patients:  06/11/20  Fact Sheet for Healthcare Providers:  BloggerCourse.com  This test is no t yet approved or cleared by the [employer] FDA and  has been authorized for detection and/or diagnosis of SARS-CoV-2 by FDA under an Emergency Use Authorization (EUA). This EUA will remain  in effect (meaning this test can be used) for the duration of the COVID-19 declaration under Section 564(b)(1) of the Act, 21 U.S.C.section 360bbb-3(b)(1), unless the authorization is terminated  or revoked sooner.       Influenza A by PCR NEGATIVE NEGATIVE Final   Influenza B by PCR NEGATIVE NEGATIVE Final    Comment:  (NOTE) The Xpert Xpress SARS-CoV-2/FLU/RSV plus assay is intended as an aid in the diagnosis of influenza from Nasopharyngeal swab specimens and should not be used as a sole basis for treatment. Nasal washings and aspirates are unacceptable for Xpert Xpress SARS-CoV-2/FLU/RSV testing.  Fact Sheet for Patients: [place]  Fact Sheet for Healthcare Providers: BloggerCourse.com  This test is not yet approved or cleared by the [employer] FDA and has been authorized for detection and/or diagnosis of SARS-CoV-2 by FDA under an Emergency Use Authorization (EUA). This EUA will remain in effect (meaning this test can be used) for the duration  of the COVID-19 declaration under Section 564(b)(1) of the Act, 21 U.S.C. section 360bbb-3(b)(1), unless the authorization is terminated or revoked.  Performed at [facility], [address], [place], [place] [address]      Radiological Exams on Admission: DG Chest Port 1 View  Result Date: 04/11/2020 CLINICAL DATA:  Shortness of breath.  Respiratory arrest EXAM: PORTABLE CHEST 1 VIEW COMPARISON:  06/29/2019 FINDINGS: Patient rotated right. Numerous leads and wires project over the chest. Normal heart size. Moderate left hemidiaphragm elevation. No pleural effusion or pneumothorax. Lung volumes are low. This accentuates the pulmonary interstitium. Volume loss in the right lung base. Surgical changes in the left hilum and likely the left lung base. No well-defined lobar consolidation. Volume loss adjacent to the elevated left hemidiaphragm. IMPRESSION: No acute cardiopulmonary disease. Moderate left hemidiaphragm elevation with adjacent volume loss. Low lung volumes throughout with resultant pulmonary interstitial prominence. Electronically Signed   By: Jeronimo Greaves M.D.   On: 04/11/2020 16:25    EKG: Independently reviewed. Sinus tachycardia, rate 115.   Assessment/Plan   1. COPD with  acute exacerbation; acute hypoxic respiratory failure  - Patient with COPD with several days of increased SOB and wheezing worsened acutely tonight, reports transient LOC with EMS, on BiPAP initially, but has made marked improvement and no alert and speaking full sentences on 5 Lpm via nasal canula  - Check sputum culture, continue systemic steroids, start antibiotic, continue Spiriva and ICS/LABA, continue duonebs, continue to wean supplemental O2 as tolerated    2. Elevated troponin  - HS troponin 16 then 74 without chest pain or acute ischemic changes on EKG  - Likely related to acute respiratory failure, less-likely PE, low-suspicion for ACS  - Continue cardiac monitoring, trend troponin, check d-dimer and CTA if elevated   3. Hyperglycemia  - Serum glucose is 198 on admission without hx of DM  - Likely from steroids  - Check A1c, monitor CBGs, use low-intensity SSI if needed    4. Hypertension  - Continue Norvasc    5. Elevated transaminases  - AST is 118 and ALT 71 on admission, previously normal  - Abd exam benign  - Check for chronic viral hepatitis, trend    DVT prophylaxis: Lovenox  Code Status: Full  Level of Care: Level of care: Progressive Family Communication: Sister updated at bedside at patient request  Disposition Plan:  Patient is from: Home  Anticipated d/c is to: Home  Anticipated d/c date is: 04/14/20 Patient currently: Has new supplemental O2 requirement, dyspneic at rest  Consults called: None  Admission status: Inpatient     Briscoe Deutscher, MD [facility]  04/11/2020, 9:12 PM

## 2020-04-11 NOTE — ED Triage Notes (Signed)
Pt arrived to ED via EMS as a respiratory arrest. EMS reports pt was found unresponsive w/ agonal respirations supine in the [employer] parking lot. Per witnesses pt couldn't breath and went outside to take a breathing treatment and collapsed. EMS bagged pt for 15 mins and gave duoneb. Pt became alert and was able to then tolerate Cpap. Lung sounds silent. Initial ETCO2 90. VS HR 131-170, BP 240/palp. EMS IV 20 L FA

## 2020-04-11 NOTE — Progress Notes (Signed)
Patient arrived to the floor, alert and oriented on 5l O2. Stand up scaled obtained, CHG bath applied, and cardiac monitoring initiated. Patient oriented to staff, room and equipment we'll continue to monitor.

## 2020-04-11 NOTE — ED Provider Notes (Signed)
[facility] EMERGENCY DEPARTMENT Provider Note   CSN: [id number] Arrival date & time: 04/11/20  1547     History Chief Complaint  Patient presents with  . Respiratory Arrest    Jamaal Bernasconi is a 60 y.o. male.  HPI   60 year old male with past medical history of HTN, COPD presents the emergency department in respiratory distress.  Patient was reportedly at [employer] when he told witnesses that he could not breathe, he ran outside to try to use a breathing treatment.  When EMS arrived they said that he had "agonal" breaths.  They bagged him for 15 minutes while giving a DuoNeb and patient became alert.  Had a pulse the entire time, no CPR.  Arrives on CPAP.  History limited secondary to acuity of presentation.  Past Medical History:  Diagnosis Date  . Arthritis   . Asthma   . COPD (chronic obstructive pulmonary disease) (HCC)   . ED (erectile dysfunction)   . Hypertension     Patient Active Problem List   Diagnosis Date Noted  . COPD with acute exacerbation (HCC) 06/30/2019  . Acute respiratory failure with hypoxemia (HCC) 06/30/2019  . Hypotension 06/30/2019  . Near syncope 06/30/2019  . Pleural nodule 06/30/2019  . Colon cancer screening 12/18/2017  . History of colonic polyps 12/18/2017  . Cigarette smoker 11/24/2017  . 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 COPD GOLD III/ actively smoking  10/05/2016    Past Surgical History:  Procedure Laterality Date  . LUNG SURGERY     removed tissue       Family History  Problem Relation Age of Onset  . Hypertension Mother   . Diabetes Maternal Grandmother   . Colon cancer Neg Hx   . Esophageal cancer Neg Hx   . Rectal cancer Neg Hx   . Inflammatory bowel disease Neg Hx   . Liver disease Neg Hx   . Pancreatic cancer Neg Hx     Social History   Tobacco Use  . Smoking status: Current Every Day Smoker    Packs/day: 0.50    Years: 34.00    Pack years: 17.00    Types: Cigarettes  . Smokeless  tobacco: Never Used  Vaping Use  . Vaping Use: Never used  Substance Use Topics  . Alcohol use: Never  . Drug use: Never    Home Medications Prior to Admission medications   Medication Sig Start Date End Date Taking? Authorizing Provider  albuterol (PROVENTIL HFA) 108 (90 Base) MCG/ACT inhaler Inhale 2 puffs into the lungs every 4 (four) hours as needed for wheezing or shortness of breath. 11/20/18   [name], MD  albuterol (PROVENTIL) (2.5 MG/3ML) 0.083% nebulizer solution PLEASE SEE ATTACHED FOR DETAILED DIRECTIONS Patient taking differently: Take 2.5 mg by nebulization every 6 (six) hours as needed for wheezing or shortness of breath. 02/26/18   [name], MD  amLODipine (NORVASC) 10 MG tablet Take 10 mg by mouth daily.    [provider]  ASPIRIN LOW DOSE 81 MG EC tablet Take 81 mg by mouth daily. 04/29/19   [provider]  budesonide-formoterol (SYMBICORT) 160-4.5 MCG/ACT inhaler Inhale 2 puffs into the lungs 2 (two) times daily. 05/13/19   [name], MD  gabapentin (NEURONTIN) 300 MG capsule Take 1 capsule (300 mg total) by mouth 3 (three) times daily. 02/09/18   Eustace Moore, MD  metaxalone (SKELAXIN) 800 MG tablet Take 1 tablet (800 mg total) by mouth 3 (three) times  daily. 01/30/20   Rhys Martini, PA-C  nitroGLYCERIN (NITROSTAT) 0.4 MG SL tablet Place 0.4 mg under the tongue every 5 (five) minutes as needed for chest pain.    [provider]  pantoprazole (PROTONIX) 40 MG tablet Take 1 tablet (40 mg total) by mouth daily. 07/02/19   Elgergawy, Leana Roe, MD  predniSONE (DELTASONE) 10 MG tablet Take  4 each am x 2 days,   2 each am x 2 days,  1 each am x 2 days and stop 01/15/20   [name], MD  predniSONE (DELTASONE) 10 MG tablet Take 1 tablet (10 mg total) by mouth daily. 01/30/20   Rhys Martini, PA-C  sildenafil (VIAGRA) 100 MG tablet Take 100 mg by mouth daily as needed for erectile dysfunction.    [provider]   tamsulosin (FLOMAX) 0.4 MG CAPS capsule Take 0.4 mg by mouth every evening. 06/26/19   [provider]  Tiotropium Bromide Monohydrate (SPIRIVA RESPIMAT) 2.5 MCG/ACT AERS 2 puffs each am 01/15/20   [name], MD  Vitamin D, Ergocalciferol, (DRISDOL) 1.25 MG (50000 UNIT) CAPS capsule Take 50,000 Units by mouth once a week. Mondays 05/02/19   [provider]    Allergies    Ibuprofen, Shrimp [shellfish allergy], Tylenol [acetaminophen], and Lisinopril  Review of Systems   Review of Systems  Unable to perform ROS: Acuity of condition    Physical Exam Updated Vital Signs BP (!) 175/114 (BP Location: Right Arm)   Pulse (!) 133   Temp (!) 97.4 F (36.3 C) (Axillary)   Resp 18   Ht 5\' 5"  (1.651 m)   Wt 65.3 kg   SpO2 98%   BMI 23.96 kg/m   Physical Exam Vitals and nursing note reviewed.  Constitutional:      General: He is in acute distress.     Appearance: He is ill-appearing, toxic-appearing and diaphoretic.  HENT:     Head: Normocephalic.     Mouth/Throat:     Mouth: Mucous membranes are dry.  Eyes:     Pupils: Pupils are equal, round, and reactive to light.  Cardiovascular:     Rate and Rhythm: Tachycardia present.  Pulmonary:     Effort: Respiratory distress present.     Comments: Significant respiratory distress, sweaty, severely diminished breath sounds bilaterally with scattered wheezes, patient is breathless, tachypneic and belly breathing Abdominal:     Palpations: Abdomen is soft.  Musculoskeletal:        General: No swelling.  Skin:    General: Skin is warm.  Neurological:     Mental Status: He is alert.     Comments: Answers to name and follows commands     ED Results / Procedures / Treatments   Labs (all labs ordered are listed, but only abnormal results are displayed) Labs Reviewed  RESP PANEL BY RT-PCR (FLU A&B, COVID) [id number]    EKG None  Radiology No results found.  Procedures .Critical Care Performed by: , DO Authorized by: Rozelle Logan, DO   Critical care provider statement:    Critical care time (minutes):  40   Critical care was time spent personally by me on the following activities:  Discussions with consultants, evaluation of patient's response to treatment, examination of patient, ordering and performing treatments and interventions, ordering and review of laboratory studies, ordering and review of radiographic studies, pulse oximetry, re-evaluation of patient's condition, obtaining history from patient or surrogate and review of old charts  Medications Ordered in ED Medications  albuterol (PROVENTIL,VENTOLIN) solution continuous neb (has no administration in time range)  albuterol (VENTOLIN) (5 MG/ML) 0.5% continuous inhalation solution (has no administration in time range)  magnesium sulfate IVPB 2 g 50 mL (2 g Intravenous New Bag/Given 04/11/20 1602)  albuterol (VENTOLIN) (5 MG/ML) 0.5% continuous inhalation solution (has no administration in time range)  methylPREDNISolone sodium succinate (SOLU-MEDROL) 125 mg/2 mL injection 125 mg (125 mg Intravenous Not Given 04/11/20 1603)  methylPREDNISolone sodium succinate (SOLU-MEDROL) 125 mg/2 mL injection 125 mg (125 mg Intravenous Given 04/11/20 1551)    ED Course  I have reviewed the triage vital signs and the nursing notes.  Pertinent labs & imaging results that were available during my care of the patient were reviewed by me and considered in my medical decision making (see chart for details).    MDM Rules/Calculators/A&P                          60 year old male arrives in respiratory distress.  History of COPD.  Report is that patient felt like he could not breathe, tried a breathing treatment and became unresponsive.  Bagged by EMS, arrived on CPAP.  Got 1 DuoNeb in route.  No IV access.  On arrival we established IV access, gave the patient Solu-Medrol, started magnesium.  Patient was switched to BiPAP with  continuous albuterol.  Patient is tachycardic, hypertensive, oxygenation improved on BiPAP to 100%.  Patient continues to have increased work of breathing, on BiPAP lung auscultation is significantly improved with increased aeration and diffuse wheezing.  Patient's blood work appears baseline, first troponin is negative, second troponin is uptrending, most likely demand, no acute changes on the EKG.  Patient continued to improve, we were able to wean him off the BiPAP to nasal cannula.  He feels improved but he still has bilateral wheezing.  Plan for admission for COPD exacerbation.  Patients evaluation and results requires admission for further treatment and care. Patient agrees with admission plan, offers no new complaints and is stable/unchanged at time of admit.   Final Clinical Impression(s) / ED Diagnoses Final diagnoses:  None    Rx / DC Orders ED Discharge Orders    None       Rozelle Logan, DO 04/11/20 2052

## 2020-04-12 ENCOUNTER — Other Ambulatory Visit (HOSPITAL_COMMUNITY): Payer: Medicaid Other

## 2020-04-12 DIAGNOSIS — J441 Chronic obstructive pulmonary disease with (acute) exacerbation: Secondary | ICD-10-CM | POA: Diagnosis not present

## 2020-04-12 LAB — BASIC METABOLIC PANEL
Anion gap: 10 (ref 5–15)
BUN: 17 mg/dL (ref 6–20)
CO2: 27 mmol/L (ref 22–32)
Calcium: 9.3 mg/dL (ref 8.9–10.3)
Chloride: 97 mmol/L — ABNORMAL LOW (ref 98–111)
Creatinine, Ser: 0.94 mg/dL (ref 0.61–1.24)
GFR, Estimated: >60 mL/min (ref >60)
Glucose, Bld: 251 mg/dL — ABNORMAL HIGH (ref 70–99)
Potassium: 4.8 mmol/L (ref 3.5–5.1)
Sodium: 134 mmol/L — ABNORMAL LOW (ref 135–145)

## 2020-04-12 LAB — HEPATITIS PANEL, ACUTE
HCV Ab: NONREACTIVE
Hep A IgM: NONREACTIVE
Hep B C IgM: NONREACTIVE
Hepatitis B Surface Ag: NONREACTIVE

## 2020-04-12 LAB — GLUCOSE, CAPILLARY
Glucose-Capillary: 186 mg/dL — ABNORMAL HIGH (ref 70–99)
Glucose-Capillary: 234 mg/dL — ABNORMAL HIGH (ref 70–99)
Glucose-Capillary: 169 mg/dL — ABNORMAL HIGH (ref 70–99)
Glucose-Capillary: 214 mg/dL — ABNORMAL HIGH (ref 70–99)
Glucose-Capillary: 243 mg/dL — ABNORMAL HIGH (ref 70–99)

## 2020-04-12 LAB — CBC
HCT: 41.7 % (ref 39.0–52.0)
Hemoglobin: 13.4 g/dL (ref 13.0–17.0)
MCH: 28 pg (ref 26.0–34.0)
MCHC: 32.1 g/dL (ref 30.0–36.0)
MCV: 87.1 fL (ref 80.0–100.0)
Platelets: 278 10*3/uL (ref 150–400)
RBC: 4.79 MIL/uL (ref 4.22–5.81)
RDW: 13.9 % (ref 11.5–15.5)
WBC: 11.1 10*3/uL — ABNORMAL HIGH (ref 4.0–10.5)
nRBC: 0 % (ref 0.0–0.2)

## 2020-04-12 LAB — HEPATIC FUNCTION PANEL
ALT: 64 U/L — ABNORMAL HIGH (ref 0–44)
AST: 59 U/L — ABNORMAL HIGH (ref 15–41)
Albumin: 3.5 g/dL (ref 3.5–5.0)
Alkaline Phosphatase: 58 U/L (ref 38–126)
Bilirubin, Direct: <0.1 mg/dL (ref 0.0–0.2)
Total Bilirubin: 0.6 mg/dL (ref 0.3–1.2)
Total Protein: 6.4 g/dL — ABNORMAL LOW (ref 6.5–8.1)

## 2020-04-12 LAB — HEMOGLOBIN A1C
Hgb A1c MFr Bld: 6.6 % — ABNORMAL HIGH (ref 4.8–5.6)
Mean Plasma Glucose: 142.72 mg/dL

## 2020-04-12 LAB — D-DIMER, QUANTITATIVE: D-Dimer, Quant: 1.4 ug/mL-FEU — ABNORMAL HIGH (ref 0.00–0.50)

## 2020-04-12 LAB — TROPONIN I (HIGH SENSITIVITY): Troponin I (High Sensitivity): 78 ng/L — ABNORMAL HIGH (ref <18)

## 2020-04-12 MED ORDER — METHYLPREDNISOLONE SODIUM SUCC 125 MG IJ SOLR
60.0000 mg | Freq: Four times a day (QID) | INTRAMUSCULAR | Status: AC
  Administered 2020-04-12 (×2): 60 mg via INTRAVENOUS
  Filled 2020-04-12: qty 2

## 2020-04-12 MED ORDER — GABAPENTIN 600 MG PO TABS
300.0000 mg | ORAL_TABLET | Freq: Three times a day (TID) | ORAL | Status: DC
  Administered 2020-04-12 – 2020-04-13 (×3): 300 mg via ORAL
  Filled 2020-04-12 (×3): qty 1

## 2020-04-12 MED ORDER — PREDNISONE 20 MG PO TABS
40.0000 mg | ORAL_TABLET | Freq: Every day | ORAL | Status: DC
  Administered 2020-04-13: 40 mg via ORAL
  Filled 2020-04-12: qty 2

## 2020-04-12 NOTE — Plan of Care (Signed)
Progressing, will continue to monitor.  

## 2020-04-12 NOTE — Progress Notes (Signed)
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 -Yesterday was at check H he is became acutely dyspneic went to his car to get the nebulizer and subsequently had transient loss of consciousness and brought to the emergency room. -In the ED he was mildly tachycardic, hypoxic with sats in the high 80s to low  90s , Chest x-ray noted left diaphragm elevation, high-sensitivity troponin was 74, Covid PCR was negative he was placed on BiPAP steroids and nebs  Assessment & Plan:   Acute hypoxic respiratory failure COPD with acute exacerbation -Off BiPAP now -Very poor air movement with scattered wheezes -Continue IV Solu-Medrol today, ceftriaxone, duo nebs -Wean O2 -Check ambulatory O2 sats  Mildly elevated troponin -Suspect this is secondary to demand, no symptoms of ACS, EKG without acute ST-T wave changes -Check 2D echocardiogram  Hyperglycemia, New DM -Secondary to steroids, hemoglobin A1c is 6.6  Hypertension -Continue Norvasc  Elevated transaminases -Follow-up viral hepatitis panel  DVT prophylaxis: Lovenox Code Status: Full code Family Communication: Wife at bedside Disposition Plan:  Status is: Inpatient  Remains inpatient appropriate because:Inpatient level of care appropriate due to severity of illness   Dispo: The patient is from: Home              Anticipated d/c is to: Home              Patient currently is not medically stable to d/c.   Difficult to place patient No   Consultants:    Procedures:   Antimicrobials:    Subjective: -Feeling better today, not back to baseline yet, less cough and wheezing Objective: Vitals:   04/12/20 0351 04/12/20 0700 04/12/20 0752 04/12/20 0823  BP: (!)  144/86  137/89   Pulse: 100 96 100   Resp: 20   20  Temp: 98 F (36.7 C)  97.7 F (36.5 C)   TempSrc: Oral  Oral   SpO2: 97%  97%   Weight:      Height:        Intake/Output Summary (Last 24 hours) at 04/12/2020 1031 Last data filed at 04/12/2020 0900 Gross per 24 hour  Intake 378.07 ml  Output 400 ml  Net -21.93 ml   Filed Weights   04/11/20 1557 04/11/20 2219  Weight: 65.3 kg 62.4 kg    Examination:  General exam: Pleasant thinly built male sitting up in bed, AAOx3, no distress CVS: S1-S2, regular rate rhythm lungs: Poor air movement bilaterally, few scattered wheezes abdomen: Soft, nontender, bowel sounds present extremities: No edema skin: No rashes on exposed skin Psychiatry:. Mood & affect appropriate.     Data Reviewed:   CBC: Recent Labs  Lab 04/11/20 1619 04/12/20 0103  WBC 6.5 11.1*  NEUTROABS 2.2  --   HGB 14.3 13.4  HCT 46.0 41.7  MCV 88.6 87.1  PLT 315 278   Basic Metabolic Panel: Recent Labs  Lab 04/11/20 1619 04/12/20 0103  NA 132* 134*  K 5.2* 4.8  CL 97* 97*  CO2 24 27  GLUCOSE 198* 251*  BUN 13 17  CREATININE 0.97 0.94  CALCIUM 9.6 9.3   GFR: Estimated Creatinine Clearance: 72.7 mL/min (by C-G formula based on SCr of 0.94 mg/dL). Liver Function Tests: Recent Labs  Lab  04/11/20 1619 04/12/20 0103  AST 118* 59*  ALT 71* 64*  ALKPHOS 66 58  BILITOT 0.7 0.6  PROT 7.1 6.4*  ALBUMIN 4.0 3.5   No results for input(s): LIPASE, AMYLASE in the last 168 hours. No results for input(s): AMMONIA in the last 168 hours. Coagulation Profile: No results for input(s): INR, PROTIME in the last 168 hours. Cardiac Enzymes: No results for input(s): CKTOTAL, CKMB, CKMBINDEX, TROPONINI in the last 168 hours. BNP (last 3 results) No results for input(s): PROBNP in the last 8760 hours. HbA1C: Recent Labs    04/12/20 0103  HGBA1C 6.6*   CBG: Recent Labs  Lab 04/11/20 2239 04/12/20 0612 04/12/20 0839  GLUCAP 292* 186* 234*   Lipid  Profile: No results for input(s): CHOL, HDL, LDLCALC, TRIG, CHOLHDL, LDLDIRECT in the last 72 hours. Thyroid Function Tests: No results for input(s): TSH, T4TOTAL, FREET4, T3FREE, THYROIDAB in the last 72 hours. Anemia Panel: No results for input(s): VITAMINB12, FOLATE, FERRITIN, TIBC, IRON, RETICCTPCT in the last 72 hours. Urine analysis: No results found for: COLORURINE, APPEARANCEUR, LABSPEC, PHURINE, GLUCOSEU, HGBUR, BILIRUBINUR, KETONESUR, PROTEINUR, UROBILINOGEN, NITRITE, LEUKOCYTESUR Sepsis Labs: @LABRCNTIP (procalcitonin:4,lacticidven:4)  ) Recent Results (from the past 240 hour(s))  Resp Panel by RT-PCR (Flu A&B, Covid) Nasopharyngeal Swab     Status: None   Collection Time: 04/11/20  3:52 PM   Specimen: Nasopharyngeal Swab; Nasopharyngeal(NP) swabs in vial transport medium  Result Value Ref Range Status   SARS Coronavirus 2 by RT PCR NEGATIVE NEGATIVE Final    Comment: (NOTE) SARS-CoV-2 target nucleic acids are NOT DETECTED.  The SARS-CoV-2 RNA is generally detectable in upper respiratory specimens during the acute phase of infection. The lowest concentration of SARS-CoV-2 viral copies this assay can detect is 138 copies/mL. A negative result does not preclude SARS-Cov-2 infection and should not be used as the sole basis for treatment or other patient management decisions. A negative result may occur with  improper specimen collection/handling, submission of specimen other than nasopharyngeal swab, presence of viral mutation(s) within the areas targeted by this assay, and inadequate number of viral copies(<138 copies/mL). A negative result must be combined with clinical observations, patient history, and epidemiological information. The expected result is Negative.  Fact Sheet for Patients:  06/11/20  Fact Sheet for Healthcare Providers:  BloggerCourse.com  This test is no t yet approved or cleared by the [employer] FDA and  has been authorized for detection and/or diagnosis of SARS-CoV-2 by FDA under an Emergency Use Authorization (EUA). This EUA will remain  in effect (meaning this test can be used) for the duration of the COVID-19 declaration under Section 564(b)(1) of the Act, 21 U.S.C.section 360bbb-3(b)(1), unless the authorization is terminated  or revoked sooner.       Influenza A by PCR NEGATIVE NEGATIVE Final   Influenza B by PCR NEGATIVE NEGATIVE Final    Comment: (NOTE) The Xpert Xpress SARS-CoV-2/FLU/RSV plus assay is intended as an aid in the diagnosis of influenza from Nasopharyngeal swab specimens and should not be used as a sole basis for treatment. Nasal washings and aspirates are unacceptable for Xpert Xpress SARS-CoV-2/FLU/RSV testing.  Fact Sheet for Patients: [place]  Fact Sheet for Healthcare Providers: BloggerCourse.com  This test is not yet approved or cleared by the [employer].[place] FDA and has been authorized for detection and/or diagnosis of SARS-CoV-2 by FDA under an Emergency Use Authorization (EUA). This EUA will remain in effect (meaning this test can be used) for the duration of the COVID-19 declaration  under Section 564(b)(1) of the Act, 21 U.S.C. section 360bbb-3(b)(1), unless the authorization is terminated or revoked.  Performed at [facility], [address], [place], [place] [address]          Radiology Studies: DG Chest Port 1 View  Result Date: 04/11/2020 CLINICAL DATA:  Shortness of breath.  Respiratory arrest EXAM: PORTABLE CHEST 1 VIEW COMPARISON:  06/29/2019 FINDINGS: Patient rotated right. Numerous leads and wires project over the chest. Normal heart size. Moderate left hemidiaphragm elevation. No pleural effusion or pneumothorax. Lung volumes are low. This accentuates the pulmonary interstitium. Volume loss in the right lung base. Surgical changes in the left hilum and  likely the left lung base. No well-defined lobar consolidation. Volume loss adjacent to the elevated left hemidiaphragm. IMPRESSION: No acute cardiopulmonary disease. Moderate left hemidiaphragm elevation with adjacent volume loss. Low lung volumes throughout with resultant pulmonary interstitial prominence. Electronically Signed   By: Jeronimo Greaves M.D.   On: 04/11/2020 16:25        Scheduled Meds: . amLODipine  10 mg Oral Daily  . enoxaparin (LOVENOX) injection  40 mg Subcutaneous Q24H  . insulin aspart  0-6 Units Subcutaneous TID WC  . ipratropium-albuterol  3 mL Nebulization Q6H  . methylPREDNISolone (SOLU-MEDROL) injection  60 mg Intravenous Q6H   Followed by  . [START ON 04/13/2020] predniSONE  40 mg Oral Q breakfast  . mometasone-formoterol  2 puff Inhalation BID  . pantoprazole  40 mg Oral Daily   Continuous Infusions: . sodium chloride    . albuterol 10 mg/hr (04/11/20 1609)  . cefTRIAXone (ROCEPHIN)  IV 1 g (04/12/20 0037)     LOS: 1 day    Time spent:    Zannie Cove, MD [facility]  04/12/2020, 10:31 AM

## 2020-04-12 NOTE — Plan of Care (Signed)
POC initiated and progressing. 

## 2020-04-13 ENCOUNTER — Inpatient Hospital Stay (HOSPITAL_COMMUNITY): Payer: Medicaid Other

## 2020-04-13 DIAGNOSIS — R0600 Dyspnea, unspecified: Secondary | ICD-10-CM

## 2020-04-13 DIAGNOSIS — R7989 Other specified abnormal findings of blood chemistry: Secondary | ICD-10-CM

## 2020-04-13 DIAGNOSIS — J441 Chronic obstructive pulmonary disease with (acute) exacerbation: Secondary | ICD-10-CM | POA: Diagnosis not present

## 2020-04-13 LAB — HEPATIC FUNCTION PANEL
ALT: 44 U/L (ref 0–44)
AST: 25 U/L (ref 15–41)
Albumin: 3.3 g/dL — ABNORMAL LOW (ref 3.5–5.0)
Alkaline Phosphatase: 49 U/L (ref 38–126)
Bilirubin, Direct: <0.1 mg/dL (ref 0.0–0.2)
Total Bilirubin: 0.3 mg/dL (ref 0.3–1.2)
Total Protein: 5.8 g/dL — ABNORMAL LOW (ref 6.5–8.1)

## 2020-04-13 LAB — CBC
HCT: 37.5 % — ABNORMAL LOW (ref 39.0–52.0)
Hemoglobin: 12.5 g/dL — ABNORMAL LOW (ref 13.0–17.0)
MCH: 29.1 pg (ref 26.0–34.0)
MCHC: 33.3 g/dL (ref 30.0–36.0)
MCV: 87.4 fL (ref 80.0–100.0)
Platelets: 272 10*3/uL (ref 150–400)
RBC: 4.29 MIL/uL (ref 4.22–5.81)
RDW: 14.3 % (ref 11.5–15.5)
WBC: 18.1 10*3/uL — ABNORMAL HIGH (ref 4.0–10.5)
nRBC: 0 % (ref 0.0–0.2)

## 2020-04-13 LAB — GLUCOSE, CAPILLARY
Glucose-Capillary: 136 mg/dL — ABNORMAL HIGH (ref 70–99)
Glucose-Capillary: 281 mg/dL — ABNORMAL HIGH (ref 70–99)

## 2020-04-13 LAB — ECHOCARDIOGRAM COMPLETE
Area-P 1/2: 5.13 cm2
Height: 65 in
S' Lateral: 3 cm
Weight: 2200 oz

## 2020-04-13 LAB — BASIC METABOLIC PANEL
Anion gap: 11 (ref 5–15)
BUN: 20 mg/dL (ref 6–20)
CO2: 23 mmol/L (ref 22–32)
Calcium: 9.2 mg/dL (ref 8.9–10.3)
Chloride: 101 mmol/L (ref 98–111)
Creatinine, Ser: 0.93 mg/dL (ref 0.61–1.24)
GFR, Estimated: >60 mL/min (ref >60)
Glucose, Bld: 220 mg/dL — ABNORMAL HIGH (ref 70–99)
Potassium: 4.5 mmol/L (ref 3.5–5.1)
Sodium: 135 mmol/L (ref 135–145)

## 2020-04-13 MED ORDER — IOHEXOL 350 MG/ML SOLN
80.0000 mL | Freq: Once | INTRAVENOUS | Status: AC | PRN
  Administered 2020-04-13: 80 mL via INTRAVENOUS

## 2020-04-13 MED ORDER — PREDNISONE 20 MG PO TABS: ORAL_TABLET | ORAL | 0 refills | Status: DC

## 2020-04-13 MED ORDER — LIVING WELL WITH DIABETES BOOK
Freq: Once | Status: AC
  Administered 2020-04-13: 1
  Filled 2020-04-13: qty 1

## 2020-04-13 MED ORDER — GLIPIZIDE 5 MG PO TABS: 2.5000 mg | ORAL_TABLET | Freq: Two times a day (BID) | ORAL | 0 refills | Status: DC

## 2020-04-13 NOTE — Progress Notes (Signed)
Inpatient Diabetes Program Recommendations  AACE/ADA: New Consensus Statement on Inpatient Glycemic Control (2015)  Target Ranges:  Prepandial:   less than 140 mg/dL      Peak postprandial:   less than 180 mg/dL (1-2 hours)      Critically ill patients:  140 - 180 mg/dL   Lab Results  Component Value Date   GLUCAP 136 (H) 04/13/2020   HGBA1C 6.6 (H) 04/12/2020    Review of Glycemic Control Results for KEISON, GLENDINNING (MRN [id number]) as of 04/13/2020 10:45  Ref. Range 04/12/2020 08:39 04/12/2020 11:17 04/12/2020 16:26 04/12/2020 21:26 04/13/2020 06:06  Glucose-Capillary Latest Ref Range: 70 - 99 mg/dL 267 (H) 124 (H) 580 (H) 243 (H) 136 (H)   Diabetes history: No prior hx DM Current orders for Inpatient glycemic control: Novolog 0-6 units tid  Inpatient Diabetes Program Recommendations:    Received diabetes coordinator consult. Spoke with patient vis phone (DM coordinator @ [employer]) regarding A1c of 6.6. Patient verified he has been on steroids @ home prior to admission. Patient has been drinking regular sodas but agrees to drink mostly water and limit carbohydrates. Patient does his own cooking @ home and feels confident that he can follow through with decreased sugar and carbohydrates. Ordered Living Well with Diabetes booklet and patient agrees to review. Will follow patient for additional needs.  Thank you, Glenn [name]. Ann-Marie Kluge, RN, MSN, CDE  Diabetes Coordinator Inpatient Glycemic Control Team Team Pager [phone] (8am-5pm) 04/13/2020 10:56 AM

## 2020-04-13 NOTE — Progress Notes (Signed)
Bilateral lower extremity venous study completed.      Please see CV Proc for preliminary results.   Kortland Nichols, RVT  

## 2020-04-13 NOTE — Progress Notes (Signed)
  Echocardiogram 2D Echocardiogram has been performed.  Glenn [name] 04/13/2020, 10:50 AM

## 2020-04-13 NOTE — Discharge Summary (Signed)
Physician Discharge Summary  Glenn [name] YQI:[id number] DOB: 1960-07-01 DOA: 04/11/2020  PCP: Fleet Contras, MD  Admit date: 04/11/2020 Discharge date: 04/13/2020  Admitted From: Home  Disposition: Home   Recommendations for Outpatient Follow-up:  1. Follow up with PCP in 1-2 weeks 2. Please obtain BMP/CBC in one week 3. Needs further management of Diabetes.  4. Needs follow up with pulmonologist.  5.   Home Health yes.   Discharge Condition: Stable.  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 -Yesterday was at check H he is became acutely dyspneic went to his car to get the nebulizer and subsequently had transient loss of consciousness and brought to the emergency room. -In the ED he was mildly tachycardic, hypoxic with sats in the high 80s to low  90s , Chest x-ray noted left diaphragm elevation, high-sensitivity troponin was 74, Covid PCR was negative he was placed on BiPAP steroids and nebs  Acute hypoxic respiratory failure COPD with acute exacerbation -Off BiPAP now -Treated  IV Solu-Medrol today, ceftriaxone, duo nebs -he is off oxygen.  -Breathing at baseline.  -plan to discharge on prednisone taper.  -Elevated D dimer, tachycardia. CT angio negative for PE, Doppler LE negative for DVT.    Mildly elevated troponin -Suspect this is secondary to demand, no symptoms of ACS, EKG without acute ST-T wave changes -ECHO; Normal EF. No wall motion abnormalities  Hyperglycemia, New DM -Secondary to steroids, hemoglobin A1c is 6.6 -started on glipizide while he is on prednisone.  -Could consider metformin when off prednisone.   Hypertension -Continue Norvasc and HCTZ  Elevated transaminases -viral hepatitis panel negative.  -trending  down.    Discharge Diagnoses:  Principal Problem:   COPD with acute exacerbation (HCC) Active Problems:   Elevated troponin   Acute on chronic respiratory failure with hypoxia (HCC)   Hyperglycemia    Discharge Instructions  Discharge Instructions    Diet - low sodium heart healthy   Complete by: As directed    Increase activity slowly   Complete by: As directed      Allergies as of 04/13/2020      Reactions   Ibuprofen    Abdominal sensitivity   Shrimp [shellfish Allergy] Itching   Tylenol [acetaminophen]    Abdominal sensitvity   Lisinopril Rash   Tongue swelling      Medication List    STOP taking these medications   methylPREDNISolone 4 MG Tbpk tablet Commonly known as: MEDROL DOSEPAK     TAKE these medications   albuterol (2.5 MG/3ML) 0.083% nebulizer solution Commonly known as: PROVENTIL PLEASE SEE ATTACHED FOR DETAILED DIRECTIONS What changed: See the new instructions.   albuterol 108 (90 Base) MCG/ACT inhaler Commonly known as: Proventil HFA Inhale 2 puffs into the lungs every 4 (four) hours as needed for wheezing or shortness of breath. What changed: Another medication with the same name was changed. Make sure you understand how and when to take each.   Amitiza 24 MCG capsule Generic drug: lubiprostone Take 24 mcg by mouth at bedtime.   amLODipine 10 MG tablet Commonly known as: NORVASC Take 10 mg by mouth daily.   Aspirin Low Dose 81 MG EC tablet Generic drug: aspirin Take 81 mg by mouth daily.   budesonide-formoterol 160-4.5 MCG/ACT inhaler Commonly known as: Symbicort Inhale  2 puffs into the lungs 2 (two) times daily.   escitalopram 10 MG tablet Commonly known as: LEXAPRO Take 10 mg by mouth daily.   gabapentin 300 MG capsule Commonly known as: Neurontin Take 1 capsule (300 mg total) by mouth 3 (three) times daily. What changed: when to take this   glipiZIDE 5 MG tablet Commonly known as: GLUCOTROL Take 0.5 tablets (2.5 mg total)  by mouth 2 (two) times daily for 20 days.   hydrochlorothiazide 12.5 MG tablet Commonly known as: HYDRODIURIL Take 12.5 mg by mouth daily.   hydrOXYzine 25 MG tablet Commonly known as: ATARAX/VISTARIL Take 25 mg by mouth 2 (two) times daily as needed for anxiety.   metaxalone 800 MG tablet Commonly known as: SKELAXIN Take 1 tablet (800 mg total) by mouth 3 (three) times daily. What changed:   when to take this  reasons to take this   nitroGLYCERIN 0.4 MG SL tablet Commonly known as: NITROSTAT Place 0.4 mg under the tongue every 5 (five) minutes as needed for chest pain.   oxyCODONE 15 MG immediate release tablet Commonly known as: ROXICODONE Take 15 mg by mouth every 6 (six) hours as needed for pain.   pantoprazole 40 MG tablet Commonly known as: PROTONIX Take 1 tablet (40 mg total) by mouth daily.   predniSONE 20 MG tablet Commonly known as: DELTASONE Take 40 mg for 3 days then 30 mg for 3 days then 20 mg for 3 days then stop. What changed:   medication strength  additional instructions  Another medication with the same name was removed. Continue taking this medication, and follow the directions you see here.   sildenafil 100 MG tablet Commonly known as: VIAGRA Take 100 mg by mouth daily as needed for erectile dysfunction.   Spiriva Respimat 2.5 MCG/ACT Aers Generic drug: Tiotropium Bromide Monohydrate 2 puffs each am   tamsulosin 0.4 MG Caps capsule Commonly known as: FLOMAX Take 0.4 mg by mouth every evening.   Vitamin D (Ergocalciferol) 1.25 MG (50000 UNIT) Caps capsule Commonly known as: DRISDOL Take 50,000 Units by mouth once a week. Mondays       Allergies  Allergen Reactions  . Ibuprofen     Abdominal sensitivity  . Shrimp [Shellfish Allergy] Itching  . Tylenol [Acetaminophen]     Abdominal sensitvity  . Lisinopril Rash    Tongue swelling    Consultations:  None   Procedures/Studies: CT ANGIO CHEST PE W OR WO CONTRAST  Result  Date: 04/13/2020 CLINICAL DATA:  Respiratory failure.  Question pulmonary emboli. EXAM: CT ANGIOGRAPHY CHEST WITH CONTRAST TECHNIQUE: Multidetector CT imaging of the chest was performed using the standard protocol during bolus administration of intravenous contrast. Multiplanar CT image reconstructions and MIPs were obtained to evaluate the vascular anatomy. CONTRAST:  39mL OMNIPAQUE IOHEXOL 350 MG/ML SOLN COMPARISON:  Chest radiography 04/11/2020. CT angiography 06/10/2019. FINDINGS: Cardiovascular: Heart size upper limits of normal. No pericardial effusion. Some coronary artery calcification is visible. Mild aortic atherosclerotic calcification is visible. Pulmonary arterial opacification is good. There are no pulmonary emboli. Mediastinum/Nodes: No mass or lymphadenopathy. Lungs/Pleura: No pleural effusion. No pneumothorax or hemothorax. Underlying pattern of pulmonary emphysema and scarring. Areas of previous pulmonary resection. Areas of bronchiectasis in both lungs. No sign of active infiltrate, collapse or edema. Upper Abdomen: Normal Musculoskeletal: Normal Review of the MIP images confirms the above findings. IMPRESSION: 1. No pulmonary emboli. 2. Underlying pattern of pulmonary emphysema and scarring. Areas of previous pulmonary resection. Areas of bronchiectasis in both lungs.  No active infiltrate, collapse or edema. 3. Coronary artery calcification. Mild aortic atherosclerosis. Aortic Atherosclerosis (ICD10-I70.0) and Emphysema (ICD10-J43.9). Electronically Signed   By: Paulina FusiMark  Shogry M.D.   On: 04/13/2020 10:31   DG Chest Port 1 View  Result Date: 04/11/2020 CLINICAL DATA:  Shortness of breath.  Respiratory arrest EXAM: PORTABLE CHEST 1 VIEW COMPARISON:  06/29/2019 FINDINGS: Patient rotated right. Numerous leads and wires project over the chest. Normal heart size. Moderate left hemidiaphragm elevation. No pleural effusion or pneumothorax. Lung volumes are low. This accentuates the pulmonary  interstitium. Volume loss in the right lung base. Surgical changes in the left hilum and likely the left lung base. No well-defined lobar consolidation. Volume loss adjacent to the elevated left hemidiaphragm. IMPRESSION: No acute cardiopulmonary disease. Moderate left hemidiaphragm elevation with adjacent volume loss. Low lung volumes throughout with resultant pulmonary interstitial prominence. Electronically Signed   By: Jeronimo GreavesKyle  Talbot M.D.   On: 04/11/2020 16:25   ECHOCARDIOGRAM COMPLETE  Result Date: 04/13/2020    ECHOCARDIOGRAM REPORT   Patient Name:   Glenn [name] Date of Exam: 04/13/2020 Medical Rec #:  [id number]   Height:       65.0 in Accession #:    [id number]  Weight:       137.5 lb Date of Birth:  09-Jul-1960   BSA:          1.687 m Patient Age:    59 years    BP:           131/91 mmHg Patient Gender: M           HR:           97 bpm. Exam Location:  Inpatient Procedure: 2D Echo Indications:    dyspnea  History:        Patient has no prior history of Echocardiogram examinations.                 COPD.  Sonographer:    Delcie RochLauren Pennington Referring Phys: [phone] PREETHA JOSEPH IMPRESSIONS  1. Left ventricular ejection fraction, by estimation, is 55 to 60%. The left ventricle has normal function. The left ventricle has no regional wall motion abnormalities. There is mild concentric left ventricular hypertrophy. Left ventricular diastolic parameters are consistent with Grade I diastolic dysfunction (impaired relaxation).  2. Right ventricular systolic function is normal. The right ventricular size is normal. There is normal pulmonary artery systolic pressure.  3. The mitral valve is degenerative. Trivial mitral valve regurgitation.  4. The aortic valve is tricuspid. Aortic valve regurgitation is not visualized. No aortic stenosis is present.  5. The inferior vena cava is dilated in size with >50% respiratory variability, suggesting right atrial pressure of 8 mmHg. Comparison(s): No prior Echocardiogram. FINDINGS   Left Ventricle: Left ventricular ejection fraction, by estimation, is 55 to 60%. The left ventricle has normal function. The left ventricle has no regional wall motion abnormalities. The left ventricular internal cavity size was normal in size. There is  mild concentric left ventricular hypertrophy. Left ventricular diastolic parameters are consistent with Grade I diastolic dysfunction (impaired relaxation). Right Ventricle: The right ventricular size is normal. No increase in right ventricular wall thickness. Right ventricular systolic function is normal. There is normal pulmonary artery systolic pressure. The tricuspid regurgitant velocity is 2.85 m/s, and  with an assumed right atrial pressure of 3 mmHg, the estimated right ventricular systolic pressure is 35.5 mmHg. Left Atrium: Left atrial size was normal in size. Right Atrium: Right atrial size was normal  in size. Pericardium: There is no evidence of pericardial effusion. Mitral Valve: The mitral valve is degenerative in appearance. There is mild thickening of the mitral valve leaflet(s). There is mild calcification of the mitral valve leaflet(s). Mild mitral annular calcification. Trivial mitral valve regurgitation. Tricuspid Valve: The tricuspid valve is normal in structure. Tricuspid valve regurgitation is trivial. Aortic Valve: The aortic valve is tricuspid. Aortic valve regurgitation is not visualized. No aortic stenosis is present. Pulmonic Valve: The pulmonic valve was normal in structure. Pulmonic valve regurgitation is not visualized. Aorta: The aortic root and ascending aorta are structurally normal, with no evidence of dilitation. Venous: The inferior vena cava is dilated in size with greater than 50% respiratory variability, suggesting right atrial pressure of 8 mmHg. IAS/Shunts: No atrial level shunt detected by color flow Doppler.  LEFT VENTRICLE PLAX 2D LVIDd:         3.80 cm  Diastology LVIDs:         3.00 cm  LV e' medial:    8.81 cm/s LV PW:          1.10 cm  LV E/e' medial:  8.3 LV IVS:        0.90 cm  LV e' lateral:   8.92 cm/s LVOT diam:     2.20 cm  LV E/e' lateral: 8.2 LV SV:         78 LV SV Index:   46 LVOT Area:     3.80 cm  RIGHT VENTRICLE             IVC RV S prime:     13.20 cm/s  IVC diam: 2.00 cm TAPSE (M-mode): 1.8 cm LEFT ATRIUM             Index       RIGHT ATRIUM           Index LA diam:        3.50 cm 2.07 cm/m  RA Area:     13.60 cm LA Vol (A2C):   75.6 ml 44.82 ml/m RA Volume:   30.50 ml  18.08 ml/m LA Vol (A4C):   55.7 ml 33.02 ml/m LA Biplane Vol: 65.7 ml 38.95 ml/m  AORTIC VALVE LVOT Vmax:   111.00 cm/s LVOT Vmean:  73.000 cm/s LVOT VTI:    0.205 m  AORTA Ao Asc diam: 3.40 cm MITRAL VALVE               TRICUSPID VALVE MV Area (PHT): 5.13 cm    TR Peak grad:   32.5 mmHg MV Decel Time: 148 msec    TR Vmax:        285.00 cm/s MV E velocity: 72.80 cm/s MV A velocity: 86.50 cm/s  SHUNTS MV E/A ratio:  0.84        Systemic VTI:  0.20 m                            Systemic Diam: 2.20 cm Laurance Flatten MD Electronically signed by Laurance Flatten MD Signature Date/Time: 04/13/2020/1:51:52 PM    Final    VAS [place] LOWER EXTREMITY VENOUS (DVT)  Result Date: 04/13/2020  Lower Venous DVT Study Indications: D-Dimer.  Risk Factors: None identified. Comparison Study: No previous Exams Performing Technologist: Clint Guy RVT  Examination Guidelines: A complete evaluation includes B-mode imaging, spectral Doppler, color Doppler, and power Doppler as needed of all accessible portions of each vessel. Bilateral testing is considered an integral  part of a complete examination. Limited examinations for reoccurring indications may be performed as noted. The reflux portion of the exam is performed with the patient in reverse Trendelenburg.  +---------+---------------+---------+-----------+----------+--------------+ RIGHT    CompressibilityPhasicitySpontaneityPropertiesThrombus Aging  +---------+---------------+---------+-----------+----------+--------------+ CFV      Full           Yes      Yes                                 +---------+---------------+---------+-----------+----------+--------------+ SFJ      Full                                                        +---------+---------------+---------+-----------+----------+--------------+ FV Prox  Full                                                        +---------+---------------+---------+-----------+----------+--------------+ FV Mid   Full                                                        +---------+---------------+---------+-----------+----------+--------------+ FV DistalFull                                                        +---------+---------------+---------+-----------+----------+--------------+ PFV      Full                                                        +---------+---------------+---------+-----------+----------+--------------+ POP      Full           Yes      Yes                                 +---------+---------------+---------+-----------+----------+--------------+ PTV      Full                                                        +---------+---------------+---------+-----------+----------+--------------+ PERO     Full                                                        +---------+---------------+---------+-----------+----------+--------------+   +---------+---------------+---------+-----------+----------+--------------+ LEFT     CompressibilityPhasicitySpontaneityPropertiesThrombus Aging +---------+---------------+---------+-----------+----------+--------------+ CFV  Full           Yes      Yes                                 +---------+---------------+---------+-----------+----------+--------------+ SFJ      Full                                                         +---------+---------------+---------+-----------+----------+--------------+ FV Prox  Full                                                        +---------+---------------+---------+-----------+----------+--------------+ FV Mid   Full                                                        +---------+---------------+---------+-----------+----------+--------------+ FV DistalFull                                                        +---------+---------------+---------+-----------+----------+--------------+ PFV      Full                                                        +---------+---------------+---------+-----------+----------+--------------+ POP      Full           Yes      Yes                                 +---------+---------------+---------+-----------+----------+--------------+ PTV      Full                                                        +---------+---------------+---------+-----------+----------+--------------+ PERO     Full                                                        +---------+---------------+---------+-----------+----------+--------------+     Summary: BILATERAL: - No evidence of deep vein thrombosis seen in the lower extremities, bilaterally. -No evidence of popliteal cyst, bilaterally.   *See table(s) above for measurements and observations.    Preliminary      Subjective: He is breathing better  Discharge Exam: Vitals:   04/13/20 0805 04/13/20 0809  BP: Marland Kitchen)  131/91   Pulse: 99   Resp:    Temp: 97.7 F (36.5 C)   SpO2: 98% 98%     General: Pt is alert, awake, not in acute distress Cardiovascular: RRR, S1/S2 +, no rubs, no gallops Respiratory: CTA bilaterally, no wheezing, no rhonchi Abdominal: Soft, NT, ND, bowel sounds + Extremities: no edema, no cyanosis    The results of significant diagnostics from this hospitalization (including imaging, microbiology, ancillary and laboratory) are listed below for  reference.     Microbiology: Recent Results (from the past 240 hour(s))  Resp Panel by RT-PCR (Flu A&B, Covid) Nasopharyngeal Swab     Status: None   Collection Time: 04/11/20  3:52 PM   Specimen: Nasopharyngeal Swab; Nasopharyngeal(NP) swabs in vial transport medium  Result Value Ref Range Status   SARS Coronavirus 2 by RT PCR NEGATIVE NEGATIVE Final    Comment: (NOTE) SARS-CoV-2 target nucleic acids are NOT DETECTED.  The SARS-CoV-2 RNA is generally detectable in upper respiratory specimens during the acute phase of infection. The lowest concentration of SARS-CoV-2 viral copies this assay can detect is 138 copies/mL. A negative result does not preclude SARS-Cov-2 infection and should not be used as the sole basis for treatment or other patient management decisions. A negative result may occur with  improper specimen collection/handling, submission of specimen other than nasopharyngeal swab, presence of viral mutation(s) within the areas targeted by this assay, and inadequate number of viral copies(<138 copies/mL). A negative result must be combined with clinical observations, patient history, and epidemiological information. The expected result is Negative.  Fact Sheet for Patients:  BloggerCourse.com  Fact Sheet for Healthcare Providers:  SeriousBroker.it  This test is no t yet approved or cleared by the [place] FDA and  has been authorized for detection and/or diagnosis of SARS-CoV-2 by FDA under an Emergency Use Authorization (EUA). This EUA will remain  in effect (meaning this test can be used) for the duration of the COVID-19 declaration under Section 564(b)(1) of the Act, 21 U.S.C.section 360bbb-3(b)(1), unless the authorization is terminated  or revoked sooner.       Influenza A by PCR NEGATIVE NEGATIVE Final   Influenza B by PCR NEGATIVE NEGATIVE Final    Comment: (NOTE) The Xpert Xpress SARS-CoV-2/FLU/RSV  plus assay is intended as an aid in the diagnosis of influenza from Nasopharyngeal swab specimens and should not be used as a sole basis for treatment. Nasal washings and aspirates are unacceptable for Xpert Xpress SARS-CoV-2/FLU/RSV testing.  Fact Sheet for Patients: BloggerCourse.com  Fact Sheet for Healthcare Providers: SeriousBroker.it  This test is not yet approved or cleared by the [place] FDA and has been authorized for detection and/or diagnosis of SARS-CoV-2 by FDA under an Emergency Use Authorization (EUA). This EUA will remain in effect (meaning this test can be used) for the duration of the COVID-19 declaration under Section 564(b)(1) of the Act, 21 U.S.C. section 360bbb-3(b)(1), unless the authorization is terminated or revoked.  Performed at [facility], [address], [place], [place] [address]      Labs: BNP (last 3 results) Recent Labs    06/29/19 2245  BNP 19.3   Basic Metabolic Panel: Recent Labs  Lab 04/11/20 1619 04/12/20 0103 04/13/20 0126  NA 132* 134* 135  K 5.2* 4.8 4.5  CL 97* 97* 101  CO2 GLUCOSE 198* 251* 220*  BUN CREATININE 0.97 0.94 0.93  CALCIUM 9.6 9.3 9.2   Liver Function  Tests: Recent Labs  Lab 04/11/20 1619 04/12/20 0103 04/13/20 0126  AST 118* 59* 25  ALT 71* 64* 44  ALKPHOS 66 58 49  BILITOT 0.7 0.6 0.3  PROT 7.1 6.4* 5.8*  ALBUMIN 4.0 3.5 3.3*   No results for input(s): LIPASE, AMYLASE in the last 168 hours. No results for input(s): AMMONIA in the last 168 hours. CBC: Recent Labs  Lab 04/11/20 1619 04/12/20 0103 04/13/20 0126  WBC 6.5 11.1* 18.1*  NEUTROABS 2.2  --   --   HGB 14.3 13.4 12.5*  HCT 46.0 41.7 37.5*  MCV 88.6 87.1 87.4  PLT 315 278 272   Cardiac Enzymes: No results for input(s): CKTOTAL, CKMB, CKMBINDEX, TROPONINI in the last 168 hours. BNP: Invalid input(s): POCBNP CBG: Recent Labs  Lab 04/12/20 1117  04/12/20 1626 04/12/20 2126 04/13/20 0606 04/13/20 1222  GLUCAP 169* 214* 243* 136* 281*   D-Dimer Recent Labs    04/12/20 0103  DDIMER 1.40*   Hgb A1c Recent Labs    04/12/20 0103  HGBA1C 6.6*   Lipid Profile No results for input(s): CHOL, HDL, LDLCALC, TRIG, CHOLHDL, LDLDIRECT in the last 72 hours. Thyroid function studies No results for input(s): TSH, T4TOTAL, T3FREE, THYROIDAB in the last 72 hours.  Invalid input(s): FREET3 Anemia work up No results for input(s): VITAMINB12, FOLATE, FERRITIN, TIBC, IRON, RETICCTPCT in the last 72 hours. Urinalysis No results found for: COLORURINE, APPEARANCEUR, LABSPEC, PHURINE, GLUCOSEU, HGBUR, BILIRUBINUR, KETONESUR, PROTEINUR, UROBILINOGEN, NITRITE, LEUKOCYTESUR Sepsis Labs Invalid input(s): PROCALCITONIN,  WBC,  LACTICIDVEN Microbiology Recent Results (from the past 240 hour(s))  Resp Panel by RT-PCR (Flu A&B, Covid) Nasopharyngeal Swab     Status: None   Collection Time: 04/11/20  3:52 PM   Specimen: Nasopharyngeal Swab; Nasopharyngeal(NP) swabs in vial transport medium  Result Value Ref Range Status   SARS Coronavirus 2 by RT PCR NEGATIVE NEGATIVE Final    Comment: (NOTE) SARS-CoV-2 target nucleic acids are NOT DETECTED.  The SARS-CoV-2 RNA is generally detectable in upper respiratory specimens during the acute phase of infection. The lowest concentration of SARS-CoV-2 viral copies this assay can detect is 138 copies/mL. A negative result does not preclude SARS-Cov-2 infection and should not be used as the sole basis for treatment or other patient management decisions. A negative result may occur with  improper specimen collection/handling, submission of specimen other than nasopharyngeal swab, presence of viral mutation(s) within the areas targeted by this assay, and inadequate number of viral copies(<138 copies/mL). A negative result must be combined with clinical observations, patient history, and  epidemiological information. The expected result is Negative.  Fact Sheet for Patients:  BloggerCourse.com  Fact Sheet for Healthcare Providers:  SeriousBroker.it  This test is no t yet approved or cleared by the [place] FDA and  has been authorized for detection and/or diagnosis of SARS-CoV-2 by FDA under an Emergency Use Authorization (EUA). This EUA will remain  in effect (meaning this test can be used) for the duration of the COVID-19 declaration under Section 564(b)(1) of the Act, 21 U.S.C.section 360bbb-3(b)(1), unless the authorization is terminated  or revoked sooner.       Influenza A by PCR NEGATIVE NEGATIVE Final   Influenza B by PCR NEGATIVE NEGATIVE Final    Comment: (NOTE) The Xpert Xpress SARS-CoV-2/FLU/RSV plus assay is intended as an aid in the diagnosis of influenza from Nasopharyngeal swab specimens and should not be used as a sole basis for treatment. Nasal washings and aspirates are unacceptable for Xpert Xpress SARS-CoV-2/FLU/RSV  testing.  Fact Sheet for Patients: BloggerCourse.com  Fact Sheet for Healthcare Providers: SeriousBroker.it  This test is not yet approved or cleared by the [place] FDA and has been authorized for detection and/or diagnosis of SARS-CoV-2 by FDA under an Emergency Use Authorization (EUA). This EUA will remain in effect (meaning this test can be used) for the duration of the COVID-19 declaration under Section 564(b)(1) of the Act, 21 U.S.C. section 360bbb-3(b)(1), unless the authorization is terminated or revoked.  Performed at [facility], [address] [place], [place] [address]      Time coordinating discharge: 40 minutes  SIGNED:   Alba Cory, MD  [facility]

## 2020-04-13 NOTE — Progress Notes (Signed)
Pt ambulated alone 5 times both halls without oxygen. Patient tolerated the ambulation well. No sign of distress or fatigue noted.

## 2020-04-13 NOTE — Progress Notes (Signed)
Pt discharged home with wife. Pt received discharge instructions and all questions were answered. IVs and telemetry box removed. Pt left with all belongings. Pt discharged via wheelchair and was accompanied by pt's NT.

## 2020-04-14 ENCOUNTER — Encounter (HOSPITAL_COMMUNITY): Payer: Self-pay

## 2020-04-14 ENCOUNTER — Other Ambulatory Visit: Payer: Self-pay

## 2020-04-14 ENCOUNTER — Emergency Department (HOSPITAL_COMMUNITY)
Admission: EM | Admit: 2020-04-14 | Discharge: 2020-04-14 | Disposition: A | Discharge status: Home / Self Care | Payer: Medicaid Other | Attending: Emergency Medicine | Admitting: Emergency Medicine

## 2020-04-14 DIAGNOSIS — T7840XA Allergy, unspecified, initial encounter: Secondary | ICD-10-CM | POA: Diagnosis present

## 2020-04-14 DIAGNOSIS — J45909 Unspecified asthma, uncomplicated: Secondary | ICD-10-CM | POA: Diagnosis not present

## 2020-04-14 DIAGNOSIS — J449 Chronic obstructive pulmonary disease, unspecified: Secondary | ICD-10-CM | POA: Diagnosis not present

## 2020-04-14 DIAGNOSIS — T783XXA Angioneurotic edema, initial encounter: Secondary | ICD-10-CM | POA: Insufficient documentation

## 2020-04-14 DIAGNOSIS — I10 Essential (primary) hypertension: Secondary | ICD-10-CM | POA: Diagnosis not present

## 2020-04-14 DIAGNOSIS — Z7982 Long term (current) use of aspirin: Secondary | ICD-10-CM | POA: Diagnosis not present

## 2020-04-14 DIAGNOSIS — F17210 Nicotine dependence, cigarettes, uncomplicated: Secondary | ICD-10-CM | POA: Diagnosis not present

## 2020-04-14 DIAGNOSIS — Z7951 Long term (current) use of inhaled steroids: Secondary | ICD-10-CM | POA: Diagnosis not present

## 2020-04-14 DIAGNOSIS — K148 Other diseases of tongue: Secondary | ICD-10-CM | POA: Insufficient documentation

## 2020-04-14 DIAGNOSIS — Z79899 Other long term (current) drug therapy: Secondary | ICD-10-CM | POA: Diagnosis not present

## 2020-04-14 LAB — CBG MONITORING, ED: Glucose-Capillary: 114 mg/dL — ABNORMAL HIGH (ref 70–99)

## 2020-04-14 MED ORDER — OXYCODONE HCL 5 MG PO TABS
15.0000 mg | ORAL_TABLET | Freq: Once | ORAL | Status: AC
Start: 2020-04-14 — End: 2020-04-14
  Administered 2020-04-14: 15 mg via ORAL
  Filled 2020-04-14: qty 3

## 2020-04-14 MED ORDER — DIPHENHYDRAMINE HCL 50 MG/ML IJ SOLN
25.0000 mg | Freq: Once | INTRAMUSCULAR | Status: AC
  Administered 2020-04-14: 25 mg via INTRAVENOUS
  Filled 2020-04-14: qty 1

## 2020-04-14 MED ORDER — CETIRIZINE HCL 10 MG PO TABS: 10.0000 mg | ORAL_TABLET | Freq: Two times a day (BID) | ORAL | 0 refills | Status: DC | PRN

## 2020-04-14 NOTE — Discharge Instructions (Signed)
Return for worsening tongue swelling or difficulty breathing.  Follow-up with your doctor for further work-up of the swelling of the tongue.

## 2020-04-14 NOTE — ED Notes (Signed)
Pt given [place] sandwich and ginger ale per MD.

## 2020-04-14 NOTE — ED Notes (Signed)
Pt verbalizes understanding of discharge instructions. Opportunity for questions and answers were provided. Pt discharged from the ED.   ?

## 2020-04-14 NOTE — ED Notes (Signed)
RN notified about vitals 

## 2020-04-14 NOTE — ED Provider Notes (Signed)
[facility] EMERGENCY DEPARTMENT Provider Note   CSN: [id number] Arrival date & time: 04/14/20  1009     History Chief Complaint  Patient presents with  . Allergic Reaction    Glenn [name] is a 60 y.o. male.  HPI Patient presents with tongue swelling.  Was in the hospital yesterday for COPD.  Currently on steroids.  States last night began to feel his tongue swelled up.  States it is worse today.  States he bit his tongue today because of it being swollen.  States that he has a different voice now because of the swelling.  No difficulty swallowing.  No fevers.  States he previously had an episode and had an EpiPen for.  Also had been on lisinopril per records.    Past Medical History:  Diagnosis Date  . Arthritis   . Asthma   . COPD (chronic obstructive pulmonary disease) (HCC)   . ED (erectile dysfunction)   . Hypertension     Patient Active Problem List   Diagnosis Date Noted  . COPD (chronic obstructive pulmonary disease) (HCC) 04/11/2020  . Elevated troponin 04/11/2020  . Acute on chronic respiratory failure with hypoxia (HCC) 04/11/2020  . Hyperglycemia 04/11/2020  . Elevated transaminase level   . COPD with acute exacerbation (HCC) 06/30/2019  . Acute respiratory failure with hypoxemia (HCC) 06/30/2019  . Hypotension 06/30/2019  . Near syncope 06/30/2019  . Pleural nodule 06/30/2019  . Colon cancer screening 12/18/2017  . History of colonic polyps 12/18/2017  . 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 COPD GOLD III/ actively smoking  10/05/2016    Past Surgical History:  Procedure Laterality Date  . LUNG SURGERY     removed tissue       Family History  Problem Relation Age of Onset  . Hypertension Mother   . Diabetes Maternal Grandmother   . Colon cancer Neg Hx   . Esophageal cancer Neg Hx   . Rectal cancer Neg Hx   . Inflammatory bowel disease Neg Hx   . Liver disease Neg Hx   . Pancreatic cancer Neg Hx     Social History   Tobacco  Use  . Smoking status: Current Every Day Smoker    Packs/day: 0.50    Years: 34.00    Pack years: 17.00    Types: Cigarettes  . Smokeless tobacco: Never Used  Vaping Use  . Vaping Use: Never used  Substance Use Topics  . Alcohol use: Never  . Drug use: Never    Home Medications Prior to Admission medications   Medication Sig Start Date End Date Taking? Authorizing Provider  cetirizine (ZYRTEC ALLERGY) 10 MG tablet Take 1 tablet (10 mg total) by mouth 2 (two) times daily as needed for allergies (swelling). 04/14/20  Yes Benjiman Core, MD  albuterol (PROVENTIL HFA) 108 (90 Base) MCG/ACT inhaler Inhale 2 puffs into the lungs every 4 (four) hours as needed for wheezing or shortness of breath. 11/20/18   [name], MD  albuterol (PROVENTIL) (2.5 MG/3ML) 0.083% nebulizer solution PLEASE SEE ATTACHED FOR DETAILED DIRECTIONS Patient taking differently: Take 2.5 mg by nebulization every 6 (six) hours as needed for wheezing or shortness of breath. 02/26/18   [name], MD  AMITIZA 24 MCG capsule Take 24 mcg by mouth at bedtime. 03/31/20   [provider]  amLODipine (NORVASC) 10 MG tablet Take 10 mg by mouth daily.    [provider]  ASPIRIN LOW DOSE 81 MG EC tablet Take 81  mg by mouth daily. 04/29/19   [provider]  budesonide-formoterol (SYMBICORT) 160-4.5 MCG/ACT inhaler Inhale 2 puffs into the lungs 2 (two) times daily. 05/13/19   Nyoka CowdenWert, Michael B, MD  escitalopram (LEXAPRO) 10 MG tablet Take 10 mg by mouth daily. 03/10/20   [provider]  gabapentin (NEURONTIN) 300 MG capsule Take 1 capsule (300 mg total) by mouth 3 (three) times daily. Patient taking differently: Take 300 mg by mouth daily. 02/09/18   Eustace MooreNelson, Yvonne Sue, MD  glipiZIDE (GLUCOTROL) 5 MG tablet Take 0.5 tablets (2.5 mg total) by mouth 2 (two) times daily for 20 days. 04/13/20 05/03/20  Regalado, Belkys A, MD  hydrochlorothiazide (HYDRODIURIL) 12.5 MG tablet Take 12.5 mg by mouth daily.  03/26/20   [provider]  metaxalone (SKELAXIN) 800 MG tablet Take 1 tablet (800 mg total) by mouth 3 (three) times daily. Patient taking differently: Take 800 mg by mouth 3 (three) times daily as needed for muscle spasms. 01/30/20   Rhys MartiniGraham, Laura E, PA-C  nitroGLYCERIN (NITROSTAT) 0.4 MG SL tablet Place 0.4 mg under the tongue every 5 (five) minutes as needed for chest pain.    [provider]  oxyCODONE (ROXICODONE) 15 MG immediate release tablet Take 15 mg by mouth every 6 (six) hours as needed for pain. 03/31/20   [provider]  pantoprazole (PROTONIX) 40 MG tablet Take 1 tablet (40 mg total) by mouth daily. 07/02/19   Elgergawy, Leana Roeawood S, MD  predniSONE (DELTASONE) 20 MG tablet Take 40 mg for 3 days then 30 mg for 3 days then 20 mg for 3 days then stop. 04/13/20   Regalado, Belkys A, MD  sildenafil (VIAGRA) 100 MG tablet Take 100 mg by mouth daily as needed for erectile dysfunction.    [provider]  tamsulosin (FLOMAX) 0.4 MG CAPS capsule Take 0.4 mg by mouth every evening. 06/26/19   [provider]  Tiotropium Bromide Monohydrate (SPIRIVA RESPIMAT) 2.5 MCG/ACT AERS 2 puffs each am 01/15/20   Nyoka CowdenWert, Michael B, MD  Vitamin D, Ergocalciferol, (DRISDOL) 1.25 MG (50000 UNIT) CAPS capsule Take 50,000 Units by mouth once a week. Mondays 05/02/19   [provider]    Allergies    Ibuprofen, Shrimp [shellfish allergy], Tylenol [acetaminophen], and Lisinopril  Review of Systems   Review of Systems  Constitutional: Negative for activity change.  HENT: Positive for voice change.        Tongue swelling  Eyes: Negative for visual disturbance.  Respiratory: Negative for shortness of breath.   Cardiovascular: Negative for chest pain.  Gastrointestinal: Negative for abdominal pain.  Genitourinary: Negative for flank pain.  Musculoskeletal: Negative for back pain.  Skin: Negative for rash.  Neurological: Negative for weakness.   Psychiatric/Behavioral: Negative for confusion.    Physical Exam Updated Vital Signs BP (!) 161/103   Pulse 95   Temp 98.1 F (36.7 C) (Oral)   Resp 20   Ht 5\' 5"  (1.651 m)   Wt 63.5 kg   SpO2 100%   BMI 23.30 kg/m   Physical Exam Vitals reviewed.  HENT:     Head: Atraumatic.     Mouth/Throat:     Comments: Mildly enlarged tongue for patient.  On the right side there is hematoma from bite.  No edema of uvula.  No stridor. Eyes:     Pupils: Pupils are equal, round, and reactive to light.  Cardiovascular:     Rate and Rhythm: Normal rate and regular rhythm.  Pulmonary:  Breath sounds: No wheezing or rhonchi.  Abdominal:     Tenderness: There is no abdominal tenderness.  Musculoskeletal:     Cervical back: Neck supple.     Right lower leg: No edema.     Left lower leg: No edema.  Skin:    General: Skin is warm.     Capillary Refill: Capillary refill takes less than 2 seconds.  Neurological:     Mental Status: He is alert and oriented to person, place, and time.     ED Results / Procedures / Treatments   Labs (all labs ordered are listed, but only abnormal results are displayed) Labs Reviewed  CBG MONITORING, ED - Abnormal; Notable for the following components:      Result Value   Glucose-Capillary 114 (*)    All other components within normal limits    EKG None  Radiology CT ANGIO CHEST PE W OR WO CONTRAST  Result Date: 04/13/2020 CLINICAL DATA:  Respiratory failure.  Question pulmonary emboli. EXAM: CT ANGIOGRAPHY CHEST WITH CONTRAST TECHNIQUE: Multidetector CT imaging of the chest was performed using the standard protocol during bolus administration of intravenous contrast. Multiplanar CT image reconstructions and MIPs were obtained to evaluate the vascular anatomy. CONTRAST:  75mL OMNIPAQUE IOHEXOL 350 MG/ML SOLN COMPARISON:  Chest radiography 04/11/2020. CT angiography 06/10/2019. FINDINGS: Cardiovascular: Heart size upper limits of normal. No  pericardial effusion. Some coronary artery calcification is visible. Mild aortic atherosclerotic calcification is visible. Pulmonary arterial opacification is good. There are no pulmonary emboli. Mediastinum/Nodes: No mass or lymphadenopathy. Lungs/Pleura: No pleural effusion. No pneumothorax or hemothorax. Underlying pattern of pulmonary emphysema and scarring. Areas of previous pulmonary resection. Areas of bronchiectasis in both lungs. No sign of active infiltrate, collapse or edema. Upper Abdomen: Normal Musculoskeletal: Normal Review of the MIP images confirms the above findings. IMPRESSION: 1. No pulmonary emboli. 2. Underlying pattern of pulmonary emphysema and scarring. Areas of previous pulmonary resection. Areas of bronchiectasis in both lungs. No active infiltrate, collapse or edema. 3. Coronary artery calcification. Mild aortic atherosclerosis. Aortic Atherosclerosis (ICD10-I70.0) and Emphysema (ICD10-J43.9). Electronically Signed   By: Paulina Fusi M.D.   On: 04/13/2020 10:31   ECHOCARDIOGRAM COMPLETE  Result Date: 04/13/2020    ECHOCARDIOGRAM REPORT   Patient Name:   Darroll Paci Date of Exam: 04/13/2020 Medical Rec #:  [id number]   Height:       65.0 in Accession #:    [id number]  Weight:       137.5 lb Date of Birth:  Sep 11, 1960   BSA:          1.687 m Patient Age:    59 years    BP:           131/91 mmHg Patient Gender: M           HR:           97 bpm. Exam Location:  Inpatient Procedure: 2D Echo Indications:    dyspnea  History:        Patient has no prior history of Echocardiogram examinations.                 COPD.  Sonographer:    Delcie Roch Referring Phys: [phone] PREETHA JOSEPH IMPRESSIONS  1. Left ventricular ejection fraction, by estimation, is 55 to 60%. The left ventricle has normal function. The left ventricle has no regional wall motion abnormalities. There is mild concentric left ventricular hypertrophy. Left ventricular diastolic parameters are consistent with Grade I diastolic  dysfunction (  impaired relaxation).  2. Right ventricular systolic function is normal. The right ventricular size is normal. There is normal pulmonary artery systolic pressure.  3. The mitral valve is degenerative. Trivial mitral valve regurgitation.  4. The aortic valve is tricuspid. Aortic valve regurgitation is not visualized. No aortic stenosis is present.  5. The inferior vena cava is dilated in size with >50% respiratory variability, suggesting right atrial pressure of 8 mmHg. Comparison(s): No prior Echocardiogram. FINDINGS  Left Ventricle: Left ventricular ejection fraction, by estimation, is 55 to 60%. The left ventricle has normal function. The left ventricle has no regional wall motion abnormalities. The left ventricular internal cavity size was normal in size. There is  mild concentric left ventricular hypertrophy. Left ventricular diastolic parameters are consistent with Grade I diastolic dysfunction (impaired relaxation). Right Ventricle: The right ventricular size is normal. No increase in right ventricular wall thickness. Right ventricular systolic function is normal. There is normal pulmonary artery systolic pressure. The tricuspid regurgitant velocity is 2.85 m/s, and  with an assumed right atrial pressure of 3 mmHg, the estimated right ventricular systolic pressure is 35.5 mmHg. Left Atrium: Left atrial size was normal in size. Right Atrium: Right atrial size was normal in size. Pericardium: There is no evidence of pericardial effusion. Mitral Valve: The mitral valve is degenerative in appearance. There is mild thickening of the mitral valve leaflet(s). There is mild calcification of the mitral valve leaflet(s). Mild mitral annular calcification. Trivial mitral valve regurgitation. Tricuspid Valve: The tricuspid valve is normal in structure. Tricuspid valve regurgitation is trivial. Aortic Valve: The aortic valve is tricuspid. Aortic valve regurgitation is not visualized. No aortic stenosis is  present. Pulmonic Valve: The pulmonic valve was normal in structure. Pulmonic valve regurgitation is not visualized. Aorta: The aortic root and ascending aorta are structurally normal, with no evidence of dilitation. Venous: The inferior vena cava is dilated in size with greater than 50% respiratory variability, suggesting right atrial pressure of 8 mmHg. IAS/Shunts: No atrial level shunt detected by color flow Doppler.  LEFT VENTRICLE PLAX 2D LVIDd:         3.80 cm  Diastology LVIDs:         3.00 cm  LV e' medial:    8.81 cm/s LV PW:         1.10 cm  LV E/e' medial:  8.3 LV IVS:        0.90 cm  LV e' lateral:   8.92 cm/s LVOT diam:     2.20 cm  LV E/e' lateral: 8.2 LV SV:         78 LV SV Index:   46 LVOT Area:     3.80 cm  RIGHT VENTRICLE             IVC RV S prime:     13.20 cm/s  IVC diam: 2.00 cm TAPSE (M-mode): 1.8 cm LEFT ATRIUM             Index       RIGHT ATRIUM           Index LA diam:        3.50 cm 2.07 cm/m  RA Area:     13.60 cm LA Vol (A2C):   75.6 ml 44.82 ml/m RA Volume:   30.50 ml  18.08 ml/m LA Vol (A4C):   55.7 ml 33.02 ml/m LA Biplane Vol: 65.7 ml 38.95 ml/m  AORTIC VALVE LVOT Vmax:   111.00 cm/s LVOT Vmean:  73.000 cm/s LVOT  VTI:    0.205 m  AORTA Ao Asc diam: 3.40 cm MITRAL VALVE               TRICUSPID VALVE MV Area (PHT): 5.13 cm    TR Peak grad:   32.5 mmHg MV Decel Time: 148 msec    TR Vmax:        285.00 cm/s MV E velocity: 72.80 cm/s MV A velocity: 86.50 cm/s  SHUNTS MV E/A ratio:  0.84        Systemic VTI:  0.20 m                            Systemic Diam: 2.20 cm Laurance Flatten MD Electronically signed by Laurance Flatten MD Signature Date/Time: 04/13/2020/1:51:52 PM    Final    VAS [place] LOWER EXTREMITY VENOUS (DVT)  Result Date: 04/13/2020  Lower Venous DVT Study Indications: D-Dimer.  Risk Factors: None identified. Comparison Study: No previous Exams Performing Technologist: Clint Guy RVT  Examination Guidelines: A complete evaluation includes B-mode imaging, spectral  Doppler, color Doppler, and power Doppler as needed of all accessible portions of each vessel. Bilateral testing is considered an integral part of a complete examination. Limited examinations for reoccurring indications may be performed as noted. The reflux portion of the exam is performed with the patient in reverse Trendelenburg.  +---------+---------------+---------+-----------+----------+--------------+ RIGHT    CompressibilityPhasicitySpontaneityPropertiesThrombus Aging +---------+---------------+---------+-----------+----------+--------------+ CFV      Full           Yes      Yes                                 +---------+---------------+---------+-----------+----------+--------------+ SFJ      Full                                                        +---------+---------------+---------+-----------+----------+--------------+ FV Prox  Full                                                        +---------+---------------+---------+-----------+----------+--------------+ FV Mid   Full                                                        +---------+---------------+---------+-----------+----------+--------------+ FV DistalFull                                                        +---------+---------------+---------+-----------+----------+--------------+ PFV      Full                                                        +---------+---------------+---------+-----------+----------+--------------+  POP      Full           Yes      Yes                                 +---------+---------------+---------+-----------+----------+--------------+ PTV      Full                                                        +---------+---------------+---------+-----------+----------+--------------+ PERO     Full                                                        +---------+---------------+---------+-----------+----------+--------------+    +---------+---------------+---------+-----------+----------+--------------+ LEFT     CompressibilityPhasicitySpontaneityPropertiesThrombus Aging +---------+---------------+---------+-----------+----------+--------------+ CFV      Full           Yes      Yes                                 +---------+---------------+---------+-----------+----------+--------------+ SFJ      Full                                                        +---------+---------------+---------+-----------+----------+--------------+ FV Prox  Full                                                        +---------+---------------+---------+-----------+----------+--------------+ FV Mid   Full                                                        +---------+---------------+---------+-----------+----------+--------------+ FV DistalFull                                                        +---------+---------------+---------+-----------+----------+--------------+ PFV      Full                                                        +---------+---------------+---------+-----------+----------+--------------+ POP      Full           Yes      Yes                                 +---------+---------------+---------+-----------+----------+--------------+  PTV      Full                                                        +---------+---------------+---------+-----------+----------+--------------+ PERO     Full                                                        +---------+---------------+---------+-----------+----------+--------------+     Summary: BILATERAL: - No evidence of deep vein thrombosis seen in the lower extremities, bilaterally. -No evidence of popliteal cyst, bilaterally.   *See table(s) above for measurements and observations. Electronically signed by Fabienne Bruns MD on 04/13/2020 at 9:16:35 PM.    Final     Procedures Procedures   Medications Ordered in  ED Medications  diphenhydrAMINE (BENADRYL) injection 25 mg (25 mg Intravenous Given 04/14/20 1038)  oxyCODONE (Oxy IR/ROXICODONE) immediate release tablet 15 mg (15 mg Oral Given 04/14/20 1143)    ED Course  I have reviewed the triage vital signs and the nursing notes.  Pertinent labs & imaging results that were available during my care of the patient were reviewed by me and considered in my medical decision making (see chart for details).    MDM Rules/Calculators/A&P                          Patient with swelling of tongue.  Began last night is stable today may be mildly worsened but did bite his tongue.  Did have history of angioedema.  Had previously been on lisinopril but not on now.  Feels better after IV Benadryl here.  Already on prednisone for his COPD.  I think patient is stable for discharge home.  Doubt acute quick worsening.  Outpatient follow-up.  Will add cetirizine to take as needed.  Will return for worsening symptoms.  Does not appear infectious.  Unsure of cause since not on lisinopril any longer Final Clinical Impression(s) / ED Diagnoses Final diagnoses:  Angioedema, initial encounter    Rx / DC Orders ED Discharge Orders         Ordered    cetirizine (ZYRTEC ALLERGY) 10 MG tablet  2 times daily PRN        04/14/20 1308           Benjiman Core, MD 04/14/20 1614

## 2020-04-14 NOTE — ED Triage Notes (Signed)
Pt reports he is here today due to possible allergic reaction.Pt reports he recently was d/c yesterday and this morning woke up with tongue swelling. Pt airway intact. Pt reports he is still able to eat and drink. Pt thinks it might be related to the change in medication. Pt started taking insulin. Pt able to speak in full and complete sentence

## 2020-04-15 ENCOUNTER — Encounter (HOSPITAL_COMMUNITY): Payer: Self-pay | Admitting: Emergency Medicine

## 2020-04-15 ENCOUNTER — Other Ambulatory Visit: Payer: Self-pay

## 2020-04-15 ENCOUNTER — Emergency Department (HOSPITAL_COMMUNITY): Payer: Medicaid Other

## 2020-04-15 ENCOUNTER — Emergency Department (HOSPITAL_COMMUNITY)
Admission: EM | Admit: 2020-04-15 | Discharge: 2020-04-15 | Disposition: A | Discharge status: Home / Self Care | Payer: Medicaid Other | Attending: Emergency Medicine | Admitting: Emergency Medicine

## 2020-04-15 DIAGNOSIS — J449 Chronic obstructive pulmonary disease, unspecified: Secondary | ICD-10-CM | POA: Diagnosis not present

## 2020-04-15 DIAGNOSIS — Z7982 Long term (current) use of aspirin: Secondary | ICD-10-CM | POA: Insufficient documentation

## 2020-04-15 DIAGNOSIS — F17210 Nicotine dependence, cigarettes, uncomplicated: Secondary | ICD-10-CM | POA: Insufficient documentation

## 2020-04-15 DIAGNOSIS — R42 Dizziness and giddiness: Secondary | ICD-10-CM

## 2020-04-15 DIAGNOSIS — Z7952 Long term (current) use of systemic steroids: Secondary | ICD-10-CM | POA: Diagnosis not present

## 2020-04-15 DIAGNOSIS — J45909 Unspecified asthma, uncomplicated: Secondary | ICD-10-CM | POA: Insufficient documentation

## 2020-04-15 DIAGNOSIS — I10 Essential (primary) hypertension: Secondary | ICD-10-CM | POA: Insufficient documentation

## 2020-04-15 DIAGNOSIS — Z79899 Other long term (current) drug therapy: Secondary | ICD-10-CM | POA: Insufficient documentation

## 2020-04-15 LAB — CBC
HCT: 43.1 % (ref 39.0–52.0)
Hemoglobin: 13.8 g/dL (ref 13.0–17.0)
MCH: 28.4 pg (ref 26.0–34.0)
MCHC: 32 g/dL (ref 30.0–36.0)
MCV: 88.7 fL (ref 80.0–100.0)
Platelets: 252 K/uL (ref 150–400)
RBC: 4.86 MIL/uL (ref 4.22–5.81)
RDW: 14.1 % (ref 11.5–15.5)
WBC: 9.3 K/uL (ref 4.0–10.5)
nRBC: 0 % (ref 0.0–0.2)

## 2020-04-15 LAB — BASIC METABOLIC PANEL WITH GFR
Anion gap: 8 (ref 5–15)
BUN: 11 mg/dL (ref 6–20)
CO2: 29 mmol/L (ref 22–32)
Calcium: 9.3 mg/dL (ref 8.9–10.3)
Chloride: 98 mmol/L (ref 98–111)
Creatinine, Ser: 0.81 mg/dL (ref 0.61–1.24)
GFR, Estimated: >60 mL/min
Glucose, Bld: 91 mg/dL (ref 70–99)
Potassium: 3.9 mmol/L (ref 3.5–5.1)
Sodium: 135 mmol/L (ref 135–145)

## 2020-04-15 LAB — CBG MONITORING, ED: Glucose-Capillary: 207 mg/dL — ABNORMAL HIGH (ref 70–99)

## 2020-04-15 NOTE — Discharge Instructions (Addendum)
As discussed, your evaluation today has been largely reassuring.  But, it is important that you monitor your condition carefully, and do not hesitate to return to the ED if you develop new, or concerning changes in your condition. ? ?Otherwise, please follow-up with your physician for appropriate ongoing care. ? ?

## 2020-04-15 NOTE — ED Notes (Signed)
Patient verbalizes understanding of discharge instructions. Opportunity for questioning and answers were provided. Armband removed by staff, pt discharged from ED ambulatory.   

## 2020-04-15 NOTE — ED Triage Notes (Signed)
Patient complains of lightheadedness after hitting his head two weeks ago. Patient states she bent down in front of a window air conditioner and when he rose back up he hit the air conditioner. Patient states he feels that as time passes from the incident he is becoming more disoriented. Patient is alert, oriented, and in no apparent distress at this time.

## 2020-04-15 NOTE — ED Provider Notes (Signed)
[facility] EMERGENCY DEPARTMENT Provider Note   CSN: [id number] Arrival date & time: 04/15/20  1004     History Chief Complaint  Patient presents with  . Dizziness    Glenn [name] is a 60 y.o. male.  HPI Patient presents 1 day after being seen and evaluated, now with concern for dizziness. Patient has had several recent evaluations for dyspnea, has been seen and evaluated, has been diagnosed with COPD exacerbation.  He notes that throughout this.  He has had some dizziness, but does not focus on this, has not had a specific evaluation for. 2 weeks ago the patient struck his head against an air conditioning unit he notes that not Stainback thereafter he began having episodic dizziness, lightheadedness. There is no complete lack of sensation anywhere, no syncope, no persistent difficulty with speaking.  He does note occasional word finding difficulty, however. He presents with his wife who assists with the history.     Past Medical History:  Diagnosis Date  . Arthritis   . Asthma   . COPD (chronic obstructive pulmonary disease) (HCC)   . ED (erectile dysfunction)   . Hypertension     Patient Active Problem List   Diagnosis Date Noted  . COPD (chronic obstructive pulmonary disease) (HCC) 04/11/2020  . Elevated troponin 04/11/2020  . Acute on chronic respiratory failure with hypoxia (HCC) 04/11/2020  . Hyperglycemia 04/11/2020  . Elevated transaminase level   . COPD with acute exacerbation (HCC) 06/30/2019  . Acute respiratory failure with hypoxemia (HCC) 06/30/2019  . Hypotension 06/30/2019  . Near syncope 06/30/2019  . Pleural nodule 06/30/2019  . Colon cancer screening 12/18/2017  . History of colonic polyps 12/18/2017  . 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 COPD GOLD III/ actively smoking  10/05/2016    Past Surgical History:  Procedure Laterality Date  . LUNG SURGERY     removed tissue       Family History  Problem Relation Age of Onset  .  Hypertension Mother   . Diabetes Maternal Grandmother   . Colon cancer Neg Hx   . Esophageal cancer Neg Hx   . Rectal cancer Neg Hx   . Inflammatory bowel disease Neg Hx   . Liver disease Neg Hx   . Pancreatic cancer Neg Hx     Social History   Tobacco Use  . Smoking status: Current Every Day Smoker    Packs/day: 0.50    Years: 34.00    Pack years: 17.00    Types: Cigarettes  . Smokeless tobacco: Never Used  Vaping Use  . Vaping Use: Never used  Substance Use Topics  . Alcohol use: Never  . Drug use: Never    Home Medications Prior to Admission medications   Medication Sig Start Date End Date Taking? Authorizing Provider  albuterol (PROVENTIL HFA) 108 (90 Base) MCG/ACT inhaler Inhale 2 puffs into the lungs every 4 (four) hours as needed for wheezing or shortness of breath. 11/20/18   [name], MD  albuterol (PROVENTIL) (2.5 MG/3ML) 0.083% nebulizer solution PLEASE SEE ATTACHED FOR DETAILED DIRECTIONS Patient taking differently: Take 2.5 mg by nebulization every 6 (six) hours as needed for wheezing or shortness of breath. 02/26/18   [name], MD  AMITIZA 24 MCG capsule Take 24 mcg by mouth at bedtime. 03/31/20   [provider]  amLODipine (NORVASC) 10 MG tablet Take 10 mg by mouth daily.    [provider]  ASPIRIN LOW DOSE 81 MG EC tablet Take  81 mg by mouth daily. 04/29/19   [provider]  budesonide-formoterol (SYMBICORT) 160-4.5 MCG/ACT inhaler Inhale 2 puffs into the lungs 2 (two) times daily. 05/13/19   [name], MD  cetirizine (ZYRTEC ALLERGY) 10 MG tablet Take 1 tablet (10 mg total) by mouth 2 (two) times daily as needed for allergies (swelling). 04/14/20   Benjiman Core, MD  escitalopram (LEXAPRO) 10 MG tablet Take 10 mg by mouth daily. 03/10/20   [provider]  gabapentin (NEURONTIN) 300 MG capsule Take 1 capsule (300 mg total) by mouth 3 (three) times daily. Patient taking differently: Take 300 mg by mouth  daily. 02/09/18   Eustace Moore, MD  glipiZIDE (GLUCOTROL) 5 MG tablet Take 0.5 tablets (2.5 mg total) by mouth 2 (two) times daily for 20 days. 04/13/20 05/03/20  Regalado, Belkys A, MD  hydrochlorothiazide (HYDRODIURIL) 12.5 MG tablet Take 12.5 mg by mouth daily. 03/26/20   [provider]  metaxalone (SKELAXIN) 800 MG tablet Take 1 tablet (800 mg total) by mouth 3 (three) times daily. Patient taking differently: Take 800 mg by mouth 3 (three) times daily as needed for muscle spasms. 01/30/20   Rhys Martini, PA-C  nitroGLYCERIN (NITROSTAT) 0.4 MG SL tablet Place 0.4 mg under the tongue every 5 (five) minutes as needed for chest pain.    [provider]  oxyCODONE (ROXICODONE) 15 MG immediate release tablet Take 15 mg by mouth every 6 (six) hours as needed for pain. 03/31/20   [provider]  pantoprazole (PROTONIX) 40 MG tablet Take 1 tablet (40 mg total) by mouth daily. 07/02/19   Elgergawy, Leana Roe, MD  predniSONE (DELTASONE) 20 MG tablet Take 40 mg for 3 days then 30 mg for 3 days then 20 mg for 3 days then stop. 04/13/20   Regalado, Belkys A, MD  sildenafil (VIAGRA) 100 MG tablet Take 100 mg by mouth daily as needed for erectile dysfunction.    [provider]  tamsulosin (FLOMAX) 0.4 MG CAPS capsule Take 0.4 mg by mouth every evening. 06/26/19   [provider]  Tiotropium Bromide Monohydrate (SPIRIVA RESPIMAT) 2.5 MCG/ACT AERS 2 puffs each am 01/15/20   [name], MD  Vitamin D, Ergocalciferol, (DRISDOL) 1.25 MG (50000 UNIT) CAPS capsule Take 50,000 Units by mouth once a week. Mondays 05/02/19   [provider]    Allergies    Ibuprofen, Shrimp [shellfish allergy], Tylenol [acetaminophen], and Lisinopril  Review of Systems   Review of Systems  Constitutional:       Per HPI, otherwise negative  HENT:       Per HPI, otherwise negative  Respiratory:       Per HPI, otherwise negative  Cardiovascular:       Per HPI, otherwise  negative  Gastrointestinal: Negative for vomiting.  Endocrine:       Negative aside from HPI  Genitourinary:       Neg aside from HPI   Musculoskeletal:       Per HPI, otherwise negative  Skin: Negative.   Neurological: Positive for dizziness. Negative for syncope.    Physical Exam Updated Vital Signs BP (!) 169/110 (BP Location: Right Arm)   Pulse 94   Temp 98.1 F (36.7 C) (Oral)   Resp 20   SpO2 96%   Physical Exam Vitals and nursing note reviewed.  Constitutional:      General: He is not in acute distress.    Appearance: He is well-developed.  HENT:  Head: Normocephalic and atraumatic.  Eyes:     Extraocular Movements: EOM normal.     Conjunctiva/sclera: Conjunctivae normal.  Cardiovascular:     Rate and Rhythm: Normal rate and regular rhythm.  Pulmonary:     Effort: Pulmonary effort is normal. No respiratory distress.     Breath sounds: No stridor.  Abdominal:     General: There is no distension.  Musculoskeletal:        General: No edema.  Skin:    General: Skin is warm and dry.  Neurological:     Mental Status: He is alert and oriented to person, place, and time.     Cranial Nerves: Cranial nerves are intact.     Motor: No weakness, tremor, atrophy or abnormal muscle tone.     Coordination: Coordination normal.  Psychiatric:        Mood and Affect: Mood and affect normal.     ED Results / Procedures / Treatments   Labs (all labs ordered are listed, but only abnormal results are displayed) Labs Reviewed  CBG MONITORING, ED - Abnormal; Notable for the following components:      Result Value   Glucose-Capillary 207 (*)    All other components within normal limits  BASIC METABOLIC PANEL  CBC  URINALYSIS, ROUTINE W REFLEX MICROSCOPIC    EKG None  Radiology CT Head Wo Contrast  Result Date: 04/15/2020 CLINICAL DATA:  Head trauma. New word-finding difficulty and dizziness. EXAM: CT HEAD WITHOUT CONTRAST TECHNIQUE: Contiguous axial images were  obtained from the base of the skull through the vertex without intravenous contrast. COMPARISON:  None. FINDINGS: Brain: No evidence of acute large vascular territory infarction, hemorrhage, hydrocephalus, extra-axial collection or mass lesion/mass effect. Moderate white matter hypodensities. Vascular: No hyperdense vessel identified. Skull: No acute fracture. Sinuses/Orbits: Mild paranasal sinus mucosal thickening without air-fluid levels. No acute orbital abnormality. Other: No mastoid effusions. IMPRESSION: 1. No evidence of acute intracranial abnormality. 2. Moderate white matter hypodensities, most likely related to chronic microvascular ischemic disease. Electronically Signed   By: Feliberto Harts MD   On: 04/15/2020 13:00    Procedures Procedures   Medications Ordered in ED Medications - No data to display  ED Course  I have reviewed the triage vital signs and the nursing notes.  Pertinent labs & imaging results that were available during my care of the patient were reviewed by me and considered in my medical decision making (see chart for details).  After the initial evaluation I discussed possibilities for his dizziness, lightheadedness, following minor head trauma. Reviewed patient's chart, including recent evaluations, are largely reassuring. CT angiography reviewed, no evidence for pulmonary embolism, no pneumonia, there is evidence for ongoing COPD. We discussed indications for imaging, the patient will have CT scan performed.  1:31 PM Patient in no distress, hemodynamically unremarkable aside from mild hypertension. We discussed CT results, chart reassuring, no evidence for intracranial hemorrhage.  On their age appropriate changes, but with otherwise reassuring findings, no evidence for stroke, hemorrhage, no objective neurologic deficiencies, some suspicion for concussion the patient is discharged in stable condition.  Final Clinical Impression(s) / ED Diagnoses Final  diagnoses:  Dizziness    Rx / DC Orders ED Discharge Orders    None       Gerhard Munch, MD 04/15/20 1332

## 2020-05-06 ENCOUNTER — Inpatient Hospital Stay (HOSPITAL_COMMUNITY)
Admission: EM | Admit: 2020-05-06 | Discharge: 2020-05-09 | DRG: 190 | Disposition: A | Discharge status: Home / Self Care | Payer: Medicaid Other | Attending: Internal Medicine | Admitting: Internal Medicine

## 2020-05-06 ENCOUNTER — Encounter (HOSPITAL_COMMUNITY): Payer: Self-pay

## 2020-05-06 ENCOUNTER — Emergency Department (HOSPITAL_COMMUNITY): Payer: Medicaid Other

## 2020-05-06 DIAGNOSIS — R0602 Shortness of breath: Secondary | ICD-10-CM | POA: Diagnosis not present

## 2020-05-06 DIAGNOSIS — Z7951 Long term (current) use of inhaled steroids: Secondary | ICD-10-CM | POA: Diagnosis not present

## 2020-05-06 DIAGNOSIS — Z888 Allergy status to other drugs, medicaments and biological substances status: Secondary | ICD-10-CM

## 2020-05-06 DIAGNOSIS — I10 Essential (primary) hypertension: Secondary | ICD-10-CM | POA: Diagnosis present

## 2020-05-06 DIAGNOSIS — Z833 Family history of diabetes mellitus: Secondary | ICD-10-CM | POA: Diagnosis not present

## 2020-05-06 DIAGNOSIS — J9601 Acute respiratory failure with hypoxia: Secondary | ICD-10-CM | POA: Diagnosis not present

## 2020-05-06 DIAGNOSIS — J9621 Acute and chronic respiratory failure with hypoxia: Secondary | ICD-10-CM | POA: Diagnosis present

## 2020-05-06 DIAGNOSIS — Z8249 Family history of ischemic heart disease and other diseases of the circulatory system: Secondary | ICD-10-CM | POA: Diagnosis not present

## 2020-05-06 DIAGNOSIS — F17200 Nicotine dependence, unspecified, uncomplicated: Secondary | ICD-10-CM | POA: Diagnosis not present

## 2020-05-06 DIAGNOSIS — Z7982 Long term (current) use of aspirin: Secondary | ICD-10-CM | POA: Diagnosis not present

## 2020-05-06 DIAGNOSIS — D72829 Elevated white blood cell count, unspecified: Secondary | ICD-10-CM | POA: Diagnosis present

## 2020-05-06 DIAGNOSIS — E1165 Type 2 diabetes mellitus with hyperglycemia: Secondary | ICD-10-CM | POA: Diagnosis present

## 2020-05-06 DIAGNOSIS — J439 Emphysema, unspecified: Secondary | ICD-10-CM | POA: Diagnosis not present

## 2020-05-06 DIAGNOSIS — G8929 Other chronic pain: Secondary | ICD-10-CM | POA: Diagnosis present

## 2020-05-06 DIAGNOSIS — Z886 Allergy status to analgesic agent status: Secondary | ICD-10-CM | POA: Diagnosis not present

## 2020-05-06 DIAGNOSIS — Z6823 Body mass index (BMI) 23.0-23.9, adult: Secondary | ICD-10-CM | POA: Diagnosis not present

## 2020-05-06 DIAGNOSIS — M1990 Unspecified osteoarthritis, unspecified site: Secondary | ICD-10-CM | POA: Diagnosis present

## 2020-05-06 DIAGNOSIS — E119 Type 2 diabetes mellitus without complications: Secondary | ICD-10-CM

## 2020-05-06 DIAGNOSIS — E538 Deficiency of other specified B group vitamins: Secondary | ICD-10-CM | POA: Diagnosis present

## 2020-05-06 DIAGNOSIS — E440 Moderate protein-calorie malnutrition: Secondary | ICD-10-CM | POA: Diagnosis present

## 2020-05-06 DIAGNOSIS — Z7984 Long term (current) use of oral hypoglycemic drugs: Secondary | ICD-10-CM

## 2020-05-06 DIAGNOSIS — Z79899 Other long term (current) drug therapy: Secondary | ICD-10-CM | POA: Diagnosis not present

## 2020-05-06 DIAGNOSIS — Z91013 Allergy to seafood: Secondary | ICD-10-CM

## 2020-05-06 DIAGNOSIS — I152 Hypertension secondary to endocrine disorders: Secondary | ICD-10-CM | POA: Diagnosis present

## 2020-05-06 DIAGNOSIS — D509 Iron deficiency anemia, unspecified: Secondary | ICD-10-CM | POA: Diagnosis present

## 2020-05-06 DIAGNOSIS — J441 Chronic obstructive pulmonary disease with (acute) exacerbation: Principal | ICD-10-CM

## 2020-05-06 DIAGNOSIS — F17210 Nicotine dependence, cigarettes, uncomplicated: Secondary | ICD-10-CM | POA: Diagnosis present

## 2020-05-06 DIAGNOSIS — R739 Hyperglycemia, unspecified: Secondary | ICD-10-CM | POA: Diagnosis present

## 2020-05-06 HISTORY — DX: Type 2 diabetes mellitus without complications: E11.9

## 2020-05-06 HISTORY — DX: Nicotine dependence, unspecified, uncomplicated: F17.200

## 2020-05-06 HISTORY — DX: Other chronic pain: G89.29

## 2020-05-06 LAB — BASIC METABOLIC PANEL
Anion gap: 8 (ref 5–15)
BUN: 13 mg/dL (ref 6–20)
CO2: 29 mmol/L (ref 22–32)
Calcium: 9.2 mg/dL (ref 8.9–10.3)
Chloride: 101 mmol/L (ref 98–111)
Creatinine, Ser: 0.9 mg/dL (ref 0.61–1.24)
GFR, Estimated: >60 mL/min (ref >60)
Glucose, Bld: 149 mg/dL — ABNORMAL HIGH (ref 70–99)
Potassium: 3.8 mmol/L (ref 3.5–5.1)
Sodium: 138 mmol/L (ref 135–145)

## 2020-05-06 LAB — GLUCOSE, CAPILLARY
Glucose-Capillary: 214 mg/dL — ABNORMAL HIGH (ref 70–99)
Glucose-Capillary: 225 mg/dL — ABNORMAL HIGH (ref 70–99)

## 2020-05-06 LAB — TROPONIN I (HIGH SENSITIVITY)
Troponin I (High Sensitivity): 7 ng/L (ref <18)
Troponin I (High Sensitivity): 9 ng/L (ref <18)

## 2020-05-06 LAB — CBC WITH DIFFERENTIAL/PLATELET
Abs Immature Granulocytes: 0.04 10*3/uL (ref 0.00–0.07)
Basophils Absolute: 0.1 10*3/uL (ref 0.0–0.1)
Basophils Relative: 1 %
Eosinophils Absolute: 0.4 10*3/uL (ref 0.0–0.5)
Eosinophils Relative: 4 %
HCT: 46.1 % (ref 39.0–52.0)
Hemoglobin: 14.5 g/dL (ref 13.0–17.0)
Immature Granulocytes: 0 %
Lymphocytes Relative: 32 %
Lymphs Abs: 3.8 10*3/uL (ref 0.7–4.0)
MCH: 28.3 pg (ref 26.0–34.0)
MCHC: 31.5 g/dL (ref 30.0–36.0)
MCV: 90 fL (ref 80.0–100.0)
Monocytes Absolute: 1.1 10*3/uL — ABNORMAL HIGH (ref 0.1–1.0)
Monocytes Relative: 9 %
Neutro Abs: 6.6 10*3/uL (ref 1.7–7.7)
Neutrophils Relative %: 54 %
Platelets: 324 10*3/uL (ref 150–400)
RBC: 5.12 MIL/uL (ref 4.22–5.81)
RDW: 14.1 % (ref 11.5–15.5)
WBC: 12 10*3/uL — ABNORMAL HIGH (ref 4.0–10.5)
nRBC: 0 % (ref 0.0–0.2)

## 2020-05-06 MED ORDER — SODIUM CHLORIDE 0.9 % IV SOLN
500.0000 mg | INTRAVENOUS | Status: AC
  Administered 2020-05-06: 500 mg via INTRAVENOUS
  Filled 2020-05-06: qty 500

## 2020-05-06 MED ORDER — DOCUSATE SODIUM 100 MG PO CAPS
100.0000 mg | ORAL_CAPSULE | Freq: Two times a day (BID) | ORAL | Status: DC
  Administered 2020-05-06 – 2020-05-08 (×3): 100 mg via ORAL
  Filled 2020-05-06 (×6): qty 1

## 2020-05-06 MED ORDER — ALBUTEROL SULFATE HFA 108 (90 BASE) MCG/ACT IN AERS
8.0000 | INHALATION_SPRAY | Freq: Once | RESPIRATORY_TRACT | Status: AC
  Administered 2020-05-06: 8 via RESPIRATORY_TRACT
  Filled 2020-05-06: qty 6.7

## 2020-05-06 MED ORDER — HYDROXYZINE HCL 25 MG PO TABS: 25.0000 mg | ORAL_TABLET | Freq: Two times a day (BID) | ORAL | Status: DC | PRN

## 2020-05-06 MED ORDER — ALBUTEROL SULFATE (2.5 MG/3ML) 0.083% IN NEBU
2.5000 mg | INHALATION_SOLUTION | RESPIRATORY_TRACT | Status: DC | PRN
Start: 2020-05-06 — End: 2020-05-09
  Administered 2020-05-06: 2.5 mg via RESPIRATORY_TRACT
  Filled 2020-05-06: qty 3

## 2020-05-06 MED ORDER — ENOXAPARIN SODIUM 40 MG/0.4ML ~~LOC~~ SOLN
40.0000 mg | SUBCUTANEOUS | Status: DC
  Filled 2020-05-06: qty 0.4

## 2020-05-06 MED ORDER — PREDNISONE 10 MG (21) PO TBPK: ORAL_TABLET | Freq: Every day | ORAL | 0 refills | Status: DC

## 2020-05-06 MED ORDER — DIPHENHYDRAMINE HCL 50 MG/ML IJ SOLN: 25.0000 mg | Freq: Four times a day (QID) | INTRAMUSCULAR | Status: DC | PRN

## 2020-05-06 MED ORDER — HYDROCHLOROTHIAZIDE 25 MG PO TABS
12.5000 mg | ORAL_TABLET | Freq: Every day | ORAL | Status: DC
  Administered 2020-05-07 – 2020-05-08 (×2): 12.5 mg via ORAL
  Filled 2020-05-06 (×4): qty 1

## 2020-05-06 MED ORDER — METHYLPREDNISOLONE SODIUM SUCC 125 MG IJ SOLR
60.0000 mg | Freq: Two times a day (BID) | INTRAMUSCULAR | Status: AC
Start: 2020-05-06 — End: 2020-05-06
  Administered 2020-05-06 (×2): 60 mg via INTRAVENOUS
  Filled 2020-05-06 (×2): qty 2

## 2020-05-06 MED ORDER — DIPHENHYDRAMINE HCL 25 MG PO CAPS: 25.0000 mg | ORAL_CAPSULE | Freq: Four times a day (QID) | ORAL | Status: DC | PRN

## 2020-05-06 MED ORDER — NICOTINE 14 MG/24HR TD PT24
14.0000 mg | MEDICATED_PATCH | Freq: Every day | TRANSDERMAL | Status: DC
  Administered 2020-05-06 – 2020-05-09 (×4): 14 mg via TRANSDERMAL
  Filled 2020-05-06 (×4): qty 1

## 2020-05-06 MED ORDER — AZITHROMYCIN 250 MG PO TABS
500.0000 mg | ORAL_TABLET | Freq: Every day | ORAL | Status: DC
  Administered 2020-05-07 – 2020-05-09 (×3): 500 mg via ORAL
  Filled 2020-05-06 (×3): qty 2

## 2020-05-06 MED ORDER — GUAIFENESIN ER 600 MG PO TB12
600.0000 mg | ORAL_TABLET | Freq: Two times a day (BID) | ORAL | Status: DC | PRN
  Administered 2020-05-08: 600 mg via ORAL
  Filled 2020-05-06: qty 1

## 2020-05-06 MED ORDER — NICOTINE 14 MG/24HR TD PT24: 14.0000 mg | MEDICATED_PATCH | Freq: Every day | TRANSDERMAL | 0 refills | Status: DC

## 2020-05-06 MED ORDER — UMECLIDINIUM-VILANTEROL 62.5-25 MCG/INH IN AEPB
1.0000 | INHALATION_SPRAY | Freq: Every day | RESPIRATORY_TRACT | Status: DC
  Administered 2020-05-07 – 2020-05-09 (×3): 1 via RESPIRATORY_TRACT
  Filled 2020-05-06: qty 14

## 2020-05-06 MED ORDER — INSULIN ASPART 100 UNIT/ML ~~LOC~~ SOLN
0.0000 [IU] | Freq: Three times a day (TID) | SUBCUTANEOUS | Status: DC
  Administered 2020-05-06: 2 [IU] via SUBCUTANEOUS
  Administered 2020-05-07 (×2): 5 [IU] via SUBCUTANEOUS
  Administered 2020-05-08 – 2020-05-09 (×2): 3 [IU] via SUBCUTANEOUS

## 2020-05-06 MED ORDER — OXYCODONE HCL 5 MG PO TABS
15.0000 mg | ORAL_TABLET | Freq: Four times a day (QID) | ORAL | Status: DC | PRN
  Administered 2020-05-06 – 2020-05-09 (×11): 15 mg via ORAL
  Filled 2020-05-06 (×11): qty 3

## 2020-05-06 MED ORDER — ESCITALOPRAM OXALATE 10 MG PO TABS
10.0000 mg | ORAL_TABLET | Freq: Every day | ORAL | Status: DC
  Administered 2020-05-07 – 2020-05-08 (×2): 10 mg via ORAL
  Filled 2020-05-06 (×3): qty 1

## 2020-05-06 MED ORDER — AMLODIPINE BESYLATE 10 MG PO TABS
10.0000 mg | ORAL_TABLET | Freq: Every day | ORAL | Status: DC
  Administered 2020-05-07 – 2020-05-09 (×3): 10 mg via ORAL
  Filled 2020-05-06 (×3): qty 1

## 2020-05-06 MED ORDER — LACTATED RINGERS IV SOLN
INTRAVENOUS | Status: AC
  Administered 2020-05-06: 1000 mL via INTRAVENOUS

## 2020-05-06 MED ORDER — PANTOPRAZOLE SODIUM 40 MG PO TBEC
40.0000 mg | DELAYED_RELEASE_TABLET | Freq: Every day | ORAL | Status: DC
  Administered 2020-05-07 – 2020-05-09 (×3): 40 mg via ORAL
  Filled 2020-05-06 (×3): qty 1

## 2020-05-06 MED ORDER — IPRATROPIUM-ALBUTEROL 0.5-2.5 (3) MG/3ML IN SOLN
3.0000 mL | Freq: Once | RESPIRATORY_TRACT | Status: AC
  Administered 2020-05-06: 3 mL via RESPIRATORY_TRACT
  Filled 2020-05-06: qty 3

## 2020-05-06 MED ORDER — ONDANSETRON HCL 4 MG/2ML IJ SOLN: 4.0000 mg | Freq: Four times a day (QID) | INTRAMUSCULAR | Status: DC | PRN

## 2020-05-06 MED ORDER — MAGNESIUM SULFATE 2 GM/50ML IV SOLN
2.0000 g | Freq: Once | INTRAVENOUS | Status: AC
  Administered 2020-05-06: 2 g via INTRAVENOUS
  Filled 2020-05-06: qty 50

## 2020-05-06 MED ORDER — IPRATROPIUM-ALBUTEROL 0.5-2.5 (3) MG/3ML IN SOLN
3.0000 mL | Freq: Four times a day (QID) | RESPIRATORY_TRACT | Status: DC
  Administered 2020-05-06 – 2020-05-07 (×2): 3 mL via RESPIRATORY_TRACT
  Filled 2020-05-06 (×2): qty 3

## 2020-05-06 MED ORDER — ONDANSETRON HCL 4 MG PO TABS
4.0000 mg | ORAL_TABLET | Freq: Four times a day (QID) | ORAL | Status: DC | PRN
  Administered 2020-05-07: 4 mg via ORAL
  Filled 2020-05-06 (×3): qty 1

## 2020-05-06 MED ORDER — SODIUM CHLORIDE 0.9% FLUSH
3.0000 mL | Freq: Two times a day (BID) | INTRAVENOUS | Status: DC
  Administered 2020-05-06 – 2020-05-09 (×6): 3 mL via INTRAVENOUS

## 2020-05-06 MED ORDER — SODIUM CHLORIDE 0.9 % IV BOLUS
1000.0000 mL | Freq: Once | INTRAVENOUS | Status: AC
  Administered 2020-05-06: 1000 mL via INTRAVENOUS

## 2020-05-06 MED ORDER — HYDRALAZINE HCL 20 MG/ML IJ SOLN: 5.0000 mg | INTRAMUSCULAR | Status: DC | PRN

## 2020-05-06 MED ORDER — LUBIPROSTONE 24 MCG PO CAPS
24.0000 ug | ORAL_CAPSULE | Freq: Every day | ORAL | Status: DC
  Administered 2020-05-06 – 2020-05-08 (×2): 24 ug via ORAL
  Filled 2020-05-06 (×4): qty 1

## 2020-05-06 MED ORDER — IPRATROPIUM BROMIDE HFA 17 MCG/ACT IN AERS
2.0000 | INHALATION_SPRAY | Freq: Once | RESPIRATORY_TRACT | Status: AC
  Administered 2020-05-06: 2 via RESPIRATORY_TRACT
  Filled 2020-05-06: qty 12.9

## 2020-05-06 MED ORDER — AEROCHAMBER PLUS FLO-VU MEDIUM MISC
1.0000 | Freq: Once | Status: AC
  Administered 2020-05-06: 1
  Filled 2020-05-06: qty 1

## 2020-05-06 MED ORDER — TAMSULOSIN HCL 0.4 MG PO CAPS
0.4000 mg | ORAL_CAPSULE | Freq: Every evening | ORAL | Status: DC
  Administered 2020-05-06 – 2020-05-08 (×3): 0.4 mg via ORAL
  Filled 2020-05-06 (×3): qty 1

## 2020-05-06 MED ORDER — POLYETHYLENE GLYCOL 3350 17 G PO PACK: 17.0000 g | PACK | Freq: Every day | ORAL | Status: DC | PRN

## 2020-05-06 MED ORDER — METAXALONE 800 MG PO TABS
800.0000 mg | ORAL_TABLET | Freq: Three times a day (TID) | ORAL | Status: DC | PRN
  Filled 2020-05-06: qty 1

## 2020-05-06 MED ORDER — PREDNISONE 20 MG PO TABS
40.0000 mg | ORAL_TABLET | Freq: Every day | ORAL | Status: DC
  Administered 2020-05-07 – 2020-05-09 (×3): 40 mg via ORAL
  Filled 2020-05-06 (×3): qty 2

## 2020-05-06 MED ORDER — ASPIRIN EC 81 MG PO TBEC
81.0000 mg | DELAYED_RELEASE_TABLET | Freq: Every day | ORAL | Status: DC
  Administered 2020-05-07 – 2020-05-09 (×3): 81 mg via ORAL
  Filled 2020-05-06 (×3): qty 1

## 2020-05-06 MED ORDER — GABAPENTIN 300 MG PO CAPS
300.0000 mg | ORAL_CAPSULE | Freq: Three times a day (TID) | ORAL | Status: DC
  Administered 2020-05-06 – 2020-05-09 (×9): 300 mg via ORAL
  Filled 2020-05-06 (×9): qty 1

## 2020-05-06 MED ORDER — LORATADINE 10 MG PO TABS
10.0000 mg | ORAL_TABLET | Freq: Every day | ORAL | Status: DC
  Administered 2020-05-07 – 2020-05-09 (×3): 10 mg via ORAL
  Filled 2020-05-06 (×3): qty 1

## 2020-05-06 MED ORDER — BISACODYL 5 MG PO TBEC: 5.0000 mg | DELAYED_RELEASE_TABLET | Freq: Every day | ORAL | Status: DC | PRN

## 2020-05-06 NOTE — ED Notes (Signed)
Attempted to call report to floor. RN informed pt hasn't been "approved" yet

## 2020-05-06 NOTE — ED Triage Notes (Signed)
Pt BIB EMS from home due to sob. Pt has h/o copd , hypertension. When EMS arrived he was 60% on RA. EMS tried bipap but refused. Pt is 99% on 4L after solumedrol and 1 duo-neb given

## 2020-05-06 NOTE — ED Notes (Addendum)
Attempted to call report to floor again

## 2020-05-06 NOTE — ED Provider Notes (Signed)
[facility] Provider Note   CSN: [id number] Arrival date & time: 05/06/20  [id number]     History Chief Complaint  Patient presents with  . Shortness of Breath    Glenn [name] is a 60 y.o. male.  HPI Patient is a 60 year old male with a past medical history significant for COPD, HTN, asthma, arthritis  Patient is presented to the ER with shortness of breath that he states started today.  He states he felt well yesterday.  Denies any fevers, chills, cough, congestion.  He states that he has no chest pain or chest tightness.  Patient is an active smoker. Patient was recently admitted 3/5 and discharged 3/7 for COPD exacerbation.  He was sent home with prednisone taper.  At that time patient had elevated D-dimer and negative CT angio for PE and lower extremity Dopplers were negative for DVT.      Past Medical History:  Diagnosis Date  . Arthritis   . Asthma   . Chronic pain 05/06/2020  . COPD (chronic obstructive pulmonary disease) (HCC)   . Diabetes mellitus type 2 in nonobese (HCC)   . ED (erectile dysfunction)   . Hypertension   . Tobacco dependence 05/06/2020    Patient Active Problem List   Diagnosis Date Noted  . Chronic pain 05/06/2020  . Tobacco dependence 05/06/2020  . Hypertension   . Diabetes mellitus type 2 in nonobese (HCC)   . COPD (chronic obstructive pulmonary disease) (HCC) 04/11/2020  . Elevated troponin 04/11/2020  . Acute on chronic respiratory failure with hypoxia (HCC) 04/11/2020  . Elevated transaminase level   . COPD with acute exacerbation (HCC) 06/30/2019  . Acute respiratory failure with hypoxemia (HCC) 06/30/2019  . Hypotension 06/30/2019  . Near syncope 06/30/2019  . Pleural nodule 06/30/2019  . Colon cancer screening 12/18/2017  . History of colonic polyps 12/18/2017  . 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 COPD GOLD III/ actively smoking  10/05/2016    Past Surgical History:  Procedure Laterality Date  .  LUNG SURGERY     removed tissue       Family History  Problem Relation Age of Onset  . Hypertension Mother   . Diabetes Maternal Grandmother   . Colon cancer Neg Hx   . Esophageal cancer Neg Hx   . Rectal cancer Neg Hx   . Inflammatory bowel disease Neg Hx   . Liver disease Neg Hx   . Pancreatic cancer Neg Hx     Social History   Tobacco Use  . Smoking status: Current Every Day Smoker    Packs/day: 0.50    Years: 34.00    Pack years: 17.00    Types: Cigarettes  . Smokeless tobacco: Never Used  Vaping Use  . Vaping Use: Never used  Substance Use Topics  . Alcohol use: Never  . Drug use: Never    Home Medications Prior to Admission medications   Medication Sig Start Date End Date Taking? Authorizing Provider  albuterol (PROVENTIL HFA) 108 (90 Base) MCG/ACT inhaler Inhale 2 puffs into the lungs every 4 (four) hours as needed for wheezing or shortness of breath. 11/20/18  Yes Nyoka CowdenWert, Michael B, MD  albuterol (PROVENTIL) (2.5 MG/3ML) 0.083% nebulizer solution PLEASE SEE ATTACHED FOR DETAILED DIRECTIONS Patient taking differently: Take 2.5 mg by nebulization every 6 (six) hours as needed for wheezing or shortness of breath. 02/26/18  Yes Nyoka CowdenWert, Michael B, MD  AMITIZA 24 MCG capsule Take 24 mcg by mouth at bedtime. 03/31/20  Yes [provider]  amLODipine (NORVASC) 10 MG tablet Take 10 mg by mouth daily.   Yes [provider]  ASPIRIN LOW DOSE 81 MG EC tablet Take 81 mg by mouth daily. 04/29/19  Yes [provider]  budesonide-formoterol (SYMBICORT) 160-4.5 MCG/ACT inhaler Inhale 2 puffs into the lungs 2 (two) times daily. 05/13/19  Yes [name], MD  cetirizine (ZYRTEC ALLERGY) 10 MG tablet Take 1 tablet (10 mg total) by mouth 2 (two) times daily as needed for allergies (swelling). 04/14/20  Yes Benjiman Core, MD  escitalopram (LEXAPRO) 10 MG tablet Take 10 mg by mouth daily. 03/10/20  Yes [provider]  gabapentin (NEURONTIN) 300 MG  capsule Take 1 capsule (300 mg total) by mouth 3 (three) times daily. 02/09/18  Yes Eustace Moore, MD  hydrochlorothiazide (HYDRODIURIL) 12.5 MG tablet Take 12.5 mg by mouth daily. 03/26/20  Yes [provider]  hydrOXYzine (ATARAX/VISTARIL) 25 MG tablet Take 25 mg by mouth 2 (two) times daily as needed for anxiety. 04/14/20  Yes [provider]  metaxalone (SKELAXIN) 800 MG tablet Take 1 tablet (800 mg total) by mouth 3 (three) times daily. Patient taking differently: Take 800 mg by mouth 3 (three) times daily as needed for muscle spasms. 01/30/20  Yes Rhys Martini, PA-C  metFORMIN (GLUCOPHAGE) 500 MG tablet Take 500 mg by mouth every morning. 04/17/20  Yes [provider]  nicotine (NICODERM CQ) 14 mg/24hr patch Place 1 patch (14 mg total) onto the skin daily. 05/06/20  Yes Jadae Steinke S, PA  nitroGLYCERIN (NITROSTAT) 0.4 MG SL tablet Place 0.4 mg under the tongue every 5 (five) minutes as needed for chest pain.   Yes [provider]  oxyCODONE (ROXICODONE) 15 MG immediate release tablet Take 15 mg by mouth every 6 (six) hours as needed for pain. 03/31/20  Yes [provider]  pantoprazole (PROTONIX) 40 MG tablet Take 1 tablet (40 mg total) by mouth daily. Patient taking differently: Take 40 mg by mouth daily as needed (stomach acid). 07/02/19  Yes Elgergawy, Leana Roe, MD  predniSONE (STERAPRED UNI-PAK 21 TAB) 10 MG (21) TBPK tablet Take by mouth daily. Take 6 tabs by mouth daily  for 2 days, then 5 tabs for 2 days, then 4 tabs for 2 days, then 3 tabs for 2 days, 2 tabs for 2 days, then 1 tab by mouth daily for 2 days 05/06/20  Yes Zhamir Pirro S, PA  sildenafil (VIAGRA) 100 MG tablet Take 100 mg by mouth daily as needed for erectile dysfunction.   Yes [provider]  tamsulosin (FLOMAX) 0.4 MG CAPS capsule Take 0.4 mg by mouth every evening. 06/26/19  Yes [provider]  Tiotropium Bromide Monohydrate (SPIRIVA RESPIMAT) 2.5 MCG/ACT  AERS 2 puffs each am 01/15/20  Yes [name], MD  Vitamin D, Ergocalciferol, (DRISDOL) 1.25 MG (50000 UNIT) CAPS capsule Take 50,000 Units by mouth once a week. Mondays 05/02/19  Yes [provider]  glipiZIDE (GLUCOTROL) 5 MG tablet Take 0.5 tablets (2.5 mg total) by mouth 2 (two) times daily for 20 days. 04/13/20 05/03/20  Regalado, Jon Billings A, MD    Allergies    Ibuprofen, Shrimp [shellfish allergy], Tylenol [acetaminophen], and Lisinopril  Review of Systems   Review of Systems  Constitutional: Positive for fatigue. Negative for chills and fever.  HENT: Negative for congestion.   Eyes: Negative for pain.  Respiratory: Positive for chest tightness and shortness of breath. Negative for cough.   Cardiovascular:  Negative for chest pain and leg swelling.  Gastrointestinal: Negative for abdominal pain, diarrhea, nausea and vomiting.  Genitourinary: Negative for dysuria.  Musculoskeletal: Negative for myalgias.  Skin: Negative for rash.  Neurological: Negative for dizziness and headaches.    Physical Exam Updated Vital Signs BP (!) 133/91   Pulse (!) 103   Temp (!) 97.4 F (36.3 C) (Oral)   Resp (!) 22   SpO2 92%   Physical Exam Vitals and nursing note reviewed.  Constitutional:      Comments: Increased work of breathing, tachypneic  HENT:     Head: Normocephalic and atraumatic.     Nose: Nose normal.  Eyes:     General: No scleral icterus. Neck:     Comments: No JVD Cardiovascular:     Rate and Rhythm: Normal rate and regular rhythm.     Pulses: Normal pulses.     Heart sounds: Normal heart sounds.  Pulmonary:     Effort: Pulmonary effort is normal. No respiratory distress.     Breath sounds: Wheezing present.     Comments: Tachypnea, mild increased work of breathing, wheezing present Abdominal:     Palpations: Abdomen is soft.     Tenderness: There is no abdominal tenderness.  Musculoskeletal:     Cervical back: Normal range of motion.     Right lower leg:  No edema.     Left lower leg: No edema.  Skin:    General: Skin is warm and dry.     Capillary Refill: Capillary refill takes less than 2 seconds.  Neurological:     Mental Status: He is alert. Mental status is at baseline.  Psychiatric:        Mood and Affect: Mood normal.        Behavior: Behavior normal.     ED Results / Procedures / Treatments   Labs (all labs ordered are listed, but only abnormal results are displayed) Labs Reviewed  BASIC METABOLIC PANEL - Abnormal; Notable for the following components:      Result Value   Glucose, Bld 149 (*)    All other components within normal limits  CBC WITH DIFFERENTIAL/PLATELET - Abnormal; Notable for the following components:   WBC 12.0 (*)    Monocytes Absolute 1.1 (*)    All other components within normal limits  RESP PANEL BY RT-PCR (FLU A&B, COVID) ARPGX2  TROPONIN I (HIGH SENSITIVITY)  TROPONIN I (HIGH SENSITIVITY)    EKG None  Radiology DG Chest Port 1 View  Result Date: 05/06/2020 CLINICAL DATA:  Shortness of breath. EXAM: PORTABLE CHEST 1 VIEW COMPARISON:  Chest x-ray 04/11/2020, 11/20/2018. CT 04/13/2020 and 06/30/2019. FINDINGS: Bilateral postsurgical changes and noted. Mediastinum is normal. Heart size normal. Chronic bilateral perihilar changes of bronchiectasis and scarring. Bilateral pleural-parenchymal thickening consistent scarring again noted. No pleural effusion or pneumothorax. Stable elevation left hemidiaphragm. No acute bony abnormality identified. IMPRESSION: Bilateral postsurgical changes again noted. Chronic bilateral perihilar changes of bronchiectasis and scarring. Bilateral pleural-parenchymal thickening consistent with scarring again noted. Electronically Signed   By: Maisie Fus  Register   On: 05/06/2020 09:27    Procedures .Critical Care Performed by: Gailen Shelter, PA Authorized by: Gailen Shelter, PA   Critical care provider statement:    Critical care time (minutes):  35   Critical care  time was exclusive of:  Separately billable procedures and treating other patients and teaching time   Critical care was necessary to treat or prevent imminent or life-threatening deterioration of the  following conditions:  Respiratory failure   Critical care was time spent personally by me on the following activities:  Discussions with consultants, evaluation of patient's response to treatment, examination of patient, review of old charts, re-evaluation of patient's condition, pulse oximetry, ordering and review of radiographic studies, ordering and review of laboratory studies and ordering and performing treatments and interventions   I assumed direction of critical care for this patient from another provider in my specialty: no       Medications Ordered in ED Medications  aspirin EC tablet 81 mg (has no administration in time range)  oxyCODONE (Oxy IR/ROXICODONE) immediate release tablet 15 mg (has no administration in time range)  amLODipine (NORVASC) tablet 10 mg (has no administration in time range)  hydrochlorothiazide (HYDRODIURIL) tablet 12.5 mg (has no administration in time range)  escitalopram (LEXAPRO) tablet 10 mg (10 mg Oral Not Given 05/06/20 1312)  hydrOXYzine (ATARAX/VISTARIL) tablet 25 mg (has no administration in time range)  lubiprostone (AMITIZA) capsule 24 mcg (has no administration in time range)  pantoprazole (PROTONIX) EC tablet 40 mg (has no administration in time range)  tamsulosin (FLOMAX) capsule 0.4 mg (has no administration in time range)  gabapentin (NEURONTIN) capsule 300 mg (has no administration in time range)  metaxalone (SKELAXIN) tablet 800 mg (has no administration in time range)  loratadine (CLARITIN) tablet 10 mg (0 mg Oral Hold 05/06/20 1315)  azithromycin (ZITHROMAX) 500 mg in sodium chloride 0.9 % 250 mL IVPB (0 mg Intravenous Stopped 05/06/20 1511)    Followed by  azithromycin (ZITHROMAX) tablet 500 mg (has no administration in time range)   methylPREDNISolone sodium succinate (SOLU-MEDROL) 125 mg/2 mL injection 60 mg (60 mg Intravenous Given 05/06/20 1239)    Followed by  predniSONE (DELTASONE) tablet 40 mg (has no administration in time range)  ipratropium-albuterol (DUONEB) 0.5-2.5 (3) MG/3ML nebulizer solution 3 mL (3 mLs Nebulization Not Given 05/06/20 1313)  albuterol (PROVENTIL) (2.5 MG/3ML) 0.083% nebulizer solution 2.5 mg (has no administration in time range)  enoxaparin (LOVENOX) injection 40 mg (40 mg Subcutaneous Not Given 05/06/20 1312)  sodium chloride flush (NS) 0.9 % injection 3 mL (0 mLs Intravenous Hold 05/06/20 1314)  lactated ringers infusion (0 mLs Intravenous Hold 05/06/20 1315)  docusate sodium (COLACE) capsule 100 mg (100 mg Oral Not Given 05/06/20 1310)  polyethylene glycol (MIRALAX / GLYCOLAX) packet 17 g (has no administration in time range)  bisacodyl (DULCOLAX) EC tablet 5 mg (has no administration in time range)  ondansetron (ZOFRAN) tablet 4 mg (has no administration in time range)    Or  ondansetron (ZOFRAN) injection 4 mg (has no administration in time range)  guaiFENesin (MUCINEX) 12 hr tablet 600 mg (has no administration in time range)  nicotine (NICODERM CQ - dosed in mg/24 hours) patch 14 mg (0 mg Transdermal Hold 05/06/20 1314)  hydrALAZINE (APRESOLINE) injection 5 mg (has no administration in time range)  insulin aspart (novoLOG) injection 0-15 Units (has no administration in time range)  umeclidinium-vilanterol (ANORO ELLIPTA) 62.5-25 MCG/INH 1 puff (0 puffs Inhalation Hold 05/06/20 1315)  magnesium sulfate IVPB 2 g 50 mL (0 g Intravenous Stopped 05/06/20 1015)  albuterol (VENTOLIN HFA) 108 (90 Base) MCG/ACT inhaler 8 puff (8 puffs Inhalation Given 05/06/20 0903)  ipratropium (ATROVENT HFA) inhaler 2 puff (2 puffs Inhalation Given 05/06/20 0903)  sodium chloride 0.9 % bolus 1,000 mL (0 mLs Intravenous Stopped 05/06/20 1112)  AeroChamber Plus Flo-Vu Medium MISC 1 each (1 each Other Given 05/06/20 0951)   ipratropium-albuterol (DUONEB) 0.5-2.5 (  3) MG/3ML nebulizer solution 3 mL (3 mLs Nebulization Given 05/06/20 1118)    ED Course  I have reviewed the triage vital signs and the nursing notes.  Pertinent labs & imaging results that were available during my care of the patient were reviewed by me and considered in my medical decision making (see chart for details).    MDM Rules/Calculators/A&P                          Patient is 60 year old male discharged from hospital for COPD exacerbation earlier this month states that he woke up this morning feeling short of breath.  His symptoms have progressively worsened.  By the time EMS arrived patient was hypoxic with SPO2 60% on room air.  EMS states SPO2 improved on oxygen, Solu-Medrol and after 1 DuoNeb.  He is currently on 4 L.  This patient is on oxygen at home.  Given patient's wheezing and tachypnea and hypoxia I suspect he will need admission.  Will provide patient with albuterol, ipratropium, magnesium and reassess.  Chest x-ray without any focal infiltrates.  There is chronic scarring and chronic abnormalities on chest x-ray which do not appear to be significantly changed from prior.  EKG is nonischemic but  tachycardia is present.  CBC with mild leukocytosis.  No anemia.  BMP unremarkable.  Troponin x1 within normal limits  Was able to wean patient to 2 L nasal cannula.  Still wheezing.  Will provide with additional duo nebs and consult for admission for hypoxic respiratory failure secondary to COPD exacerbation.  Final Clinical Impression(s) / ED Diagnoses Final diagnoses:  COPD exacerbation (HCC)    Rx / DC Orders ED Discharge Orders         Ordered    predniSONE (STERAPRED UNI-PAK 21 TAB) 10 MG (21) TBPK tablet  Daily        05/06/20 1004    nicotine (NICODERM CQ) 14 mg/24hr patch  Daily        05/06/20 1004           Solon Augusta Demorest, [place] 05/06/20 1621    Blane Ohara, MD 05/09/20 1018

## 2020-05-06 NOTE — ED Notes (Signed)
Report called to [facility], RN of [phone]

## 2020-05-06 NOTE — H&P (Signed)
History and Physical    [name] OJJ:[id number] DOB: 1960-11-27 DOA: 05/06/2020  PCP: Fleet Contras, MD Consultants:  Sherene Sires - pulmnology Patient coming from:  Home - lives with significant other, [name]; NOK: [name], [phone]  Chief Complaint: Respiratory distress  HPI: Glenn [name] is a 60 y.o. male with medical history significant of HTN; DM; and COPD (s/p B lung wedge resection in 2011) presenting with respiratory distress.  He was last admitted from 3/5-7 for COPD exacerbation and new diagnosis of DM (A1c 6.6).   He reports that since d/c he continues to have nocturnal SOB and wheezing periodically, using his neb machine maybe several times a week.  He has an upcoming appointment with an allergist in May.  He thinks his neb machine is not working well and that he needs a new one.  His pulmonologist has suggested that his nocturnal dyspnea is associated with decreased lung capacity.  Overnight last night, he became acutely SOB.  He tried his neb machine in the house as well as his neb machine for his car and also MDIs without improvement and so finally called 911.  Denies other symptoms, felt well yesterday.  He continues to smoke up to 1/2 ppd.    ED Course:  Discharged recently.  SOB today, 60% on RA and not on home O2.  Given Solumedrol and Duoneb.  Now on 4L.  Given Mag++, inhalers, IVF with some improvement and now on 2L.  Still smoking, ?motivated to quit.  Review of Systems: As per HPI; otherwise review of systems reviewed and negative.     Past Medical History:  Diagnosis Date  . Arthritis   . Asthma   . Chronic pain 05/06/2020  . COPD (chronic obstructive pulmonary disease) (HCC)   . Diabetes mellitus type 2 in nonobese (HCC)   . ED (erectile dysfunction)   . Hypertension   . Tobacco dependence 05/06/2020    Past Surgical History:  Procedure Laterality Date  . LUNG SURGERY     removed tissue    Social History   Socioeconomic History  . Marital  status: Married    Spouse name: Not on file  . Number of children: Not on file  . Years of education: Not on file  . Highest education level: Not on file  Occupational History  . Not on file  Tobacco Use  . Smoking status: Current Every Day Smoker    Packs/day: 0.50    Years: 34.00    Pack years: 17.00    Types: Cigarettes  . Smokeless tobacco: Never Used  Vaping Use  . Vaping Use: Never used  Substance and Sexual Activity  . Alcohol use: Never  . Drug use: Never  . Sexual activity: Not on file  Other Topics Concern  . Not on file  Social History Narrative  . Not on file   Social Determinants of Health   Financial Resource Strain: Not on file  Food Insecurity: Not on file  Transportation Needs: Not on file  Physical Activity: Not on file  Stress: Not on file  Social Connections: Not on file  Intimate Partner Violence: Not on file    Allergies  Allergen Reactions  . Ibuprofen     Abdominal sensitivity  . Shrimp [Shellfish Allergy] Itching  . Tylenol [Acetaminophen]     Abdominal sensitvity  . Lisinopril Rash    Tongue swelling    Family History  Problem Relation Age of Onset  . Hypertension Mother   . Diabetes Maternal  Grandmother   . Colon cancer Neg Hx   . Esophageal cancer Neg Hx   . Rectal cancer Neg Hx   . Inflammatory bowel disease Neg Hx   . Liver disease Neg Hx   . Pancreatic cancer Neg Hx     Prior to Admission medications   Medication Sig Start Date End Date Taking? Authorizing Provider  albuterol (PROVENTIL HFA) 108 (90 Base) MCG/ACT inhaler Inhale 2 puffs into the lungs every 4 (four) hours as needed for wheezing or shortness of breath. 11/20/18  Yes [name], MD  albuterol (PROVENTIL) (2.5 MG/3ML) 0.083% nebulizer solution PLEASE SEE ATTACHED FOR DETAILED DIRECTIONS Patient taking differently: Take 2.5 mg by nebulization every 6 (six) hours as needed for wheezing or shortness of breath. 02/26/18  Yes [name], MD  AMITIZA 24 MCG  capsule Take 24 mcg by mouth at bedtime. 03/31/20  Yes [provider]  amLODipine (NORVASC) 10 MG tablet Take 10 mg by mouth daily.   Yes [provider]  amoxicillin (AMOXIL) 500 MG tablet Take 500 mg by mouth 3 (three) times daily. 04/27/20  Yes [provider]  ASPIRIN LOW DOSE 81 MG EC tablet Take 81 mg by mouth daily. 04/29/19  Yes [provider]  budesonide-formoterol (SYMBICORT) 160-4.5 MCG/ACT inhaler Inhale 2 puffs into the lungs 2 (two) times daily. 05/13/19  Yes [name], MD  cetirizine (ZYRTEC ALLERGY) 10 MG tablet Take 1 tablet (10 mg total) by mouth 2 (two) times daily as needed for allergies (swelling). 04/14/20  Yes Benjiman Core, MD  escitalopram (LEXAPRO) 10 MG tablet Take 10 mg by mouth daily. 03/10/20  Yes [provider]  gabapentin (NEURONTIN) 300 MG capsule Take 1 capsule (300 mg total) by mouth 3 (three) times daily. 02/09/18  Yes Eustace Moore, MD  hydrochlorothiazide (HYDRODIURIL) 12.5 MG tablet Take 12.5 mg by mouth daily. 03/26/20  Yes [provider]  hydrOXYzine (ATARAX/VISTARIL) 25 MG tablet Take 25 mg by mouth 2 (two) times daily as needed for anxiety. 04/14/20  Yes [provider]  metaxalone (SKELAXIN) 800 MG tablet Take 1 tablet (800 mg total) by mouth 3 (three) times daily. Patient taking differently: Take 800 mg by mouth 3 (three) times daily as needed for muscle spasms. 01/30/20  Yes Rhys Martini, PA-C  metFORMIN (GLUCOPHAGE) 500 MG tablet Take 500 mg by mouth every morning. 04/17/20  Yes [provider]  nicotine (NICODERM CQ) 14 mg/24hr patch Place 1 patch (14 mg total) onto the skin daily. 05/06/20  Yes Fondaw, Wylder S, PA  nitroGLYCERIN (NITROSTAT) 0.4 MG SL tablet Place 0.4 mg under the tongue every 5 (five) minutes as needed for chest pain.   Yes [provider]  oxyCODONE (ROXICODONE) 15 MG immediate release tablet Take 15 mg by mouth every 6 (six) hours as needed for  pain. 03/31/20  Yes [provider]  pantoprazole (PROTONIX) 40 MG tablet Take 1 tablet (40 mg total) by mouth daily. Patient taking differently: Take 40 mg by mouth daily as needed (stomach acid). 07/02/19  Yes Elgergawy, Leana Roe, MD  predniSONE (STERAPRED UNI-PAK 21 TAB) 10 MG (21) TBPK tablet Take by mouth daily. Take 6 tabs by mouth daily  for 2 days, then 5 tabs for 2 days, then 4 tabs for 2 days, then 3 tabs for 2 days, 2 tabs for 2 days, then 1 tab by mouth daily for 2 days 05/06/20  Yes Fondaw, Wylder S, PA  sildenafil (VIAGRA) 100 MG  tablet Take 100 mg by mouth daily as needed for erectile dysfunction.   Yes [provider]  tamsulosin (FLOMAX) 0.4 MG CAPS capsule Take 0.4 mg by mouth every evening. 06/26/19  Yes [provider]  Tiotropium Bromide Monohydrate (SPIRIVA RESPIMAT) 2.5 MCG/ACT AERS 2 puffs each am 01/15/20  Yes [name], MD  Vitamin D, Ergocalciferol, (DRISDOL) 1.25 MG (50000 UNIT) CAPS capsule Take 50,000 Units by mouth once a week. Mondays 05/02/19  Yes [provider]  glipiZIDE (GLUCOTROL) 5 MG tablet Take 0.5 tablets (2.5 mg total) by mouth 2 (two) times daily for 20 days. 04/13/20 05/03/20  Alba Cory, MD    Physical Exam: Vitals:   05/06/20 1415 05/06/20 1430 05/06/20 1445 05/06/20 1500  BP: 121/74 114/78 (!) 126/91 (!) 133/91  Pulse: (!) 108 (!) 107 (!) 105 (!) 103  Resp: (!) 24 20 (!) 24 (!) 22  Temp:      TempSrc:      SpO2: 92% 91% 92% 92%     . General:  Appears with persistent mild respiratory distress, but generally comfortable and conversant without difficulty . Eyes:  PERRL, EOMI, normal lids, iris . ENT:  grossly normal hearing, lips & tongue, mmm . Neck:  no LAD, masses or thyromegaly . Cardiovascular:  RR with mild tachycardia, no m/r/g. No LE edema.  Marland Kitchen Respiratory:  Diffuse wheezes, moderate to poor air movement.  Mildly to moderately increased respiratory effort. . Abdomen:  soft, NT, ND . Back:    normal alignment, no CVAT . Skin:  no rash or induration seen on limited exam . Musculoskeletal:  grossly normal tone BUE/BLE, good ROM, no bony abnormality . Psychiatric:  grossly normal mood and affect, speech fluent and appropriate, AOx3 . Neurologic:  CN 2-12 grossly intact, moves all extremities in coordinated fashion    Radiological Exams on Admission: Independently reviewed - see discussion in A/P where applicable  DG Chest Port 1 View  Result Date: 05/06/2020 CLINICAL DATA:  Shortness of breath. EXAM: PORTABLE CHEST 1 VIEW COMPARISON:  Chest x-ray 04/11/2020, 11/20/2018. CT 04/13/2020 and 06/30/2019. FINDINGS: Bilateral postsurgical changes and noted. Mediastinum is normal. Heart size normal. Chronic bilateral perihilar changes of bronchiectasis and scarring. Bilateral pleural-parenchymal thickening consistent scarring again noted. No pleural effusion or pneumothorax. Stable elevation left hemidiaphragm. No acute bony abnormality identified. IMPRESSION: Bilateral postsurgical changes again noted. Chronic bilateral perihilar changes of bronchiectasis and scarring. Bilateral pleural-parenchymal thickening consistent with scarring again noted. Electronically Signed   By: Maisie Fus  Register   On: 05/06/2020 09:27    EKG: Independently reviewed.  Sinus tachycardia with rate 117; LVH, nonspecific ST changes likely with rate   Labs on Admission: I have personally reviewed the available labs and imaging studies at the time of the admission.  Pertinent labs:   Glucose 149 HS troponin 7 WBC 12.0   Assessment/Plan Principal Problem:   COPD with acute exacerbation (HCC) Active Problems:   Acute respiratory failure with hypoxemia (HCC)   Hypertension   Diabetes mellitus type 2 in nonobese (HCC)   Chronic pain   Tobacco dependence   Acute on chronic respiratory failure associated with a COPD exacerbation -Patient's shortness of breath and wheezing are most likely caused by acute COPD  exacerbation.  -He reports that his neb machine is not working well and he also continues to smoke -Seasonal allergies may also be a consideration and he is scheduled to see an allergist next month -He does not have fever or leukocytosis.  -Chest  x-ray is not consistent with pneumonia -He was given a continuous neb treatment in the ED with some improvement.   -will admit patient - with his failure of outpatient therapy and markedly decreased O2 sats (into the 60s), it seems likely that he will need several days of hospitalization to show sufficient improvement for discharge. -Nebulizers: scheduled Duoneb and prn albuterol -Solu-Medrol 80 mg IV BID -> Prednisone 40 mg PO daily -IV Azithromycin -He has been taking Symbicort by Med Rec, but will change to Anoro to see if this improves his baseline symptoms (particularly nocturnal symptoms) -Continue Zyrtec (Claritin formulary substitution) -Coordinated care with TOC team/PT/OT/Nutrition/RT consults  HTN -Continue Norvasc, ASA, HCTZ  DM -Recent A1c shows reasonable control -hold Glucophage, Glipizide  -Cover with moderate-scale SSI  Chronic pain -I have reviewed this patient in the [place] Controlled Substances Reporting System.  He is receiving medications from only one provider and appears to be taking them as prescribed. -He is at increased risk of opioid misuse, diversion, or overdose. -Continue Oxycodone, Neurontin, Skelaxin  Tobacco dependence -Encourage cessation.   -This was discussed with the patient and should be reviewed on an ongoing basis.   -Patch ordered at patient request.    Note: This patient has been tested and is pending for the novel coronavirus COVID-19.    DVT prophylaxis: Lovenox  Code Status:  Full - confirmed with patient/Family Family Communication: Significant other was present throughout evaluation Disposition Plan:  The patient is from: home  Anticipated d/c is to: [facility]    Anticipated d/c date will depend on clinical response to treatment, but possibly in the next 2-3 days if he has excellent response to treatment  Patient is currently: acutely ill Consults called: TOC team/PT/OT/Nutrition/RT  Admission status: Admit - It is my clinical opinion that admission to INPATIENT is reasonable and necessary because this patient will require at least 2 midnights in the hospital to treat this condition based on the medical complexity of the problems presented.  Given the aforementioned information, the predictability of an adverse outcome is felt to be significant.     Jonah BlueJennifer Stefana Lodico MD [facility]   How to contact the [facility] Attending or Consulting provider 7A - 7P or covering provider during after hours 7P -7A, for this patient?  1. Check the care team in [facility] and look for a) attending/consulting [facility] provider listed and b) the [facility] team listed 2. Log into [email or web address] and use [place]'s universal password to access. If you do not have the password, please contact the hospital operator. 3. Locate the [facility] provider you are looking for under [facility] and page to a number that you can be directly reached. 4. If you still have difficulty reaching the provider, please page the [facility] (Director on Call) for the Hospitalists listed on amion for assistance.   05/06/2020, 3:24 PM

## 2020-05-07 DIAGNOSIS — G8929 Other chronic pain: Secondary | ICD-10-CM

## 2020-05-07 DIAGNOSIS — F17200 Nicotine dependence, unspecified, uncomplicated: Secondary | ICD-10-CM

## 2020-05-07 DIAGNOSIS — J9601 Acute respiratory failure with hypoxia: Secondary | ICD-10-CM

## 2020-05-07 DIAGNOSIS — E119 Type 2 diabetes mellitus without complications: Secondary | ICD-10-CM

## 2020-05-07 DIAGNOSIS — E440 Moderate protein-calorie malnutrition: Secondary | ICD-10-CM | POA: Insufficient documentation

## 2020-05-07 LAB — CBC
HCT: 37.2 % — ABNORMAL LOW (ref 39.0–52.0)
Hemoglobin: 12.1 g/dL — ABNORMAL LOW (ref 13.0–17.0)
MCH: 28.9 pg (ref 26.0–34.0)
MCHC: 32.5 g/dL (ref 30.0–36.0)
MCV: 89 fL (ref 80.0–100.0)
Platelets: 294 10*3/uL (ref 150–400)
RBC: 4.18 MIL/uL — ABNORMAL LOW (ref 4.22–5.81)
RDW: 14.4 % (ref 11.5–15.5)
WBC: 8.1 10*3/uL (ref 4.0–10.5)
nRBC: 0 % (ref 0.0–0.2)

## 2020-05-07 LAB — BASIC METABOLIC PANEL WITH GFR
Anion gap: 6 (ref 5–15)
BUN: 15 mg/dL (ref 6–20)
CO2: 28 mmol/L (ref 22–32)
Calcium: 8.7 mg/dL — ABNORMAL LOW (ref 8.9–10.3)
Chloride: 102 mmol/L (ref 98–111)
Creatinine, Ser: 0.82 mg/dL (ref 0.61–1.24)
GFR, Estimated: >60 mL/min
Glucose, Bld: 197 mg/dL — ABNORMAL HIGH (ref 70–99)
Potassium: 4.1 mmol/L (ref 3.5–5.1)
Sodium: 136 mmol/L (ref 135–145)

## 2020-05-07 LAB — GLUCOSE, CAPILLARY
Glucose-Capillary: 245 mg/dL — ABNORMAL HIGH (ref 70–99)
Glucose-Capillary: 273 mg/dL — ABNORMAL HIGH (ref 70–99)
Glucose-Capillary: 80 mg/dL (ref 70–99)
Glucose-Capillary: 198 mg/dL — ABNORMAL HIGH (ref 70–99)

## 2020-05-07 MED ORDER — IPRATROPIUM-ALBUTEROL 0.5-2.5 (3) MG/3ML IN SOLN
3.0000 mL | RESPIRATORY_TRACT | Status: DC | PRN
  Administered 2020-05-08: 3 mL via RESPIRATORY_TRACT

## 2020-05-07 MED ORDER — LEVALBUTEROL HCL 0.63 MG/3ML IN NEBU
0.6300 mg | INHALATION_SOLUTION | Freq: Four times a day (QID) | RESPIRATORY_TRACT | Status: DC
  Administered 2020-05-07 – 2020-05-08 (×5): 0.63 mg via RESPIRATORY_TRACT
  Filled 2020-05-07 (×6): qty 3

## 2020-05-07 MED ORDER — ENSURE ENLIVE PO LIQD
237.0000 mL | Freq: Two times a day (BID) | ORAL | Status: DC
  Administered 2020-05-08 – 2020-05-09 (×2): 237 mL via ORAL

## 2020-05-07 MED ORDER — ADULT MULTIVITAMIN W/MINERALS CH
1.0000 | ORAL_TABLET | Freq: Every day | ORAL | Status: DC
  Administered 2020-05-08 – 2020-05-09 (×2): 1 via ORAL
  Filled 2020-05-07 (×2): qty 1

## 2020-05-07 NOTE — Progress Notes (Signed)
Initial Nutrition Assessment  DOCUMENTATION CODES:  Non-severe (moderate) malnutrition in context of chronic illness  INTERVENTION:   Consider checking iron and ferritin levels.  Add Ensure Enlive po BID, each supplement provides 350 kcal and 20 grams of protein.  Add Magic cup TID with meals, each supplement provides 290 kcal and 9 grams of protein.  Add MVI with minerals.  NUTRITION DIAGNOSIS:  Moderate Malnutrition related to chronic illness (COPD) as evidenced by mild fat depletion,severe fat depletion,moderate fat depletion,mild muscle depletion,moderate muscle depletion.  GOAL:  Patient will meet greater than or equal to 90% of their needs  MONITOR:  PO intake,Supplement acceptance,Labs,Weight trends  REASON FOR ASSESSMENT:  Consult Other (Comment) (Nutritional goals)  ASSESSMENT:  60 yo male with a PMH of HTN, T2DM, and COPD (s/p B lung wedge resection in 2011) who presents with acute COPD exacerbation. Recently admitted 3/5-7 for COPD exacerbation and new dx of T2DM.  Spoke with pt at bedside. Pt in good spirits. He reports eating well at home. Breakfast: cereal, 2 hard boiled eggs, and toast. Lunch: hotdog. Dinner: Arts development officer and potatoes. Pt denies any snacking and only drinking coffee with creamer and sugar in the morning and water throughout the day.  Pt denies any weight loss. Per Epic, pt's weight has been steady the past two years. He reports his usual body weight to be between 140-145 lbs.  On exam, pt with some depletion in fat and muscle stores. Of note, pt had spoon shaped nails on exam, indicative of low iron. Consider checking iron levels - secure chatted MD.  Given depletions, encouraged intake of protein-rich foods. Pt open to trying Ensure and Magic Cups while in the hospital. RD gave pt Ensure coupons for after discharge.  Relevant Medications: colace BID, SSI, Protonix, prednisone, oxycodone Labs: reviewed; CBG 91-273 HbA1c: 6.6%  (04/2020)  NUTRITION - FOCUSED PHYSICAL EXAM: Flowsheet Row Most Recent Value  Orbital Region Mild depletion  Upper Arm Region Severe depletion  Thoracic and Lumbar Region Mild depletion  Buccal Region Moderate depletion  Temple Region Moderate depletion  Clavicle Bone Region Mild depletion  Clavicle and Acromion Bone Region Moderate depletion  Scapular Bone Region Mild depletion  Dorsal Hand Mild depletion  Patellar Region Mild depletion  Anterior Thigh Region Mild depletion  Posterior Calf Region Mild depletion  Edema (RD Assessment) None  Hair Reviewed  Eyes Reviewed  [pale conjunctiva]  Mouth Reviewed  Skin Reviewed  Nails Reviewed     Diet Order:   Diet Order            Diet Carb Modified Fluid consistency: Thin; Room service appropriate? Yes  Diet effective now                EDUCATION NEEDS:  Education needs have been addressed  Skin:  Skin Assessment: Reviewed RN Assessment  Last BM:  Unknown  Height:  Ht Readings from Last 1 Encounters:  04/14/20 5\' 5"  (1.651 m)   Weight:  Wt Readings from Last 1 Encounters:  04/14/20 63.5 kg   Ideal Body Weight:  61.8 kg  BMI:  There is no height or weight on file to calculate BMI.  Estimated Nutritional Needs:  Kcal:  1800-2000 Protein:  90-105 grams Fluid:  >1.8 L  06/14/20, RD, LDN Registered Dietitian After Hours/Weekend Pager # in [phone]

## 2020-05-07 NOTE — Plan of Care (Signed)

## 2020-05-07 NOTE — Progress Notes (Signed)
PROGRESS NOTE  Glenn [name] [id number] DOB: 09/18/1960 DOA: 05/06/2020 PCP: Fleet Contras, MD  HPI/Recap of past 24 hours: HPI from Dr Thurmon Fair is a 60 y.o. male with medical history significant of HTN; DM; and COPD (s/p B lung wedge resection in 2011) presenting with respiratory distress.  He was last admitted from 3/5-7 for COPD exacerbation and new diagnosis of DM (A1c 6.6). He reports that since d/c he continues to have nocturnal SOB and wheezing periodically, using his neb machine maybe several times a week.  He has an upcoming appointment with an allergist in May.  He thinks his neb machine is not working well and that he needs a new one.  His pulmonologist has suggested that his nocturnal dyspnea is associated with decreased lung capacity. Patient became acutely SOB overnight, tried his neb machine in the house as well as his neb machine for his car and also MDIs without improvement and so finally called 911.  Denies other symptoms, felt well yesterday.  He continues to smoke up to 1/2 ppd. In the ED, 60% on RA and not on home O2.  Given Solumedrol and Duoneb.  Admitted for further management.    Today, patient denies any worsening shortness of breath, denies any chest pain, still wheezing bilaterally, with some cough.  Patient denies any nausea/vomiting, fever/chills.  Assessment/Plan: Principal Problem:   COPD with acute exacerbation (HCC) Active Problems:   Acute respiratory failure with hypoxemia (HCC)   Hypertension   Diabetes mellitus type 2 in nonobese (HCC)   Chronic pain   Tobacco dependence   Acute on chronic respiratory failure likely 2/2 COPD exacerbation Patient currently requiring about 2 L of oxygen, saturating well Currently afebrile, with no leukocytosis, noted tachycardia Chest x-ray showed chronic bilateral perihilar changes of bronchiectasis and scarring Continue azithromycin Continue steroids, duo nebs, inhalers, Claritin, cough  suppressant Continue supplemental oxygen as needed  Hypertension Continue home Norvasc, hydrochlorothiazide  Diabetes mellitus type 2 Last A1c 6.6 on 04/12/2020 Continue SSI, Accu-Cheks, hypoglycemic protocol  Chronic pain Continue oxycodone, Neurontin, Skelaxin  Tobacco abuse Advised to quit Nicotine patch ordered     Estimated body mass index is 23.3 kg/m as calculated from the following:   Height as of 04/14/20: 5\' 5"  (1.651 m).   Weight as of 04/14/20: 63.5 kg.     Code Status: Full  Family Communication: Discussed extensively with patient  Disposition Plan: Status is: Inpatient  Remains inpatient appropriate because:Inpatient level of care appropriate due to severity of illness   Dispo: The patient is from: Home              Anticipated d/c is to: Home              Patient currently is not medically stable to d/c.   Difficult to place patient No    Consultants:  None  Procedures:  None  Antimicrobials:  Azithromycin  DVT prophylaxis: Lovenox   Objective: Vitals:   05/07/20 0758 05/07/20 0800 05/07/20 0919 05/07/20 0922  BP:   (!) 147/96 (!) 142/91  Pulse: (!) 116     Resp: (!) 22  20 20   Temp:   97.9 F (36.6 C) 98 F (36.7 C)  TempSrc:   Oral   SpO2: 97% 99% 98% 98%    Intake/Output Summary (Last 24 hours) at 05/07/2020 1108 Last data filed at 05/07/2020 0300 Gross per 24 hour  Intake 300 ml  Output 700 ml  Net -400 ml   There were  no vitals filed for this visit.  Exam:  General: NAD   Cardiovascular: S1, S2 present  Respiratory:  Bilateral wheezing noted  Abdomen: Soft, nontender, nondistended, bowel sounds present  Musculoskeletal: No bilateral pedal edema noted  Skin: Normal  Psychiatry: Normal mood   Data Reviewed: CBC: Recent Labs  Lab 05/06/20 0900 05/07/20 0200  WBC 12.0* 8.1  NEUTROABS 6.6  --   HGB 14.5 12.1*  HCT 46.1 37.2*  MCV 90.0 89.0  PLT 324 294   Basic Metabolic Panel: Recent Labs  Lab  05/06/20 0900 05/07/20 0200  NA 138 136  K 3.8 4.1  CL 101 102  CO2 29 28  GLUCOSE 149* 197*  BUN 13 15  CREATININE 0.90 0.82  CALCIUM 9.2 8.7*   GFR: CrCl cannot be calculated (Unknown ideal weight.). Liver Function Tests: No results for input(s): AST, ALT, ALKPHOS, BILITOT, PROT, ALBUMIN in the last 168 hours. No results for input(s): LIPASE, AMYLASE in the last 168 hours. No results for input(s): AMMONIA in the last 168 hours. Coagulation Profile: No results for input(s): INR, PROTIME in the last 168 hours. Cardiac Enzymes: No results for input(s): CKTOTAL, CKMB, CKMBINDEX, TROPONINI in the last 168 hours. BNP (last 3 results) No results for input(s): PROBNP in the last 8760 hours. HbA1C: No results for input(s): HGBA1C in the last 72 hours. CBG: Recent Labs  Lab 05/06/20 1700 05/06/20 2001 05/07/20 0707  GLUCAP 214* 225* 245*   Lipid Profile: No results for input(s): CHOL, HDL, LDLCALC, TRIG, CHOLHDL, LDLDIRECT in the last 72 hours. Thyroid Function Tests: No results for input(s): TSH, T4TOTAL, FREET4, T3FREE, THYROIDAB in the last 72 hours. Anemia Panel: No results for input(s): VITAMINB12, FOLATE, FERRITIN, TIBC, IRON, RETICCTPCT in the last 72 hours. Urine analysis: No results found for: COLORURINE, APPEARANCEUR, LABSPEC, PHURINE, GLUCOSEU, HGBUR, BILIRUBINUR, KETONESUR, PROTEINUR, UROBILINOGEN, NITRITE, LEUKOCYTESUR Sepsis Labs: @LABRCNTIP (procalcitonin:4,lacticidven:4)  )No results found for this or any previous visit (from the past 240 hour(s)).    Studies: No results found.  Scheduled Meds: . amLODipine  10 mg Oral Daily  . aspirin EC  81 mg Oral Daily  . azithromycin  500 mg Oral Daily  . docusate sodium  100 mg Oral BID  . enoxaparin (LOVENOX) injection  40 mg Subcutaneous Q24H  . escitalopram  10 mg Oral Daily  . gabapentin  300 mg Oral TID  . hydrochlorothiazide  12.5 mg Oral Daily  . insulin aspart  0-15 Units Subcutaneous TID WC  .  ipratropium-albuterol  3 mL Nebulization Q6H  . loratadine  10 mg Oral Daily  . lubiprostone  24 mcg Oral QHS  . nicotine  14 mg Transdermal Daily  . pantoprazole  40 mg Oral Daily  . predniSONE  40 mg Oral Q breakfast  . sodium chloride flush  3 mL Intravenous Q12H  . tamsulosin  0.4 mg Oral QPM  . umeclidinium-vilanterol  1 puff Inhalation Daily    Continuous Infusions:   LOS: 1 day     , MD [facility]  If 7PM-7AM, please contact night-coverage [email or web address] 05/07/2020, 11:08 AM

## 2020-05-07 NOTE — Plan of Care (Signed)
  Problem: Education: Goal: Knowledge of General Education information will improve Description: Including pain rating scale, medication(s)/side effects and non-pharmacologic comfort measures Outcome: Progressing   Problem: Clinical Measurements: Goal: Respiratory complications will improve Outcome: Progressing   Problem: Clinical Measurements: Goal: Cardiovascular complication will be avoided Outcome: Progressing   Problem: Coping: Goal: Level of anxiety will decrease Outcome: Progressing   

## 2020-05-08 ENCOUNTER — Other Ambulatory Visit: Payer: Self-pay

## 2020-05-08 LAB — BASIC METABOLIC PANEL
Anion gap: 6 (ref 5–15)
BUN: 17 mg/dL (ref 6–20)
CO2: 29 mmol/L (ref 22–32)
Calcium: 8.8 mg/dL — ABNORMAL LOW (ref 8.9–10.3)
Chloride: 102 mmol/L (ref 98–111)
Creatinine, Ser: 0.83 mg/dL (ref 0.61–1.24)
GFR, Estimated: >60 mL/min (ref >60)
Glucose, Bld: 181 mg/dL — ABNORMAL HIGH (ref 70–99)
Potassium: 3.8 mmol/L (ref 3.5–5.1)
Sodium: 137 mmol/L (ref 135–145)

## 2020-05-08 LAB — CBC WITH DIFFERENTIAL/PLATELET
Abs Immature Granulocytes: 0.13 10*3/uL — ABNORMAL HIGH (ref 0.00–0.07)
Basophils Absolute: 0 10*3/uL (ref 0.0–0.1)
Basophils Relative: 0 %
Eosinophils Absolute: 0 10*3/uL (ref 0.0–0.5)
Eosinophils Relative: 0 %
HCT: 36.4 % — ABNORMAL LOW (ref 39.0–52.0)
Hemoglobin: 11.5 g/dL — ABNORMAL LOW (ref 13.0–17.0)
Immature Granulocytes: 1 %
Lymphocytes Relative: 15 %
Lymphs Abs: 2.6 10*3/uL (ref 0.7–4.0)
MCH: 28.2 pg (ref 26.0–34.0)
MCHC: 31.6 g/dL (ref 30.0–36.0)
MCV: 89.2 fL (ref 80.0–100.0)
Monocytes Absolute: 1.7 10*3/uL — ABNORMAL HIGH (ref 0.1–1.0)
Monocytes Relative: 10 %
Neutro Abs: 12.9 10*3/uL — ABNORMAL HIGH (ref 1.7–7.7)
Neutrophils Relative %: 74 %
Platelets: 288 10*3/uL (ref 150–400)
RBC: 4.08 MIL/uL — ABNORMAL LOW (ref 4.22–5.81)
RDW: 14.6 % (ref 11.5–15.5)
WBC: 17.4 10*3/uL — ABNORMAL HIGH (ref 4.0–10.5)
nRBC: 0 % (ref 0.0–0.2)

## 2020-05-08 LAB — IRON AND TIBC
Iron: 34 ug/dL — ABNORMAL LOW (ref 45–182)
Saturation Ratios: 10 % — ABNORMAL LOW (ref 17.9–39.5)
TIBC: 329 ug/dL (ref 250–450)
UIBC: 295 ug/dL

## 2020-05-08 LAB — GLUCOSE, CAPILLARY
Glucose-Capillary: 119 mg/dL — ABNORMAL HIGH (ref 70–99)
Glucose-Capillary: 78 mg/dL (ref 70–99)
Glucose-Capillary: 197 mg/dL — ABNORMAL HIGH (ref 70–99)
Glucose-Capillary: 220 mg/dL — ABNORMAL HIGH (ref 70–99)

## 2020-05-08 LAB — FERRITIN: Ferritin: 15 ng/mL — ABNORMAL LOW (ref 24–336)

## 2020-05-08 LAB — VITAMIN B12: Vitamin B-12: 372 pg/mL (ref 180–914)

## 2020-05-08 LAB — FOLATE: Folate: 11.7 ng/mL (ref >5.9)

## 2020-05-08 MED ORDER — VITAMIN B-12 1000 MCG PO TABS
1000.0000 ug | ORAL_TABLET | Freq: Every day | ORAL | Status: DC
  Administered 2020-05-08 – 2020-05-09 (×2): 1000 ug via ORAL
  Filled 2020-05-08 (×2): qty 1

## 2020-05-08 MED ORDER — FERROUS SULFATE 325 (65 FE) MG PO TABS
325.0000 mg | ORAL_TABLET | Freq: Every day | ORAL | Status: DC
  Administered 2020-05-08 – 2020-05-09 (×2): 325 mg via ORAL
  Filled 2020-05-08 (×2): qty 1

## 2020-05-08 MED ORDER — SODIUM CHLORIDE 0.9 % IV SOLN
510.0000 mg | Freq: Once | INTRAVENOUS | Status: AC
  Administered 2020-05-08: 510 mg via INTRAVENOUS
  Filled 2020-05-08: qty 17

## 2020-05-08 NOTE — Progress Notes (Signed)
PROGRESS NOTE  Glenn [name] NIO:[id number] DOB: 08-29-1960 DOA: 05/06/2020 PCP: Fleet Contras, MD  HPI/Recap of past 24 hours: HPI from Dr Thurmon Fair is a 60 y.o. male with medical history significant of HTN; DM; and COPD (s/p B lung wedge resection in 2011) presenting with respiratory distress.  He was last admitted from 3/5-7 for COPD exacerbation and new diagnosis of DM (A1c 6.6). He reports that since d/c he continues to have nocturnal SOB and wheezing periodically, using his neb machine maybe several times a week.  He has an upcoming appointment with an allergist in May.  He thinks his neb machine is not working well and that he needs a new one.  His pulmonologist has suggested that his nocturnal dyspnea is associated with decreased lung capacity. Patient became acutely SOB overnight, tried his neb machine in the house as well as his neb machine for his car and also MDIs without improvement and so finally called 911.  Denies other symptoms, felt well yesterday.  He continues to smoke up to 1/2 ppd. In the ED, 60% on RA and not on home O2.  Given Solumedrol and Duoneb.  Admitted for further management.    Today, patient denies any new complaints, still somewhat short of breath as well as tachycardic at rest.  Denies any chest pain, palpitations, nausea/vomiting, fever/chills.     Assessment/Plan: Principal Problem:   COPD with acute exacerbation (HCC) Active Problems:   Acute respiratory failure with hypoxemia (HCC)   Hypertension   Diabetes mellitus type 2 in nonobese (HCC)   Chronic pain   Tobacco dependence   Malnutrition of moderate degree   Acute on chronic respiratory failure likely 2/2 COPD exacerbation Patient currently requiring about 2 L of oxygen, saturating well Currently afebrile, with leukocytosis (on steroids), noted tachycardia Chest x-ray showed chronic bilateral perihilar changes of bronchiectasis and scarring Continue azithromycin Continue steroids, duo  nebs, inhalers, Claritin, cough suppressant Continue supplemental oxygen as needed  Hypertension Continue home Norvasc, hydrochlorothiazide  Diabetes mellitus type 2 Last A1c 6.6 on 04/12/2020 Continue SSI, Accu-Cheks, hypoglycemic protocol  Iron deficiency anemia Vitamin B12 deficiency Iron 34, saturation 10, ferritin 15, Vitamin B12 372 Received 1 dose of Feraheme on 05/08/2020 Continue p.o. iron, po vitamin B12 supplementation Patient stated he has had his screening colonoscopy in [place] PCP to follow-up age-related screening  Chronic pain Continue oxycodone, Neurontin, Skelaxin  Tobacco abuse Advised to quit Nicotine patch ordered     Estimated body mass index is 23.3 kg/m as calculated from the following:   Height as of 04/14/20: 5\' 5"  (1.651 m).   Weight as of 04/14/20: 63.5 kg.     Code Status: Full  Family Communication: Discussed extensively with patient, fiance at beside on 05/08/20  Disposition Plan: Status is: Inpatient  Remains inpatient appropriate because:Inpatient level of care appropriate due to severity of illness   Dispo: The patient is from: Home              Anticipated d/c is to: Home              Patient currently is not medically stable to d/c.   Difficult to place patient No    Consultants:  None  Procedures:  None  Antimicrobials:  Azithromycin  DVT prophylaxis: Lovenox   Objective: Vitals:   05/08/20 0131 05/08/20 0601 05/08/20 0842 05/08/20 1040  BP:  125/80  (!) 158/101  Pulse: 94 93 100   Resp: 20 19 20    Temp:  98.1 F (36.7 C)  98.7 F (37.1 C)  TempSrc:  Oral  Oral  SpO2: 98% 95% 99% 99%    Intake/Output Summary (Last 24 hours) at 05/08/2020 1652 Last data filed at 05/08/2020 1214 Gross per 24 hour  Intake 220 ml  Output --  Net 220 ml   There were no vitals filed for this visit.  Exam:  General: NAD   Cardiovascular: S1, S2 present  Respiratory:  Diminished BS b/l  Abdomen: Soft, nontender,  nondistended, bowel sounds present  Musculoskeletal: No bilateral pedal edema noted  Skin: Normal  Psychiatry: Normal mood   Data Reviewed: CBC: Recent Labs  Lab 05/06/20 0900 05/07/20 0200 05/08/20 0152  WBC 12.0* 8.1 17.4*  NEUTROABS 6.6  --  12.9*  HGB 14.5 12.1* 11.5*  HCT 46.1 37.2* 36.4*  MCV 90.0 89.0 89.2  PLT 324 294 288   Basic Metabolic Panel: Recent Labs  Lab 05/06/20 0900 05/07/20 0200 05/08/20 0152  NA 138 136 137  K 3.8 4.1 3.8  CL 101 102 102  CO2 29 28 29   GLUCOSE 149* 197* 181*  BUN 13 15 17   CREATININE 0.90 0.82 0.83  CALCIUM 9.2 8.7* 8.8*   GFR: CrCl cannot be calculated (Unknown ideal weight.). Liver Function Tests: No results for input(s): AST, ALT, ALKPHOS, BILITOT, PROT, ALBUMIN in the last 168 hours. No results for input(s): LIPASE, AMYLASE in the last 168 hours. No results for input(s): AMMONIA in the last 168 hours. Coagulation Profile: No results for input(s): INR, PROTIME in the last 168 hours. Cardiac Enzymes: No results for input(s): CKTOTAL, CKMB, CKMBINDEX, TROPONINI in the last 168 hours. BNP (last 3 results) No results for input(s): PROBNP in the last 8760 hours. HbA1C: No results for input(s): HGBA1C in the last 72 hours. CBG: Recent Labs  Lab 05/07/20 1636 05/07/20 2031 05/08/20 0716 05/08/20 1124 05/08/20 1544  GLUCAP 80 198* 119* 78 197*   Lipid Profile: No results for input(s): CHOL, HDL, LDLCALC, TRIG, CHOLHDL, LDLDIRECT in the last 72 hours. Thyroid Function Tests: No results for input(s): TSH, T4TOTAL, FREET4, T3FREE, THYROIDAB in the last 72 hours. Anemia Panel: Recent Labs    05/08/20 0152  VITAMINB12 372  FOLATE 11.7  FERRITIN 15*  TIBC 329  IRON 34*   Urine analysis: No results found for: COLORURINE, APPEARANCEUR, LABSPEC, PHURINE, GLUCOSEU, HGBUR, BILIRUBINUR, KETONESUR, PROTEINUR, UROBILINOGEN, NITRITE, LEUKOCYTESUR Sepsis Labs: @LABRCNTIP (procalcitonin:4,lacticidven:4)  )No results found  for this or any previous visit (from the past 240 hour(s)).    Studies: No results found.  Scheduled Meds: . amLODipine  10 mg Oral Daily  . aspirin EC  81 mg Oral Daily  . azithromycin  500 mg Oral Daily  . docusate sodium  100 mg Oral BID  . enoxaparin (LOVENOX) injection  40 mg Subcutaneous Q24H  . escitalopram  10 mg Oral Daily  . feeding supplement  237 mL Oral BID BM  . [START ON 05/09/2020] ferrous sulfate  325 mg Oral Q breakfast  . gabapentin  300 mg Oral TID  . hydrochlorothiazide  12.5 mg Oral Daily  . insulin aspart  0-15 Units Subcutaneous TID WC  . levalbuterol  0.63 mg Nebulization Q6H  . loratadine  10 mg Oral Daily  . lubiprostone  24 mcg Oral QHS  . multivitamin with minerals  1 tablet Oral Daily  . nicotine  14 mg Transdermal Daily  . pantoprazole  40 mg Oral Daily  . predniSONE  40 mg Oral Q breakfast  . sodium  chloride flush  3 mL Intravenous Q12H  . tamsulosin  0.4 mg Oral QPM  . umeclidinium-vilanterol  1 puff Inhalation Daily  . vitamin B-12  1,000 mcg Oral Daily    Continuous Infusions:   LOS: 2 days     Briant Cedar, MD [facility]  If 7PM-7AM, please contact night-coverage [email or web address] 05/08/2020, 4:52 PM

## 2020-05-09 DIAGNOSIS — I10 Essential (primary) hypertension: Secondary | ICD-10-CM

## 2020-05-09 DIAGNOSIS — E440 Moderate protein-calorie malnutrition: Secondary | ICD-10-CM

## 2020-05-09 LAB — CBC WITH DIFFERENTIAL/PLATELET
Abs Immature Granulocytes: 0.12 10*3/uL — ABNORMAL HIGH (ref 0.00–0.07)
Basophils Absolute: 0 10*3/uL (ref 0.0–0.1)
Basophils Relative: 0 %
Eosinophils Absolute: 0 10*3/uL (ref 0.0–0.5)
Eosinophils Relative: 0 %
HCT: 37.5 % — ABNORMAL LOW (ref 39.0–52.0)
Hemoglobin: 12.2 g/dL — ABNORMAL LOW (ref 13.0–17.0)
Immature Granulocytes: 1 %
Lymphocytes Relative: 20 %
Lymphs Abs: 2.1 10*3/uL (ref 0.7–4.0)
MCH: 28.8 pg (ref 26.0–34.0)
MCHC: 32.5 g/dL (ref 30.0–36.0)
MCV: 88.7 fL (ref 80.0–100.0)
Monocytes Absolute: 1.2 10*3/uL — ABNORMAL HIGH (ref 0.1–1.0)
Monocytes Relative: 11 %
Neutro Abs: 7.3 10*3/uL (ref 1.7–7.7)
Neutrophils Relative %: 68 %
Platelets: 288 10*3/uL (ref 150–400)
RBC: 4.23 MIL/uL (ref 4.22–5.81)
RDW: 14.5 % (ref 11.5–15.5)
WBC: 10.8 10*3/uL — ABNORMAL HIGH (ref 4.0–10.5)
nRBC: 0 % (ref 0.0–0.2)

## 2020-05-09 LAB — BASIC METABOLIC PANEL
Anion gap: 5 (ref 5–15)
BUN: 16 mg/dL (ref 6–20)
CO2: 32 mmol/L (ref 22–32)
Calcium: 9.4 mg/dL (ref 8.9–10.3)
Chloride: 101 mmol/L (ref 98–111)
Creatinine, Ser: 0.9 mg/dL (ref 0.61–1.24)
GFR, Estimated: >60 mL/min (ref >60)
Glucose, Bld: 123 mg/dL — ABNORMAL HIGH (ref 70–99)
Potassium: 4 mmol/L (ref 3.5–5.1)
Sodium: 138 mmol/L (ref 135–145)

## 2020-05-09 LAB — GLUCOSE, CAPILLARY
Glucose-Capillary: 182 mg/dL — ABNORMAL HIGH (ref 70–99)
Glucose-Capillary: 103 mg/dL — ABNORMAL HIGH (ref 70–99)

## 2020-05-09 MED ORDER — LEVALBUTEROL HCL 0.63 MG/3ML IN NEBU
0.6300 mg | INHALATION_SOLUTION | Freq: Three times a day (TID) | RESPIRATORY_TRACT | Status: DC
  Administered 2020-05-09: 0.63 mg via RESPIRATORY_TRACT
  Filled 2020-05-09: qty 3

## 2020-05-09 MED ORDER — AZITHROMYCIN 500 MG PO TABS: 500.0000 mg | ORAL_TABLET | Freq: Every day | ORAL | 0 refills | Status: AC

## 2020-05-09 MED ORDER — GUAIFENESIN ER 600 MG PO TB12: 600.0000 mg | ORAL_TABLET | Freq: Two times a day (BID) | ORAL | 0 refills | Status: AC | PRN

## 2020-05-09 MED ORDER — CYANOCOBALAMIN 1000 MCG PO TABS: 1000.0000 ug | ORAL_TABLET | Freq: Every day | ORAL | 0 refills | Status: DC

## 2020-05-09 MED ORDER — ADULT MULTIVITAMIN W/MINERALS CH: 1.0000 | ORAL_TABLET | Freq: Every day | ORAL | 0 refills | Status: DC

## 2020-05-09 MED ORDER — FERROUS SULFATE 325 (65 FE) MG PO TABS: 325.0000 mg | ORAL_TABLET | Freq: Every day | ORAL | 0 refills | Status: DC

## 2020-05-09 NOTE — Discharge Summary (Signed)
Discharge Summary  Glenn [name]:[id number] DOB: 1960/06/25  PCP: Fleet Contras, MD  Admit date: 05/06/2020 Discharge date: 05/09/2020  Time spent: 40 mins  Recommendations for Outpatient Follow-up:  1. PCP follow-up in 1 week    Discharge Diagnoses:  Active Hospital Problems   Diagnosis Date Noted  . COPD with acute exacerbation (HCC) 06/30/2019  . Malnutrition of moderate degree 05/07/2020  . Chronic pain 05/06/2020  . Tobacco dependence 05/06/2020  . Diabetes mellitus type 2 in nonobese (HCC)   . Hypertension   . Acute respiratory failure with hypoxemia (HCC) 06/30/2019    Resolved Hospital Problems   Diagnosis Date Noted Date Resolved  . Hyperglycemia 04/11/2020 05/06/2020    Discharge Condition: Stable  Diet recommendation: Heart healthy/moderate carb  Vitals:   05/09/20 0826 05/09/20 0836  BP: 136/89   Pulse: 98 92  Resp: 15 16  Temp: 97.8 F (36.6 C)   SpO2: 97% 99%    History of present illness:  Margaretmary Dys a 60 y.o.malewith medical history significant ofHTN; DM; and COPD (s/p B lung wedge resection in 2011) presenting with respiratory distress. He was last admitted from 3/5-7 for COPD exacerbation and new diagnosis of DM (A1c 6.6). He reports that since d/c he continues to have nocturnal SOB and wheezing periodically, using his neb machine maybe several times a week. He has an upcoming appointment with an allergist in May. He thinks his neb machine is not working well and that he needs a new one. His pulmonologist has suggested that his nocturnal dyspnea is associated with decreased lung capacity. Patient became acutely SOB overnight, tried his neb machine in the house as well as his neb machine for his car and also MDIs without improvement and so finally called 911. Denies other symptoms, felt well yesterday. He continues to smoke up to 1/2 ppd. In the ED, 61% on RA and not on home O2. Given Solumedrol and Duoneb.  Admitted for further  management.    Today, patient denies any new complaints, reports feeling much better overall, was able to ambulate the hallway without any issues.  Denies any chest pain, palpitations, worsening shortness of breath.  Patient advised to follow-up with PCP for further management.  Patient was provided with a new nebulizer machine    Hospital Course:  Principal Problem:   COPD with acute exacerbation (HCC) Active Problems:   Acute respiratory failure with hypoxemia (HCC)   Hypertension   Diabetes mellitus type 2 in nonobese (HCC)   Chronic pain   Tobacco dependence   Malnutrition of moderate degree   Acute on chronic respiratory failure likely 2/2 COPD exacerbation Patient saturating well on room air upon discharge Currently afebrile, with leukocytosis (on steroids) Chest x-ray showed chronic bilateral perihilar changes of bronchiectasis and scarring Discharged on azithromycin to complete 5 days Discharged on tapered dose of steroids, duo nebs, inhalers, Claritin, cough suppressant PCP to consider switching patient's inhaler to Anoro to see if that will help pending affordability  Hypertension Continue home Norvasc, hydrochlorothiazide  Diabetes mellitus type 2 Last A1c 6.6 on 04/12/2020 Continue home insulin regimen  Iron deficiency anemia Vitamin B12 deficiency Iron 34, saturation 10, ferritin 15, Vitamin B12 372 Received 1 dose of Feraheme on 05/08/2020 Continue p.o. iron, po vitamin B12 supplementation Patient stated he has had his screening colonoscopy in [place] PCP to follow-up age-related screening and further evaluation of iron def anemia  Chronic pain Continue oxycodone, Neurontin, Skelaxin  Tobacco abuse Advised to quit Nicotine patch  Malnutrition Type:  Nutrition Problem: Moderate Malnutrition Etiology: chronic illness (COPD)   Malnutrition Characteristics:  Signs/Symptoms: mild fat depletion,severe fat depletion,moderate fat depletion,mild  muscle depletion,moderate muscle depletion   Nutrition Interventions:  Interventions: Ensure Enlive (each supplement provides 350kcal and 20 grams of protein),MVI,Magic cup   Estimated body mass index is 23.3 kg/m as calculated from the following:   Height as of this encounter:  (1.651 m).   Weight as of 04/14/20: 63.5 kg.    Procedures:  None  Consultations:  None    Discharge Exam: BP 136/89 (BP Location: Right Arm)   Pulse 92   Temp 97.8 F (36.6 C)   Resp 16   Ht  (1.651 m) Comment: stated  SpO2 99%   BMI 23.30 kg/m    General: NAD Cardiovascular: S1, S2 present Respiratory: Diminished breath sounds bilaterally     Discharge Instructions You were cared for by a hospitalist during your hospital stay. If you have any questions about your discharge medications or the care you received while you were in the hospital after you are discharged, you can call the unit and asked to speak with the hospitalist on call if the hospitalist that took care of you is not available. Once you are discharged, your primary care physician will handle any further medical issues. Please note that NO REFILLS for any discharge medications will be authorized once you are discharged, as it is imperative that you return to your primary care physician (or establish a relationship with a primary care physician if you do not have one) for your aftercare needs so that they can reassess your need for medications and monitor your lab values.  Discharge Instructions    Diet - low sodium heart healthy   Complete by: As directed    Increase activity slowly   Complete by: As directed      Allergies as of 05/09/2020      Reactions   Insulins Swelling   Ibuprofen    Abdominal sensitivity   Shrimp [shellfish Allergy] Itching   Tylenol [acetaminophen]    Abdominal sensitvity   Lisinopril Rash   Tongue swelling      Medication List    STOP taking these medications   predniSONE 20 MG  tablet Commonly known as: DELTASONE Replaced by: predniSONE 10 MG (21) Tbpk tablet     TAKE these medications   albuterol (2.5 MG/3ML) 0.083% nebulizer solution Commonly known as: PROVENTIL PLEASE SEE ATTACHED FOR DETAILED DIRECTIONS What changed: See the new instructions.   albuterol 108 (90 Base) MCG/ACT inhaler Commonly known as: Proventil HFA Inhale 2 puffs into the lungs every 4 (four) hours as needed for wheezing or shortness of breath. What changed: Another medication with the same name was changed. Make sure you understand how and when to take each.   Amitiza 24 MCG capsule Generic drug: lubiprostone Take 24 mcg by mouth at bedtime.   amLODipine 10 MG tablet Commonly known as: NORVASC Take 10 mg by mouth daily.   Aspirin Low Dose 81 MG EC tablet Generic drug: aspirin Take 81 mg by mouth daily.   azithromycin 500 MG tablet Commonly known as: Zithromax Take 1 tablet (500 mg total) by mouth daily for 1 dose. Take 1 tablet daily for 3 days.   budesonide-formoterol 160-4.5 MCG/ACT inhaler Commonly known as: Symbicort Inhale 2 puffs into the lungs 2 (two) times daily.   cetirizine 10 MG tablet Commonly known as: ZyrTEC Allergy Take 1 tablet (10 mg total) by  mouth 2 (two) times daily as needed for allergies (swelling).   cyanocobalamin 1000 MCG tablet Take 1 tablet (1,000 mcg total) by mouth daily. Start taking on: May 10, 2020   escitalopram 10 MG tablet Commonly known as: LEXAPRO Take 10 mg by mouth daily.   ferrous sulfate 325 (65 FE) MG tablet Take 1 tablet (325 mg total) by mouth daily with breakfast. Start taking on: May 10, 2020   gabapentin 300 MG capsule Commonly known as: Neurontin Take 1 capsule (300 mg total) by mouth 3 (three) times daily.   glipiZIDE 5 MG tablet Commonly known as: GLUCOTROL Take 0.5 tablets (2.5 mg total) by mouth 2 (two) times daily for 20 days.   guaiFENesin 600 MG 12 hr tablet Commonly known as: MUCINEX Take 1 tablet  (600 mg total) by mouth 2 (two) times daily as needed for up to 10 days for cough or to loosen phlegm.   hydrochlorothiazide 12.5 MG tablet Commonly known as: HYDRODIURIL Take 12.5 mg by mouth daily.   hydrOXYzine 25 MG tablet Commonly known as: ATARAX/VISTARIL Take 25 mg by mouth 2 (two) times daily as needed for anxiety.   metaxalone 800 MG tablet Commonly known as: SKELAXIN Take 1 tablet (800 mg total) by mouth 3 (three) times daily. What changed:   when to take this  reasons to take this   metFORMIN 500 MG tablet Commonly known as: GLUCOPHAGE Take 500 mg by mouth every morning.   multivitamin with minerals Tabs tablet Take 1 tablet by mouth daily. Start taking on: May 10, 2020   nicotine 14 mg/24hr patch Commonly known as: Nicoderm CQ Place 1 patch (14 mg total) onto the skin daily.   nitroGLYCERIN 0.4 MG SL tablet Commonly known as: NITROSTAT Place 0.4 mg under the tongue every 5 (five) minutes as needed for chest pain.   oxyCODONE 15 MG immediate release tablet Commonly known as: ROXICODONE Take 15 mg by mouth every 6 (six) hours as needed for pain.   pantoprazole 40 MG tablet Commonly known as: PROTONIX Take 1 tablet (40 mg total) by mouth daily. What changed:   when to take this  reasons to take this   predniSONE 10 MG (21) Tbpk tablet Commonly known as: STERAPRED UNI-PAK 21 TAB Take by mouth daily. Take 6 tabs by mouth daily  for 2 days, then 5 tabs for 2 days, then 4 tabs for 2 days, then 3 tabs for 2 days, 2 tabs for 2 days, then 1 tab by mouth daily for 2 days Replaces: predniSONE 20 MG tablet   sildenafil 100 MG tablet Commonly known as: VIAGRA Take 100 mg by mouth daily as needed for erectile dysfunction. What changed: Another medication with the same name was removed. Continue taking this medication, and follow the directions you see here.   Spiriva Respimat 2.5 MCG/ACT Aers Generic drug: Tiotropium Bromide Monohydrate 2 puffs each am    tamsulosin 0.4 MG Caps capsule Commonly known as: FLOMAX Take 0.4 mg by mouth every evening.   Vitamin D (Ergocalciferol) 1.25 MG (50000 UNIT) Caps capsule Commonly known as: DRISDOL Take 50,000 Units by mouth once a week. Mondays      Allergies  Allergen Reactions  . Insulins Swelling  . Ibuprofen     Abdominal sensitivity  . Shrimp [Shellfish Allergy] Itching  . Tylenol [Acetaminophen]     Abdominal sensitvity  . Lisinopril Rash    Tongue swelling    Follow-up Information    Fleet ContrasAvbuere, Edwin, MD. Schedule an appointment  as soon as possible for a visit in 1 week(s).   Specialty: Internal Medicine Contact information: [address] [place] [address] [phone]                The results of significant diagnostics from this hospitalization (including imaging, microbiology, ancillary and laboratory) are listed below for reference.    Significant Diagnostic Studies: CT Head Wo Contrast  Result Date: 04/15/2020 CLINICAL DATA:  Head trauma. New word-finding difficulty and dizziness. EXAM: CT HEAD WITHOUT CONTRAST TECHNIQUE: Contiguous axial images were obtained from the base of the skull through the vertex without intravenous contrast. COMPARISON:  None. FINDINGS: Brain: No evidence of acute large vascular territory infarction, hemorrhage, hydrocephalus, extra-axial collection or mass lesion/mass effect. Moderate white matter hypodensities. Vascular: No hyperdense vessel identified. Skull: No acute fracture. Sinuses/Orbits: Mild paranasal sinus mucosal thickening without air-fluid levels. No acute orbital abnormality. Other: No mastoid effusions. IMPRESSION: 1. No evidence of acute intracranial abnormality. 2. Moderate white matter hypodensities, most likely related to chronic microvascular ischemic disease. Electronically Signed   By: Feliberto Harts MD   On: 04/15/2020 13:00   CT ANGIO CHEST PE W OR WO CONTRAST  Result Date: 04/13/2020 CLINICAL DATA:  Respiratory  failure.  Question pulmonary emboli. EXAM: CT ANGIOGRAPHY CHEST WITH CONTRAST TECHNIQUE: Multidetector CT imaging of the chest was performed using the standard protocol during bolus administration of intravenous contrast. Multiplanar CT image reconstructions and MIPs were obtained to evaluate the vascular anatomy. CONTRAST:  80mL OMNIPAQUE IOHEXOL 350 MG/ML SOLN COMPARISON:  Chest radiography 04/11/2020. CT angiography 06/10/2019. FINDINGS: Cardiovascular: Heart size upper limits of normal. No pericardial effusion. Some coronary artery calcification is visible. Mild aortic atherosclerotic calcification is visible. Pulmonary arterial opacification is good. There are no pulmonary emboli. Mediastinum/Nodes: No mass or lymphadenopathy. Lungs/Pleura: No pleural effusion. No pneumothorax or hemothorax. Underlying pattern of pulmonary emphysema and scarring. Areas of previous pulmonary resection. Areas of bronchiectasis in both lungs. No sign of active infiltrate, collapse or edema. Upper Abdomen: Normal Musculoskeletal: Normal Review of the MIP images confirms the above findings. IMPRESSION: 1. No pulmonary emboli. 2. Underlying pattern of pulmonary emphysema and scarring. Areas of previous pulmonary resection. Areas of bronchiectasis in both lungs. No active infiltrate, collapse or edema. 3. Coronary artery calcification. Mild aortic atherosclerosis. Aortic Atherosclerosis (ICD10-I70.0) and Emphysema (ICD10-J43.9). Electronically Signed   By: Paulina Fusi M.D.   On: 04/13/2020 10:31   DG Chest Port 1 View  Result Date: 05/06/2020 CLINICAL DATA:  Shortness of breath. EXAM: PORTABLE CHEST 1 VIEW COMPARISON:  Chest x-ray 04/11/2020, 11/20/2018. CT 04/13/2020 and 06/30/2019. FINDINGS: Bilateral postsurgical changes and noted. Mediastinum is normal. Heart size normal. Chronic bilateral perihilar changes of bronchiectasis and scarring. Bilateral pleural-parenchymal thickening consistent scarring again noted. No pleural  effusion or pneumothorax. Stable elevation left hemidiaphragm. No acute bony abnormality identified. IMPRESSION: Bilateral postsurgical changes again noted. Chronic bilateral perihilar changes of bronchiectasis and scarring. Bilateral pleural-parenchymal thickening consistent with scarring again noted. Electronically Signed   By: Maisie Fus  Register   On: 05/06/2020 09:27   DG Chest Port 1 View  Result Date: 04/11/2020 CLINICAL DATA:  Shortness of breath.  Respiratory arrest EXAM: PORTABLE CHEST 1 VIEW COMPARISON:  06/29/2019 FINDINGS: Patient rotated right. Numerous leads and wires project over the chest. Normal heart size. Moderate left hemidiaphragm elevation. No pleural effusion or pneumothorax. Lung volumes are low. This accentuates the pulmonary interstitium. Volume loss in the right lung base. Surgical changes in the left hilum and likely the left lung  base. No well-defined lobar consolidation. Volume loss adjacent to the elevated left hemidiaphragm. IMPRESSION: No acute cardiopulmonary disease. Moderate left hemidiaphragm elevation with adjacent volume loss. Low lung volumes throughout with resultant pulmonary interstitial prominence. Electronically Signed   By: Jeronimo Greaves M.D.   On: 04/11/2020 16:25   ECHOCARDIOGRAM COMPLETE  Result Date: 04/13/2020    ECHOCARDIOGRAM REPORT   Patient Name:   Shahrukh Guadron Date of Exam: 04/13/2020 Medical Rec #:  [id number]   Height:       65.0 in Accession #:    [id number]  Weight:       137.5 lb Date of Birth:  01-29-1961   BSA:          1.687 m Patient Age:    59 years    BP:           131/91 mmHg Patient Gender: M           HR:           97 bpm. Exam Location:  Inpatient Procedure: 2D Echo Indications:    dyspnea  History:        Patient has no prior history of Echocardiogram examinations.                 COPD.  Sonographer:    Delcie Roch Referring Phys: [phone] PREETHA JOSEPH IMPRESSIONS  1. Left ventricular ejection fraction, by estimation, is 55 to 60%. The left  ventricle has normal function. The left ventricle has no regional wall motion abnormalities. There is mild concentric left ventricular hypertrophy. Left ventricular diastolic parameters are consistent with Grade I diastolic dysfunction (impaired relaxation).  2. Right ventricular systolic function is normal. The right ventricular size is normal. There is normal pulmonary artery systolic pressure.  3. The mitral valve is degenerative. Trivial mitral valve regurgitation.  4. The aortic valve is tricuspid. Aortic valve regurgitation is not visualized. No aortic stenosis is present.  5. The inferior vena cava is dilated in size with >50% respiratory variability, suggesting right atrial pressure of 8 mmHg. Comparison(s): No prior Echocardiogram. FINDINGS  Left Ventricle: Left ventricular ejection fraction, by estimation, is 55 to 60%. The left ventricle has normal function. The left ventricle has no regional wall motion abnormalities. The left ventricular internal cavity size was normal in size. There is  mild concentric left ventricular hypertrophy. Left ventricular diastolic parameters are consistent with Grade I diastolic dysfunction (impaired relaxation). Right Ventricle: The right ventricular size is normal. No increase in right ventricular wall thickness. Right ventricular systolic function is normal. There is normal pulmonary artery systolic pressure. The tricuspid regurgitant velocity is 2.85 m/s, and  with an assumed right atrial pressure of 3 mmHg, the estimated right ventricular systolic pressure is 35.5 mmHg. Left Atrium: Left atrial size was normal in size. Right Atrium: Right atrial size was normal in size. Pericardium: There is no evidence of pericardial effusion. Mitral Valve: The mitral valve is degenerative in appearance. There is mild thickening of the mitral valve leaflet(s). There is mild calcification of the mitral valve leaflet(s). Mild mitral annular calcification. Trivial mitral valve  regurgitation. Tricuspid Valve: The tricuspid valve is normal in structure. Tricuspid valve regurgitation is trivial. Aortic Valve: The aortic valve is tricuspid. Aortic valve regurgitation is not visualized. No aortic stenosis is present. Pulmonic Valve: The pulmonic valve was normal in structure. Pulmonic valve regurgitation is not visualized. Aorta: The aortic root and ascending aorta are structurally normal, with no evidence of dilitation. Venous: The inferior vena cava  is dilated in size with greater than 50% respiratory variability, suggesting right atrial pressure of 8 mmHg. IAS/Shunts: No atrial level shunt detected by color flow Doppler.  LEFT VENTRICLE PLAX 2D LVIDd:         3.80 cm  Diastology LVIDs:         3.00 cm  LV e' medial:    8.81 cm/s LV PW:         1.10 cm  LV E/e' medial:  8.3 LV IVS:        0.90 cm  LV e' lateral:   8.92 cm/s LVOT diam:     2.20 cm  LV E/e' lateral: 8.2 LV SV:         78 LV SV Index:   46 LVOT Area:     3.80 cm  RIGHT VENTRICLE             IVC RV S prime:     13.20 cm/s  IVC diam: 2.00 cm TAPSE (M-mode): 1.8 cm LEFT ATRIUM             Index       RIGHT ATRIUM           Index LA diam:        3.50 cm 2.07 cm/m  RA Area:     13.60 cm LA Vol (A2C):   75.6 ml 44.82 ml/m RA Volume:   30.50 ml  18.08 ml/m LA Vol (A4C):   55.7 ml 33.02 ml/m LA Biplane Vol: 65.7 ml 38.95 ml/m  AORTIC VALVE LVOT Vmax:   111.00 cm/s LVOT Vmean:  73.000 cm/s LVOT VTI:    0.205 m  AORTA Ao Asc diam: 3.40 cm MITRAL VALVE               TRICUSPID VALVE MV Area (PHT): 5.13 cm    TR Peak grad:   32.5 mmHg MV Decel Time: 148 msec    TR Vmax:        285.00 cm/s MV E velocity: 72.80 cm/s MV A velocity: 86.50 cm/s  SHUNTS MV E/A ratio:  0.84        Systemic VTI:  0.20 m                            Systemic Diam: 2.20 cm Laurance Flatten MD Electronically signed by Laurance Flatten MD Signature Date/Time: 04/13/2020/1:51:52 PM    Final    VAS [place] LOWER EXTREMITY VENOUS (DVT)  Result Date: 04/13/2020  Lower  Venous DVT Study Indications: D-Dimer.  Risk Factors: None identified. Comparison Study: No previous Exams Performing Technologist: Clint Guy RVT  Examination Guidelines: A complete evaluation includes B-mode imaging, spectral Doppler, color Doppler, and power Doppler as needed of all accessible portions of each vessel. Bilateral testing is considered an integral part of a complete examination. Limited examinations for reoccurring indications may be performed as noted. The reflux portion of the exam is performed with the patient in reverse Trendelenburg.  +---------+---------------+---------+-----------+----------+--------------+ RIGHT    CompressibilityPhasicitySpontaneityPropertiesThrombus Aging +---------+---------------+---------+-----------+----------+--------------+ CFV      Full           Yes      Yes                                 +---------+---------------+---------+-----------+----------+--------------+ SFJ      Full                                                        +---------+---------------+---------+-----------+----------+--------------+  FV Prox  Full                                                        +---------+---------------+---------+-----------+----------+--------------+ FV Mid   Full                                                        +---------+---------------+---------+-----------+----------+--------------+ FV DistalFull                                                        +---------+---------------+---------+-----------+----------+--------------+ PFV      Full                                                        +---------+---------------+---------+-----------+----------+--------------+ POP      Full           Yes      Yes                                 +---------+---------------+---------+-----------+----------+--------------+ PTV      Full                                                         +---------+---------------+---------+-----------+----------+--------------+ PERO     Full                                                        +---------+---------------+---------+-----------+----------+--------------+   +---------+---------------+---------+-----------+----------+--------------+ LEFT     CompressibilityPhasicitySpontaneityPropertiesThrombus Aging +---------+---------------+---------+-----------+----------+--------------+ CFV      Full           Yes      Yes                                 +---------+---------------+---------+-----------+----------+--------------+ SFJ      Full                                                        +---------+---------------+---------+-----------+----------+--------------+ FV Prox  Full                                                        +---------+---------------+---------+-----------+----------+--------------+  FV Mid   Full                                                        +---------+---------------+---------+-----------+----------+--------------+ FV DistalFull                                                        +---------+---------------+---------+-----------+----------+--------------+ PFV      Full                                                        +---------+---------------+---------+-----------+----------+--------------+ POP      Full           Yes      Yes                                 +---------+---------------+---------+-----------+----------+--------------+ PTV      Full                                                        +---------+---------------+---------+-----------+----------+--------------+ PERO     Full                                                        +---------+---------------+---------+-----------+----------+--------------+     Summary: BILATERAL: - No evidence of deep vein thrombosis seen in the lower extremities, bilaterally. -No evidence of  popliteal cyst, bilaterally.   *See table(s) above for measurements and observations. Electronically signed by Fabienne Bruns MD on 04/13/2020 at 9:16:35 PM.    Final     Microbiology: No results found for this or any previous visit (from the past 240 hour(s)).   Labs: Basic Metabolic Panel: Recent Labs  Lab 05/06/20 0900 05/07/20 0200 05/08/20 0152 05/09/20 0053  NA 138 136 137 138  K 3.8 4.1 3.8 4.0  CL 101 102 102 101  CO2 29 28 29  32  GLUCOSE 149* 197* 181* 123*  BUN 13 15 17 16   CREATININE 0.90 0.82 0.83 0.90  CALCIUM 9.2 8.7* 8.8* 9.4   Liver Function Tests: No results for input(s): AST, ALT, ALKPHOS, BILITOT, PROT, ALBUMIN in the last 168 hours. No results for input(s): LIPASE, AMYLASE in the last 168 hours. No results for input(s): AMMONIA in the last 168 hours. CBC: Recent Labs  Lab 05/06/20 0900 05/07/20 0200 05/08/20 0152 05/09/20 0053  WBC 12.0* 8.1 17.4* 10.8*  NEUTROABS 6.6  --  12.9* 7.3  HGB 14.5 12.1* 11.5* 12.2*  HCT 46.1 37.2* 36.4* 37.5*  MCV 90.0 89.0 89.2 88.7  PLT 324 294 288 288   Cardiac Enzymes: No results for input(s): CKTOTAL,  CKMB, CKMBINDEX, TROPONINI in the last 168 hours. BNP: BNP (last 3 results) Recent Labs    06/29/19 2245  BNP 19.3    ProBNP (last 3 results) No results for input(s): PROBNP in the last 8760 hours.  CBG: Recent Labs  Lab 05/08/20 0716 05/08/20 1124 05/08/20 1544 05/08/20 2059 05/09/20 0811  GLUCAP 119* 78 197* 220* 182*       Signed:  Briant Cedar, MD [facility] 05/09/2020, 11:48 AM

## 2020-05-09 NOTE — Progress Notes (Signed)
Patient discharged in stable condition via car. AVS documentation given and explained. Patient had no questions or concerns. Nebulizer machine was delivered to patient in room. Patient denied having any questions or concerns regarding machine or treatments.

## 2020-05-09 NOTE — Care Management (Signed)
Nebulizer to be delivered to the room.

## 2020-05-09 NOTE — Plan of Care (Signed)

## 2020-05-11 ENCOUNTER — Other Ambulatory Visit (HOSPITAL_COMMUNITY): Payer: Self-pay

## 2020-05-11 ENCOUNTER — Other Ambulatory Visit (HOSPITAL_COMMUNITY): Payer: Self-pay | Admitting: Internal Medicine

## 2020-05-12 ENCOUNTER — Other Ambulatory Visit (HOSPITAL_COMMUNITY): Payer: Self-pay

## 2020-05-13 ENCOUNTER — Other Ambulatory Visit (HOSPITAL_COMMUNITY): Payer: Self-pay

## 2020-05-13 MED ORDER — SILDENAFIL CITRATE 100 MG PO TABS
100.0000 mg | ORAL_TABLET | Freq: Every day | ORAL | 5 refills | Status: DC | PRN
  Filled 2020-05-13: qty 30, 30d supply, fill #0
  Filled 2020-06-16: qty 30, 30d supply, fill #1
  Filled 2020-07-17: qty 30, 30d supply, fill #2
  Filled 2020-08-17: qty 30, 30d supply, fill #3
  Filled 2020-09-14: qty 30, 30d supply, fill #0
  Filled 2020-10-19: qty 30, 30d supply, fill #1

## 2020-05-23 ENCOUNTER — Inpatient Hospital Stay (HOSPITAL_COMMUNITY)
Admission: EM | Admit: 2020-05-23 | Discharge: 2020-05-25 | DRG: 190 | Disposition: A | Discharge status: Home / Self Care | Payer: Medicaid Other | Attending: Internal Medicine | Admitting: Internal Medicine

## 2020-05-23 ENCOUNTER — Emergency Department (HOSPITAL_COMMUNITY): Payer: Medicaid Other

## 2020-05-23 ENCOUNTER — Other Ambulatory Visit: Payer: Self-pay

## 2020-05-23 ENCOUNTER — Encounter (HOSPITAL_COMMUNITY): Payer: Self-pay

## 2020-05-23 DIAGNOSIS — G8929 Other chronic pain: Secondary | ICD-10-CM | POA: Diagnosis present

## 2020-05-23 DIAGNOSIS — Z7984 Long term (current) use of oral hypoglycemic drugs: Secondary | ICD-10-CM

## 2020-05-23 DIAGNOSIS — Z91013 Allergy to seafood: Secondary | ICD-10-CM

## 2020-05-23 DIAGNOSIS — Z6823 Body mass index (BMI) 23.0-23.9, adult: Secondary | ICD-10-CM

## 2020-05-23 DIAGNOSIS — D509 Iron deficiency anemia, unspecified: Secondary | ICD-10-CM | POA: Diagnosis present

## 2020-05-23 DIAGNOSIS — Z8249 Family history of ischemic heart disease and other diseases of the circulatory system: Secondary | ICD-10-CM

## 2020-05-23 DIAGNOSIS — Z20822 Contact with and (suspected) exposure to covid-19: Secondary | ICD-10-CM | POA: Diagnosis present

## 2020-05-23 DIAGNOSIS — Z7982 Long term (current) use of aspirin: Secondary | ICD-10-CM

## 2020-05-23 DIAGNOSIS — F17210 Nicotine dependence, cigarettes, uncomplicated: Secondary | ICD-10-CM | POA: Diagnosis present

## 2020-05-23 DIAGNOSIS — Z9119 Patient's noncompliance with other medical treatment and regimen: Secondary | ICD-10-CM

## 2020-05-23 DIAGNOSIS — Z7951 Long term (current) use of inhaled steroids: Secondary | ICD-10-CM

## 2020-05-23 DIAGNOSIS — R627 Adult failure to thrive: Secondary | ICD-10-CM | POA: Diagnosis present

## 2020-05-23 DIAGNOSIS — K219 Gastro-esophageal reflux disease without esophagitis: Secondary | ICD-10-CM | POA: Diagnosis present

## 2020-05-23 DIAGNOSIS — I152 Hypertension secondary to endocrine disorders: Secondary | ICD-10-CM | POA: Diagnosis present

## 2020-05-23 DIAGNOSIS — E119 Type 2 diabetes mellitus without complications: Secondary | ICD-10-CM

## 2020-05-23 DIAGNOSIS — E1142 Type 2 diabetes mellitus with diabetic polyneuropathy: Secondary | ICD-10-CM | POA: Diagnosis present

## 2020-05-23 DIAGNOSIS — J441 Chronic obstructive pulmonary disease with (acute) exacerbation: Principal | ICD-10-CM | POA: Diagnosis present

## 2020-05-23 DIAGNOSIS — Z888 Allergy status to other drugs, medicaments and biological substances status: Secondary | ICD-10-CM

## 2020-05-23 DIAGNOSIS — D519 Vitamin B12 deficiency anemia, unspecified: Secondary | ICD-10-CM | POA: Diagnosis present

## 2020-05-23 DIAGNOSIS — J449 Chronic obstructive pulmonary disease, unspecified: Secondary | ICD-10-CM | POA: Diagnosis present

## 2020-05-23 DIAGNOSIS — N400 Enlarged prostate without lower urinary tract symptoms: Secondary | ICD-10-CM | POA: Diagnosis present

## 2020-05-23 DIAGNOSIS — Z79899 Other long term (current) drug therapy: Secondary | ICD-10-CM

## 2020-05-23 DIAGNOSIS — Z833 Family history of diabetes mellitus: Secondary | ICD-10-CM

## 2020-05-23 DIAGNOSIS — I10 Essential (primary) hypertension: Secondary | ICD-10-CM | POA: Diagnosis present

## 2020-05-23 DIAGNOSIS — J9601 Acute respiratory failure with hypoxia: Secondary | ICD-10-CM | POA: Diagnosis present

## 2020-05-23 DIAGNOSIS — Z886 Allergy status to analgesic agent status: Secondary | ICD-10-CM

## 2020-05-23 LAB — CBC WITH DIFFERENTIAL/PLATELET
Abs Immature Granulocytes: 0.01 10*3/uL (ref 0.00–0.07)
Basophils Absolute: 0.1 10*3/uL (ref 0.0–0.1)
Basophils Relative: 1 %
Eosinophils Absolute: 0.4 10*3/uL (ref 0.0–0.5)
Eosinophils Relative: 6 %
HCT: 40.7 % (ref 39.0–52.0)
Hemoglobin: 13.1 g/dL (ref 13.0–17.0)
Immature Granulocytes: 0 %
Lymphocytes Relative: 26 %
Lymphs Abs: 1.6 10*3/uL (ref 0.7–4.0)
MCH: 28.7 pg (ref 26.0–34.0)
MCHC: 32.2 g/dL (ref 30.0–36.0)
MCV: 89.1 fL (ref 80.0–100.0)
Monocytes Absolute: 0.7 10*3/uL (ref 0.1–1.0)
Monocytes Relative: 11 %
Neutro Abs: 3.6 10*3/uL (ref 1.7–7.7)
Neutrophils Relative %: 56 %
Platelets: 228 10*3/uL (ref 150–400)
RBC: 4.57 MIL/uL (ref 4.22–5.81)
RDW: 14.6 % (ref 11.5–15.5)
WBC: 6.4 10*3/uL (ref 4.0–10.5)
nRBC: 0 % (ref 0.0–0.2)

## 2020-05-23 MED ORDER — METHYLPREDNISOLONE SODIUM SUCC 125 MG IJ SOLR
125.0000 mg | Freq: Once | INTRAMUSCULAR | Status: AC
  Administered 2020-05-23: 125 mg via INTRAVENOUS
  Filled 2020-05-23: qty 2

## 2020-05-23 MED ORDER — MAGNESIUM SULFATE 2 GM/50ML IV SOLN
2.0000 g | Freq: Once | INTRAVENOUS | Status: AC
  Administered 2020-05-23: 2 g via INTRAVENOUS
  Filled 2020-05-23: qty 50

## 2020-05-23 MED ORDER — ALBUTEROL (5 MG/ML) CONTINUOUS INHALATION SOLN
20.0000 mg/h | INHALATION_SOLUTION | RESPIRATORY_TRACT | Status: DC
  Administered 2020-05-23: 20 mg/h via RESPIRATORY_TRACT
  Filled 2020-05-23: qty 20

## 2020-05-23 MED ORDER — ALBUTEROL (5 MG/ML) CONTINUOUS INHALATION SOLN
INHALATION_SOLUTION | RESPIRATORY_TRACT | Status: AC
  Filled 2020-05-23: qty 20

## 2020-05-23 NOTE — ED Provider Notes (Signed)
[name] Provider Note   CSN: [id number] Arrival date & time: 05/23/20  2146     History Chief Complaint  Patient presents with  . Shortness of Breath    [name] is a 60 y.o. male with a history of COPD, diabetes mellitus type 2, asthma, arthritis, chronic pain, HTN who presents the emergency department who presents the emergency department with a chief complaint of shortness of breath.  The patient reports sudden onset, severe shortness of breath this afternoon accompanied by wheezing and an intermittently productive cough.  He reports associated chest tightness.  No fever, chills, nausea, vomiting, diarrhea, posttussive emesis, dizziness, lightheadedness, syncope, headache, rash, leg swelling.  He attempted to treat his symptoms by doing back-to-back albuterol nebulizer treatments for 2 hours prior to arrival in the ED.  His wife notes that this is the fourth visit to the ED in the last month with 2 admissions for the same symptoms.  They have a pending initial consult with allergy immunology, but the appointment is not scheduled until May.  Prior to onset of symptoms today, the patient was in his usual state of health.  The patient stopped smoking 3 weeks ago and has been using a nicotine patch successfully.  Prior to smoking cessation, he was smoking approximately 6 to 7 cigarettes daily for the last 34 years.  The history is provided by the patient and medical records. No language interpreter was used.       Past Medical History:  Diagnosis Date  . Arthritis   . Asthma   . Chronic pain 05/06/2020  . COPD (chronic obstructive pulmonary disease) (HCC)   . Diabetes mellitus type 2 in nonobese (HCC)   . ED (erectile dysfunction)   . Hypertension   . Tobacco dependence 05/06/2020    Patient Active Problem List   Diagnosis Date Noted  . Malnutrition of moderate degree 05/07/2020  . Chronic pain 05/06/2020  . Tobacco dependence  05/06/2020  . Hypertension   . Diabetes mellitus type 2 in nonobese (HCC)   . COPD (chronic obstructive pulmonary disease) (HCC) 04/11/2020  . Elevated troponin 04/11/2020  . Acute on chronic respiratory failure with hypoxia (HCC) 04/11/2020  . Elevated transaminase level   . COPD with acute exacerbation (HCC) 06/30/2019  . Acute respiratory failure with hypoxemia (HCC) 06/30/2019  . Hypotension 06/30/2019  . Near syncope 06/30/2019  . Pleural nodule 06/30/2019  . Colon cancer screening 12/18/2017  . History of colonic polyps 12/18/2017  . 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 COPD GOLD III/ actively smoking  10/05/2016    Past Surgical History:  Procedure Laterality Date  . LUNG SURGERY     removed tissue       Family History  Problem Relation Age of Onset  . Hypertension Mother   . Diabetes Maternal Grandmother   . Colon cancer Neg Hx   . Esophageal cancer Neg Hx   . Rectal cancer Neg Hx   . Inflammatory bowel disease Neg Hx   . Liver disease Neg Hx   . Pancreatic cancer Neg Hx     Social History   Tobacco Use  . Smoking status: Current Every Day Smoker    Packs/day: 0.50    Years: 34.00    Pack years: 17.00    Types: Cigarettes  . Smokeless tobacco: Never Used  Vaping Use  . Vaping Use: Never used  Substance Use Topics  . Alcohol use: Never  . Drug use: Never  Home Medications Prior to Admission medications   Medication Sig Start Date End Date Taking? Authorizing Provider  albuterol (PROVENTIL HFA) 108 (90 Base) MCG/ACT inhaler Inhale 2 puffs into the lungs every 4 (four) hours as needed for wheezing or shortness of breath. 11/20/18  Yes [name], MD  albuterol (PROVENTIL) (2.5 MG/3ML) 0.083% nebulizer solution PLEASE SEE ATTACHED FOR DETAILED DIRECTIONS Patient taking differently: Take 2.5 mg by nebulization every 6 (six) hours as needed for wheezing or shortness of breath. 02/26/18  Yes [name], MD  AMITIZA 24 MCG capsule Take 24 mcg by  mouth at bedtime. 03/31/20  Yes [provider]  amLODipine (NORVASC) 10 MG tablet Take 10 mg by mouth daily.   Yes [provider]  ASPIRIN LOW DOSE 81 MG EC tablet Take 81 mg by mouth daily. 04/29/19  Yes [provider]  benzonatate (TESSALON) 100 MG capsule Take 200 mg by mouth every 8 (eight) hours as needed for cough. 05/09/20  Yes [provider]  cetirizine (ZYRTEC ALLERGY) 10 MG tablet Take 1 tablet (10 mg total) by mouth 2 (two) times daily as needed for allergies (swelling). Patient taking differently: Take 10 mg by mouth at bedtime. 04/14/20  Yes Benjiman Core, MD  ferrous sulfate 325 (65 FE) MG tablet Take 1 tablet (325 mg total) by mouth daily with breakfast. 05/10/20 06/09/20 Yes Briant Cedar, MD  gabapentin (NEURONTIN) 300 MG capsule Take 1 capsule (300 mg total) by mouth 3 (three) times daily. 02/09/18  Yes Eustace Moore, MD  hydrochlorothiazide (HYDRODIURIL) 12.5 MG tablet Take 12.5 mg by mouth daily. 03/26/20  Yes [provider]  hydrOXYzine (ATARAX/VISTARIL) 25 MG tablet Take 25 mg by mouth 2 (two) times daily as needed for anxiety. 04/14/20  Yes [provider]  metaxalone (SKELAXIN) 800 MG tablet Take 1 tablet (800 mg total) by mouth 3 (three) times daily. Patient taking differently: Take 800 mg by mouth 3 (three) times daily as needed for muscle spasms. 01/30/20  Yes Rhys Martini, PA-C  metFORMIN (GLUCOPHAGE) 500 MG tablet Take 500 mg by mouth every morning. 04/17/20  Yes [provider]  Multiple Vitamin (MULTIVITAMIN WITH MINERALS) TABS tablet Take 1 tablet by mouth daily. 05/10/20  Yes Briant Cedar, MD  nicotine (NICODERM CQ) 14 mg/24hr patch Place 1 patch (14 mg total) onto the skin daily. 05/06/20  Yes Fondaw, Wylder S, PA  nitroGLYCERIN (NITROSTAT) 0.4 MG SL tablet Place 0.4 mg under the tongue every 5 (five) minutes as needed for chest pain.   Yes [provider]  oxyCODONE (ROXICODONE) 15  MG immediate release tablet Take 15 mg by mouth every 6 (six) hours as needed for pain. 03/31/20  Yes [provider]  pantoprazole (PROTONIX) 40 MG tablet Take 1 tablet (40 mg total) by mouth daily. 07/02/19  Yes Elgergawy, Leana Roe, MD  sildenafil (VIAGRA) 100 MG tablet Take 1 tablet (100 mg total) by mouth daily as needed. Patient taking differently: Take 100 mg by mouth daily as needed for erectile dysfunction. 05/01/20  Yes   tamsulosin (FLOMAX) 0.4 MG CAPS capsule Take 0.4 mg by mouth every evening. 06/26/19  Yes [provider]  vitamin B-12 1000 MCG tablet Take 1 tablet (1,000 mcg total) by mouth daily. 05/10/20  Yes Briant Cedar, MD  Vitamin D, Ergocalciferol, (DRISDOL) 1.25 MG (50000 UNIT) CAPS capsule Take 50,000 Units by mouth once a week. Mondays 05/02/19  Yes [provider]  budesonide-formoterol (SYMBICORT) 160-4.5 MCG/ACT  inhaler Inhale 2 puffs into the lungs 2 (two) times daily. Patient not taking: Reported on 05/23/2020 05/13/19   [name], MD  glipiZIDE (GLUCOTROL) 5 MG tablet Take 0.5 tablets (2.5 mg total) by mouth 2 (two) times daily for 20 days. Patient not taking: Reported on 05/23/2020 04/13/20 05/03/20  Hartley Barefoot A, MD  Tiotropium Bromide Monohydrate (SPIRIVA RESPIMAT) 2.5 MCG/ACT AERS 2 puffs each am Patient not taking: Reported on 05/23/2020 01/15/20   [name], MD    Allergies    Insulins, Ibuprofen, Shrimp [shellfish allergy], Tylenol [acetaminophen], and Lisinopril  Review of Systems   Review of Systems  Constitutional: Negative for appetite change, chills, fatigue and fever.  HENT: Negative for congestion and sore throat.   Eyes: Negative for visual disturbance.  Respiratory: Positive for cough, shortness of breath and wheezing.   Cardiovascular: Positive for chest pain. Negative for palpitations and leg swelling.  Gastrointestinal: Negative for abdominal pain, diarrhea, nausea and vomiting.  Genitourinary: Negative  for dysuria.  Musculoskeletal: Negative for arthralgias, back pain, myalgias and neck pain.  Skin: Negative for rash.  Allergic/Immunologic: Negative for immunocompromised state.  Neurological: Negative for dizziness, seizures, syncope, weakness, numbness and headaches.  Psychiatric/Behavioral: Negative for confusion.    Physical Exam Updated Vital Signs BP 132/88 (BP Location: Right Arm)   Pulse 98   Temp 97.9 F (36.6 C) (Oral)   Resp 18   Ht  (1.651 m)   Wt 64.1 kg   SpO2 98%   BMI 23.53 kg/m   Physical Exam Vitals and nursing note reviewed.  Constitutional:      General: He is not in acute distress.    Appearance: He is well-developed. He is not ill-appearing, toxic-appearing or diaphoretic.     Comments: Nasal cannula in place.  HENT:     Head: Normocephalic.     Nose: Nose normal.  Eyes:     Conjunctiva/sclera: Conjunctivae normal.  Cardiovascular:     Rate and Rhythm: Regular rhythm. Tachycardia present.     Pulses: Normal pulses.     Heart sounds: Normal heart sounds. No murmur heard. No friction rub. No gallop.   Pulmonary:     Effort: Pulmonary effort is normal.     Comments: Lung sounds are significantly diminished throughout.  Wheezes noted bilaterally.  No rhonchi or rales.  He is mildly tachypneic.  No accessory muscle use or retractions. Abdominal:     General: There is no distension.     Palpations: Abdomen is soft. There is no mass.     Tenderness: There is no abdominal tenderness. There is no right CVA tenderness, left CVA tenderness, guarding or rebound.     Hernia: No hernia is present.  Musculoskeletal:     Cervical back: Neck supple.     Left lower leg: No edema.  Skin:    General: Skin is warm and dry.  Neurological:     General: No focal deficit present.     Mental Status: He is alert and oriented to person, place, and time.  Psychiatric:        Behavior: Behavior normal.     ED Results / Procedures / Treatments   Labs (all labs  ordered are listed, but only abnormal results are displayed) Labs Reviewed  BASIC METABOLIC PANEL - Abnormal; Notable for the following components:      Result Value   Potassium 3.4 (*)    Glucose, Bld 118 (*)    Calcium 8.7 (*)  All other components within normal limits  CBC - Abnormal; Notable for the following components:   Hemoglobin 12.9 (*)    All other components within normal limits  BASIC METABOLIC PANEL - Abnormal; Notable for the following components:   Potassium 3.3 (*)    Glucose, Bld 203 (*)    Calcium 8.8 (*)    All other components within normal limits  GLUCOSE, CAPILLARY - Abnormal; Notable for the following components:   Glucose-Capillary 229 (*)    All other components within normal limits  RESP PANEL BY RT-PCR (FLU A&B, COVID) ARPGX2  CBC WITH DIFFERENTIAL/PLATELET  HIV ANTIBODY (ROUTINE TESTING W REFLEX)  PROCALCITONIN  TROPONIN I (HIGH SENSITIVITY)  TROPONIN I (HIGH SENSITIVITY)    EKG EKG Interpretation  Date/Time:  Saturday May 23 2020 23:04:50 EDT Ventricular Rate:  102 PR Interval:  135 QRS Duration: 88 QT Interval:  347 QTC Calculation: 452 R Axis:   50 Text Interpretation: Sinus tachycardia Abnormal R-wave progression, early transition Consider left ventricular hypertrophy When compared with ECG of 05/06/2020, No significant change was found Confirmed by Dione BoozeGlick, David ([id number]) on 05/23/2020 11:49:08 PM   Radiology DG Chest Portable 1 View  Result Date: 05/23/2020 CLINICAL DATA:  Shortness of breath EXAM: PORTABLE CHEST 1 VIEW COMPARISON:  05/06/2020 FINDINGS: Mild elevation of the left hemidiaphragm. Scarring in the lungs bilaterally. No definite acute confluent opacity or effusion. Heart is normal size. IMPRESSION: Stable chronic changes.  No active disease. Electronically Signed   By: Charlett NoseKevin  Dover M.D.   On: 05/23/2020 23:08    Procedures .Critical Care Performed by: Barkley BoardsMcDonald, Beckhem Isadore A, PA-C Authorized by: Barkley BoardsMcDonald, Dalton Molesworth A, PA-C   Critical  care provider statement:    Critical care time (minutes):  40   Critical care time was exclusive of:  Separately billable procedures and treating other patients and teaching time   Critical care was necessary to treat or prevent imminent or life-threatening deterioration of the following conditions:  Respiratory failure   Critical care was time spent personally by me on the following activities:  Ordering and performing treatments and interventions, ordering and review of laboratory studies, ordering and review of radiographic studies, pulse oximetry, re-evaluation of patient's condition, review of old charts, obtaining history from patient or surrogate, examination of patient, evaluation of patient's response to treatment and development of treatment plan with patient or surrogate   I assumed direction of critical care for this patient from another provider in my specialty: no     Care discussed with: admitting provider       Medications Ordered in ED Medications  albuterol (PROVENTIL,VENTOLIN) solution continuous neb (20 mg/hr Nebulization New Bag/Given 05/23/20 2301)  lubiprostone (AMITIZA) capsule 24 mcg (has no administration in time range)  mometasone-formoterol (DULERA) 200-5 MCG/ACT inhaler 2 puff (2 puffs Inhalation Given 05/24/20 0816)  benzonatate (TESSALON) capsule 200 mg (has no administration in time range)  amLODipine (NORVASC) tablet 10 mg (has no administration in time range)  nicotine (NICODERM CQ - dosed in mg/24 hours) patch 14 mg (has no administration in time range)  tamsulosin (FLOMAX) capsule 0.4 mg (has no administration in time range)  gabapentin (NEURONTIN) capsule 300 mg (has no administration in time range)  ferrous sulfate tablet 325 mg (has no administration in time range)  loratadine (CLARITIN) tablet 10 mg (has no administration in time range)  hydrochlorothiazide (HYDRODIURIL) tablet 12.5 mg (has no administration in time range)  hydrOXYzine (ATARAX/VISTARIL)  tablet 25 mg (has no administration in time range)  metaxalone (  SKELAXIN) tablet 800 mg (has no administration in time range)  multivitamin with minerals tablet 1 tablet (has no administration in time range)  pantoprazole (PROTONIX) EC tablet 40 mg (has no administration in time range)  oxyCODONE (Oxy IR/ROXICODONE) immediate release tablet 15 mg (has no administration in time range)  enoxaparin (LOVENOX) injection 40 mg (has no administration in time range)  methylPREDNISolone sodium succinate (SOLU-MEDROL) 40 mg/mL injection 40 mg (has no administration in time range)  albuterol (PROVENTIL) (2.5 MG/3ML) 0.083% nebulizer solution 2.5 mg (has no administration in time range)  umeclidinium bromide (INCRUSE ELLIPTA) 62.5 MCG/INH 1 puff (1 puff Inhalation Given 05/24/20 0816)  metFORMIN (GLUCOPHAGE) tablet 500 mg (has no administration in time range)  feeding supplement (GLUCERNA SHAKE) (GLUCERNA SHAKE) liquid 237 mL (has no administration in time range)  acetaminophen (TYLENOL) tablet 650 mg (has no administration in time range)  senna-docusate (Senokot-S) tablet 1 tablet (has no administration in time range)  dextromethorphan-guaiFENesin (MUCINEX DM) 30-600 MG per 12 hr tablet 1 tablet (has no administration in time range)  azithromycin (ZITHROMAX) tablet 500 mg (has no administration in time range)  potassium chloride SA (KLOR-CON) CR tablet 40 mEq (has no administration in time range)  magnesium sulfate IVPB 2 g 50 mL (0 g Intravenous Stopped 05/24/20 0025)  methylPREDNISolone sodium succinate (SOLU-MEDROL) 125 mg/2 mL injection 125 mg (125 mg Intravenous Given 05/23/20 2321)  albuterol (VENTOLIN) (5 MG/ML) 0.5% continuous inhalation solution (  Given 05/23/20 2258)  oxyCODONE (Oxy IR/ROXICODONE) immediate release tablet 15 mg (15 mg Oral Given 05/24/20 0040)    ED Course  I have reviewed the triage vital signs and the nursing notes.  Pertinent labs & imaging results that were available during  my care of the patient were reviewed by me and considered in my medical decision making (see chart for details).    MDM Rules/Calculators/A&P                          60 year old male with a history of COPD, diabetes mellitus type 2, asthma, arthritis, chronic pain, HTN presenting with sudden onset, severe shortness of breath accompanied by chest tightness, intermittently productive cough, and wheezing that began this afternoon.  Mild tachycardia on arrival.  He is tachypneic.  No hypoxia.  Afebrile.  On exam, he has wheezes with inspiration and expiration.  Lung sounds are significantly diminished throughout.  Patient was started on continuous albuterol nebulizer treatment and given Solu-Medrol and magnesium.  The patient was seen and independently evaluated by Dr. Preston Fleeting, attending physician.  Patient does report that he has had smoking cessation for the last 3 weeks and has a nicotine patch in place.  Labs have been reviewed and independently interpreted by me.  He has mild hypokalemia.  Labs are overall reassuring. Chest x-ray is unremarkable.  EKG unchanged from previous.  After no improvement on albuterol continuous nebulizer, patient was transition to BiPAP with improvement in tachycardia and respiratory status.  He will require admission.  Consult to the hospitalist team and Dr. Julian Reil will accept the patient for admission for COPD exacerbation.  The patient appears reasonably stabilized for admission considering the current resources, flow, and capabilities available in the ED at this time, and I doubt any other [facility] requiring further screening and/or treatment in the ED prior to admission.   Final Clinical Impression(s) / ED Diagnoses Final diagnoses:  COPD exacerbation (HCC)    Rx / DC Orders ED Discharge Orders  None       Barkley Boards, PA-C 05/24/20 0826    Dione Booze, MD 05/24/20 2246

## 2020-05-23 NOTE — ED Triage Notes (Signed)
Pt arrived to ED via POV w/ c/o shob. Pt has hx of COPD and asthma. Pt reports non-productive dry cough and took a 2hr Toto neb treatment at home w/o relief.

## 2020-05-23 NOTE — ED Notes (Signed)
Per pt room air sat upon arrival to triage 85%.

## 2020-05-23 NOTE — ED Notes (Signed)
Went in pt's room to return monitoring equipment. Found pt hooked up to O2 and pulse oximeter on. Placed BP cuff on pt and obtained BP. Pt w/ labored breathing.

## 2020-05-23 NOTE — ED Notes (Signed)
Pt reports upon arrival to ED RA sat was 85%

## 2020-05-23 NOTE — ED Provider Notes (Incomplete)
60 year old male with history of COPD and asthma comes in with wheezing and shortness of breath which started this afternoon that did not respond to home nebulizer treatments.  On exam, he is not using accessory muscles of respiration, but lungs have diffuse wheezing.  He is currently getting a continuous nebulizer treatment.  He has been admitted to the hospital twice in the last 6 weeks for COPD exacerbations.

## 2020-05-23 NOTE — Progress Notes (Signed)
Placed patient on continuous nebulizer treatment to deliver 20mg/hr of albuterol.  

## 2020-05-24 ENCOUNTER — Telehealth: Payer: Self-pay | Admitting: Pulmonary Disease

## 2020-05-24 DIAGNOSIS — N400 Enlarged prostate without lower urinary tract symptoms: Secondary | ICD-10-CM | POA: Diagnosis present

## 2020-05-24 DIAGNOSIS — Z888 Allergy status to other drugs, medicaments and biological substances status: Secondary | ICD-10-CM | POA: Diagnosis not present

## 2020-05-24 DIAGNOSIS — D519 Vitamin B12 deficiency anemia, unspecified: Secondary | ICD-10-CM | POA: Diagnosis present

## 2020-05-24 DIAGNOSIS — J441 Chronic obstructive pulmonary disease with (acute) exacerbation: Principal | ICD-10-CM

## 2020-05-24 DIAGNOSIS — Z91013 Allergy to seafood: Secondary | ICD-10-CM | POA: Diagnosis not present

## 2020-05-24 DIAGNOSIS — Z7984 Long term (current) use of oral hypoglycemic drugs: Secondary | ICD-10-CM | POA: Diagnosis not present

## 2020-05-24 DIAGNOSIS — I10 Essential (primary) hypertension: Secondary | ICD-10-CM | POA: Diagnosis present

## 2020-05-24 DIAGNOSIS — D509 Iron deficiency anemia, unspecified: Secondary | ICD-10-CM | POA: Diagnosis present

## 2020-05-24 DIAGNOSIS — Z6823 Body mass index (BMI) 23.0-23.9, adult: Secondary | ICD-10-CM | POA: Diagnosis not present

## 2020-05-24 DIAGNOSIS — Z7951 Long term (current) use of inhaled steroids: Secondary | ICD-10-CM | POA: Diagnosis not present

## 2020-05-24 DIAGNOSIS — G8929 Other chronic pain: Secondary | ICD-10-CM

## 2020-05-24 DIAGNOSIS — Z9119 Patient's noncompliance with other medical treatment and regimen: Secondary | ICD-10-CM | POA: Diagnosis not present

## 2020-05-24 DIAGNOSIS — F17210 Nicotine dependence, cigarettes, uncomplicated: Secondary | ICD-10-CM | POA: Diagnosis present

## 2020-05-24 DIAGNOSIS — E119 Type 2 diabetes mellitus without complications: Secondary | ICD-10-CM

## 2020-05-24 DIAGNOSIS — Z20822 Contact with and (suspected) exposure to covid-19: Secondary | ICD-10-CM | POA: Diagnosis present

## 2020-05-24 DIAGNOSIS — J449 Chronic obstructive pulmonary disease, unspecified: Secondary | ICD-10-CM | POA: Diagnosis not present

## 2020-05-24 DIAGNOSIS — J9601 Acute respiratory failure with hypoxia: Secondary | ICD-10-CM

## 2020-05-24 DIAGNOSIS — K219 Gastro-esophageal reflux disease without esophagitis: Secondary | ICD-10-CM | POA: Diagnosis present

## 2020-05-24 DIAGNOSIS — Z7982 Long term (current) use of aspirin: Secondary | ICD-10-CM | POA: Diagnosis not present

## 2020-05-24 DIAGNOSIS — Z79899 Other long term (current) drug therapy: Secondary | ICD-10-CM | POA: Diagnosis not present

## 2020-05-24 DIAGNOSIS — Z8249 Family history of ischemic heart disease and other diseases of the circulatory system: Secondary | ICD-10-CM | POA: Diagnosis not present

## 2020-05-24 DIAGNOSIS — E1142 Type 2 diabetes mellitus with diabetic polyneuropathy: Secondary | ICD-10-CM | POA: Diagnosis present

## 2020-05-24 DIAGNOSIS — Z886 Allergy status to analgesic agent status: Secondary | ICD-10-CM | POA: Diagnosis not present

## 2020-05-24 DIAGNOSIS — Z833 Family history of diabetes mellitus: Secondary | ICD-10-CM | POA: Diagnosis not present

## 2020-05-24 DIAGNOSIS — R627 Adult failure to thrive: Secondary | ICD-10-CM | POA: Diagnosis present

## 2020-05-24 LAB — BASIC METABOLIC PANEL
Anion gap: 8 (ref 5–15)
Anion gap: 9 (ref 5–15)
BUN: 17 mg/dL (ref 6–20)
BUN: 14 mg/dL (ref 6–20)
CO2: 27 mmol/L (ref 22–32)
CO2: 26 mmol/L (ref 22–32)
Calcium: 8.7 mg/dL — ABNORMAL LOW (ref 8.9–10.3)
Calcium: 8.8 mg/dL — ABNORMAL LOW (ref 8.9–10.3)
Chloride: 101 mmol/L (ref 98–111)
Chloride: 101 mmol/L (ref 98–111)
Creatinine, Ser: 0.75 mg/dL (ref 0.61–1.24)
Creatinine, Ser: 0.83 mg/dL (ref 0.61–1.24)
GFR, Estimated: >60 mL/min (ref >60)
GFR, Estimated: >60 mL/min (ref >60)
Glucose, Bld: 118 mg/dL — ABNORMAL HIGH (ref 70–99)
Glucose, Bld: 203 mg/dL — ABNORMAL HIGH (ref 70–99)
Potassium: 3.4 mmol/L — ABNORMAL LOW (ref 3.5–5.1)
Potassium: 3.3 mmol/L — ABNORMAL LOW (ref 3.5–5.1)
Sodium: 136 mmol/L (ref 135–145)
Sodium: 136 mmol/L (ref 135–145)

## 2020-05-24 LAB — TROPONIN I (HIGH SENSITIVITY)
Troponin I (High Sensitivity): 7 ng/L (ref <18)
Troponin I (High Sensitivity): 8 ng/L (ref <18)

## 2020-05-24 LAB — CBC
HCT: 39.6 % (ref 39.0–52.0)
Hemoglobin: 12.9 g/dL — ABNORMAL LOW (ref 13.0–17.0)
MCH: 29.1 pg (ref 26.0–34.0)
MCHC: 32.6 g/dL (ref 30.0–36.0)
MCV: 89.4 fL (ref 80.0–100.0)
Platelets: 234 10*3/uL (ref 150–400)
RBC: 4.43 MIL/uL (ref 4.22–5.81)
RDW: 14.6 % (ref 11.5–15.5)
WBC: 6.9 10*3/uL (ref 4.0–10.5)
nRBC: 0 % (ref 0.0–0.2)

## 2020-05-24 LAB — HIV ANTIBODY (ROUTINE TESTING W REFLEX): HIV Screen 4th Generation wRfx: NONREACTIVE

## 2020-05-24 LAB — RESP PANEL BY RT-PCR (FLU A&B, COVID) ARPGX2
Influenza A by PCR: NEGATIVE
Influenza B by PCR: NEGATIVE
SARS Coronavirus 2 by RT PCR: NEGATIVE

## 2020-05-24 LAB — GLUCOSE, CAPILLARY: Glucose-Capillary: 229 mg/dL — ABNORMAL HIGH (ref 70–99)

## 2020-05-24 MED ORDER — ALBUTEROL SULFATE (2.5 MG/3ML) 0.083% IN NEBU: 2.5000 mg | INHALATION_SOLUTION | RESPIRATORY_TRACT | Status: DC | PRN

## 2020-05-24 MED ORDER — AMLODIPINE BESYLATE 10 MG PO TABS
10.0000 mg | ORAL_TABLET | Freq: Every day | ORAL | Status: DC
  Administered 2020-05-24 – 2020-05-25 (×2): 10 mg via ORAL
  Filled 2020-05-24 (×2): qty 1

## 2020-05-24 MED ORDER — GLUCERNA SHAKE PO LIQD
237.0000 mL | Freq: Three times a day (TID) | ORAL | Status: DC
  Administered 2020-05-24 – 2020-05-25 (×3): 237 mL via ORAL
  Filled 2020-05-24 (×2): qty 237

## 2020-05-24 MED ORDER — LORATADINE 10 MG PO TABS: 10.0000 mg | ORAL_TABLET | Freq: Every day | ORAL | Status: DC

## 2020-05-24 MED ORDER — ALBUTEROL SULFATE HFA 108 (90 BASE) MCG/ACT IN AERS: 2.0000 | INHALATION_SPRAY | RESPIRATORY_TRACT | Status: DC | PRN

## 2020-05-24 MED ORDER — UMECLIDINIUM BROMIDE 62.5 MCG/INH IN AEPB
1.0000 | INHALATION_SPRAY | Freq: Every day | RESPIRATORY_TRACT | Status: DC
  Administered 2020-05-24: 1 via RESPIRATORY_TRACT
  Filled 2020-05-24: qty 7

## 2020-05-24 MED ORDER — AZITHROMYCIN 250 MG PO TABS
500.0000 mg | ORAL_TABLET | Freq: Every day | ORAL | Status: DC
  Administered 2020-05-24 – 2020-05-25 (×2): 500 mg via ORAL
  Filled 2020-05-24 (×3): qty 2

## 2020-05-24 MED ORDER — POTASSIUM CHLORIDE CRYS ER 20 MEQ PO TBCR
40.0000 meq | EXTENDED_RELEASE_TABLET | Freq: Once | ORAL | Status: AC
  Administered 2020-05-24: 40 meq via ORAL
  Filled 2020-05-24: qty 2

## 2020-05-24 MED ORDER — OXYCODONE HCL 5 MG PO TABS
15.0000 mg | ORAL_TABLET | Freq: Once | ORAL | Status: AC
  Administered 2020-05-24: 15 mg via ORAL
  Filled 2020-05-24: qty 3

## 2020-05-24 MED ORDER — REVEFENACIN 175 MCG/3ML IN SOLN
175.0000 ug | Freq: Every day | RESPIRATORY_TRACT | Status: DC
  Filled 2020-05-24: qty 3

## 2020-05-24 MED ORDER — ACETAMINOPHEN 325 MG PO TABS: 650.0000 mg | ORAL_TABLET | Freq: Four times a day (QID) | ORAL | Status: DC | PRN

## 2020-05-24 MED ORDER — MONTELUKAST SODIUM 10 MG PO TABS
10.0000 mg | ORAL_TABLET | Freq: Every day | ORAL | Status: DC
  Administered 2020-05-24: 10 mg via ORAL
  Filled 2020-05-24: qty 1

## 2020-05-24 MED ORDER — TAMSULOSIN HCL 0.4 MG PO CAPS
0.4000 mg | ORAL_CAPSULE | Freq: Every evening | ORAL | Status: DC
  Administered 2020-05-24: 0.4 mg via ORAL
  Filled 2020-05-24: qty 1

## 2020-05-24 MED ORDER — LORATADINE 10 MG PO TABS
10.0000 mg | ORAL_TABLET | Freq: Every day | ORAL | Status: DC
  Administered 2020-05-24 – 2020-05-25 (×2): 10 mg via ORAL
  Filled 2020-05-24 (×2): qty 1

## 2020-05-24 MED ORDER — DM-GUAIFENESIN ER 30-600 MG PO TB12: 1.0000 | ORAL_TABLET | Freq: Two times a day (BID) | ORAL | Status: DC | PRN

## 2020-05-24 MED ORDER — BENZONATATE 100 MG PO CAPS: 200.0000 mg | ORAL_CAPSULE | Freq: Three times a day (TID) | ORAL | Status: DC | PRN

## 2020-05-24 MED ORDER — PANTOPRAZOLE SODIUM 40 MG PO TBEC
40.0000 mg | DELAYED_RELEASE_TABLET | Freq: Every day | ORAL | Status: DC
  Administered 2020-05-24 – 2020-05-25 (×2): 40 mg via ORAL
  Filled 2020-05-24 (×2): qty 1

## 2020-05-24 MED ORDER — ADULT MULTIVITAMIN W/MINERALS CH
1.0000 | ORAL_TABLET | Freq: Every day | ORAL | Status: DC
  Administered 2020-05-24 – 2020-05-25 (×2): 1 via ORAL
  Filled 2020-05-24 (×2): qty 1

## 2020-05-24 MED ORDER — NICOTINE 14 MG/24HR TD PT24
14.0000 mg | MEDICATED_PATCH | Freq: Every day | TRANSDERMAL | Status: DC
  Administered 2020-05-24 – 2020-05-25 (×2): 14 mg via TRANSDERMAL
  Filled 2020-05-24 (×2): qty 1

## 2020-05-24 MED ORDER — OXYCODONE HCL 5 MG PO TABS
15.0000 mg | ORAL_TABLET | Freq: Four times a day (QID) | ORAL | Status: DC | PRN
  Administered 2020-05-24 – 2020-05-25 (×5): 15 mg via ORAL
  Filled 2020-05-24 (×5): qty 3

## 2020-05-24 MED ORDER — LUBIPROSTONE 24 MCG PO CAPS
24.0000 ug | ORAL_CAPSULE | Freq: Every day | ORAL | Status: DC
  Administered 2020-05-24: 24 ug via ORAL
  Filled 2020-05-24 (×3): qty 1

## 2020-05-24 MED ORDER — ALBUTEROL SULFATE (2.5 MG/3ML) 0.083% IN NEBU: 2.5000 mg | INHALATION_SOLUTION | Freq: Four times a day (QID) | RESPIRATORY_TRACT | Status: DC | PRN

## 2020-05-24 MED ORDER — GABAPENTIN 300 MG PO CAPS
300.0000 mg | ORAL_CAPSULE | Freq: Three times a day (TID) | ORAL | Status: DC
Start: 2020-05-24 — End: 2020-05-25
  Administered 2020-05-24 – 2020-05-25 (×4): 300 mg via ORAL
  Filled 2020-05-24 (×4): qty 1

## 2020-05-24 MED ORDER — METAXALONE 800 MG PO TABS
800.0000 mg | ORAL_TABLET | Freq: Three times a day (TID) | ORAL | Status: DC | PRN
  Filled 2020-05-24: qty 1

## 2020-05-24 MED ORDER — SENNOSIDES-DOCUSATE SODIUM 8.6-50 MG PO TABS: 1.0000 | ORAL_TABLET | Freq: Every evening | ORAL | Status: DC | PRN

## 2020-05-24 MED ORDER — MOMETASONE FURO-FORMOTEROL FUM 200-5 MCG/ACT IN AERO
2.0000 | INHALATION_SPRAY | Freq: Two times a day (BID) | RESPIRATORY_TRACT | Status: DC
  Administered 2020-05-24 – 2020-05-25 (×2): 2 via RESPIRATORY_TRACT
  Filled 2020-05-24: qty 8.8

## 2020-05-24 MED ORDER — ENOXAPARIN SODIUM 40 MG/0.4ML ~~LOC~~ SOLN
40.0000 mg | SUBCUTANEOUS | Status: DC
  Administered 2020-05-24 – 2020-05-25 (×2): 40 mg via SUBCUTANEOUS
  Filled 2020-05-24 (×2): qty 0.4

## 2020-05-24 MED ORDER — MOMETASONE FURO-FORMOTEROL FUM 200-5 MCG/ACT IN AERO
2.0000 | INHALATION_SPRAY | Freq: Two times a day (BID) | RESPIRATORY_TRACT | Status: DC
  Administered 2020-05-24: 2 via RESPIRATORY_TRACT
  Filled 2020-05-24: qty 8.8

## 2020-05-24 MED ORDER — INSULIN ASPART 100 UNIT/ML ~~LOC~~ SOLN: 0.0000 [IU] | SUBCUTANEOUS | Status: DC

## 2020-05-24 MED ORDER — ARFORMOTEROL TARTRATE 15 MCG/2ML IN NEBU: 15.0000 ug | INHALATION_SOLUTION | Freq: Two times a day (BID) | RESPIRATORY_TRACT | Status: DC

## 2020-05-24 MED ORDER — SODIUM CHLORIDE 0.9 % IV SOLN: 1.0000 g | Freq: Every day | INTRAVENOUS | Status: DC

## 2020-05-24 MED ORDER — DIPHENHYDRAMINE HCL 25 MG PO CAPS
25.0000 mg | ORAL_CAPSULE | Freq: Every evening | ORAL | Status: DC | PRN
  Administered 2020-05-24: 25 mg via ORAL
  Filled 2020-05-24: qty 1

## 2020-05-24 MED ORDER — FERROUS SULFATE 325 (65 FE) MG PO TABS
325.0000 mg | ORAL_TABLET | Freq: Every day | ORAL | Status: DC
  Administered 2020-05-24 – 2020-05-25 (×2): 325 mg via ORAL
  Filled 2020-05-24 (×2): qty 1

## 2020-05-24 MED ORDER — HYDROXYZINE HCL 25 MG PO TABS: 25.0000 mg | ORAL_TABLET | Freq: Two times a day (BID) | ORAL | Status: DC | PRN

## 2020-05-24 MED ORDER — HYDROCHLOROTHIAZIDE 25 MG PO TABS
12.5000 mg | ORAL_TABLET | Freq: Every day | ORAL | Status: DC
  Administered 2020-05-24 – 2020-05-25 (×2): 12.5 mg via ORAL
  Filled 2020-05-24 (×2): qty 1

## 2020-05-24 MED ORDER — METFORMIN HCL 500 MG PO TABS
500.0000 mg | ORAL_TABLET | Freq: Every day | ORAL | Status: DC
  Administered 2020-05-24 – 2020-05-25 (×2): 500 mg via ORAL
  Filled 2020-05-24 (×2): qty 1

## 2020-05-24 MED ORDER — METHYLPREDNISOLONE SODIUM SUCC 40 MG IJ SOLR
40.0000 mg | Freq: Two times a day (BID) | INTRAMUSCULAR | Status: DC
  Administered 2020-05-24 (×2): 40 mg via INTRAVENOUS
  Filled 2020-05-24 (×2): qty 1

## 2020-05-24 MED ORDER — UMECLIDINIUM BROMIDE 62.5 MCG/INH IN AEPB
1.0000 | INHALATION_SPRAY | Freq: Every day | RESPIRATORY_TRACT | Status: DC
  Administered 2020-05-25: 1 via RESPIRATORY_TRACT
  Filled 2020-05-24: qty 7

## 2020-05-24 NOTE — Consult Note (Addendum)
Name: Marlene Pfluger MRN: [id number] DOB: 07/07/1960    ADMISSION DATE:  05/23/2020 CONSULTATION DATE: 05/24/2020  REFERRING MD : [facility]  CHIEF COMPLAINT: Wheezing and shortness of breath  BRIEF PATIENT DESCRIPTION: 60 year old male in no acute distress  SIGNIFICANT EVENTS  Frequent admissions for acute exacerbation of COPD  STUDIES:     HISTORY OF PRESENT ILLNESS:   60 year old male who is well-known to the pulmonary practice is to follow-up with Dr. Sherene Sires.  He was recently released from the practice due to noncompliance with smoking.  He has had lung reduction surgery in the past.  He continues to smoke despite that.  He is disabled due to his pulmonary functions and he continues to smoke up until 1 month ago.  He has seasonal allergies that tend to exacerbate his underlying COPD which is listed as a gold stage II.  He reports when he is walking he has to use his nebulizer machine if he walks too hard.  He notes that the wheezing makes him stop.  He has had multiple admissions for acute exacerbation of COPD over the last few months.  And pulmonary critical care was called to help manage him.  He has a appointment with the allergist on May 25 of this year.  He was admitted for increasing shortness of breath and wheezing he has been on and off steroids multiple times.  Currently he is in no acute distress not on oxygen satting 94% with some faint expiratory wheezes His last documented pulmonary function test were in December 2021 notable for FEV1 of 1.9 which is 36% of predicted Total lung capacity 3.9 which is 67% of predicted And a DLCO 12.71 which is 52% of Since he has quit smoking 1 month ago.  We can again see him in the pulmonary practice he may want to his physicians that assess choice.  To help establish a suitable for surgical regimen that we will keep him out of the hospital and improve the quality of his life. PAST MEDICAL HISTORY :   has a past medical history of Arthritis, Asthma,  Chronic pain (05/06/2020), COPD (chronic obstructive pulmonary disease) (HCC), Diabetes mellitus type 2 in nonobese [facility]), ED (erectile dysfunction), Hypertension, and Tobacco dependence (05/06/2020).  has a past surgical history that includes Lung surgery. Prior to Admission medications   Medication Sig Start Date End Date Taking? Authorizing Provider  albuterol (PROVENTIL HFA) 108 (90 Base) MCG/ACT inhaler Inhale 2 puffs into the lungs every 4 (four) hours as needed for wheezing or shortness of breath. 11/20/18  Yes [name], MD  albuterol (PROVENTIL) (2.5 MG/3ML) 0.083% nebulizer solution PLEASE SEE ATTACHED FOR DETAILED DIRECTIONS Patient taking differently: Take 2.5 mg by nebulization every 6 (six) hours as needed for wheezing or shortness of breath. 02/26/18  Yes [name], MD  AMITIZA 24 MCG capsule Take 24 mcg by mouth at bedtime. 03/31/20  Yes [provider]  amLODipine (NORVASC) 10 MG tablet Take 10 mg by mouth daily.   Yes [provider]  ASPIRIN LOW DOSE 81 MG EC tablet Take 81 mg by mouth daily. 04/29/19  Yes [provider]  benzonatate (TESSALON) 100 MG capsule Take 200 mg by mouth every 8 (eight) hours as needed for cough. 05/09/20  Yes [provider]  cetirizine (ZYRTEC ALLERGY) 10 MG tablet Take 1 tablet (10 mg total) by mouth 2 (two) times daily as needed for allergies (swelling). Patient taking differently: Take 10 mg by mouth at bedtime. 04/14/20  Yes Benjiman Core, MD  ferrous sulfate 325 (65 FE) MG tablet Take 1 tablet (325 mg total) by mouth daily with breakfast. 05/10/20 06/09/20 Yes Briant Cedar, MD  gabapentin (NEURONTIN) 300 MG capsule Take 1 capsule (300 mg total) by mouth 3 (three) times daily. 02/09/18  Yes Eustace Moore, MD  hydrochlorothiazide (HYDRODIURIL) 12.5 MG tablet Take 12.5 mg by mouth daily. 03/26/20  Yes [provider]  hydrOXYzine (ATARAX/VISTARIL) 25 MG tablet Take 25 mg by mouth 2 (two) times  daily as needed for anxiety. 04/14/20  Yes [provider]  metaxalone (SKELAXIN) 800 MG tablet Take 1 tablet (800 mg total) by mouth 3 (three) times daily. Patient taking differently: Take 800 mg by mouth 3 (three) times daily as needed for muscle spasms. 01/30/20  Yes Rhys Martini, PA-C  metFORMIN (GLUCOPHAGE) 500 MG tablet Take 500 mg by mouth every morning. 04/17/20  Yes [provider]  Multiple Vitamin (MULTIVITAMIN WITH MINERALS) TABS tablet Take 1 tablet by mouth daily. 05/10/20  Yes Briant Cedar, MD  nicotine (NICODERM CQ) 14 mg/24hr patch Place 1 patch (14 mg total) onto the skin daily. 05/06/20  Yes Fondaw, Wylder S, PA  nitroGLYCERIN (NITROSTAT) 0.4 MG SL tablet Place 0.4 mg under the tongue every 5 (five) minutes as needed for chest pain.   Yes [provider]  oxyCODONE (ROXICODONE) 15 MG immediate release tablet Take 15 mg by mouth every 6 (six) hours as needed for pain. 03/31/20  Yes [provider]  pantoprazole (PROTONIX) 40 MG tablet Take 1 tablet (40 mg total) by mouth daily. 07/02/19  Yes Elgergawy, Leana Roe, MD  sildenafil (VIAGRA) 100 MG tablet Take 1 tablet (100 mg total) by mouth daily as needed. Patient taking differently: Take 100 mg by mouth daily as needed for erectile dysfunction. 05/01/20  Yes   tamsulosin (FLOMAX) 0.4 MG CAPS capsule Take 0.4 mg by mouth every evening. 06/26/19  Yes [provider]  vitamin B-12 1000 MCG tablet Take 1 tablet (1,000 mcg total) by mouth daily. 05/10/20  Yes Briant Cedar, MD  Vitamin D, Ergocalciferol, (DRISDOL) 1.25 MG (50000 UNIT) CAPS capsule Take 50,000 Units by mouth once a week. Mondays 05/02/19  Yes [provider]  budesonide-formoterol (SYMBICORT) 160-4.5 MCG/ACT inhaler Inhale 2 puffs into the lungs 2 (two) times daily. Patient not taking: Reported on 05/23/2020 05/13/19   [name], MD  glipiZIDE (GLUCOTROL) 5 MG tablet Take 0.5 tablets (2.5 mg total) by mouth 2  (two) times daily for 20 days. Patient not taking: Reported on 05/23/2020 04/13/20 05/03/20  Hartley Barefoot A, MD  Tiotropium Bromide Monohydrate (SPIRIVA RESPIMAT) 2.5 MCG/ACT AERS 2 puffs each am Patient not taking: Reported on 05/23/2020 01/15/20   [name], MD   Allergies  Allergen Reactions  . Insulins Swelling  . Ibuprofen     Abdominal sensitivity  . Shrimp [Shellfish Allergy] Itching  . Tylenol [Acetaminophen]     Abdominal sensitvity  . Lisinopril Rash    Tongue swelling    FAMILY HISTORY:  family history includes Diabetes in his maternal grandmother; Hypertension in his mother. SOCIAL HISTORY:  reports that he has been smoking cigarettes. He has a 17.00 pack-year smoking history. He has never used smokeless tobacco. He reports that he does not drink alcohol and does not use drugs.  REVIEW OF SYSTEMS:   Constitutional: Negative for fever, chills, weight loss, malaise/fatigue and diaphoresis.  HENT: Negative for hearing loss, ear pain, nosebleeds, congestion, sore  throat, neck pain, tinnitus and ear discharge.   Eyes: Negative for blurred vision, double vision, photophobia, pain, discharge and redness.  Respiratory: Negative for cough, hemoptysis, sputum production, shortness of breath,+ wheezing and stridor.   Cardiovascular: Negative for chest pain, palpitations, orthopnea, claudication, leg swelling and PND.  Gastrointestinal: Negative for heartburn, nausea, vomiting, abdominal pain, diarrhea, constipation, blood in stool and melena.  Genitourinary: Negative for dysuria, urgency, frequency, hematuria and flank pain.  Musculoskeletal: Negative for myalgias, back pain, joint pain and falls.  Skin: Negative for itching and rash.  Neurological: Negative for dizziness, tingling, tremors, sensory change, speech change, focal weakness, seizures, loss of consciousness, weakness and headaches.  Endo/Heme/Allergies: Positive for environmental allergies and negative polydipsia.  Does not bruise/bleed easily.  SUBJECTIVE:  60 year old male no acute distress VITAL SIGNS: Temp:  [97.9 F (36.6 C)-98 F (36.7 C)] 98 F (36.7 C) (04/17 0830) Pulse Rate:  [93-118] 103 (04/17 0847) Resp:  [14-24] 20 (04/17 0847) BP: (126-152)/(81-103) 128/89 (04/17 0847) SpO2:  [97 %-100 %] 100 % (04/17 0847) Weight:  [64.1 kg-65.8 kg] 64.1 kg (04/17 0300)  PHYSICAL EXAMINATION: General: Thin male no acute distress Neuro: Grossly intact without focal defect HEENT: No JVD or lymphadenopathy is appreciated Cardiovascular: Heart sounds are regular rate and rhythm Lungs: Decreased excursion faint expiratory wheeze Abdomen: Soft positive bowel sounds Musculoskeletal: Grossly intact Skin: Warm and dry,chest keloids noted from lung reduction surgery  Recent Labs  Lab 05/23/20 2324 05/24/20 0342  NA 136 136  K 3.4* 3.3*  CL 101 101  CO2 27 26  BUN 17 14  CREATININE 0.75 0.83  GLUCOSE 118* 203*   Recent Labs  Lab 05/23/20 2324 05/24/20 0342  HGB 13.1 12.9*  HCT 40.7 39.6  WBC 6.4 6.9  PLT 228 234   DG Chest Portable 1 View  Result Date: 05/23/2020 CLINICAL DATA:  Shortness of breath EXAM: PORTABLE CHEST 1 VIEW COMPARISON:  05/06/2020 FINDINGS: Mild elevation of the left hemidiaphragm. Scarring in the lungs bilaterally. No definite acute confluent opacity or effusion. Heart is normal size. IMPRESSION: Stable chronic changes.  No active disease. Electronically Signed   By: Charlett Nose M.D.   On: 05/23/2020 23:08   Discussion: 60 year old male who is well-known to the pulmonary practice is to follow-up with Dr. Sherene Sires.  He was recently released from the practice due to noncompliance with smoking.  He has had lung reduction surgery in the past.  He continues to smoke despite that.  He is disabled due to his pulmonary functions and he continues to smoke up until 1 month ago.  He has seasonal allergies that tend to exacerbate his underlying COPD which is listed as a gold stage II.   He reports when he is walking he has to use his nebulizer machine if he walks too hard.  He notes that the wheezing makes him stop.  He has had multiple admissions for acute exacerbation of COPD over the last few months.  And pulmonary critical care was called to help manage him.  He has a appointment with the allergist on May 25 of this year.  He was admitted for increasing shortness of breath and wheezing he has been on and off steroids multiple times.  Currently he is in no acute distress not on oxygen satting 94% with some faint expiratory wheezes His last documented pulmonary function test were in December 2021 notable for FEV1 of 1.9 which is 36% of predicted Total lung capacity 3.9 which is 67% of predicted  And a DLCO 12.71 which is 52% of Since he has quit smoking 1 month ago.  We can again see him in the pulmonary practice he may want to his physicians that assess choice.  To help establish a suitable for surgical regimen that we will keep him out of the hospital and improve the quality of his life.  ASSESSMENT:  Principal Problem:   COPD with acute exacerbation (HCC) Continued tobacco abuse FTT  Active Problems:   COPD GOLD III/  stop smoking 1 month ago   Acute respiratory failure with hypoxemia (HCC)   Hypertension   Diabetes mellitus type 2 in nonobese [facility](HCC)   Chronic pain         PLAN: Follow-up with pulmonary practice to establish the proper pharmaceutical regimen for him with tobacco abuse resolved. May need different MD. Redo his pulmonary function test to establish a new baseline Walk test to see if his oxygen dependent with exertion.   Brett CanalesSteve Gabryela Kimbrell ACNP Acute Care Nurse Practitioner [facility] Please consult Amion 05/24/2020, 9:54 AM

## 2020-05-24 NOTE — H&P (Addendum)
History and Physical    Glenn [name] JJO:[id number] DOB: 1960/06/19 DOA: 05/23/2020  PCP: Fleet Contras, MD  Patient coming from: Home  I have personally briefly reviewed patient's old medical records in [facility] Link  Chief Complaint: Respiratory distress  HPI: Glenn [name] is a 60 y.o. male with medical history significant of HTN, DM2, COPD and asthma, s/p B lung wedge resection in 2011 (sounds like a lung volume reduction surgery).  Pt finally quit smoking ~3 weeks ago.  Pt with recent admits 3/5-3/7 and then again 3/30-4/2 for COPD exacerbations.  Pt had been doing well following this discharge, finished his prednisone taper.  Today was washing his car when he had sudden onset of wheezing, SOB.  Didn't respond to home neb treatments.  Symptoms severe, persistent.  Presented to ED.  No fevers, chills, syncope, leg swelling.   ED Course: Pt with poor air movement initially.  Given steroids, hour Corbitt 20mg  albuterol treatment, etc.  Some improvement but ultimately stabilized on rescue BIPAP.  No smoking for past 3 weeks he states.  Still has nicotine patch on.  Has pending initial consult with allergy and immunology but this appointment isnt until May.   Review of Systems: As per HPI, otherwise all review of systems negative.  Past Medical History:  Diagnosis Date  . Arthritis   . Asthma   . Chronic pain 05/06/2020  . COPD (chronic obstructive pulmonary disease) (HCC)   . Diabetes mellitus type 2 in nonobese (HCC)   . ED (erectile dysfunction)   . Hypertension   . Tobacco dependence 05/06/2020    Past Surgical History:  Procedure Laterality Date  . LUNG SURGERY     removed tissue     reports that he has been smoking cigarettes. He has a 17.00 pack-year smoking history. He has never used smokeless tobacco. He reports that he does not drink alcohol and does not use drugs.  Allergies  Allergen Reactions  . Insulins Swelling  . Ibuprofen     Abdominal  sensitivity  . Shrimp [Shellfish Allergy] Itching  . Tylenol [Acetaminophen]     Abdominal sensitvity  . Lisinopril Rash    Tongue swelling    Family History  Problem Relation Age of Onset  . Hypertension Mother   . Diabetes Maternal Grandmother   . Colon cancer Neg Hx   . Esophageal cancer Neg Hx   . Rectal cancer Neg Hx   . Inflammatory bowel disease Neg Hx   . Liver disease Neg Hx   . Pancreatic cancer Neg Hx      Prior to Admission medications   Medication Sig Start Date End Date Taking? Authorizing Provider  albuterol (PROVENTIL HFA) 108 (90 Base) MCG/ACT inhaler Inhale 2 puffs into the lungs every 4 (four) hours as needed for wheezing or shortness of breath. 11/20/18  Yes 11/22/18, MD  albuterol (PROVENTIL) (2.5 MG/3ML) 0.083% nebulizer solution PLEASE SEE ATTACHED FOR DETAILED DIRECTIONS Patient taking differently: Take 2.5 mg by nebulization every 6 (six) hours as needed for wheezing or shortness of breath. 02/26/18  Yes 02/28/18, MD  AMITIZA 24 MCG capsule Take 24 mcg by mouth at bedtime. 03/31/20  Yes [provider]  amLODipine (NORVASC) 10 MG tablet Take 10 mg by mouth daily.   Yes [provider]  ASPIRIN LOW DOSE 81 MG EC tablet Take 81 mg by mouth daily. 04/29/19  Yes [provider]  benzonatate (TESSALON) 100 MG capsule Take 200 mg by mouth every 8 (  eight) hours as needed for cough. 05/09/20  Yes [provider]  cetirizine (ZYRTEC ALLERGY) 10 MG tablet Take 1 tablet (10 mg total) by mouth 2 (two) times daily as needed for allergies (swelling). Patient taking differently: Take 10 mg by mouth at bedtime. 04/14/20  Yes Benjiman Core, MD  ferrous sulfate 325 (65 FE) MG tablet Take 1 tablet (325 mg total) by mouth daily with breakfast. 05/10/20 06/09/20 Yes Briant Cedar, MD  gabapentin (NEURONTIN) 300 MG capsule Take 1 capsule (300 mg total) by mouth 3 (three) times daily. 02/09/18  Yes Eustace Moore, MD   hydrochlorothiazide (HYDRODIURIL) 12.5 MG tablet Take 12.5 mg by mouth daily. 03/26/20  Yes [provider]  hydrOXYzine (ATARAX/VISTARIL) 25 MG tablet Take 25 mg by mouth 2 (two) times daily as needed for anxiety. 04/14/20  Yes [provider]  metaxalone (SKELAXIN) 800 MG tablet Take 1 tablet (800 mg total) by mouth 3 (three) times daily. Patient taking differently: Take 800 mg by mouth 3 (three) times daily as needed for muscle spasms. 01/30/20  Yes Rhys Martini, PA-C  metFORMIN (GLUCOPHAGE) 500 MG tablet Take 500 mg by mouth every morning. 04/17/20  Yes [provider]  Multiple Vitamin (MULTIVITAMIN WITH MINERALS) TABS tablet Take 1 tablet by mouth daily. 05/10/20  Yes Briant Cedar, MD  nicotine (NICODERM CQ) 14 mg/24hr patch Place 1 patch (14 mg total) onto the skin daily. 05/06/20  Yes Fondaw, Wylder S, PA  nitroGLYCERIN (NITROSTAT) 0.4 MG SL tablet Place 0.4 mg under the tongue every 5 (five) minutes as needed for chest pain.   Yes [provider]  oxyCODONE (ROXICODONE) 15 MG immediate release tablet Take 15 mg by mouth every 6 (six) hours as needed for pain. 03/31/20  Yes [provider]  pantoprazole (PROTONIX) 40 MG tablet Take 1 tablet (40 mg total) by mouth daily. 07/02/19  Yes Elgergawy, Leana Roe, MD  sildenafil (VIAGRA) 100 MG tablet Take 1 tablet (100 mg total) by mouth daily as needed. Patient taking differently: Take 100 mg by mouth daily as needed for erectile dysfunction. 05/01/20  Yes   tamsulosin (FLOMAX) 0.4 MG CAPS capsule Take 0.4 mg by mouth every evening. 06/26/19  Yes [provider]  vitamin B-12 1000 MCG tablet Take 1 tablet (1,000 mcg total) by mouth daily. 05/10/20  Yes Briant Cedar, MD  Vitamin D, Ergocalciferol, (DRISDOL) 1.25 MG (50000 UNIT) CAPS capsule Take 50,000 Units by mouth once a week. Mondays 05/02/19  Yes [provider]  budesonide-formoterol (SYMBICORT) 160-4.5 MCG/ACT inhaler Inhale 2  puffs into the lungs 2 (two) times daily. Patient not taking: Reported on 05/23/2020 05/13/19   [name], MD  glipiZIDE (GLUCOTROL) 5 MG tablet Take 0.5 tablets (2.5 mg total) by mouth 2 (two) times daily for 20 days. Patient not taking: Reported on 05/23/2020 04/13/20 05/03/20  Hartley Barefoot A, MD  Tiotropium Bromide Monohydrate (SPIRIVA RESPIMAT) 2.5 MCG/ACT AERS 2 puffs each am Patient not taking: Reported on 05/23/2020 01/15/20   [name], MD    Physical Exam: Vitals:   05/24/20 0023 05/24/20 0030 05/24/20 0045 05/24/20 0100  BP:  (!) 141/92 (!) 152/94 (!) 146/96  Pulse: 93 (!) 112 (!) 112 (!) 109  Resp: 19 17 (!) 24 17  Temp:      TempSrc:      SpO2: 99% 99% 98% 99%  Weight:      Height:        Constitutional: NAD, calm,  comfortable, tolerating BIPAP well Eyes: PERRL, lids and conjunctivae normal ENMT: Mucous membranes are moist. Posterior pharynx clear of any exudate or lesions.Normal dentition.  Neck: normal, supple, no masses, no thyromegaly Respiratory: Diminished BS bilaterally. Cardiovascular: Regular rate and rhythm, no murmurs / rubs / gallops. No extremity edema. 2+ pedal pulses. No carotid bruits.  Abdomen: no tenderness, no masses palpated. No hepatosplenomegaly. Bowel sounds positive.  Musculoskeletal: no clubbing / cyanosis. No joint deformity upper and lower extremities. Good ROM, no contractures. Normal muscle tone.  Skin: no rashes, lesions, ulcers. No induration Neurologic: CN 2-12 grossly intact. Sensation intact, DTR normal. Strength 5/5 in all 4.  Psychiatric: Normal judgment and insight. Alert and oriented x 3. Normal mood.    Labs on Admission: I have personally reviewed following labs and imaging studies  CBC: Recent Labs  Lab 05/23/20 2324  WBC 6.4  NEUTROABS 3.6  HGB 13.1  HCT 40.7  MCV 89.1  PLT 228   Basic Metabolic Panel: Recent Labs  Lab 05/23/20 2324  NA 136  K 3.4*  CL 101  CO2 27  GLUCOSE 118*  BUN 17  CREATININE  0.75  CALCIUM 8.7*   GFR: Estimated Creatinine Clearance: 85.4 mL/min (by C-G formula based on SCr of 0.75 mg/dL). Liver Function Tests: No results for input(s): AST, ALT, ALKPHOS, BILITOT, PROT, ALBUMIN in the last 168 hours. No results for input(s): LIPASE, AMYLASE in the last 168 hours. No results for input(s): AMMONIA in the last 168 hours. Coagulation Profile: No results for input(s): INR, PROTIME in the last 168 hours. Cardiac Enzymes: No results for input(s): CKTOTAL, CKMB, CKMBINDEX, TROPONINI in the last 168 hours. BNP (last 3 results) No results for input(s): PROBNP in the last 8760 hours. HbA1C: No results for input(s): HGBA1C in the last 72 hours. CBG: No results for input(s): GLUCAP in the last 168 hours. Lipid Profile: No results for input(s): CHOL, HDL, LDLCALC, TRIG, CHOLHDL, LDLDIRECT in the last 72 hours. Thyroid Function Tests: No results for input(s): TSH, T4TOTAL, FREET4, T3FREE, THYROIDAB in the last 72 hours. Anemia Panel: No results for input(s): VITAMINB12, FOLATE, FERRITIN, TIBC, IRON, RETICCTPCT in the last 72 hours. Urine analysis: No results found for: COLORURINE, APPEARANCEUR, LABSPEC, PHURINE, GLUCOSEU, HGBUR, BILIRUBINUR, KETONESUR, PROTEINUR, UROBILINOGEN, NITRITE, LEUKOCYTESUR  Radiological Exams on Admission: DG Chest Portable 1 View  Result Date: 05/23/2020 CLINICAL DATA:  Shortness of breath EXAM: PORTABLE CHEST 1 VIEW COMPARISON:  05/06/2020 FINDINGS: Mild elevation of the left hemidiaphragm. Scarring in the lungs bilaterally. No definite acute confluent opacity or effusion. Heart is normal size. IMPRESSION: Stable chronic changes.  No active disease. Electronically Signed   By: Charlett Nose M.D.   On: 05/23/2020 23:08    EKG: Independently reviewed.  Assessment/Plan Principal Problem:   COPD with acute exacerbation (HCC) Active Problems:   COPD GOLD III/ actively smoking    Acute respiratory failure with hypoxemia (HCC)   Hypertension    Diabetes mellitus type 2 in nonobese (HCC)   Chronic pain    1. COPD exacerbation - With acute hypoxic resp failure 1. COPD pathway 2. On rescue BIPAP 3. Rocephin 4. Scheduled LABA/LAMA/inh steroid 5. PRN SABA 6. Solumedrol for now (swap to prednisone when able to take POs, hopefully later this AM). 7. Cont pulse ox 8. Tele monitor 2. HTN - 1. Cont home BP meds 3. Chronic pain - 1. Cont home oxy, neurontin, skelaxin 4. DM2 - 1. Cont home metformin and monitor Q4H for the moment 2. Pt with allergy (  tongue swells) to insulin, not sure which one(s) though.  DVT prophylaxis: Lovenox Code Status: Full Family Communication: No family in room Disposition Plan: Home after Breathing improved Consults called: Sent message to Dr. Warrick Parisiangan to pass on to day team PCCM for pulm consult during day time Admission status: Admit to inpatient  Severity of Illness: The appropriate patient status for this patient is INPATIENT. Inpatient status is judged to be reasonable and necessary in order to provide the required intensity of service to ensure the patient's safety. The patient's presenting symptoms, physical exam findings, and initial radiographic and laboratory data in the context of their chronic comorbidities is felt to place them at high risk for further clinical deterioration. Furthermore, it is not anticipated that the patient will be medically stable for discharge from the hospital within 2 midnights of admission. The following factors support the patient status of inpatient.   Acute resp failure with hypoxia requiring rescue BIPAP to stabilize.   * I certify that at the point of admission it is my clinical judgment that the patient will require inpatient hospital care spanning beyond 2 midnights from the point of admission due to high intensity of service, high risk for further deterioration and high frequency of surveillance required.*    Jonna Dittrich M. DO [facility]  How to  contact the [facility] Attending or Consulting provider 7A - 7P or covering provider during after hours 7P -7A, for this patient?  1. Check the care team in [facility] and look for a) attending/consulting [facility] provider listed and b) the [name] team listed 2. Log into [email or web address]  Amion Physician Scheduling and messaging for groups and whole hospitals  On call and physician scheduling software for group practices, residents, hospitalists and other medical providers for call, clinic, rotation and shift schedules. On[employer] is a hospital-wide system for scheduling doctors and paging doctors on call. EasyPlot is for scientific plotting and data analysis.  [email or web address]  and use [place]'s universal password to access. If you do not have the password, please contact the hospital operator.  3. Locate the [facility] provider you are looking for under [facility] and page to a number that you can be directly reached. 4. If you still have difficulty reaching the provider, please page the [facility] (Director on Call) for the Hospitalists listed on amion for assistance.  05/24/2020, 1:57 AM

## 2020-05-24 NOTE — Progress Notes (Signed)
PROGRESS NOTE    Glenn [name]  VWP:[id number] DOB: 12-20-1960 DOA: 05/23/2020 PCP: Fleet Contras, MD   Brief Narrative:  60 year old with history of HTN, DM2, COPD, asthma, lung wedge resection 2011 claims to have quit smoking 3 weeks ago admitted for shortness of breath with concerns for COPD exacerbation.  This is his third admission in the last 6 weeks.  Pulmonary team consulted.   Assessment & Plan:   Principal Problem:   COPD with acute exacerbation (HCC) Active Problems:   COPD GOLD III/ actively smoking    Acute respiratory failure with hypoxemia (HCC)   Hypertension   Diabetes mellitus type 2 in nonobese [facility])   Chronic pain  Acute COPD exacerbation -Chest x-ray is negative with chronic changes -Solu-Medrol 40 mg every 12 hours -Scheduled and as needed bronchodilators -Echocardiogram shows EF 55-60%, grade 1 diastolic dysfunction -Pulmonary consulted -Empiric Rocephin  Essential hypertension -Norvasc, HCTZ  Chronic pain -As needed oxycodone, metaxalone  Diabetes mellitus type 2 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  Allergy to insulin?Marland Kitchen  Would benefit from outpatient endocrine referral -Accu-Cheks -Gabapentin  Iron deficiency anemia/B12 deficiency -Iron supplements with bowel regimen  BPH -Flomax  GERD -PPI  DVT prophylaxis: Lovenox Code Status: Full code Family Communication:    Status is: Inpatient  Remains inpatient appropriate because:Inpatient level of care appropriate due to severity of illness.  Still quite short of breath requiring aggressive bronchodilators and IV steroids   Dispo: The patient is from: Home              Anticipated d/c is to: Home              Patient currently is not medically stable to d/c.   Difficult to place patient No       Nutritional status  Nutrition Problem: Increased nutrient needs Etiology: chronic illness (COPD)  Signs/Symptoms: estimated needs  Interventions: Glucerna  shake,MVI,Magic cup  Body mass index is 23.53 kg/m.       Subjective: Feels better during my visit at rest but afraid he will get sob with ambulation.  While in the room I was able to take him off o2, his O2 maintained >95%  Review of Systems Otherwise negative except as per HPI, including: General: Denies fever, chills, night sweats or unintended weight loss. Resp: Denies cough, wheezing, shortness of breath. Cardiac: Denies chest pain, palpitations, orthopnea, paroxysmal nocturnal dyspnea. GI: Denies abdominal pain, nausea, vomiting, diarrhea or constipation GU: Denies dysuria, frequency, hesitancy or incontinence MS: Denies muscle aches, joint pain or swelling Neuro: Denies headache, neurologic deficits (focal weakness, numbness, tingling), abnormal gait Psych: Denies anxiety, depression, SI/HI/AVH Skin: Denies new rashes or lesions ID: Denies sick contacts, exotic exposures, travel  Examination:  General exam: Appears calm and comfortable  Respiratory system: b/l diminishes BS Cardiovascular system: S1 & S2 heard, RRR. No JVD, murmurs, rubs, gallops or clicks. No pedal edema. Gastrointestinal system: Abdomen is nondistended, soft and nontender. No organomegaly or masses felt. Normal bowel sounds heard. Central nervous system: Alert and oriented. No focal neurological deficits. Extremities: Symmetric 5 x 5 power. Skin: No rashes, lesions or ulcers Psychiatry: Judgement and insight appear normal. Mood & affect appropriate.     Objective: Vitals:   05/24/20 0215 05/24/20 0300 05/24/20 0435 05/24/20 0600  BP: 140/88 135/81  132/88  Pulse: (!) 102 98 99 98  Resp: 16 16 14 18   Temp:  97.9 F (36.6 C)    TempSrc:  Oral    SpO2: 98% 97% 98%  98%  Weight:  64.1 kg    Height:  5\' 5"  (1.651 m)      Intake/Output Summary (Last 24 hours) at 05/24/2020 0746 Last data filed at 05/24/2020 0300 Gross per 24 hour  Intake 50 ml  Output 250 ml  Net -200 ml   Filed Weights    05/23/20 2304 05/24/20 0300  Weight: 65.8 kg 64.1 kg     Data Reviewed:   CBC: Recent Labs  Lab 05/23/20 2324 05/24/20 0342  WBC 6.4 6.9  NEUTROABS 3.6  --   HGB 13.1 12.9*  HCT 40.7 39.6  MCV 89.1 89.4  PLT 228 234   Basic Metabolic Panel: Recent Labs  Lab 05/23/20 2324 05/24/20 0342  NA 136 136  K 3.4* 3.3*  CL 101 101  CO2 27 26  GLUCOSE 118* 203*  BUN 17 14  CREATININE 0.75 0.83  CALCIUM 8.7* 8.8*   GFR: Estimated Creatinine Clearance: 82.3 mL/min (by C-G formula based on SCr of 0.83 mg/dL). Liver Function Tests: No results for input(s): AST, ALT, ALKPHOS, BILITOT, PROT, ALBUMIN in the last 168 hours. No results for input(s): LIPASE, AMYLASE in the last 168 hours. No results for input(s): AMMONIA in the last 168 hours. Coagulation Profile: No results for input(s): INR, PROTIME in the last 168 hours. Cardiac Enzymes: No results for input(s): CKTOTAL, CKMB, CKMBINDEX, TROPONINI in the last 168 hours. BNP (last 3 results) No results for input(s): PROBNP in the last 8760 hours. HbA1C: No results for input(s): HGBA1C in the last 72 hours. CBG: Recent Labs  Lab 05/24/20 0559  GLUCAP 229*   Lipid Profile: No results for input(s): CHOL, HDL, LDLCALC, TRIG, CHOLHDL, LDLDIRECT in the last 72 hours. Thyroid Function Tests: No results for input(s): TSH, T4TOTAL, FREET4, T3FREE, THYROIDAB in the last 72 hours. Anemia Panel: No results for input(s): VITAMINB12, FOLATE, FERRITIN, TIBC, IRON, RETICCTPCT in the last 72 hours. Sepsis Labs: No results for input(s): PROCALCITON, LATICACIDVEN in the last 168 hours.  Recent Results (from the past 240 hour(s))  Resp Panel by RT-PCR (Flu A&B, Covid) Nasopharyngeal Swab     Status: None   Collection Time: 05/23/20  1:10 AM   Specimen: Nasopharyngeal Swab; Nasopharyngeal(NP) swabs in vial transport medium  Result Value Ref Range Status   SARS Coronavirus 2 by RT PCR NEGATIVE NEGATIVE Final    Comment: (NOTE) SARS-CoV-2  target nucleic acids are NOT DETECTED.  The SARS-CoV-2 RNA is generally detectable in upper respiratory specimens during the acute phase of infection. The lowest concentration of SARS-CoV-2 viral copies this assay can detect is 138 copies/mL. A negative result does not preclude SARS-Cov-2 infection and should not be used as the sole basis for treatment or other patient management decisions. A negative result may occur with  improper specimen collection/handling, submission of specimen other than nasopharyngeal swab, presence of viral mutation(s) within the areas targeted by this assay, and inadequate number of viral copies(<138 copies/mL). A negative result must be combined with clinical observations, patient history, and epidemiological information. The expected result is Negative.  Fact Sheet for Patients:  05/25/20  Fact Sheet for Healthcare Providers:  BloggerCourse.com  This test is no t yet approved or cleared by the [place] FDA and  has been authorized for detection and/or diagnosis of SARS-CoV-2 by FDA under an Emergency Use Authorization (EUA). This EUA will remain  in effect (meaning this test can be used) for the duration of the COVID-19 declaration under Section 564(b)(1) of the Act, 21 U.S.C.section 360bbb-3(b)(1),  unless the authorization is terminated  or revoked sooner.       Influenza A by PCR NEGATIVE NEGATIVE Final   Influenza B by PCR NEGATIVE NEGATIVE Final    Comment: (NOTE) The Xpert Xpress SARS-CoV-2/FLU/RSV plus assay is intended as an aid in the diagnosis of influenza from Nasopharyngeal swab specimens and should not be used as a sole basis for treatment. Nasal washings and aspirates are unacceptable for Xpert Xpress SARS-CoV-2/FLU/RSV testing.  Fact Sheet for Patients: BloggerCourse.com  Fact Sheet for Healthcare  Providers: SeriousBroker.it  This test is not yet approved or cleared by the [place] FDA and has been authorized for detection and/or diagnosis of SARS-CoV-2 by FDA under an Emergency Use Authorization (EUA). This EUA will remain in effect (meaning this test can be used) for the duration of the COVID-19 declaration under Section 564(b)(1) of the Act, 21 U.S.C. section 360bbb-3(b)(1), unless the authorization is terminated or revoked.  Performed at [facility], [address], [place], [place] [address]          Radiology Studies: DG Chest Portable 1 View  Result Date: 05/23/2020 CLINICAL DATA:  Shortness of breath EXAM: PORTABLE CHEST 1 VIEW COMPARISON:  05/06/2020 FINDINGS: Mild elevation of the left hemidiaphragm. Scarring in the lungs bilaterally. No definite acute confluent opacity or effusion. Heart is normal size. IMPRESSION: Stable chronic changes.  No active disease. Electronically Signed   By: Charlett Nose M.D.   On: 05/23/2020 23:08        Scheduled Meds: . amLODipine  10 mg Oral Daily  . enoxaparin (LOVENOX) injection  40 mg Subcutaneous Q24H  . feeding supplement (GLUCERNA SHAKE)  237 mL Oral TID BM  . ferrous sulfate  325 mg Oral Q breakfast  . gabapentin  300 mg Oral TID  . hydrochlorothiazide  12.5 mg Oral Daily  . loratadine  10 mg Oral QHS  . lubiprostone  24 mcg Oral QHS  . metFORMIN  500 mg Oral Q breakfast  . methylPREDNISolone (SOLU-MEDROL) injection  40 mg Intravenous Q12H  . mometasone-formoterol  2 puff Inhalation BID  . multivitamin with minerals  1 tablet Oral Daily  . nicotine  14 mg Transdermal Daily  . pantoprazole  40 mg Oral Daily  . tamsulosin  0.4 mg Oral QPM  . umeclidinium bromide  1 puff Inhalation Daily   Continuous Infusions: . albuterol 20 mg/hr (05/23/20 2301)  . cefTRIAXone (ROCEPHIN)  IV       LOS: 0 days   Time spent= 35 mins    Aniela Caniglia Joline Maxcy, MD [facility]  If  7PM-7AM, please contact night-coverage  05/24/2020, 7:46 AM

## 2020-05-24 NOTE — Telephone Encounter (Signed)
Please schedule patient for hospital follow up visit with myself or with an NP in 2 weeks for COPD exacerbation. Also please schedule him for a home sleep study.   Please let me know the appointment date and time so I can add it to his discharge summary.  Thank you, Cletis Athens

## 2020-05-24 NOTE — Progress Notes (Signed)
Patient still c/o being short of breath after an hour of 20mg /hr albuterol neb. Talked with P.A about placing patient on bipap. B/S decreased with expiratory wheezes.

## 2020-05-24 NOTE — Progress Notes (Signed)
Initial Nutrition Assessment  DOCUMENTATION CODES:   Not applicable  INTERVENTION:   -MVI with minerals daily -Glucerna Shake po TID, each supplement provides 220 kcal and 10 grams of protein -Magic cup TID with meals, each supplement provides 290 kcal and 9 grams of protein -Paged DM coordinator to optimize glycemic control regimen  NUTRITION DIAGNOSIS:   Increased nutrient needs related to chronic illness (COPD) as evidenced by estimated needs.  GOAL:   Patient will meet greater than or equal to 90% of their needs  MONITOR:   PO intake,Supplement acceptance,Diet advancement,Labs,Weight trends,Skin,I & O's  REASON FOR ASSESSMENT:   Consult Assessment of nutrition requirement/status  ASSESSMENT:   Glenn [name] is a 60 y.o. male with medical history significant of HTN, DM2, COPD and asthma, s/p B lung wedge resection in 2011 (sounds like a lung volume reduction surgery).  Pt admitted with COPD exacerbation.   Reviewed I/O's: -200 ml x 24 hours  UOP: 250 ml x 24 hours  Pt unavailable at time of visit. Attempted to speak with pt via call to hospital room phone, however, unable to reach.  No meal completions currently available at this time to assess.   Reviewed wt hx; wt has been stable over the past 6 months.   Pt identified with moderate malnutrition in the context of chronic illness during prior admission. Suspect malnutrition is ongoing, however, RD unable to identify at this time. Pt would greatly benefit from addition of oral nutrition supplements.   Medications reviewed and include ferrous sulfate and solu-medrol.   Lab Results  Component Value Date   HGBA1C 6.6 (H) 04/12/2020   PTA DM medications are 500 mg metformin daily.   Labs reviewed: CBGS: 229 (inpatient orders for glycemic control are 500 mg metformin daily). Pt on steroids, which is likely contributing to hyperglycemia.   Diet Order:   Diet Order            Diet heart healthy/carb modified Room  service appropriate? Yes; Fluid consistency: Thin  Diet effective now                 EDUCATION NEEDS:   No education needs have been identified at this time  Skin:  Skin Assessment: Reviewed RN Assessment  Last BM:  05/23/20  Height:   Ht Readings from Last 1 Encounters:  05/24/20 5\' 5"  (1.651 m)    Weight:   Wt Readings from Last 1 Encounters:  05/24/20 64.1 kg    Ideal Body Weight:  61.2 kg  BMI:  Body mass index is 23.53 kg/m.  Estimated Nutritional Needs:   Kcal:  1900-2100  Protein:  95-110 grams  Fluid:  > 1.9 L    05/26/20, RD, LDN, CDCES Registered Dietitian II Certified Diabetes Care and Education Specialist Please refer to [facility] for RD and/or RD on-call/weekend/after hours pager

## 2020-05-24 NOTE — Evaluation (Signed)
Physical Therapy Evaluation Patient Details Name: Glenn [name] MRN: [id number] DOB: 07-Feb-1961 Today's Date: 05/24/2020   History of Present Illness  Patient is a 60 y/o male who presents with SOB on 05/24/20. Admitted with COPD exacerbation. This is his third admission in the last 6 weeks (3/5-3/7, 3/30-4/2 most recently). PMH includes COPD, HTN, recently quit smoking, DM.  Clinical Impression  Patient reports being independent for ADLs/IADLs and working PTA. Today, pt tolerated transfers and ambulation with Mod I-independent for safety. Pt needs cues to decrease gait speed and to self monitor for symptoms, take needed rest breaks and for pursed lip breathing. Sp02 initially dropped to 86% on RA however for the rest of ambulation bout, able to maintain Sp02 >88%. Encouraged walking in hallway as able- asked RN for telemetry box to make this easier. Pt does not require skilled therapy services as pt functioning at baseline. All education completed. Discharge from therapy.    Follow Up Recommendations No PT follow up    Equipment Recommendations  None recommended by PT    Recommendations for Other Services       Precautions / Restrictions Precautions Precautions: Other (comment) Precaution Comments: watch 02 Restrictions Weight Bearing Restrictions: No      Mobility  Bed Mobility Overal bed mobility: Independent                  Transfers Overall transfer level: Independent Equipment used: None                Ambulation/Gait Ambulation/Gait assistance: Independent Gait Distance (Feet): 1500 Feet Assistive device: None Gait Pattern/deviations: WFL(Within Functional Limits)   Gait velocity interpretation: >4.37 ft/sec, indicative of normal walking speed General Gait Details: Needs cues to decrease speed and to self monitor for dyspnea symptoms and pursed lip breathing. 4 forced rest breaks. 2-3/4 DOE. Sp02 initially dropped to 86% but able to maintain >88% rest of  time ambulating on RA. HR 116 bpm.  Stairs            Wheelchair Mobility    Modified Rankin (Stroke Patients Only)       Balance Overall balance assessment: No apparent balance deficits (not formally assessed)                                           Pertinent Vitals/Pain Pain Assessment: Faces Faces Pain Scale: Hurts a little bit Pain Location: back-chronic Pain Descriptors / Indicators: Sore Pain Intervention(s): Monitored during session;Repositioned    Home Living Family/patient expects to be discharged to:: Private residence Living Arrangements:  (fiance) Available Help at Discharge: Family;Available PRN/intermittently Type of Home: Apartment Home Access: Level entry   Entrance Stairs-Number of Steps: 12 Home Layout: One level Home Equipment: None      Prior Function Level of Independence: Independent         Comments: Owns boat business     Higher education careers adviser        Extremity/Trunk Assessment   Upper Extremity Assessment Upper Extremity Assessment: Defer to OT evaluation;Overall [facility] for tasks assessed    Lower Extremity Assessment Lower Extremity Assessment: Overall WFL for tasks assessed    Cervical / Trunk Assessment Cervical / Trunk Assessment: Normal  Communication   Communication: No difficulties  Cognition Arousal/Alertness: Awake/alert Behavior During Therapy: WFL for tasks assessed/performed Overall Cognitive Status: Within Functional Limits for tasks assessed  General Comments General comments (skin integrity, edema, etc.): Sp02 ranging from 86-95% on RA, mostly >88% with activity.    Exercises     Assessment/Plan    PT Assessment Patent does not need any further PT services  PT Problem List         PT Treatment Interventions      PT Goals (Current goals can be found in the Care Plan section)  Acute Rehab PT Goals Patient Stated Goal: to get  home by tomorrow PT Goal Formulation: All assessment and education complete, DC therapy    Frequency     Barriers to discharge        Co-evaluation               AM-PAC PT "6 Clicks" Mobility  Outcome Measure Help needed turning from your back to your side while in a flat bed without using bedrails?: None Help needed moving from lying on your back to sitting on the side of a flat bed without using bedrails?: None Help needed moving to and from a bed to a chair (including a wheelchair)?: None Help needed standing up from a chair using your arms (e.g., wheelchair or bedside chair)?: None Help needed to walk in hospital room?: None Help needed climbing 3-5 steps with a railing? : None 6 Click Score: 24    End of Session   Activity Tolerance: Patient tolerated treatment well Patient left: in bed;with call bell/phone within reach Nurse Communication: Mobility status;Other (comment) (be placed on telemetry box to be able to walk independently) PT Visit Diagnosis: Other (comment) (dyspnea)    Time: 5427-0623 PT Time Calculation (min) (ACUTE ONLY): 22 min   Charges:   PT Evaluation $PT Eval Moderate Complexity: 1 Mod          Vale Haven, PT, DPT Acute Rehabilitation Services Pager [phone] Office [phone]      Blake Divine A Lanier Ensign 05/24/2020, 12:37 PM

## 2020-05-25 ENCOUNTER — Other Ambulatory Visit (HOSPITAL_COMMUNITY): Payer: Self-pay

## 2020-05-25 LAB — BASIC METABOLIC PANEL
Anion gap: 6 (ref 5–15)
BUN: 19 mg/dL (ref 6–20)
CO2: 28 mmol/L (ref 22–32)
Calcium: 9.5 mg/dL (ref 8.9–10.3)
Chloride: 100 mmol/L (ref 98–111)
Creatinine, Ser: 0.91 mg/dL (ref 0.61–1.24)
GFR, Estimated: >60 mL/min (ref >60)
Glucose, Bld: 210 mg/dL — ABNORMAL HIGH (ref 70–99)
Potassium: 4.4 mmol/L (ref 3.5–5.1)
Sodium: 134 mmol/L — ABNORMAL LOW (ref 135–145)

## 2020-05-25 LAB — CBC
HCT: 37.5 % — ABNORMAL LOW (ref 39.0–52.0)
Hemoglobin: 12 g/dL — ABNORMAL LOW (ref 13.0–17.0)
MCH: 28.4 pg (ref 26.0–34.0)
MCHC: 32 g/dL (ref 30.0–36.0)
MCV: 88.9 fL (ref 80.0–100.0)
Platelets: 225 10*3/uL (ref 150–400)
RBC: 4.22 MIL/uL (ref 4.22–5.81)
RDW: 14.8 % (ref 11.5–15.5)
WBC: 9.9 10*3/uL (ref 4.0–10.5)
nRBC: 0 % (ref 0.0–0.2)

## 2020-05-25 LAB — PROCALCITONIN: Procalcitonin: <0.1 ng/mL

## 2020-05-25 LAB — BRAIN NATRIURETIC PEPTIDE: B Natriuretic Peptide: 27.5 pg/mL (ref 0.0–100.0)

## 2020-05-25 LAB — MAGNESIUM: Magnesium: 2.1 mg/dL (ref 1.7–2.4)

## 2020-05-25 MED ORDER — AZITHROMYCIN 250 MG PO TABS
250.0000 mg | ORAL_TABLET | Freq: Every day | ORAL | 0 refills | Status: AC
  Filled 2020-05-25: qty 3, 3d supply, fill #0

## 2020-05-25 MED ORDER — PREDNISONE 20 MG PO TABS
ORAL_TABLET | ORAL | 0 refills | Status: AC
  Filled 2020-05-25: qty 15, 12d supply, fill #0

## 2020-05-25 MED ORDER — PREDNISONE 20 MG PO TABS
40.0000 mg | ORAL_TABLET | Freq: Every day | ORAL | Status: DC
  Administered 2020-05-25: 40 mg via ORAL
  Filled 2020-05-25: qty 2

## 2020-05-25 MED ORDER — MONTELUKAST SODIUM 10 MG PO TABS
10.0000 mg | ORAL_TABLET | Freq: Every day | ORAL | 0 refills | Status: DC
  Filled 2020-05-25: qty 30, 30d supply, fill #0

## 2020-05-25 NOTE — Telephone Encounter (Signed)
Patient has been scheduled with Tammy Parrett  Next Appt With Pulmonology Rubye Oaks, NP) 06/08/2020 at 10:00 AM

## 2020-05-25 NOTE — Progress Notes (Signed)
BiPAP is in pt room at bedside ready for use. Pt stated he did not need BiPAP at this time but wanted BiPAP at bedside "just in case he needed it" RT informed pt if he want to wear BiPAP at any point in the night to call for RT and would place pt on for the night.

## 2020-05-25 NOTE — TOC Initial Note (Signed)
Transition of Care [facility]) - Initial/Assessment Note    Patient Details  Name: Glenn [name] MRN: [id number] Date of Birth: 08/05/1960  Transition of Care [facility]) CM/SW Contact:    Leone Haven, RN Phone Number: 05/25/2020, 11:59 AM  Clinical Narrative:                 Patient is for dc today.  He has no needs.  Expected Discharge Plan: Home/Self Care Barriers to Discharge: No Barriers Identified   Patient Goals and CMS Choice Patient states their goals for this hospitalization and ongoing recovery are:: return home   Choice offered to / list presented to : NA  Expected Discharge Plan and Services Expected Discharge Plan: Home/Self Care   Discharge Planning Services: CM Consult Post Acute Care Choice: NA Living arrangements for the past 2 months: Single Family Home Expected Discharge Date: 05/25/20                 DME Agency: NA       HH Arranged: NA          Prior Living Arrangements/Services Living arrangements for the past 2 months: Single Family Home Lives with:: Spouse Patient language and need for interpreter reviewed:: Yes Do you feel safe going back to the place where you live?: Yes      Need for Family Participation in Patient Care: No (Comment) Care giver support system in place?: No (comment)   Criminal Activity/Legal Involvement Pertinent to Current Situation/Hospitalization: No - Comment as needed  Activities of Daily Living Home Assistive Devices/Equipment: Eyeglasses ADL Screening (condition at time of admission) Patient's cognitive ability adequate to safely complete daily activities?: Yes Is the patient deaf or have difficulty hearing?: No Does the patient have difficulty seeing, even when wearing glasses/contacts?: No Does the patient have difficulty concentrating, remembering, or making decisions?: No Patient able to express need for assistance with ADLs?: No Does the patient have difficulty dressing or bathing?: No Independently  performs ADLs?: Yes (appropriate for developmental age) Does the patient have difficulty walking or climbing stairs?: Yes Weakness of Legs: Both Weakness of Arms/Hands: None  Permission Sought/Granted                  Emotional Assessment Appearance:: Appears stated age Attitude/Demeanor/Rapport: Engaged Affect (typically observed): Appropriate Orientation: : Oriented to  Time,Oriented to Situation,Oriented to Place,Oriented to Self Alcohol / Substance Use: Not Applicable Psych Involvement: No (comment)  Admission diagnosis:  COPD with acute exacerbation (HCC) [J44.1] Patient Active Problem List   Diagnosis Date Noted  . Malnutrition of moderate degree 05/07/2020  . Chronic pain 05/06/2020  . Tobacco dependence 05/06/2020  . Hypertension   . Diabetes mellitus type 2 in nonobese (HCC)   . COPD (chronic obstructive pulmonary disease) (HCC) 04/11/2020  . Elevated troponin 04/11/2020  . Acute on chronic respiratory failure with hypoxia (HCC) 04/11/2020  . Elevated transaminase level   . COPD with acute exacerbation (HCC) 06/30/2019  . Acute respiratory failure with hypoxemia (HCC) 06/30/2019  . Hypotension 06/30/2019  . Near syncope 06/30/2019  . Pleural nodule 06/30/2019  . Colon cancer screening 12/18/2017  . History of colonic polyps 12/18/2017  . 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 COPD GOLD III/ actively smoking  10/05/2016   PCP:  Fleet Contras, MD Pharmacy:   [facility] [address] [place] - [address] [place] [address] Phone: [phone] Fax: [phone]  [facility] [address] [place]  [address] Phone: [phone] Fax: [phone]     Social Determinants of Health (SDOH) Interventions    Readmission Risk Interventions Readmission Risk Prevention Plan 05/25/2020  Transportation Screening Complete  Medication Review Oceanographer) Complete  PCP or Specialist appointment  within 3-5 days of discharge Complete  HRI or Home Care Consult Complete  SW Recovery Care/Counseling Consult Complete  Palliative Care Screening Not Applicable  Skilled Nursing Facility Not Applicable  Some recent data might be hidden

## 2020-05-25 NOTE — Evaluation (Signed)
Occupational Therapy Evaluation Patient Details Name: Stephen Baruch MRN: [id number] DOB: [birth year] Today's Date: 05/25/2020    History of Present Illness Patient is a 60 y/o male who presents with SOB on 05/24/20. Admitted with COPD exacerbation. This is his third admission in the last 6 weeks (3/5-3/7, 3/30-4/2 most recently). PMH includes COPD, HTN, recently quit smoking, DM.   Clinical Impression   Pt is Ind with ADLs and ADL mobility. O2 SATs 88-93% throughout activity; all education completed and no further acute OT services are indicated at this time    Follow Up Recommendations  No OT follow up    Equipment Recommendations  None recommended by OT    Recommendations for Other Services       Precautions / Restrictions Precautions Precautions: Other (comment) Precaution Comments: watch 02 Restrictions Weight Bearing Restrictions: No      Mobility Bed Mobility Overal bed mobility: Independent                  Transfers Overall transfer level: Independent Equipment used: None                  Balance                                           ADL either performed or assessed with clinical judgement   ADL Overall ADL's : Independent                                             Vision Patient Visual Report: No change from baseline       Perception     Praxis      Pertinent Vitals/Pain Pain Assessment: Faces Faces Pain Scale: Hurts a little bit Pain Location: back-chronic Pain Descriptors / Indicators: Sore Pain Intervention(s): Monitored during session;Repositioned     Hand Dominance Right   Extremity/Trunk Assessment Upper Extremity Assessment Upper Extremity Assessment: Overall WFL for tasks assessed   Lower Extremity Assessment Lower Extremity Assessment: Defer to PT evaluation   Cervical / Trunk Assessment Cervical / Trunk Assessment: Normal   Communication Communication Communication: No  difficulties   Cognition Arousal/Alertness: Awake/alert Behavior During Therapy: WFL for tasks assessed/performed Overall Cognitive Status: Within Functional Limits for tasks assessed                                     General Comments  O2 SATs 88-93% throughout activity    Exercises     Shoulder Instructions      Home Living Family/patient expects to be discharged to:: Private residence Living Arrangements: Spouse/significant other Available Help at Discharge: Family;Available PRN/intermittently Type of Home: Apartment Home Access: Level entry Entrance Stairs-Number of Steps: 12   Home Layout: One level               Home Equipment: None          Prior Functioning/Environment Level of Independence: Independent        Comments: Owns boat business        OT Problem List: Decreased activity tolerance      OT Treatment/Interventions:      OT Goals(Current goals can be found in the care plan section)  Acute Rehab OT Goals Patient Stated Goal: to get home OT Goal Formulation: With patient  OT Frequency:     Barriers to D/C:            Co-evaluation              AM-PAC OT "6 Clicks" Daily Activity     Outcome Measure Help from another person eating meals?: None Help from another person taking care of personal grooming?: None Help from another person toileting, which includes using toliet, bedpan, or urinal?: None Help from another person bathing (including washing, rinsing, drying)?: None Help from another person to put on and taking off regular upper body clothing?: None Help from another person to put on and taking off regular lower body clothing?: None 6 Click Score: 24   End of Session    Activity Tolerance: Patient tolerated treatment well Patient left: in bed  OT Visit Diagnosis: Other (comment) (dyspnea/SOB)                Time: 5993-5701 OT Time Calculation (min): 19 min Charges:  OT General Charges $OT Visit: 1  Visit OT Evaluation $OT Eval Moderate Complexity: 1 Mod    Galen Manila 05/25/2020, 1:27 PM

## 2020-05-25 NOTE — TOC Transition Note (Signed)
Transition of Care [facility]) - CM/SW Discharge Note   Patient Details  Name: Glenn [name] MRN: [id number] Date of Birth: August 01, 1960  Transition of Care [facility]) CM/SW Contact:  Leone Haven, RN Phone Number: 05/25/2020, 11:52 AM   Clinical Narrative:    Patient is for dc today, he has no needs.   Final next level of care: Home/Self Care Barriers to Discharge: No Barriers Identified   Patient Goals and CMS Choice Patient states their goals for this hospitalization and ongoing recovery are:: return home   Choice offered to / list presented to : NA  Discharge Placement                       Discharge Plan and Services   Discharge Planning Services: CM Consult Post Acute Care Choice: NA            DME Agency: NA       HH Arranged: NA          Social Determinants of Health (SDOH) Interventions     Readmission Risk Interventions Readmission Risk Prevention Plan 05/25/2020  Transportation Screening Complete  Medication Review [facility]) Complete  PCP or Specialist appointment within 3-5 days of discharge Complete  HRI or Home Care Consult Complete  SW Recovery Care/Counseling Consult Complete  Palliative Care Screening Not Applicable  Skilled Nursing Facility Not Applicable  Some recent data might be hidden

## 2020-05-25 NOTE — Plan of Care (Signed)

## 2020-05-25 NOTE — Progress Notes (Addendum)
Name: Glenn [name] MRN: [id number] DOB: 10-03-1960    ADMISSION DATE:  05/23/2020 CONSULTATION DATE: 05/24/2020  REFERRING MD : [facility]  CHIEF COMPLAINT: Wheezing and shortness of breath   HISTORY OF PRESENT ILLNESS:   60 year old male, followed by Dr. Sherene Sires for COPD.  He was recently released from the practice due to noncompliance with smoking.  He has had lung reduction surgery in the past & continues to smoke despite that.  He is disabled due to his pulmonary function.  He has seasonal allergies that tend to exacerbate his underlying COPD which is listed as a gold stage II.  He reports when he is walking he has to use his nebulizer machine if he walks too hard.  He notes that the wheezing makes him stop.  He has had multiple admissions for acute exacerbation of COPD over the last few months. He has a appointment with the allergist on May 25 of this year.  He was admitted for increasing shortness of breath and wheezing he has been on and off steroids multiple times.     His last documented PFT were in December 2021 notable for FEV1 of 1.9 which is 36% of predicted Total lung capacity 3.9 which is 67% of predicted.  DLCO 12.71 / 52%.   SUBJECTIVE:  Pt reports he is ready to go home.  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  Denies SOB, cough & sputum production   VITAL SIGNS: Temp:  [97.5 F (36.4 C)-98.6 F (37 C)] 98.2 F (36.8 C) (04/18 0747) Pulse Rate:  [97-106] 102 (04/18 0747) Resp:  [16-22] 20 (04/18 0747) BP: (115-158)/(70-101) 138/101 (04/18 0747) SpO2:  [97 %-100 %] 98 % (04/18 0747) Weight:  [64.7 kg] 64.7 kg (04/18 0502)  PHYSICAL EXAMINATION: General: adult male lying in bed in NAD   HEENT: MM pink/moist, anicteric Neuro: AAOx4, speech clear, MAE CV: s1s2 RRR, no m/r/g PULM: non-labored on RA, clear bilaterally  GI: soft, bsx4 active  Extremities: warm/dry, no edema  Skin: no rashes or lesions   Recent Labs  Lab 05/23/20 2324  05/24/20 0342 05/25/20 0335  NA 136 136 134*  K 3.4* 3.3* 4.4  CL 101 101 100  CO2 27 26 28   BUN 17 14 19   CREATININE 0.75 0.83 0.91  GLUCOSE 118* 203* 210*   Recent Labs  Lab 05/23/20 2324 05/24/20 0342 05/25/20 0335  HGB 13.1 12.9* 12.0*  HCT 40.7 39.6 37.5*  WBC 6.4 6.9 9.9  PLT 228 234 225   DG Chest Portable 1 View  Result Date: 05/23/2020 CLINICAL DATA:  Shortness of breath EXAM: PORTABLE CHEST 1 VIEW COMPARISON:  05/06/2020 FINDINGS: Mild elevation of the left hemidiaphragm. Scarring in the lungs bilaterally. No definite acute confluent opacity or effusion. Heart is normal size. IMPRESSION: Stable chronic changes.  No active disease. Electronically Signed   By: 05/25/2020 M.D.   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 Charlett Nose  -follow up in pulmonary office, likely will need to establish with new MD.  -follow up appt 5/2 with office NP at 10:30 am  -will need outpatient PFT's, 6 min walk and sleep study  -pulmonary hygiene - IS, mobilize  -complete 5 days azithromycin  -ok to  return to Symbicort + Spiriva at discharge (ensure he has active prescription) with PRN albuterol    Anticipate d/c 4/18, PCCM will sign off. Please call back with new concerns.     Canary Brim, MSN, APRN, NP-C, AGACNP-BC [facility] 05/25/2020, 8:05 AM   Please see [email or web address] for pager details.   From 7A-7P if no response, please call [phone] After hours, please call ELink [phone]

## 2020-05-25 NOTE — Progress Notes (Signed)
D/C instructions given and reviewed. Tele and IV removed, tolerated well. Awaiting TOC meds. 

## 2020-05-25 NOTE — Discharge Summary (Signed)
Physician Discharge Summary  Isiaih Hollenbach HQP:[id number] DOB: [birth year] DOA: 05/23/2020  PCP: Fleet Contras, MD  Admit date: 05/23/2020 Discharge date: 05/25/2020  Admitted From: Home Disposition: Home  Recommendations for Outpatient Follow-up:  1. Follow up with PCP in 1-2 weeks 2. Please obtain BMP/CBC in one week your next doctors visit.  3. Seen by pulmonary, has outpatient follow-up 4. Continue Symbicort and Spiriva with as needed albuterol 5. 3 more days of oral azithromycin to complete 5-day course 6. Will need outpatient PFTs, 6-minute walk and sleep study 7. 12-day prednisone taper.  Prednisone 40 mg daily for 3 days followed by 30 mg daily for 3 days, followed by 20 mg daily for 3 days, followed by 10 mg daily for 3 days 8. Singulair at bedtime   Discharge Condition: Stable CODE STATUS: Full code Diet recommendation: 2 g salt  Brief/Interim Summary: 60 year old with history of HTN, DM2, COPD, asthma, lung wedge resection 2011 claims to have quit smoking 3 weeks ago admitted for shortness of breath with concerns for COPD exacerbation.  This is his third admission in the last 6 weeks.  Pulmonary team consulted.  Initially patient was started on IV steroids and bronchodilator which was transitioned to p.o. prednisone.  He was weaned off oxygen and symptomatically felt back to his baseline.  Patient was discharged on prolonged prednisone taper as mentioned above.  Rest of the recommendations as stated above. Today he feels back to his baseline.  All the questions answered by me, has reached maximum benefit from hospital stay and stable for discharge.   Assessment & Plan:   Principal Problem:   COPD with acute exacerbation (HCC) Active Problems:   COPD GOLD III/ actively smoking    Acute respiratory failure with hypoxemia (HCC)   Hypertension   Diabetes mellitus type 2 in nonobese (HCC)   Chronic pain  Acute COPD exacerbation, improved -Chest x-ray is negative with  chronic changes -Transition to 12-day prednisone taper as mentioned above -Scheduled and as needed bronchodilators -Echocardiogram shows EF 55-60%, grade 1 diastolic dysfunction -Seen by pulmonary.  Will discharge on 3 more days of oral azithromycin to complete 5-day course.  Symbicort plus Spiriva, albuterol as needed.  Essential hypertension -Norvasc, HCTZ  Chronic pain -As needed oxycodone, metaxalone  Diabetes mellitus type 2 Peripheral neuropathy secondary to DM2 -Resume home metformin.  Continue gabapentin.  Iron deficiency anemia/B12 deficiency -Iron supplements with bowel regimen  BPH -Flomax  GERD -PPI   Body mass index is 23.73 kg/m.         Discharge Diagnoses:  Principal Problem:   COPD with acute exacerbation (HCC) Active Problems:   COPD GOLD III/ actively smoking    Acute respiratory failure with hypoxemia (HCC)   Hypertension   Diabetes mellitus type 2 in nonobese [facility])   Chronic pain      Consultations:  Pulmonary  Subjective: Feels okay no complaints.  Feels back to his baseline.  Discharge Exam: Vitals:   05/25/20 0747 05/25/20 0833  BP: (!) 138/101   Pulse: (!) 102   Resp: 20   Temp: 98.2 F (36.8 C)   SpO2: 98% 94%   Vitals:   05/24/20 2135 05/25/20 0502 05/25/20 0747 05/25/20 0833  BP: (!) 131/94 115/70 (!) 138/101   Pulse: (!) 106 97 (!) 102   Resp: 19 20 20    Temp: 97.9 F (36.6 C) 98.6 F (37 C) 98.2 F (36.8 C)   TempSrc: Oral Oral Oral   SpO2: 98% 98% 98% 94%  Weight:  64.7 kg    Height:        General: Pt is alert, awake, not in acute distress Cardiovascular: RRR, S1/S2 +, no rubs, no gallops Respiratory: CTA bilaterally, no wheezing, no rhonchi Abdominal: Soft, NT, ND, bowel sounds + Extremities: no edema, no cyanosis  Discharge Instructions   Allergies as of 05/25/2020      Reactions   Insulins Swelling   Ibuprofen    Abdominal sensitivity   Shrimp [shellfish Allergy] Itching   Tylenol  [acetaminophen]    Abdominal sensitvity   Lisinopril Rash   Tongue swelling      Medication List    TAKE these medications   albuterol (2.5 MG/3ML) 0.083% nebulizer solution Commonly known as: PROVENTIL PLEASE SEE ATTACHED FOR DETAILED DIRECTIONS What changed: See the new instructions.   albuterol 108 (90 Base) MCG/ACT inhaler Commonly known as: Proventil HFA Inhale 2 puffs into the lungs every 4 (four) hours as needed for wheezing or shortness of breath. What changed: Another medication with the same name was changed. Make sure you understand how and when to take each.   Amitiza 24 MCG capsule Generic drug: lubiprostone Take 24 mcg by mouth at bedtime.   amLODipine 10 MG tablet Commonly known as: NORVASC Take 10 mg by mouth daily.   Aspirin Low Dose 81 MG EC tablet Generic drug: aspirin Take 81 mg by mouth daily.   azithromycin 250 MG tablet Commonly known as: ZITHROMAX Take 1 tablet (250 mg total) by mouth daily for 3 days.   benzonatate 100 MG capsule Commonly known as: TESSALON Take 200 mg by mouth every 8 (eight) hours as needed for cough.   budesonide-formoterol 160-4.5 MCG/ACT inhaler Commonly known as: Symbicort Inhale 2 puffs into the lungs 2 (two) times daily.   cetirizine 10 MG tablet Commonly known as: ZyrTEC Allergy Take 1 tablet (10 mg total) by mouth 2 (two) times daily as needed for allergies (swelling). What changed: when to take this   cyanocobalamin 1000 MCG tablet Take 1 tablet (1,000 mcg total) by mouth daily.   ferrous sulfate 325 (65 FE) MG tablet Take 1 tablet (325 mg total) by mouth daily with breakfast.   gabapentin 300 MG capsule Commonly known as: Neurontin Take 1 capsule (300 mg total) by mouth 3 (three) times daily.   glipiZIDE 5 MG tablet Commonly known as: GLUCOTROL Take 0.5 tablets (2.5 mg total) by mouth 2 (two) times daily for 20 days.   hydrochlorothiazide 12.5 MG tablet Commonly known as: HYDRODIURIL Take 12.5 mg by  mouth daily.   hydrOXYzine 25 MG tablet Commonly known as: ATARAX/VISTARIL Take 25 mg by mouth 2 (two) times daily as needed for anxiety.   metaxalone 800 MG tablet Commonly known as: SKELAXIN Take 1 tablet (800 mg total) by mouth 3 (three) times daily. What changed:   when to take this  reasons to take this   metFORMIN 500 MG tablet Commonly known as: GLUCOPHAGE Take 500 mg by mouth every morning.   montelukast 10 MG tablet Commonly known as: SINGULAIR Take 1 tablet (10 mg total) by mouth at bedtime.   multivitamin with minerals Tabs tablet Take 1 tablet by mouth daily.   nicotine 14 mg/24hr patch Commonly known as: Nicoderm CQ Place 1 patch (14 mg total) onto the skin daily.   nitroGLYCERIN 0.4 MG SL tablet Commonly known as: NITROSTAT Place 0.4 mg under the tongue every 5 (five) minutes as needed for chest pain.   oxyCODONE 15 MG immediate release tablet Commonly  known as: ROXICODONE Take 15 mg by mouth every 6 (six) hours as needed for pain.   pantoprazole 40 MG tablet Commonly known as: PROTONIX Take 1 tablet (40 mg total) by mouth daily.   predniSONE 20 MG tablet Commonly known as: DELTASONE Take 2 tablets (40 mg total) by mouth daily with breakfast for 3 days, THEN 1.5 tablets (30 mg total) daily with breakfast for 3 days, THEN 1 tablet (20 mg total) daily with breakfast for 3 days, THEN 0.5 tablets (10 mg total) daily with breakfast for 3 days. Start taking on: May 26, 2020   sildenafil 100 MG tablet Commonly known as: VIAGRA Take 1 tablet (100 mg total) by mouth daily as needed. What changed: reasons to take this   Spiriva Respimat 2.5 MCG/ACT Aers Generic drug: Tiotropium Bromide Monohydrate 2 puffs each am   tamsulosin 0.4 MG Caps capsule Commonly known as: FLOMAX Take 0.4 mg by mouth every evening.   Vitamin D (Ergocalciferol) 1.25 MG (50000 UNIT) Caps capsule Commonly known as: DRISDOL Take 50,000 Units by mouth once a week. Mondays        Follow-up Information    Parrett, Virgel Bouquet, NP Follow up on 06/08/2020.   Specialty: Pulmonary Disease Why: Appt at 10:30. Please arrive at 10:15 for check in.  Contact information: [address] [place] [address] [phone]        Fleet Contras, MD .   Specialty: Internal Medicine Contact information: [address] [place] [address] [phone]              Allergies  Allergen Reactions  . Insulins Swelling  . Ibuprofen     Abdominal sensitivity  . Shrimp [Shellfish Allergy] Itching  . Tylenol [Acetaminophen]     Abdominal sensitvity  . Lisinopril Rash    Tongue swelling    You were cared for by a hospitalist during your hospital stay. If you have any questions about your discharge medications or the care you received while you were in the hospital after you are discharged, you can call the unit and asked to speak with the hospitalist on call if the hospitalist that took care of you is not available. Once you are discharged, your primary care physician will handle any further medical issues. Please note that no refills for any discharge medications will be authorized once you are discharged, as it is imperative that you return to your primary care physician (or establish a relationship with a primary care physician if you do not have one) for your aftercare needs so that they can reassess your need for medications and monitor your lab values.   Procedures/Studies: DG Chest Portable 1 View  Result Date: 05/23/2020 CLINICAL DATA:  Shortness of breath EXAM: PORTABLE CHEST 1 VIEW COMPARISON:  05/06/2020 FINDINGS: Mild elevation of the left hemidiaphragm. Scarring in the lungs bilaterally. No definite acute confluent opacity or effusion. Heart is normal size. IMPRESSION: Stable chronic changes.  No active disease. Electronically Signed   By: Charlett Nose M.D.   On: 05/23/2020 23:08   DG Chest Port 1 View  Result Date: 05/06/2020 CLINICAL DATA:   Shortness of breath. EXAM: PORTABLE CHEST 1 VIEW COMPARISON:  Chest x-ray 04/11/2020, 11/20/2018. CT 04/13/2020 and 06/30/2019. FINDINGS: Bilateral postsurgical changes and noted. Mediastinum is normal. Heart size normal. Chronic bilateral perihilar changes of bronchiectasis and scarring. Bilateral pleural-parenchymal thickening consistent scarring again noted. No pleural effusion or pneumothorax. Stable elevation left hemidiaphragm. No acute bony abnormality identified. IMPRESSION: Bilateral  postsurgical changes again noted. Chronic bilateral perihilar changes of bronchiectasis and scarring. Bilateral pleural-parenchymal thickening consistent with scarring again noted. Electronically Signed   By: Maisie Fus  Register   On: 05/06/2020 09:27      The results of significant diagnostics from this hospitalization (including imaging, microbiology, ancillary and laboratory) are listed below for reference.     Microbiology: Recent Results (from the past 240 hour(s))  Resp Panel by RT-PCR (Flu A&B, Covid) Nasopharyngeal Swab     Status: None   Collection Time: 05/23/20  1:10 AM   Specimen: Nasopharyngeal Swab; Nasopharyngeal(NP) swabs in vial transport medium  Result Value Ref Range Status   SARS Coronavirus 2 by RT PCR NEGATIVE NEGATIVE Final    Comment: (NOTE) SARS-CoV-2 target nucleic acids are NOT DETECTED.  The SARS-CoV-2 RNA is generally detectable in upper respiratory specimens during the acute phase of infection. The lowest concentration of SARS-CoV-2 viral copies this assay can detect is 138 copies/mL. A negative result does not preclude SARS-Cov-2 infection and should not be used as the sole basis for treatment or other patient management decisions. A negative result may occur with  improper specimen collection/handling, submission of specimen other than nasopharyngeal swab, presence of viral mutation(s) within the areas targeted by this assay, and inadequate number of viral copies(<138  copies/mL). A negative result must be combined with clinical observations, patient history, and epidemiological information. The expected result is Negative.  Fact Sheet for Patients:  BloggerCourse.com  Fact Sheet for Healthcare Providers:  SeriousBroker.it  This test is no t yet approved or cleared by the [place] FDA and  has been authorized for detection and/or diagnosis of SARS-CoV-2 by FDA under an Emergency Use Authorization (EUA). This EUA will remain  in effect (meaning this test can be used) for the duration of the COVID-19 declaration under Section 564(b)(1) of the Act, 21 U.S.C.section 360bbb-3(b)(1), unless the authorization is terminated  or revoked sooner.       Influenza A by PCR NEGATIVE NEGATIVE Final   Influenza B by PCR NEGATIVE NEGATIVE Final    Comment: (NOTE) The Xpert Xpress SARS-CoV-2/FLU/RSV plus assay is intended as an aid in the diagnosis of influenza from Nasopharyngeal swab specimens and should not be used as a sole basis for treatment. Nasal washings and aspirates are unacceptable for Xpert Xpress SARS-CoV-2/FLU/RSV testing.  Fact Sheet for Patients: BloggerCourse.com  Fact Sheet for Healthcare Providers: SeriousBroker.it  This test is not yet approved or cleared by the [place] FDA and has been authorized for detection and/or diagnosis of SARS-CoV-2 by FDA under an Emergency Use Authorization (EUA). This EUA will remain in effect (meaning this test can be used) for the duration of the COVID-19 declaration under Section 564(b)(1) of the Act, 21 U.S.C. section 360bbb-3(b)(1), unless the authorization is terminated or revoked.  Performed at [facility], [address], [place], [place] [address]      Labs: BNP (last 3 results) Recent Labs    06/29/19 2245 05/25/20 0335  BNP 19.3 27.5   Basic Metabolic Panel: Recent  Labs  Lab 05/23/20 2324 05/24/20 0342 05/25/20 0335  NA 136 136 134*  K 3.4* 3.3* 4.4  CL 101 101 100  CO2 27 26 28   GLUCOSE 118* 203* 210*  BUN 17 14 19   CREATININE 0.75 0.83 0.91  CALCIUM 8.7* 8.8* 9.5  MG  --   --  2.1   Liver Function Tests: No results for input(s): AST, ALT, ALKPHOS, BILITOT, PROT, ALBUMIN in the last  168 hours. No results for input(s): LIPASE, AMYLASE in the last 168 hours. No results for input(s): AMMONIA in the last 168 hours. CBC: Recent Labs  Lab 05/23/20 2324 05/24/20 0342 05/25/20 0335  WBC 6.4 6.9 9.9  NEUTROABS 3.6  --   --   HGB 13.1 12.9* 12.0*  HCT 40.7 39.6 37.5*  MCV 89.1 89.4 88.9  PLT 228 234 225   Cardiac Enzymes: No results for input(s): CKTOTAL, CKMB, CKMBINDEX, TROPONINI in the last 168 hours. BNP: Invalid input(s): POCBNP CBG: Recent Labs  Lab 05/24/20 0559  GLUCAP 229*   D-Dimer No results for input(s): DDIMER in the last 72 hours. Hgb A1c No results for input(s): HGBA1C in the last 72 hours. Lipid Profile No results for input(s): CHOL, HDL, LDLCALC, TRIG, CHOLHDL, LDLDIRECT in the last 72 hours. Thyroid function studies No results for input(s): TSH, T4TOTAL, T3FREE, THYROIDAB in the last 72 hours.  Invalid input(s): FREET3 Anemia work up No results for input(s): VITAMINB12, FOLATE, FERRITIN, TIBC, IRON, RETICCTPCT in the last 72 hours. Urinalysis No results found for: COLORURINE, APPEARANCEUR, LABSPEC, PHURINE, GLUCOSEU, HGBUR, BILIRUBINUR, KETONESUR, PROTEINUR, UROBILINOGEN, NITRITE, LEUKOCYTESUR Sepsis Labs Invalid input(s): PROCALCITONIN,  WBC,  LACTICIDVEN Microbiology Recent Results (from the past 240 hour(s))  Resp Panel by RT-PCR (Flu A&B, Covid) Nasopharyngeal Swab     Status: None   Collection Time: 05/23/20  1:10 AM   Specimen: Nasopharyngeal Swab; Nasopharyngeal(NP) swabs in vial transport medium  Result Value Ref Range Status   SARS Coronavirus 2 by RT PCR NEGATIVE NEGATIVE Final    Comment:  (NOTE) SARS-CoV-2 target nucleic acids are NOT DETECTED.  The SARS-CoV-2 RNA is generally detectable in upper respiratory specimens during the acute phase of infection. The lowest concentration of SARS-CoV-2 viral copies this assay can detect is 138 copies/mL. A negative result does not preclude SARS-Cov-2 infection and should not be used as the sole basis for treatment or other patient management decisions. A negative result may occur with  improper specimen collection/handling, submission of specimen other than nasopharyngeal swab, presence of viral mutation(s) within the areas targeted by this assay, and inadequate number of viral copies(<138 copies/mL). A negative result must be combined with clinical observations, patient history, and epidemiological information. The expected result is Negative.  Fact Sheet for Patients:  BloggerCourse.com  Fact Sheet for Healthcare Providers:  SeriousBroker.it  This test is no t yet approved or cleared by the [place] FDA and  has been authorized for detection and/or diagnosis of SARS-CoV-2 by FDA under an Emergency Use Authorization (EUA). This EUA will remain  in effect (meaning this test can be used) for the duration of the COVID-19 declaration under Section 564(b)(1) of the Act, 21 U.S.C.section 360bbb-3(b)(1), unless the authorization is terminated  or revoked sooner.       Influenza A by PCR NEGATIVE NEGATIVE Final   Influenza B by PCR NEGATIVE NEGATIVE Final    Comment: (NOTE) The Xpert Xpress SARS-CoV-2/FLU/RSV plus assay is intended as an aid in the diagnosis of influenza from Nasopharyngeal swab specimens and should not be used as a sole basis for treatment. Nasal washings and aspirates are unacceptable for Xpert Xpress SARS-CoV-2/FLU/RSV testing.  Fact Sheet for Patients: BloggerCourse.com  Fact Sheet for Healthcare  Providers: SeriousBroker.it  This test is not yet approved or cleared by the [place] FDA and has been authorized for detection and/or diagnosis of SARS-CoV-2 by FDA under an Emergency Use Authorization (EUA). This EUA will remain in effect (meaning this test can be  used) for the duration of the COVID-19 declaration under Section 564(b)(1) of the Act, 21 U.S.C. section 360bbb-3(b)(1), unless the authorization is terminated or revoked.  Performed at [facility], [address] [place], [place] [address]      Time coordinating discharge:  I have spent 35 minutes face to face with the patient and on the ward discussing the patients care, assessment, plan and disposition with other care givers. >50% of the time was devoted counseling the patient about the risks and benefits of treatment/Discharge disposition and coordinating care.   SIGNED:   Dimple Nanas, MD  [facility] 05/25/2020, 10:39 AM   If 7PM-7AM, please contact night-coverage

## 2020-06-04 ENCOUNTER — Other Ambulatory Visit (HOSPITAL_COMMUNITY): Payer: Self-pay

## 2020-06-08 ENCOUNTER — Inpatient Hospital Stay: Payer: Medicaid Other | Admitting: Adult Health

## 2020-06-09 ENCOUNTER — Ambulatory Visit (INDEPENDENT_AMBULATORY_CARE_PROVIDER_SITE_OTHER): Payer: Medicaid Other | Admitting: Adult Health

## 2020-06-09 ENCOUNTER — Encounter: Payer: Self-pay | Admitting: Adult Health

## 2020-06-09 ENCOUNTER — Other Ambulatory Visit: Payer: Self-pay

## 2020-06-09 VITALS — BP 160/80 | HR 112 | Temp 98.3°F | Ht 65.0 in | Wt 145.4 lb

## 2020-06-09 DIAGNOSIS — J449 Chronic obstructive pulmonary disease, unspecified: Secondary | ICD-10-CM

## 2020-06-09 DIAGNOSIS — F17200 Nicotine dependence, unspecified, uncomplicated: Secondary | ICD-10-CM

## 2020-06-09 DIAGNOSIS — R400 Somnolence: Secondary | ICD-10-CM | POA: Diagnosis not present

## 2020-06-09 MED ORDER — CETIRIZINE HCL 10 MG PO TABS: 10.0000 mg | ORAL_TABLET | Freq: Every day | ORAL | 6 refills | Status: DC

## 2020-06-09 MED ORDER — BENZONATATE 100 MG PO CAPS: 200.0000 mg | ORAL_CAPSULE | Freq: Three times a day (TID) | ORAL | 0 refills | Status: DC | PRN

## 2020-06-09 MED ORDER — NICOTINE 14 MG/24HR TD PT24: 14.0000 mg | MEDICATED_PATCH | Freq: Every day | TRANSDERMAL | 6 refills | Status: DC

## 2020-06-09 MED ORDER — BUDESONIDE-FORMOTEROL FUMARATE 160-4.5 MCG/ACT IN AERO: 2.0000 | INHALATION_SPRAY | Freq: Two times a day (BID) | RESPIRATORY_TRACT | 11 refills | Status: DC

## 2020-06-09 MED ORDER — MONTELUKAST SODIUM 10 MG PO TABS: 10.0000 mg | ORAL_TABLET | Freq: Every day | ORAL | 6 refills | Status: DC

## 2020-06-09 NOTE — Assessment & Plan Note (Signed)
Daytime sleepiness, restless sleep-symptoms concerning for possible underlying sleep apnea. Patient will be set up for a home sleep study.

## 2020-06-09 NOTE — Patient Instructions (Addendum)
Continue on Symbicort  2 puffs Twice daily  , rinse after use.  Continue on Spiriva 2 puff daily  Albuterol inhaler As needed   Nicotine patches as needed .  Set up for home sleep study .  Follow up with Dr. Sherene Sires  In 2 months with PFT and As needed   Please contact office for sooner follow up if symptoms do not improve or worsen or seek emergency care

## 2020-06-09 NOTE — Progress Notes (Signed)
@Patient  ID: , male    DOB: 06-Dec-1960, 60 y.o.   MRN: [id number]  Chief Complaint  Patient presents with  . Hospitalization Follow-up    Referring provider: [id number], MD  HPI: 60 year old male active smoker followed for severe COPD with asthma.  Medical history significant for diabetes and hypertension.  Lung resection in 2011 Chronic pain on daily narcotics   TEST/EVENTS :  Spirometry 10/05/2016  FEV1 1.36 (52%)  Ratio 65 p saba with classic curvature  - Allergy profile 11/22/2017 >  Eos 0.6 /  IgE  25 RAST neg x for roaches/ neg for mold   - PFT's  02/08/2018  FEV1 0.90 (36 % ) ratio 59   p multiple saba prior to study with DLCO  52 % corrects to 106  % for alv volume    2D echo April 13, 2020 EF 55 to 60%, grade 1 diastolic dysfunction, normal pulmonary artery systolic pressure  CT chest April 13, 2020 negative for pulmonary embolism.  Pulmonary emphysema and scarring is noted.  Areas of previous pulmonary resection noted.  Areas of bronchiectasis in both lungs.  06/09/2020 Follow up ; COPD  Patient returns for a follow-up visit.  Patient is prone to frequent hospitalizations for COPD. Over the last 6 months patient has been in the emergency room or hospitalized greater than 8 times.  Most recent admission was 2 weeks ago for COPD exacerbation.  He was treated with antibiotics and steroids.  There was also concern of possible underlying sleep apnea and has been recommended for a sleep study in the outpatient setting.  He is currently on Symbicort and Spiriva . Need refill Symbicort .  Spiriva was verified that is being picked up at pharmacy .  Does feel better since discharge , decrease cough and dyspnea.   Denies snoring . Has daytime sleepiness. Feels tired a lot and not rested.  Goes to bed at 930 pm  And get up around 6 am. Gets up 3-4 times a night .   2 cups of caffeine daily  No symptoms suspicious for cataplexy or narcolepsy.   No smoking x 1 month,  cessation encouraged.  Wants nicotine patch sent to pharmacy. Has been using for last month and really helping him.   Allergies  Allergen Reactions  . Insulins Swelling  . Ibuprofen     Abdominal sensitivity  . Shrimp [Shellfish Allergy] Itching  . Tylenol [Acetaminophen]     Abdominal sensitvity  . Lisinopril Rash    Tongue swelling     There is no immunization history on file for this patient.  Past Medical History:  Diagnosis Date  . Arthritis   . Asthma   . Chronic pain 05/06/2020  . COPD (chronic obstructive pulmonary disease) (HCC)   . Diabetes mellitus type 2 in nonobese (HCC)   . ED (erectile dysfunction)   . Hypertension   . Tobacco dependence 05/06/2020    Tobacco History: Social History   Tobacco Use  Smoking Status Former Smoker  . Packs/day: 0.50  . Years: 34.00  . Pack years: 17.00  . Types: Cigarettes  . Quit date: 05/11/2020  . Years since quitting: 0.0  Smokeless Tobacco Never Used  Tobacco Comment   at the most smoked 6-7 cigarettes/day   Counseling given: Not Answered Comment: at the most smoked 6-7 cigarettes/day   Outpatient Medications Prior to Visit  Medication Sig Dispense Refill  . albuterol (PROVENTIL HFA) 108 (90 Base) MCG/ACT inhaler Inhale 2 puffs into  the lungs every 4 (four) hours as needed for wheezing or shortness of breath. 1 g 11  . albuterol (PROVENTIL) (2.5 MG/3ML) 0.083% nebulizer solution PLEASE SEE ATTACHED FOR DETAILED DIRECTIONS (Patient taking differently: Take 2.5 mg by nebulization every 6 (six) hours as needed for wheezing or shortness of breath.) 150 mL 2  . AMITIZA 24 MCG capsule Take 24 mcg by mouth at bedtime.    Marland Kitchen amLODipine (NORVASC) 10 MG tablet Take 10 mg by mouth daily.    . ASPIRIN LOW DOSE 81 MG EC tablet Take 81 mg by mouth daily.    . ferrous sulfate 325 (65 FE) MG tablet Take 1 tablet (325 mg total) by mouth daily with breakfast. 30 tablet 0  . gabapentin (NEURONTIN) 300 MG capsule Take 1 capsule (300 mg  total) by mouth 3 (three) times daily. 50 capsule 0  . hydrochlorothiazide (HYDRODIURIL) 12.5 MG tablet Take 12.5 mg by mouth daily.    . hydrOXYzine (ATARAX/VISTARIL) 25 MG tablet Take 25 mg by mouth 2 (two) times daily as needed for anxiety.  unk  . metaxalone (SKELAXIN) 800 MG tablet Take 1 tablet (800 mg total) by mouth 3 (three) times daily. (Patient taking differently: Take 800 mg by mouth 3 (three) times daily as needed for muscle spasms.) 21 tablet 0  . metFORMIN (GLUCOPHAGE) 500 MG tablet Take 500 mg by mouth every morning.    . Multiple Vitamin (MULTIVITAMIN WITH MINERALS) TABS tablet Take 1 tablet by mouth daily. 30 tablet 0  . nitroGLYCERIN (NITROSTAT) 0.4 MG SL tablet Place 0.4 mg under the tongue every 5 (five) minutes as needed for chest pain.    Marland Kitchen oxyCODONE (ROXICODONE) 15 MG immediate release tablet Take 15 mg by mouth every 6 (six) hours as needed for pain.    . pantoprazole (PROTONIX) 40 MG tablet Take 1 tablet (40 mg total) by mouth daily. 30 tablet 0  . sildenafil (VIAGRA) 100 MG tablet Take 1 tablet (100 mg total) by mouth daily as needed. (Patient taking differently: Take 100 mg by mouth daily as needed for erectile dysfunction.) 30 tablet 5  . tamsulosin (FLOMAX) 0.4 MG CAPS capsule Take 0.4 mg by mouth every evening.    . Tiotropium Bromide Monohydrate (SPIRIVA RESPIMAT) 2.5 MCG/ACT AERS 2 puffs each am 1 each 11  . vitamin B-12 1000 MCG tablet Take 1 tablet (1,000 mcg total) by mouth daily. 30 tablet 0  . Vitamin D, Ergocalciferol, (DRISDOL) 1.25 MG (50000 UNIT) CAPS capsule Take 50,000 Units by mouth once a week. Mondays    . budesonide-formoterol (SYMBICORT) 160-4.5 MCG/ACT inhaler Inhale 2 puffs into the lungs 2 (two) times daily. 1 Inhaler 11  . cetirizine (ZYRTEC ALLERGY) 10 MG tablet Take 1 tablet (10 mg total) by mouth 2 (two) times daily as needed for allergies (swelling). (Patient taking differently: Take 10 mg by mouth at bedtime.) 10 tablet 0  . montelukast  (SINGULAIR) 10 MG tablet Take 1 tablet (10 mg total) by mouth at bedtime. 30 tablet 0  . nicotine (NICODERM CQ) 14 mg/24hr patch Place 1 patch (14 mg total) onto the skin daily. 28 patch 0  . glipiZIDE (GLUCOTROL) 5 MG tablet Take 0.5 tablets (2.5 mg total) by mouth 2 (two) times daily for 20 days. (Patient not taking: Reported on 05/23/2020) 20 tablet 0  . benzonatate (TESSALON) 100 MG capsule Take 200 mg by mouth every 8 (eight) hours as needed for cough. (Patient not taking: Reported on 06/09/2020)     No facility-administered  medications prior to visit.     Review of Systems:   Constitutional:   No  weight loss, night sweats,  Fevers, chills,  +fatigue, or  lassitude.  HEENT:   No headaches,  Difficulty swallowing,  Tooth/dental problems, or  Sore throat,                No sneezing, itching, ear ache, nasal congestion, post nasal drip,   CV:  No chest pain,  Orthopnea, PND, swelling in lower extremities, anasarca, dizziness, palpitations, syncope.   GI  No heartburn, indigestion, abdominal pain, nausea, vomiting, diarrhea, change in bowel habits, loss of appetite, bloody stools.   Resp:  .  No chest wall deformity  Skin: no rash or lesions.  GU: no dysuria, change in color of urine, no urgency or frequency.  No flank pain, no hematuria   MS:  No joint pain or swelling.  No decreased range of motion.  No back pain.    Physical Exam  BP (!) 160/80 (BP Location: Left Arm, Patient Position: Sitting, Cuff Size: Normal)   Pulse (!) 112   Temp 98.3 F (36.8 C)   Ht 5\' 5"  (1.651 m)   Wt 145 lb 6.4 oz (66 kg)   SpO2 96%   BMI 24.20 kg/m   GEN: A/Ox3; pleasant , NAD, well nourished    HEENT:  [place]/AT,    NOSE-clear, THROAT-clear, no lesions, no postnasal drip or exudate noted.   NECK:  Supple w/ fair ROM; no JVD; normal carotid impulses w/o bruits; no thyromegaly or nodules palpated; no lymphadenopathy.    RESP  Clear  P & A; w/o, wheezes/ rales/ or rhonchi. no accessory muscle  use, no dullness to percussion  CARD:  RRR, no m/r/g, no peripheral edema, pulses intact, no cyanosis or clubbing.  GI:   Soft & nt; nml bowel sounds; no organomegaly or masses detected.   Musco: Warm bil, no deformities or joint swelling noted.   Neuro: alert, no focal deficits noted.    Skin: Warm, no lesions or rashes    Lab Results:  CBC    Component Value Date/Time   WBC 9.9 05/25/2020 0335   RBC 4.22 05/25/2020 0335   HGB 12.0 (L) 05/25/2020 0335   HCT 37.5 (L) 05/25/2020 0335   PLT 225 05/25/2020 0335   MCV 88.9 05/25/2020 0335   MCH 28.4 05/25/2020 0335   MCHC 32.0 05/25/2020 0335   RDW 14.8 05/25/2020 0335   LYMPHSABS 1.6 05/23/2020 2324   MONOABS 0.7 05/23/2020 2324   EOSABS 0.4 05/23/2020 2324   BASOSABS 0.1 05/23/2020 2324    BMET    Component Value Date/Time   NA 134 (L) 05/25/2020 0335   K 4.4 05/25/2020 0335   CL 100 05/25/2020 0335   CO2 28 05/25/2020 0335   GLUCOSE 210 (H) 05/25/2020 0335   BUN 19 05/25/2020 0335   CREATININE 0.91 05/25/2020 0335   CALCIUM 9.5 05/25/2020 0335   GFRNONAA >60 05/25/2020 0335   GFRAA >60 07/01/2019 0625    BNP    Component Value Date/Time   BNP 27.5 05/25/2020 0335    ProBNP No results found for: PROBNP  Imaging: DG Chest Portable 1 View  Result Date: 05/23/2020 CLINICAL DATA:  Shortness of breath EXAM: PORTABLE CHEST 1 VIEW COMPARISON:  05/06/2020 FINDINGS: Mild elevation of the left hemidiaphragm. Scarring in the lungs bilaterally. No definite acute confluent opacity or effusion. Heart is normal size. IMPRESSION: Stable chronic changes.  No active disease. Electronically Signed  By: Charlett NoseKevin  Dover M.D.   On: 05/23/2020 23:08      PFT Results Latest Ref Rng & Units 02/08/2018  FVC-Pre L 1.53  FVC-Predicted Pre % 48  Pre FEV1/FVC % % 59  FEV1-Pre L 0.90  FEV1-Predicted Pre % 36  DLCO uncorrected ml/min/mmHg 12.71  DLCO UNC% % 52  DLVA Predicted % 106  TLC L 3.90  TLC % Predicted % 67  RV %  Predicted % 115    No results found for: NITRICOXIDE      Assessment & Plan:   COPD GOLD III/ actively smoking  Frequent COPD exacerbations.  Most recent exacerbation seems to be improving.  Smoking cessation is key.  We will continue on current maintenance regimen.  Check PFTs on return.  Consider adding Daliresp at next visit.   Plan  Patient Instructions  Continue on Symbicort  2 puffs Twice daily  , rinse after use.  Continue on Spiriva 2 puff daily  Albuterol inhaler As needed   Nicotine patches as needed .  Set up for home sleep study .  Follow up with Dr. Sherene SiresWert  In 2 months with PFT and As needed   Please contact office for sooner follow up if symptoms do not improve or worsen or seek emergency care       Tobacco dependence Smoking cessation encouraged.  Nicotine patch refilled.  Daytime sleepiness Daytime sleepiness, restless sleep-symptoms concerning for possible underlying sleep apnea. Patient will be set up for a home sleep study.      Rubye Oaksammy Delilah Mulgrew, NP 06/09/2020

## 2020-06-09 NOTE — Assessment & Plan Note (Signed)
Frequent COPD exacerbations.  Most recent exacerbation seems to be improving.  Smoking cessation is key.  We will continue on current maintenance regimen.  Check PFTs on return.  Consider adding Daliresp at next visit.   Plan  Patient Instructions  Continue on Symbicort  2 puffs Twice daily  , rinse after use.  Continue on Spiriva 2 puff daily  Albuterol inhaler As needed   Nicotine patches as needed .  Set up for home sleep study .  Follow up with Dr. Sherene Sires  In 2 months with PFT and As needed   Please contact office for sooner follow up if symptoms do not improve or worsen or seek emergency care

## 2020-06-09 NOTE — Assessment & Plan Note (Signed)
Smoking cessation encouraged.  Nicotine patch refilled.

## 2020-06-16 ENCOUNTER — Other Ambulatory Visit (HOSPITAL_COMMUNITY): Payer: Self-pay

## 2020-06-23 ENCOUNTER — Inpatient Hospital Stay (HOSPITAL_COMMUNITY)
Admission: EM | Admit: 2020-06-23 | Discharge: 2020-06-25 | DRG: 065 | Disposition: A | Discharge status: Home / Self Care | Payer: Medicaid Other | Attending: Internal Medicine | Admitting: Internal Medicine

## 2020-06-23 ENCOUNTER — Inpatient Hospital Stay (HOSPITAL_COMMUNITY): Payer: Medicaid Other

## 2020-06-23 ENCOUNTER — Other Ambulatory Visit: Payer: Self-pay

## 2020-06-23 ENCOUNTER — Emergency Department (HOSPITAL_COMMUNITY): Payer: Medicaid Other

## 2020-06-23 ENCOUNTER — Encounter (HOSPITAL_COMMUNITY): Payer: Self-pay

## 2020-06-23 ENCOUNTER — Other Ambulatory Visit (HOSPITAL_COMMUNITY): Payer: Medicaid Other

## 2020-06-23 DIAGNOSIS — Z79899 Other long term (current) drug therapy: Secondary | ICD-10-CM | POA: Diagnosis not present

## 2020-06-23 DIAGNOSIS — Z5321 Procedure and treatment not carried out due to patient leaving prior to being seen by health care provider: Secondary | ICD-10-CM | POA: Diagnosis not present

## 2020-06-23 DIAGNOSIS — G8191 Hemiplegia, unspecified affecting right dominant side: Secondary | ICD-10-CM | POA: Diagnosis present

## 2020-06-23 DIAGNOSIS — I639 Cerebral infarction, unspecified: Secondary | ICD-10-CM

## 2020-06-23 DIAGNOSIS — I10 Essential (primary) hypertension: Secondary | ICD-10-CM | POA: Diagnosis present

## 2020-06-23 DIAGNOSIS — R29810 Facial weakness: Secondary | ICD-10-CM | POA: Diagnosis present

## 2020-06-23 DIAGNOSIS — I6389 Other cerebral infarction: Secondary | ICD-10-CM | POA: Diagnosis not present

## 2020-06-23 DIAGNOSIS — E785 Hyperlipidemia, unspecified: Secondary | ICD-10-CM | POA: Diagnosis present

## 2020-06-23 DIAGNOSIS — Z8673 Personal history of transient ischemic attack (TIA), and cerebral infarction without residual deficits: Secondary | ICD-10-CM | POA: Diagnosis not present

## 2020-06-23 DIAGNOSIS — R29702 NIHSS score 2: Secondary | ICD-10-CM | POA: Diagnosis present

## 2020-06-23 DIAGNOSIS — Z888 Allergy status to other drugs, medicaments and biological substances status: Secondary | ICD-10-CM

## 2020-06-23 DIAGNOSIS — G8929 Other chronic pain: Secondary | ICD-10-CM | POA: Diagnosis present

## 2020-06-23 DIAGNOSIS — E119 Type 2 diabetes mellitus without complications: Secondary | ICD-10-CM | POA: Diagnosis present

## 2020-06-23 DIAGNOSIS — Z886 Allergy status to analgesic agent status: Secondary | ICD-10-CM | POA: Diagnosis not present

## 2020-06-23 DIAGNOSIS — Z7951 Long term (current) use of inhaled steroids: Secondary | ICD-10-CM

## 2020-06-23 DIAGNOSIS — I6381 Other cerebral infarction due to occlusion or stenosis of small artery: Secondary | ICD-10-CM | POA: Diagnosis present

## 2020-06-23 DIAGNOSIS — F419 Anxiety disorder, unspecified: Secondary | ICD-10-CM | POA: Diagnosis present

## 2020-06-23 DIAGNOSIS — Z20822 Contact with and (suspected) exposure to covid-19: Secondary | ICD-10-CM | POA: Diagnosis present

## 2020-06-23 DIAGNOSIS — Z7982 Long term (current) use of aspirin: Secondary | ICD-10-CM

## 2020-06-23 DIAGNOSIS — Z7984 Long term (current) use of oral hypoglycemic drugs: Secondary | ICD-10-CM | POA: Diagnosis not present

## 2020-06-23 DIAGNOSIS — K219 Gastro-esophageal reflux disease without esophagitis: Secondary | ICD-10-CM | POA: Diagnosis present

## 2020-06-23 DIAGNOSIS — Z79891 Long term (current) use of opiate analgesic: Secondary | ICD-10-CM | POA: Diagnosis not present

## 2020-06-23 DIAGNOSIS — F17210 Nicotine dependence, cigarettes, uncomplicated: Secondary | ICD-10-CM | POA: Diagnosis present

## 2020-06-23 DIAGNOSIS — H18413 Arcus senilis, bilateral: Secondary | ICD-10-CM | POA: Diagnosis present

## 2020-06-23 DIAGNOSIS — M1990 Unspecified osteoarthritis, unspecified site: Secondary | ICD-10-CM | POA: Diagnosis present

## 2020-06-23 DIAGNOSIS — N6310 Unspecified lump in the right breast, unspecified quadrant: Secondary | ICD-10-CM | POA: Diagnosis present

## 2020-06-23 DIAGNOSIS — J449 Chronic obstructive pulmonary disease, unspecified: Secondary | ICD-10-CM | POA: Diagnosis present

## 2020-06-23 DIAGNOSIS — Z8249 Family history of ischemic heart disease and other diseases of the circulatory system: Secondary | ICD-10-CM

## 2020-06-23 DIAGNOSIS — Z91013 Allergy to seafood: Secondary | ICD-10-CM | POA: Diagnosis not present

## 2020-06-23 DIAGNOSIS — I152 Hypertension secondary to endocrine disorders: Secondary | ICD-10-CM | POA: Diagnosis present

## 2020-06-23 DIAGNOSIS — Z833 Family history of diabetes mellitus: Secondary | ICD-10-CM

## 2020-06-23 HISTORY — DX: Cerebral infarction, unspecified: I63.9

## 2020-06-23 LAB — CBC
HCT: 42.8 % (ref 39.0–52.0)
Hemoglobin: 13.8 g/dL (ref 13.0–17.0)
MCH: 29.1 pg (ref 26.0–34.0)
MCHC: 32.2 g/dL (ref 30.0–36.0)
MCV: 90.3 fL (ref 80.0–100.0)
Platelets: 240 10*3/uL (ref 150–400)
RBC: 4.74 MIL/uL (ref 4.22–5.81)
RDW: 14.6 % (ref 11.5–15.5)
WBC: 6.2 10*3/uL (ref 4.0–10.5)
nRBC: 0 % (ref 0.0–0.2)

## 2020-06-23 LAB — DIFFERENTIAL
Abs Immature Granulocytes: 0.02 10*3/uL (ref 0.00–0.07)
Basophils Absolute: 0 10*3/uL (ref 0.0–0.1)
Basophils Relative: 0 %
Eosinophils Absolute: 0 10*3/uL (ref 0.0–0.5)
Eosinophils Relative: 0 %
Immature Granulocytes: 0 %
Lymphocytes Relative: 30 %
Lymphs Abs: 1.9 10*3/uL (ref 0.7–4.0)
Monocytes Absolute: 0.6 10*3/uL (ref 0.1–1.0)
Monocytes Relative: 9 %
Neutro Abs: 3.7 10*3/uL (ref 1.7–7.7)
Neutrophils Relative %: 61 %

## 2020-06-23 LAB — COMPREHENSIVE METABOLIC PANEL
ALT: 20 U/L (ref 0–44)
AST: 18 U/L (ref 15–41)
Albumin: 4 g/dL (ref 3.5–5.0)
Alkaline Phosphatase: 48 U/L (ref 38–126)
Anion gap: 7 (ref 5–15)
BUN: 13 mg/dL (ref 6–20)
CO2: 28 mmol/L (ref 22–32)
Calcium: 9.5 mg/dL (ref 8.9–10.3)
Chloride: 102 mmol/L (ref 98–111)
Creatinine, Ser: 0.8 mg/dL (ref 0.61–1.24)
GFR, Estimated: >60 mL/min (ref >60)
Glucose, Bld: 115 mg/dL — ABNORMAL HIGH (ref 70–99)
Potassium: 4.1 mmol/L (ref 3.5–5.1)
Sodium: 137 mmol/L (ref 135–145)
Total Bilirubin: 0.6 mg/dL (ref 0.3–1.2)
Total Protein: 6.6 g/dL (ref 6.5–8.1)

## 2020-06-23 LAB — I-STAT CHEM 8, ED
BUN: 15 mg/dL (ref 6–20)
Calcium, Ion: 1.23 mmol/L (ref 1.15–1.40)
Chloride: 101 mmol/L (ref 98–111)
Creatinine, Ser: 0.7 mg/dL (ref 0.61–1.24)
Glucose, Bld: 110 mg/dL — ABNORMAL HIGH (ref 70–99)
HCT: 42 % (ref 39.0–52.0)
Hemoglobin: 14.3 g/dL (ref 13.0–17.0)
Potassium: 4 mmol/L (ref 3.5–5.1)
Sodium: 137 mmol/L (ref 135–145)
TCO2: 27 mmol/L (ref 22–32)

## 2020-06-23 LAB — CBG MONITORING, ED
Glucose-Capillary: 96 mg/dL (ref 70–99)
Glucose-Capillary: 121 mg/dL — ABNORMAL HIGH (ref 70–99)

## 2020-06-23 LAB — RAPID URINE DRUG SCREEN, HOSP PERFORMED
Amphetamines: NOT DETECTED
Barbiturates: NOT DETECTED
Benzodiazepines: POSITIVE — AB
Cocaine: NOT DETECTED
Opiates: NOT DETECTED
Tetrahydrocannabinol: NOT DETECTED

## 2020-06-23 LAB — PROTIME-INR
INR: 0.9 (ref 0.8–1.2)
Prothrombin Time: 12.3 seconds (ref 11.4–15.2)

## 2020-06-23 LAB — APTT: aPTT: 26 seconds (ref 24–36)

## 2020-06-23 LAB — SARS CORONAVIRUS 2 (TAT 6-24 HRS): SARS Coronavirus 2: NEGATIVE

## 2020-06-23 MED ORDER — ACETAMINOPHEN 325 MG PO TABS: 650.0000 mg | ORAL_TABLET | ORAL | Status: DC | PRN

## 2020-06-23 MED ORDER — INSULIN ASPART 100 UNIT/ML IJ SOLN: 0.0000 [IU] | Freq: Three times a day (TID) | INTRAMUSCULAR | Status: DC

## 2020-06-23 MED ORDER — METAXALONE 800 MG PO TABS
800.0000 mg | ORAL_TABLET | Freq: Three times a day (TID) | ORAL | Status: DC | PRN
  Filled 2020-06-23: qty 1

## 2020-06-23 MED ORDER — NICOTINE 14 MG/24HR TD PT24
14.0000 mg | MEDICATED_PATCH | Freq: Every day | TRANSDERMAL | Status: DC
  Administered 2020-06-23 – 2020-06-25 (×3): 14 mg via TRANSDERMAL
  Filled 2020-06-23 (×3): qty 1

## 2020-06-23 MED ORDER — ACETAMINOPHEN 650 MG RE SUPP: 650.0000 mg | RECTAL | Status: DC | PRN

## 2020-06-23 MED ORDER — OXYCODONE HCL 5 MG PO TABS
15.0000 mg | ORAL_TABLET | Freq: Four times a day (QID) | ORAL | Status: DC | PRN
  Administered 2020-06-23 – 2020-06-25 (×6): 15 mg via ORAL
  Filled 2020-06-23 (×6): qty 3

## 2020-06-23 MED ORDER — MONTELUKAST SODIUM 10 MG PO TABS
10.0000 mg | ORAL_TABLET | Freq: Every day | ORAL | Status: DC
  Administered 2020-06-23 – 2020-06-24 (×2): 10 mg via ORAL
  Filled 2020-06-23 (×2): qty 1

## 2020-06-23 MED ORDER — SODIUM CHLORIDE 0.9% FLUSH
3.0000 mL | Freq: Once | INTRAVENOUS | Status: AC
  Administered 2020-06-23: 3 mL via INTRAVENOUS

## 2020-06-23 MED ORDER — MOMETASONE FURO-FORMOTEROL FUM 200-5 MCG/ACT IN AERO
2.0000 | INHALATION_SPRAY | Freq: Two times a day (BID) | RESPIRATORY_TRACT | Status: DC
  Administered 2020-06-24 – 2020-06-25 (×3): 2 via RESPIRATORY_TRACT
  Filled 2020-06-23: qty 8.8

## 2020-06-23 MED ORDER — PANTOPRAZOLE SODIUM 40 MG PO TBEC
40.0000 mg | DELAYED_RELEASE_TABLET | Freq: Every day | ORAL | Status: DC
  Administered 2020-06-24 – 2020-06-25 (×2): 40 mg via ORAL
  Filled 2020-06-23 (×2): qty 1

## 2020-06-23 MED ORDER — ENOXAPARIN SODIUM 40 MG/0.4ML IJ SOSY
40.0000 mg | PREFILLED_SYRINGE | INTRAMUSCULAR | Status: DC
  Administered 2020-06-23 – 2020-06-24 (×2): 40 mg via SUBCUTANEOUS
  Filled 2020-06-23 (×2): qty 0.4

## 2020-06-23 MED ORDER — LORAZEPAM 2 MG/ML IJ SOLN
1.0000 mg | INTRAMUSCULAR | Status: DC | PRN
  Administered 2020-06-23 (×2): 1 mg via INTRAVENOUS
  Filled 2020-06-23 (×2): qty 1

## 2020-06-23 MED ORDER — ASPIRIN 325 MG PO TABS
325.0000 mg | ORAL_TABLET | Freq: Every day | ORAL | Status: DC
  Administered 2020-06-23: 325 mg via ORAL
  Filled 2020-06-23: qty 1

## 2020-06-23 MED ORDER — TAMSULOSIN HCL 0.4 MG PO CAPS
0.4000 mg | ORAL_CAPSULE | Freq: Every evening | ORAL | Status: DC
  Administered 2020-06-23 – 2020-06-24 (×2): 0.4 mg via ORAL
  Filled 2020-06-23 (×2): qty 1

## 2020-06-23 MED ORDER — SODIUM CHLORIDE 0.9 % IV SOLN: INTRAVENOUS | Status: DC

## 2020-06-23 MED ORDER — ALBUTEROL SULFATE (2.5 MG/3ML) 0.083% IN NEBU
2.5000 mg | INHALATION_SOLUTION | RESPIRATORY_TRACT | Status: DC | PRN
  Administered 2020-06-23 – 2020-06-24 (×2): 2.5 mg via RESPIRATORY_TRACT
  Filled 2020-06-23 (×2): qty 3

## 2020-06-23 MED ORDER — ACETAMINOPHEN 160 MG/5ML PO SOLN: 650.0000 mg | ORAL | Status: DC | PRN

## 2020-06-23 MED ORDER — HYDROXYZINE HCL 25 MG PO TABS
25.0000 mg | ORAL_TABLET | Freq: Two times a day (BID) | ORAL | Status: DC | PRN
  Filled 2020-06-23: qty 1

## 2020-06-23 MED ORDER — UMECLIDINIUM BROMIDE 62.5 MCG/INH IN AEPB
1.0000 | INHALATION_SPRAY | Freq: Every day | RESPIRATORY_TRACT | Status: DC
  Administered 2020-06-24 – 2020-06-25 (×2): 1 via RESPIRATORY_TRACT
  Filled 2020-06-23: qty 7

## 2020-06-23 MED ORDER — SENNOSIDES-DOCUSATE SODIUM 8.6-50 MG PO TABS: 1.0000 | ORAL_TABLET | Freq: Every evening | ORAL | Status: DC | PRN

## 2020-06-23 MED ORDER — LUBIPROSTONE 24 MCG PO CAPS
24.0000 ug | ORAL_CAPSULE | Freq: Every day | ORAL | Status: DC
  Administered 2020-06-23 – 2020-06-24 (×2): 24 ug via ORAL
  Filled 2020-06-23 (×3): qty 1

## 2020-06-23 MED ORDER — STROKE: EARLY STAGES OF RECOVERY BOOK
Freq: Once | Status: AC
  Filled 2020-06-23: qty 1

## 2020-06-23 MED ORDER — GABAPENTIN 300 MG PO CAPS
300.0000 mg | ORAL_CAPSULE | Freq: Three times a day (TID) | ORAL | Status: DC
Start: 2020-06-23 — End: 2020-06-25
  Administered 2020-06-23 – 2020-06-25 (×5): 300 mg via ORAL
  Filled 2020-06-23 (×5): qty 1

## 2020-06-23 MED ORDER — ATORVASTATIN CALCIUM 40 MG PO TABS
40.0000 mg | ORAL_TABLET | Freq: Every day | ORAL | Status: DC
  Administered 2020-06-23 – 2020-06-25 (×3): 40 mg via ORAL
  Filled 2020-06-23 (×3): qty 1

## 2020-06-23 NOTE — ED Provider Notes (Signed)
[facility] EMERGENCY DEPARTMENT Provider Note   CSN: [id number] Arrival date & time: 06/23/20  3335     History Chief Complaint  Patient presents with  . Weakness    Glenn [name] is a 60 y.o. male.  HPI Patient presents for concern of gait difficulty and right-sided weakness.  Patient has a history of prior stroke, does not smoke, does not drink, was in his usual state of health until 2 days ago.  Initially patient states that his symptoms were new this morning, but after our initial conversation patient is accompanied by male companion.  She notes that the patient's symptoms have been present for 2 days.  Without clear precipitant he developed difficulty with ambulation beyond baseline.  He also developed new weakness in his right extremities.  No facial asymmetry, no speech difficulty.  No falls, no pain.  No relief with anything.  With persistent symptoms today he presents for evaluation.    Past Medical History:  Diagnosis Date  . Arthritis   . Asthma   . Chronic pain 05/06/2020  . COPD (chronic obstructive pulmonary disease) (HCC)   . CVA (cerebral vascular accident) (HCC)    2010; 06/2020  . Diabetes mellitus type 2 in nonobese (HCC)   . ED (erectile dysfunction)   . Hypertension   . Tobacco dependence 05/06/2020    Patient Active Problem List   Diagnosis Date Noted  . Acute CVA (cerebrovascular accident) (HCC) 06/23/2020  . Daytime sleepiness 06/09/2020  . Malnutrition of moderate degree 05/07/2020  . Chronic pain 05/06/2020  . Tobacco dependence 05/06/2020  . Hypertension   . Diabetes mellitus type 2 in nonobese (HCC)   . COPD (chronic obstructive pulmonary disease) (HCC) 04/11/2020  . Elevated troponin 04/11/2020  . Acute on chronic respiratory failure with hypoxia (HCC) 04/11/2020  . Elevated transaminase level   . COPD with acute exacerbation (HCC) 06/30/2019  . Acute respiratory failure with hypoxemia (HCC) 06/30/2019  . Hypotension  06/30/2019  . Near syncope 06/30/2019  . Pleural nodule 06/30/2019  . Colon cancer screening 12/18/2017  . History of colonic polyps 12/18/2017  . 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 COPD GOLD III/ actively smoking  10/05/2016    Past Surgical History:  Procedure Laterality Date  . LUNG SURGERY     removed tissue       Family History  Problem Relation Age of Onset  . Hypertension Mother   . Diabetes Maternal Grandmother   . Colon cancer Neg Hx   . Esophageal cancer Neg Hx   . Rectal cancer Neg Hx   . Inflammatory bowel disease Neg Hx   . Liver disease Neg Hx   . Pancreatic cancer Neg Hx     Social History   Tobacco Use  . Smoking status: Former Smoker    Packs/day: 0.50    Years: 34.00    Pack years: 17.00    Types: Cigarettes    Quit date: 05/11/2020    Years since quitting: 0.1  . Smokeless tobacco: Never Used  . Tobacco comment: at the most smoked 6-7 cigarettes/day  Vaping Use  . Vaping Use: Never used  Substance Use Topics  . Alcohol use: Never  . Drug use: Never    Home Medications Prior to Admission medications   Medication Sig Start Date End Date Taking? Authorizing Provider  albuterol (PROVENTIL HFA) 108 (90 Base) MCG/ACT inhaler Inhale 2 puffs into the lungs every 4 (four) hours as needed for wheezing or shortness of breath. 11/20/18  [name], MD  albuterol (PROVENTIL) (2.5 MG/3ML) 0.083% nebulizer solution PLEASE SEE ATTACHED FOR DETAILED DIRECTIONS Patient taking differently: Take 2.5 mg by nebulization every 6 (six) hours as needed for wheezing or shortness of breath. 02/26/18   [name], MD  AMITIZA 24 MCG capsule Take 24 mcg by mouth at bedtime. 03/31/20   [provider]  amLODipine (NORVASC) 10 MG tablet Take 10 mg by mouth daily.    [provider]  ASPIRIN LOW DOSE 81 MG EC tablet Take 81 mg by mouth daily. 04/29/19   [provider]  benzonatate (TESSALON) 100 MG capsule Take 2 capsules (200 mg total) by  mouth every 8 (eight) hours as needed for cough. 06/09/20   Parrett, Virgel Bouquet, NP  budesonide-formoterol (SYMBICORT) 160-4.5 MCG/ACT inhaler Inhale 2 puffs into the lungs 2 (two) times daily. 06/09/20   Parrett, Virgel Bouquet, NP  cetirizine (ZYRTEC ALLERGY) 10 MG tablet Take 1 tablet (10 mg total) by mouth at bedtime. 06/09/20   Parrett, Virgel Bouquet, NP  ferrous sulfate 325 (65 FE) MG tablet Take 1 tablet (325 mg total) by mouth daily with breakfast. 05/10/20 06/09/20  Briant Cedar, MD  gabapentin (NEURONTIN) 300 MG capsule Take 1 capsule (300 mg total) by mouth 3 (three) times daily. 02/09/18   Eustace Moore, MD  glipiZIDE (GLUCOTROL) 5 MG tablet Take 0.5 tablets (2.5 mg total) by mouth 2 (two) times daily for 20 days. Patient not taking: Reported on 05/23/2020 04/13/20 05/03/20  Regalado, Jon Billings A, MD  hydrochlorothiazide (HYDRODIURIL) 12.5 MG tablet Take 12.5 mg by mouth daily. 03/26/20   [provider]  hydrOXYzine (ATARAX/VISTARIL) 25 MG tablet Take 25 mg by mouth 2 (two) times daily as needed for anxiety. 04/14/20   [provider]  metaxalone (SKELAXIN) 800 MG tablet Take 1 tablet (800 mg total) by mouth 3 (three) times daily. Patient taking differently: Take 800 mg by mouth 3 (three) times daily as needed for muscle spasms. 01/30/20   Rhys Martini, PA-C  metFORMIN (GLUCOPHAGE) 500 MG tablet Take 500 mg by mouth every morning. 04/17/20   [provider]  montelukast (SINGULAIR) 10 MG tablet Take 1 tablet (10 mg total) by mouth at bedtime. 06/09/20   Parrett, Virgel Bouquet, NP  Multiple Vitamin (MULTIVITAMIN WITH MINERALS) TABS tablet Take 1 tablet by mouth daily. 05/10/20   Briant Cedar, MD  nicotine (NICODERM CQ) 14 mg/24hr patch Place 1 patch (14 mg total) onto the skin daily. 06/09/20   Parrett, Virgel Bouquet, NP  nitroGLYCERIN (NITROSTAT) 0.4 MG SL tablet Place 0.4 mg under the tongue every 5 (five) minutes as needed for chest pain.    [provider]  oxyCODONE (ROXICODONE)  15 MG immediate release tablet Take 15 mg by mouth every 6 (six) hours as needed for pain. 03/31/20   [provider]  pantoprazole (PROTONIX) 40 MG tablet Take 1 tablet (40 mg total) by mouth daily. 07/02/19   Elgergawy, Leana Roe, MD  sildenafil (VIAGRA) 100 MG tablet Take 1 tablet (100 mg total) by mouth daily as needed. Patient taking differently: Take 100 mg by mouth daily as needed for erectile dysfunction. 05/01/20     tamsulosin (FLOMAX) 0.4 MG CAPS capsule Take 0.4 mg by mouth every evening. 06/26/19   [provider]  Tiotropium Bromide Monohydrate (SPIRIVA RESPIMAT) 2.5 MCG/ACT AERS 2 puffs each am 01/15/20   [name], MD  vitamin B-12 1000 MCG tablet Take 1 tablet (1,000 mcg total)  by mouth daily. 05/10/20   Briant CedarEzenduka, Nkeiruka J, MD  Vitamin D, Ergocalciferol, (DRISDOL) 1.25 MG (50000 UNIT) CAPS capsule Take 50,000 Units by mouth once a week. Mondays 05/02/19   [provider]    Allergies    Insulins, Ibuprofen, Shrimp [shellfish allergy], Tylenol [acetaminophen], and Lisinopril  Review of Systems   Review of Systems  Constitutional:       Per HPI, otherwise negative  HENT:       Per HPI, otherwise negative  Respiratory:       Per HPI, otherwise negative  Cardiovascular:       Per HPI, otherwise negative  Gastrointestinal: Negative for vomiting.  Endocrine:       Negative aside from HPI  Genitourinary:       Neg aside from HPI   Musculoskeletal:       Per HPI, otherwise negative  Skin: Negative.   Neurological: Positive for weakness. Negative for syncope.    Physical Exam Updated Vital Signs BP (!) 142/100   Pulse 96   Temp 98.5 F (36.9 C)   Resp 20   SpO2 96%   Physical Exam Vitals and nursing note reviewed.  Constitutional:      General: He is not in acute distress.    Appearance: He is well-developed.  HENT:     Head: Normocephalic and atraumatic.  Eyes:     Conjunctiva/sclera: Conjunctivae normal.  Cardiovascular:      Rate and Rhythm: Normal rate and regular rhythm.  Pulmonary:     Effort: Pulmonary effort is normal. No respiratory distress.     Breath sounds: No stridor.  Abdominal:     General: There is no distension.  Musculoskeletal:     Right lower leg: No edema.     Left lower leg: No edema.  Skin:    General: Skin is warm and dry.  Neurological:     Mental Status: He is alert and oriented to person, place, and time.     Motor: Weakness present. No tremor or atrophy.     Coordination: Romberg sign positive.     Comments: Right upper and lower extremity 4/5, proximal, distal.  No facial asymmetry, speech is clear.      ED Results / Procedures / Treatments   Labs (all labs ordered are listed, but only abnormal results are displayed) Labs Reviewed  COMPREHENSIVE METABOLIC PANEL - Abnormal; Notable for the following components:      Result Value   Glucose, Bld 115 (*)    All other components within normal limits  I-STAT CHEM 8, ED - Abnormal; Notable for the following components:   Glucose, Bld 110 (*)    All other components within normal limits  SARS CORONAVIRUS 2 (TAT 6-24 HRS)  PROTIME-INR  APTT  CBC  DIFFERENTIAL  HEMOGLOBIN A1C  LIPID PANEL  CBG MONITORING, ED    EKG EKG Interpretation  Date/Time:  Tuesday Jun 23 2020 08:20:42 EDT Ventricular Rate:  79 PR Interval:  136 QRS Duration: 84 QT Interval:  358 QTC Calculation: 410 R Axis:   29 Text Interpretation: Normal sinus rhythm Nonspecific T wave abnormality Abnormal ECG Confirmed by Gerhard MunchLockwood, Abhiram Criado [phone]([id number]) on 06/23/2020 9:45:53 AM   Radiology CT Head Wo Contrast  Result Date: 06/23/2020 CLINICAL DATA:  Right side weakness EXAM: CT HEAD WITHOUT CONTRAST TECHNIQUE: Contiguous axial images were obtained from the base of the skull through the vertex without intravenous contrast. COMPARISON:  04/15/2020 FINDINGS: Brain: Chronic small vessel disease throughout the deep  white matter. No acute intracranial abnormality.  Specifically, no hemorrhage, hydrocephalus, mass lesion, acute infarction, or significant intracranial injury. Vascular: No hyperdense vessel or unexpected calcification. Skull: No acute calvarial abnormality. Sinuses/Orbits: No acute findings Other: None IMPRESSION: Chronic small vessel disease. No acute intracranial abnormality. Electronically Signed   By: Charlett Nose M.D.   On: 06/23/2020 09:14   MR BRAIN WO CONTRAST  Result Date: 06/23/2020 CLINICAL DATA:  Neuro deficit, acute, stroke suspected. EXAM: MRI HEAD WITHOUT CONTRAST TECHNIQUE: Multiplanar, multiecho pulse sequences of the brain and surrounding structures were obtained without intravenous contrast. COMPARISON:  Head CT Jun 23, 2020 FINDINGS: Brain: Focus of restricted diffusion within the posterior limb of left internal capsule, consistent with recent infarct. No hemorrhage, hydrocephalus, extra-axial collection or mass lesion scattered and confluent foci of T2 hyperintensity are seen within the white matter of the cerebral hemispheres, nonspecific, most likely related to chronic small vessel ischemia. Vascular: Normal flow voids. Skull and upper cervical spine: Normal marrow signal. Sinuses/Orbits: Negative. Other: Mild left mastoid effusion. IMPRESSION: 1. Acute/subacute infarct within the posterior limb of the left internal capsule. 2. 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 These results were called via secure text paging at the time of interpretation on 06/23/2020 at 12:48 pm to Dr. Milon Dikes. Electronically Signed   By: Baldemar Lenis M.D.   On: 06/23/2020 12:48    Procedures Procedures   Medications Ordered in ED Medications  LORazepam (ATIVAN) injection 1 mg (1 mg Intravenous Given 06/23/20 1414)  sodium chloride flush (NS) 0.9 % injection 3 mL (3 mLs Intravenous Given 06/23/20 1053)    ED Course  I have reviewed the triage vital signs and the nursing notes.  Pertinent labs &  imaging results that were available during my care of the patient were reviewed by me and considered in my medical decision making (see chart for details).   With consideration of stroke versus other cause for his weakness, patient had stat CT, was placed on continuous cardiac monitoring, labs, MRI ordered. Cardiac monitor, 80s, sinus, unremarkable Pulse oximetry 98% room air normal  2:24 PM Patient in no distress.  He and his wife are aware of all findings.  He has been seen and evaluated by our neurology colleagues.   MDM Number of Diagnoses or Management Options Acute ischemic stroke [facility]): new, needed workup   Amount and/or Complexity of Data Reviewed Clinical lab tests: ordered and reviewed Tests in the radiology section of CPT: ordered and reviewed Tests in the medicine section of CPT: reviewed and ordered Decide to obtain previous medical records or to obtain history from someone other than the patient: yes Obtain history from someone other than the patient: yes Review and summarize past medical records: yes Discuss the patient with other providers: yes Independent visualization of images, tracings, or specimens: yes  Risk of Complications, Morbidity, and/or Mortality Presenting problems: high Diagnostic procedures: high Management options: high  Critical Care Total time providing critical care: < 30 minutes  Patient Progress Patient progress: stable  Final Clinical Impression(s) / ED Diagnoses Final diagnoses:  Acute ischemic stroke [facility])    Rx / DC Orders ED Discharge Orders    None       Gerhard Munch, MD 06/23/20 1427

## 2020-06-23 NOTE — Consult Note (Signed)
Neurology Consultation  Reason for Consult: Mri with acute/subacute infarct in the left internal capsule. Right arm and leg weakness. Referring Physician: Dr. Jeraldine LootsLockwood  CC: Right arm and leg weakness  History is obtained from: Patient, Chart Review  HPI: Glenn [name] is a 60 y.o. male with a medical history significant for hypertension, type 2 diabetes mellitus, and tobacco use who presented to the ED for evaluation of right arm and lower extremity weakness. Patient states that he was in his normal state of health when he went to bed at 21:00 last night but when he woke he had lost function of his right leg and some function of his right arm. He states that when he attempts to walk, he falls towards the right. He complains that the right lower extremity is weaker than the right upper extremity weakness.   LKW: 21:00 5/16 when he went to bed; woke at 07:00 with deficits tpa given?: no, outside of thrombolytic therapy window IR Thrombectomy? No, presentation not consistent with LVO Modified Rankin Scale: 0-Completely asymptomatic and back to baseline post- stroke  ROS: A complete ROS was performed and is negative except as noted in the HPI.   Past Medical History:  Diagnosis Date  . Arthritis   . Asthma   . Chronic pain 05/06/2020  . COPD (chronic obstructive pulmonary disease) (HCC)   . Diabetes mellitus type 2 in nonobese (HCC)   . ED (erectile dysfunction)   . Hypertension   . Tobacco dependence 05/06/2020   Past Surgical History:  Procedure Laterality Date  . LUNG SURGERY     removed tissue   Family History  Problem Relation Age of Onset  . Hypertension Mother   . Diabetes Maternal Grandmother   . Colon cancer Neg Hx   . Esophageal cancer Neg Hx   . Rectal cancer Neg Hx   . Inflammatory bowel disease Neg Hx   . Liver disease Neg Hx   . Pancreatic cancer Neg Hx    Social History:   reports that he quit smoking about 6 weeks ago. His smoking use included cigarettes. He has  a 17.00 pack-year smoking history. He has never used smokeless tobacco. He reports that he does not drink alcohol and does not use drugs.  Medications  Current Facility-Administered Medications:  .  LORazepam (ATIVAN) injection 1 mg, 1 mg, Intravenous, Q4H PRN, Gerhard MunchLockwood, Robert, MD, 1 mg at 06/23/20 1056  Current Outpatient Medications:  .  albuterol (PROVENTIL HFA) 108 (90 Base) MCG/ACT inhaler, Inhale 2 puffs into the lungs every 4 (four) hours as needed for wheezing or shortness of breath., Disp: 1 g, Rfl: 11 .  albuterol (PROVENTIL) (2.5 MG/3ML) 0.083% nebulizer solution, PLEASE SEE ATTACHED FOR DETAILED DIRECTIONS (Patient taking differently: Take 2.5 mg by nebulization every 6 (six) hours as needed for wheezing or shortness of breath.), Disp: 150 mL, Rfl: 2 .  AMITIZA 24 MCG capsule, Take 24 mcg by mouth at bedtime., Disp: , Rfl:  .  amLODipine (NORVASC) 10 MG tablet, Take 10 mg by mouth daily., Disp: , Rfl:  .  ASPIRIN LOW DOSE 81 MG EC tablet, Take 81 mg by mouth daily., Disp: , Rfl:  .  benzonatate (TESSALON) 100 MG capsule, Take 2 capsules (200 mg total) by mouth every 8 (eight) hours as needed for cough., Disp: 20 capsule, Rfl: 0 .  budesonide-formoterol (SYMBICORT) 160-4.5 MCG/ACT inhaler, Inhale 2 puffs into the lungs 2 (two) times daily., Disp: 1 each, Rfl: 11 .  cetirizine (ZYRTEC ALLERGY) 10  MG tablet, Take 1 tablet (10 mg total) by mouth at bedtime., Disp: 30 tablet, Rfl: 6 .  ferrous sulfate 325 (65 FE) MG tablet, Take 1 tablet (325 mg total) by mouth daily with breakfast., Disp: 30 tablet, Rfl: 0 .  gabapentin (NEURONTIN) 300 MG capsule, Take 1 capsule (300 mg total) by mouth 3 (three) times daily., Disp: 50 capsule, Rfl: 0 .  glipiZIDE (GLUCOTROL) 5 MG tablet, Take 0.5 tablets (2.5 mg total) by mouth 2 (two) times daily for 20 days. (Patient not taking: Reported on 05/23/2020), Disp: 20 tablet, Rfl: 0 .  hydrochlorothiazide (HYDRODIURIL) 12.5 MG tablet, Take 12.5 mg by mouth  daily., Disp: , Rfl:  .  hydrOXYzine (ATARAX/VISTARIL) 25 MG tablet, Take 25 mg by mouth 2 (two) times daily as needed for anxiety., Disp: , Rfl: unk .  metaxalone (SKELAXIN) 800 MG tablet, Take 1 tablet (800 mg total) by mouth 3 (three) times daily. (Patient taking differently: Take 800 mg by mouth 3 (three) times daily as needed for muscle spasms.), Disp: 21 tablet, Rfl: 0 .  metFORMIN (GLUCOPHAGE) 500 MG tablet, Take 500 mg by mouth every morning., Disp: , Rfl:  .  montelukast (SINGULAIR) 10 MG tablet, Take 1 tablet (10 mg total) by mouth at bedtime., Disp: 30 tablet, Rfl: 6 .  Multiple Vitamin (MULTIVITAMIN WITH MINERALS) TABS tablet, Take 1 tablet by mouth daily., Disp: 30 tablet, Rfl: 0 .  nicotine (NICODERM CQ) 14 mg/24hr patch, Place 1 patch (14 mg total) onto the skin daily., Disp: 28 patch, Rfl: 6 .  nitroGLYCERIN (NITROSTAT) 0.4 MG SL tablet, Place 0.4 mg under the tongue every 5 (five) minutes as needed for chest pain., Disp: , Rfl:  .  oxyCODONE (ROXICODONE) 15 MG immediate release tablet, Take 15 mg by mouth every 6 (six) hours as needed for pain., Disp: , Rfl:  .  pantoprazole (PROTONIX) 40 MG tablet, Take 1 tablet (40 mg total) by mouth daily., Disp: 30 tablet, Rfl: 0 .  sildenafil (VIAGRA) 100 MG tablet, Take 1 tablet (100 mg total) by mouth daily as needed. (Patient taking differently: Take 100 mg by mouth daily as needed for erectile dysfunction.), Disp: 30 tablet, Rfl: 5 .  tamsulosin (FLOMAX) 0.4 MG CAPS capsule, Take 0.4 mg by mouth every evening., Disp: , Rfl:  .  Tiotropium Bromide Monohydrate (SPIRIVA RESPIMAT) 2.5 MCG/ACT AERS, 2 puffs each am, Disp: 1 each, Rfl: 11 .  vitamin B-12 1000 MCG tablet, Take 1 tablet (1,000 mcg total) by mouth daily., Disp: 30 tablet, Rfl: 0 .  Vitamin D, Ergocalciferol, (DRISDOL) 1.25 MG (50000 UNIT) CAPS capsule, Take 50,000 Units by mouth once a week. Mondays, Disp: , Rfl:   Exam: Current vital signs: BP (!) 142/100   Pulse 96   Temp 98.5  F (36.9 C)   Resp 20   SpO2 96%  Vital signs in last 24 hours: Temp:  [98.5 F (36.9 C)] 98.5 F (36.9 C) (05/17 0815) Pulse Rate:  [79-96] 96 (05/17 1100) Resp:  [18-25] 20 (05/17 1100) BP: (140-157)/(89-108) 142/100 (05/17 1045) SpO2:  [96 %-97 %] 96 % (05/17 1100)  GENERAL: Awake, alert, sitting up in bed, in no acute distress Psych: Affect appropriate for situation, patient calm and cooperative with examination Head: Normocephalic and atraumatic EENT: Arcus senilis present bilaterally, normal conjunctivae, wears eyeglasses at baseline LUNGS: Normal respiratory effort. Non-labored breathing CV: Regular rate on tele ABDOMEN: Soft, non-distended Ext: without obvious deformity, cool to touch  NEURO:  Mental Status: Awake, alert,  and oriented to person, place, time, and situation. He is able to correctly state the month, year, and his age. He is able to provide a clear and coherent history of present illness.  Speech is intact without dysarthria. Naming, repetition, fluency, and comprehension intact. No aphasia or neglect noted. Cranial Nerves:  II: PERRL 2 mm/brisk. Visual fields full.  III, IV, VI: EOMI without ptosis  V: Sensation is intact to light touch and symmetrical to face.   VII: Face is symmetric resting and smiling.  VIII: Hearing is intact to voice IX, X: Palate elevation is symmetric. Phonation normal.  XI: Normal sternocleidomastoid and trapezius muscle strength XII: Tongue protrudes midline without fasciculations.   Motor: 5/5 strength present in left upper and lower extremities without vertical drift, 4/5 strength present in right upper and lower extremities with vertical drift on assessment. Tone is normal. Bulk is normal.  Sensation: Intact to light touch bilaterally in all four extremities. No extinction to DSS present.  Coordination: FTN intact bilaterally. HKS intact on left lower extremity, right lower extremity weak but without ataxia.  Gait:  Deferred  NIHSS: 1a Level of Conscious.: 0 1b LOC Questions: 0 1c LOC Commands: 0 2 Best Gaze: 0 3 Visual: 0 4 Facial Palsy: 0 5a Motor Arm - left: 0 5b Motor Arm - Right: 1 6a Motor Leg - Left: 0 6b Motor Leg - Right: 1 7 Limb Ataxia: 0 8 Sensory: 0 9 Best Language: 0 10 Dysarthria: 0 11 Extinct. and Inatten.: 0 TOTAL: 2  Labs I have reviewed labs in epic and the results pertinent to this consultation are: CBC    Component Value Date/Time   WBC 6.2 06/23/2020 0841   RBC 4.74 06/23/2020 0841   HGB 14.3 06/23/2020 0918   HCT 42.0 06/23/2020 0918   PLT 240 06/23/2020 0841   MCV 90.3 06/23/2020 0841   MCH 29.1 06/23/2020 0841   MCHC 32.2 06/23/2020 0841   RDW 14.6 06/23/2020 0841   LYMPHSABS 1.9 06/23/2020 0841   MONOABS 0.6 06/23/2020 0841   EOSABS 0.0 06/23/2020 0841   BASOSABS 0.0 06/23/2020 0841   CMP     Component Value Date/Time   NA 137 06/23/2020 0918   K 4.0 06/23/2020 0918   CL 101 06/23/2020 0918   CO2 28 06/23/2020 0841   GLUCOSE 110 (H) 06/23/2020 0918   BUN 15 06/23/2020 0918   CREATININE 0.70 06/23/2020 0918   CALCIUM 9.5 06/23/2020 0841   PROT 6.6 06/23/2020 0841   ALBUMIN 4.0 06/23/2020 0841   AST 18 06/23/2020 0841   ALT 20 06/23/2020 0841   ALKPHOS 48 06/23/2020 0841   BILITOT 0.6 06/23/2020 0841   GFRNONAA >60 06/23/2020 0841   GFRAA >60 07/01/2019 0625   Lipid Panel  No results found for: CHOL, TRIG, HDL, CHOLHDL, VLDL, LDLCALC, LDLDIRECT  Lab Results  Component Value Date   HGBA1C 6.6 (H) 04/12/2020   Imaging I have reviewed the images obtained:  CT-scan of the brain: Chronic small vessel disease. No acute intracranial abnormality.  MRI examination of the brain: 1. Acute/subacute infarct within the posterior limb of the left internal capsule. 2. 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  Assessment: 60 y.o. male with PMHx of HTN, DM, and tobacco use who presented to the ED for evaluation of  right upper and lower extremity weakness. Patient went to bed at 21:00 at baseline function, woke at 07:00 with deficits. - Stroke risk factors include tobacco use, history of hypertension,  history of diabetes. - Examination reveals weakness of the right lower extremity greater than the right upper extremity with vertical drift on both but without sensory deficits. - Patient was not a candidate for thrombolytic therapy due to last known well at 21:00 on 5/16, not an endovascular candidate due to presentation not consistent with large vessel occlusion - MRI with acute / subacute infarct within the posterior limb of the left internal capsule. Further stroke work up pending.   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 Note/plan to be edited by MD as needed.  , AGAC-NP [facility] Pager: [phone]  Attending addendum Agree with history and physical that I also independently verified. Patient seen and examined for sudden onset of right arm and leg weakness along with difficulty walking noted to have a left internal capsule lacunar infarct on MRI.  Etiology likely secondary to small vessel disease.  Outside the window for tPA and location of the stroke not suggestive for large vessel etiology is not a candidate for EVT.  Requires a full stroke work-up as above. I have personally reviewed imaging.  I have also discussed the plan with the admitting  hospitalist Dr. Lanae Boast as well as the patient and his wife [phone] at bedside. The plan above was made under my supervision. Stroke team will follow.  -- Ophelia Charter, MD Neurologist [facility] Pager: [phone]

## 2020-06-23 NOTE — ED Provider Notes (Signed)
Emergency Medicine Provider Triage Evaluation Note  Glenn [name] , a 60 y.o. male  was evaluated in triage.  Pt complains of right-sided weakness.  Patient states that he went to bed around 930 last night feeling okay, woke up this morning around 5 am with right-sided weakness, was dragging his foot on the floor.  Also feels as if his right arm is starting to feel weak.  Patient states that he feels as if his equilibrium is off, complains of dizziness and is if he is off balance.  History of TIA in 2011 with similar deficits according to patient.  Patient denies any vision changes, aphasia, facial droop, slurred speech.  Does admit to headache for the last 2 days.  Review of Systems  Positive: Headache, right-sided weakness, dizziness, gait instability Negative:   Physical Exam  BP 140/89 (BP Location: Left Arm)   Pulse 82   Temp 98.5 F (36.9 C)   Resp 18   SpO2 96%  Gen:   Awake, no distress  Resp:  Normal effort  Neuro:  Alert. Clear speech. No facial droop. CNIII-XII grossly intact. Bilateral upper and lower extremities' sensation grossly intact.5 out of 5 strength in upper extremities bilaterally, 4 out of 5 strength to right lower extremity, 5 out of 5 strength to right lower extremity.   Normal finger to nose bilaterally. Negative pronator drift.  Positive Romberg sign.  Gait is unstable, no foot drag noted.   Medical Decision Making  Medically screening exam initiated at 8:51 AM.  Appropriate orders placed.  Glenn [name] was informed that the remainder of the evaluation will be completed by another provider, this initial triage assessment does not replace that evaluation, and the importance of remaining in the ED until their evaluation is complete.  Patient concerning for stroke, however not in 24-hour window.  Not Van positive.  Will speak to neurology, stroke order set work-up initiated.  900: Spoke to Dr. Wilford Corner, neurology who does not recommend code stroke at this time, will obtain  CT head and MRI brain if negative.    Glenn Gordon, PA-C 06/23/20 0901    Gerhard Munch, MD 06/23/20 [phone]

## 2020-06-23 NOTE — ED Triage Notes (Signed)
Pt reports waking up 0500 this am with his Right leg dragging, weakness to Right side extremities, lightheaded, dizziness and feels like his equilibrium is off. Pt concerned for a stroke, hx of TIA in 2011 Pt went to bed at 2130 last night with no symptoms. Grip weaker on right side, no facial droop, a/o x4, no slurred speech

## 2020-06-23 NOTE — H&P (Signed)
History and Physical    Glenn [name] ZOX:[id number] DOB: 1960-10-10 DOA: 06/23/2020  PCP: Fleet Contras, MD Consultants:  Cherly Hensen - pain management Patient coming from: Home - Lives with Rennis Chris, [phone]   Chief Complaint: R sided weakness  HPI: Glenn [name] is a 60 y.o. male with medical history significant of COPD; HTN; DM; and chronic pain presenting with R-sided weakness.  He woke "like this" 2 days ago.  He was unable to walk on Sunday after awakening - he felt like his right leg was weak and he was mildly confused.  He felt like his equilibrium was off.  He does not drink or do drugs but he used to - he felt like he was drinking, lightheaded.  He wanted to drive himself and while he was driving he noticed that things were "weird."  No dysarthria or dysphagia.  His R arm is mildly weak.   He had a mild stroke in 2010-2011 - it was similar but it didn't last Laux enough.  Because of that, he waited at home and didn't come in sooner for evaluation.   ED Course:   Stroke, likely Sunday - MRI + for CVA in posterior limb of the internal capsule.  Takes ASA.  Neurology saw him, ordering MRA.  R-sided deficits with pronator drift and +Romberg from internal capsule stroke.  Review of Systems: As per HPI; otherwise review of systems reviewed and negative.   Ambulatory Status:  Ambulates with a cane due to back arthritis  COVID Vaccine Status:  None  Past Medical History:  Diagnosis Date  . Arthritis   . Asthma   . Chronic pain 05/06/2020  . COPD (chronic obstructive pulmonary disease) (HCC)   . CVA (cerebral vascular accident) (HCC)    2010; 06/2020  . Diabetes mellitus type 2 in nonobese (HCC)   . ED (erectile dysfunction)   . Hypertension   . Tobacco dependence 05/06/2020    Past Surgical History:  Procedure Laterality Date  . LUNG SURGERY     removed tissue    Social History   Socioeconomic History  . Marital status: Married    Spouse name: Not on file  .  Number of children: Not on file  . Years of education: Not on file  . Highest education level: Not on file  Occupational History  . Occupation: disabled  Tobacco Use  . Smoking status: Former Smoker    Packs/day: 0.50    Years: 34.00    Pack years: 17.00    Types: Cigarettes    Quit date: 05/11/2020    Years since quitting: 0.1  . Smokeless tobacco: Never Used  . Tobacco comment: at the most smoked 6-7 cigarettes/day  Vaping Use  . Vaping Use: Never used  Substance and Sexual Activity  . Alcohol use: Never  . Drug use: Never  . Sexual activity: Not on file  Other Topics Concern  . Not on file  Social History Narrative  . Not on file   Social Determinants of Health   Financial Resource Strain: Not on file  Food Insecurity: Not on file  Transportation Needs: Not on file  Physical Activity: Not on file  Stress: Not on file  Social Connections: Not on file  Intimate Partner Violence: Not on file    Allergies  Allergen Reactions  . Insulins Swelling  . Ibuprofen     Abdominal sensitivity  . Shrimp [Shellfish Allergy] Itching  . Tylenol [Acetaminophen]     Abdominal sensitvity  .  Lisinopril Rash    Tongue swelling    Family History  Problem Relation Age of Onset  . Hypertension Mother   . Diabetes Maternal Grandmother   . Colon cancer Neg Hx   . Esophageal cancer Neg Hx   . Rectal cancer Neg Hx   . Inflammatory bowel disease Neg Hx   . Liver disease Neg Hx   . Pancreatic cancer Neg Hx     Prior to Admission medications   Medication Sig Start Date End Date Taking? Authorizing Provider  albuterol (PROVENTIL HFA) 108 (90 Base) MCG/ACT inhaler Inhale 2 puffs into the lungs every 4 (four) hours as needed for wheezing or shortness of breath. 11/20/18   [name], MD  albuterol (PROVENTIL) (2.5 MG/3ML) 0.083% nebulizer solution PLEASE SEE ATTACHED FOR DETAILED DIRECTIONS Patient taking differently: Take 2.5 mg by nebulization every 6 (six) hours as needed for  wheezing or shortness of breath. 02/26/18   [name], MD  AMITIZA 24 MCG capsule Take 24 mcg by mouth at bedtime. 03/31/20   [provider]  amLODipine (NORVASC) 10 MG tablet Take 10 mg by mouth daily.    [provider]  ASPIRIN LOW DOSE 81 MG EC tablet Take 81 mg by mouth daily. 04/29/19   [provider]  benzonatate (TESSALON) 100 MG capsule Take 2 capsules (200 mg total) by mouth every 8 (eight) hours as needed for cough. 06/09/20   Parrett, Virgel Bouquet, NP  budesonide-formoterol (SYMBICORT) 160-4.5 MCG/ACT inhaler Inhale 2 puffs into the lungs 2 (two) times daily. 06/09/20   Parrett, Virgel Bouquet, NP  cetirizine (ZYRTEC ALLERGY) 10 MG tablet Take 1 tablet (10 mg total) by mouth at bedtime. 06/09/20   Parrett, Virgel Bouquet, NP  ferrous sulfate 325 (65 FE) MG tablet Take 1 tablet (325 mg total) by mouth daily with breakfast. 05/10/20 06/09/20  Briant Cedar, MD  gabapentin (NEURONTIN) 300 MG capsule Take 1 capsule (300 mg total) by mouth 3 (three) times daily. 02/09/18   Eustace Moore, MD  glipiZIDE (GLUCOTROL) 5 MG tablet Take 0.5 tablets (2.5 mg total) by mouth 2 (two) times daily for 20 days. Patient not taking: Reported on 05/23/2020 04/13/20 05/03/20  Regalado, Jon Billings A, MD  hydrochlorothiazide (HYDRODIURIL) 12.5 MG tablet Take 12.5 mg by mouth daily. 03/26/20   [provider]  hydrOXYzine (ATARAX/VISTARIL) 25 MG tablet Take 25 mg by mouth 2 (two) times daily as needed for anxiety. 04/14/20   [provider]  metaxalone (SKELAXIN) 800 MG tablet Take 1 tablet (800 mg total) by mouth 3 (three) times daily. Patient taking differently: Take 800 mg by mouth 3 (three) times daily as needed for muscle spasms. 01/30/20   Rhys Martini, PA-C  metFORMIN (GLUCOPHAGE) 500 MG tablet Take 500 mg by mouth every morning. 04/17/20   [provider]  montelukast (SINGULAIR) 10 MG tablet Take 1 tablet (10 mg total) by mouth at bedtime. 06/09/20   Parrett, Virgel Bouquet, NP   Multiple Vitamin (MULTIVITAMIN WITH MINERALS) TABS tablet Take 1 tablet by mouth daily. 05/10/20   Briant Cedar, MD  nicotine (NICODERM CQ) 14 mg/24hr patch Place 1 patch (14 mg total) onto the skin daily. 06/09/20   Parrett, Virgel Bouquet, NP  nitroGLYCERIN (NITROSTAT) 0.4 MG SL tablet Place 0.4 mg under the tongue every 5 (five) minutes as needed for chest pain.    [provider]  oxyCODONE (ROXICODONE) 15 MG immediate release tablet Take 15 mg by mouth every 6 (six)  hours as needed for pain. 03/31/20   [provider]  pantoprazole (PROTONIX) 40 MG tablet Take 1 tablet (40 mg total) by mouth daily. 07/02/19   Elgergawy, Leana Roe, MD  sildenafil (VIAGRA) 100 MG tablet Take 1 tablet (100 mg total) by mouth daily as needed. Patient taking differently: Take 100 mg by mouth daily as needed for erectile dysfunction. 05/01/20     tamsulosin (FLOMAX) 0.4 MG CAPS capsule Take 0.4 mg by mouth every evening. 06/26/19   [provider]  Tiotropium Bromide Monohydrate (SPIRIVA RESPIMAT) 2.5 MCG/ACT AERS 2 puffs each am 01/15/20   [name], MD  vitamin B-12 1000 MCG tablet Take 1 tablet (1,000 mcg total) by mouth daily. 05/10/20   Briant Cedar, MD  Vitamin D, Ergocalciferol, (DRISDOL) 1.25 MG (50000 UNIT) CAPS capsule Take 50,000 Units by mouth once a week. Mondays 05/02/19   [provider]    Physical Exam: Vitals:   06/23/20 1730 06/23/20 1800 06/23/20 1830 06/23/20 1840  BP:   (!) 165/92   Pulse: (!) 107 95 (!) 129 (!) 107  Resp: (!) 28 (!) 25 (!) 22 (!) 26  Temp:      SpO2: 97% 99% (!) 88% 93%     . General:  Appears calm and comfortable and is in NAD . Eyes:  PERRL, EOMI, normal lids, iris . ENT:  grossly normal hearing, lips & tongue, mmm . Neck:  no LAD, masses or thyromegaly . Cardiovascular:  RRR, no m/r/g. No LE edema.  Marland Kitchen Respiratory:   CTA bilaterally with no wheezes/rales/rhonchi.  Normal to mildly increased respiratory effort. . Abdomen:   soft, NT, ND . Skin:  no rash or induration seen on limited exam . Musculoskeletal:  RLE < RUE weakness, 4-5/5 . Psychiatric:  grossly normal mood and affect, speech fluent and appropriate, AOx3 . Neurologic:  CN 2-12 grossly intact, moves all extremities in coordinated fashion, sensation intact    Radiological Exams on Admission: Independently reviewed - see discussion in A/P where applicable  CT Head Wo Contrast  Result Date: 06/23/2020 CLINICAL DATA:  Right side weakness EXAM: CT HEAD WITHOUT CONTRAST TECHNIQUE: Contiguous axial images were obtained from the base of the skull through the vertex without intravenous contrast. COMPARISON:  04/15/2020 FINDINGS: Brain: Chronic small vessel disease throughout the deep white matter. No acute intracranial abnormality. Specifically, no hemorrhage, hydrocephalus, mass lesion, acute infarction, or significant intracranial injury. Vascular: No hyperdense vessel or unexpected calcification. Skull: No acute calvarial abnormality. Sinuses/Orbits: No acute findings Other: None IMPRESSION: Chronic small vessel disease. No acute intracranial abnormality. Electronically Signed   By: Charlett Nose M.D.   On: 06/23/2020 09:14   MR ANGIO HEAD WO CONTRAST  Result Date: 06/23/2020 CLINICAL DATA:  Stroke, follow-up. EXAM: MRA HEAD WITHOUT CONTRAST TECHNIQUE: Angiographic images of the Circle of Willis were acquired using MRA technique without intravenous contrast. COMPARISON:  Brain MRI performed earlier today 06/23/2020. Head CT 06/23/2020. Head CT 04/15/2020. FINDINGS: Anterior circulation: The intracranial internal carotid arteries are patent. The M1 middle cerebral arteries are patent. No M2 proximal branch occlusion or high-grade proximal stenosis is identified. The anterior cerebral arteries are patent. Posterior circulation: The intracranial vertebral arteries are patent. The basilar artery is patent. The posterior cerebral arteries are patent. Hypoplastic right P1  segment with large right posterior communicating artery. The left posterior communicating artery is hypoplastic or absent. Anatomic variants: As described. IMPRESSION: Unremarkable MR angiography of the head. No intracranial large vessel occlusion or proximal high-grade  arterial stenosis. Electronically Signed   By: Jackey Loge DO   On: 06/23/2020 14:55   MR BRAIN WO CONTRAST  Result Date: 06/23/2020 CLINICAL DATA:  Neuro deficit, acute, stroke suspected. EXAM: MRI HEAD WITHOUT CONTRAST TECHNIQUE: Multiplanar, multiecho pulse sequences of the brain and surrounding structures were obtained without intravenous contrast. COMPARISON:  Head CT Jun 23, 2020 FINDINGS: Brain: Focus of restricted diffusion within the posterior limb of left internal capsule, consistent with recent infarct. No hemorrhage, hydrocephalus, extra-axial collection or mass lesion scattered and confluent foci of T2 hyperintensity are seen within the white matter of the cerebral hemispheres, nonspecific, most likely related to chronic small vessel ischemia. Vascular: Normal flow voids. Skull and upper cervical spine: Normal marrow signal. Sinuses/Orbits: Negative. Other: Mild left mastoid effusion. IMPRESSION: 1. Acute/subacute infarct within the posterior limb of the left internal capsule. 2. 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 These results were called via secure text paging at the time of interpretation on 06/23/2020 at 12:48 pm to Dr. Milon Dikes. Electronically Signed   By: Baldemar Lenis M.D.   On: 06/23/2020 12:48   VAS US CAROTID  Result Date: 06/23/2020 Carotid Arterial Duplex Study Patient Name:  Glenn [name]  Date of Exam:   06/23/2020 Medical Rec #: [id number]    Accession #:    [id number] Date of Birth: 07-10-1960    Patient Gender: M Patient Age:   59Y Exam Location:  [facility] Procedure:      VAS US CAROTID Referring Phys: [id number] Reyne Dumas [facility]  --------------------------------------------------------------------------------  Indications:       CVA. Risk Factors:      Hypertension, Diabetes. Comparison Study:  No prior study Performing Technologist: Gertie Fey MHA, RDMS, RVT, RDCS  Examination Guidelines: A complete evaluation includes B-mode imaging, spectral Doppler, color Doppler, and power Doppler as needed of all accessible portions of each vessel. Bilateral testing is considered an integral part of a complete examination. Limited examinations for reoccurring indications may be performed as noted.  Right Carotid Findings: +----------+--------+-------+--------+----------------------+------------------+           PSV cm/sEDV    StenosisPlaque Description    Comments                             cm/s                                                    +----------+--------+-------+--------+----------------------+------------------+ CCA Prox  50      16                                                      +----------+--------+-------+--------+----------------------+------------------+ CCA Distal44      18                                   intimal thickening +----------+--------+-------+--------+----------------------+------------------+ ICA Prox  33      15             smooth and homogeneous                   +----------+--------+-------+--------+----------------------+------------------+  ICA Distal82      35                                   tortuous           +----------+--------+-------+--------+----------------------+------------------+ ECA       60      11             smooth and                                                                heterogenous                             +----------+--------+-------+--------+----------------------+------------------+ +----------+--------+-------+----------------+-------------------+           PSV cm/sEDV cmsDescribe        Arm Pressure (mmHG)  +----------+--------+-------+----------------+-------------------+ [id number]             Multiphasic, WNL                    +----------+--------+-------+----------------+-------------------+ +---------+--------+--+--------+--+---------+ VertebralPSV cm/s43EDV cm/s12Antegrade +---------+--------+--+--------+--+---------+  Left Carotid Findings: +----------+--------+-------+--------+----------------------+------------------+           PSV cm/sEDV    StenosisPlaque Description    Comments                             cm/s                                                    +----------+--------+-------+--------+----------------------+------------------+ CCA Prox  68      20             smooth and                                                                heterogenous                             +----------+--------+-------+--------+----------------------+------------------+ CCA Distal48      13                                                      +----------+--------+-------+--------+----------------------+------------------+ ICA Prox  53      17                                   intimal thickening +----------+--------+-------+--------+----------------------+------------------+ ICA Distal68      26                                                      +----------+--------+-------+--------+----------------------+------------------+  ECA       52      14                                                      +----------+--------+-------+--------+----------------------+------------------+ +----------+--------+--------+----------------+-------------------+           PSV cm/sEDV cm/sDescribe        Arm Pressure (mmHG) +----------+--------+--------+----------------+-------------------+ [id number]             Multiphasic, WNL                    +----------+--------+--------+----------------+-------------------+  +---------+--------+--+--------+--+---------+ VertebralPSV cm/s36EDV cm/s15Antegrade +---------+--------+--+--------+--+---------+   Summary: Right Carotid: Velocities in the right ICA are consistent with a 1-39% stenosis. Left Carotid: Velocities in the left ICA are consistent with a 1-39% stenosis. Vertebrals:  Bilateral vertebral arteries demonstrate antegrade flow. Subclavians: Normal flow hemodynamics were seen in bilateral subclavian              arteries. *See table(s) above for measurements and observations.     Preliminary     EKG: Independently reviewed.  NSR with rate 79; nonspecific ST changes with no evidence of acute ischemia   Labs on Admission: I have personally reviewed the available labs and imaging studies at the time of the admission.  Pertinent labs:   Glucose 115 Normal CBC INR 0.9   Assessment/Plan Principal Problem:   Acute CVA (cerebrovascular accident) (HCC) Active Problems:   COPD GOLD III/ actively smoking    Hypertension   Diabetes mellitus type 2 in nonobese (HCC)   Chronic pain    CVA -Patient with R hemiparesis, lower > upper extremity -MRI confirms an internal capsule CVA -ABCD2 score is 7, high risk -tPA not given, as the patient is well outside the window -Aspirin has been given to reduce stroke mortality and decrease morbidity; however, he takes 81 mg ASA daily and so has failed primary prevention -Will admit for further CVA evaluation -Telemetry monitoring -MRI completed; MRA pending -Echo ordered -If the patient does not have known afib and this is not detected on telemetry during hospitalization, consider outpatient Holter monitoring and/or loop recorder placement. -Risk stratification with FLP, A1c; will also check UDS -Patient will need DAPT for 21 days when ABCD2 score is at least 4 and NIH score is 3 or less, and then can transition to monotherapy with a single antiplatelet agent.  Since this patient has failed primary prevention  with ASA, transition to Plavix appears to be reasonable.  Will defer to neurology for now. -Neurology consult -PT/OT/ST/Nutrition Consults -Will start empiric Lipitor 40 mg daily  HTN -Allow permissive HTN for now -Treat BP only if >220/120, and then with goal of 15% reduction -Hold Norvasc, HCTZ and plan to restart in 48-72 hours   Chronic pain -I have reviewed this patient in the [place] Controlled Substances Reporting System.  He is receiving medications from only one provider and appears to be taking them as prescribed. -Opiates were negative on his UDS, needs to be monitored closely -He is at particularly high risk of opioid misuse, diversion, or overdose.   DM -Check A1c -Hold home PO Glucophage -Will order moderate-scale SSI -Continue Neurontin  COPD -Continue home meds - Spiriva, Symbicort [facility]([place] formulary substitution), Albuterol, Singulair -Stopped smoking in early April (yay!) -Continue Nicotine patch   Note: This patient has  been tested and is negative for the novel coronavirus COVID-19. She has been fully vaccinated against COVID-19.    DVT prophylaxis:  Lovenox  Code Status: Full - confirmed with patient Family Communication: None present; I spoke with his fiancee after admission Disposition Plan:  The patient is from: home  Anticipated d/c is to: be determined, possibly CIR  Anticipated d/c date will depend on clinical response to treatment, likely 2-3 days  Patient is currently: acutely ill Consults called: Neurology; PT/OT/ST/Nutrition; TOC team Admission status: Admit - It is my clinical opinion that admission to INPATIENT is reasonable and necessary because of the expectation that this patient will require hospital care that crosses at least 2 midnights to treat this condition based on the medical complexity of the problems presented.  Given the aforementioned information, the predictability of an adverse outcome is felt to be significant.    Jonah Blue  MD [facility]   How to contact the [facility] Attending or Consulting provider 7A - 7P or covering provider during after hours 7P -7A, for this patient?  1. Check the care team in [facility] and look for a) attending/consulting [facility] provider listed and b) the [facility] team listed 2. Log into [email or web address] and use [place]'s universal password to access. If you do not have the password, please contact the hospital operator. 3. Locate the [facility] provider you are looking for under [facility] and page to a number that you can be directly reached. 4. If you still have difficulty reaching the provider, please page the [facility] (Director on Call) for the Hospitalists listed on amion for assistance.   06/23/2020, 6:45 PM

## 2020-06-23 NOTE — ED Triage Notes (Signed)
Pt endorses a Right side headache starting 2 days ago increased yesterday. Pt reports it was a gradual "foggy" headache

## 2020-06-23 NOTE — ED Notes (Signed)
Report given to Jade RN

## 2020-06-23 NOTE — Progress Notes (Signed)
Carotid artery duplex completed. Refer to "CV Proc" under chart review to view preliminary results.  06/23/2020 6:11 PM Eula Fried., MHA, RVT, RDCS, RDMS

## 2020-06-24 ENCOUNTER — Inpatient Hospital Stay (HOSPITAL_COMMUNITY): Payer: Medicaid Other

## 2020-06-24 DIAGNOSIS — I6389 Other cerebral infarction: Secondary | ICD-10-CM

## 2020-06-24 DIAGNOSIS — J449 Chronic obstructive pulmonary disease, unspecified: Secondary | ICD-10-CM

## 2020-06-24 DIAGNOSIS — E119 Type 2 diabetes mellitus without complications: Secondary | ICD-10-CM

## 2020-06-24 DIAGNOSIS — G8929 Other chronic pain: Secondary | ICD-10-CM

## 2020-06-24 LAB — ECHOCARDIOGRAM COMPLETE BUBBLE STUDY
AR max vel: 2.05 cm2
AV Area VTI: 2.19 cm2
AV Area mean vel: 2.39 cm2
AV Mean grad: 2 mmHg
AV Peak grad: 5.1 mmHg
Ao pk vel: 1.13 m/s
Area-P 1/2: 2.43 cm2
MV VTI: 2.41 cm2
S' Lateral: 2.9 cm

## 2020-06-24 LAB — GLUCOSE, CAPILLARY
Glucose-Capillary: 102 mg/dL — ABNORMAL HIGH (ref 70–99)
Glucose-Capillary: 107 mg/dL — ABNORMAL HIGH (ref 70–99)
Glucose-Capillary: 135 mg/dL — ABNORMAL HIGH (ref 70–99)
Glucose-Capillary: 138 mg/dL — ABNORMAL HIGH (ref 70–99)
Glucose-Capillary: 93 mg/dL (ref 70–99)

## 2020-06-24 LAB — LIPID PANEL
Cholesterol: 241 mg/dL — ABNORMAL HIGH (ref 0–200)
HDL: 86 mg/dL (ref >40)
LDL Cholesterol: 135 mg/dL — ABNORMAL HIGH (ref 0–99)
Total CHOL/HDL Ratio: 2.8 RATIO
Triglycerides: 98 mg/dL (ref <150)
VLDL: 20 mg/dL (ref 0–40)

## 2020-06-24 LAB — HEMOGLOBIN A1C
Hgb A1c MFr Bld: 6.8 % — ABNORMAL HIGH (ref 4.8–5.6)
Mean Plasma Glucose: 148.46 mg/dL

## 2020-06-24 MED ORDER — CLOPIDOGREL BISULFATE 75 MG PO TABS
75.0000 mg | ORAL_TABLET | Freq: Every day | ORAL | Status: DC
  Administered 2020-06-24 – 2020-06-25 (×2): 75 mg via ORAL
  Filled 2020-06-24 (×2): qty 1

## 2020-06-24 MED ORDER — ASPIRIN EC 81 MG PO TBEC
81.0000 mg | DELAYED_RELEASE_TABLET | Freq: Every day | ORAL | Status: DC
  Administered 2020-06-24 – 2020-06-25 (×2): 81 mg via ORAL
  Filled 2020-06-24 (×2): qty 1

## 2020-06-24 MED ORDER — STROKE: EARLY STAGES OF RECOVERY BOOK
Status: AC
  Filled 2020-06-24: qty 1

## 2020-06-24 NOTE — Progress Notes (Signed)
PROGRESS NOTE  [name]:[id number] DOB: 1960-12-03 DOA: 06/23/2020 PCP: Fleet Contras, MD   LOS: 1 day   Brief narrative:  Glenn [name] is a 60 y.o. male with medical history significant of COPD, esstential HTN; diabetes mellitus type 2 and chronic pain presented to the hospital with Right sided weakness with ambulatory dysfunction and mild confusion.  He felt like his equilibrium was offand his right arm was weak.  Patient does have history of mild stroke in 2011.  In the ED patient had an MRI of the brain which showed CVA in the posterior limb of the internal capsule.  Patient was then seen by neurology.  He was then admitted to hospital for further evaluation and treatment.   Assessment/Plan:  Principal Problem:   Acute CVA (cerebrovascular accident) (HCC) Active Problems:   COPD GOLD III/ actively smoking    Hypertension   Diabetes mellitus type 2 in nonobese (HCC)   Chronic pain  Acute ischemic stroke with right-sided weakness. MRI showing acute/subacute infarct in the posterior limb of the left internal capsule.  Patient presented outside the tPA window.  Continue aspirin and plavix.  Check 2D echocardiogram.  Hemoglobin A1c of 6.8.  Elevated cholesterol with a total cholesterol of 241 and LDL of 135.  Follow carotid duplex.  Neurology has been consulted.  Will follow recommendation.  Follow-up PT OT recommendations.  Continue Lipitor 40 daily.  Essential HTN Currently allowing permissive hypertension.  On Norvasc HCTZ as outpatient.  Will restart in 48 to 72 hours.   Chronic pain On oxycodone at home.  Resumed.  DM type II Continue sliding scale insulin Accu-Cheks diabetic diet.  Hemoglobin A1c noted.  COPD -Continue nicotine patch.  Stopped smoking early April.  Continue Spiriva, Symbicort, albuterol Singulair.   DVT prophylaxis: enoxaparin (LOVENOX) injection 40 mg Start: 06/23/20 1600   Code Status: Full code  Family Communication: None  Status is:  Inpatient  Remains inpatient appropriate because:IV treatments appropriate due to intensity of illness or inability to take PO and Inpatient level of care appropriate due to severity of illness   Dispo: The patient is from: Home              Anticipated d/c is to: Home              Patient currently is not medically stable to d/c.   Difficult to place patient No   Consultants:  Neurology  Procedures:  None  Anti-infectives:  . None  Anti-infectives (From admission, onward)   None      Subjective: Today, patient was seen and examined at bedside.  Patient denies any nausea, vomiting, fever, dizziness, lightheadedness or shortness of breath.  Objective: Vitals:   06/24/20 0915 06/24/20 1154  BP: (!) 135/92 (!) 139/93  Pulse: 92 90  Resp:  (!) 22  Temp: 98.6 F (37 C) 98.4 F (36.9 C)  SpO2: 100% 98%    Intake/Output Summary (Last 24 hours) at 06/24/2020 1510 Last data filed at 06/24/2020 0640 Gross per 24 hour  Intake 787.91 ml  Output --  Net 787.91 ml   There were no vitals filed for this visit. There is no height or weight on file to calculate BMI.   Physical Exam: GENERAL: Patient is alert awake and oriented. Not in obvious distress. HENT: No scleral pallor or icterus. Pupils equally reactive to light. Oral mucosa is moist NECK: is supple, no gross swelling noted. CHEST: Clear to auscultation. No crackles or wheezes.  Diminished breath sounds  bilaterally. CVS: S1 and S2 heard, no murmur. Regular rate and rhythm.  ABDOMEN: Soft, non-tender, bowel sounds are present. EXTREMITIES: No edema.  Right-sided mild weakness. CNS: Cranial nerves are intact.  Right-sided mild weakness SKIN: warm and dry without rashes.  Data Review: I have personally reviewed the following laboratory data and studies,  CBC: Recent Labs  Lab 06/23/20 0841 06/23/20 0918  WBC 6.2  --   NEUTROABS 3.7  --   HGB 13.8 14.3  HCT 42.8 42.0  MCV 90.3  --   PLT 240  --    Basic  Metabolic Panel: Recent Labs  Lab 06/23/20 0841 06/23/20 0918  NA 137 137  K 4.1 4.0  CL 102 101  CO2 28  --   GLUCOSE 115* 110*  BUN 13 15  CREATININE 0.80 0.70  CALCIUM 9.5  --    Liver Function Tests: Recent Labs  Lab 06/23/20 0841  AST 18  ALT 20  ALKPHOS 48  BILITOT 0.6  PROT 6.6  ALBUMIN 4.0   No results for input(s): LIPASE, AMYLASE in the last 168 hours. No results for input(s): AMMONIA in the last 168 hours. Cardiac Enzymes: No results for input(s): CKTOTAL, CKMB, CKMBINDEX, TROPONINI in the last 168 hours. BNP (last 3 results) Recent Labs    06/29/19 2245 05/25/20 0335  BNP 19.3 27.5    ProBNP (last 3 results) No results for input(s): PROBNP in the last 8760 hours.  CBG: Recent Labs  Lab 06/23/20 1713 06/23/20 2200 06/24/20 0004 06/24/20 0657 06/24/20 1156  GLUCAP 96 121* 135* 138* 107*   Recent Results (from the past 240 hour(s))  SARS CORONAVIRUS 2 (TAT 6-24 HRS) Nasopharyngeal Nasopharyngeal Swab     Status: None   Collection Time: 06/23/20 12:53 PM   Specimen: Nasopharyngeal Swab  Result Value Ref Range Status   SARS Coronavirus 2 NEGATIVE NEGATIVE Final    Comment: (NOTE) SARS-CoV-2 target nucleic acids are NOT DETECTED.  The SARS-CoV-2 RNA is generally detectable in upper and lower respiratory specimens during the acute phase of infection. Negative results do not preclude SARS-CoV-2 infection, do not rule out co-infections with other pathogens, and should not be used as the sole basis for treatment or other patient management decisions. Negative results must be combined with clinical observations, patient history, and epidemiological information. The expected result is Negative.  Fact Sheet for Patients: HairSlick.no  Fact Sheet for Healthcare Providers: quierodirigir.com  This test is not yet approved or cleared by the [place] FDA and  has been authorized for  detection and/or diagnosis of SARS-CoV-2 by FDA under an Emergency Use Authorization (EUA). This EUA will remain  in effect (meaning this test can be used) for the duration of the COVID-19 declaration under Se ction 564(b)(1) of the Act, 21 U.S.C. section 360bbb-3(b)(1), unless the authorization is terminated or revoked sooner.  Performed at [facility], [address], [place], [place] [address]      Studies: CT Head Wo Contrast  Result Date: 06/23/2020 CLINICAL DATA:  Right side weakness EXAM: CT HEAD WITHOUT CONTRAST TECHNIQUE: Contiguous axial images were obtained from the base of the skull through the vertex without intravenous contrast. COMPARISON:  04/15/2020 FINDINGS: Brain: Chronic small vessel disease throughout the deep white matter. No acute intracranial abnormality. Specifically, no hemorrhage, hydrocephalus, mass lesion, acute infarction, or significant intracranial injury. Vascular: No hyperdense vessel or unexpected calcification. Skull: No acute calvarial abnormality. Sinuses/Orbits: No acute findings Other: None IMPRESSION: Chronic small vessel disease. No acute  intracranial abnormality. Electronically Signed   By: Charlett NoseKevin  Dover M.D.   On: 06/23/2020 09:14   MR ANGIO HEAD WO CONTRAST  Result Date: 06/23/2020 CLINICAL DATA:  Stroke, follow-up. EXAM: MRA HEAD WITHOUT CONTRAST TECHNIQUE: Angiographic images of the Circle of Willis were acquired using MRA technique without intravenous contrast. COMPARISON:  Brain MRI performed earlier today 06/23/2020. Head CT 06/23/2020. Head CT 04/15/2020. FINDINGS: Anterior circulation: The intracranial internal carotid arteries are patent. The M1 middle cerebral arteries are patent. No M2 proximal branch occlusion or high-grade proximal stenosis is identified. The anterior cerebral arteries are patent. Posterior circulation: The intracranial vertebral arteries are patent. The basilar artery is patent. The posterior cerebral arteries are  patent. Hypoplastic right P1 segment with large right posterior communicating artery. The left posterior communicating artery is hypoplastic or absent. Anatomic variants: As described. IMPRESSION: Unremarkable MR angiography of the head. No intracranial large vessel occlusion or proximal high-grade arterial stenosis. Electronically Signed   By: Jackey LogeKyle  Golden DO   On: 06/23/2020 14:55   MR BRAIN WO CONTRAST  Result Date: 06/23/2020 CLINICAL DATA:  Neuro deficit, acute, stroke suspected. EXAM: MRI HEAD WITHOUT CONTRAST TECHNIQUE: Multiplanar, multiecho pulse sequences of the brain and surrounding structures were obtained without intravenous contrast. COMPARISON:  Head CT Jun 23, 2020 FINDINGS: Brain: Focus of restricted diffusion within the posterior limb of left internal capsule, consistent with recent infarct. No hemorrhage, hydrocephalus, extra-axial collection or mass lesion scattered and confluent foci of T2 hyperintensity are seen within the white matter of the cerebral hemispheres, nonspecific, most likely related to chronic small vessel ischemia. Vascular: Normal flow voids. Skull and upper cervical spine: Normal marrow signal. Sinuses/Orbits: Negative. Other: Mild left mastoid effusion. IMPRESSION: 1. Acute/subacute infarct within the posterior limb of the left internal capsule. 2. 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 These results were called via secure text paging at the time of interpretation on 06/23/2020 at 12:48 pm to Dr. Milon DikesAshish Arora. Electronically Signed   By: Baldemar LenisKatyucia  [place] Rodrigues M.D.   On: 06/23/2020 12:48   VAS US CAROTID  Result Date: 06/24/2020 Carotid Arterial Duplex Study Patient Name:  Amory Quintin  Date of Exam:   06/23/2020 Medical Rec #: [id number]    Accession #:    [id number] Date of Birth: 05-27-1960    Patient Gender: M Patient Age:   59Y [facility] Procedure:      VAS US CAROTID Referring Phys: [id number] Reyne DumasSTEVI W  [facility] --------------------------------------------------------------------------------  Indications:       CVA. Risk Factors:      Hypertension, Diabetes. Comparison Study:  No prior study Performing Technologist: Gertie FeyMichelle Simonetti MHA, RDMS, RVT, RDCS  Examination Guidelines: A complete evaluation includes B-mode imaging, spectral Doppler, color Doppler, and power Doppler as needed of all accessible portions of each vessel. Bilateral testing is considered an integral part of a complete examination. Limited examinations for reoccurring indications may be performed as noted.  Right Carotid Findings: +----------+--------+-------+--------+----------------------+------------------+           PSV cm/sEDV    StenosisPlaque Description    Comments                             cm/s                                                    +----------+--------+-------+--------+----------------------+------------------+  CCA Prox  50      16                                                      +----------+--------+-------+--------+----------------------+------------------+ CCA Distal44      18                                   intimal thickening +----------+--------+-------+--------+----------------------+------------------+ ICA Prox  33      15             smooth and homogeneous                   +----------+--------+-------+--------+----------------------+------------------+ ICA Distal82      35                                   tortuous           +----------+--------+-------+--------+----------------------+------------------+ ECA       60      11             smooth and                                                                heterogenous                             +----------+--------+-------+--------+----------------------+------------------+ +----------+--------+-------+----------------+-------------------+           PSV cm/sEDV cmsDescribe        Arm  Pressure (mmHG) +----------+--------+-------+----------------+-------------------+ [id number]             Multiphasic, WNL                    +----------+--------+-------+----------------+-------------------+ +---------+--------+--+--------+--+---------+ VertebralPSV cm/s43EDV cm/s12Antegrade +---------+--------+--+--------+--+---------+  Left Carotid Findings: +----------+--------+-------+--------+----------------------+------------------+           PSV cm/sEDV    StenosisPlaque Description    Comments                             cm/s                                                    +----------+--------+-------+--------+----------------------+------------------+ CCA Prox  68      20             smooth and                                                                heterogenous                             +----------+--------+-------+--------+----------------------+------------------+  CCA Distal48      13                                                      +----------+--------+-------+--------+----------------------+------------------+ ICA Prox  53      17                                   intimal thickening +----------+--------+-------+--------+----------------------+------------------+ ICA Distal68      26                                                      +----------+--------+-------+--------+----------------------+------------------+ ECA       52      14                                                      +----------+--------+-------+--------+----------------------+------------------+ +----------+--------+--------+----------------+-------------------+           PSV cm/sEDV cm/sDescribe        Arm Pressure (mmHG) +----------+--------+--------+----------------+-------------------+ [id number]             Multiphasic, WNL                    +----------+--------+--------+----------------+-------------------+  +---------+--------+--+--------+--+---------+ VertebralPSV cm/s36EDV cm/s15Antegrade +---------+--------+--+--------+--+---------+   Summary: Right Carotid: Velocities in the right ICA are consistent with a 1-39% stenosis. Left Carotid: Velocities in the left ICA are consistent with a 1-39% stenosis. Vertebrals:  Bilateral vertebral arteries demonstrate antegrade flow. Subclavians: Normal flow hemodynamics were seen in bilateral subclavian              arteries. *See table(s) above for measurements and observations.  Electronically signed by Delia Heady MD on 06/24/2020 at 8:13:30 AM.    Final       Joycelyn Das, MD  [facility] 06/24/2020  If 7PM-7AM, please contact night-coverage

## 2020-06-24 NOTE — Progress Notes (Signed)
OT evaluation:   PTA patient reports independent and driving. Admitted for below and presenting with deficits in cognition, balance, safety, and R sided weakness/decreased coordination/impaired sensation.  Patient currently requires up to min assist for mobility in room, min guard for basic transfers, and up to min assist for ADLs. Pt following simple commands but easily distracted, poor safety awareness and problem solving. Based on performance today, believe he will benefit from further OT services while admitted and after dc at CIR level to optimize independence and safety for return to independence.  Will follow.    06/24/20 1200  OT Visit Information  Last OT Received On 06/24/20  Assistance Needed +1  History of Present Illness 60 y/o male presented to ED on 5/17 with concern for gait difficulty and R sided weakness. MRI found acute/subacute infarct within posterior limb of L internal capsule. PMH: CVA in 2010, COPD, DM type 2, HTN, asthma  Precautions  Precautions Fall  Restrictions  Weight Bearing Restrictions No  Home Living  Family/patient expects to be discharged to: Private residence  Living Arrangements Spouse/significant other  Available Help at Discharge Family;Available PRN/intermittently  Type of Home Apartment  Home Access Level entry  Home Layout One level  Bathroom Shower/Tub Tub/shower unit  Tour manager None  Prior Function  Level of Independence Independent  Comments works, owns trucking Dentist No difficulties  Pain Assessment  Pain Assessment No/denies pain  Cognition  Arousal/Alertness Awake/alert  Behavior During Therapy WFL for tasks assessed/performed  Overall Cognitive Status Impaired/Different from baseline  Area of Impairment Attention;Memory;Following commands;Safety/judgement;Awareness;Problem solving  Current Attention Level Sustained  Memory Decreased recall of precautions;Decreased  short-term memory  Following Commands Follows one step commands consistently;Follows one step commands with increased time;Follows multi-step commands inconsistently  Safety/Judgement Decreased awareness of safety;Decreased awareness of deficits  Awareness Emergent  Problem Solving Requires verbal cues  General Comments pt oriented, following commands with increased time but difficulty with mulitple step commands due to decreased attention and sequencing. Pt easily distracted with decreased awareness to safety  Upper Extremity Assessment  Upper Extremity Assessment RUE deficits/detail  RUE Deficits / Details grossly 3+/5 MMT, decreased sesnation and coordination  RUE Sensation decreased light touch  RUE Coordination decreased fine motor;decreased gross motor  Lower Extremity Assessment  Lower Extremity Assessment Defer to PT evaluation  Cervical / Trunk Assessment  Cervical / Trunk Assessment Normal  ADL  Overall ADL's  Needs assistance/impaired  Grooming Min guard;Standing  Upper Body Bathing Set up;Sitting  Lower Body Bathing Min guard;Sit to/from stand  Upper Body Dressing  Set up;Sitting  Lower Body Dressing Min guard;Sit to/from stand  Lower Body Dressing Details (indicate cue type and reason) able to don socks with supervision, min guard sit to stand  Toilet Transfer Minimal assistance;Ambulation  Functional mobility during ADLs Minimal assistance;Cueing for safety  General ADL Comments pt limited by cognition, balance, R sided weakness  Vision- History  Baseline Vision/History Wears glasses  Wears Glasses Reading only  Patient Visual Report Blurring of vision  Vision- Assessment  Additional Comments appears WFL, initally reports vision WFL but then voices blurry vision once standing.  Able to read clock and signs in room. Further assessment required.  Bed Mobility  Overal bed mobility Modified Independent  General bed mobility comments no assist required  Transfers  Overall  transfer level Needs assistance  Equipment used None  Transfers Sit to/from Stand  Sit to Stand Min guard  General transfer comment min  guard for safety and balance  Balance  Overall balance assessment Mild deficits observed, not formally tested (pt with preference to UE support in standing)  General Comments  General comments (skin integrity, edema, etc.) pt reports lightheadedness during standing, VSS in sitting and standing.  On 2L upon entry, decreased to RA with SpO2 maintained 94% or greater  OT - End of Session  Equipment Utilized During Treatment Gait belt  Activity Tolerance Patient tolerated treatment well  Patient left Other (comment) (with PT upon exit)  Nurse Communication Mobility status  OT Assessment  OT Recommendation/Assessment Patient needs continued OT Services  OT Visit Diagnosis Other abnormalities of gait and mobility (R26.89);Other symptoms and signs involving cognitive function;Other symptoms and signs involving the nervous system (R29.898)  OT Problem List Decreased strength;Decreased activity tolerance;Impaired balance (sitting and/or standing);Decreased coordination;Decreased cognition;Decreased safety awareness;Decreased knowledge of use of DME or AE;Decreased knowledge of precautions;Impaired sensation;Impaired UE functional use  OT Plan  OT Frequency (ACUTE ONLY) Min 2X/week  OT Treatment/Interventions (ACUTE ONLY) Self-care/ADL training;Neuromuscular education;DME and/or AE instruction;Therapeutic activities;Cognitive remediation/compensation;Patient/family education;Balance training  AM-PAC OT "6 Clicks" Daily Activity Outcome Measure (Version 2)  Help from another person eating meals? 4  Help from another person taking care of personal grooming? 3  Help from another person toileting, which includes using toliet, bedpan, or urinal? 3  Help from another person bathing (including washing, rinsing, drying)? 3  Help from another person to put on and taking off  regular upper body clothing? 3  Help from another person to put on and taking off regular lower body clothing? 3  6 Click Score 19  OT Recommendation  Follow Up Recommendations CIR;Supervision/Assistance - 24 hour  OT Equipment Other (comment) (TBD at next venue of care)  Acute Rehab OT Goals  Patient Stated Goal home  OT Goal Formulation With patient  Time For Goal Achievement 07/08/20  Potential to Achieve Goals Good  OT Time Calculation  OT Start Time (ACUTE ONLY) 0918  OT Stop Time (ACUTE ONLY) 0944  OT Time Calculation (min) 26 min  OT General Charges  $OT Visit 1 Visit  OT Evaluation  $OT Eval Moderate Complexity 1 Mod  OT Treatments  $Self Care/Home Management  8-22 mins  Written Expression  Dominant Hand Right    Barry Brunner, OT Acute Rehabilitation Services Pager [phone] Office [phone]

## 2020-06-24 NOTE — Plan of Care (Signed)
  Problem: Education: Goal: Knowledge of General Education information will improve Description: Including pain rating scale, medication(s)/side effects and non-pharmacologic comfort measures Outcome: Progressing   Problem: Health Behavior/Discharge Planning: Goal: Ability to manage health-related needs will improve Outcome: Progressing   Problem: Clinical Measurements: Goal: Ability to maintain clinical measurements within normal limits will improve Outcome: Progressing Goal: Will remain free from infection Outcome: Progressing Goal: Diagnostic test results will improve Outcome: Progressing Goal: Respiratory complications will improve Outcome: Progressing Goal: Cardiovascular complication will be avoided Outcome: Progressing   Problem: Activity: Goal: Risk for activity intolerance will decrease Outcome: Progressing   Problem: Nutrition: Goal: Adequate nutrition will be maintained Outcome: Progressing   Problem: Coping: Goal: Level of anxiety will decrease Outcome: Progressing   Problem: Elimination: Goal: Will not experience complications related to bowel motility Outcome: Progressing Goal: Will not experience complications related to urinary retention Outcome: Progressing   Problem: Pain Managment: Goal: General experience of comfort will improve Outcome: Progressing   Problem: Safety: Goal: Ability to remain free from injury will improve Outcome: Progressing   Problem: Skin Integrity: Goal: Risk for impaired skin integrity will decrease Outcome: Progressing   Problem: Education: Goal: Knowledge of disease or condition will improve Outcome: Progressing Goal: Knowledge of secondary prevention will improve Outcome: Progressing Goal: Knowledge of patient specific risk factors addressed and post discharge goals established will improve Outcome: Progressing   Problem: Coping: Goal: Will verbalize positive feelings about self Outcome: Progressing   Problem:  Self-Care: Goal: Ability to participate in self-care as condition permits will improve Outcome: Progressing   Problem: Nutrition: Goal: Risk of aspiration will decrease Outcome: Progressing

## 2020-06-24 NOTE — Progress Notes (Addendum)
STROKE TEAM PROGRESS NOTE   INTERVAL HISTORY  60yoM with a history of HTN, DM, tobacco use who presented to the ED with complaints of right arm and right lower extremity weakness.  He states that he was in his normal state of health when he went to bed approximately 2100 but when he woke up he noticed his right sided weakness.  He tried to walk but he kept leaning to the right side.  EMS was called and he was transferred to the ED.  MRI showed acute/subacute infarct of the left internal capsule.  Vitals:   06/24/20 0443 06/24/20 0742 06/24/20 0915 06/24/20 1154  BP:   (!) 135/92 (!) 139/93  Pulse:   92 90  Resp:    (!) 22  Temp:   98.6 F (37 C) 98.4 F (36.9 C)  TempSrc:   Oral Oral  SpO2: 98% 98% 100% 98%   CBC:  Recent Labs  Lab 06/23/20 0841 06/23/20 0918  WBC 6.2  --   NEUTROABS 3.7  --   HGB 13.8 14.3  HCT 42.8 42.0  MCV 90.3  --   PLT 240  --    Basic Metabolic Panel:  Recent Labs  Lab 06/23/20 0841 06/23/20 0918  NA 137 137  K 4.1 4.0  CL 102 101  CO2 28  --   GLUCOSE 115* 110*  BUN 13 15  CREATININE 0.80 0.70  CALCIUM 9.5  --    Lipid Panel:  Recent Labs  Lab 06/24/20 0303  CHOL 241*  TRIG 98  HDL 86  CHOLHDL 2.8  VLDL 20  LDLCALC 315*   HgbA1c:  Recent Labs  Lab 06/24/20 0303  HGBA1C 6.8*   Urine Drug Screen:  Recent Labs  Lab 06/23/20 1729  LABOPIA NONE DETECTED  COCAINSCRNUR NONE DETECTED  LABBENZ POSITIVE*  AMPHETMU NONE DETECTED  THCU NONE DETECTED  LABBARB NONE DETECTED    Alcohol Level No results for input(s): ETH in the last 168 hours.  IMAGING   CT Head Wo Contrast Result Date: 06/23/2020 IMPRESSION: Chronic small vessel disease. No acute intracranial abnormality.   MR ANGIO HEAD WO CONTRAST Result Date: 06/23/2020 IMPRESSION: Unremarkable MR angiography of the head. No intracranial large vessel occlusion or proximal high-grade arterial stenosis.   MR BRAIN WO CONTRAST Result Date: 06/23/2020  IMPRESSION:  1.  Acute/subacute infarct within the posterior limb of the left internal capsule.  2. 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    Carotid Arterial Duplex Study Result Date: 06/23/2020 Summary: Right Carotid:  Velocities in the right ICA are consistent with a 1-39% stenosis.  Left Carotid: Velocities in the left ICA are consistent with a 1-39% stenosis.  Vertebrals:  Bilateral vertebral arteries demonstrate antegrade flow.  Subclavians: Normal flow hemodynamics were seen in bilateral subclavian              arteries.    PHYSICAL EXAM GENERAL: Awake, alert, sitting up in bed, in no acute distress Psych: Affect appropriate for situation, patient calm and cooperative with examination Head: Normocephalic and atraumatic EENT: Arcus senilis present bilaterally, normal conjunctivae, wears eyeglasses at baseline LUNGS: Normal respiratory effort. Non-labored breathing CV: Regular rate on tele ABDOMEN: Soft, non-distended Ext: without obvious deformity, cool to touch  NEURO:  Mental Status: Awake, alert, and oriented to person, place, time, and situation. He is able to correctly state the month, year, and his age. He is able to provide a clear and coherent history of present illness.  Speech  is intact without dysarthria. Naming, repetition, fluency, and comprehension intact. No aphasia or neglect noted. Cranial Nerves:  II: PERRL 2 mm/brisk. Visual fields full.  III, IV, VI: EOMI without ptosis  V: Sensation is intact to light touch and symmetrical to face.   VII: mild facial weakness on the left VIII: Hearing is intact to voice IX, X: Palate elevation is symmetric. Phonation normal.  XI: Normal sternocleidomastoid and trapezius muscle strength XII: Tongue protrudes midline without fasciculations.   Motor: 5/5 strength present in left upper and lower extremities without drift, 4/5 strength present in right upper and lower extremities with vertical drift on  assessment. Tone is normal. Bulk is normal.  Sensation: Intact to light touch bilaterally in all four extremities. No extinction to DSS present.  Coordination: FTN intact bilaterally. HKS intact on left lower extremity, right lower extremity weak but without ataxia.  Gait: Deferred  ASSESSMENT/PLAN Mr. Mareo Portilla is a 60 y.o. male with history of hypertension, type 2 diabetes mellitus, and tobacco use who presented to the ED for evaluation of right arm and lower extremity weakness. No tPA given as he was outside of the thrombolytic therapy window.  No LVO on MR.   Stroke: acute/subacute infarct within the posterior limb of the left internal capsule likely small vessel disease  CT head: chronic small vessel disease.   MRI:  acute/subacute infarct within the posterior limb of the left internal capsule.        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   MRA Unremarkable, no LVO  Carotid Doppler: 1-39% stenosis  2D Echo pending  LDL 135  HgbA1c 6.8  VTE prophylaxis - SCDs  Diet: heart healthy/carb modified  aspirin 81 mg daily prior to admission, now on aspirin 81 mg daily and clopidogrel 75 mg daily. DAPT with aspirin and plavix for 3 weeks then plavix alone.  Therapy recommendations:  pending  Disposition:  pending  Hypertension  Home meds:  Amlodipine 10mg  daily, hydrochlorothiazide 12.5 daily  Stable . Permissive hypertension (OK if < 220/120) but gradually normalize in 5-7 days . Talarico-term BP goal normotensive  Hyperlipidemia  Home meds: none  LDL 135, goal < 70  Add atorvastatin 40mg  daily  Continue statin at discharge  Diabetes type II Controlled  Home meds:  Glipizide 2.5mg  bid, metformin 500mg  daily  HgbA1c 6.8, goal < 7.0  CBGs  SSI  Tobacco abuse  Quit smoking 6 weeks ago.  He has a 17 pack-year history  ? On Nicotine patch  Continued Smoking cessation counseling provided  Other Stroke Risk Factors  Hx  stroke: 2010  COPD  Other Active Problems  Arthritis: metaxalone 800mg  tid  Asthma: albuterol inhalers, symbicort, spiriva  Chronic pain: Neurontin 300mg  tid, oxycodone prn  Seasonal allergies: zyrtec  Anxiety: hydroxyzine  Gerd: protonix  Hospital day # 1  Lissy Olivencia-Simmons ACNP-BC Stroke NP  ATTENDING NOTE: I reviewed above note and agree with the assessment and plan. Pt was seen and examined.   60 year old male with history of hypertension, diabetes, former smoker admitted for right arm leg weakness.  CT no acute epilepsy.  MRI showed left posterior limb internal capsule infarct.  MRA negative.  Carotid Doppler negative.  2D echo pending.  A1c 6.8, LDL 135.  UDS positive for benzo.  Creatinine 0.70  On exam, patient awake alert, orientated x3.  No aphasia, fluent language, no dysarthria, follows simple commands.  No gaze palsy, visual field full, mild right nasolabial fold flattening.  Right upper  extremity pronator drift, right lower extremity proximal 4+/5, distal 5/5.  Left upper and lower extremity 5/5.  Right finger-to-nose dysmetria.  Sensation symmetrical.  Gait not tested.  Etiology for patient stroke likely small vessel disease given risk factors and location.  Continue aspirin 81 and Plavix 75 DAPT for 3 weeks and then Plavix alone.  Continue Lipitor 40.  Patient has quit smoking 2 months ago.  Continued abstinence from smoking education provided.  PT/OT recommend CIR.  For detailed assessment and plan, please refer to above as I have made changes wherever appropriate.   Neurology will sign off. Please call with questions. Pt will follow up with stroke clinic NP at [facility] in about 4 weeks. Thanks for the consult.   Marvel Plan, MD PhD Stroke Neurology 06/24/2020 5:16 PM     To contact Stroke Continuity provider, please refer to [email or web address]. After hours, contact General Neurology

## 2020-06-24 NOTE — Plan of Care (Signed)
Problem: Education: Goal: Knowledge of General Education information will improve Description: Including pain rating scale, medication(s)/side effects and non-pharmacologic comfort measures 06/24/2020 1854 by Mamie Nick I, RN Outcome: Progressing 06/24/2020 1853 by Mamie Nick I, RN Outcome: Progressing 06/24/2020 1119 by Mamie Nick I, RN Outcome: Progressing   Problem: Health Behavior/Discharge Planning: Goal: Ability to manage health-related needs will improve 06/24/2020 1854 by Mamie Nick I, RN Outcome: Progressing 06/24/2020 1853 by Mamie Nick I, RN Outcome: Progressing 06/24/2020 1119 by Mamie Nick I, RN Outcome: Progressing   Problem: Clinical Measurements: Goal: Ability to maintain clinical measurements within normal limits will improve 06/24/2020 1854 by Mamie Nick I, RN Outcome: Progressing 06/24/2020 1853 by Mamie Nick I, RN Outcome: Progressing 06/24/2020 1119 by Mamie Nick I, RN Outcome: Progressing Goal: Will remain free from infection 06/24/2020 1854 by Mamie Nick I, RN Outcome: Progressing 06/24/2020 1853 by Mamie Nick I, RN Outcome: Progressing 06/24/2020 1119 by Mamie Nick I, RN Outcome: Progressing Goal: Diagnostic test results will improve 06/24/2020 1854 by Mamie Nick I, RN Outcome: Progressing 06/24/2020 1853 by Mamie Nick I, RN Outcome: Progressing 06/24/2020 1119 by Mamie Nick I, RN Outcome: Progressing Goal: Respiratory complications will improve 06/24/2020 1854 by Mamie Nick I, RN Outcome: Progressing 06/24/2020 1853 by Mamie Nick I, RN Outcome: Progressing 06/24/2020 1119 by Mamie Nick I, RN Outcome: Progressing Goal: Cardiovascular complication will be avoided 06/24/2020 1854 by Mamie Nick I, RN Outcome: Progressing 06/24/2020 1853 by Mamie Nick I, RN Outcome: Progressing 06/24/2020 1119 by Mamie Nick I,  RN Outcome: Progressing   Problem: Activity: Goal: Risk for activity intolerance will decrease 06/24/2020 1854 by Mamie Nick I, RN Outcome: Progressing 06/24/2020 1853 by Mamie Nick I, RN Outcome: Progressing 06/24/2020 1119 by Mamie Nick I, RN Outcome: Progressing   Problem: Nutrition: Goal: Adequate nutrition will be maintained 06/24/2020 1854 by Mamie Nick I, RN Outcome: Progressing 06/24/2020 1853 by Mamie Nick I, RN Outcome: Progressing 06/24/2020 1119 by Mamie Nick I, RN Outcome: Progressing   Problem: Coping: Goal: Level of anxiety will decrease 06/24/2020 1854 by Mamie Nick I, RN Outcome: Progressing 06/24/2020 1853 by Mamie Nick I, RN Outcome: Progressing 06/24/2020 1119 by Mamie Nick I, RN Outcome: Progressing   Problem: Elimination: Goal: Will not experience complications related to bowel motility 06/24/2020 1854 by Mamie Nick I, RN Outcome: Progressing 06/24/2020 1853 by Mamie Nick I, RN Outcome: Progressing 06/24/2020 1119 by Mamie Nick I, RN Outcome: Progressing Goal: Will not experience complications related to urinary retention 06/24/2020 1854 by Mamie Nick I, RN Outcome: Progressing 06/24/2020 1853 by Mamie Nick I, RN Outcome: Progressing 06/24/2020 1119 by Mamie Nick I, RN Outcome: Progressing   Problem: Pain Managment: Goal: General experience of comfort will improve 06/24/2020 1854 by Mamie Nick I, RN Outcome: Progressing 06/24/2020 1853 by Mamie Nick I, RN Outcome: Progressing 06/24/2020 1119 by Mamie Nick I, RN Outcome: Progressing   Problem: Safety: Goal: Ability to remain free from injury will improve 06/24/2020 1854 by Mamie Nick I, RN Outcome: Progressing 06/24/2020 1853 by Mamie Nick I, RN Outcome: Progressing 06/24/2020 1119 by Mamie Nick I, RN Outcome: Progressing   Problem: Skin Integrity: Goal:  Risk for impaired skin integrity will decrease 06/24/2020 1854 by Mamie Nick I, RN Outcome: Progressing 06/24/2020 1853 by Mamie Nick I, RN Outcome: Progressing 06/24/2020 1119 by Mamie Nick I, RN Outcome: Progressing   Problem: Education: Goal: Knowledge of disease or condition will improve 06/24/2020 1854 by Mamie Nick I, RN Outcome: Progressing 06/24/2020 1853 by Karie Soda, RN  Outcome: Progressing 06/24/2020 1119 by Mamie Nick I, RN Outcome: Progressing Goal: Knowledge of secondary prevention will improve 06/24/2020 1854 by Mamie Nick I, RN Outcome: Progressing 06/24/2020 1853 by Mamie Nick I, RN Outcome: Progressing 06/24/2020 1119 by Mamie Nick I, RN Outcome: Progressing Goal: Knowledge of patient specific risk factors addressed and post discharge goals established will improve 06/24/2020 1854 by Mamie Nick I, RN Outcome: Progressing 06/24/2020 1853 by Mamie Nick I, RN Outcome: Progressing 06/24/2020 1119 by Mamie Nick I, RN Outcome: Progressing   Problem: Coping: Goal: Will verbalize positive feelings about self 06/24/2020 1854 by Mamie Nick I, RN Outcome: Progressing 06/24/2020 1853 by Mamie Nick I, RN Outcome: Progressing 06/24/2020 1119 by Mamie Nick I, RN Outcome: Progressing   Problem: Self-Care: Goal: Ability to participate in self-care as condition permits will improve 06/24/2020 1854 by Mamie Nick I, RN Outcome: Progressing 06/24/2020 1853 by Mamie Nick I, RN Outcome: Progressing 06/24/2020 1119 by Mamie Nick I, RN Outcome: Progressing   Problem: Nutrition: Goal: Risk of aspiration will decrease 06/24/2020 1854 by Mamie Nick I, RN Outcome: Progressing 06/24/2020 1853 by Mamie Nick I, RN Outcome: Progressing 06/24/2020 1119 by Mamie Nick I, RN Outcome: Progressing   Problem: Education: Goal: Knowledge of disease or  condition will improve Outcome: Progressing Goal: Knowledge of secondary prevention will improve Outcome: Progressing

## 2020-06-24 NOTE — Progress Notes (Signed)
Rehab Admissions Coordinator Note:  Patient was screened by Clois Dupes for appropriateness for an Inpatient Acute Rehab Consult per OT recs..  At this time, we are recommending Inpatient Rehab consult. I will place order per protocol.  Clois Dupes RN MSN 06/24/2020, 4:20 PM  I can be reached at [phone].

## 2020-06-24 NOTE — Progress Notes (Signed)
  Echocardiogram 2D Echocardiogram has been performed.  Glenn [name] 06/24/2020, 3:52 PM
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
## 2020-06-24 NOTE — Plan of Care (Signed)
Problem: Education: Goal: Knowledge of General Education information will improve Description: Including pain rating scale, medication(s)/side effects and non-pharmacologic comfort measures 06/24/2020 1853 by Mamie Nick I, RN Outcome: Progressing 06/24/2020 1119 by Mamie Nick I, RN Outcome: Progressing   Problem: Health Behavior/Discharge Planning: Goal: Ability to manage health-related needs will improve 06/24/2020 1853 by Mamie Nick I, RN Outcome: Progressing 06/24/2020 1119 by Mamie Nick I, RN Outcome: Progressing   Problem: Clinical Measurements: Goal: Ability to maintain clinical measurements within normal limits will improve 06/24/2020 1853 by Mamie Nick I, RN Outcome: Progressing 06/24/2020 1119 by Mamie Nick I, RN Outcome: Progressing Goal: Will remain free from infection 06/24/2020 1853 by Mamie Nick I, RN Outcome: Progressing 06/24/2020 1119 by Mamie Nick I, RN Outcome: Progressing Goal: Diagnostic test results will improve 06/24/2020 1853 by Mamie Nick I, RN Outcome: Progressing 06/24/2020 1119 by Mamie Nick I, RN Outcome: Progressing Goal: Respiratory complications will improve 06/24/2020 1853 by Mamie Nick I, RN Outcome: Progressing 06/24/2020 1119 by Mamie Nick I, RN Outcome: Progressing Goal: Cardiovascular complication will be avoided 06/24/2020 1853 by Mamie Nick I, RN Outcome: Progressing 06/24/2020 1119 by Mamie Nick I, RN Outcome: Progressing   Problem: Activity: Goal: Risk for activity intolerance will decrease 06/24/2020 1853 by Mamie Nick I, RN Outcome: Progressing 06/24/2020 1119 by Mamie Nick I, RN Outcome: Progressing   Problem: Nutrition: Goal: Adequate nutrition will be maintained 06/24/2020 1853 by Mamie Nick I, RN Outcome: Progressing 06/24/2020 1119 by Mamie Nick I, RN Outcome: Progressing   Problem: Coping: Goal: Level  of anxiety will decrease 06/24/2020 1853 by Mamie Nick I, RN Outcome: Progressing 06/24/2020 1119 by Mamie Nick I, RN Outcome: Progressing   Problem: Elimination: Goal: Will not experience complications related to bowel motility 06/24/2020 1853 by Mamie Nick I, RN Outcome: Progressing 06/24/2020 1119 by Mamie Nick I, RN Outcome: Progressing Goal: Will not experience complications related to urinary retention 06/24/2020 1853 by Mamie Nick I, RN Outcome: Progressing 06/24/2020 1119 by Mamie Nick I, RN Outcome: Progressing   Problem: Pain Managment: Goal: General experience of comfort will improve 06/24/2020 1853 by Mamie Nick I, RN Outcome: Progressing 06/24/2020 1119 by Mamie Nick I, RN Outcome: Progressing   Problem: Safety: Goal: Ability to remain free from injury will improve 06/24/2020 1853 by Mamie Nick I, RN Outcome: Progressing 06/24/2020 1119 by Mamie Nick I, RN Outcome: Progressing   Problem: Skin Integrity: Goal: Risk for impaired skin integrity will decrease 06/24/2020 1853 by Mamie Nick I, RN Outcome: Progressing 06/24/2020 1119 by Mamie Nick I, RN Outcome: Progressing   Problem: Education: Goal: Knowledge of disease or condition will improve 06/24/2020 1853 by Mamie Nick I, RN Outcome: Progressing 06/24/2020 1119 by Mamie Nick I, RN Outcome: Progressing Goal: Knowledge of secondary prevention will improve 06/24/2020 1853 by Mamie Nick I, RN Outcome: Progressing 06/24/2020 1119 by Mamie Nick I, RN Outcome: Progressing Goal: Knowledge of patient specific risk factors addressed and post discharge goals established will improve 06/24/2020 1853 by Mamie Nick I, RN Outcome: Progressing 06/24/2020 1119 by Mamie Nick I, RN Outcome: Progressing   Problem: Coping: Goal: Will verbalize positive feelings about self 06/24/2020 1853 by Mamie Nick I,  RN Outcome: Progressing 06/24/2020 1119 by Mamie Nick I, RN Outcome: Progressing   Problem: Self-Care: Goal: Ability to participate in self-care as condition permits will improve 06/24/2020 1853 by Mamie Nick I, RN Outcome: Progressing 06/24/2020 1119 by Mamie Nick I, RN Outcome: Progressing   Problem: Nutrition: Goal: Risk of aspiration will decrease 06/24/2020 1853 by  Mamie Nick I, RN Outcome: Progressing 06/24/2020 1119 by Mamie Nick I, RN Outcome: Progressing

## 2020-06-24 NOTE — Evaluation (Signed)
Physical Therapy Evaluation Patient Details Name: Glenn [name] MRN: [id number] DOB: Aug 20, 1960 Today's Date: 06/24/2020   History of Present Illness  60 y/o male presented to ED on 5/17 with concern for gait difficulty and R sided weakness. MRI found acute/subacute infarct within posterior limb of L internal capsule. PMH: CVA in 2010, COPD, DM type 2, HTN, asthma  Clinical Impression  PTA, patient lives with wife and reports independence with mobility. Patient functioning at [facility] level for OOB mobility with no AD. Patient with difficulty following commands and easily distracted. Patient with poor safety awareness and required extensive education about fall risk and not ambulating in room without assistance from nursing staff. Patient presents with impaired balance, impaired cognition, R sided weakness, ataxia, and impaired sensation. Patient will benefit from skilled PT services during acute stay to address listed deficits. Recommend CIR following discharge to maximize independence and assist with return to PLOF.     Follow Up Recommendations CIR    Equipment Recommendations  Other (comment) (TBD pending progress)    Recommendations for Other Services       Precautions / Restrictions Precautions Precautions: Fall Restrictions Weight Bearing Restrictions: No      Mobility  Bed Mobility               General bed mobility comments: sitting EOB on arrival with OT    Transfers Overall transfer level: Needs assistance Equipment used: None Transfers: Sit to/from Stand Sit to Stand: Min guard         General transfer comment: min guard for safety and balance  Ambulation/Gait Ambulation/Gait assistance: Min assist Gait Distance (Feet): 200 Feet Assistive device: None Gait Pattern/deviations: Step-through pattern;Decreased stride length;Decreased dorsiflexion - right;Decreased weight shift to right;Ataxic;Drifts right/left;Wide base of support Gait velocity: decreased    General Gait Details: drifting L/R throughout. MinA for balance as patient staggering at times due to R LE weakness. R genu recurvatum noted during stance phase. Multimodal cues to limit hyperextension with good follow through.  Stairs            Wheelchair Mobility    Modified Rankin (Stroke Patients Only) Modified Rankin (Stroke Patients Only) Pre-Morbid Rankin Score: No symptoms Modified Rankin: Moderately severe disability     Balance Overall balance assessment: Mild deficits observed, not formally tested                                           Pertinent Vitals/Pain Pain Assessment: No/denies pain Faces Pain Scale: No hurt    Home Living Family/patient expects to be discharged to:: Private residence Living Arrangements: Spouse/significant other Available Help at Discharge: Family;Available PRN/intermittently Type of Home: Apartment Home Access: Level entry     Home Layout: One level Home Equipment: None      Prior Function Level of Independence: Independent         Comments: works, owns trucking [employer]   Dominant Hand: Right    Extremity/Trunk Assessment   Upper Extremity Assessment Upper Extremity Assessment: Defer to OT evaluation    Lower Extremity Assessment Lower Extremity Assessment: RLE deficits/detail RLE Deficits / Details: Grossly 4/5 functionally. Hyperextension noted during stance phase on R RLE Sensation: decreased light touch    Cervical / Trunk Assessment Cervical / Trunk Assessment: Normal  Communication   Communication: No difficulties  Cognition Arousal/Alertness: Awake/alert Behavior During Therapy: WFL for tasks  assessed/performed Overall Cognitive Status: Impaired/Different from baseline Area of Impairment: Attention;Memory;Following commands;Safety/judgement;Awareness;Problem solving                   Current Attention Level: Sustained Memory: Decreased recall of  precautions;Decreased short-term memory Following Commands: Follows one step commands consistently;Follows one step commands with increased time;Follows multi-step commands inconsistently Safety/Judgement: Decreased awareness of safety;Decreased awareness of deficits Awareness: Emergent Problem Solving: Requires verbal cues General Comments: Following commands with increased time but difficulty following multi step commands. Decreased awareness of safety. Uses humor at times to hide deficits.      General Comments      Exercises     Assessment/Plan    PT Assessment Patient needs continued PT services  PT Problem List Decreased strength;Decreased activity tolerance;Decreased balance;Decreased mobility;Decreased cognition;Decreased safety awareness;Impaired sensation       PT Treatment Interventions DME instruction;Gait training;Functional mobility training;Therapeutic activities;Therapeutic exercise;Balance training;Neuromuscular re-education;Patient/family education    PT Goals (Current goals can be found in the Care Plan section)  Acute Rehab PT Goals Patient Stated Goal: home PT Goal Formulation: With patient Time For Goal Achievement: 07/08/20 Potential to Achieve Goals: Good Additional Goals Additional Goal #1: Patient will perform dynamic standing balance with no UE support and modI with no LOB    Frequency Min 4X/week   Barriers to discharge        Co-evaluation               AM-PAC PT "6 Clicks" Mobility  Outcome Measure Help needed turning from your back to your side while in a flat bed without using bedrails?: None Help needed moving from lying on your back to sitting on the side of a flat bed without using bedrails?: None Help needed moving to and from a bed to a chair (including a wheelchair)?: A Little Help needed standing up from a chair using your arms (e.g., wheelchair or bedside chair)?: A Little Help needed to walk in hospital room?: A Little Help  needed climbing 3-5 steps with a railing? : A Little 6 Click Score: 20    End of Session Equipment Utilized During Treatment: Gait belt Activity Tolerance: Patient tolerated treatment well Patient left: in chair;with call bell/phone within reach;with chair alarm set Nurse Communication: Mobility status PT Visit Diagnosis: Unsteadiness on feet (R26.81);Muscle weakness (generalized) (M62.81);History of falling (Z91.81);Ataxic gait (R26.0)    Time: 8416-6063 PT Time Calculation (min) (ACUTE ONLY): 24 min   Charges:   PT Evaluation $PT Eval Low Complexity: 1 Low PT Treatments $Therapeutic Activity: 8-22 mins        Lalia Loudon A. Dan Humphreys PT, DPT Acute Rehabilitation Services Pager [phone] Office [phone]   Viviann Spare 06/24/2020, 4:28 PM

## 2020-06-25 ENCOUNTER — Emergency Department (HOSPITAL_COMMUNITY)
Admission: EM | Admit: 2020-06-25 | Discharge: 2020-06-25 | Disposition: A | Discharge status: Home / Self Care | Payer: Medicaid Other | Attending: Emergency Medicine | Admitting: Emergency Medicine

## 2020-06-25 ENCOUNTER — Encounter (HOSPITAL_COMMUNITY): Payer: Self-pay | Admitting: Emergency Medicine

## 2020-06-25 ENCOUNTER — Other Ambulatory Visit: Payer: Self-pay

## 2020-06-25 DIAGNOSIS — N6310 Unspecified lump in the right breast, unspecified quadrant: Secondary | ICD-10-CM | POA: Diagnosis not present

## 2020-06-25 DIAGNOSIS — Z5321 Procedure and treatment not carried out due to patient leaving prior to being seen by health care provider: Secondary | ICD-10-CM | POA: Insufficient documentation

## 2020-06-25 LAB — GLUCOSE, CAPILLARY
Glucose-Capillary: 110 mg/dL — ABNORMAL HIGH (ref 70–99)
Glucose-Capillary: 160 mg/dL — ABNORMAL HIGH (ref 70–99)
Glucose-Capillary: 101 mg/dL — ABNORMAL HIGH (ref 70–99)

## 2020-06-25 MED ORDER — ATORVASTATIN CALCIUM 40 MG PO TABS: 40.0000 mg | ORAL_TABLET | Freq: Every day | ORAL | 2 refills | Status: DC

## 2020-06-25 MED ORDER — ADULT MULTIVITAMIN W/MINERALS CH
1.0000 | ORAL_TABLET | Freq: Every day | ORAL | Status: DC
  Administered 2020-06-25: 1 via ORAL
  Filled 2020-06-25: qty 1

## 2020-06-25 MED ORDER — CLOPIDOGREL BISULFATE 75 MG PO TABS: 75.0000 mg | ORAL_TABLET | Freq: Every day | ORAL | 3 refills | Status: AC

## 2020-06-25 MED ORDER — GLUCERNA SHAKE PO LIQD
237.0000 mL | Freq: Three times a day (TID) | ORAL | Status: DC
  Administered 2020-06-25: 237 mL via ORAL
  Filled 2020-06-25: qty 237

## 2020-06-25 MED ORDER — PROSOURCE PLUS PO LIQD
30.0000 mL | Freq: Two times a day (BID) | ORAL | Status: DC
  Administered 2020-06-25: 30 mL via ORAL
  Filled 2020-06-25: qty 30

## 2020-06-25 MED ORDER — OXYCODONE HCL 5 MG PO TABS
20.0000 mg | ORAL_TABLET | Freq: Four times a day (QID) | ORAL | Status: DC | PRN
  Administered 2020-06-25: 20 mg via ORAL
  Filled 2020-06-25: qty 4

## 2020-06-25 MED ORDER — ASPIRIN LOW DOSE 81 MG PO TBEC: 81.0000 mg | DELAYED_RELEASE_TABLET | Freq: Every day | ORAL | 0 refills | Status: AC

## 2020-06-25 NOTE — Consult Note (Incomplete)
Physical Medicine and Rehabilitation Consult Reason for Consult: Right side weakness with gait disorder Referring Physician: Dr.Pokhrel   HPI: Glenn [name] is a 60 y.o. right-handed male with history of COPD/tobacco use, hypertension, diabetes mellitus, CVA 2010 maintained on aspirin.  Per chart review lives with spouse independent prior to admission.  1 level apartment.  Presented 06/23/2020 with right side weakness x2 days as well as mild altered mental status changes.  Cranial CT scan negative for acute changes.  Patient did not receive tPA.  MRI/MRI showed acute subacute infarct within the posterior limb of the left internal capsule.  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  Admission chemistries unremarkable except glucose 115, urine drug screen positive benzos, hemoglobin A1c 6.8.  Echocardiogram with ejection fraction of 55 to 60% no wall motion abnormalities grade 1 diastolic dysfunction.  Carotid Dopplers with 1-39% stenosis.  Currently maintained on aspirin 81 mg daily and Plavix 75 mg daily for CVA prophylaxis x3 weeks then Plavix alone.  Subcutaneous Lovenox for DVT prophylaxis.  Therapy evaluations completed due to patient's right-sided weakness recommendations of physical medicine rehab consult.   Review of Systems  Constitutional: Negative for chills and fever.  HENT: Negative for hearing loss.   Eyes: Negative for blurred vision and double vision.  Respiratory: Negative for cough.        Occasional shortness of breath with heavy exertion  Cardiovascular: Negative for chest pain, palpitations and leg swelling.  Gastrointestinal: Positive for constipation. Negative for heartburn, nausea and vomiting.  Genitourinary: Negative for dysuria, flank pain and hematuria.  Musculoskeletal: Positive for back pain, joint pain and myalgias.  Skin: Negative for rash.  Neurological: Positive for weakness.  All other systems reviewed and are  negative.  Past Medical History:  Diagnosis Date  . Arthritis   . Asthma   . Chronic pain 05/06/2020  . COPD (chronic obstructive pulmonary disease) (HCC)   . CVA (cerebral vascular accident) (HCC)    2010; 06/2020  . Diabetes mellitus type 2 in nonobese (HCC)   . ED (erectile dysfunction)   . Hypertension   . Tobacco dependence 05/06/2020   Past Surgical History:  Procedure Laterality Date  . LUNG SURGERY     removed tissue   Family History  Problem Relation Age of Onset  . Hypertension Mother   . Diabetes Maternal Grandmother   . Colon cancer Neg Hx   . Esophageal cancer Neg Hx   . Rectal cancer Neg Hx   . Inflammatory bowel disease Neg Hx   . Liver disease Neg Hx   . Pancreatic cancer Neg Hx    Social History:  reports that he quit smoking about 6 weeks ago. His smoking use included cigarettes. He has a 17.00 pack-year smoking history. He has never used smokeless tobacco. He reports that he does not drink alcohol and does not use drugs. Allergies:  Allergies  Allergen Reactions  . Insulins Swelling  . Ibuprofen     Abdominal sensitivity  . Shrimp [Shellfish Allergy] Itching  . Tylenol [Acetaminophen]     Abdominal sensitvity  . Lisinopril Rash    Tongue swelling   Medications Prior to Admission  Medication Sig Dispense Refill  . albuterol (PROVENTIL HFA) 108 (90 Base) MCG/ACT inhaler Inhale 2 puffs into the lungs every 4 (four) hours as needed for wheezing or shortness of breath. 1 g 11  . albuterol (PROVENTIL) (2.5 MG/3ML) 0.083% nebulizer solution PLEASE SEE ATTACHED FOR DETAILED DIRECTIONS (Patient taking differently:  Take 2.5 mg by nebulization every 6 (six) hours as needed for wheezing or shortness of breath.) 150 mL 2  . AMITIZA 24 MCG capsule Take 24 mcg by mouth at bedtime.    Marland Kitchen amLODipine (NORVASC) 10 MG tablet Take 10 mg by mouth daily.    . ASPIRIN LOW DOSE 81 MG EC tablet Take 81 mg by mouth daily.    . benzonatate (TESSALON) 100 MG capsule Take 2  capsules (200 mg total) by mouth every 8 (eight) hours as needed for cough. 20 capsule 0  . budesonide-formoterol (SYMBICORT) 160-4.5 MCG/ACT inhaler Inhale 2 puffs into the lungs 2 (two) times daily. 1 each 11  . cetirizine (ZYRTEC ALLERGY) 10 MG tablet Take 1 tablet (10 mg total) by mouth at bedtime. 30 tablet 6  . ferrous sulfate 325 (65 FE) MG tablet Take 1 tablet (325 mg total) by mouth daily with breakfast. 30 tablet 0  . gabapentin (NEURONTIN) 300 MG capsule Take 1 capsule (300 mg total) by mouth 3 (three) times daily. 50 capsule 0  . hydrochlorothiazide (HYDRODIURIL) 12.5 MG tablet Take 12.5 mg by mouth daily.    . hydrOXYzine (ATARAX/VISTARIL) 25 MG tablet Take 25 mg by mouth 2 (two) times daily as needed for anxiety.  unk  . metaxalone (SKELAXIN) 800 MG tablet Take 1 tablet (800 mg total) by mouth 3 (three) times daily. (Patient taking differently: Take 800 mg by mouth 3 (three) times daily as needed for muscle spasms.) 21 tablet 0  . metFORMIN (GLUCOPHAGE) 500 MG tablet Take 500 mg by mouth every morning.    . montelukast (SINGULAIR) 10 MG tablet Take 1 tablet (10 mg total) by mouth at bedtime. 30 tablet 6  . Multiple Vitamin (MULTIVITAMIN WITH MINERALS) TABS tablet Take 1 tablet by mouth daily. 30 tablet 0  . nicotine (NICODERM CQ) 14 mg/24hr patch Place 1 patch (14 mg total) onto the skin daily. 28 patch 6  . nitroGLYCERIN (NITROSTAT) 0.4 MG SL tablet Place 0.4 mg under the tongue every 5 (five) minutes as needed for chest pain.    Marland Kitchen oxyCODONE (ROXICODONE) 15 MG immediate release tablet Take 15 mg by mouth every 6 (six) hours as needed for pain.    . pantoprazole (PROTONIX) 40 MG tablet Take 1 tablet (40 mg total) by mouth daily. 30 tablet 0  . sildenafil (VIAGRA) 100 MG tablet Take 1 tablet (100 mg total) by mouth daily as needed. (Patient taking differently: Take 100 mg by mouth daily as needed for erectile dysfunction.) 30 tablet 5  . tamsulosin (FLOMAX) 0.4 MG CAPS capsule Take 0.4  mg by mouth every evening.    . Tiotropium Bromide Monohydrate (SPIRIVA RESPIMAT) 2.5 MCG/ACT AERS 2 puffs each am 1 each 11  . vitamin B-12 1000 MCG tablet Take 1 tablet (1,000 mcg total) by mouth daily. 30 tablet 0  . Vitamin D, Ergocalciferol, (DRISDOL) 1.25 MG (50000 UNIT) CAPS capsule Take 50,000 Units by mouth once a week. Mondays    . glipiZIDE (GLUCOTROL) 5 MG tablet Take 0.5 tablets (2.5 mg total) by mouth 2 (two) times daily for 20 days. (Patient not taking: Reported on 05/23/2020) 20 tablet 0    Home: Home Living Family/patient expects to be discharged to:: Private residence Living Arrangements: Spouse/significant other Available Help at Discharge: Family,Available PRN/intermittently Type of Home: Apartment Home Access: Level entry Home Layout: One level Bathroom Shower/Tub: Engineer, manufacturing systems: Standard Home Equipment: None  Lives With: Significant other  Functional History: Prior Function Level of  Independence: Independent Comments: works, owns trucking Research scientist (medical)business Functional Status:  Mobility: Bed Mobility Overal bed mobility: Modified Independent General bed mobility comments: sitting EOB on arrival with OT Transfers Overall transfer level: Needs assistance Equipment used: None Transfers: Sit to/from Stand Sit to Stand: Min guard General transfer comment: min guard for safety and balance Ambulation/Gait Ambulation/Gait assistance: Editor, commissioningMin assist Gait Distance (Feet): 200 Feet Assistive device: None Gait Pattern/deviations: Step-through pattern,Decreased stride length,Decreased dorsiflexion - right,Decreased weight shift to right,Ataxic,Drifts right/left,Wide base of support General Gait Details: drifting L/R throughout. MinA for balance as patient staggering at times due to R LE weakness. R genu recurvatum noted during stance phase. Multimodal cues to limit hyperextension with good follow through. Gait velocity: decreased    ADL: ADL Overall ADL's :  Needs assistance/impaired Grooming: Min guard,Standing Upper Body Bathing: Set up,Sitting Lower Body Bathing: Min guard,Sit to/from stand Upper Body Dressing : Set up,Sitting Lower Body Dressing: Min guard,Sit to/from stand Lower Body Dressing Details (indicate cue type and reason): able to don socks with supervision, min guard sit to stand Toilet Transfer: Minimal assistance,Ambulation Functional mobility during ADLs: Minimal assistance,Cueing for safety General ADL Comments: pt limited by cognition, balance, R sided weakness  Cognition: Cognition Overall Cognitive Status: Impaired/Different from baseline Orientation Level: Oriented X4 Cognition Arousal/Alertness: Awake/alert Behavior During Therapy: WFL for tasks assessed/performed Overall Cognitive Status: Impaired/Different from baseline Area of Impairment: Attention,Memory,Following commands,Safety/judgement,Awareness,Problem solving Current Attention Level: Sustained Memory: Decreased recall of precautions,Decreased short-term memory Following Commands: Follows one step commands consistently,Follows one step commands with increased time,Follows multi-step commands inconsistently Safety/Judgement: Decreased awareness of safety,Decreased awareness of deficits Awareness: Emergent Problem Solving: Requires verbal cues General Comments: Following commands with increased time but difficulty following multi step commands. Decreased awareness of safety. Uses humor at times to hide deficits.  Blood pressure 118/77, pulse 89, temperature 98 F (36.7 C), temperature source Oral, resp. rate 18, SpO2 100 %. Physical Exam Neurological:     Comments: Patient is alert.  No acute distress.  Makes eye contact with examiner.  Oriented x3.  Follows commands.  Fair insight and awareness.     Results for orders placed or performed during the hospital encounter of 06/23/20 (from the past 24 hour(s))  Glucose, capillary     Status: Abnormal    Collection Time: 06/24/20  6:57 AM  Result Value Ref Range   Glucose-Capillary 138 (H) 70 - 99 mg/dL  Glucose, capillary     Status: Abnormal   Collection Time: 06/24/20 11:56 AM  Result Value Ref Range   Glucose-Capillary 107 (H) 70 - 99 mg/dL  Glucose, capillary     Status: Abnormal   Collection Time: 06/24/20  5:19 PM  Result Value Ref Range   Glucose-Capillary 102 (H) 70 - 99 mg/dL  Glucose, capillary     Status: None   Collection Time: 06/24/20  9:57 PM  Result Value Ref Range   Glucose-Capillary 93 70 - 99 mg/dL   CT Head Wo Contrast  Result Date: 06/23/2020 CLINICAL DATA:  Right side weakness EXAM: CT HEAD WITHOUT CONTRAST TECHNIQUE: Contiguous axial images were obtained from the base of the skull through the vertex without intravenous contrast. COMPARISON:  04/15/2020 FINDINGS: Brain: Chronic small vessel disease throughout the deep white matter. No acute intracranial abnormality. Specifically, no hemorrhage, hydrocephalus, mass lesion, acute infarction, or significant intracranial injury. Vascular: No hyperdense vessel or unexpected calcification. Skull: No acute calvarial abnormality. Sinuses/Orbits: No acute findings Other: None IMPRESSION: Chronic small vessel disease. No acute intracranial abnormality. Electronically Signed  By: Charlett Nose M.D.   On: 06/23/2020 09:14   MR ANGIO HEAD WO CONTRAST  Result Date: 06/23/2020 CLINICAL DATA:  Stroke, follow-up. EXAM: MRA HEAD WITHOUT CONTRAST TECHNIQUE: Angiographic images of the Circle of Willis were acquired using MRA technique without intravenous contrast. COMPARISON:  Brain MRI performed earlier today 06/23/2020. Head CT 06/23/2020. Head CT 04/15/2020. FINDINGS: Anterior circulation: The intracranial internal carotid arteries are patent. The M1 middle cerebral arteries are patent. No M2 proximal branch occlusion or high-grade proximal stenosis is identified. The anterior cerebral arteries are patent. Posterior circulation: The  intracranial vertebral arteries are patent. The basilar artery is patent. The posterior cerebral arteries are patent. Hypoplastic right P1 segment with large right posterior communicating artery. The left posterior communicating artery is hypoplastic or absent. Anatomic variants: As described. IMPRESSION: Unremarkable MR angiography of the head. No intracranial large vessel occlusion or proximal high-grade arterial stenosis. Electronically Signed   By: Jackey Loge DO   On: 06/23/2020 14:55   MR BRAIN WO CONTRAST  Result Date: 06/23/2020 CLINICAL DATA:  Neuro deficit, acute, stroke suspected. EXAM: MRI HEAD WITHOUT CONTRAST TECHNIQUE: Multiplanar, multiecho pulse sequences of the brain and surrounding structures were obtained without intravenous contrast. COMPARISON:  Head CT Jun 23, 2020 FINDINGS: Brain: Focus of restricted diffusion within the posterior limb of left internal capsule, consistent with recent infarct. No hemorrhage, hydrocephalus, extra-axial collection or mass lesion scattered and confluent foci of T2 hyperintensity are seen within the white matter of the cerebral hemispheres, nonspecific, most likely related to chronic small vessel ischemia. Vascular: Normal flow voids. Skull and upper cervical spine: Normal marrow signal. Sinuses/Orbits: Negative. Other: Mild left mastoid effusion. IMPRESSION: 1. Acute/subacute infarct within the posterior limb of the left internal capsule. 2. 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 These results were called via secure text paging at the time of interpretation on 06/23/2020 at 12:48 pm to Dr. Milon Dikes. Electronically Signed   By: Baldemar Lenis M.D.   On: 06/23/2020 12:48   ECHOCARDIOGRAM COMPLETE BUBBLE STUDY  Result Date: 06/24/2020    ECHOCARDIOGRAM REPORT   Patient Name:   Glenn [name] Date of Exam: 06/24/2020 Medical Rec #:  [id number]   Height:       65.0 in Accession #:    [id number]  Weight:        145.4 lb Date of Birth:  05-18-1960   BSA:          1.727 m Patient Age:    59 years    BP:           139/93 mmHg Patient Gender: M           HR:           90 bpm. Exam Location:  Inpatient Procedure: 2D Echo, Cardiac Doppler, Color Doppler and Saline Contrast Bubble            Study Indications:    Stroke  History:        Patient has prior history of Echocardiogram examinations, most                 recent 04/13/2020. COPD; Risk Factors:Hypertension and Diabetes.  Sonographer:    Ross Ludwig RDCS (AE) Referring Phys: [id number] JENNIFER YATES  Sonographer Comments: Suboptimal parasternal window. IMPRESSIONS  1. Left ventricular ejection fraction, by estimation, is 55 to 60%. The left ventricle has normal function. The left ventricle has no regional wall motion abnormalities. There is mild concentric left  ventricular hypertrophy. Left ventricular diastolic parameters are consistent with Grade I diastolic dysfunction (impaired relaxation).  2. Right ventricular systolic function is normal. The right ventricular size is normal.  3. The mitral valve is normal in structure. No evidence of mitral valve regurgitation. No evidence of mitral stenosis.  4. The aortic valve is normal in structure. Aortic valve regurgitation is not visualized. No aortic stenosis is present.  5. The inferior vena cava is normal in size with greater than 50% respiratory variability, suggesting right atrial pressure of 3 mmHg.  6. Agitated saline contrast bubble study was negative, with no evidence of any interatrial shunt. Comparison(s): A prior study was performed on 04/13/20. No significant change from prior study. Prior images reviewed side by side. FINDINGS  Left Ventricle: Left ventricular ejection fraction, by estimation, is 55 to 60%. The left ventricle has normal function. The left ventricle has no regional wall motion abnormalities. The left ventricular internal cavity size was normal in size. There is  mild concentric left ventricular  hypertrophy. Left ventricular diastolic parameters are consistent with Grade I diastolic dysfunction (impaired relaxation). Right Ventricle: The right ventricular size is normal. No increase in right ventricular wall thickness. Right ventricular systolic function is normal. Left Atrium: Left atrial size was normal in size. Right Atrium: Right atrial size was normal in size. Pericardium: There is no evidence of pericardial effusion. Mitral Valve: The mitral valve is normal in structure. No evidence of mitral valve regurgitation. No evidence of mitral valve stenosis. MV peak gradient, 2.2 mmHg. The mean mitral valve gradient is 1.0 mmHg. Tricuspid Valve: The tricuspid valve is normal in structure. Tricuspid valve regurgitation is not demonstrated. Aortic Valve: The aortic valve is normal in structure. Aortic valve regurgitation is not visualized. No aortic stenosis is present. Aortic valve mean gradient measures 2.0 mmHg. Aortic valve peak gradient measures 5.1 mmHg. Aortic valve area, by VTI measures 2.19 cm. Pulmonic Valve: The pulmonic valve was not well visualized. Pulmonic valve regurgitation is not visualized. No evidence of pulmonic stenosis. Aorta: The aortic root is normal in size and structure. Venous: The inferior vena cava is normal in size with greater than 50% respiratory variability, suggesting right atrial pressure of 3 mmHg. IAS/Shunts: The atrial septum is grossly normal. Agitated saline contrast was given intravenously to evaluate for intracardiac shunting. Agitated saline contrast bubble study was negative, with no evidence of any interatrial shunt.  LEFT VENTRICLE PLAX 2D LVIDd:         4.00 cm  Diastology LVIDs:         2.90 cm  LV e' medial:    6.96 cm/s LV PW:         1.10 cm  LV E/e' medial:  7.7 LV IVS:        0.90 cm  LV e' lateral:   9.57 cm/s LVOT diam:     1.90 cm  LV E/e' lateral: 5.6 LV SV:         35 LV SV Index:   20 LVOT Area:     2.84 cm  RIGHT VENTRICLE             IVC RV Basal  diam:  3.10 cm     IVC diam: 1.80 cm RV S prime:     13.50 cm/s TAPSE (M-mode): 1.5 cm LEFT ATRIUM             Index       RIGHT ATRIUM  Index LA diam:        2.70 cm 1.56 cm/m  RA Area:     14.90 cm LA Vol (A2C):   57.4 ml 33.23 ml/m RA Volume:   37.50 ml  21.71 ml/m LA Vol (A4C):   38.5 ml 22.29 ml/m LA Biplane Vol: 48.7 ml 28.19 ml/m  AORTIC VALVE AV Area (Vmax):    2.05 cm AV Area (Vmean):   2.39 cm AV Area (VTI):     2.19 cm AV Vmax:           113.00 cm/s AV Vmean:          70.500 cm/s AV VTI:            0.158 m AV Peak Grad:      5.1 mmHg AV Mean Grad:      2.0 mmHg LVOT Vmax:         81.60 cm/s LVOT Vmean:        59.400 cm/s LVOT VTI:          0.122 m LVOT/AV VTI ratio: 0.77  AORTA Ao Root diam: 3.10 cm MITRAL VALVE MV Area (PHT): 2.43 cm    SHUNTS MV Area VTI:   2.41 cm    Systemic VTI:  0.12 m MV Peak grad:  2.2 mmHg    Systemic Diam: 1.90 cm MV Mean grad:  1.0 mmHg MV Vmax:       0.74 m/s MV Vmean:      39.2 cm/s MV Decel Time: 312 msec MV E velocity: 53.60 cm/s MV A velocity: 59.10 cm/s MV E/A ratio:  0.91 Riley Lam MD Electronically signed by Riley Lam MD Signature Date/Time: 06/24/2020/7:20:34 PM    Final    VAS US CAROTID  Result Date: 06/24/2020 Carotid Arterial Duplex Study Patient Name:  Glenn [name]  Date of Exam:   06/23/2020 Medical Rec #: [id number]    Accession #:    [id number] Date of Birth: 1960-10-08    Patient Gender: M Patient Age:   59Y Exam Location:  [facility] Procedure:      VAS US CAROTID Referring Phys: [id number] Reyne Dumas [facility] --------------------------------------------------------------------------------  Indications:       CVA. Risk Factors:      Hypertension, Diabetes. Comparison Study:  No prior study Performing Technologist: Gertie Fey MHA, RDMS, RVT, RDCS  Examination Guidelines: A complete evaluation includes B-mode imaging, spectral Doppler, color Doppler, and power Doppler as needed of all accessible portions of  each vessel. Bilateral testing is considered an integral part of a complete examination. Limited examinations for reoccurring indications may be performed as noted.  Right Carotid Findings: +----------+--------+-------+--------+----------------------+------------------+           PSV cm/sEDV    StenosisPlaque Description    Comments                             cm/s                                                    +----------+--------+-------+--------+----------------------+------------------+ CCA Prox  50      16                                                      +----------+--------+-------+--------+----------------------+------------------+  CCA Distal44      18                                   intimal thickening +----------+--------+-------+--------+----------------------+------------------+ ICA Prox  33      15             smooth and homogeneous                   +----------+--------+-------+--------+----------------------+------------------+ ICA Distal82      35                                   tortuous           +----------+--------+-------+--------+----------------------+------------------+ ECA       60      11             smooth and                                                                heterogenous                             +----------+--------+-------+--------+----------------------+------------------+ +----------+--------+-------+----------------+-------------------+           PSV cm/sEDV cmsDescribe        Arm Pressure (mmHG) +----------+--------+-------+----------------+-------------------+ [id number]             Multiphasic, WNL                    +----------+--------+-------+----------------+-------------------+ +---------+--------+--+--------+--+---------+ VertebralPSV cm/s43EDV cm/s12Antegrade +---------+--------+--+--------+--+---------+  Left Carotid Findings:  +----------+--------+-------+--------+----------------------+------------------+           PSV cm/sEDV    StenosisPlaque Description    Comments                             cm/s                                                    +----------+--------+-------+--------+----------------------+------------------+ CCA Prox  68      20             smooth and                                                                heterogenous                             +----------+--------+-------+--------+----------------------+------------------+ CCA Distal48      13                                                      +----------+--------+-------+--------+----------------------+------------------+  ICA Prox  53      17                                   intimal thickening +----------+--------+-------+--------+----------------------+------------------+ ICA Distal68      26                                                      +----------+--------+-------+--------+----------------------+------------------+ ECA       52      14                                                      +----------+--------+-------+--------+----------------------+------------------+ +----------+--------+--------+----------------+-------------------+           PSV cm/sEDV cm/sDescribe        Arm Pressure (mmHG) +----------+--------+--------+----------------+-------------------+ [id number]             Multiphasic, WNL                    +----------+--------+--------+----------------+-------------------+ +---------+--------+--+--------+--+---------+ VertebralPSV cm/s36EDV cm/s15Antegrade +---------+--------+--+--------+--+---------+   Summary: Right Carotid: Velocities in the right ICA are consistent with a 1-39% stenosis. Left Carotid: Velocities in the left ICA are consistent with a 1-39% stenosis. Vertebrals:  Bilateral vertebral arteries demonstrate antegrade flow. Subclavians:  Normal flow hemodynamics were seen in bilateral subclavian              arteries. *See table(s) above for measurements and observations.  Electronically signed by Delia Heady MD on 06/24/2020 at 8:13:30 AM.    Final     ***  Charlton Amor, PA-C 06/25/2020

## 2020-06-25 NOTE — TOC Transition Note (Signed)
Transition of Care [facility]) - CM/SW Discharge Note   Patient Details  Name: Glenn [name] MRN: [id number] Date of Birth: 27-Aug-1960  Transition of Care [facility]) CM/SW Contact:  Pollie Friar, RN Phone Number: 06/25/2020, 1:43 PM   Clinical Narrative:    Patient has decided against CIR and wants to d/c home today with Outpatient therapy. CM had met with the patient yesterday and he was in agreement with [facility]. Orders in Epic and information on the AVS.  Pt denies issues with transportation and states he has supervision at home.  Pt also denies issues with home medications. Pt has transport home today.   Final next level of care: OP Rehab Barriers to Discharge: No Barriers Identified   Patient Goals and CMS Choice     Choice offered to / list presented to : Patient  Discharge Placement                       Discharge Plan and Services                                     Social Determinants of Health (SDOH) Interventions     Readmission Risk Interventions Readmission Risk Prevention Plan 05/25/2020  Transportation Screening Complete  Medication Review Press photographer) Complete  PCP or Specialist appointment within 3-5 days of discharge Complete  HRI or [place] Complete  SW Recovery Care/Counseling Consult Complete  [place] Not Applicable  Some recent data might be hidden

## 2020-06-25 NOTE — Progress Notes (Signed)
Initial Nutrition Assessment  DOCUMENTATION CODES:  Not applicable  INTERVENTION:  -Glucerna Shake po TID, each supplement provides 220 kcal and 10 grams of protein -24ml Prosource Plus po BID, each supplement provides 100 kcals and 15 grams of protein -MVI with minerals daily  NUTRITION DIAGNOSIS:  Increased nutrient needs related to other (see comment) (upcoming therapies) as evidenced by estimated needs.  GOAL:  Patient will meet greater than or equal to 90% of their needs  MONITOR:  PO intake,Supplement acceptance,Labs,Weight trends,I & O's  REASON FOR ASSESSMENT:  Consult Assessment of nutrition requirement/status  ASSESSMENT:  Pt with PMH including COPD, HTN, type 2 DM, and chronic pain admitted with CVA.  Pt unavailable at time of RD visit; being evaluated for admission to CIR. No PO intake documented, but RN reports pt did really well with breakfast today. RN unable to speak towards intake prior to this morning. Will order oral nutrition supplements for pt in case po intake is not adequate s/p CVA.   Reviewed weight history. No weight taken since 06/09/20, but weight appears stable x2 years based on available weight readings  UOP: x24 hours I/O: +91.55ml since admit  Labs and medications reviewed.  CBGs 93-110-160  Diet Order:   Diet Order            Diet heart healthy/carb modified Room service appropriate? Yes; Fluid consistency: Thin  Diet effective ____                EDUCATION NEEDS:  No education needs have been identified at this time  Skin:  Skin Assessment: Reviewed RN Assessment  Last BM:  5/17  Height:  Ht Readings from Last 1 Encounters:  06/09/20 5\' 5"  (1.651 m)   Weight:  Wt Readings from Last 1 Encounters:  06/09/20 66 kg   BMI:  There is no height or weight on file to calculate BMI.  Estimated Nutritional Needs:  Kcal:  1800-2000 Protein:  90-100g Fluid:  >1.8L/d    08/09/20, MS, RD, LDN RD pager number and  weekend/on-call pager number located in Amion.

## 2020-06-25 NOTE — Progress Notes (Signed)
IP rehab admissions - I met with patient at the bedside.  A rehab MD consult is pending completion.  Patient has been ambulatory to the bathroom by himself.  He says he drags his foot.  Patient prefers outpatient therapy rather than inpatient rehab therapy.  I will follow up again once rehab consult is completed.  Call me for questions.  [phone]

## 2020-06-25 NOTE — ED Triage Notes (Signed)
Patient reports right breast lump" knot" onset today , denies injury or swelling /no drainage .

## 2020-06-25 NOTE — Discharge Summary (Signed)
Physician Discharge Summary  Glenn [name] RJJ:[id number] DOB: Mar 13, 1960 DOA: 06/23/2020  PCP: Fleet Contras, MD  Admit date: 06/23/2020 Discharge date: 06/25/2020  Admitted From: Home  Discharge disposition: Home  Recommendations for Outpatient Follow-Up:   . Follow up with your primary care provider in one week.  . Check CBC, BMP, magnesium in the next visit . Follow-up with [facility] neurology clinic in about 4 weeks (ambulatory referral has been made to neurology clinic). . Continue outpatient physical therapy  Discharge Diagnosis:   Principal Problem:   Acute CVA (cerebrovascular accident) (HCC) Active Problems:   COPD GOLD III/ actively smoking    Hypertension   Diabetes mellitus type 2 in nonobese [facility])   Chronic pain   Discharge Condition: Improved.  Diet recommendation: Low sodium, heart healthy.  Carbohydrate-modified.   Wound care: None.  Code status: Full.   History of Present Illness:   Glenn [name] a 59 y.o.malewith medical history significant ofCOPD, esstential HTN; diabetes mellitus type 2 and chronic pain presented to the hospital with Right sided weakness with ambulatory dysfunction and mild confusion.  He felt like his equilibrium was offand his right arm was weak.  Patient does have history of mild stroke in 2011.  In the ED patient had an MRI of the brain which showed CVA in the posterior limb of the internal capsule.  Patient was then seen by neurology.  He was then admitted to hospital for further evaluation and treatment.   Hospital Course:   Following conditions were addressed during hospitalization as listed below,  Acute ischemic stroke with right-sided weakness. MRI showing acute/subacute infarct in the posterior limb of the left internal capsule.  Patient presented outside the tPA window.  Continue aspirin and plavix on discharge.  Patient will continue aspirin for 3 weeks and continue Plavix indefinitely.  2D echocardiogram showed a  preserved LV function at 55 to 60%.  Carotid duplex ultrasound showed no significant stenosis. Hemoglobin A1c was 6.8.  Elevated cholesterol with a total cholesterol of 241 and LDL of 135.    Physical therapy and Occupational Therapy saw the patient and recommended rehabilitation. At this time, patient wishes to go home with outpatient physical therapy.  Patient will also continue Lipitor 40 daily.  Essential HTN   On Norvasc HCTZ as outpatient.    This will be resumed on discharge.  Chronic pain On oxycodone at home.  Patient will resume at home.  DM type II On glipizide and metformin at home.  COPD Received  nicotine patch. Stopped smoking early April.  Continue Spiriva, Symbicort, albuterol Singulair.   Disposition.  At this time, patient is stable for disposition with outpatient PCP follow-up.  Patient will need to follow-up with PCP and [facility] neurology as outpatient.  Medical Consultants:    Neurology  Procedures:    None Subjective:   Today, patient was seen and examined at bedside.  Patient feels well.  Wishes to go home.  Denies any nausea vomiting fever chills weakness.  Discharge Exam:   Vitals:   06/25/20 0749 06/25/20 1138  BP: 136/82 (!) 153/93  Pulse: 93 88  Resp:  20  Temp: 98.3 F (36.8 C) 97.7 F (36.5 C)  SpO2: 96% 98%   Vitals:   06/25/20 0025 06/25/20 0420 06/25/20 0749 06/25/20 1138  BP: (!) 129/93 118/77 136/82 (!) 153/93  Pulse: 88 89 93 88  Resp: 18 18  20   Temp: 98 F (36.7 C) 98 F (36.7 C) 98.3 F (36.8 C) 97.7 F (36.5  C)  TempSrc: Oral Oral Oral Oral  SpO2: 95% 100% 96% 98%    General: Alert awake, not in obvious distress HENT: pupils equally reacting to light,  No scleral pallor or icterus noted. Oral mucosa is moist.  Chest:  Clear breath sounds.  Diminished breath sounds bilaterally. No crackles or wheezes.  CVS: S1 &S2 heard. No murmur.  Regular rate and rhythm. Abdomen: Soft, nontender, nondistended.  Bowel sounds  are heard.   Extremities: No cyanosis, clubbing or edema.  Peripheral pulses are palpable.  Mild right-sided weakness noted. Psych: Alert, awake and oriented, normal mood CNS:  No cranial nerve deficits.  Mild right-sided weakness noted. Skin: Warm and dry.  No rashes noted.  The results of significant diagnostics from this hospitalization (including imaging, microbiology, ancillary and laboratory) are listed below for reference.     Diagnostic Studies:   CT Head Wo Contrast  Result Date: 06/23/2020 CLINICAL DATA:  Right side weakness EXAM: CT HEAD WITHOUT CONTRAST TECHNIQUE: Contiguous axial images were obtained from the base of the skull through the vertex without intravenous contrast. COMPARISON:  04/15/2020 FINDINGS: Brain: Chronic small vessel disease throughout the deep white matter. No acute intracranial abnormality. Specifically, no hemorrhage, hydrocephalus, mass lesion, acute infarction, or significant intracranial injury. Vascular: No hyperdense vessel or unexpected calcification. Skull: No acute calvarial abnormality. Sinuses/Orbits: No acute findings Other: None IMPRESSION: Chronic small vessel disease. No acute intracranial abnormality. Electronically Signed   By: Charlett Nose M.D.   On: 06/23/2020 09:14   MR ANGIO HEAD WO CONTRAST  Result Date: 06/23/2020 CLINICAL DATA:  Stroke, follow-up. EXAM: MRA HEAD WITHOUT CONTRAST TECHNIQUE: Angiographic images of the Circle of Willis were acquired using MRA technique without intravenous contrast. COMPARISON:  Brain MRI performed earlier today 06/23/2020. Head CT 06/23/2020. Head CT 04/15/2020. FINDINGS: Anterior circulation: The intracranial internal carotid arteries are patent. The M1 middle cerebral arteries are patent. No M2 proximal branch occlusion or high-grade proximal stenosis is identified. The anterior cerebral arteries are patent. Posterior circulation: The intracranial vertebral arteries are patent. The basilar artery is patent.  The posterior cerebral arteries are patent. Hypoplastic right P1 segment with large right posterior communicating artery. The left posterior communicating artery is hypoplastic or absent. Anatomic variants: As described. IMPRESSION: Unremarkable MR angiography of the head. No intracranial large vessel occlusion or proximal high-grade arterial stenosis. Electronically Signed   By: Jackey Loge DO   On: 06/23/2020 14:55   MR BRAIN WO CONTRAST  Result Date: 06/23/2020 CLINICAL DATA:  Neuro deficit, acute, stroke suspected. EXAM: MRI HEAD WITHOUT CONTRAST TECHNIQUE: Multiplanar, multiecho pulse sequences of the brain and surrounding structures were obtained without intravenous contrast. COMPARISON:  Head CT Jun 23, 2020 FINDINGS: Brain: Focus of restricted diffusion within the posterior limb of left internal capsule, consistent with recent infarct. No hemorrhage, hydrocephalus, extra-axial collection or mass lesion scattered and confluent foci of T2 hyperintensity are seen within the white matter of the cerebral hemispheres, nonspecific, most likely related to chronic small vessel ischemia. Vascular: Normal flow voids. Skull and upper cervical spine: Normal marrow signal. Sinuses/Orbits: Negative. Other: Mild left mastoid effusion. IMPRESSION: 1. Acute/subacute infarct within the posterior limb of the left internal capsule. 2. 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 These results were called via secure text paging at the time of interpretation on 06/23/2020 at 12:48 pm to Dr. Milon Dikes. Electronically Signed   By: Baldemar Lenis M.D.   On: 06/23/2020 12:48   ECHOCARDIOGRAM COMPLETE BUBBLE STUDY  Result Date: 06/24/2020    ECHOCARDIOGRAM REPORT   Patient Name:   Glenn [name] Date of Exam: 06/24/2020 Medical Rec #:  [id number]   Height:       65.0 in Accession #:    [id number]  Weight:       145.4 lb Date of Birth:  1960/05/14   BSA:          1.727 m Patient Age:     59 years    BP:           139/93 mmHg Patient Gender: M           HR:           90 bpm. Exam Location:  Inpatient Procedure: 2D Echo, Cardiac Doppler, Color Doppler and Saline Contrast Bubble            Study Indications:    Stroke  History:        Patient has prior history of Echocardiogram examinations, most                 recent 04/13/2020. COPD; Risk Factors:Hypertension and Diabetes.  Sonographer:    Ross Ludwig RDCS (AE) Referring Phys: [id number] JENNIFER YATES  Sonographer Comments: Suboptimal parasternal window. IMPRESSIONS  1. Left ventricular ejection fraction, by estimation, is 55 to 60%. The left ventricle has normal function. The left ventricle has no regional wall motion abnormalities. There is mild concentric left ventricular hypertrophy. Left ventricular diastolic parameters are consistent with Grade I diastolic dysfunction (impaired relaxation).  2. Right ventricular systolic function is normal. The right ventricular size is normal.  3. The mitral valve is normal in structure. No evidence of mitral valve regurgitation. No evidence of mitral stenosis.  4. The aortic valve is normal in structure. Aortic valve regurgitation is not visualized. No aortic stenosis is present.  5. The inferior vena cava is normal in size with greater than 50% respiratory variability, suggesting right atrial pressure of 3 mmHg.  6. Agitated saline contrast bubble study was negative, with no evidence of any interatrial shunt. Comparison(s): A prior study was performed on 04/13/20. No significant change from prior study. Prior images reviewed side by side. FINDINGS  Left Ventricle: Left ventricular ejection fraction, by estimation, is 55 to 60%. The left ventricle has normal function. The left ventricle has no regional wall motion abnormalities. The left ventricular internal cavity size was normal in size. There is  mild concentric left ventricular hypertrophy. Left ventricular diastolic parameters are consistent with Grade I  diastolic dysfunction (impaired relaxation). Right Ventricle: The right ventricular size is normal. No increase in right ventricular wall thickness. Right ventricular systolic function is normal. Left Atrium: Left atrial size was normal in size. Right Atrium: Right atrial size was normal in size. Pericardium: There is no evidence of pericardial effusion. Mitral Valve: The mitral valve is normal in structure. No evidence of mitral valve regurgitation. No evidence of mitral valve stenosis. MV peak gradient, 2.2 mmHg. The mean mitral valve gradient is 1.0 mmHg. Tricuspid Valve: The tricuspid valve is normal in structure. Tricuspid valve regurgitation is not demonstrated. Aortic Valve: The aortic valve is normal in structure. Aortic valve regurgitation is not visualized. No aortic stenosis is present. Aortic valve mean gradient measures 2.0 mmHg. Aortic valve peak gradient measures 5.1 mmHg. Aortic valve area, by VTI measures 2.19 cm. Pulmonic Valve: The pulmonic valve was not well visualized. Pulmonic valve regurgitation is not visualized. No evidence of pulmonic stenosis. Aorta: The aortic root is normal in  size and structure. Venous: The inferior vena cava is normal in size with greater than 50% respiratory variability, suggesting right atrial pressure of 3 mmHg. IAS/Shunts: The atrial septum is grossly normal. Agitated saline contrast was given intravenously to evaluate for intracardiac shunting. Agitated saline contrast bubble study was negative, with no evidence of any interatrial shunt.  LEFT VENTRICLE PLAX 2D LVIDd:         4.00 cm  Diastology LVIDs:         2.90 cm  LV e' medial:    6.96 cm/s LV PW:         1.10 cm  LV E/e' medial:  7.7 LV IVS:        0.90 cm  LV e' lateral:   9.57 cm/s LVOT diam:     1.90 cm  LV E/e' lateral: 5.6 LV SV:         35 LV SV Index:   20 LVOT Area:     2.84 cm  RIGHT VENTRICLE             IVC RV Basal diam:  3.10 cm     IVC diam: 1.80 cm RV S prime:     13.50 cm/s TAPSE (M-mode):  1.5 cm LEFT ATRIUM             Index       RIGHT ATRIUM           Index LA diam:        2.70 cm 1.56 cm/m  RA Area:     14.90 cm LA Vol (A2C):   57.4 ml 33.23 ml/m RA Volume:   37.50 ml  21.71 ml/m LA Vol (A4C):   38.5 ml 22.29 ml/m LA Biplane Vol: 48.7 ml 28.19 ml/m  AORTIC VALVE AV Area (Vmax):    2.05 cm AV Area (Vmean):   2.39 cm AV Area (VTI):     2.19 cm AV Vmax:           113.00 cm/s AV Vmean:          70.500 cm/s AV VTI:            0.158 m AV Peak Grad:      5.1 mmHg AV Mean Grad:      2.0 mmHg LVOT Vmax:         81.60 cm/s LVOT Vmean:        59.400 cm/s LVOT VTI:          0.122 m LVOT/AV VTI ratio: 0.77  AORTA Ao Root diam: 3.10 cm MITRAL VALVE MV Area (PHT): 2.43 cm    SHUNTS MV Area VTI:   2.41 cm    Systemic VTI:  0.12 m MV Peak grad:  2.2 mmHg    Systemic Diam: 1.90 cm MV Mean grad:  1.0 mmHg MV Vmax:       0.74 m/s MV Vmean:      39.2 cm/s MV Decel Time: 312 msec MV E velocity: 53.60 cm/s MV A velocity: 59.10 cm/s MV E/A ratio:  0.91 Riley LamMahesh Chandrasekhar MD Electronically signed by Riley LamMahesh Chandrasekhar MD Signature Date/Time: 06/24/2020/7:20:34 PM    Final    VAS US CAROTID  Result Date: 06/24/2020 Carotid Arterial Duplex Study Patient Name:  Glenn [name]  Date of Exam:   06/23/2020 Medical Rec #: [id number]    Accession #:    [id number] Date of Birth: 12/10/1960    Patient Gender: M Patient Age:   59Y [facility] Procedure:      VAS US CAROTID Referring Phys: [id number] Reyne Dumas [facility] --------------------------------------------------------------------------------  Indications:       CVA. Risk Factors:      Hypertension, Diabetes. Comparison Study:  No prior study Performing Technologist: Gertie Fey MHA, RDMS, RVT, RDCS  Examination Guidelines: A complete evaluation includes B-mode imaging, spectral Doppler, color Doppler, and power Doppler as needed of all accessible portions of each vessel. Bilateral testing is considered an integral part of a complete  examination. Limited examinations for reoccurring indications may be performed as noted.  Right Carotid Findings: +----------+--------+-------+--------+----------------------+------------------+           PSV cm/sEDV    StenosisPlaque Description    Comments                             cm/s                                                    +----------+--------+-------+--------+----------------------+------------------+ CCA Prox  50      16                                                      +----------+--------+-------+--------+----------------------+------------------+ CCA Distal44      18                                   intimal thickening +----------+--------+-------+--------+----------------------+------------------+ ICA Prox  33      15             smooth and homogeneous                   +----------+--------+-------+--------+----------------------+------------------+ ICA Distal82      35                                   tortuous           +----------+--------+-------+--------+----------------------+------------------+ ECA       60      11             smooth and                                                                heterogenous                             +----------+--------+-------+--------+----------------------+------------------+ +----------+--------+-------+----------------+-------------------+           PSV cm/sEDV cmsDescribe        Arm Pressure (mmHG) +----------+--------+-------+----------------+-------------------+ [id number]             Multiphasic, WNL                    +----------+--------+-------+----------------+-------------------+ +---------+--------+--+--------+--+---------+ VertebralPSV cm/s43EDV cm/s12Antegrade +---------+--------+--+--------+--+---------+  Left Carotid Findings: +----------+--------+-------+--------+----------------------+------------------+  PSV cm/sEDV     StenosisPlaque Description    Comments                             cm/s                                                    +----------+--------+-------+--------+----------------------+------------------+ CCA Prox  68      20             smooth and                                                                heterogenous                             +----------+--------+-------+--------+----------------------+------------------+ CCA Distal48      13                                                      +----------+--------+-------+--------+----------------------+------------------+ ICA Prox  53      17                                   intimal thickening +----------+--------+-------+--------+----------------------+------------------+ ICA Distal68      26                                                      +----------+--------+-------+--------+----------------------+------------------+ ECA       52      14                                                      +----------+--------+-------+--------+----------------------+------------------+ +----------+--------+--------+----------------+-------------------+           PSV cm/sEDV cm/sDescribe        Arm Pressure (mmHG) +----------+--------+--------+----------------+-------------------+ [id number]             Multiphasic, WNL                    +----------+--------+--------+----------------+-------------------+ +---------+--------+--+--------+--+---------+ VertebralPSV cm/s36EDV cm/s15Antegrade +---------+--------+--+--------+--+---------+   Summary: Right Carotid: Velocities in the right ICA are consistent with a 1-39% stenosis. Left Carotid: Velocities in the left ICA are consistent with a 1-39% stenosis. Vertebrals:  Bilateral vertebral arteries demonstrate antegrade flow. Subclavians: Normal flow hemodynamics were seen in bilateral subclavian              arteries. *See table(s) above for  measurements and observations.  Electronically signed by Delia Heady MD on 06/24/2020 at 8:13:30 AM.    Final  Labs:   Basic Metabolic Panel: Recent Labs  Lab 06/23/20 0841 06/23/20 0918  NA 137 137  K 4.1 4.0  CL 102 101  CO2 28  --   GLUCOSE 115* 110*  BUN 13 15  CREATININE 0.80 0.70  CALCIUM 9.5  --    GFR CrCl cannot be calculated (Unknown ideal weight.). Liver Function Tests: Recent Labs  Lab 06/23/20 0841  AST 18  ALT 20  ALKPHOS 48  BILITOT 0.6  PROT 6.6  ALBUMIN 4.0   No results for input(s): LIPASE, AMYLASE in the last 168 hours. No results for input(s): AMMONIA in the last 168 hours. Coagulation profile Recent Labs  Lab 06/23/20 0841  INR 0.9    CBC: Recent Labs  Lab 06/23/20 0841 06/23/20 0918  WBC 6.2  --   NEUTROABS 3.7  --   HGB 13.8 14.3  HCT 42.8 42.0  MCV 90.3  --   PLT 240  --    Cardiac Enzymes: No results for input(s): CKTOTAL, CKMB, CKMBINDEX, TROPONINI in the last 168 hours. BNP: Invalid input(s): POCBNP CBG: Recent Labs  Lab 06/24/20 1719 06/24/20 2157 06/25/20 0624 06/25/20 0822 06/25/20 1139  GLUCAP 102* 93 110* 160* 101*   D-Dimer No results for input(s): DDIMER in the last 72 hours. Hgb A1c Recent Labs    06/24/20 0303  HGBA1C 6.8*   Lipid Profile Recent Labs    06/24/20 0303  CHOL 241*  HDL 86  LDLCALC 135*  TRIG 98  CHOLHDL 2.8   Thyroid function studies No results for input(s): TSH, T4TOTAL, T3FREE, THYROIDAB in the last 72 hours.  Invalid input(s): FREET3 Anemia work up No results for input(s): VITAMINB12, FOLATE, FERRITIN, TIBC, IRON, RETICCTPCT in the last 72 hours. Microbiology Recent Results (from the past 240 hour(s))  SARS CORONAVIRUS 2 (TAT 6-24 HRS) Nasopharyngeal Nasopharyngeal Swab     Status: None   Collection Time: 06/23/20 12:53 PM   Specimen: Nasopharyngeal Swab  Result Value Ref Range Status   SARS Coronavirus 2 NEGATIVE NEGATIVE Final    Comment: (NOTE) SARS-CoV-2  target nucleic acids are NOT DETECTED.  The SARS-CoV-2 RNA is generally detectable in upper and lower respiratory specimens during the acute phase of infection. Negative results do not preclude SARS-CoV-2 infection, do not rule out co-infections with other pathogens, and should not be used as the sole basis for treatment or other patient management decisions. Negative results must be combined with clinical observations, patient history, and epidemiological information. The expected result is Negative.  Fact Sheet for Patients: HairSlick.no  Fact Sheet for Healthcare Providers: quierodirigir.com  This test is not yet approved or cleared by the [place] FDA and  has been authorized for detection and/or diagnosis of SARS-CoV-2 by FDA under an Emergency Use Authorization (EUA). This EUA will remain  in effect (meaning this test can be used) for the duration of the COVID-19 declaration under Se ction 564(b)(1) of the Act, 21 U.S.C. section 360bbb-3(b)(1), unless the authorization is terminated or revoked sooner.  Performed at [facility], [address], [place], [place] [address]      Discharge Instructions:   Discharge Instructions    Ambulatory referral to Neurology   Complete by: As directed    Follow up with stroke clinic NP (Jessica Vanschaick or Darrol Angel, if both not available, consider Manson Allan, or Ahern) at [facility] in about 4 weeks. Thanks.   Ambulatory referral to Occupational Therapy   Complete by: As directed    Ambulatory  referral to Physical Therapy   Complete by: As directed    Diet - low sodium heart healthy   Complete by: As directed    Diet Carb Modified   Complete by: As directed    Discharge instructions   Complete by: As directed    Follow-up with your primary care physician in 1 week.  Follow-up with [facility] as scheduled by the clinic.  Seek medical attention  for worsening symptoms.  Continue aspirin only for 21 days.   Increase activity slowly   Complete by: As directed      Allergies as of 06/25/2020      Reactions   Insulins Swelling   Ibuprofen    Abdominal sensitivity   Shrimp [shellfish Allergy] Itching   Tylenol [acetaminophen]    Abdominal sensitvity   Lisinopril Rash   Tongue swelling      Medication List    TAKE these medications   albuterol (2.5 MG/3ML) 0.083% nebulizer solution Commonly known as: PROVENTIL PLEASE SEE ATTACHED FOR DETAILED DIRECTIONS What changed: See the new instructions.   albuterol 108 (90 Base) MCG/ACT inhaler Commonly known as: Proventil HFA Inhale 2 puffs into the lungs every 4 (four) hours as needed for wheezing or shortness of breath. What changed: Another medication with the same name was changed. Make sure you understand how and when to take each.   Amitiza 24 MCG capsule Generic drug: lubiprostone Take 24 mcg by mouth at bedtime.   amLODipine 10 MG tablet Commonly known as: NORVASC Take 10 mg by mouth daily.   Aspirin Low Dose 81 MG EC tablet Generic drug: aspirin Take 1 tablet (81 mg total) by mouth daily for 21 days.   atorvastatin 40 MG tablet Commonly known as: LIPITOR Take 1 tablet (40 mg total) by mouth daily. Start taking on: Jun 26, 2020   benzonatate 100 MG capsule Commonly known as: TESSALON Take 2 capsules (200 mg total) by mouth every 8 (eight) hours as needed for cough.   budesonide-formoterol 160-4.5 MCG/ACT inhaler Commonly known as: Symbicort Inhale 2 puffs into the lungs 2 (two) times daily.   cetirizine 10 MG tablet Commonly known as: ZyrTEC Allergy Take 1 tablet (10 mg total) by mouth at bedtime.   clopidogrel 75 MG tablet Commonly known as: PLAVIX Take 1 tablet (75 mg total) by mouth daily. Start taking on: Jun 26, 2020   cyanocobalamin 1000 MCG tablet Take 1 tablet (1,000 mcg total) by mouth daily.   ferrous sulfate 325 (65 FE) MG tablet Take 1  tablet (325 mg total) by mouth daily with breakfast.   gabapentin 300 MG capsule Commonly known as: Neurontin Take 1 capsule (300 mg total) by mouth 3 (three) times daily.   glipiZIDE 5 MG tablet Commonly known as: GLUCOTROL Take 0.5 tablets (2.5 mg total) by mouth 2 (two) times daily for 20 days.   hydrochlorothiazide 12.5 MG tablet Commonly known as: HYDRODIURIL Take 12.5 mg by mouth daily.   hydrOXYzine 25 MG tablet Commonly known as: ATARAX/VISTARIL Take 25 mg by mouth 2 (two) times daily as needed for anxiety.   metaxalone 800 MG tablet Commonly known as: SKELAXIN Take 1 tablet (800 mg total) by mouth 3 (three) times daily. What changed:   when to take this  reasons to take this   metFORMIN 500 MG tablet Commonly known as: GLUCOPHAGE Take 500 mg by mouth every morning.   montelukast 10 MG tablet Commonly known as: SINGULAIR Take 1 tablet (10 mg total) by mouth  at bedtime.   multivitamin with minerals Tabs tablet Take 1 tablet by mouth daily.   nicotine 14 mg/24hr patch Commonly known as: Nicoderm CQ Place 1 patch (14 mg total) onto the skin daily.   nitroGLYCERIN 0.4 MG SL tablet Commonly known as: NITROSTAT Place 0.4 mg under the tongue every 5 (five) minutes as needed for chest pain.   oxyCODONE 15 MG immediate release tablet Commonly known as: ROXICODONE Take 15 mg by mouth every 6 (six) hours as needed for pain.   pantoprazole 40 MG tablet Commonly known as: PROTONIX Take 1 tablet (40 mg total) by mouth daily.   sildenafil 100 MG tablet Commonly known as: VIAGRA Take 1 tablet (100 mg total) by mouth daily as needed. What changed: reasons to take this   Spiriva Respimat 2.5 MCG/ACT Aers Generic drug: Tiotropium Bromide Monohydrate 2 puffs each am   tamsulosin 0.4 MG Caps capsule Commonly known as: FLOMAX Take 0.4 mg by mouth every evening.   Vitamin D (Ergocalciferol) 1.25 MG (50000 UNIT) Caps capsule Commonly known as: DRISDOL Take 50,000  Units by mouth once a week. Mondays       Follow-up Information    [facility]. Schedule an appointment as soon as possible for a visit in 4 week(s).   Specialty: Neurology Contact information: [address] [place] [address] [phone]       [facility] Follow [facility].   Specialty: Rehabilitation Why: The outpatient therapy will contact you for the first appointment Contact information: [address] [id number] [place] [address] [phone]       Fleet Contras, MD. Schedule an appointment as soon as possible for a visit in 1 week(s).   Specialty: Internal Medicine Why: Regular follow-up, blood work Contact information: [address] [place] [address] [phone]                Time coordinating discharge: 39 minutes  Signed:  Zeriyah Wain  [facility] 06/25/2020, 1:34 PM

## 2020-06-25 NOTE — Progress Notes (Signed)
Discharge packet and instructions provided using teach-back method. Pt verbalized understanding. All questions and concerns addressed. VS wnL and as per flow. IV removed. Pt showered, changed and sitting on recliner waiting on fiancee for transport.

## 2020-06-25 NOTE — Progress Notes (Signed)
Physical Therapy Treatment Patient Details Name: Glenn [name] MRN: [id number] DOB: Feb 27, 1960 Today's Date: 06/25/2020    History of Present Illness 60 y/o male presented to ED on 5/17 with concern for gait difficulty and R sided weakness. MRI found acute/subacute infarct within posterior limb of L internal capsule. PMH: CVA in 2010, COPD, DM type 2, HTN, asthma    PT Comments    Patient continues to require min-modA for mobility especially in open environment. Easily distracted with more prominent balance deficits observed. Patient scored 13/24 on DGI which is indicative of high falls risk. Educated patient and fiance on fall risk and need for assistance with OOB mobility. Educated patient about benefits of CIR and encouraged patient and fiance to consider. Continue to recommend comprehensive inpatient rehab (CIR) for post-acute therapy needs, however if patient refuses, recommend OPPT at OP neuro clinic and 24 hour supervision/assistance at discharge.     Follow Up Recommendations  CIR (if patient refuses CIR, recommend OPPT at OP neuro and 24 hour supervision/assistance)     Equipment Recommendations  None recommended by PT    Recommendations for Other Services       Precautions / Restrictions Precautions Precautions: Fall Restrictions Weight Bearing Restrictions: No    Mobility  Bed Mobility Overal bed mobility: Modified Independent                  Transfers Overall transfer level: Needs assistance Equipment used: None Transfers: Sit to/from Stand Sit to Stand: Min guard         General transfer comment: min guard for safety and balance  Ambulation/Gait Ambulation/Gait assistance: Min assist Gait Distance (Feet): 250 Feet Assistive device: None Gait Pattern/deviations: Step-through pattern;Decreased stride length;Decreased dorsiflexion - right;Decreased weight shift to right;Ataxic;Drifts right/left;Wide base of support Gait velocity: decreased   General  Gait Details: minA for balance with challenges during ambulation. Difficulty dual tasking and easily distracted which requires minA for safety   Stairs Stairs: Yes Stairs assistance: Min assist;Mod assist Stair Management: No rails;Alternating pattern;Forwards Number of Stairs: 2 General stair comments: minA with ascent. On descent, required modA for LOB to R   Wheelchair Mobility    Modified Rankin (Stroke Patients Only) Modified Rankin (Stroke Patients Only) Pre-Morbid Rankin Score: No symptoms Modified Rankin: Moderately severe disability     Balance Overall balance assessment: Needs assistance Sitting-balance support: No upper extremity supported;Feet supported Sitting balance-Leahy Scale: Good     Standing balance support: No upper extremity supported;During functional activity Standing balance-Leahy Scale: Poor Standing balance comment: requires min-modA for dynamic balance activities                 Standardized Balance Assessment Standardized Balance Assessment : Dynamic Gait Index   Dynamic Gait Index Level Surface: Mild Impairment Change in Gait Speed: Moderate Impairment Gait with Horizontal Head Turns: Mild Impairment Gait with Vertical Head Turns: Mild Impairment Gait and Pivot Turn: Severe Impairment Step Over Obstacle: Mild Impairment Step Around Obstacles: Mild Impairment Steps: Mild Impairment Total Score: 13      Cognition Arousal/Alertness: Awake/alert Behavior During Therapy: WFL for tasks assessed/performed Overall Cognitive Status: Impaired/Different from baseline Area of Impairment: Attention;Memory;Following commands;Safety/judgement;Awareness;Problem solving                   Current Attention Level: Sustained Memory: Decreased recall of precautions;Decreased short-term memory Following Commands: Follows one step commands consistently;Follows one step commands with increased time;Follows multi-step commands  inconsistently Safety/Judgement: Decreased awareness of safety;Decreased awareness of deficits Awareness: Emergent Problem  Solving: Requires verbal cues General Comments: Difficulty attending to task and requires increased assistance with mobility. Follows commands with increased time      Exercises      General Comments        Pertinent Vitals/Pain Pain Assessment: No/denies pain    Home Living                      Prior Function            PT Goals (current goals can now be found in the care plan section) Acute Rehab PT Goals Patient Stated Goal: home PT Goal Formulation: With patient Time For Goal Achievement: 07/08/20 Potential to Achieve Goals: Good Progress towards PT goals: Progressing toward goals    Frequency    Min 4X/week      PT Plan Current plan remains appropriate    Co-evaluation              AM-PAC PT "6 Clicks" Mobility   Outcome Measure  Help needed turning from your back to your side while in a flat bed without using bedrails?: None Help needed moving from lying on your back to sitting on the side of a flat bed without using bedrails?: None Help needed moving to and from a bed to a chair (including a wheelchair)?: A Little Help needed standing up from a chair using your arms (e.g., wheelchair or bedside chair)?: A Little Help needed to walk in hospital room?: A Little Help needed climbing 3-5 steps with a railing? : A Lot 6 Click Score: 19    End of Session Equipment Utilized During Treatment: Gait belt Activity Tolerance: Patient tolerated treatment well Patient left: in bed;with bed alarm set;with call bell/phone within reach;with family/visitor present Nurse Communication: Mobility status PT Visit Diagnosis: Unsteadiness on feet (R26.81);Muscle weakness (generalized) (M62.81);History of falling (Z91.81);Ataxic gait (R26.0)     Time: 1696-7893 PT Time Calculation (min) (ACUTE ONLY): 24 min  Charges:  $Gait Training:  23-37 mins                     Arisbeth Purrington A. Dan Humphreys PT, DPT Acute Rehabilitation Services Pager [phone] Office [phone]    Viviann Spare 06/25/2020, 1:09 PM

## 2020-06-25 NOTE — ED Notes (Signed)
Patient family member states that he was just discharged with a stroke and that it should not take five hours to be seen.

## 2020-07-02 ENCOUNTER — Other Ambulatory Visit: Payer: Self-pay

## 2020-07-02 ENCOUNTER — Ambulatory Visit: Payer: Medicaid Other

## 2020-07-02 DIAGNOSIS — R400 Somnolence: Secondary | ICD-10-CM

## 2020-07-17 ENCOUNTER — Other Ambulatory Visit (HOSPITAL_COMMUNITY): Payer: Self-pay

## 2020-07-24 ENCOUNTER — Encounter: Payer: Self-pay | Admitting: Adult Health

## 2020-07-28 ENCOUNTER — Other Ambulatory Visit (HOSPITAL_COMMUNITY): Payer: Self-pay

## 2020-08-03 ENCOUNTER — Ambulatory Visit: Payer: Medicaid Other | Attending: Internal Medicine

## 2020-08-03 ENCOUNTER — Other Ambulatory Visit: Payer: Self-pay

## 2020-08-03 DIAGNOSIS — M545 Low back pain, unspecified: Secondary | ICD-10-CM | POA: Diagnosis present

## 2020-08-03 DIAGNOSIS — Z7409 Other reduced mobility: Secondary | ICD-10-CM | POA: Diagnosis present

## 2020-08-03 DIAGNOSIS — R262 Difficulty in walking, not elsewhere classified: Secondary | ICD-10-CM | POA: Insufficient documentation
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
## 2020-08-03 NOTE — Patient Instructions (Signed)
Access Code: [email or web address] URL: [email or web address] Date: 08/03/2020 Prepared by: Sharol Roussel  Exercises Supine Lower Trunk Rotation - 2 x daily - 7 x weekly - 1 sets - 3 reps - 20 hold Supine Posterior Pelvic Tilt - 2 x daily - 7 x weekly - 1 sets - 10 reps - 3 hold Seated Lumbar Flexion Stretch - 2 x daily - 7 x weekly - 1 sets - 3 reps - 20 hold

## 2020-08-06 ENCOUNTER — Ambulatory Visit: Payer: Medicaid Other | Admitting: Adult Health

## 2020-08-06 ENCOUNTER — Other Ambulatory Visit: Payer: Self-pay

## 2020-08-06 ENCOUNTER — Encounter: Payer: Self-pay | Admitting: Adult Health

## 2020-08-06 VITALS — BP 139/88 | HR 93 | Ht 65.0 in | Wt 148.0 lb

## 2020-08-06 DIAGNOSIS — E119 Type 2 diabetes mellitus without complications: Secondary | ICD-10-CM

## 2020-08-06 DIAGNOSIS — I69351 Hemiplegia and hemiparesis following cerebral infarction affecting right dominant side: Secondary | ICD-10-CM

## 2020-08-06 DIAGNOSIS — E785 Hyperlipidemia, unspecified: Secondary | ICD-10-CM

## 2020-08-06 DIAGNOSIS — I639 Cerebral infarction, unspecified: Secondary | ICD-10-CM | POA: Diagnosis not present

## 2020-08-06 DIAGNOSIS — I10 Essential (primary) hypertension: Secondary | ICD-10-CM

## 2020-08-06 DIAGNOSIS — R269 Unspecified abnormalities of gait and mobility: Secondary | ICD-10-CM

## 2020-08-06 DIAGNOSIS — I69398 Other sequelae of cerebral infarction: Secondary | ICD-10-CM | POA: Diagnosis not present

## 2020-08-06 NOTE — Patient Instructions (Signed)
Continue working with PT for your lower back issues and once you are ready, you can contact neuro rehab therapy to start physical and occupational therapy from a stroke perspective.  You can contact them at [phone].  If new orders are needed by the time you are ready to start, please let me know  Continue clopidogrel 75 mg daily  and atorvastatin 40 mg daily for secondary stroke prevention  Continue to follow up with PCP regarding cholesterol, blood pressure and diabetes management  Maintain strict control of hypertension with blood pressure goal below 130/90, diabetes with hemoglobin A1c goal below 7% and cholesterol with LDL cholesterol (bad cholesterol) goal below 70 mg/dL.       Followup in the future with me in 4 months or call earlier if needed       Thank you for coming to see [place] at [facility]. I hope we have been able to provide you high quality care today.  You may receive a patient satisfaction survey over the next few weeks. We would appreciate your feedback and comments so that we may continue to improve ourselves and the health of our patients.  Stroke Prevention Some medical conditions and lifestyle choices can lead to a higher risk for a stroke. You can help to prevent a stroke by eating healthy foods and exercising. It also helps to not smoke and to manage any health problems youmay have. How can this condition affect me? A stroke is an emergency. It should be treated right away. A stroke can lead to brain damage or threaten your life. There is a better chance of surviving andgetting better after a stroke if you get medical help right away. What can increase my risk? The following medical conditions may increase your risk of a stroke: Diseases of the heart and blood vessels (cardiovascular disease). High blood pressure (hypertension). Diabetes. High cholesterol. Sickle cell disease. Problems with blood clotting. Being very  overweight. Sleeping problems (obstructivesleep apnea). Other risk factors include: Being older than age 67. A history of blood clots, stroke, or mini-stroke (TIA). Race, ethnic background, or a family history of stroke. Smoking or using tobacco products. Taking birth control pills, especially if you smoke. Heavy alcohol and drug use. Not being active. What actions can I take to prevent this? Manage your health conditions High cholesterol. Eat a healthy diet. If this is not enough to manage your cholesterol, you may need to take medicines. Take medicines as told by your doctor. High blood pressure. Try to keep your blood pressure below 130/80. If your blood pressure cannot be managed through a healthy diet and regular exercise, you may need to take medicines. Take medicines as told by your doctor. Ask your doctor if you should check your blood pressure at home. Have your blood pressure checked every year. Diabetes. Eat a healthy diet and get regular exercise. If your blood sugar (glucose) cannot be managed through diet and exercise, you may need to take medicines. Take medicines as told by your doctor. Talk to your doctor about getting checked for sleeping problems. Signs of a problem can include: Snoring a lot. Feeling very tired. Make sure that you manage any other conditions you have. Nutrition  Follow instructions from your doctor about what to eat or drink. You may be told to: Eat and drink fewer calories each day. Limit how much salt (sodium) you use to 1,500 milligrams (mg) each day. Use only healthy fats for cooking, such as olive oil, canola oil,  and sunflower oil. Eat healthy foods. To do this: Choose foods that are high in fiber. These include whole grains, and fresh fruits and vegetables. Eat at least 5 servings of fruits and vegetables a day. Try to fill one-half of your plate with fruits and vegetables at each meal. Choose low-fat (lean) proteins. These include  low-fat cuts of meat, chicken without skin, fish, tofu, beans, and nuts. Eat low-fat dairy products. Avoid foods that: Are high in salt. Have saturated fat. Have trans fat. Have cholesterol. Are processed or pre-made. Count how many carbohydrates you eat and drink each day.  Lifestyle If you drink alcohol: Limit how much you have to: 0-1 drink a day for women who are not pregnant. 0-2 drinks a day for men. Know how much alcohol is in your drink. In the [place] one drink equals one 12 oz bottle of beer ( ), one 5 oz glass of wine ( ), or one 1 oz glass of hard liquor (15mL). Do not smoke or use any products that have nicotine or tobacco. If you need help quitting, ask your doctor. Avoid secondhand smoke. Do not use drugs. Activity  Try to stay at a healthy weight. Get at least 30 minutes of exercise on most days, such as: Fast walking. Biking. Swimming.  Medicines Take over-the-counter and prescription medicines only as told by your doctor. Avoid taking birth control pills. Talk to your doctor about the risks of taking birth control pills if: You are over 70 years old. You smoke. You get very bad headaches. You have had a blood clot. Where to find more information American Stroke Association: www.strokeassociation.org Get help right away if: You or a loved one has any signs of a stroke. "BE FAST" is an easy way to remember the warning signs: B - Balance. Dizziness, sudden trouble walking, or loss of balance. E - Eyes. Trouble seeing or a change in how you see. F - Face. Sudden weakness or loss of feeling of the face. The face or eyelid may droop on one side. A - Arms. Weakness or loss of feeling in an arm. This happens all of a sudden and most often on one side of the body. S - Speech. Sudden trouble speaking, slurred speech, or trouble understanding what people say. T - Time. Time to call emergency services. Write down what time symptoms started. You or a loved one  has other signs of a stroke, such as: A sudden, very bad headache with no known cause. Feeling like you may vomit (nausea). Vomiting. A seizure. These symptoms may be an emergency. Get help right away. Call your local emergency services (911 in the [place] Do not wait to see if the symptoms will go away. Do not drive yourself to the hospital. Summary You can help to prevent a stroke by eating healthy, exercising, and not smoking. It also helps to manage any health problems you have. Do not smoke or use any products that contain nicotine or tobacco. Get help right away if you or a loved one has any signs of a stroke. This information is not intended to replace advice given to you by your health care provider. Make sure you discuss any questions you have with your healthcare provider. Document Revised: 08/26/2019 Document Reviewed: 08/26/2019 Elsevier Patient Education  2022 [employer].

## 2020-08-06 NOTE — Progress Notes (Signed)
[facility] [address] [place] [id number] [phone]       HOSPITAL FOLLOW UP NOTE  Glenn [name] Date of Birth:  10/16/1960 Medical Record Number:  [id number]   Reason for Referral:  hospital stroke follow up    SUBJECTIVE:   CHIEF COMPLAINT:  Chief Complaint  Patient presents with   Follow-up    TR alone Pt is well, R side weakness/imbalance but getting better      HPI:   Glenn [name] is a 60 y.o. male with history of hypertension, type 2 diabetes mellitus, and tobacco use who presented to the ED on 06/23/2020 for evaluation of right arm and lower extremity weakness.  Personally reviewed hospitalization pertinent progress notes, lab work and imaging with summary provided.  Evaluated by Dr. Roda ShuttersXu with stroke work-up revealing acute/subacute infarct within the posterior limb of left internal capsule likely secondary to small vessel disease.  HTN stable and resumed home meds.  LDL 135 and initiate atorvastatin 40 mg daily.  Controlled DM with A1c 6.8.  Recent tobacco use quitting 6 weeks prior with 17-pack-year history.  UDS positive for benzos.  Other stroke risk factors include history of stroke 2010.  Multiple other active problems including COPD, arthritis, asthma, chronic pain, anxiety and GERD.  PT/OT recommended CIR although patient declined and wished to go home with outpatient physical therapy.   Stroke: acute/subacute infarct within the posterior limb of the left internal capsule likely small vessel disease CT head: chronic small vessel disease.  MRI:  acute/subacute infarct within the posterior limb of the left internal capsule.       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 MRA Unremarkable, no LVO Carotid Doppler: 1-39% stenosis 2D Echo EF 55 to 60% LDL 135 HgbA1c 6.8 VTE prophylaxis - SCDs Diet: heart healthy/carb modified aspirin 81 mg daily prior to admission, now on aspirin 81 mg daily and  clopidogrel 75 mg daily. DAPT with aspirin and plavix for 3 weeks then plavix alone. Therapy recommendations:  CIR --> OP PT Disposition:  home  Today, 08/06/2020, Glenn [name] is being seen for hospital stroke follow up unaccompanied.  He reports he has been doing well since discharge with residual right-sided weakness and imbalance with ongoing improvement.  He is currently ambulating with a cane and denies any recent falls.  He is currently working with PT for chronic low back pain but has not yet been seen by neuro rehab PT/OT for residual stroke deficits although he is interested in seeing them once low back pain PT completed.  Has remained on Plavix and atorvastatin without associated side effects.  Blood pressure today 139/88.  Monitors glucose levels at home which have been stable.  No concerns at this time.     ROS:   14 system review of systems performed and negative with exception of those listed in HPI  PMH:  Past Medical History:  Diagnosis Date   Arthritis    Asthma    Chronic pain 05/06/2020   COPD (chronic obstructive pulmonary disease) (HCC)    CVA (cerebral vascular accident) (HCC)    2010; 06/2020   Diabetes mellitus type 2 in nonobese [facility](HCC)    ED (erectile dysfunction)    Hypertension    Tobacco dependence 05/06/2020    PSH:  Past Surgical History:  Procedure Laterality Date   LUNG SURGERY     removed tissue    Social History:  Social History   Socioeconomic History   Marital  status: Married    Spouse name: Not on file   Number of children: Not on file   Years of education: Not on file   Highest education level: Not on file  Occupational History   Occupation: disabled  Tobacco Use   Smoking status: Former    Packs/day: 0.50    Years: 34.00    Pack years: 17.00    Types: Cigarettes    Quit date: 05/11/2020    Years since quitting: 0.2   Smokeless tobacco: Never   Tobacco comments:    at the most smoked 6-7 cigarettes/day  Vaping Use   Vaping Use:  Never used  Substance and Sexual Activity   Alcohol use: Never   Drug use: Never   Sexual activity: Not on file  Other Topics Concern   Not on file  Social History Narrative   Not on file   Social Determinants of Health   Financial Resource Strain: Not on file  Food Insecurity: Not on file  Transportation Needs: Not on file  Physical Activity: Not on file  Stress: Not on file  Social Connections: Not on file  Intimate Partner Violence: Not on file    Family History:  Family History  Problem Relation Age of Onset   Hypertension Mother    Diabetes Maternal Grandmother    Colon cancer Neg Hx    Esophageal cancer Neg Hx    Rectal cancer Neg Hx    Inflammatory bowel disease Neg Hx    Liver disease Neg Hx    Pancreatic cancer Neg Hx     Medications:   Current Outpatient Medications on File Prior to Visit  Medication Sig Dispense Refill   albuterol (PROVENTIL HFA) 108 (90 Base) MCG/ACT inhaler Inhale 2 puffs into the lungs every 4 (four) hours as needed for wheezing or shortness of breath. 1 g 11   albuterol (PROVENTIL) (2.5 MG/3ML) 0.083% nebulizer solution PLEASE SEE ATTACHED FOR DETAILED DIRECTIONS (Patient taking differently: Take 2.5 mg by nebulization every 6 (six) hours as needed for wheezing or shortness of breath.) 150 mL 2   AMITIZA 24 MCG capsule Take 24 mcg by mouth at bedtime.     amLODipine (NORVASC) 10 MG tablet Take 10 mg by mouth daily.     atorvastatin (LIPITOR) 40 MG tablet Take 1 tablet (40 mg total) by mouth daily. 30 tablet 2   benzonatate (TESSALON) 100 MG capsule Take 2 capsules (200 mg total) by mouth every 8 (eight) hours as needed for cough. 20 capsule 0   budesonide-formoterol (SYMBICORT) 160-4.5 MCG/ACT inhaler Inhale 2 puffs into the lungs 2 (two) times daily. 1 each 11   cetirizine (ZYRTEC ALLERGY) 10 MG tablet Take 1 tablet (10 mg total) by mouth at bedtime. 30 tablet 6   clopidogrel (PLAVIX) 75 MG tablet Take 1 tablet (75 mg total) by mouth  daily. 90 tablet 3   gabapentin (NEURONTIN) 300 MG capsule Take 1 capsule (300 mg total) by mouth 3 (three) times daily. 50 capsule 0   hydrochlorothiazide (HYDRODIURIL) 12.5 MG tablet Take 12.5 mg by mouth daily.     hydrOXYzine (ATARAX/VISTARIL) 25 MG tablet Take 25 mg by mouth 2 (two) times daily as needed for anxiety.  unk   metaxalone (SKELAXIN) 800 MG tablet Take 1 tablet (800 mg total) by mouth 3 (three) times daily. (Patient taking differently: Take 800 mg by mouth 3 (three) times daily as needed for muscle spasms.) 21 tablet 0   metFORMIN (GLUCOPHAGE) 500 MG tablet Take 500  mg by mouth every morning.     montelukast (SINGULAIR) 10 MG tablet Take 1 tablet (10 mg total) by mouth at bedtime. 30 tablet 6   Multiple Vitamin (MULTIVITAMIN WITH MINERALS) TABS tablet Take 1 tablet by mouth daily. 30 tablet 0   nicotine (NICODERM CQ) 14 mg/24hr patch Place 1 patch (14 mg total) onto the skin daily. 28 patch 6   nitroGLYCERIN (NITROSTAT) 0.4 MG SL tablet Place 0.4 mg under the tongue every 5 (five) minutes as needed for chest pain.     Oxycodone HCl 20 MG TABS Take 15 mg by mouth every 6 (six) hours as needed for pain.     pantoprazole (PROTONIX) 40 MG tablet Take 1 tablet (40 mg total) by mouth daily. 30 tablet 0   sildenafil (VIAGRA) 100 MG tablet Take 1 tablet (100 mg total) by mouth daily as needed. (Patient taking differently: Take 100 mg by mouth daily as needed for erectile dysfunction.) 30 tablet 5   tamsulosin (FLOMAX) 0.4 MG CAPS capsule Take 0.4 mg by mouth every evening.     Tiotropium Bromide Monohydrate (SPIRIVA RESPIMAT) 2.5 MCG/ACT AERS 2 puffs each am 1 each 11   vitamin B-12 1000 MCG tablet Take 1 tablet (1,000 mcg total) by mouth daily. 30 tablet 0   Vitamin D, Ergocalciferol, (DRISDOL) 1.25 MG (50000 UNIT) CAPS capsule Take 50,000 Units by mouth once a week. Mondays     ferrous sulfate 325 (65 FE) MG tablet Take 1 tablet (325 mg total) by mouth daily with breakfast. 30 tablet 0    No current facility-administered medications on file prior to visit.    Allergies:   Allergies  Allergen Reactions   Insulins Swelling   Ibuprofen     Abdominal sensitivity   Shrimp [Shellfish Allergy] Itching   Tylenol [Acetaminophen]     Abdominal sensitvity   Lisinopril Rash    Tongue swelling      OBJECTIVE:  Physical Exam  Vitals:   08/06/20 0955  BP: 139/88  Pulse: 93  Weight: 148 lb (67.1 kg)  Height: 5\' 5"  (1.651 m)   Body mass index is 24.63 kg/m. No results found.  Post stroke PHQ 2/9 Depression screen PHQ 2/9 08/06/2020  Decreased Interest 0  Down, Depressed, Hopeless 0  PHQ - 2 Score 0     General: well developed, well nourished, pleasant middle-aged African-American male, seated, in no evident distress Head: head normocephalic and atraumatic.   Neck: supple with no carotid or supraclavicular bruits Cardiovascular: regular rate and rhythm, no murmurs Musculoskeletal: no deformity Skin:  no rash/petichiae Vascular:  Normal pulses all extremities   Neurologic Exam Mental Status: Awake and fully alert.  Fluent speech and language.  Oriented to place and time. Recent and remote memory intact. Attention span, concentration and fund of knowledge appropriate. Mood and affect appropriate.  Cranial Nerves: Fundoscopic exam reveals sharp disc margins. Pupils equal, briskly reactive to light. Extraocular movements full without nystagmus. Visual fields full to confrontation. Hearing intact. Facial sensation intact. Face, tongue, palate moves normally and symmetrically.  Motor: Normal bulk and tone. Normal strength in all tested extremity muscles except slight decrease right hand dexterity and right hip flexor weakness Sensory.: intact to touch , pinprick , position and vibratory sensation.  Coordination: Rapid alternating movements normal in all extremities except slightly decreased right hand. Finger-to-nose and heel-to-shin performed accurately bilaterally.   Mildly orbits Dr. 08/08/2020 over right arm Gait and Station: Arises from chair without difficulty. Stance is normal. Gait demonstrates  normal stride length and mild imbalance with use of cane.  Reflexes: 1+ and symmetric. Toes downgoing.     NIHSS  0 Modified Rankin  2      ASSESSMENT: Glenn [name] is a 60 y.o. year old male with recent acute/subacute infarct in posterior limb of the left internal capsule on 06/23/2020 likely secondary to small vessel disease given location and risk factors.  Vascular risk factors include HTN, HLD, DM, recent tobacco use and prior stroke history in 2010.      PLAN:  L PLIC stroke :  Residual deficit: Mild right hemiparesis and gait impairment with imbalance.  Currently working with PT for low back pain and wishes to start neuro rehab PT/OT once completed -he was advised to contact office if new orders are needed once he is ready to begin Continue clopidogrel 75 mg daily  and atorvastatin 40 mg daily for secondary stroke prevention.   Discussed secondary stroke prevention measures and importance of close PCP follow up for aggressive stroke risk factor management  HTN: BP goal <130/90.  Stable on current regimen per PCP HLD: LDL goal <70. Recent LDL 135 -continue atorvastatin 40 mg daily.  Reports routine lab work by PCP DMII: A1c goal<7.0. Recent A1c 6.8 on glipizide and metformin.  Tobacco use: Endorses continued cessation    Follow up in 4 months or call earlier if needed   CC:  [facility] provider: Dr. Pearlean Brownie PCP: Fleet Contras, MD    I spent 43 minutes of face-to-face and non-face-to-face time with patient.  This included previsit chart review including recent hospitalization, lab review, study review, order entry, electronic health record documentation, patient education regarding recent stroke including etiology as well as secondary stroke prevention measures and aggressive stroke risk factor management, residual deficits and typical recovery time and  answered all questions to patient satisfaction   Ihor Austin, AGNP-BC  [facility] [address] [place], [place] [address]  Phone [phone] Fax [phone] Note: This document was prepared with digital dictation and possible smart phrase technology. Any transcriptional errors that result from this process are unintentional.

## 2020-08-07 ENCOUNTER — Other Ambulatory Visit (HOSPITAL_COMMUNITY)
Admission: RE | Admit: 2020-08-07 | Discharge: 2020-08-07 | Disposition: A | Discharge status: Home / Self Care | Payer: Medicaid Other | Source: Ambulatory Visit | Attending: Internal Medicine | Admitting: Internal Medicine

## 2020-08-07 DIAGNOSIS — Z20822 Contact with and (suspected) exposure to covid-19: Secondary | ICD-10-CM | POA: Diagnosis not present

## 2020-08-07 DIAGNOSIS — Z01812 Encounter for preprocedural laboratory examination: Secondary | ICD-10-CM | POA: Diagnosis present

## 2020-08-07 LAB — SARS CORONAVIRUS 2 (TAT 6-24 HRS): SARS Coronavirus 2: NEGATIVE

## 2020-08-07 NOTE — Progress Notes (Signed)
I agree with the above plan 

## 2020-08-11 ENCOUNTER — Ambulatory Visit (INDEPENDENT_AMBULATORY_CARE_PROVIDER_SITE_OTHER): Payer: Medicaid Other | Admitting: Internal Medicine

## 2020-08-11 ENCOUNTER — Encounter: Payer: Self-pay | Admitting: Internal Medicine

## 2020-08-11 ENCOUNTER — Other Ambulatory Visit: Payer: Self-pay

## 2020-08-11 ENCOUNTER — Encounter: Payer: Self-pay | Admitting: *Deleted

## 2020-08-11 DIAGNOSIS — J449 Chronic obstructive pulmonary disease, unspecified: Secondary | ICD-10-CM

## 2020-08-11 LAB — PULMONARY FUNCTION TEST
DL/VA % pred: 78 %
DL/VA: 3.41 ml/min/mmHg/L
DLCO unc % pred: 52 %
DLCO unc: 11.94 ml/min/mmHg
FEF 25-75 Post: 0.8 L/sec
FEF 25-75 Pre: 0.61 L/sec
FEF2575-%Change-Post: 31 %
FEF2575-%Pred-Post: 33 %
FEF2575-%Pred-Pre: 25 %
FEV1-%Change-Post: 10 %
FEV1-%Pred-Post: 54 %
FEV1-%Pred-Pre: 49 %
FEV1-Post: 1.34 L
FEV1-Pre: 1.21 L
FEV1FVC-%Change-Post: 2 %
FEV1FVC-%Pred-Pre: 76 %
FEV6-%Change-Post: 8 %
FEV6-%Pred-Post: 72 %
FEV6-%Pred-Pre: 66 %
FEV6-Post: 2.19 L
FEV6-Pre: 2.02 L
FEV6FVC-%Change-Post: 0 %
FEV6FVC-%Pred-Post: 104 %
FEV6FVC-%Pred-Pre: 104 %
FVC-%Change-Post: 8 %
FVC-%Pred-Post: 69 %
FVC-%Pred-Pre: 64 %
FVC-Post: 2.19 L
FVC-Pre: 2.03 L
Post FEV1/FVC ratio: 61 %
Post FEV6/FVC ratio: 100 %
Pre FEV1/FVC ratio: 60 %
Pre FEV6/FVC Ratio: 100 %
RV % pred: 47 %
RV: 0.91 L
TLC % pred: 53 %
TLC: 3.1 L

## 2020-08-11 NOTE — Assessment & Plan Note (Addendum)
Quit smoking 05/2020    - Spirometry 10/05/2016  FEV1 1.36 (52%)  Ratio 65 p saba with classic curvature - 10/05/2016  > try dulera 200 2bid   - Allergy profile 11/22/2017 >  Eos 0.6 /  IgE  25 RAST neg x for roaches/ neg for mold  - 11/22/2017   continue dulera 200 2bid  - PFT's  02/08/2018  FEV1 0.90 (36 % ) ratio 59   p multiple saba prior to study with DLCO  52 % corrects to 106  % for alv volume   02/08/2018  After extensive coaching inhaler device,  effectiveness =    90% from a baseline of 75% with smi so try spiriva respimat - 08/14/2018    changed to trelegy but preferred symb/spriva - 11/20/2018     Breztri > better but not covered by medicaid - 03/26/2019  After extensive coaching inhaler device,  effectiveness =    75% (short Ti) > rechallenge with Breztri > not covered, resumed symb 160 but only took prn - 01/15/2020 re-started on symb 160/spiriva 2.5 daily  - 08/11/2020  After extensive coaching inhaler device,  effectiveness =    75% > continue symbicort/ spiriva - PFT's  08/11/2020  FEV1 1.34 (54 % ) ratio 0.61  p 10 % improvement from saba p spiriva prior to study with DLCO  11.94 (52%) corrects to 3.41 (77%)  for alv volume and FV curve classic concavity     Group D in terms of symptom/risk and laba/lama/ICS  therefore appropriate rx at this point >>>  Continue symb/ spiriva and approp saba  Try albuterol 15 min before an activity (on alternating days)  that you know would make you short of breath and see if it makes any difference and if makes none then don't take albuterol after activity unless you can't catch your breath as this means it's the resting that helps, not the albuterol.   F/u in 3 m with all meds in hand using a trust but verify approach to confirm accurate Medication  Reconciliation The principal here is that until we are certain that the  patients are doing what we've asked, it makes no sense to ask them to do more.              Each maintenance medication was reviewed in  detail including emphasizing most importantly the difference between maintenance and prns and under what circumstances the prns are to be triggered using an action plan format where appropriate.  Total time for H and P, chart review, counseling, reviewing hfa/dpi device(s) and generating customized AVS unique to this office visit / same day charting = 32 min

## 2020-08-11 NOTE — Progress Notes (Signed)
Subjective:     Patient ID: Glenn [name], male   DOB: [birth year]     MRN: [id number]      Brief patient profile:  59 yobm quit smoking 05/2020   with onset of symptoms in 1995 while in [place] dx as asthma vs sarcoid and rx pred/ theoph / albuterol seen by allergist dx as pollen moved to [place] where underwent bilateral lung surgery 2011 ? Lung vol reduction ? Helped breathing at the time but uses [facility] parking and  [facility] = can't walk a nl pace on a flat grade s sob but does fine slow and flat so referred to pulmonary clinic 10/05/2016 by Dr  Tyson Dense      History of Present Illness  10/05/2016 1st [facility] office visit/ Glenn [name]   Chief Complaint  Patient presents with   Pulmonary Consult    Referred by Dr. Alveta Heimlich for eval of Asthma and COPD. Pt states he was dxed with COPD and Asthma in 1995. He moved here a year ago from [place], [place]. He states his breathing is "okay" today. He states he has a "slight cold"- prod cough with minimal brown sputum.    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  Work on Printmaker B = Backup Only use your albuterol (proair) as a rescue medication Plan C = Crisis - only use your albuterol nebulizer if you first try Plan B and it fails to help > ok to use the nebulizer up to every 4 hours but if start needing it regularly call for immediate appointment Please schedule a follow up office visit in 6 weeks, call sooner if needed with pfts     11/22/2017  f/u ov/Glenn [name] re:  GOLD II/ still  smoking  Chief Complaint  Patient presents with   Follow-up    Breathing is overall doing okay. He is wheezing and has occ non prod cough. He is using his albuterol inhaler and neb both 3-4 x daily.   Dyspnea:  MMRC2 = can't walk a nl pace on a flat grade s sob but does fine slow and flat  Cough: mostly dry, worse in house where he thinks there is mold Sleeping: some wheezing noct/ freq saba noct rec Prednisone 10 mg take  4 each am x 2 days,   2 each am x 2 days,  1 each am x 2 days and stop  Plan A = Automatic = dulera 200 Take 2 puffs first thing in am and then another 2 puffs about 12 hours later.  Work on inhaler technique: Plan B = Backup Only use your albuterol (proair) as a rescue medication  Plan C = Crisis - only use your albuterol nebulizer if you first try Plan B  The key is to stop smoking completely before smoking completely stops you!      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  Dyspnea:  MMRC2 = can't walk a nl pace on a flat grade s sob but does fine slow and flat / worse with cold x one week Cough: more with cold, not producing any mucus  Sleeping: 2 pillows, bed flat SABA use: when not having a cold  Maybe once a day 02: none  rec Prednisone 10 mg take  4 each am x 2 days,   2 each am x 2 days,  1 each am x 2 days and stop  Plan A = Automatic = dulera 200 Take 2 puffs first thing in am and then another 2 puffs about 12 hours later  And spiriva 2 puffs each am  Work on inhaler technique: Plan B = Backup Only use your albuterol (proair= Ventolin)  as a rescue medication Plan C = Crisis - only use your albuterol nebulizer if you first try Plan B and it fails to help > ok to use the nebulizer up to every 4 hours but if start needing it regularly call for immediate appointment The key is to stop smoking completely before  smoking completely stops you!    08/14/2018  f/u ov/Glenn [name] re: GOLD II/ active smoking/ on dulera 200/ spiriva  Chief Complaint  Patient presents with   Follow-up    Breathing is some worse with hot/humid weather. He states he does not have a rescue inhaler. He uses his neb 3 x daily on average.   Dyspnea:  MMRC2 = can't walk a nl pace on a flat grade s sob but does fine slow and flat  Cough: min mucoid esp in am Sleeping: bed is flat, one pillow / rarely wakes up needs neb  SABA use: way too much neb saba ,no hfa on hand  02: none  rec Plan A = Automatic = trelegy one click each am, take two good drags  Plan B = Backup Only use your albuterol inhaler as a rescue medication t Plan C = Crisis - only use your albuterol nebulizer if you first try Plan B and it fails to help > ok to use the nebulizer up to every 4 hours but if start needing it regularly call for immediate appointment Prednisone 10 mg take  4 each am x 2 days,   2 each am x 2 days,  1 each am x 2 days and stop  Please schedule a follow up visit in 3 months but call sooner if needed  with all medications /inhalers/ solutions in hand so we can verify exactly what you are taking. This includes all medications from all doctors and over the counters Add: cxr on return     11/20/2018  f/u ov/Glenn [name] re:  Copd/ still smoking and meds empty Chief Complaint  Patient presents with   Follow-up    Breathing is "okay" but he is using his nebulizer with albuterol 3-4 x per day. He came in today with a empty Dulera inhaler and states this is what he has been using.   Dyspnea:  MMRC2 = can't walk a nl pace on a flat grade s sob but does fine slow and flat Cough: no  Sleeping: ok flat  SABA use: as above -was much less until ran out of maint rx  02: none  rec  Plan A = Automatic = Breztri Take 2 puffs first thing in am and then another 2 puffs about 12 hours later.  Work on inhaler technique If not happy, resume dulera and spiriva like  before  Plan B = Backup Only use your albuterol inhaler as a rescue medication Plan C = Crisis - only use your albuterol nebulizer if you first try Plan B and it fails to help > ok to use the nebulizer up to every 4 hours but if start needing it regularly call for immediate appointment Prednisone 10 mg take  4 each am x 2 days,   2 each am x 2 days,  1 each am x 2 days and stop  The key is to stop smoking completely before smoking completely stops you! Please schedule a follow up office visit in 4 weeks, sooner if needed  with all medications /inhalers/ solutions in hand so we can verify exactly what you are taking. This includes all medications from all doctors and over the counters   03/26/2019  f/u ov/Glenn [name] re:  COPD II/still still smoker / did not bring meds but only on dulera 200 now "when he can get it"  Chief Complaint  Patient presents with   Follow-up    Pt c/o non prod cough and wheezing x 2 wks. He is using his albuterol inhaler and neb both about 4 x per day.   Dyspnea: highly variable / not consistent with meds Cough: better/ sporadic and dry day > noct   Sleeping: at least once a night wakes up sob / wheezing / uses saba SABA use: way too much  02: none  Prednisone does help all his symptoms including the cough but not really clear it reduces his saba dependency  rec Plan A = Automatic = Always=    Breztri Take 2 puffs first thing in am and then another 2 puffs about 12 hours later.  Work on inhaler technique:  Plan B = Backup (to supplement plan A, not to replace it) Only use your albuterol inhaler as a rescue medication Plan C = Crisis (instead of Plan B but only if Plan B stops working) - only use your albuterol nebulizer if you first try Plan B and it fails to help > ok to use the nebulizer up to every 4 hours but if start needing it regularly call for immediate appointment Prednisone 10 mg take  4 each am x 2 days,   2 each am x 2 days,  1 each am x 2 days and stop  The  key is to stop smoking completely before smoking completely stops you! Try albuterol 15 min before an activity that you know would make you short of breath   Please schedule a follow up office visit in 6 weeks, call sooner if needed with all medications /inhalers/ solutions in hand so we can verify exactly what you are taking. This includes all medications from all doctors and over the counters     01/15/2020  f/u ov/Glenn [name] re:  Copd II / still smoking / no really on maint symb/ doesn't remember rx with spiriva previously  Chief Complaint  Patient presents with   Follow-up    Breathing is overall doing well. He states that he uses his albuterol inhaler about every 4 hours and uses neb every night.   Dyspnea:  MMRC2 = can't walk a nl pace on a flat grade s sob but does fine slow and flat  Cough: none  Sleeping:  3 h p lie down needs nebulizer  SABA use: way too much /totally confused on maint vs prns 02: none  Prednisone always helps and when it does he tends to stop everything not understanding the difference between maint and prns Rec Plan A = Automatic = Always=    symbicort 160 Take 2 puffs first thing in am and then another 2 puffs about 12 hours later and spiriva 2 pffs each am  Prednisone 10 mg take  4 each am x 2 days,   2 each am x 2 days,  1 each am x 2 days and stop   Plan B = Backup (to supplement plan A, not to replace it) Only use your albuterol inhaler as a rescue medication Plan C = Crisis (instead of Plan B but only if Plan B stops working) - only use your albuterol nebulizer if you first try Plan B and it fails to help > ok to use the nebulizer up to every 4 hours but if start needing it regularly call for immediate appointment The key is to stop smoking completely before smoking completely stops you! I very strongly recommend you get the moderna or pfizer vaccine  If you are satisfied with your treatment plan,  let your doctor know and he/she can either refill your medications  or you can return here when your prescription runs out but you must bring all active medications with you if you want my help.  NP recs  06/09/20   HST/pfts   08/11/2020  f/u ov/Glenn [name] re: COPD II/ bronchiectasis with remote lung resection  Chief Complaint  Patient presents with   Follow-up    Go over PFT. Patient denies any concerns,     Dyspnea:  MMRC1 = can walk nl pace, flat grade, can't hurry or go uphills or steps s sob   Cough: none  Sleeping: no resp cc SABA use: confused  02: none  Covid status:   never vax /  never infected    No obvious day to day or daytime variability or assoc excess/ purulent sputum or mucus plugs or hemoptysis or cp or chest tightness, subjective wheeze or overt sinus or hb symptoms.   sleeping without nocturnal  or early am exacerbation  of respiratory  c/o's or need for noct saba. Also denies any obvious fluctuation of symptoms with weather or environmental changes or other aggravating or alleviating factors except as outlined above   No unusual exposure hx or h/o childhood pna/ asthma or knowledge of premature birth.  Current Allergies, Complete Past Medical History, Past Surgical History, Family History, and Social History were reviewed in [employer] record.  ROS  The following are not active complaints unless bolded Hoarseness, sore throat, dysphagia, dental problems, itching, sneezing,  nasal congestion or discharge of excess mucus or purulent secretions, ear ache,   fever, chills, sweats, unintended wt loss or wt gain, classically pleuritic or exertional cp,  orthopnea pnd or arm/hand swelling  or leg swelling, presyncope, palpitations, abdominal pain, anorexia, nausea, vomiting, diarrhea  or change in bowel habits or change in bladder habits, change in stools or change in urine, dysuria, hematuria,  rash, arthralgias, visual complaints, headache, numbness, weakness or ataxia or problems with walking or coordination,  change in  mood or  memory.        Current Meds  Medication Sig   albuterol (PROVENTIL HFA) 108 (90 Base) MCG/ACT inhaler Inhale 2 puffs into the lungs every 4 (four) hours as needed  for wheezing or shortness of breath.   albuterol (PROVENTIL) (2.5 MG/3ML) 0.083% nebulizer solution PLEASE SEE ATTACHED FOR DETAILED DIRECTIONS (Patient taking differently: Take 2.5 mg by nebulization every 6 (six) hours as needed for wheezing or shortness of breath.)   AMITIZA 24 MCG capsule Take 24 mcg by mouth at bedtime.   amLODipine (NORVASC) 10 MG tablet Take 10 mg by mouth daily.   atorvastatin (LIPITOR) 40 MG tablet Take 1 tablet (40 mg total) by mouth daily.   benzonatate (TESSALON) 100 MG capsule Take 2 capsules (200 mg total) by mouth every 8 (eight) hours as needed for cough.   budesonide-formoterol (SYMBICORT) 160-4.5 MCG/ACT inhaler Inhale 2 puffs into the lungs 2 (two) times daily.   cetirizine (ZYRTEC ALLERGY) 10 MG tablet Take 1 tablet (10 mg total) by mouth at bedtime.   clopidogrel (PLAVIX) 75 MG tablet Take 1 tablet (75 mg total) by mouth daily.   gabapentin (NEURONTIN) 300 MG capsule Take 1 capsule (300 mg total) by mouth 3 (three) times daily.   hydrochlorothiazide (HYDRODIURIL) 12.5 MG tablet Take 12.5 mg by mouth daily.   hydrOXYzine (ATARAX/VISTARIL) 25 MG tablet Take 25 mg by mouth 2 (two) times daily as needed for anxiety.   metaxalone (SKELAXIN) 800 MG tablet Take 1 tablet (800 mg total) by mouth 3 (three) times daily. (Patient taking differently: Take 800 mg by mouth 3 (three) times daily as needed for muscle spasms.)   metFORMIN (GLUCOPHAGE) 500 MG tablet Take 500 mg by mouth every morning.   montelukast (SINGULAIR) 10 MG tablet Take 1 tablet (10 mg total) by mouth at bedtime.   Multiple Vitamin (MULTIVITAMIN WITH MINERALS) TABS tablet Take 1 tablet by mouth daily.   nicotine (NICODERM CQ) 14 mg/24hr patch Place 1 patch (14 mg total) onto the skin daily.   nitroGLYCERIN (NITROSTAT) 0.4 MG SL  tablet Place 0.4 mg under the tongue every 5 (five) minutes as needed for chest pain.   Oxycodone HCl 20 MG TABS Take 20 mg by mouth every 6 (six) hours as needed for pain.   pantoprazole (PROTONIX) 40 MG tablet Take 1 tablet (40 mg total) by mouth daily.   sildenafil (VIAGRA) 100 MG tablet Take 1 tablet (100 mg total) by mouth daily as needed. (Patient taking differently: Take 100 mg by mouth daily as needed for erectile dysfunction.)   tamsulosin (FLOMAX) 0.4 MG CAPS capsule Take 0.4 mg by mouth every evening.   Tiotropium Bromide Monohydrate (SPIRIVA RESPIMAT) 2.5 MCG/ACT AERS 2 puffs each am   vitamin B-12 1000 MCG tablet Take 1 tablet (1,000 mcg total) by mouth daily.   Vitamin D, Ergocalciferol, (DRISDOL) 1.25 MG (50000 UNIT) CAPS capsule Take 50,000 Units by mouth once a week. Mondays                         Objective:   Physical Exam    08/11/2020          144 01/15/2020        140 03/26/2019        144  11/20/2018      144  08/14/2018          144  02/08/2018          143  11/22/17 142 lb (64.4 kg)  10/05/16 131 lb 12.8 oz (59.8 kg)     Vital signs reviewed  08/11/2020  - Note at rest 02 sats  97% on RA   General appearance:  amb bm nad    HEENT : pt wearing mask not removed for exam due to covid -19 concerns.    NECK :  without JVD/Nodes/TM/ nl carotid upstrokes bilaterally   LUNGS: no acc muscle use,  Mod barrel  contour chest wall with bilateral  Distant bs esp L base s audible wheeze and  without cough on insp or exp maneuvers and mod  Hyperresonant  to  percussion bilaterally     CV:  RRR  no s3 or murmur or increase in P2, and no edema   ABD:  soft and nontender with pos mid insp Hoover's  in the supine position. No bruits or organomegaly appreciated, bowel sounds nl  MS:     ext warm without deformities, calf tenderness, cyanosis or clubbing No obvious joint restrictions   SKIN: warm and dry without lesions    NEURO:  alert, approp, nl sensorium with   no motor or cerebellar deficits apparent.          I personally reviewed images and agree with radiology impression as follows:  CXR:   05/23/20 Mild elevation of the left hemidiaphragm. Scarring in the lungs bilaterally. No definite acute confluent opacity or effusion. Heart is normal size.      Assessment:

## 2020-08-11 NOTE — Patient Instructions (Addendum)
Plan A = Automatic = Always=    symbicort 160 Take 2 puffs first thing in am and then another 2 puffs about 12 hours later and spiriva 2 pffs each am   Work on inhaler technique:  relax and gently blow all the way out then take a nice smooth full deep breath back in, triggering the inhaler at same time you start breathing in.  Hold for up to 5 seconds if you can. Blow  symbicort out thru nose. Rinse and gargle with water when done.  If mouth or throat bother you at all,  try brushing teeth/gums/tongue with arm and hammer toothpaste/ make a slurry and gargle and spit out.      Plan B = Backup (to supplement plan A, not to replace it) Only use your albuterol inhaler as a rescue medication to be used if you can't catch your breath by resting or doing a relaxed purse lip breathing pattern.  - The less you use it, the better it will work when you need it. - Ok to use the inhaler up to 2 puffs  every 4 hours if you must but call for appointment if use goes up over your usual need - Don't leave home without it !!  (think of it like the spare tire for your car)   Plan C = Crisis (instead of Plan B but only if Plan B stops working) - only use your albuterol nebulizer if you first try Plan B and it fails to help > ok to use the nebulizer up to every 4 hours but if start needing it regularly call for immediate appointment     I very strongly recommend you get the moderna or pfizer vaccine as soon as possible based on your risk of dying from the virus  and the proven safety and benefit of these vaccines against even the delta variant.  This can save your life as well as  those of your loved ones,  especially if they are also not vaccinated.     Please schedule a follow up visit in 3 months but call sooner if needed  with all medications /inhalers/ solutions in hand so we can verify exactly what you are taking. This includes all medications from all doctors and over the counters

## 2020-08-11 NOTE — Progress Notes (Signed)
PFT done today. 

## 2020-08-12 ENCOUNTER — Other Ambulatory Visit: Payer: Self-pay

## 2020-08-12 ENCOUNTER — Encounter: Payer: Self-pay | Admitting: Physical Therapy

## 2020-08-12 ENCOUNTER — Ambulatory Visit: Payer: Medicaid Other | Attending: Internal Medicine | Admitting: Physical Therapy

## 2020-08-12 DIAGNOSIS — R262 Difficulty in walking, not elsewhere classified: Secondary | ICD-10-CM | POA: Insufficient documentation

## 2020-08-12 DIAGNOSIS — M545 Low back pain, unspecified: Secondary | ICD-10-CM | POA: Diagnosis present

## 2020-08-12 DIAGNOSIS — Z7409 Other reduced mobility: Secondary | ICD-10-CM | POA: Diagnosis present

## 2020-08-12 NOTE — Therapy (Signed)
[facility] [address] [place], [place], [address] Phone: [phone]   Fax:  [phone]  Physical Therapy Treatment  Patient Details  Name: Glenn [name] MRN: [id number] Date of Birth: November 09, 1960 Referring Provider (PT): Fleet Contras, MD   Encounter Date: 08/12/2020   PT End of Session - 08/12/20 1353     Visit Number 2    Number of Visits 16    Date for PT Re-Evaluation 09/28/20    Authorization Type MCD [facility]    PT Start Time 1148    PT Stop Time 1217    PT Time Calculation (min) 29 min             Past Medical History:  Diagnosis Date   Arthritis    Asthma    Chronic pain 05/06/2020   COPD (chronic obstructive pulmonary disease) (HCC)    CVA (cerebral vascular accident) (HCC)    2010; 06/2020   Diabetes mellitus type 2 in nonobese [facility])    ED (erectile dysfunction)    Hypertension    Tobacco dependence 05/06/2020    Past Surgical History:  Procedure Laterality Date   LUNG SURGERY     removed tissue    There were no vitals filed for this visit.   Subjective Assessment - 08/12/20 1152     Subjective Patient reports good compliance with HEP.    Limitations Sitting;Lifting;House hold activities    Patient Stated Goals Better way to deal with his daily activities.  Be able to deal with the pain better.    Currently in Pain? Yes    Pain Score 7     Pain Location Back    Pain Orientation Posterior    Pain Descriptors / Indicators Sharp;Throbbing    Pain Type Chronic pain                [facility] PT Assessment - 08/12/20 0001       Assessment   Medical Diagnosis deg disease of lumbar spine    Referring Provider (PT) Fleet Contras, MD      Precautions   Precautions None      Restrictions   Weight Bearing Restrictions No      6 Minute Walk- Baseline   6 Minute Walk- Baseline yes    HR (bpm) 89    02 Sat (%RA) 95 %    Modified Borg Scale for Dyspnea 0- Nothing at all    Perceived Rate  of Exertion (Borg) 6-      6 Minute walk- Post Test   6 Minute Walk Post Test yes    HR (bpm) 110    02 Sat (%RA) 89 %    Modified Borg Scale for Dyspnea 1- Very mild shortness of breath    Perceived Rate of Exertion (Borg) 13- Somewhat hard      6 minute walk test results    Aerobic Endurance Distance Walked 1300                           [facility] Adult PT Treatment/Exercise - 08/12/20 0001       Lumbar Exercises: Stretches   Other Lumbar Stretch Exercise Seated ball roll out FWD x10 with increased pain      Lumbar Exercises: Standing   Other Standing Lumbar Exercises Repeated lumbar extension with increasing pain and rad symptoms.      Lumbar Exercises: Seated   Other Seated Lumbar Exercises Pallof Press BLUE x10 ea  dir      Lumbar Exercises: Supine   Bridge 10 reps    Bridge with [employer] 10 reps   reports pain at end of 10 reps     Lumbar Exercises: Sidelying   Clam 10 reps;Both    Clam Limitations GREEN 2sets    Hip Abduction 10 reps;Both                    PT Education - 08/12/20 1347     Education Details Contiue current HEP, decrease range or stop if pain increases so not to repeatedly irritate.    Person(s) Educated Patient    Methods Explanation    Comprehension Verbalized understanding              PT Short Term Goals - 08/12/20 1355       PT SHORT TERM GOAL #2   Title Perform 6 minute walk test or 5 STS and get goal.    Baseline Baseline 6 min walk test = 1300' with moderate fatigue.    Time 2    Period Weeks    Status Achieved               PT [place] Term Goals - 08/12/20 1356       PT [place] TERM GOAL #4   Title Improve 6 minute walk test to >1400' without c/o fatigue, no more than min SOB.    Baseline Distance = 1300' with moderate fatigue and min SOB    Time 8    Period Weeks    Status New    Target Date 09/28/20                   Plan - 08/12/20 1348     Clinical Impression Statement  Patient 18 min late for visit.  Patient performed 6 min walk test, required slowing down secondary fatigue, SpO2 did decrease after walk, did not c/o SOB.  Patient demonstrated increasing pain with both repeated flexion and repeated extension. Patient will benefit from continued skilled PT to address deficits of endurance and LBP, may need to focus on static core stability initially.    Comorbidities COPD, CVA (06/23/20), DM, HTN    Examination-Activity Limitations Locomotion Level;Transfers;Sit;Squat;Sleep;Carry;Caring for Others;Dressing;Stairs;Stand;Lift    Examination-Participation Restrictions Cleaning;Meal Prep;Community Activity;Laundry;Pincus Badder Work;Volunteer    PT Treatment/Interventions ADLs/Self Care Home Management;Aquatic Therapy;Cryotherapy;Electrical Stimulation;Moist Heat;Gait training;Stair training;Functional mobility training;Therapeutic activities;Therapeutic exercise;Balance training;Neuromuscular re-education;Patient/family education;Joint Manipulations;Spinal Manipulations;Visual/perceptual remediation/compensation;Taping    PT Next Visit Plan HSS, balance assessment. Improve endurance, balance, core stabilization, LE strengthening.    PT Home Exercise Plan Access Code: [email or web address]    Consulted and Agree with Plan of Care Patient             Patient will benefit from skilled therapeutic intervention in order to improve the following deficits and impairments:  Abnormal gait, Decreased coordination, Decreased range of motion, Difficulty walking, Decreased endurance, Decreased activity tolerance, Pain, Hypomobility, Impaired flexibility, Decreased mobility, Decreased strength  Visit Diagnosis: Low back pain, unspecified back pain laterality, unspecified chronicity, unspecified whether sciatica present  Difficulty in walking, not elsewhere classified  Decreased functional mobility and endurance     Problem List Patient Active Problem List   Diagnosis Date Noted   Acute CVA  (cerebrovascular accident) (HCC) 06/23/2020   Daytime sleepiness 06/09/2020   Malnutrition of moderate degree 05/07/2020   Chronic pain 05/06/2020   Tobacco dependence 05/06/2020   Hypertension    Diabetes mellitus type 2 in nonobese (HCC)  COPD (chronic obstructive pulmonary disease) (HCC) 04/11/2020   Elevated troponin 04/11/2020   Acute on chronic respiratory failure with hypoxia (HCC) 04/11/2020   Elevated transaminase level    COPD with acute exacerbation (HCC) 06/30/2019   Acute respiratory failure with hypoxemia (HCC) 06/30/2019   Hypotension 06/30/2019   Near syncope 06/30/2019   Pleural nodule 06/30/2019   Colon cancer screening 12/18/2017   History of colonic polyps 12/18/2017   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   COPD GOLD  II 10/05/2016    Myrla Halsted, PT 08/12/2020, 1:59 PM  [facility] [address] [place], [place], [address] Phone: [phone]   Fax:  [phone]  Name: Glenn [name] MRN: [id number] Date of Birth: 1960/11/13

## 2020-08-14 ENCOUNTER — Ambulatory Visit: Payer: Medicaid Other | Admitting: Physical Therapy

## 2020-08-14 ENCOUNTER — Other Ambulatory Visit: Payer: Self-pay

## 2020-08-14 ENCOUNTER — Ambulatory Visit: Payer: Medicaid Other

## 2020-08-14 DIAGNOSIS — M545 Low back pain, unspecified: Secondary | ICD-10-CM

## 2020-08-14 DIAGNOSIS — Z7409 Other reduced mobility: Secondary | ICD-10-CM

## 2020-08-14 DIAGNOSIS — R262 Difficulty in walking, not elsewhere classified: Secondary | ICD-10-CM

## 2020-08-14 NOTE — Therapy (Signed)
[facility] Rehabilitation [facility] [address] [place], [place], [address] Phone: [phone]   Fax:  [phone]  Physical Therapy Treatment  Patient Details  Name: Glenn [name] MRN: [id number] Date of Birth: January 05, 1961 Referring Provider (PT): Fleet Contras, MD   Encounter Date: 08/14/2020   PT End of Session - 08/14/20 1427     Visit Number 3    Number of Visits 16    Date for PT Re-Evaluation 09/28/20    Authorization Type MCD [employer]    PT Start Time 1235    PT Stop Time 1315    PT Time Calculation (min) 40 min    Activity Tolerance Patient tolerated treatment well;Patient limited by fatigue    Behavior During Therapy [facility] for tasks assessed/performed             Past Medical History:  Diagnosis Date   Arthritis    Asthma    Chronic pain 05/06/2020   COPD (chronic obstructive pulmonary disease) (HCC)    CVA (cerebral vascular accident) (HCC)    2010; 06/2020   Diabetes mellitus type 2 in nonobese [facility])    ED (erectile dysfunction)    Hypertension    Tobacco dependence 05/06/2020    Past Surgical History:  Procedure Laterality Date   LUNG SURGERY     removed tissue    There were no vitals filed for this visit.   Subjective Assessment - 08/14/20 1239     Subjective Pt reports his HEP is going well and helping.    Currently in Pain? Yes    Pain Score 7     Pain Location Back    Pain Orientation Lower    Pain Descriptors / Indicators Sharp;Throbbing    Pain Type Chronic pain                [facility] PT Assessment - 08/14/20 0001       Posture/Postural Control   Posture Comments Mild L lateral shift                           [facility] Adult PT Treatment/Exercise - 08/14/20 0001       Self-Care   Self-Care Other Self-Care Comments;Posture    Other Self-Care Comments  see pt edu      Lumbar Exercises: Stretches   Prone on Elbows Stretch 3 reps;30 seconds    Press Ups 10 reps    Other Lumbar Stretch  Exercise Child's pose 10 seconds after press ups      Lumbar Exercises: Aerobic   Nustep L2 UE/LE 5'      Lumbar Exercises: Machines for Strengthening   Other Lumbar Machine Exercise Free Motion 7# walkouts and Palloff Press x5, x10   greater difficulty with L walkouts and palloff press, 2 apparent R sided core weakness     Lumbar Exercises: Sidelying   Other Sidelying Lumbar Exercises B thoracic rotation ending with R side facing up x10, x 15      Lumbar Exercises: Prone   Other Prone Lumbar Exercises prone lying 3 minutes                    PT Education - 08/14/20 1423     Education Details Education about mild shift with stiff walking presentation and apparent shift away from R lower back; R sided core weakness with walk outs and Palloff Press    Person(s) Educated Patient    Methods Explanation;Demonstration;Verbal cues  Comprehension Verbalized understanding;Verbal cues required              PT Short Term Goals - 08/12/20 1355       PT SHORT TERM GOAL #2   Title Perform 6 minute walk test or 5 STS and get goal.    Baseline Baseline 6 min walk test = 1300' with moderate fatigue.    Time 2    Period Weeks    Status Achieved               PT [place] Term Goals - 08/12/20 1356       PT [place] TERM GOAL #4   Title Improve 6 minute walk test to >1400' without c/o fatigue, no more than min SOB.    Baseline Distance = 1300' with moderate fatigue and min SOB    Time 8    Period Weeks    Status New    Target Date 09/28/20                   Plan - 08/14/20 1424     Clinical Impression Statement Pt presents with continued 7/10 low back pain, very stiff gait with minimal trunk sway and rotation. She demonstrates a mild left lateral shift away frow right-sided low back that did improved some after prone progression into press-ups and L sidelying thoracic rotation. Pt reports during session he feels more comfortable sleeping on his L side. With  standing core exercises, pt demonstrates notable weakness with R anti rotation, exhibiting greater difficulty maintaining free motion handle to midline with 7# walkouts and with Palloff Presses.    Comorbidities COPD, CVA (06/23/20), DM, HTN    Examination-Activity Limitations Locomotion Level;Transfers;Sit;Squat;Sleep;Carry;Caring for Others;Dressing;Stairs;Stand;Lift    Examination-Participation Restrictions Cleaning;Meal Prep;Community Activity;Laundry;Pincus Badder Work;Volunteer    PT Treatment/Interventions ADLs/Self Care Home Management;Aquatic Therapy;Cryotherapy;Electrical Stimulation;Moist Heat;Gait training;Stair training;Functional mobility training;Therapeutic activities;Therapeutic exercise;Balance training;Neuromuscular re-education;Patient/family education;Joint Manipulations;Spinal Manipulations;Visual/perceptual remediation/compensation;Taping    PT Next Visit Plan HSS, balance assessment. Improve endurance, balance, core stabilization, LE strengthening.    PT Home Exercise Plan Access Code: [email or web address]    Consulted and Agree with Plan of Care Patient             Patient will benefit from skilled therapeutic intervention in order to improve the following deficits and impairments:  Abnormal gait, Decreased coordination, Decreased range of motion, Difficulty walking, Decreased endurance, Decreased activity tolerance, Pain, Hypomobility, Impaired flexibility, Decreased mobility, Decreased strength  Visit Diagnosis: Low back pain, unspecified back pain laterality, unspecified chronicity, unspecified whether sciatica present  Difficulty in walking, not elsewhere classified  Decreased functional mobility and endurance     Problem List Patient Active Problem List   Diagnosis Date Noted   Acute CVA (cerebrovascular accident) (HCC) 06/23/2020   Daytime sleepiness 06/09/2020   Malnutrition of moderate degree 05/07/2020   Chronic pain 05/06/2020   Tobacco dependence 05/06/2020    Hypertension    Diabetes mellitus type 2 in nonobese [facility])    COPD (chronic obstructive pulmonary disease) (HCC) 04/11/2020   Elevated troponin 04/11/2020   Acute on chronic respiratory failure with hypoxia (HCC) 04/11/2020   Elevated transaminase level    COPD with acute exacerbation (HCC) 06/30/2019   Acute respiratory failure with hypoxemia (HCC) 06/30/2019   Hypotension 06/30/2019   Near syncope 06/30/2019   Pleural nodule 06/30/2019   Colon cancer screening 12/18/2017   History of colonic polyps 12/18/2017   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   COPD GOLD  II 10/05/2016    Marcelline Mates,  PT, DPT 08/14/2020, 2:28 PM  [facility] [address] [place], [place], [address] Phone: [phone]   Fax:  [phone]  Name: Antionne Enrique MRN: [id number] Date of Birth: 1961/01/03

## 2020-08-17 ENCOUNTER — Ambulatory Visit: Payer: Medicaid Other

## 2020-08-17 ENCOUNTER — Other Ambulatory Visit (HOSPITAL_COMMUNITY): Payer: Self-pay

## 2020-08-19 ENCOUNTER — Other Ambulatory Visit: Payer: Self-pay

## 2020-08-19 ENCOUNTER — Ambulatory Visit: Payer: Medicaid Other

## 2020-08-19 DIAGNOSIS — R262 Difficulty in walking, not elsewhere classified: Secondary | ICD-10-CM

## 2020-08-19 DIAGNOSIS — M545 Low back pain, unspecified: Secondary | ICD-10-CM

## 2020-08-19 DIAGNOSIS — Z7409 Other reduced mobility: Secondary | ICD-10-CM

## 2020-08-19 NOTE — Patient Instructions (Signed)
Access Code: [email or web address] URL: [email or web address] Date: 08/19/2020 Prepared by: Sharol Roussel  Exercises Seated Hamstring Stretch - 1 x daily - 7 x weekly - 1 sets - 3 reps - 20 hold Supine Lower Trunk Rotation - 2 x daily - 7 x weekly - 1 sets - 3 reps - 20 hold Supine Posterior Pelvic Tilt - 2 x daily - 7 x weekly - 1 sets - 10 reps - 3 hold Hooklying Isometric Hip Flexion - 1 x daily - 7 x weekly - 1 sets - 5 reps - 10 hold Hooklying Isometric Clamshell - 1 x daily - 7 x weekly - 1 sets - 20 reps Supine Bridge with Mini Swiss Ball Between Knees - 1 x daily - 7 x weekly - 1 sets - 15 reps - 5 hold

## 2020-08-19 NOTE — Therapy (Signed)
[facility] Outpatient Rehabilitation [facility] [address] [place], [place], [address] Phone: [phone]   Fax:  [phone]  Physical Therapy Treatment  Patient Details  Name: Glenn [name] MRN: [id number] Date of Birth: 12-15-1960 Referring Provider (PT): Fleet Contras, MD   Encounter Date: 08/19/2020   PT End of Session - 08/19/20 1004     Visit Number 4    Number of Visits 16    Date for PT Re-Evaluation 09/28/20    Authorization Type MCD [facility]    PT Start Time 4128   pt arrived late for therapy   PT Stop Time 1015    PT Time Calculation (min) 38 min    Activity Tolerance Patient tolerated treatment well    Behavior During Therapy [facility] for tasks assessed/performed             Past Medical History:  Diagnosis Date   Arthritis    Asthma    Chronic pain 05/06/2020   COPD (chronic obstructive pulmonary disease) (HCC)    CVA (cerebral vascular accident) (HCC)    2010; 06/2020   Diabetes mellitus type 2 in nonobese [facility])    ED (erectile dysfunction)    Hypertension    Tobacco dependence 05/06/2020    Past Surgical History:  Procedure Laterality Date   LUNG SURGERY     removed tissue    There were no vitals filed for this visit.   Subjective Assessment - 08/19/20 0951     Subjective The patient reports that his back was aggravated after last session in therapy.    Limitations Sitting;Lifting;House hold activities    Diagnostic tests MRI    Patient Stated Goals Better way to deal with his daily activities.  Be able to deal with the pain better.    Currently in Pain? Yes    Pain Score 6     Pain Location Back    Pain Orientation Lower                [facility] PT Assessment - 08/19/20 0001       Assessment   Medical Diagnosis deg disease of lumbar spine    Referring Provider (PT) Fleet Contras, MD      Ambulation/Gait   Ambulation/Gait Yes    Ambulation/Gait Assistance 6: Modified independent (Device/Increase time)    Assistive  device Straight cane    Gait Pattern Step-through pattern;Decreased trunk rotation    Gait Comments Lumbar brace                           [facility] Adult PT Treatment/Exercise - 08/19/20 0001       Lumbar Exercises: Stretches   Lower Trunk Rotation 20 seconds;3 reps      Lumbar Exercises: Aerobic   Nustep L3 UE/LE 5'      Lumbar Exercises: Standing   Shoulder Extension 15 reps;Strengthening    Theraband Level (Shoulder Extension) Level 3 (Green)      Lumbar Exercises: Supine   Pelvic Tilt 15 reps;5 seconds    Clam 20 reps    Clam Limitations unilat, Blue TB    Bridge with [employer] 15 reps    Isometric Hip Flexion 5 seconds;10 reps    Isometric Hip Flexion Limitations unilaterally                    PT Education - 08/19/20 1013     Education Details HEP update    Person(s) Educated Patient  Methods Explanation;Handout    Comprehension Verbalized understanding              PT Short Term Goals - 08/12/20 1355       PT SHORT TERM GOAL #2   Title Perform 6 minute walk test or 5 STS and get goal.    Baseline Baseline 6 min walk test = 1300' with moderate fatigue.    Time 2    Period Weeks    Status Achieved               PT [place] Term Goals - 08/12/20 1356       PT [place] TERM GOAL #4   Title Improve 6 minute walk test to >1400' without c/o fatigue, no more than min SOB.    Baseline Distance = 1300' with moderate fatigue and min SOB    Time 8    Period Weeks    Status New    Target Date 09/28/20                   Plan - 08/19/20 6606     Clinical Impression Statement The patient arrived to therapy reporting 6/10 low back pain.  He reports that he was sore after last session in therapy.  He arrived walking with lumbar brace and SEC.  The patient continues to have a reduction of trunk rotation with walking.  Modifications were made to HEP.  Progressed supine lumbar flexion based stabilization and stretches.  Added  the HSS.  The patient seems to tolerate therapy well. Recommend continued therapy for core stabilization and reduction of low back pain to increase walking, standing, and ADL tolerance.    Comorbidities COPD, CVA (06/23/20), DM, HTN    Examination-Activity Limitations Locomotion Level;Transfers;Sit;Squat;Sleep;Carry;Caring for Others;Dressing;Stairs;Stand;Lift    Examination-Participation Restrictions Cleaning;Meal Prep;Community Activity;Laundry;Pincus Badder Work;Volunteer    PT Treatment/Interventions ADLs/Self Care Home Management;Aquatic Therapy;Cryotherapy;Electrical Stimulation;Moist Heat;Gait training;Stair training;Functional mobility training;Therapeutic activities;Therapeutic exercise;Balance training;Neuromuscular re-education;Patient/family education;Joint Manipulations;Spinal Manipulations;Visual/perceptual remediation/compensation;Taping    PT Next Visit Plan HSS, balance assessment. Improve endurance, balance, core stabilization, LE strengthening.    PT Home Exercise Plan Access Code: [email or web address]    Consulted and Agree with Plan of Care Patient             Patient will benefit from skilled therapeutic intervention in order to improve the following deficits and impairments:  Abnormal gait, Decreased coordination, Decreased range of motion, Difficulty walking, Decreased endurance, Decreased activity tolerance, Pain, Hypomobility, Impaired flexibility, Decreased mobility, Decreased strength  Visit Diagnosis: Difficulty in walking, not elsewhere classified  Low back pain, unspecified back pain laterality, unspecified chronicity, unspecified whether sciatica present  Decreased functional mobility and endurance     Problem List Patient Active Problem List   Diagnosis Date Noted   Acute CVA (cerebrovascular accident) (HCC) 06/23/2020   Daytime sleepiness 06/09/2020   Malnutrition of moderate degree 05/07/2020   Chronic pain 05/06/2020   Tobacco dependence 05/06/2020   Hypertension     Diabetes mellitus type 2 in nonobese [facility])    COPD (chronic obstructive pulmonary disease) (HCC) 04/11/2020   Elevated troponin 04/11/2020   Acute on chronic respiratory failure with hypoxia (HCC) 04/11/2020   Elevated transaminase level    COPD with acute exacerbation (HCC) 06/30/2019   Acute respiratory failure with hypoxemia (HCC) 06/30/2019   Hypotension 06/30/2019   Near syncope 06/30/2019   Pleural nodule 06/30/2019   Colon cancer screening 12/18/2017   History of colonic polyps 12/18/2017   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   COPD GOLD  II 10/05/2016   Sharol Roussel, PT, DPT, OCS, Crt. DN Robet Leu 08/19/2020, 10:16 AM  [facility] [address] [place], [place], [address] Phone: [phone]   Fax:  [phone]  Name: Glenn [name] MRN: [id number] Date of Birth: May 22, 1960

## 2020-08-24 ENCOUNTER — Other Ambulatory Visit: Payer: Self-pay

## 2020-08-24 ENCOUNTER — Ambulatory Visit: Payer: Medicaid Other | Admitting: Physical Therapy

## 2020-08-24 ENCOUNTER — Encounter: Payer: Self-pay | Admitting: Physical Therapy

## 2020-08-24 DIAGNOSIS — R262 Difficulty in walking, not elsewhere classified: Secondary | ICD-10-CM

## 2020-08-24 DIAGNOSIS — Z7409 Other reduced mobility: Secondary | ICD-10-CM

## 2020-08-24 DIAGNOSIS — M545 Low back pain, unspecified: Secondary | ICD-10-CM | POA: Diagnosis not present

## 2020-08-24 NOTE — Therapy (Signed)
[facility] Outpatient Rehabilitation [facility] [address] [place], [place], [address] Phone: [phone]   Fax:  [phone]  Physical Therapy Treatment  Patient Details  Name: Glenn [name] MRN: [id number] Date of Birth: 1960-12-05 Referring Provider (PT): Fleet Contras, MD   Encounter Date: 08/24/2020   PT End of Session - 08/24/20 1051     Visit Number 5    Number of Visits 16    Date for PT Re-Evaluation 09/28/20    Authorization Type [facility]    PT Start Time 1051    PT Stop Time 1129    PT Time Calculation (min) 38 min    Activity Tolerance Patient tolerated treatment well    Behavior During Therapy [facility] for tasks assessed/performed             Past Medical History:  Diagnosis Date   Arthritis    Asthma    Chronic pain 05/06/2020   COPD (chronic obstructive pulmonary disease) (HCC)    CVA (cerebral vascular accident) (HCC)    2010; 06/2020   Diabetes mellitus type 2 in nonobese [facility])    ED (erectile dysfunction)    Hypertension    Tobacco dependence 05/06/2020    Past Surgical History:  Procedure Laterality Date   LUNG SURGERY     removed tissue    There were no vitals filed for this visit.   Subjective Assessment - 08/24/20 1055     Subjective Patient reports he has increased pain today, has done some of the HEP but leaves the ones alone that cause pain. Patient also reports increased frequency of urinating in the middle of the night. Patient reports neck pain too. Patient reports he's been on pain medication for 20 years, before the accedents.    Limitations Sitting;Lifting;House hold activities    Patient Stated Goals Better way to deal with his daily activities.  Be able to deal with the pain better.    Currently in Pain? Yes    Pain Score 8     Pain Location Back    Pain Orientation Lower    Pain Descriptors / Indicators Sharp;Throbbing    Pain Type Chronic pain                [facility] PT Assessment - 08/24/20 0001        Assessment   Medical Diagnosis deg disease of lumbar spine    Referring Provider (PT) Fleet Contras, MD      Precautions   Precautions None      Restrictions   Weight Bearing Restrictions No                           [facility] Adult PT Treatment/Exercise - 08/24/20 0001       Ambulation/Gait   Gait Comments No brace today      Lumbar Exercises: Stretches   Passive Hamstring Stretch 30 seconds    Passive Hamstring Stretch Limitations Manual followed by supine with strap R/L      Lumbar Exercises: Standing   Other Standing Lumbar Exercises Pallof Press GREEN R/L 2x10ea      Lumbar Exercises: Supine   Dead Bug 5 seconds;10 reps    Dead Bug Limitations Static    Bridge 10 reps    Bridge Limitations Increased pain      Lumbar Exercises: Sidelying   Clam 10 reps;Right;Left    Clam Limitations GREEN 2sets    Hip Abduction 10 reps;Right;Left  Manual Therapy   Manual Therapy Soft tissue mobilization    Soft tissue mobilization STM, B levator, lower thor/lumbar paraspinals                    PT Education - 08/24/20 1105     Education Details Taught alternative supine hamstring stretch with strap in place of seated hamstring stretch.    Person(s) Educated Patient    Methods Explanation    Comprehension Verbalized understanding;Returned demonstration              PT Short Term Goals - 08/12/20 1355       PT SHORT TERM GOAL #2   Title Perform 6 minute walk test or 5 STS and get goal.    Baseline Baseline 6 min walk test = 1300' with moderate fatigue.    Time 2    Period Weeks    Status Achieved               PT [place] Term Goals - 08/12/20 1356       PT [place] TERM GOAL #4   Title Improve 6 minute walk test to >1400' without c/o fatigue, no more than min SOB.    Baseline Distance = 1300' with moderate fatigue and min SOB    Time 8    Period Weeks    Status New    Target Date 09/28/20                   Plan  - 08/24/20 1115     Clinical Impression Statement The patient arrived to therapy reporting 8/10 low back pain, an increase from prior, no brace today. The patient continues to have reduced trunk rotation while walking and reports trunk rotation exercise increases pain. Increased challenging core stability exercises during treatment.  The patient seems to tolerate therapy well with a flexion biased program. Patient will benefit from continued therapy for core stabilization and reduction of low back pain to increase walking, standing, and ADL tolerance.    Comorbidities COPD, CVA (06/23/20), DM, HTN    Examination-Activity Limitations Locomotion Level;Transfers;Sit;Squat;Sleep;Carry;Caring for Others;Dressing;Stairs;Stand;Lift    Examination-Participation Restrictions Cleaning;Meal Prep;Community Activity;Laundry;Pincus Badder Work;Volunteer    PT Treatment/Interventions ADLs/Self Care Home Management;Aquatic Therapy;Cryotherapy;Electrical Stimulation;Moist Heat;Gait training;Stair training;Functional mobility training;Therapeutic activities;Therapeutic exercise;Balance training;Neuromuscular re-education;Patient/family education;Joint Manipulations;Spinal Manipulations;Visual/perceptual remediation/compensation;Taping    PT Next Visit Plan Improve endurance, balance, core stabilization, LE strengthening.    PT Home Exercise Plan Access Code: [email or web address]    Consulted and Agree with Plan of Care Patient             Patient will benefit from skilled therapeutic intervention in order to improve the following deficits and impairments:  Abnormal gait, Decreased coordination, Decreased range of motion, Difficulty walking, Decreased endurance, Decreased activity tolerance, Pain, Hypomobility, Impaired flexibility, Decreased mobility, Decreased strength  Visit Diagnosis: Low back pain, unspecified back pain laterality, unspecified chronicity, unspecified whether sciatica present  Difficulty in walking, not elsewhere  classified  Decreased functional mobility and endurance     Problem List Patient Active Problem List   Diagnosis Date Noted   Acute CVA (cerebrovascular accident) (HCC) 06/23/2020   Daytime sleepiness 06/09/2020   Malnutrition of moderate degree 05/07/2020   Chronic pain 05/06/2020   Tobacco dependence 05/06/2020   Hypertension    Diabetes mellitus type 2 in nonobese [facility])    COPD (chronic obstructive pulmonary disease) (HCC) 04/11/2020   Elevated troponin 04/11/2020   Acute on chronic respiratory failure with hypoxia (HCC) 04/11/2020  Elevated transaminase level    COPD with acute exacerbation (HCC) 06/30/2019   Acute respiratory failure with hypoxemia (HCC) 06/30/2019   Hypotension 06/30/2019   Near syncope 06/30/2019   Pleural nodule 06/30/2019   Colon cancer screening 12/18/2017   History of colonic polyps 12/18/2017   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   COPD GOLD  II 10/05/2016    Myrla Halsted, PT 08/24/2020, 11:44 AM  [facility] [address] [place], [place], [address] Phone: [phone]   Fax:  [phone]  Name: Glenn [name] MRN: [id number] Date of Birth: 01-11-1961

## 2020-08-26 ENCOUNTER — Ambulatory Visit: Payer: Medicaid Other

## 2020-08-26 ENCOUNTER — Other Ambulatory Visit: Payer: Self-pay

## 2020-08-26 DIAGNOSIS — Z7409 Other reduced mobility: Secondary | ICD-10-CM

## 2020-08-26 DIAGNOSIS — M545 Low back pain, unspecified: Secondary | ICD-10-CM | POA: Diagnosis not present

## 2020-08-26 DIAGNOSIS — R262 Difficulty in walking, not elsewhere classified: Secondary | ICD-10-CM

## 2020-08-26 NOTE — Therapy (Signed)
[facility] Outpatient Rehabilitation [facility] [address] [place], [place], [address] Phone: [phone]   Fax:  [phone]  Physical Therapy Treatment  Patient Details  Name: Glenn [name] MRN: [id number] Date of Birth: [birth year] Referring Provider (PT): Fleet Contras, MD   Encounter Date: 08/26/2020   PT End of Session - 08/26/20 1052     Visit Number 6    Number of Visits 16    Date for PT Re-Evaluation 09/28/20    Authorization Type MCD [facility]    PT Start Time 1019   patient arrived late   PT Stop Time 1100    PT Time Calculation (min) 41 min    Activity Tolerance Patient tolerated treatment well    Behavior During Therapy [facility] for tasks assessed/performed             Past Medical History:  Diagnosis Date   Arthritis    Asthma    Chronic pain 05/06/2020   COPD (chronic obstructive pulmonary disease) (HCC)    CVA (cerebral vascular accident) (HCC)    2010; 06/2020   Diabetes mellitus type 2 in nonobese [facility])    ED (erectile dysfunction)    Hypertension    Tobacco dependence 05/06/2020    Past Surgical History:  Procedure Laterality Date   LUNG SURGERY     removed tissue    There were no vitals filed for this visit.   Subjective Assessment - 08/26/20 1029     Subjective The patient reports that his back is bothering him today across on both sides.    Limitations Sitting;Lifting;House hold activities    How [place] can you sit comfortably? 25 minutes    How [place] can you stand comfortably? 10-15 minutes    How [place] can you walk comfortably? 5 minutes or less    Diagnostic tests MRI    Patient Stated Goals Better way to deal with his daily activities.  Be able to deal with the pain better.    Currently in Pain? Yes    Pain Score 8     Pain Location Back    Pain Orientation Lower                [facility] PT Assessment - 08/26/20 0001       Assessment   Medical Diagnosis deg disease of lumbar spine    Referring Provider (PT)  Fleet Contras, MD                           [facility] Adult PT Treatment/Exercise - 08/26/20 0001       Ambulation/Gait   Ambulation/Gait Yes    Ambulation/Gait Assistance 7: Independent    Assistive device None    Gait Pattern Decreased trunk rotation      Lumbar Exercises: Stretches   Passive Hamstring Stretch 30 seconds;3 reps;Left;Right    Passive Hamstring Stretch Limitations seated    Single Knee to Chest Stretch 20 seconds;3 reps;Left;Right      Lumbar Exercises: Aerobic   Nustep L4 x 5 minutes      Lumbar Exercises: Standing   Other Standing Lumbar Exercises Pallof Press GREEN R/L 2x10ea      Lumbar Exercises: Seated   Loll Arc Quad on [employer]    LAQ on [employer] (lbs) Green SB    Hip Flexion on [place] 20 reps;Both    Hip Flexion on Ball Limitations Green SB      Lumbar Exercises: Supine  Pelvic Tilt 15 reps    Bent Knee Raise 20 reps    Bent Knee Raise Limitations alt                      PT Short Term Goals - 08/12/20 1355       PT SHORT TERM GOAL #2   Title Perform 6 minute walk test or 5 STS and get goal.    Baseline Baseline 6 min walk test = 1300' with moderate fatigue.    Time 2    Period Weeks    Status Achieved               PT [place] Term Goals - 08/12/20 1356       PT [place] TERM GOAL #4   Title Improve 6 minute walk test to >1400' without c/o fatigue, no more than min SOB.    Baseline Distance = 1300' with moderate fatigue and min SOB    Time 8    Period Weeks    Status New    Target Date 09/28/20                   Plan - 08/26/20 1032     Clinical Impression Statement The patient arrived to therapy reporting 8/10 low back pain again across the low back.  He arrived walking without a cane.  The patient reported symptoms aggravated with a sitting pelvic tilts.  Progressed with sitting on SB marches and knee extension.  Added the seated leg press.  He tolerated that well.  Performed genlte  stretches and core stabilization.  The patient reported that his pain was a 8/10 at the end of the session.  He stated that nothing in therapy made the pain better today.  He is taking a less stronger dose of pain medication.  Recommend continued therapy for continued reduction of pain medication.    Personal Factors and Comorbidities Comorbidity 3+    Comorbidities COPD, CVA (06/23/20), DM, HTN    Examination-Activity Limitations Locomotion Level;Transfers;Sit;Squat;Sleep;Carry;Caring for Others;Dressing;Stairs;Stand;Lift    Examination-Participation Restrictions Cleaning;Meal Prep;Community Activity;Laundry;Pincus Badder Work;Volunteer    PT Treatment/Interventions ADLs/Self Care Home Management;Aquatic Therapy;Cryotherapy;Electrical Stimulation;Moist Heat;Gait training;Stair training;Functional mobility training;Therapeutic activities;Therapeutic exercise;Balance training;Neuromuscular re-education;Patient/family education;Joint Manipulations;Spinal Manipulations;Visual/perceptual remediation/compensation;Taping    PT Next Visit Plan , re-assess the 6 minute walk test, Improve endurance, balance, core stabilization, LE strengthening.    PT Home Exercise Plan Access Code: [email or web address]    Consulted and Agree with Plan of Care Patient             Patient will benefit from skilled therapeutic intervention in order to improve the following deficits and impairments:  Abnormal gait, Decreased coordination, Decreased range of motion, Difficulty walking, Decreased endurance, Decreased activity tolerance, Pain, Hypomobility, Impaired flexibility, Decreased mobility, Decreased strength  Visit Diagnosis: Low back pain, unspecified back pain laterality, unspecified chronicity, unspecified whether sciatica present  Difficulty in walking, not elsewhere classified  Decreased functional mobility and endurance     Problem List Patient Active Problem List   Diagnosis Date Noted   Acute CVA (cerebrovascular  accident) (HCC) 06/23/2020   Daytime sleepiness 06/09/2020   Malnutrition of moderate degree 05/07/2020   Chronic pain 05/06/2020   Tobacco dependence 05/06/2020   Hypertension    Diabetes mellitus type 2 in nonobese [facility])    COPD (chronic obstructive pulmonary disease) (HCC) 04/11/2020   Elevated troponin 04/11/2020   Acute on chronic respiratory failure with hypoxia (HCC) 04/11/2020   Elevated transaminase level  COPD with acute exacerbation (HCC) 06/30/2019   Acute respiratory failure with hypoxemia (HCC) 06/30/2019   Hypotension 06/30/2019   Near syncope 06/30/2019   Pleural nodule 06/30/2019   Colon cancer screening 12/18/2017   History of colonic polyps 12/18/2017   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   COPD GOLD  II 10/05/2016   Sharol Roussel, PT, DPT, OCS, Crt. DN  Robet Leu 08/26/2020, 11:01 AM  [facility] [address] [place], [place], [address] Phone: [phone]   Fax:  [phone]  Name: Mayur Duman MRN: [id number] Date of Birth: 1960-06-01

## 2020-08-31 ENCOUNTER — Ambulatory Visit: Payer: Medicaid Other

## 2020-08-31 ENCOUNTER — Other Ambulatory Visit: Payer: Self-pay

## 2020-08-31 DIAGNOSIS — Z7409 Other reduced mobility: Secondary | ICD-10-CM

## 2020-08-31 DIAGNOSIS — M545 Low back pain, unspecified: Secondary | ICD-10-CM

## 2020-08-31 DIAGNOSIS — R262 Difficulty in walking, not elsewhere classified: Secondary | ICD-10-CM

## 2020-08-31 NOTE — Therapy (Signed)
[facility] Outpatient Rehabilitation [facility] [address] [place], [place], [address] Phone: [phone]   Fax:  [phone]  Physical Therapy Treatment  Patient Details  Name: Glenn [name] MRN: [id number] Date of Birth: 21-Apr-1960 Referring Provider (PT): Fleet Contras, MD   Encounter Date: 08/31/2020   PT End of Session - 08/31/20 1041     Visit Number 7    Number of Visits 16    Date for PT Re-Evaluation 09/28/20    Authorization Type MCD [employer]    PT Start Time 1020   pt arrived late for therapy   PT Stop Time 1100    PT Time Calculation (min) 40 min    Activity Tolerance Patient tolerated treatment well    Behavior During Therapy [facility] for tasks assessed/performed             Past Medical History:  Diagnosis Date   Arthritis    Asthma    Chronic pain 05/06/2020   COPD (chronic obstructive pulmonary disease) (HCC)    CVA (cerebral vascular accident) (HCC)    2010; 06/2020   Diabetes mellitus type 2 in nonobese [facility])    ED (erectile dysfunction)    Hypertension    Tobacco dependence 05/06/2020    Past Surgical History:  Procedure Laterality Date   LUNG SURGERY     removed tissue    There were no vitals filed for this visit.   Subjective Assessment - 08/31/20 1040     Subjective The patient reports that his back pain is a 7/10 today.    Limitations Sitting;Lifting;House hold activities    How [place] can you walk comfortably? 6 minutes    Currently in Pain? Yes    Pain Score 7     Pain Location Back    Pain Orientation Lower                [facility] PT Assessment - 08/31/20 0001       Assessment   Medical Diagnosis deg disease of lumbar spine    Referring Provider (PT) Fleet Contras, MD      AROM   Lumbar Flexion 75%    Lumbar Extension 50%    Lumbar - Right Side Bend Mid thigh    Lumbar - Left Side Bend mid thigh    Lumbar - Right Rotation 25%    Lumbar - Left Rotation 50%      6 Minute Walk- Baseline   6 Minute Walk-  Baseline yes    HR (bpm) 87    02 Sat (%RA) 96 %    Modified Borg Scale for Dyspnea 0- Nothing at all    Perceived Rate of Exertion (Borg) 6-      6 Minute walk- Post Test   6 Minute Walk Post Test yes    HR (bpm) 110    02 Sat (%RA) 95 %    Modified Borg Scale for Dyspnea 2- Mild shortness of breath    Perceived Rate of Exertion (Borg) 13- Somewhat hard      6 minute walk test results    Aerobic Endurance Distance Walked 1240                           [facility] Adult PT Treatment/Exercise - 08/31/20 0001       Ambulation/Gait   Ambulation/Gait Yes    Ambulation/Gait Assistance 7: Independent    Assistive device None    Gait Pattern Decreased trunk rotation  Lumbar Exercises: Stretches   Passive Hamstring Stretch 30 seconds;3 reps;Left;Right    Passive Hamstring Stretch Limitations seated      Lumbar Exercises: Aerobic   Nustep L6 x 5 minutes      Lumbar Exercises: Standing   Shoulder Extension 15 reps;Strengthening    Theraband Level (Shoulder Extension) Level 3 (Green)    Other Standing Lumbar Exercises Pallof Press GREEN R/L 2x10ea      Lumbar Exercises: Seated   [place] Arc Quad on [employer] on [employer] (lbs) Green SB    Hip Flexion on [place] 20 reps;Both    Hip Flexion on Ball Limitations Green SB                      PT Short Term Goals - 08/12/20 1355       PT SHORT TERM GOAL #2   Title Perform 6 minute walk test or 5 STS and get goal.    Baseline Baseline 6 min walk test = 1300' with moderate fatigue.    Time 2    Period Weeks    Status Achieved               PT Dyk Term Goals - 08/31/20 1059       PT [place] TERM GOAL #1   Title The patient will be able to sit  for 35-45 minutes with low back pain under 4/10.    Baseline 25 minutes    Time 8    Status On-going      PT Nee TERM GOAL #2   Title The patient will be able to stand for 20 minutes with low back pain under 4/10    Baseline 10-15  minutes    Time 8    Period Weeks    Status On-going      PT [place] TERM GOAL #3   Title The patient will be able to walk for 10-20 minutes with low back pain under 5/10.    Baseline 5 minutes or less    Time 8    Period Weeks    Status On-going                   Plan - 08/31/20 1041     Clinical Impression Statement The patient continues to have SOB with increase of activities.  We performed the 6 minute walk test and he went to 1240 feet.  His oxygen was at 95% post the test.  The patient reports 7/10 back pain when arriving to therapy.  And an 8/10 at the end of the session.  Continued with standing lumbar stabilization and sitting on stability exercises.  The patient reports that the only thing that makes his back pain better is the medication. The presents with improvement of lumbar rotation motion bilaterally.  He has the most limit with left lumbar rotation and reports pain increase with that.  Recommend conitnued therapy.    Personal Factors and Comorbidities Comorbidity 3+    Comorbidities COPD, CVA (06/23/20), DM, HTN    Examination-Activity Limitations Locomotion Level;Transfers;Sit;Squat;Sleep;Carry;Caring for Others;Dressing;Stairs;Stand;Lift    Examination-Participation Restrictions Cleaning;Meal Prep;Community Activity;Laundry;Pincus Badder Work;Volunteer    PT Treatment/Interventions ADLs/Self Care Home Management;Aquatic Therapy;Cryotherapy;Electrical Stimulation;Moist Heat;Gait training;Stair training;Functional mobility training;Therapeutic activities;Therapeutic exercise;Balance training;Neuromuscular re-education;Patient/family education;Joint Manipulations;Spinal Manipulations;Visual/perceptual remediation/compensation;Taping    PT Next Visit Plan , Improve endurance, balance, core stabilization, LE strengthening.    PT Home Exercise Plan Access Code: [email or web address]    Consulted and Agree  with Plan of Care Patient             Patient will benefit from skilled therapeutic  intervention in order to improve the following deficits and impairments:  Abnormal gait, Decreased coordination, Decreased range of motion, Difficulty walking, Decreased endurance, Decreased activity tolerance, Pain, Hypomobility, Impaired flexibility, Decreased mobility, Decreased strength  Visit Diagnosis: Low back pain, unspecified back pain laterality, unspecified chronicity, unspecified whether sciatica present  Difficulty in walking, not elsewhere classified  Decreased functional mobility and endurance     Problem List Patient Active Problem List   Diagnosis Date Noted   Acute CVA (cerebrovascular accident) (HCC) 06/23/2020   Daytime sleepiness 06/09/2020   Malnutrition of moderate degree 05/07/2020   Chronic pain 05/06/2020   Tobacco dependence 05/06/2020   Hypertension    Diabetes mellitus type 2 in nonobese [facility])    COPD (chronic obstructive pulmonary disease) (HCC) 04/11/2020   Elevated troponin 04/11/2020   Acute on chronic respiratory failure with hypoxia (HCC) 04/11/2020   Elevated transaminase level    COPD with acute exacerbation (HCC) 06/30/2019   Acute respiratory failure with hypoxemia (HCC) 06/30/2019   Hypotension 06/30/2019   Near syncope 06/30/2019   Pleural nodule 06/30/2019   Colon cancer screening 12/18/2017   History of colonic polyps 12/18/2017   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   COPD GOLD  II 10/05/2016   Sharol Roussel, PT, DPT, OCS, Crt. DN  Robet Leu 08/31/2020, 11:01 AM  [facility] [address] [place], [place], [address] Phone: [phone]   Fax:  [phone]  Name: Early Steel MRN: [id number] Date of Birth: 10/07/1960

## 2020-09-02 ENCOUNTER — Other Ambulatory Visit: Payer: Self-pay

## 2020-09-02 ENCOUNTER — Ambulatory Visit: Payer: Medicaid Other

## 2020-09-02 DIAGNOSIS — Z7409 Other reduced mobility: Secondary | ICD-10-CM

## 2020-09-02 DIAGNOSIS — M545 Low back pain, unspecified: Secondary | ICD-10-CM

## 2020-09-02 DIAGNOSIS — R262 Difficulty in walking, not elsewhere classified: Secondary | ICD-10-CM

## 2020-09-02 NOTE — Therapy (Addendum)
[place], [place], [address] Phone: [phone]   Fax:  [phone]  Physical Therapy Treatment / Discharge  Patient Details  Name: Glenn [name] MRN: [id number] Date of Birth: Mar 07, 1960 Referring Provider (PT): Nolene Ebbs, MD  PHYSICAL THERAPY DISCHARGE SUMMARY  Visits from Start of Care: 08/03/20  Current functional level related to goals / functional outcomes: Unable to assess due to non-return   Remaining deficits: Unable to assess due to non-return   Education / Equipment: HEP   Patient agrees to discharge. Patient goals were partially met. Patient is being discharged due to not returning since the last visit.   Encounter Date: 09/02/2020   PT End of Session - 09/02/20 1030     Visit Number 8    Number of Visits 16    Date for PT Re-Evaluation 09/28/20    Authorization Type [facility]    PT Start Time 1028   pt arrived late for therapy (13 minutes)   PT Stop Time 1100    PT Time Calculation (min) 32 min    Activity Tolerance Patient tolerated treatment well    Behavior During Therapy [facility] for tasks assessed/performed             Past Medical History:  Diagnosis Date   Arthritis    Asthma    Chronic pain 05/06/2020   COPD (chronic obstructive pulmonary disease) ([place])    CVA (cerebral vascular accident) ([place])    2010; 06/2020   Diabetes mellitus type 2 in nonobese [facility])    ED (erectile dysfunction)    Hypertension    Tobacco dependence 05/06/2020    Past Surgical History:  Procedure Laterality Date   LUNG SURGERY     removed tissue    There were no vitals filed for this visit.   Subjective Assessment - 09/02/20 1032     Subjective The patient reports that he is in pain today.  He has not changed his activities.  His pain is a 8/10    Currently in Pain? Yes    Pain Score 8     Pain Location Back    Pain Orientation Lower                [facility] PT Assessment -  09/02/20 0001       Assessment   Medical Diagnosis deg disease of lumbar spine    Referring Provider (PT) Nolene Ebbs, MD                           [facility] Adult PT Treatment/Exercise - 09/02/20 0001       Lumbar Exercises: Aerobic   Nustep L8 x 6 minutes      Lumbar Exercises: Machines for Strengthening   Leg Press 65# bilat 10 x 3      Lumbar Exercises: Standing   Shoulder Extension 15 reps;Strengthening    Theraband Level (Shoulder Extension) Level 3 (Green)    Other Standing Lumbar Exercises hip abduction at wall isometric 5" x 10    Other Standing Lumbar Exercises Pallof Press GREEN R/L 2x10ea      Lumbar Exercises: Seated   Alia Arc Quad on [place] Both;15 reps    LAQ on [employer] (lbs) Green SB    LAQ on [place] Limitations per leg    Hip Flexion on [place] Both;15 reps    Hip Flexion on Ball Limitations Green SB, 30 total    Other  Seated Lumbar Exercises Sitting SB press outs Black TB x10 each                      PT Short Term Goals - 08/12/20 1355       PT SHORT TERM GOAL #2   Title Perform 6 minute walk test or 5 STS and get goal.    Baseline Baseline 6 min walk test = 1300' with moderate fatigue.    Time 2    Period Weeks    Status Achieved               PT [place] Term Goals - 08/31/20 1059       PT [place] TERM GOAL #1   Title The patient will be able to sit  for 35-45 minutes with low back pain under 4/10.    Baseline 25 minutes    Time 8    Status On-going      PT Lieder TERM GOAL #2   Title The patient will be able to stand for 20 minutes with low back pain under 4/10    Baseline 10-15 minutes    Time 8    Period Weeks    Status On-going      PT [place] TERM GOAL #3   Title The patient will be able to walk for 10-20 minutes with low back pain under 5/10.    Baseline 5 minutes or less    Time 8    Period Weeks    Status On-going                   Plan - 09/02/20 1031     Clinical Impression Statement  The patient continues to arrived to therapy reporting high pain levels.  He is able to proceed through therapy without limits or signs of increase of pain levels.  He reports fatigue in therapy.  Recommend continued therapy 1-2 more sessions.    Personal Factors and Comorbidities Comorbidity 3+    Comorbidities COPD, CVA (06/23/20), DM, HTN    Examination-Activity Limitations Locomotion Level;Transfers;Sit;Squat;Sleep;Carry;Caring for Others;Dressing;Stairs;Stand;Lift    Examination-Participation Restrictions Cleaning;Meal Prep;Community Activity;Laundry;[place] [employer] Work;Volunteer    PT Treatment/Interventions ADLs/Self Care Home Management;Aquatic Therapy;Cryotherapy;Electrical Stimulation;Moist Heat;Gait training;Stair training;Functional mobility training;Therapeutic activities;Therapeutic exercise;Balance training;Neuromuscular re-education;Patient/family education;Joint Manipulations;Spinal Manipulations;Visual/perceptual remediation/compensation;Taping    PT Next Visit Plan , Improve endurance, balance, core stabilization, LE strengthening.    PT Home Exercise Plan Access Code: [email or web address]    Consulted and Agree with Plan of Care Patient             Patient will benefit from skilled therapeutic intervention in order to improve the following deficits and impairments:  Abnormal gait, Decreased coordination, Decreased range of motion, Difficulty walking, Decreased endurance, Decreased activity tolerance, Pain, Hypomobility, Impaired flexibility, Decreased mobility, Decreased strength  Visit Diagnosis: Low back pain, unspecified back pain laterality, unspecified chronicity, unspecified whether sciatica present  Difficulty in walking, not elsewhere classified  Decreased functional mobility and endurance     Problem List Patient Active Problem List   Diagnosis Date Noted   Acute CVA (cerebrovascular accident) ([place]) 06/23/2020   Daytime sleepiness 06/09/2020   Malnutrition of moderate degree  05/07/2020   Chronic pain 05/06/2020   Tobacco dependence 05/06/2020   Hypertension    Diabetes mellitus type 2 in nonobese [facility])    COPD (chronic obstructive pulmonary disease) ([place]) 04/11/2020   Elevated troponin 04/11/2020   Acute on chronic respiratory failure with hypoxia (HCC) 04/11/2020   Elevated transaminase  level    COPD with acute exacerbation ([place]) 06/30/2019   Acute respiratory failure with hypoxemia (HCC) 06/30/2019   Hypotension 06/30/2019   Near syncope 06/30/2019   Pleural nodule 06/30/2019   Colon cancer screening 12/18/2017   History of colonic polyps 12/18/2017   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   COPD GOLD  II 10/05/2016   Rich Number, PT, DPT, OCS, Crt. DN Bethena Midget 09/02/2020, 12:55 PM  09/24/20:  The patient has not returned to therapy since his last visit on 09/02/20. He will be discharged due to non-return.   [place], [place], [address] Phone: [phone]   Fax:  [phone]  Name: Arnell Slivinski MRN: [id number] Date of Birth: [birth year]

## 2020-09-14 ENCOUNTER — Other Ambulatory Visit (HOSPITAL_COMMUNITY): Payer: Self-pay

## 2020-10-12 ENCOUNTER — Emergency Department (HOSPITAL_BASED_OUTPATIENT_CLINIC_OR_DEPARTMENT_OTHER): Payer: Medicaid Other | Admitting: Radiology

## 2020-10-12 ENCOUNTER — Encounter (HOSPITAL_BASED_OUTPATIENT_CLINIC_OR_DEPARTMENT_OTHER): Payer: Self-pay | Admitting: *Deleted

## 2020-10-12 ENCOUNTER — Other Ambulatory Visit: Payer: Self-pay

## 2020-10-12 ENCOUNTER — Emergency Department (HOSPITAL_BASED_OUTPATIENT_CLINIC_OR_DEPARTMENT_OTHER)
Admission: EM | Admit: 2020-10-12 | Discharge: 2020-10-12 | Disposition: A | Discharge status: Home / Self Care | Payer: Medicaid Other | Attending: Emergency Medicine | Admitting: Emergency Medicine

## 2020-10-12 DIAGNOSIS — Z79899 Other long term (current) drug therapy: Secondary | ICD-10-CM | POA: Insufficient documentation

## 2020-10-12 DIAGNOSIS — Z87891 Personal history of nicotine dependence: Secondary | ICD-10-CM | POA: Diagnosis not present

## 2020-10-12 DIAGNOSIS — E119 Type 2 diabetes mellitus without complications: Secondary | ICD-10-CM | POA: Insufficient documentation

## 2020-10-12 DIAGNOSIS — Z86010 Personal history of colonic polyps: Secondary | ICD-10-CM | POA: Diagnosis not present

## 2020-10-12 DIAGNOSIS — Z7984 Long term (current) use of oral hypoglycemic drugs: Secondary | ICD-10-CM | POA: Diagnosis not present

## 2020-10-12 DIAGNOSIS — I10 Essential (primary) hypertension: Secondary | ICD-10-CM | POA: Diagnosis not present

## 2020-10-12 DIAGNOSIS — R0602 Shortness of breath: Secondary | ICD-10-CM | POA: Diagnosis present

## 2020-10-12 DIAGNOSIS — Z7902 Long term (current) use of antithrombotics/antiplatelets: Secondary | ICD-10-CM | POA: Insufficient documentation

## 2020-10-12 DIAGNOSIS — J45909 Unspecified asthma, uncomplicated: Secondary | ICD-10-CM | POA: Diagnosis not present

## 2020-10-12 DIAGNOSIS — J449 Chronic obstructive pulmonary disease, unspecified: Secondary | ICD-10-CM | POA: Diagnosis not present

## 2020-10-12 DIAGNOSIS — R059 Cough, unspecified: Secondary | ICD-10-CM

## 2020-10-12 LAB — CBC WITH DIFFERENTIAL/PLATELET
Abs Immature Granulocytes: 0.01 10*3/uL (ref 0.00–0.07)
Basophils Absolute: 0 10*3/uL (ref 0.0–0.1)
Basophils Relative: 0 %
Eosinophils Absolute: 0 10*3/uL (ref 0.0–0.5)
Eosinophils Relative: 0 %
HCT: 39.9 % (ref 39.0–52.0)
Hemoglobin: 13.1 g/dL (ref 13.0–17.0)
Immature Granulocytes: 0 %
Lymphocytes Relative: 34 %
Lymphs Abs: 2 10*3/uL (ref 0.7–4.0)
MCH: 28.9 pg (ref 26.0–34.0)
MCHC: 32.8 g/dL (ref 30.0–36.0)
MCV: 88.1 fL (ref 80.0–100.0)
Monocytes Absolute: 0.5 10*3/uL (ref 0.1–1.0)
Monocytes Relative: 8 %
Neutro Abs: 3.4 10*3/uL (ref 1.7–7.7)
Neutrophils Relative %: 58 %
Platelets: 259 10*3/uL (ref 150–400)
RBC: 4.53 MIL/uL (ref 4.22–5.81)
RDW: 13.4 % (ref 11.5–15.5)
WBC: 5.9 10*3/uL (ref 4.0–10.5)
nRBC: 0 % (ref 0.0–0.2)

## 2020-10-12 LAB — BASIC METABOLIC PANEL
Anion gap: 9 (ref 5–15)
BUN: 9 mg/dL (ref 6–20)
CO2: 24 mmol/L (ref 22–32)
Calcium: 8.2 mg/dL — ABNORMAL LOW (ref 8.9–10.3)
Chloride: 102 mmol/L (ref 98–111)
Creatinine, Ser: 0.76 mg/dL (ref 0.61–1.24)
GFR, Estimated: >60 mL/min (ref >60)
Glucose, Bld: 250 mg/dL — ABNORMAL HIGH (ref 70–99)
Potassium: 3.8 mmol/L (ref 3.5–5.1)
Sodium: 135 mmol/L (ref 135–145)

## 2020-10-12 LAB — TROPONIN I (HIGH SENSITIVITY): Troponin I (High Sensitivity): 3 ng/L (ref <18)

## 2020-10-12 LAB — BRAIN NATRIURETIC PEPTIDE: B Natriuretic Peptide: 17.2 pg/mL (ref 0.0–100.0)

## 2020-10-12 MED ORDER — IPRATROPIUM-ALBUTEROL 0.5-2.5 (3) MG/3ML IN SOLN
3.0000 mL | Freq: Once | RESPIRATORY_TRACT | Status: AC
  Administered 2020-10-12: 3 mL via RESPIRATORY_TRACT

## 2020-10-12 MED ORDER — PREDNISONE 20 MG PO TABS: 40.0000 mg | ORAL_TABLET | Freq: Every day | ORAL | 0 refills | Status: AC

## 2020-10-12 MED ORDER — DOXYCYCLINE HYCLATE 100 MG PO CAPS: 100.0000 mg | ORAL_CAPSULE | Freq: Two times a day (BID) | ORAL | 0 refills | Status: AC

## 2020-10-12 MED ORDER — GUAIFENESIN-CODEINE 100-10 MG/5ML PO SOLN: 5.0000 mL | Freq: Every evening | ORAL | 0 refills | Status: DC | PRN

## 2020-10-12 MED ORDER — GUAIFENESIN ER 600 MG PO TB12: 600.0000 mg | ORAL_TABLET | Freq: Two times a day (BID) | ORAL | 0 refills | Status: AC | PRN

## 2020-10-12 MED ORDER — METHYLPREDNISOLONE SODIUM SUCC 125 MG IJ SOLR
60.0000 mg | Freq: Once | INTRAMUSCULAR | Status: AC
  Administered 2020-10-12: 60 mg via INTRAVENOUS
  Filled 2020-10-12: qty 2

## 2020-10-12 MED ORDER — ALBUTEROL SULFATE (2.5 MG/3ML) 0.083% IN NEBU
2.5000 mg | INHALATION_SOLUTION | Freq: Once | RESPIRATORY_TRACT | Status: AC
  Administered 2020-10-12: 2.5 mg via RESPIRATORY_TRACT

## 2020-10-12 NOTE — ED Provider Notes (Signed)
[facility] EMERGENCY DEPT Provider Note   CSN: [id number] Arrival date & time: 10/12/20  0539     History Chief Complaint  Patient presents with   Cough    Glenn [name] is a 60 y.o. male.  60 year old male with history as below including COPD presents the ER secondary to worsening cough.  Patient reports cough is been gradually worsening over the past month.  Sputum was initially green however has since transition to brown.  Worsening sputum production over the past week or so.  Worsening difficulty breathing on exertion.  Does feel like he is wheezing.  Been using his home bronchodilators as prescribed with mild improvement of symptoms however this is transient.  Follows with pulmonology as an outpatient.  No CPAP or BiPAP use at home.  No home oxygen use.  No fevers or chills.  No nausea or vomiting.  He is tolerant of oral intake without difficulty, denies change in bowel or bladder function.  Patient also with lump to his right chest wall.  Noticed it about 1 to 2 months ago.  Feels as though it is gradually increasing in size.  No pain with palpation.  Nonmobile.  The history is provided by the patient. No language interpreter was used.  Cough Associated symptoms: shortness of breath and wheezing   Associated symptoms: no chest pain, no chills, no fever, no headaches and no rash       Past Medical History:  Diagnosis Date   Arthritis    Asthma    Chronic pain 05/06/2020   COPD (chronic obstructive pulmonary disease) (HCC)    CVA (cerebral vascular accident) (HCC)    2010; 06/2020   Diabetes mellitus type 2 in nonobese [facility])    ED (erectile dysfunction)    Hypertension    Tobacco dependence 05/06/2020    Patient Active Problem List   Diagnosis Date Noted   Acute CVA (cerebrovascular accident) (HCC) 06/23/2020   Daytime sleepiness 06/09/2020   Malnutrition of moderate degree 05/07/2020   Chronic pain 05/06/2020   Tobacco dependence 05/06/2020    Hypertension    Diabetes mellitus type 2 in nonobese [facility])    COPD (chronic obstructive pulmonary disease) (HCC) 04/11/2020   Elevated troponin 04/11/2020   Acute on chronic respiratory failure with hypoxia (HCC) 04/11/2020   Elevated transaminase level    COPD with acute exacerbation (HCC) 06/30/2019   Acute respiratory failure with hypoxemia (HCC) 06/30/2019   Hypotension 06/30/2019   Near syncope 06/30/2019   Pleural nodule 06/30/2019   Colon cancer screening 12/18/2017   History of colonic polyps 12/18/2017   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   COPD GOLD  II 10/05/2016    Past Surgical History:  Procedure Laterality Date   LUNG SURGERY     removed tissue       Family History  Problem Relation Age of Onset   Hypertension Mother    Diabetes Maternal Grandmother    Colon cancer Neg Hx    Esophageal cancer Neg Hx    Rectal cancer Neg Hx    Inflammatory bowel disease Neg Hx    Liver disease Neg Hx    Pancreatic cancer Neg Hx     Social History   Tobacco Use   Smoking status: Former    Packs/day: 0.50    Years: 34.00    Pack years: 17.00    Types: Cigarettes    Quit date: 05/11/2020    Years since quitting: 0.4   Smokeless tobacco: Never   Tobacco comments:  at the most smoked 6-7 cigarettes/day  Vaping Use   Vaping Use: Never used  Substance Use Topics   Alcohol use: Never   Drug use: Never    Home Medications Prior to Admission medications   Medication Sig Start Date End Date Taking? Authorizing Provider  albuterol (PROVENTIL HFA) 108 (90 Base) MCG/ACT inhaler Inhale 2 puffs into the lungs every 4 (four) hours as needed for wheezing or shortness of breath. 11/20/18  Yes [name], MD  albuterol (PROVENTIL) (2.5 MG/3ML) 0.083% nebulizer solution PLEASE SEE ATTACHED FOR DETAILED DIRECTIONS Patient taking differently: Take 2.5 mg by nebulization every 6 (six) hours as needed for wheezing or shortness of breath. 02/26/18  Yes [name], MD  AMITIZA 24  MCG capsule Take 24 mcg by mouth at bedtime. 03/31/20  Yes [provider]  amLODipine (NORVASC) 10 MG tablet Take 10 mg by mouth daily.   Yes [provider]  atorvastatin (LIPITOR) 40 MG tablet Take 1 tablet (40 mg total) by mouth daily. 06/26/20  Yes Pokhrel, Laxman, MD  budesonide-formoterol (SYMBICORT) 160-4.5 MCG/ACT inhaler Inhale 2 puffs into the lungs 2 (two) times daily. 06/09/20  Yes Parrett, Virgel Bouquet, NP  cetirizine (ZYRTEC ALLERGY) 10 MG tablet Take 1 tablet (10 mg total) by mouth at bedtime. 06/09/20  Yes Parrett, Tammy S, NP  clopidogrel (PLAVIX) 75 MG tablet Take 1 tablet (75 mg total) by mouth daily. 06/26/20 06/26/21 Yes Pokhrel, Rebekah [place], MD  doxycycline (VIBRAMYCIN) 100 MG capsule Take 1 capsule (100 mg total) by mouth 2 (two) times daily for 5 days. 10/12/20 10/17/20 Yes Tanda Rockers A, DO  gabapentin (NEURONTIN) 300 MG capsule Take 1 capsule (300 mg total) by mouth 3 (three) times daily. 02/09/18  Yes Eustace Moore, MD  guaiFENesin (MUCINEX) 600 MG 12 hr tablet Take 1 tablet (600 mg total) by mouth 2 (two) times daily as needed for up to 5 days. 10/12/20 10/17/20 Yes Tanda Rockers A, DO  guaiFENesin-codeine 100-10 MG/5ML syrup Take 5 mLs by mouth at bedtime as needed for cough. 10/12/20  Yes Tanda Rockers A, DO  hydrOXYzine (ATARAX/VISTARIL) 25 MG tablet Take 25 mg by mouth 2 (two) times daily as needed for anxiety. 04/14/20  Yes [provider]  metFORMIN (GLUCOPHAGE) 500 MG tablet Take 500 mg by mouth every morning. 04/17/20  Yes [provider]  montelukast (SINGULAIR) 10 MG tablet Take 1 tablet (10 mg total) by mouth at bedtime. 06/09/20  Yes Parrett, Tammy S, NP  Multiple Vitamin (MULTIVITAMIN WITH MINERALS) TABS tablet Take 1 tablet by mouth daily. 05/10/20  Yes Briant Cedar, MD  predniSONE (DELTASONE) 20 MG tablet Take 2 tablets (40 mg total) by mouth daily for 4 days. 10/12/20 10/16/20 Yes Sloan Leiter, DO  benzonatate (TESSALON) 100 MG capsule Take 2  capsules (200 mg total) by mouth every 8 (eight) hours as needed for cough. 06/09/20   Parrett, Virgel Bouquet, NP  ferrous sulfate 325 (65 FE) MG tablet Take 1 tablet (325 mg total) by mouth daily with breakfast. 05/10/20 06/09/20  Briant Cedar, MD  hydrochlorothiazide (HYDRODIURIL) 12.5 MG tablet Take 12.5 mg by mouth daily. 03/26/20   [provider]  metaxalone (SKELAXIN) 800 MG tablet Take 1 tablet (800 mg total) by mouth 3 (three) times daily. Patient taking differently: Take 800 mg by mouth 3 (three) times daily as needed for muscle spasms. 01/30/20   Rhys Martini, PA-C  nicotine (NICODERM CQ) 14 mg/24hr patch Place 1 patch (14  mg total) onto the skin daily. 06/09/20   Parrett, Virgel Bouquet, NP  nitroGLYCERIN (NITROSTAT) 0.4 MG SL tablet Place 0.4 mg under the tongue every 5 (five) minutes as needed for chest pain.    [provider]  Oxycodone HCl 20 MG TABS Take 20 mg by mouth every 6 (six) hours as needed for pain. 03/31/20   [provider]  pantoprazole (PROTONIX) 40 MG tablet Take 1 tablet (40 mg total) by mouth daily. 07/02/19   Elgergawy, Leana Roe, MD  sildenafil (VIAGRA) 100 MG tablet Take 1 tablet (100 mg total) by mouth daily as needed. Patient taking differently: Take 100 mg by mouth daily as needed for erectile dysfunction. 05/01/20     tamsulosin (FLOMAX) 0.4 MG CAPS capsule Take 0.4 mg by mouth every evening. 06/26/19   [provider]  Tiotropium Bromide Monohydrate (SPIRIVA RESPIMAT) 2.5 MCG/ACT AERS 2 puffs each am 01/15/20   [name], MD  vitamin B-12 1000 MCG tablet Take 1 tablet (1,000 mcg total) by mouth daily. 05/10/20   Briant Cedar, MD  Vitamin D, Ergocalciferol, (DRISDOL) 1.25 MG (50000 UNIT) CAPS capsule Take 50,000 Units by mouth once a week. Mondays 05/02/19   [provider]    Allergies    Insulins, Ibuprofen, Shrimp [shellfish allergy], Tylenol [acetaminophen], and Lisinopril  Review of Systems   Review of Systems   Constitutional:  Negative for chills and fever.  HENT:  Negative for facial swelling and trouble swallowing.   Eyes:  Negative for photophobia and visual disturbance.  Respiratory:  Positive for cough, chest tightness, shortness of breath and wheezing.   Cardiovascular:  Negative for chest pain and palpitations.  Gastrointestinal:  Negative for abdominal pain, nausea and vomiting.  Endocrine: Negative for polydipsia and polyuria.  Genitourinary:  Negative for difficulty urinating and hematuria.  Musculoskeletal:  Negative for gait problem and joint swelling.  Skin:  Negative for pallor and rash.  Neurological:  Negative for syncope and headaches.  Psychiatric/Behavioral:  Negative for agitation and confusion.    Physical Exam Updated Vital Signs BP (!) 157/97   Pulse (!) 106   Temp 98.2 F (36.8 C) (Oral)   Resp (!) 25   Ht  (1.651 m)   Wt 65.8 kg   SpO2 96%   BMI 24.13 kg/m   Physical Exam Vitals and nursing note reviewed.  Constitutional:      General: He is not in acute distress.    Appearance: He is well-developed.  HENT:     Head: Normocephalic and atraumatic.     Right Ear: External ear normal.     Left Ear: External ear normal.     Mouth/Throat:     Mouth: Mucous membranes are moist.  Eyes:     General: No scleral icterus. Cardiovascular:     Rate and Rhythm: Normal rate and regular rhythm.     Pulses: Normal pulses.     Heart sounds: Normal heart sounds.  Pulmonary:     Effort: Pulmonary effort is normal. Tachypnea present. No accessory muscle usage or respiratory distress.     Breath sounds: Decreased air movement present. Wheezing present.     Comments: Wheezing, right greater than left Chest:    Abdominal:     General: Abdomen is flat.     Palpations: Abdomen is soft.     Tenderness: There is no abdominal tenderness.  Musculoskeletal:        General: Normal range of motion.  Cervical back: Normal range of motion.     Right lower leg: No  edema.     Left lower leg: No edema.  Skin:    General: Skin is warm and dry.     Capillary Refill: Capillary refill takes less than 2 seconds.  Neurological:     Mental Status: He is alert and oriented to person, place, and time.  Psychiatric:        Mood and Affect: Mood normal.        Behavior: Behavior normal.    ED Results / Procedures / Treatments   Labs (all labs ordered are listed, but only abnormal results are displayed) Labs Reviewed  BASIC METABOLIC PANEL - Abnormal; Notable for the following components:      Result Value   Glucose, Bld 250 (*)    Calcium 8.2 (*)    All other components within normal limits  CBC WITH DIFFERENTIAL/PLATELET  BRAIN NATRIURETIC PEPTIDE  TROPONIN I (HIGH SENSITIVITY)    EKG EKG Interpretation  Date/Time:  Monday October 12 2020 08:51:15 EDT Ventricular Rate:  100 PR Interval:  133 QRS Duration: 79 QT Interval:  345 QTC Calculation: 445 R Axis:   39 Text Interpretation: Sinus tachycardia Similar to prior tracing Confirmed by Tanda Rockers ([id number]) on 10/12/2020 9:53:58 AM  Radiology DG Chest 2 View  Result Date: 10/12/2020 CLINICAL DATA:  Progressive cough over the last month. History of asthma/COPD. EXAM: CHEST - 2 VIEW COMPARISON:  Radiographs 05/23/2020.  CT 04/13/2020. FINDINGS: The heart size and mediastinal contours are stable. There is stable volume loss in the left hemithorax with postsurgical changes related to previous partial lung resection bilaterally. Bilateral pulmonary scarring appears unchanged. No superimposed airspace disease, pleural effusion or pneumothorax identified. The bones appear unremarkable. Telemetry leads overlie the chest. IMPRESSION: Stable chronic lung disease and postsurgical changes. No acute cardiopulmonary process identified. Electronically Signed   By: Carey Bullocks M.D.   On: 10/12/2020 09:33    Procedures Procedures   Medications Ordered in ED Medications  ipratropium-albuterol (DUONEB) 0.5-2.5  (3) MG/3ML nebulizer solution 3 mL (3 mLs Nebulization Given 10/12/20 0859)  albuterol (PROVENTIL) (2.5 MG/3ML) 0.083% nebulizer solution 2.5 mg (2.5 mg Nebulization Given 10/12/20 0859)  methylPREDNISolone sodium succinate (SOLU-MEDROL) 125 mg/2 mL injection 60 mg (60 mg Intravenous Given 10/12/20 0909)    ED Course  I have reviewed the triage vital signs and the nursing notes.  Pertinent labs & imaging results that were available during my care of the patient were reviewed by me and considered in my medical decision making (see chart for details).    MDM Rules/Calculators/A&P                           This patient complains of cough, fatigue; this involves an extensive number of treatment Options and is a complaint that carries with it a high risk of complications and Morbidity. Vital signs reviewed and are stable. Serious etiologies considered.   Pt with wheezing on exam, he is not hypoxic. No acute respiratory distress. Will give duo-nebs and re-assess.  Labs reviewed and are overall stable. ECG reviewed without evidence of acute ischemia. Troponin is not elevated, no acute chest pain; ACS is less likely.  CXR is non-acute.  Given history of COPD and presenting factors today favor mild COPD exacerbation. Given stable respiratory status and ability to contract for safety/return precautions it is reasonable to trial outpatient therapy at this time. Recommend steroids and  doxycycline per GOLD criteria. Add anti-tussive. Strict return precautions discussed with pt and family, they will f/u with pulmonary in the next few days.  The patient improved significantly and was discharged in stable condition. Detailed discussions were had with the patient regarding current findings, and need for close f/u with PCP or on call doctor. The patient has been instructed to return immediately if the symptoms worsen in any way for re-evaluation. Patient verbalized understanding and is in agreement with current  care plan. All questions answered prior to discharge.   Final Clinical Impression(s) / ED Diagnoses Final diagnoses:  Cough  Chronic obstructive pulmonary disease, unspecified COPD type (HCC)    Rx / DC Orders ED Discharge Orders          Ordered    doxycycline (VIBRAMYCIN) 100 MG capsule  2 times daily        10/12/20 1013    guaiFENesin-codeine 100-10 MG/5ML syrup  At bedtime PRN        10/12/20 1013    guaiFENesin (MUCINEX) 600 MG 12 hr tablet  2 times daily PRN        10/12/20 1013    predniSONE (DELTASONE) 20 MG tablet  Daily        10/12/20 1013             Tanda RockersGray, Kitt Minardi A, DO 10/13/20 1402

## 2020-10-12 NOTE — ED Triage Notes (Signed)
Pt presents with cough for over a month, productive brown secretions. Pt also has a lump on rt chest that is sore. Labored breathing without wheezing.

## 2020-10-19 ENCOUNTER — Other Ambulatory Visit (HOSPITAL_COMMUNITY): Payer: Self-pay

## 2020-11-11 ENCOUNTER — Ambulatory Visit (INDEPENDENT_AMBULATORY_CARE_PROVIDER_SITE_OTHER): Payer: Medicaid Other | Admitting: Internal Medicine

## 2020-11-11 ENCOUNTER — Other Ambulatory Visit: Payer: Self-pay

## 2020-11-11 ENCOUNTER — Encounter: Payer: Self-pay | Admitting: Internal Medicine

## 2020-11-11 DIAGNOSIS — J449 Chronic obstructive pulmonary disease, unspecified: Secondary | ICD-10-CM | POA: Diagnosis not present

## 2020-11-11 MED ORDER — ALBUTEROL SULFATE HFA 108 (90 BASE) MCG/ACT IN AERS: INHALATION_SPRAY | RESPIRATORY_TRACT | 11 refills | Status: DC

## 2020-11-11 NOTE — Patient Instructions (Addendum)
I very strongly recommend you get the moderna or pfizer vaccine as soon as possible based on your risk of dying from the virus  and the proven safety and benefit of these vaccines against even the delta and omicron variants.  This can save your life as well as  those of your loved ones,  especially if they are also not vaccinated.    Plan A = Automatic = Always=    Symbicort 160 Take 2 puffs first thing in am and then another 2 puffs about 12 hours later.  And spiriva 2 puffs each am  Work on inhaler technique:  relax and gently blow all the way out then take a nice smooth full deep breath back in, triggering the inhaler at same time you start breathing in.  Hold for up to 5 seconds if you can. Blow out thru nose. Rinse and gargle with water when done.  If mouth or throat bother you at all,  try brushing teeth/gums/tongue with arm and hammer toothpaste/ make a slurry and gargle and spit out.   Remember how golfers take practice swings     Plan B = Backup (to supplement plan A, not to replace it) Only use your albuterol inhaler as a rescue medication to be used if you can't catch your breath by resting or doing a relaxed purse lip breathing pattern.  - The less you use it, the better it will work when you need it. - Ok to use the inhaler up to 2 puffs  every 4 hours if you must but call for appointment if use goes up over your usual need - Don't leave home without it !!  (think of it like starter fluid for your car)   Plan C = Crisis (instead of Plan B but only if Plan B stops working) - only use your albuterol nebulizer (8x stronger)  if you first try Plan B and it fails to help > ok to use the nebulizer up to every 4 hours but if start needing it regularly call for immediate appointment  Followup with Dr Sherene Sires in 6 months, sooner if needed.

## 2020-11-11 NOTE — Progress Notes (Signed)
Subjective:     Patient ID: Glenn [name], male   DOB: [birth year]     MRN: [id number]      Brief patient profile:  6 yobm quit smoking 09/2020   with onset of symptoms in 1995 while in [place] dx as asthma vs sarcoid and rx pred/ theoph / albuterol seen by allergist dx as pollen moved to [place] where underwent bilateral lung surgery 2011 ? Lung vol reduction ? Helped breathing at the time but uses [facility] parking and  [facility] = can't walk a nl pace on a flat grade s sob but does fine slow and flat so referred to pulmonary clinic 10/05/2016 by Dr  Tyson Dense      History of Present Illness  10/05/2016 1st [facility] office visit/ [name]   Chief Complaint  Patient presents with   Pulmonary Consult    Referred by Dr. Alveta Heimlich for eval of Asthma and COPD. Pt states he was dxed with COPD and Asthma in 1995. He moved here a year ago from [place], [place]. He states his breathing is "okay" today. He states he has a "slight cold"- prod cough with minimal brown sputum.    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  Work on [employer] B = Backup Only use your albuterol (proair) as a rescue medication Plan C = Crisis - only use your albuterol nebulizer if you first try Plan B and it fails to help > ok to use the nebulizer up to every 4 hours but if start needing it regularly call for immediate appointment Please schedule a follow up office visit in 6 weeks, call sooner if needed with pfts    01/15/2020  f/u ov/[name] re:  Copd II / still  smoking / no really on maint symb/ doesn't remember rx with spiriva previously  Chief Complaint  Patient presents with   Follow-up    Breathing is overall doing well. He states that he uses his albuterol inhaler about every 4 hours and uses neb every night.   Dyspnea:  MMRC2 = can't walk a nl pace on a flat grade s sob but does fine slow and flat  Cough: none  Sleeping: 3 h p lie down needs nebulizer  SABA use: way too much /totally confused on maint vs prns 02: none  Prednisone always helps and when it does he tends to stop everything not understanding the difference between maint and prns Rec Plan A = Automatic = Always=    symbicort 160 Take 2 puffs first thing in am and then another 2 puffs about 12 hours later and spiriva 2 pffs each am  Prednisone 10 mg take  4 each am x 2 days,   2 each am x 2 days,  1 each am x 2 days and stop   Plan B = Backup (to supplement plan A,  not to replace it) Only use your albuterol inhaler as a rescue medication Plan C = Crisis (instead of Plan B but only if Plan B stops working) - only use your albuterol nebulizer if you first try Plan B and it fails to help > ok to use the nebulizer up to every 4 hours but if start needing it regularly call for immediate appointment The key is to stop smoking completely before smoking completely stops you! I very strongly recommend you get the moderna or pfizer vaccine  If you are satisfied with your treatment plan,  let your doctor know and he/she can either refill your medications or you can return here when your prescription runs out but you must bring all active medications with you if you want my help.  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 Lung vol reduction)  Chief Complaint  Patient presents with   Follow-up    Go over PFT. Patient denies any concerns,    Dyspnea:  MMRC1 = can walk nl pace, flat grade, can't hurry or go uphills or steps  s sob   Cough: none  Sleeping: no resp cc SABA use: confused  02: none  Covid status:   never vax /  never infected  Rec Plan A = Automatic = Always=    symbicort 160 Take 2 puffs first thing in am and then another 2 puffs about 12 hours later and spiriva 2 pffs each am  Work on inhaler technique:  Plan B = Backup (to supplement plan A, not to replace it) Only use your albuterol inhaler as a rescue medication  Plan C = Crisis (instead of Plan B but only if Plan B stops working) - only use your albuterol nebulizer if you first try Plan B and it fails to help   I very strongly recommend you get the moderna or pfizer vaccine   Please schedule a follow up visit in 3 months but call sooner if needed  with all medications /inhalers/ solutions in hand so we can verify exactly what you are taking. This includes all medications from all doctors and over the counters    11/11/2020  f/u ov/[name] re: quit smoking 09/2020 copd 2/ bronchiectasis s/p remote resection ? Lung vol reduction  maint on symbicort/spiriva  Chief Complaint  Patient presents with   Follow-up    SOB has worsened, patient believes he's getting a cold. Has a dry cough.  Dyspnea:  baseline = still MMRC2 = can't walk a nl pace on a flat grade s sob but does fine slow and flat eg parking lot  Cough: several days  Sleeping: no resp cc / bed is flat variable pillows  SABA use: neb once or twice a month/ maybe twice a week  02: none  Covid status:   none/ infected omicron   No obvious day to day or daytime variability or assoc excess/ purulent sputum or mucus plugs or hemoptysis or cp or chest tightness, subjective wheeze or overt sinus or hb symptoms.   Sleeping as above  without nocturnal  or early am exacerbation  of respiratory  c/o's or need for noct saba. Also denies any obvious fluctuation of symptoms with weather or environmental changes or other aggravating or alleviating factors except as outlined above   No unusual exposure  hx or h/o childhood pna/ asthma or knowledge of premature birth.  Current Allergies, Complete Past  Medical History, Past Surgical History, Family History, and Social History were reviewed in [employer] record.  ROS  The following are not active complaints unless bolded Hoarseness, sore throat, dysphagia, dental problems, itching, sneezing,  nasal congestion or discharge of excess mucus or purulent secretions, ear ache,   fever, chills, sweats, unintended wt loss or wt gain, classically pleuritic or exertional cp,  orthopnea pnd or arm/hand swelling  or leg swelling, presyncope, palpitations, abdominal pain, anorexia, nausea, vomiting, diarrhea  or change in bowel habits or change in bladder habits, change in stools or change in urine, dysuria, hematuria,  rash, arthralgias, visual complaints, headache, numbness, weakness or ataxia or problems with walking or coordination,  change in mood or  memory.        Current Meds  Medication Sig   albuterol (PROVENTIL HFA) 108 (90 Base) MCG/ACT inhaler Inhale 2 puffs into the lungs every 4 (four) hours as needed for wheezing or shortness of breath.   albuterol (PROVENTIL) (2.5 MG/3ML) 0.083% nebulizer solution PLEASE SEE ATTACHED FOR DETAILED DIRECTIONS (Patient taking differently: Take 2.5 mg by nebulization every 6 (six) hours as needed for wheezing or shortness of breath.)   AMITIZA 24 MCG capsule Take 24 mcg by mouth at bedtime.   amLODipine (NORVASC) 10 MG tablet Take 10 mg by mouth daily.   atorvastatin (LIPITOR) 40 MG tablet Take 1 tablet (40 mg total) by mouth daily.   benzonatate (TESSALON) 100 MG capsule Take 2 capsules (200 mg total) by mouth every 8 (eight) hours as needed for cough.   budesonide-formoterol (SYMBICORT) 160-4.5 MCG/ACT inhaler Inhale 2 puffs into the lungs 2 (two) times daily.   cetirizine (ZYRTEC ALLERGY) 10 MG tablet Take 1 tablet (10 mg total) by mouth at bedtime.   clopidogrel (PLAVIX) 75 MG tablet Take  1 tablet (75 mg total) by mouth daily.   gabapentin (NEURONTIN) 300 MG capsule Take 1 capsule (300 mg total) by mouth 3 (three) times daily.   guaiFENesin-codeine 100-10 MG/5ML syrup Take 5 mLs by mouth at bedtime as needed for cough.   hydrochlorothiazide (HYDRODIURIL) 12.5 MG tablet Take 12.5 mg by mouth daily.   hydrOXYzine (ATARAX/VISTARIL) 25 MG tablet Take 25 mg by mouth 2 (two) times daily as needed for anxiety.   metaxalone (SKELAXIN) 800 MG tablet Take 1 tablet (800 mg total) by mouth 3 (three) times daily. (Patient taking differently: Take 800 mg by mouth 3 (three) times daily as needed for muscle spasms.)   metFORMIN (GLUCOPHAGE) 500 MG tablet Take 500 mg by mouth every morning.   montelukast (SINGULAIR) 10 MG tablet Take 1 tablet (10 mg total) by mouth at bedtime.   Multiple Vitamin (MULTIVITAMIN WITH MINERALS) TABS tablet Take 1 tablet by mouth daily.   nicotine (NICODERM CQ) 14 mg/24hr patch Place 1 patch (14 mg total) onto the skin daily.   nitroGLYCERIN (NITROSTAT) 0.4 MG SL tablet Place 0.4 mg under the tongue every 5 (five) minutes as needed for chest pain.   Oxycodone HCl 20 MG TABS Take 20 mg by mouth every 6 (six) hours as needed for pain.   pantoprazole (PROTONIX) 40 MG tablet Take 1 tablet (40 mg total) by mouth daily.   sildenafil (VIAGRA) 100 MG tablet Take 1 tablet (100 mg total) by mouth daily as needed. (Patient taking differently: Take 100 mg by mouth daily as needed for erectile dysfunction.)   tamsulosin (FLOMAX) 0.4 MG CAPS capsule Take 0.4 mg by mouth every evening.   Tiotropium Bromide Monohydrate (SPIRIVA  RESPIMAT) 2.5 MCG/ACT AERS 2 puffs each am   vitamin B-12 1000 MCG tablet Take 1 tablet (1,000 mcg total) by mouth daily.   Vitamin D, Ergocalciferol, (DRISDOL) 1.25 MG (50000 UNIT) CAPS capsule Take 50,000 Units by mouth once a week. Mondays                   Objective:   Physical Exam   11/11/2020        147  08/11/2020          144 01/15/2020         140 03/26/2019        144  11/20/2018      144  08/14/2018          144  02/08/2018          143  11/22/17 142 lb (64.4 kg)  10/05/16 131 lb 12.8 oz (59.8 kg)     Vital signs reviewed  11/11/2020  - Note at rest 02 sats  95% on RA   General appearance:    amb bm nad / easily confused with names of meds   HEENT : pt wearing mask not removed for exam due to covid -19 concerns.    NECK :  without JVD/Nodes/TM/ nl carotid upstrokes bilaterally   LUNGS: no acc muscle use,  Mod barrel  contour chest wall with bilateral  Distant bs esp on L  s audible wheeze and  without cough on insp or exp maneuvers and mod  Hyperresonant  to  percussion bilaterally     CV:  RRR  no s3 or murmur or increase in P2, and no edema   ABD:  soft and nontender with pos mid insp Hoover's  in the supine position. No bruits or organomegaly appreciated, bowel sounds nl  MS:     ext warm without deformities, calf tenderness, cyanosis or clubbing No obvious joint restrictions   SKIN: warm and dry without lesions    NEURO:  alert, approp, nl sensorium with  no motor or cerebellar deficits apparent.             I personally reviewed images and agree with radiology impression as follows:  CXR:   10/12/20 Stable chronic lung disease and postsurgical changes. No acute cardiopulmonary process identified.  Assessment:

## 2020-11-11 NOTE — Assessment & Plan Note (Addendum)
Quit smoking 05/2020    - Spirometry 10/05/2016  FEV1 1.36 (52%)  Ratio 65 p saba with classic curvature - 10/05/2016  > try dulera 200 2bid   - Allergy profile 11/22/2017 >  Eos 0.6 /  IgE  25 RAST neg x for roaches/ neg for mold  - 11/22/2017   continue dulera 200 2bid  - PFT's  02/08/2018  FEV1 0.90 (36 % ) ratio 59   p multiple saba prior to study with DLCO  52 % corrects to 106  % for alv volume   02/08/2018  After extensive coaching inhaler device,  effectiveness =    90% from a baseline of 75% with smi so try spiriva respimat - 08/14/2018    changed to trelegy but preferred symb/spriva - 11/20/2018     Breztri > better but not covered by medicaid - 03/26/2019  After extensive coaching inhaler device,  effectiveness =    75% (short Ti) > rechallenge with Breztri > not covered, resumed symb 160 but only took prn - 01/15/2020 re-started on symb 160/spiriva 2.5 daily  - 08/11/2020  After extensive coaching inhaler device,  effectiveness =    75% > continue symbicort/ spiriva - PFT's  08/11/2020  FEV1 1.34 (54 % ) ratio 0.61  p 10 % improvement from saba p spiriva prior to study with DLCO  11.94 (52%) corrects to 3.41 (77%)  for alv volume and FV curve classic concavity   - 11/11/2020  After extensive coaching inhaler device,  effectiveness =  75% (short ti)      Group D in terms of symptom/risk and laba/lama/ICS  therefore appropriate rx at this point >>>  Symb/spiriva plus prn saba  Re SABA :  I spent extra time with pt today reviewing appropriate use of albuterol for prn use on exertion with the following points: 1) saba is for relief of sob that does not improve by walking a slower pace or resting but rather if the pt does not improve after trying this first. 2) If the pt is convinced, as many are, that saba helps recover from activity faster then it's easy to tell if this is the case by re-challenging : ie stop, take the inhaler, then p 5 minutes try the exact same activity (intensity of workload) that  just caused the symptoms and see if they are substantially diminished or not after saba 3) if there is an activity that reproducibly causes the symptoms, try the saba 15 min before the activity on alternate days   If in fact the saba really does help, then fine to continue to use it prn but advised may need to look closer at the maintenance regimen being used to achieve better control of airways disease with exertion.   Strongly suspect non-adherence with "Maint rx" with all meds in hand using a trust but verify approach to confirm accurate Medication  Reconciliation The principal here is that until we are certain that the  patients are doing what we've asked, it makes no sense to ask them to do more.   >>> f/u with all meds in hand q 6 m, sooner if needed          Each maintenance medication was reviewed in detail including emphasizing most importantly the difference between maintenance and prns and under what circumstances the prns are to be triggered using an action plan format where appropriate.  Total time for H and P, chart review, counseling, reviewing hfa/smi device(s) and generating customized AVS  unique to this office visit / same day charting  > 30 min

## 2020-11-17 ENCOUNTER — Other Ambulatory Visit (HOSPITAL_COMMUNITY): Payer: Self-pay

## 2020-11-17 MED ORDER — SILDENAFIL CITRATE 100 MG PO TABS
100.0000 mg | ORAL_TABLET | Freq: Every day | ORAL | 5 refills | Status: DC | PRN
  Filled 2020-11-17: qty 30, 30d supply, fill #0
  Filled 2020-12-17: qty 30, 30d supply, fill #1
  Filled 2021-01-14: qty 30, 30d supply, fill #2
  Filled 2021-02-22: qty 30, 30d supply, fill #3
  Filled 2021-03-31: qty 30, 30d supply, fill #4
  Filled 2021-04-27: qty 30, 30d supply, fill #5

## 2020-11-18 ENCOUNTER — Encounter (HOSPITAL_BASED_OUTPATIENT_CLINIC_OR_DEPARTMENT_OTHER): Payer: Self-pay | Admitting: Emergency Medicine

## 2020-11-18 ENCOUNTER — Emergency Department (HOSPITAL_BASED_OUTPATIENT_CLINIC_OR_DEPARTMENT_OTHER): Payer: Medicaid Other

## 2020-11-18 ENCOUNTER — Other Ambulatory Visit: Payer: Self-pay

## 2020-11-18 ENCOUNTER — Emergency Department (HOSPITAL_BASED_OUTPATIENT_CLINIC_OR_DEPARTMENT_OTHER)
Admission: EM | Admit: 2020-11-18 | Discharge: 2020-11-18 | Disposition: A | Discharge status: Home / Self Care | Payer: Medicaid Other | Attending: Emergency Medicine | Admitting: Emergency Medicine

## 2020-11-18 ENCOUNTER — Other Ambulatory Visit (HOSPITAL_COMMUNITY): Payer: Self-pay

## 2020-11-18 DIAGNOSIS — I10 Essential (primary) hypertension: Secondary | ICD-10-CM | POA: Insufficient documentation

## 2020-11-18 DIAGNOSIS — E119 Type 2 diabetes mellitus without complications: Secondary | ICD-10-CM | POA: Insufficient documentation

## 2020-11-18 DIAGNOSIS — Z7951 Long term (current) use of inhaled steroids: Secondary | ICD-10-CM | POA: Diagnosis not present

## 2020-11-18 DIAGNOSIS — J441 Chronic obstructive pulmonary disease with (acute) exacerbation: Secondary | ICD-10-CM | POA: Diagnosis not present

## 2020-11-18 DIAGNOSIS — Z79899 Other long term (current) drug therapy: Secondary | ICD-10-CM | POA: Diagnosis not present

## 2020-11-18 DIAGNOSIS — J45901 Unspecified asthma with (acute) exacerbation: Secondary | ICD-10-CM | POA: Diagnosis not present

## 2020-11-18 DIAGNOSIS — Z85038 Personal history of other malignant neoplasm of large intestine: Secondary | ICD-10-CM | POA: Diagnosis not present

## 2020-11-18 DIAGNOSIS — Z87891 Personal history of nicotine dependence: Secondary | ICD-10-CM | POA: Diagnosis not present

## 2020-11-18 DIAGNOSIS — Z7984 Long term (current) use of oral hypoglycemic drugs: Secondary | ICD-10-CM | POA: Diagnosis not present

## 2020-11-18 DIAGNOSIS — Z20822 Contact with and (suspected) exposure to covid-19: Secondary | ICD-10-CM | POA: Diagnosis not present

## 2020-11-18 DIAGNOSIS — R0602 Shortness of breath: Secondary | ICD-10-CM | POA: Diagnosis present

## 2020-11-18 LAB — CBC WITH DIFFERENTIAL/PLATELET
Abs Immature Granulocytes: 0.02 10*3/uL (ref 0.00–0.07)
Basophils Absolute: 0.1 10*3/uL (ref 0.0–0.1)
Basophils Relative: 1 %
Eosinophils Absolute: 0.2 10*3/uL (ref 0.0–0.5)
Eosinophils Relative: 3 %
HCT: 41.6 % (ref 39.0–52.0)
Hemoglobin: 13.3 g/dL (ref 13.0–17.0)
Immature Granulocytes: 0 %
Lymphocytes Relative: 33 %
Lymphs Abs: 2.3 10*3/uL (ref 0.7–4.0)
MCH: 28.5 pg (ref 26.0–34.0)
MCHC: 32 g/dL (ref 30.0–36.0)
MCV: 89.1 fL (ref 80.0–100.0)
Monocytes Absolute: 0.7 10*3/uL (ref 0.1–1.0)
Monocytes Relative: 10 %
Neutro Abs: 3.8 10*3/uL (ref 1.7–7.7)
Neutrophils Relative %: 53 %
Platelets: 253 10*3/uL (ref 150–400)
RBC: 4.67 MIL/uL (ref 4.22–5.81)
RDW: 14.4 % (ref 11.5–15.5)
WBC: 7 10*3/uL (ref 4.0–10.5)
nRBC: 0 % (ref 0.0–0.2)

## 2020-11-18 LAB — COMPREHENSIVE METABOLIC PANEL
ALT: 16 U/L (ref 0–44)
AST: 18 U/L (ref 15–41)
Albumin: 3.5 g/dL (ref 3.5–5.0)
Alkaline Phosphatase: 56 U/L (ref 38–126)
Anion gap: 7 (ref 5–15)
BUN: 6 mg/dL — ABNORMAL LOW (ref 8–23)
CO2: 29 mmol/L (ref 22–32)
Calcium: 8.3 mg/dL — ABNORMAL LOW (ref 8.9–10.3)
Chloride: 102 mmol/L (ref 98–111)
Creatinine, Ser: 0.78 mg/dL (ref 0.61–1.24)
GFR, Estimated: >60 mL/min (ref >60)
Glucose, Bld: 161 mg/dL — ABNORMAL HIGH (ref 70–99)
Potassium: 3.4 mmol/L — ABNORMAL LOW (ref 3.5–5.1)
Sodium: 138 mmol/L (ref 135–145)
Total Bilirubin: 0.3 mg/dL (ref 0.3–1.2)
Total Protein: 5.4 g/dL — ABNORMAL LOW (ref 6.5–8.1)

## 2020-11-18 LAB — RESP PANEL BY RT-PCR (FLU A&B, COVID) ARPGX2
Influenza A by PCR: NEGATIVE
Influenza B by PCR: NEGATIVE
SARS Coronavirus 2 by RT PCR: NEGATIVE

## 2020-11-18 LAB — BRAIN NATRIURETIC PEPTIDE: B Natriuretic Peptide: 9.8 pg/mL (ref 0.0–100.0)

## 2020-11-18 MED ORDER — PREDNISONE 10 MG PO TABS: 40.0000 mg | ORAL_TABLET | Freq: Every day | ORAL | 0 refills | Status: AC

## 2020-11-18 MED ORDER — IPRATROPIUM-ALBUTEROL 0.5-2.5 (3) MG/3ML IN SOLN
3.0000 mL | Freq: Once | RESPIRATORY_TRACT | Status: AC
  Administered 2020-11-18: 3 mL via RESPIRATORY_TRACT
  Filled 2020-11-18: qty 3

## 2020-11-18 MED ORDER — ALBUTEROL SULFATE (2.5 MG/3ML) 0.083% IN NEBU
2.5000 mg | INHALATION_SOLUTION | Freq: Once | RESPIRATORY_TRACT | Status: AC
  Administered 2020-11-18: 2.5 mg via RESPIRATORY_TRACT
  Filled 2020-11-18: qty 3

## 2020-11-18 MED ORDER — GUAIFENESIN-CODEINE 100-10 MG/5ML PO SYRP: 5.0000 mL | ORAL_SOLUTION | Freq: Three times a day (TID) | ORAL | 0 refills | Status: DC | PRN

## 2020-11-18 MED ORDER — METHYLPREDNISOLONE SODIUM SUCC 125 MG IJ SOLR
125.0000 mg | Freq: Once | INTRAMUSCULAR | Status: AC
  Administered 2020-11-18: 125 mg via INTRAVENOUS
  Filled 2020-11-18: qty 2

## 2020-11-18 NOTE — Discharge Instructions (Signed)
It was a pleasure caring for you today!  I have given you a prescription for steroids for the next 4 days, cough syrup and recommend you take the tessalon pearls previously prescribed.  Please follow up with your pulmonologist and primary care physician and return to the ED for new or worsening symptoms.

## 2020-11-18 NOTE — ED Notes (Signed)
Patient arrived to the ED w/ cc of shob. Per patient/family, hx of COPD, asthma, and sarcoidosis. Patient self-administered 2 puffs of home albuterol MDI on arrival and 2 additional puffs prior to neb administration. Patient tachypneic, tachycardic, and shob at arrival w/ I/E wheezing noted bilaterally; decreased aeration noted at initial assessment as well. Patient was given 5 mg albuterol and 0.5 mg of atrovent with improved aeration throughout and wheezing still noted on ascultation. Patient HR and RR improved post-treatment. RT will continue to monitor patient and respiratory needs throughout encounter.

## 2020-11-18 NOTE — ED Triage Notes (Signed)
Cough x 1 month , shortness of breath., denies chest pain. Hx asthma.

## 2020-11-18 NOTE — ED Provider Notes (Signed)
[facility] EMERGENCY DEPT Provider Note   CSN: [id number] Arrival date & time: 11/18/20  0847     History No chief complaint on file.   Glenn [name] is a 60 y.o. male.  HPI     60yo male with history of asthma, possible sarcoidosis, COPD, CVA, DM, hypertension, who presents with concern for one week of worsening shortness of breath.  Has had cough/wheezing/dyspnea for one month but worse over last week. Using albuterol 2-4 times/day Dyspnea and wheezing worse at night Coughing, productive of brown sputum No fevers, no leg pain or swelling No chest pain No abd pain/diarrhea/black or bloody stools Has nasal congestion, feels like he has a cold. Requesting cough medication.      Past Medical History:  Diagnosis Date   Arthritis    Asthma    Chronic pain 05/06/2020   COPD (chronic obstructive pulmonary disease) (HCC)    CVA (cerebral vascular accident) (HCC)    2010; 06/2020   Diabetes mellitus type 2 in nonobese [facility])    ED (erectile dysfunction)    Hypertension    Tobacco dependence 05/06/2020    Patient Active Problem List   Diagnosis Date Noted   Acute CVA (cerebrovascular accident) (HCC) 06/23/2020   Daytime sleepiness 06/09/2020   Malnutrition of moderate degree 05/07/2020   Chronic pain 05/06/2020   Tobacco dependence 05/06/2020   Hypertension    Diabetes mellitus type 2 in nonobese [facility])    COPD (chronic obstructive pulmonary disease) (HCC) 04/11/2020   Elevated troponin 04/11/2020   Acute on chronic respiratory failure with hypoxia (HCC) 04/11/2020   Elevated transaminase level    COPD with acute exacerbation (HCC) 06/30/2019   Acute respiratory failure with hypoxemia (HCC) 06/30/2019   Hypotension 06/30/2019   Near syncope 06/30/2019   Pleural nodule 06/30/2019   Colon cancer screening 12/18/2017   History of colonic polyps 12/18/2017   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   COPD GOLD  II 10/05/2016    Past Surgical History:  Procedure  Laterality Date   LUNG SURGERY     removed tissue       Family History  Problem Relation Age of Onset   Hypertension Mother    Diabetes Maternal Grandmother    Colon cancer Neg Hx    Esophageal cancer Neg Hx    Rectal cancer Neg Hx    Inflammatory bowel disease Neg Hx    Liver disease Neg Hx    Pancreatic cancer Neg Hx     Social History   Tobacco Use   Smoking status: Former    Packs/day: 0.50    Years: 34.00    Pack years: 17.00    Types: Cigarettes    Quit date: 05/11/2020    Years since quitting: 0.5   Smokeless tobacco: Never   Tobacco comments:    at the most smoked 6-7 cigarettes/day  Vaping Use   Vaping Use: Never used  Substance Use Topics   Alcohol use: Never   Drug use: Never    Home Medications Prior to Admission medications   Medication Sig Start Date End Date Taking? Authorizing Provider  guaiFENesin-codeine (ROBITUSSIN AC) 100-10 MG/5ML syrup Take 5 mLs by mouth 3 (three) times daily as needed for cough. 11/18/20  Yes Alvira Monday, MD  predniSONE (DELTASONE) 10 MG tablet Take 4 tablets (40 mg total) by mouth daily for 4 days. 11/18/20 11/22/20 Yes Emberli Ballester, Denny Peon, MD  albuterol (PROAIR HFA) 108 (90 Base) MCG/ACT inhaler 2 puffs every 4 hours as needed only  if your can't catch your breath 11/11/20   [name], MD  albuterol (PROVENTIL) (2.5 MG/3ML) 0.083% nebulizer solution PLEASE SEE ATTACHED FOR DETAILED DIRECTIONS Patient taking differently: Take 2.5 mg by nebulization every 6 (six) hours as needed for wheezing or shortness of breath. 02/26/18   [name], MD  AMITIZA 24 MCG capsule Take 24 mcg by mouth at bedtime. 03/31/20   [provider]  amLODipine (NORVASC) 10 MG tablet Take 10 mg by mouth daily.    [provider]  atorvastatin (LIPITOR) 40 MG tablet Take 1 tablet (40 mg total) by mouth daily. 06/26/20   Pokhrel, Rebekah Chesterfield, MD  budesonide-formoterol (SYMBICORT) 160-4.5 MCG/ACT inhaler Inhale 2 puffs into the lungs 2  (two) times daily. 06/09/20   Parrett, Virgel Bouquet, NP  cetirizine (ZYRTEC ALLERGY) 10 MG tablet Take 1 tablet (10 mg total) by mouth at bedtime. 06/09/20   Parrett, Virgel Bouquet, NP  clopidogrel (PLAVIX) 75 MG tablet Take 1 tablet (75 mg total) by mouth daily. 06/26/20 06/26/21  Pokhrel, Rebekah Chesterfield, MD  ferrous sulfate 325 (65 FE) MG tablet Take 1 tablet (325 mg total) by mouth daily with breakfast. 05/10/20 06/09/20  Briant Cedar, MD  gabapentin (NEURONTIN) 300 MG capsule Take 1 capsule (300 mg total) by mouth 3 (three) times daily. 02/09/18   Eustace Moore, MD  hydrochlorothiazide (HYDRODIURIL) 12.5 MG tablet Take 12.5 mg by mouth daily. 03/26/20   [provider]  hydrOXYzine (ATARAX/VISTARIL) 25 MG tablet Take 25 mg by mouth 2 (two) times daily as needed for anxiety. 04/14/20   [provider]  metaxalone (SKELAXIN) 800 MG tablet Take 1 tablet (800 mg total) by mouth 3 (three) times daily. Patient taking differently: Take 800 mg by mouth 3 (three) times daily as needed for muscle spasms. 01/30/20   Rhys Martini, PA-C  metFORMIN (GLUCOPHAGE) 500 MG tablet Take 500 mg by mouth every morning. 04/17/20   [provider]  montelukast (SINGULAIR) 10 MG tablet Take 1 tablet (10 mg total) by mouth at bedtime. 06/09/20   Parrett, Virgel Bouquet, NP  Multiple Vitamin (MULTIVITAMIN WITH MINERALS) TABS tablet Take 1 tablet by mouth daily. 05/10/20   Briant Cedar, MD  nicotine (NICODERM CQ) 14 mg/24hr patch Place 1 patch (14 mg total) onto the skin daily. 06/09/20   Parrett, Virgel Bouquet, NP  nitroGLYCERIN (NITROSTAT) 0.4 MG SL tablet Place 0.4 mg under the tongue every 5 (five) minutes as needed for chest pain.    [provider]  Oxycodone HCl 20 MG TABS Take 20 mg by mouth every 6 (six) hours as needed for pain. 03/31/20   [provider]  pantoprazole (PROTONIX) 40 MG tablet Take 1 tablet (40 mg total) by mouth daily. 07/02/19   Elgergawy, Leana Roe, MD  sildenafil (VIAGRA) 100 MG  tablet Take 1 tablet (100 mg total) by mouth daily as needed. 11/17/20     tamsulosin (FLOMAX) 0.4 MG CAPS capsule Take 0.4 mg by mouth every evening. 06/26/19   [provider]  Tiotropium Bromide Monohydrate (SPIRIVA RESPIMAT) 2.5 MCG/ACT AERS 2 puffs each am 01/15/20   [name], MD  vitamin B-12 1000 MCG tablet Take 1 tablet (1,000 mcg total) by mouth daily. 05/10/20   Briant Cedar, MD  Vitamin D, Ergocalciferol, (DRISDOL) 1.25 MG (50000 UNIT) CAPS capsule Take 50,000 Units by mouth once a week. Mondays 05/02/19   [provider]    Allergies    Insulins, Ibuprofen, Shrimp [shellfish allergy],  Tylenol [acetaminophen], and Lisinopril  Review of Systems   Review of Systems  Constitutional:  Negative for fever.  HENT:  Positive for congestion. Negative for sore throat.   Eyes:  Negative for visual disturbance.  Respiratory:  Positive for cough, shortness of breath and wheezing.   Cardiovascular:  Negative for chest pain and leg swelling.  Gastrointestinal:  Negative for abdominal pain, nausea and vomiting.  Genitourinary:  Negative for difficulty urinating.  Musculoskeletal:  Negative for back pain and neck stiffness.  Skin:  Negative for rash.  Neurological:  Negative for syncope and headaches.   Physical Exam Updated Vital Signs BP 128/90 (BP Location: Right Arm)   Pulse (!) 102   Temp 98.3 F (36.8 C) (Oral)   Resp 20   Ht 5\' 5"  (1.651 m)   Wt 66.2 kg   SpO2 93%   BMI 24.30 kg/m   Physical Exam Vitals and nursing note reviewed.  Constitutional:      General: He is not in acute distress.    Appearance: He is well-developed. He is not diaphoretic.  HENT:     Head: Normocephalic and atraumatic.  Eyes:     Conjunctiva/sclera: Conjunctivae normal.  Cardiovascular:     Rate and Rhythm: Normal rate and regular rhythm.     Heart sounds: Normal heart sounds. No murmur heard.   No friction rub. No gallop.  Pulmonary:     Effort: Pulmonary effort  is normal. No respiratory distress.     Breath sounds: Wheezing present. No rales.  Chest:     Chest wall: Tenderness: chest wall mass right chest, tenderness, no erythema or fluctuance.  Abdominal:     General: There is no distension.     Palpations: Abdomen is soft.     Tenderness: There is no abdominal tenderness. There is no guarding.  Musculoskeletal:     Cervical back: Normal range of motion.  Skin:    General: Skin is warm and dry.  Neurological:     Mental Status: He is alert and oriented to person, place, and time.    ED Results / Procedures / Treatments   Labs (all labs ordered are listed, but only abnormal results are displayed) Labs Reviewed  COMPREHENSIVE METABOLIC PANEL - Abnormal; Notable for the following components:      Result Value   Potassium 3.4 (*)    Glucose, Bld 161 (*)    BUN 6 (*)    Calcium 8.3 (*)    Total Protein 5.4 (*)    All other components within normal limits  RESP PANEL BY RT-PCR (FLU A&B, COVID) ARPGX2  CBC WITH DIFFERENTIAL/PLATELET  BRAIN NATRIURETIC PEPTIDE    EKG EKG Interpretation  Date/Time:  Wednesday November 18 2020 08:56:37 EDT Ventricular Rate:  111 PR Interval:  142 QRS Duration: 82 QT Interval:  314 QTC Calculation: 427 R Axis:   49 Text Interpretation: Sinus tachycardia Abnormal R-wave progression, early transition Nonspecific T abnormalities, lateral leads No significant change since last tracing Confirmed by [id number] (Alvira Monday) on 11/18/2020 10:25:29 AM  Radiology DG Chest Portable 1 View  Result Date: 11/18/2020 CLINICAL DATA:  Shortness of breath. EXAM: PORTABLE CHEST 1 VIEW COMPARISON:  10/12/2020. FINDINGS: Similar left hemithorax volume loss with elevation left hemidiaphragm, likely related to previous partial lung resection bilaterally. Streaky bibasilar opacities, similar to priors. No new consolidation. No visible pleural effusions or pneumothorax. No acute osseous abnormality. Similar cardiomediastinal  silhouette. No acute osseous abnormality. IMPRESSION: Streaky bibasilar opacities, likely atelectasis/scar given similar  appearance on priors. Electronically Signed   By: Feliberto Harts M.D.   On: 11/18/2020 09:50    Procedures Procedures   Medications Ordered in ED Medications  albuterol (PROVENTIL) (2.5 MG/3ML) 0.083% nebulizer solution 2.5 mg (2.5 mg Nebulization Given 11/18/20 0902)  ipratropium-albuterol (DUONEB) 0.5-2.5 (3) MG/3ML nebulizer solution 3 mL (3 mLs Nebulization Given 11/18/20 0902)  methylPREDNISolone sodium succinate (SOLU-MEDROL) 125 mg/2 mL injection 125 mg (125 mg Intravenous Given 11/18/20 1224)    ED Course  I have reviewed the triage vital signs and the nursing notes.  Pertinent labs & imaging results that were available during my care of the patient were reviewed by me and considered in my medical decision making (see chart for details).    MDM Rules/Calculators/A&P                            61yo male with history of asthma, COPD, possible sarcoidosis, CVA, DM, hypertension, who presents with concern for one week of worsening shortness of breath.  Differential diagnosis for dyspnea includes ACS, PE, COPD exacerbation, CHF exacerbation, anemia, pneumonia, viral etiology such as COVID 19 infection, metabolic abnormality.  Chest x-ray was done which showed no acute change in comparison to prior. EKG was evaluated by me which showed no acute changes.  BNP was9.8.  No CP, doubt ACS.  Low suspicion for PE, no hypoxia. Has URI symptoms and suspect this is etiology of COPD exacerbation. Given steroids in ED, rx for prednisone. He is requesting cough syrup, given short rx.  Breathing improved with nebulizers in the ED> Stable for follow up as an outpatient with PCP and Pulmonologist.  Also recommend follow up for chest wall mass. Patient discharged in stable condition with understanding of reasons to return.     Final Clinical Impression(s) / ED Diagnoses Final  diagnoses:  COPD exacerbation (HCC)    Rx / DC Orders ED Discharge Orders          Ordered    guaiFENesin-codeine (ROBITUSSIN AC) 100-10 MG/5ML syrup  3 times daily PRN        11/18/20 1046    predniSONE (DELTASONE) 10 MG tablet  Daily        11/18/20 1046             Alvira Monday, MD 11/18/20 2300

## 2020-11-27 ENCOUNTER — Other Ambulatory Visit: Payer: Self-pay | Admitting: Internal Medicine

## 2020-12-01 ENCOUNTER — Other Ambulatory Visit: Payer: Self-pay | Admitting: Internal Medicine

## 2020-12-01 DIAGNOSIS — N6312 Unspecified lump in the right breast, upper inner quadrant: Secondary | ICD-10-CM

## 2020-12-09 ENCOUNTER — Encounter: Payer: Self-pay | Admitting: Adult Health

## 2020-12-09 ENCOUNTER — Ambulatory Visit: Payer: Medicaid Other | Admitting: Adult Health

## 2020-12-09 VITALS — BP 145/93 | HR 92 | Ht 65.0 in | Wt 146.0 lb

## 2020-12-09 DIAGNOSIS — I639 Cerebral infarction, unspecified: Secondary | ICD-10-CM

## 2020-12-09 NOTE — Progress Notes (Signed)
[facility] [address] [place]. [place] [address] [phone]       STROKE FOLLOW UP NOTE  [name] Date of Birth:  26-Aug-1960 Medical Record Number:  [id number]   Reason for Referral: stroke follow up    SUBJECTIVE:   CHIEF COMPLAINT:  Chief Complaint  Patient presents with   Ischemic stroke    Rm 3, 4 month FU  "what was reason for my stroke, do I have any blockage, I haven't seen a heart dr to look into cause further"      HPI:   Update 12/09/2020 JM: Returns for 58-month stroke follow-up unaccompanied.  Overall stable from stroke standpoint without new stroke/TIA symptoms Reports residual right sided weakness - improvement since prior visit. Use of cane only as needed based on level of pain in lower back  Fiance manages pill in a pill box  Compliant on Plavix and atorvastatin -denies side effects Blood pressure today 145/93 - monitors at home and has been stable - elevated today - he believes due to increased back pain this AM Glucose levels good at home - no recent A1c level Routine lab work not recently completed   Reports PCP has been trying to manage anxiety/depression - he has since been referred to psychiatry - initial visit 11/11.   He asks about cause of stroke and concerned he has not been seen by cardiologist to further evaluate reason for stroke.  This was fully discussed at prior visit but again discussed today  No further concerns at this time    History provided for reference purposes only Initial visit 08/06/2020 JM: Mr. Koors is being seen for hospital stroke follow up unaccompanied.  He reports he has been doing well since discharge with residual right-sided weakness and imbalance with ongoing improvement.  He is currently ambulating with a cane and denies any recent falls.  He is currently working with PT for chronic low back pain but has not yet been seen by neuro rehab PT/OT for residual stroke deficits although he is  interested in seeing them once low back pain PT completed.  Has remained on Plavix and atorvastatin without associated side effects.  Blood pressure today 139/88.  Monitors glucose levels at home which have been stable.  No concerns at this time.  Stroke admission 06/23/2020 Mr. Jacky Dross is a 60 y.o. male with history of hypertension, type 2 diabetes mellitus, and tobacco use who presented to the ED on 06/23/2020 for evaluation of right arm and lower extremity weakness.  Personally reviewed hospitalization pertinent progress notes, lab work and imaging with summary provided.  Evaluated by Dr. Roda Shutters with stroke work-up revealing acute/subacute infarct within the posterior limb of left internal capsule likely secondary to small vessel disease.  HTN stable and resumed home meds.  LDL 135 and initiate atorvastatin 40 mg daily.  Controlled DM with A1c 6.8.  Recent tobacco use quitting 6 weeks prior with 17-pack-year history.  UDS positive for benzos.  Other stroke risk factors include history of stroke 2010.  Multiple other active problems including COPD, arthritis, asthma, chronic pain, anxiety and GERD.  PT/OT recommended CIR although patient declined and wished to go home with outpatient physical therapy.   Stroke: acute/subacute infarct within the posterior limb of the left internal capsule likely small vessel disease CT head: chronic small vessel disease.  MRI:  acute/subacute infarct within the posterior limb of the left internal capsule.       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 MRA Unremarkable, no LVO Carotid Doppler: 1-39% stenosis 2D Echo EF 55 to 60% LDL 135 HgbA1c 6.8 VTE prophylaxis - SCDs Diet: heart healthy/carb modified aspirin 81 mg daily prior to admission, now on aspirin 81 mg daily and clopidogrel 75 mg daily. DAPT with aspirin and plavix for 3 weeks then plavix alone. Therapy recommendations:  CIR --> OP PT Disposition:  home       ROS:    14 system review of systems performed and negative with exception of those listed in HPI  PMH:  Past Medical History:  Diagnosis Date   Arthritis    Asthma    Chronic pain 05/06/2020   COPD (chronic obstructive pulmonary disease) (HCC)    CVA (cerebral vascular accident) (HCC)    2010; 06/2020   Diabetes mellitus type 2 in nonobese [facility])    ED (erectile dysfunction)    Hypertension    Tobacco dependence 05/06/2020    PSH:  Past Surgical History:  Procedure Laterality Date   LUNG SURGERY     removed tissue    Social History:  Social History   Socioeconomic History   Marital status: Married    Spouse name: Not on file   Number of children: Not on file   Years of education: Not on file   Highest education level: Not on file  Occupational History   Occupation: disabled  Tobacco Use   Smoking status: Former    Packs/day: 0.50    Years: 34.00    Pack years: 17.00    Types: Cigarettes    Quit date: 05/11/2020    Years since quitting: 0.5   Smokeless tobacco: Never   Tobacco comments:    at the most smoked 6-7 cigarettes/day  Vaping Use   Vaping Use: Never used  Substance and Sexual Activity   Alcohol use: Never   Drug use: Never   Sexual activity: Not on file  Other Topics Concern   Not on file  Social History Narrative   Lives with wife   Social Determinants of Health   Financial Resource Strain: Not on file  Food Insecurity: Not on file  Transportation Needs: Not on file  Physical Activity: Not on file  Stress: Not on file  Social Connections: Not on file  Intimate Partner Violence: Not on file    Family History:  Family History  Problem Relation Age of Onset   Hypertension Mother    Diabetes Maternal Grandmother    Colon cancer Neg Hx    Esophageal cancer Neg Hx    Rectal cancer Neg Hx    Inflammatory bowel disease Neg Hx    Liver disease Neg Hx    Pancreatic cancer Neg Hx     Medications:   Current Outpatient Medications on File Prior to  Visit  Medication Sig Dispense Refill   albuterol (PROAIR HFA) 108 (90 Base) MCG/ACT inhaler 2 puffs every 4 hours as needed only  if your can't catch your breath 18 g 11   albuterol (PROVENTIL) (2.5 MG/3ML) 0.083% nebulizer solution PLEASE SEE ATTACHED FOR DETAILED DIRECTIONS (Patient taking differently: Take 2.5 mg by nebulization every 6 (six) hours as needed for wheezing or shortness of breath.) 150 mL 2   AMITIZA 24 MCG capsule Take 24 mcg by mouth at bedtime.     amLODipine (NORVASC) 10 MG tablet Take 10 mg by mouth daily.     atorvastatin (LIPITOR) 40 MG tablet Take 1 tablet (40 mg total) by mouth daily. 30 tablet  2   budesonide-formoterol (SYMBICORT) 160-4.5 MCG/ACT inhaler Inhale 2 puffs into the lungs 2 (two) times daily. 1 each 11   cetirizine (ZYRTEC ALLERGY) 10 MG tablet Take 1 tablet (10 mg total) by mouth at bedtime. 30 tablet 6   clopidogrel (PLAVIX) 75 MG tablet Take 1 tablet (75 mg total) by mouth daily. 90 tablet 3   escitalopram (LEXAPRO) 20 MG tablet SMARTSIG:1 Tablet(s) By Mouth Every Evening     ferrous sulfate 325 (65 FE) MG tablet Take 1 tablet (325 mg total) by mouth daily with breakfast. 30 tablet 0   gabapentin (NEURONTIN) 300 MG capsule Take 1 capsule (300 mg total) by mouth 3 (three) times daily. 50 capsule 0   guaiFENesin-codeine (ROBITUSSIN AC) 100-10 MG/5ML syrup Take 5 mLs by mouth 3 (three) times daily as needed for cough. 25 mL 0   hydrochlorothiazide (HYDRODIURIL) 12.5 MG tablet Take 12.5 mg by mouth daily.     hydrOXYzine (ATARAX/VISTARIL) 25 MG tablet Take 25 mg by mouth 2 (two) times daily as needed for anxiety.  unk   metaxalone (SKELAXIN) 800 MG tablet Take 1 tablet (800 mg total) by mouth 3 (three) times daily. (Patient taking differently: Take 800 mg by mouth 3 (three) times daily as needed for muscle spasms.) 21 tablet 0   metFORMIN (GLUCOPHAGE) 500 MG tablet Take 500 mg by mouth every morning.     montelukast (SINGULAIR) 10 MG tablet Take 1 tablet (10  mg total) by mouth at bedtime. 30 tablet 6   Multiple Vitamin (MULTIVITAMIN WITH MINERALS) TABS tablet Take 1 tablet by mouth daily. 30 tablet 0   nicotine (NICODERM CQ) 14 mg/24hr patch Place 1 patch (14 mg total) onto the skin daily. 28 patch 6   nitroGLYCERIN (NITROSTAT) 0.4 MG SL tablet Place 0.4 mg under the tongue every 5 (five) minutes as needed for chest pain.     Oxycodone HCl 20 MG TABS Take 20 mg by mouth every 6 (six) hours as needed for pain.     pantoprazole (PROTONIX) 40 MG tablet Take 1 tablet (40 mg total) by mouth daily. 30 tablet 0   sildenafil (VIAGRA) 100 MG tablet Take 1 tablet (100 mg total) by mouth daily as needed. 30 tablet 5   tamsulosin (FLOMAX) 0.4 MG CAPS capsule Take 0.4 mg by mouth every evening.     Tiotropium Bromide Monohydrate (SPIRIVA RESPIMAT) 2.5 MCG/ACT AERS 2 puffs each am 1 each 11   vitamin B-12 1000 MCG tablet Take 1 tablet (1,000 mcg total) by mouth daily. 30 tablet 0   Vitamin D, Ergocalciferol, (DRISDOL) 1.25 MG (50000 UNIT) CAPS capsule Take 50,000 Units by mouth once a week. Mondays     No current facility-administered medications on file prior to visit.    Allergies:   Allergies  Allergen Reactions   Insulins Swelling   Ibuprofen     Abdominal sensitivity   Shrimp [Shellfish Allergy] Itching   Tylenol [Acetaminophen]     Abdominal sensitvity   Lisinopril Rash    Tongue swelling      OBJECTIVE:  Physical Exam  Vitals:   12/09/20 0919  BP: (!) 145/93  Pulse: 92  Weight: 146 lb (66.2 kg)  Height: 5\' 5"  (1.651 m)    Body mass index is 24.3 kg/m. No results found.  General: well developed, well nourished, pleasant middle-aged African-American male, seated, in no evident distress Head: head normocephalic and atraumatic.   Neck: supple with no carotid or supraclavicular bruits Cardiovascular: regular rate and rhythm,  no murmurs Musculoskeletal: no deformity Skin:  no rash/petichiae Vascular:  Normal pulses all  extremities   Neurologic Exam Mental Status: Awake and fully alert.  Fluent speech and language.  Oriented to place and time. Recent and remote memory intact. Attention span, concentration and fund of knowledge appropriate. Mood and affect appropriate.  Cranial Nerves: Pupils equal, briskly reactive to light. Extraocular movements full without nystagmus. Visual fields full to confrontation. Hearing intact. Facial sensation intact. Face, tongue, palate moves normally and symmetrically.  Motor: Normal bulk and tone. Normal strength in all tested extremity muscles except very slight decrease right hand dexterity and very slight right hip flexor weakness Sensory.: intact to touch , pinprick , position and vibratory sensation.  Coordination: Rapid alternating movements normal in all extremities except slightly decreased right hand. Finger-to-nose and heel-to-shin performed accurately bilaterally.   Gait and Station: Arises from chair without difficulty. Stance is normal. Gait demonstrates normal stride length with mild favoring of right leg without use of AD today Reflexes: 1+ and symmetric. Toes downgoing.        ASSESSMENT: Wilkie Packwood is a 60 y.o. year old male with recent acute/subacute infarct in posterior limb of the left internal capsule on 06/23/2020 likely secondary to small vessel disease given location and risk factors.  Vascular risk factors include HTN, HLD, DM, recent tobacco use and prior stroke history in 2010.     PLAN:  L PLIC stroke :  Residual deficit: Mild right hemiparesis and gait impairment. Making great recovery since prior visit. Completed PT for low back pain - would like to hold off on any additional therapy at this time Continue clopidogrel 75 mg daily  and atorvastatin 40 mg daily for secondary stroke prevention.   Discussed secondary stroke prevention measures and importance of close PCP follow up for aggressive stroke risk factor management  HTN: BP goal <130/90.   Stable on current regimen per PCP HLD: LDL goal <70. Continue atorvastatin 40 mg daily. Has f/u in December with PCP for repeat labs DMII: A1c goal<7.0. Recent A1c 6.8 on glipizide and metformin. Has f/u in December with PCP for repeat labs Tobacco use: Endorses continued cessation    Follow up in 6 months or call earlier if needed - if stable at that time, can follow up as needed   CC:  PCP: Fleet Contras, MD    I spent 34 minutes of face-to-face and non-face-to-face time with patient.  This included previsit chart review including recent hospitalization, lab review, study review, electronic health record documentation, patient education regarding prior stroke including etiology as well as secondary stroke prevention measures and aggressive stroke risk factor management, residual deficits and possible further recovery and answered all questions to patient satisfaction   Ihor Austin, AGNP-BC  [facility] [address] [place], [place] [address]  Phone [phone] Fax [phone] Note: This document was prepared with digital dictation and possible smart phrase technology. Any transcriptional errors that result from this process are unintentional.

## 2020-12-09 NOTE — Patient Instructions (Signed)
Continue clopidogrel 75 mg daily  and atorvastatin 40mg  daily   for secondary stroke prevention  Continue to follow up with PCP regarding cholesterol, blood pressure and diabetes management  Maintain strict control of hypertension with blood pressure goal below 130/90, diabetes with hemoglobin A1c goal below 7.0% and cholesterol with LDL cholesterol (bad cholesterol) goal below 70 mg/dL.       Followup in the future with me in 6 months or call earlier if needed       Thank you for coming to see at [facility]. I hope we have been able to provide you high quality care today.  You may receive a patient satisfaction survey over the next few weeks. We would appreciate your feedback and comments so that we may continue to improve ourselves and the health of our patients.

## 2020-12-13 ENCOUNTER — Emergency Department (HOSPITAL_BASED_OUTPATIENT_CLINIC_OR_DEPARTMENT_OTHER)
Admission: EM | Admit: 2020-12-13 | Discharge: 2020-12-13 | Disposition: A | Discharge status: Home / Self Care | Payer: Medicaid Other | Attending: Emergency Medicine | Admitting: Emergency Medicine

## 2020-12-13 ENCOUNTER — Other Ambulatory Visit: Payer: Self-pay

## 2020-12-13 ENCOUNTER — Encounter (HOSPITAL_BASED_OUTPATIENT_CLINIC_OR_DEPARTMENT_OTHER): Payer: Self-pay

## 2020-12-13 DIAGNOSIS — J45909 Unspecified asthma, uncomplicated: Secondary | ICD-10-CM | POA: Insufficient documentation

## 2020-12-13 DIAGNOSIS — E119 Type 2 diabetes mellitus without complications: Secondary | ICD-10-CM | POA: Insufficient documentation

## 2020-12-13 DIAGNOSIS — Z79899 Other long term (current) drug therapy: Secondary | ICD-10-CM | POA: Insufficient documentation

## 2020-12-13 DIAGNOSIS — Z87891 Personal history of nicotine dependence: Secondary | ICD-10-CM | POA: Insufficient documentation

## 2020-12-13 DIAGNOSIS — I10 Essential (primary) hypertension: Secondary | ICD-10-CM | POA: Insufficient documentation

## 2020-12-13 DIAGNOSIS — Z7951 Long term (current) use of inhaled steroids: Secondary | ICD-10-CM | POA: Insufficient documentation

## 2020-12-13 DIAGNOSIS — N6310 Unspecified lump in the right breast, unspecified quadrant: Secondary | ICD-10-CM | POA: Insufficient documentation

## 2020-12-13 DIAGNOSIS — J449 Chronic obstructive pulmonary disease, unspecified: Secondary | ICD-10-CM | POA: Insufficient documentation

## 2020-12-13 DIAGNOSIS — Z7984 Long term (current) use of oral hypoglycemic drugs: Secondary | ICD-10-CM | POA: Insufficient documentation

## 2020-12-13 NOTE — ED Triage Notes (Signed)
He c/o area of swelling on right chest x ~ 1 month. It is near the area at right breast where he had had a "tube in my chest after a lung surgery in 2011 in [place]". He is in no distress.

## 2020-12-13 NOTE — ED Notes (Signed)
Dc instructions reviewed with patient. Patient voiced understanding. Dc with belongings.  °

## 2020-12-13 NOTE — ED Provider Notes (Addendum)
[facility] EMERGENCY DEPT Provider Note   CSN: [id number] Arrival date & time: 12/13/20  1638     History Chief Complaint  Patient presents with   chest swelling    Glenn [name] is a 60 y.o. male.  HPI  Patient presents to the ED for evaluation of swelling in the right breast area.  Patient states its been going on for a month or 2.  The area has been gradually getting larger in size.  It is uncomfortable when he lies down on it.  He is not having any fevers or chills.  He has not noticed any drainage.  He is not having any chest pain or shortness of breath.  Patient did see his doctor and was scheduled for imaging tests on December 1 at the breast center.  Patient states however it has been increasing in size since then so he came to the ED today.  Past Medical History:  Diagnosis Date   Arthritis    Asthma    Chronic pain 05/06/2020   COPD (chronic obstructive pulmonary disease) (HCC)    CVA (cerebral vascular accident) (HCC)    2010; 06/2020   Diabetes mellitus type 2 in nonobese [facility])    ED (erectile dysfunction)    Hypertension    Tobacco dependence 05/06/2020    Patient Active Problem List   Diagnosis Date Noted   Acute CVA (cerebrovascular accident) (HCC) 06/23/2020   Daytime sleepiness 06/09/2020   Malnutrition of moderate degree 05/07/2020   Chronic pain 05/06/2020   Tobacco dependence 05/06/2020   Hypertension    Diabetes mellitus type 2 in nonobese [facility])    COPD (chronic obstructive pulmonary disease) (HCC) 04/11/2020   Elevated troponin 04/11/2020   Acute on chronic respiratory failure with hypoxia (HCC) 04/11/2020   Elevated transaminase level    COPD with acute exacerbation (HCC) 06/30/2019   Acute respiratory failure with hypoxemia (HCC) 06/30/2019   Hypotension 06/30/2019   Near syncope 06/30/2019   Pleural nodule 06/30/2019   Colon cancer screening 12/18/2017   History of colonic polyps 12/18/2017   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    COPD GOLD  II 10/05/2016    Past Surgical History:  Procedure Laterality Date   LUNG SURGERY     removed tissue       Family History  Problem Relation Age of Onset   Hypertension Mother    Diabetes Maternal Grandmother    Colon cancer Neg Hx    Esophageal cancer Neg Hx    Rectal cancer Neg Hx    Inflammatory bowel disease Neg Hx    Liver disease Neg Hx    Pancreatic cancer Neg Hx     Social History   Tobacco Use   Smoking status: Former    Packs/day: 0.50    Years: 34.00    Pack years: 17.00    Types: Cigarettes    Quit date: 05/11/2020    Years since quitting: 0.5   Smokeless tobacco: Never   Tobacco comments:    at the most smoked 6-7 cigarettes/day  Vaping Use   Vaping Use: Never used  Substance Use Topics   Alcohol use: Never   Drug use: Never    Home Medications Prior to Admission medications   Medication Sig Start Date End Date Taking? Authorizing Provider  albuterol (PROAIR HFA) 108 (90 Base) MCG/ACT inhaler 2 puffs every 4 hours as needed only  if your can't catch your breath 11/11/20   [name], MD  albuterol (PROVENTIL) (2.5 MG/3ML)  0.083% nebulizer solution PLEASE SEE ATTACHED FOR DETAILED DIRECTIONS Patient taking differently: Take 2.5 mg by nebulization every 6 (six) hours as needed for wheezing or shortness of breath. 02/26/18   [name], MD  AMITIZA 24 MCG capsule Take 24 mcg by mouth at bedtime. 03/31/20   [provider]  amLODipine (NORVASC) 10 MG tablet Take 10 mg by mouth daily.    [provider]  atorvastatin (LIPITOR) 40 MG tablet Take 1 tablet (40 mg total) by mouth daily. 06/26/20   Pokhrel, Rebekah Chesterfield, MD  budesonide-formoterol (SYMBICORT) 160-4.5 MCG/ACT inhaler Inhale 2 puffs into the lungs 2 (two) times daily. 06/09/20   Parrett, Virgel Bouquet, NP  cetirizine (ZYRTEC ALLERGY) 10 MG tablet Take 1 tablet (10 mg total) by mouth at bedtime. 06/09/20   Parrett, Virgel Bouquet, NP  clopidogrel (PLAVIX) 75 MG tablet Take 1 tablet (75 mg  total) by mouth daily. 06/26/20 06/26/21  Pokhrel, Rebekah Chesterfield, MD  escitalopram (LEXAPRO) 20 MG tablet SMARTSIG:1 Tablet(s) By Mouth Every Evening 11/02/20   [provider]  ferrous sulfate 325 (65 FE) MG tablet Take 1 tablet (325 mg total) by mouth daily with breakfast. 05/10/20 12/09/20  Briant Cedar, MD  gabapentin (NEURONTIN) 300 MG capsule Take 1 capsule (300 mg total) by mouth 3 (three) times daily. 02/09/18   Eustace Moore, MD  guaiFENesin-codeine [facility]) 100-10 MG/5ML syrup Take 5 mLs by mouth 3 (three) times daily as needed for cough. 11/18/20   Alvira Monday, MD  hydrochlorothiazide (HYDRODIURIL) 12.5 MG tablet Take 12.5 mg by mouth daily. 03/26/20   [provider]  hydrOXYzine (ATARAX/VISTARIL) 25 MG tablet Take 25 mg by mouth 2 (two) times daily as needed for anxiety. 04/14/20   [provider]  metaxalone (SKELAXIN) 800 MG tablet Take 1 tablet (800 mg total) by mouth 3 (three) times daily. Patient taking differently: Take 800 mg by mouth 3 (three) times daily as needed for muscle spasms. 01/30/20   Rhys Martini, PA-C  metFORMIN (GLUCOPHAGE) 500 MG tablet Take 500 mg by mouth every morning. 04/17/20   [provider]  montelukast (SINGULAIR) 10 MG tablet Take 1 tablet (10 mg total) by mouth at bedtime. 06/09/20   Parrett, Virgel Bouquet, NP  Multiple Vitamin (MULTIVITAMIN WITH MINERALS) TABS tablet Take 1 tablet by mouth daily. 05/10/20   Briant Cedar, MD  nicotine (NICODERM CQ) 14 mg/24hr patch Place 1 patch (14 mg total) onto the skin daily. 06/09/20   Parrett, Virgel Bouquet, NP  nitroGLYCERIN (NITROSTAT) 0.4 MG SL tablet Place 0.4 mg under the tongue every 5 (five) minutes as needed for chest pain.    [provider]  Oxycodone HCl 20 MG TABS Take 20 mg by mouth every 6 (six) hours as needed for pain. 03/31/20   [provider]  pantoprazole (PROTONIX) 40 MG tablet Take 1 tablet (40 mg total) by mouth daily. 07/02/19   Elgergawy,  Leana Roe, MD  sildenafil (VIAGRA) 100 MG tablet Take 1 tablet (100 mg total) by mouth daily as needed. 11/17/20     tamsulosin (FLOMAX) 0.4 MG CAPS capsule Take 0.4 mg by mouth every evening. 06/26/19   [provider]  Tiotropium Bromide Monohydrate (SPIRIVA RESPIMAT) 2.5 MCG/ACT AERS 2 puffs each am 01/15/20   [name], MD  vitamin B-12 1000 MCG tablet Take 1 tablet (1,000 mcg total) by mouth daily. 05/10/20   Briant Cedar, MD  Vitamin D, Ergocalciferol, (DRISDOL) 1.25 MG (50000 UNIT) CAPS capsule Take  50,000 Units by mouth once a week. Mondays 05/02/19   [provider]    Allergies    Insulins, Ibuprofen, Shrimp [shellfish allergy], Tylenol [acetaminophen], and Lisinopril  Review of Systems   Review of Systems  All other systems reviewed and are negative.  Physical Exam Updated Vital Signs BP (!) 143/96 (BP Location: Right Arm)   Pulse (!) 112   Temp 98.5 F (36.9 C) (Oral)   Resp 20   Ht 1.651 m (5\' 5" )   Wt 66.2 kg   SpO2 97%   BMI 24.30 kg/m   Physical Exam Vitals and nursing note reviewed.  Constitutional:      General: He is not in acute distress.    Appearance: He is well-developed.  HENT:     Head: Normocephalic and atraumatic.     Right Ear: External ear normal.     Left Ear: External ear normal.  Eyes:     General: No scleral icterus.       Right eye: No discharge.        Left eye: No discharge.     Conjunctiva/sclera: Conjunctivae normal.  Neck:     Trachea: No tracheal deviation.  Cardiovascular:     Rate and Rhythm: Normal rate and regular rhythm.  Pulmonary:     Effort: Pulmonary effort is normal. No respiratory distress.     Breath sounds: Normal breath sounds. No stridor.  Chest:  Breasts:    Right: Mass and tenderness present. No nipple discharge or skin change.     Comments: Mass noted around the nipple in the soft tissue of the right breast, no fluctuance or induration Abdominal:     General: There is no  distension.  Musculoskeletal:        General: No swelling or deformity.     Cervical back: Neck supple.  Skin:    General: Skin is warm and dry.     Findings: No rash.  Neurological:     Mental Status: He is alert.     Cranial Nerves: Cranial nerve deficit: no gross deficits.    ED Results / Procedures / Treatments   Labs (all labs ordered are listed, but only abnormal results are displayed) Labs Reviewed - No data to display  EKG None  Radiology No results found.  Procedures Procedures   Medications Ordered in ED Medications - No data to display  ED Course  I have reviewed the triage vital signs and the nursing notes.  Pertinent labs & imaging results that were available during my care of the patient were reviewed by me and considered in my medical decision making (see chart for details).    MDM Rules/Calculators/A&P                           Palpable soft tissue mass in the right breast.  Patient is scheduled for a mammogram and ultrasound.  This is scheduled at the breast center.  No signs of infection.  Patient certainly requires further diagnostic imaging but this is best done at the breast center.  Explained to him that unfortunately cannot have that same test done here in the emergency department.  I do recommend that he contact the breast center and his doctor on Monday to see if there is any chance that appointment can be made sooner. Final Clinical Impression(s) / ED Diagnoses Final diagnoses:  Mass of right breast, unspecified quadrant    Rx / DC Orders ED  Discharge Orders     None        Linwood Dibbles, MD 12/13/20 [id number]    Linwood Dibbles, MD 12/13/20 [id number]

## 2020-12-13 NOTE — Discharge Instructions (Signed)
Call the breast center and/or your doctor's office on Monday to see if they can move up that appointment.

## 2020-12-17 ENCOUNTER — Other Ambulatory Visit (HOSPITAL_COMMUNITY): Payer: Self-pay

## 2020-12-17 ENCOUNTER — Ambulatory Visit
Admission: RE | Admit: 2020-12-17 | Discharge: 2020-12-17 | Disposition: A | Discharge status: Home / Self Care | Payer: Medicaid Other | Source: Ambulatory Visit | Attending: Internal Medicine | Admitting: Internal Medicine

## 2020-12-17 ENCOUNTER — Ambulatory Visit: Payer: Medicaid Other

## 2020-12-17 DIAGNOSIS — N6312 Unspecified lump in the right breast, upper inner quadrant: Secondary | ICD-10-CM

## 2021-01-07 ENCOUNTER — Other Ambulatory Visit: Payer: Medicaid Other

## 2021-01-14 ENCOUNTER — Other Ambulatory Visit (HOSPITAL_COMMUNITY): Payer: Self-pay

## 2021-02-22 ENCOUNTER — Other Ambulatory Visit (HOSPITAL_COMMUNITY): Payer: Self-pay

## 2021-03-01 ENCOUNTER — Other Ambulatory Visit: Payer: Self-pay | Admitting: Surgery

## 2021-03-09 ENCOUNTER — Telehealth: Payer: Self-pay | Admitting: *Deleted

## 2021-03-09 NOTE — Telephone Encounter (Signed)
As stable from stroke standpoint and prior stroke in May 2022, can proceed with surgical procedure with holding Plavix 3-5 days prior with small but acceptable risk of recurrent stroke while off therapy.  Recommend restarting immediately after or once hemodynamically stable. He is not (or should not be)  taking aspirin.  Please let me know if you have any other questions.

## 2021-03-09 NOTE — Telephone Encounter (Signed)
Letter of surgical clearance faxed to [facility], received confirmation.

## 2021-03-09 NOTE — Telephone Encounter (Signed)
Received surgical clearance letter from [facility] re: right breast mastectomy, no surgery date scheduled.  Patient is taking plavix. Form on NP's desk for review, completion.

## 2021-03-16 ENCOUNTER — Other Ambulatory Visit (HOSPITAL_COMMUNITY): Payer: Self-pay

## 2021-03-31 ENCOUNTER — Other Ambulatory Visit (HOSPITAL_COMMUNITY): Payer: Self-pay

## 2021-04-19 NOTE — Pre-Procedure Instructions (Signed)
Surgical Instructions    Your procedure is scheduled on Thursday 04/29/21.   Report to [facility] Main Entrance "A" at 07:00 A.M., then check in with the Admitting office.  Call this number if you have problems the morning of surgery:  [phone]   If you have any questions prior to your surgery date call [phone]: Open Monday-Friday 8am-4pm    Remember:  Do not eat after midnight the night before your surgery  You may drink clear liquids until 06:00 A.M. the morning of your surgery.   Clear liquids allowed are: Water, Non-Citrus Juices (without pulp), Carbonated Beverages, Clear Tea, Black Coffee ONLY (NO MILK, CREAM OR POWDERED CREAMER of any kind), and Gatorade  Patient Instructions  The night before surgery:  No food after midnight. ONLY clear liquids after midnight  The day of surgery (if you have diabetes): Drink ONE (1) 12 oz G2 given to you in your pre admission testing appointment by 06:00 A.M. the morning of surgery. Drink in one sitting. Do not sip.  This drink was given to you during your hospital  pre-op appointment visit.  Nothing else to drink after completing the  12 oz bottle of G2.         If you have questions, please contact your surgeons office.     Take these medicines the morning of surgery with A SIP OF WATER:   albuterol (PROAIR HFA) 108 (90 Base)  amLODipine (NORVASC)  Oxycodone   pantoprazole (PROTONIX)   Tiotropium Bromide Monohydrate (SPIRIVA RESPIMAT)   XTAMPZA    Take these medicines if needed:   albuterol (PROVENTIL)   cetirizine (ZYRTEC ALLERGY)   escitalopram (LEXAPRO)  gabapentin (NEURONTIN)  montelukast (SINGULAIR)  naphazoline-pheniramine (VISINE)  nitroGLYCERIN (NITROSTAT)  Please follow your surgeon's instructions regarding clopidogrel (PLAVIX) . If you have not received instructions then please contact your surgeon's office for instructions.    As of today, STOP taking any Aspirin (unless otherwise instructed by your  surgeon) diclofenac Sodium (VOLTAREN), Aleve, Naproxen, Ibuprofen, Motrin, Advil, Goody's, BC's, all herbal medications, fish oil, and all vitamins.  WHAT DO I DO ABOUT MY DIABETES MEDICATION?   Do not take oral diabetes medicines (pills) the morning of surgery.  DO NOT TAKE metFORMIN (GLUCOPHAGE) the morning of surgery.   The day of surgery, do not take other diabetes injectables, including Byetta (exenatide), Bydureon (exenatide ER), Victoza (liraglutide), or Trulicity (dulaglutide).  HOW TO MANAGE YOUR DIABETES BEFORE AND AFTER SURGERY  Why is it important to control my blood sugar before and after surgery? Improving blood sugar levels before and after surgery helps healing and can limit problems. A way of improving blood sugar control is eating a healthy diet by:  Eating less sugar and carbohydrates  Increasing activity/exercise  Talking with your doctor about reaching your blood sugar goals High blood sugars (greater than 180 mg/dL) can raise your risk of infections and slow your recovery, so you will need to focus on controlling your diabetes during the weeks before surgery. Make sure that the doctor who takes care of your diabetes knows about your planned surgery including the date and location.  How do I manage my blood sugar before surgery? Check your blood sugar at least 4 times a day, starting 2 days before surgery, to make sure that the level is not too high or low.  Check your blood sugar the morning of your surgery when you wake up and every 2 hours until you get to the Short Stay unit.  If  your blood sugar is less than 70 mg/dL, you will need to treat for low blood sugar: Do not take insulin. Treat a low blood sugar (less than 70 mg/dL) with  cup of clear juice (cranberry or apple), 4 glucose tablets, OR glucose gel. Recheck blood sugar in 15 minutes after treatment (to make sure it is greater than 70 mg/dL). If your blood sugar is not greater than 70 mg/dL on recheck,  call [phone] for further instructions. Report your blood sugar to the short stay nurse when you get to Short Stay.  If you are admitted to the hospital after surgery: Your blood sugar will be checked by the staff and you will probably be given insulin after surgery (instead of oral diabetes medicines) to make sure you have good blood sugar levels. The goal for blood sugar control after surgery is 80-180 mg/dL.            Do not wear jewelry or makeup Do not wear lotions, powders, perfumes/colognes, or deodorant. Do not shave 48 hours prior to surgery.  Men may shave face and neck. Do not bring valuables to the hospital. Do not wear nail polish, gel polish, artificial nails, or any other type of covering on natural nails (fingers and toes) If you have artificial nails or gel coating that need to be removed by a nail salon, please have this removed prior to surgery. Artificial nails or gel coating may interfere with anesthesia's ability to adequately monitor your vital signs.  [facility] is not responsible for any belongings or valuables. .   Do NOT Smoke (Tobacco/Vaping)  24 hours prior to your procedure  If you use a CPAP at night, you may bring your mask for your overnight stay.   Contacts, glasses, hearing aids, dentures or partials may not be worn into surgery, please bring cases for these belongings   For patients admitted to the hospital, discharge time will be determined by your treatment team.   Patients discharged the day of surgery will not be allowed to drive home, and someone needs to stay with them for 24 hours.  NO VISITORS WILL BE ALLOWED IN PRE-OP WHERE PATIENTS ARE PREPPED FOR SURGERY.  ONLY 1 SUPPORT PERSON MAY BE PRESENT IN THE WAITING ROOM WHILE YOU ARE IN SURGERY.  IF YOU ARE TO BE ADMITTED, ONCE YOU ARE IN YOUR ROOM YOU WILL BE ALLOWED TWO (2) VISITORS. 1 (ONE) VISITOR MAY STAY OVERNIGHT BUT MUST ARRIVE TO THE ROOM BY 8pm.  Minor children may have two parents  present. Special consideration for safety and communication needs will be reviewed on a case by case basis.  Special instructions:    Oral Hygiene is also important to reduce your risk of infection.  Remember - BRUSH YOUR TEETH THE MORNING OF SURGERY WITH YOUR REGULAR TOOTHPASTE   [facility]- Preparing For Surgery  Before surgery, you can play an important role. Because skin is not sterile, your skin needs to be as free of germs as possible. You can reduce the number of germs on your skin by washing with CHG (chlorahexidine gluconate) Soap before surgery.  CHG is an antiseptic cleaner which kills germs and bonds with the skin to continue killing germs even after washing.     Please do not use if you have an allergy to CHG or antibacterial soaps. If your skin becomes reddened/irritated stop using the CHG.  Do not shave (including legs and underarms) for at least 48 hours prior to first CHG shower. It  is OK to shave your face.  Please follow these instructions carefully.     Shower the NIGHT BEFORE SURGERY and the MORNING OF SURGERY with CHG Soap.   If you chose to wash your hair, wash your hair first as usual with your normal shampoo. After you shampoo, rinse your hair and body thoroughly to remove the shampoo.  Then [employer] and genitals (private parts) with your normal soap and rinse thoroughly to remove soap.  After that Use CHG Soap as you would any other liquid soap. You can apply CHG directly to the skin and wash gently with a scrungie or a clean washcloth.   Apply the CHG Soap to your body ONLY FROM THE NECK DOWN.  Do not use on open wounds or open sores. Avoid contact with your eyes, ears, mouth and genitals (private parts). Wash Face and genitals (private parts)  with your normal soap.   Wash thoroughly, paying special attention to the area where your surgery will be performed.  Thoroughly rinse your body with warm water from the neck down.  DO NOT shower/wash with your normal  soap after using and rinsing off the CHG Soap.  Pat yourself dry with a CLEAN TOWEL.  Wear CLEAN PAJAMAS to bed the night before surgery  Place CLEAN SHEETS on your bed the night before your surgery  DO NOT SLEEP WITH PETS.   Day of Surgery:  Take a shower with CHG soap. Wear Clean/Comfortable clothing the morning of surgery Do not apply any deodorants/lotions.   Remember to brush your teeth WITH YOUR REGULAR TOOTHPASTE.    COVID testing  If you are going to stay overnight or be admitted after your procedure/surgery and require a pre-op COVID test, please follow these instructions after your COVID test   You are not required to quarantine however you are required to wear a well-fitting mask when you are out and around people not in your household.  If your mask becomes wet or soiled, replace with a new one.  Wash your hands often with soap and water for 20 seconds or clean your hands with an alcohol-based hand sanitizer that contains at least 60% alcohol.  Do not share personal items.  Notify your provider: if you are in close contact with someone who has COVID  or if you develop a fever of 100.4 or greater, sneezing, cough, sore throat, shortness of breath or body aches.    Please read over the following fact sheets that you were given.

## 2021-04-20 ENCOUNTER — Ambulatory Visit (HOSPITAL_COMMUNITY): Payer: Medicaid Other | Admitting: Emergency Medicine

## 2021-04-20 ENCOUNTER — Other Ambulatory Visit: Payer: Self-pay

## 2021-04-20 ENCOUNTER — Encounter (HOSPITAL_COMMUNITY): Payer: Self-pay

## 2021-04-20 ENCOUNTER — Encounter (HOSPITAL_COMMUNITY)
Admission: RE | Admit: 2021-04-20 | Discharge: 2021-04-20 | Disposition: A | Discharge status: Home / Self Care | Payer: Medicaid Other | Source: Ambulatory Visit | Attending: Surgery | Admitting: Surgery

## 2021-04-20 VITALS — BP 142/94 | HR 108 | Temp 98.1°F | Resp 18 | Ht 65.0 in | Wt 148.0 lb

## 2021-04-20 DIAGNOSIS — Z01812 Encounter for preprocedural laboratory examination: Secondary | ICD-10-CM | POA: Diagnosis present

## 2021-04-20 DIAGNOSIS — Z01818 Encounter for other preprocedural examination: Secondary | ICD-10-CM

## 2021-04-20 DIAGNOSIS — I10 Essential (primary) hypertension: Secondary | ICD-10-CM | POA: Insufficient documentation

## 2021-04-20 DIAGNOSIS — E119 Type 2 diabetes mellitus without complications: Secondary | ICD-10-CM | POA: Diagnosis not present

## 2021-04-20 HISTORY — DX: Depression, unspecified: F32.A

## 2021-04-20 HISTORY — DX: Dyspnea, unspecified: R06.00

## 2021-04-20 HISTORY — DX: Anxiety disorder, unspecified: F41.9

## 2021-04-20 LAB — BASIC METABOLIC PANEL
Anion gap: 5 (ref 5–15)
BUN: 16 mg/dL (ref 8–23)
CO2: 33 mmol/L — ABNORMAL HIGH (ref 22–32)
Calcium: 9.1 mg/dL (ref 8.9–10.3)
Chloride: 100 mmol/L (ref 98–111)
Creatinine, Ser: 1 mg/dL (ref 0.61–1.24)
GFR, Estimated: >60 mL/min (ref >60)
Glucose, Bld: 252 mg/dL — ABNORMAL HIGH (ref 70–99)
Potassium: 4.2 mmol/L (ref 3.5–5.1)
Sodium: 138 mmol/L (ref 135–145)

## 2021-04-20 LAB — CBC
HCT: 47.1 % (ref 39.0–52.0)
Hemoglobin: 14.8 g/dL (ref 13.0–17.0)
MCH: 28.1 pg (ref 26.0–34.0)
MCHC: 31.4 g/dL (ref 30.0–36.0)
MCV: 89.5 fL (ref 80.0–100.0)
Platelets: 279 10*3/uL (ref 150–400)
RBC: 5.26 MIL/uL (ref 4.22–5.81)
RDW: 13.2 % (ref 11.5–15.5)
WBC: 8.1 10*3/uL (ref 4.0–10.5)
nRBC: 0 % (ref 0.0–0.2)

## 2021-04-20 LAB — GLUCOSE, CAPILLARY: Glucose-Capillary: 292 mg/dL — ABNORMAL HIGH (ref 70–99)

## 2021-04-20 NOTE — Progress Notes (Signed)
PCP - Dr. Fleet Contras ?Cardiologist - denies ? ?PPM/ICD - n/a ?Device Orders - n/a ?Rep Notified - n/a ? ?Chest x-ray - 11/18/20 ?EKG - 11/20/20 ?Stress Test - patient denies ?ECHO - 06/24/20 ?Cardiac Cath - patient denies ? ?Sleep Study - patient denies ?CPAP - n/a ? ?Type 2 DM ?CBG today- 292. Patient states he ate a fish sandwich and fries for lunch and did not take his Metformin this morning.  ? ?Patient states he checks his blood sugar once a day and it ranges 130-140. ? ? ? ?Blood Thinner Instructions: Please contact your surgeon's office for instructions regarding Plavix and Aspirin. If you have not received instructions then please contact your surgeon's office for instructions.  ?Patient states he has not received instructions and will contact Dr. Eliberto Ivory office for instructions.  ?Informed patient that there is a surgical clearance note in his chart stating to hold Plavix 3-5 days prior to surgery. Patient verbalized understanding but will still contact Dr. Eliberto Ivory office.  ? ?Aspirin Instructions: As of today stop taking Aspirin unless otherwise instructed by your surgeon. Patient will also confirm Aspirin instructions when he contact's Dr. Eliberto Ivory office.  ? ?ERAS Protcol - Yes ?PRE-SURGERY Ensure or G2- G2 ? ?COVID TEST- N/A ? ? ?Anesthesia review: Yes. Spoke with Joslyn Hy, PA regarding patient's blood sugar today. Per Joslyn Hy, PA patient instructed to check his blood sugar regularly and take his Metformin as prescribed.  ?Neurologic clearance note 03/09/21. ? ?Patient denies shortness of breath, fever, cough and chest pain at PAT appointment ? ? ?All instructions explained to the patient, with a verbal understanding of the material. Patient agrees to go over the instructions while at home for a better understanding. Patient also instructed to self quarantine after being tested for COVID-19. The opportunity to ask questions was provided. ? ? ?

## 2021-04-21 LAB — HEMOGLOBIN A1C
Hgb A1c MFr Bld: 7.6 % — ABNORMAL HIGH (ref 4.8–5.6)
Mean Plasma Glucose: 171 mg/dL

## 2021-04-21 NOTE — Progress Notes (Signed)
Anesthesia Chart Review: ? ? Case: [id number] Date/Time: 04/29/21 0845  ? Procedure: RIGHT TOTAL MASTECTOMY (Right: Breast)  ? Anesthesia type: General  ? Pre-op diagnosis: RIGHT GYNECOMASTIA  ? Location: [facility] OR ROOM 09 / [place] OR  ? Surgeons: Coralie Keens, MD  ? ?  ? ? ?DISCUSSION: ?Pt is 61 years old with hx HTN, DM, asthma, COPD, CVA (2010, 2022) ? ?Pt to stop plavix 3-5 days before surgery ? ? ?VS: BP (!) 142/94   Pulse (!) 108   Temp 36.7 ?C (Oral)   Resp 18   Ht 5\' 5"  (1.651 m)   Wt 67.1 kg   SpO2 97%   BMI 24.63 kg/m?  ? ?PROVIDERS: ?- PCP is Nolene Ebbs, MD ?- Neurology care by Frann Rider, NP. Last office visit 12/09/20. Ok to hold plavix for surgery per Ms. McCue in note 03/09/21 ? ? ?LABS: Labs reviewed: Acceptable for surgery. Dr. Ninfa Linden has reviewed lab results.  ? ?(all labs ordered are listed, but only abnormal results are displayed) ? ?Labs Reviewed  ?GLUCOSE, CAPILLARY - Abnormal; Notable for the following components:  ?    Result Value  ? Glucose-Capillary 292 (*)   ? All other components within normal limits  ?BASIC METABOLIC PANEL - Abnormal; Notable for the following components:  ? CO2 33 (*)   ? Glucose, Bld 252 (*)   ? All other components within normal limits  ?HEMOGLOBIN A1C - Abnormal; Notable for the following components:  ? Hgb A1c MFr Bld 7.6 (*)   ? All other components within normal limits  ?CBC  ? ? ? ?IMAGES: ?1 view CXR 11/18/20:  ?- Streaky bibasilar opacities, likely atelectasis/scar given similar appearance on priors ? ?EKG 11/18/20: Sinus tachycardia. Abnormal R-wave progression, early transition. Nonspecific T abnormalities, lateral leads ? ? ?CV: ?Echo 06/24/20:  ?1. Left ventricular ejection fraction, by estimation, is 55 to 60%. The left ventricle has normal function. The left ventricle has no regional wall motion abnormalities. There is mild concentric left ventricular hypertrophy. Left ventricular diastolic parameters are consistent with Grade I diastolic  dysfunction (impaired relaxation).  ? 2. Right ventricular systolic function is normal. The right ventricular size is normal.  ? 3. The mitral valve is normal in structure. No evidence of mitral valve regurgitation. No evidence of mitral stenosis.  ? 4. The aortic valve is normal in structure. Aortic valve regurgitation is not visualized. No aortic stenosis is present.  ? 5. The inferior vena cava is normal in size with greater than 50% respiratory variability, suggesting right atrial pressure of 3 mmHg.  ? 6. Agitated saline contrast bubble study was negative, with no evidence of any interatrial shunt.  ?- Comparison(s): A prior study was performed on 04/13/20. No significant change from prior study. Prior images reviewed side by side. ? ?Carotid duplex 06/23/20:  ?- Right Carotid: Velocities in the right ICA are consistent with a 1-39% stenosis.  ?- Left Carotid: Velocities in the left ICA are consistent with a 1-39% stenosis.  ?- Vertebrals:  Bilateral vertebral arteries demonstrate antegrade flow.  ?- Subclavians: Normal flow hemodynamics were seen in bilateral subclavian arteries.  ? ? ?Past Medical History:  ?Diagnosis Date  ? Anxiety   ? Arthritis   ? Asthma   ? Chronic pain 05/06/2020  ? COPD (chronic obstructive pulmonary disease) ([place])   ? CVA (cerebral vascular accident) [facility])   ? 2010; 06/2020  ? Depression   ? Diabetes mellitus type 2 in nonobese [facility])   ? Dyspnea   ?  ED (erectile dysfunction)   ? Hypertension   ? Tobacco dependence 05/06/2020  ? ? ?Past Surgical History:  ?Procedure Laterality Date  ? LUNG SURGERY    ? removed tissue  ? ? ?MEDICATIONS: ? albuterol (PROAIR HFA) 108 (90 Base) MCG/ACT inhaler  ? albuterol (PROVENTIL) (2.5 MG/3ML) 0.083% nebulizer solution  ? AMITIZA 24 MCG capsule  ? amLODipine (NORVASC) 10 MG tablet  ? Ascorbic Acid (VITAMIN C PO)  ? aspirin EC 81 MG tablet  ? atorvastatin (LIPITOR) 40 MG tablet  ? budesonide-formoterol (SYMBICORT) 160-4.5 MCG/ACT inhaler  ? cetirizine  (ZYRTEC ALLERGY) 10 MG tablet  ? clopidogrel (PLAVIX) 75 MG tablet  ? diclofenac Sodium (VOLTAREN) 1 % GEL  ? escitalopram (LEXAPRO) 20 MG tablet  ? ferrous sulfate 325 (65 FE) MG tablet  ? gabapentin (NEURONTIN) 300 MG capsule  ? guaiFENesin-codeine (ROBITUSSIN AC) 100-10 MG/5ML syrup  ? hydrochlorothiazide (HYDRODIURIL) 12.5 MG tablet  ? hydrOXYzine (VISTARIL) 50 MG capsule  ? metFORMIN (GLUCOPHAGE) 500 MG tablet  ? montelukast (SINGULAIR) 10 MG tablet  ? Multiple Vitamin (MULTIVITAMIN WITH MINERALS) TABS tablet  ? naphazoline-pheniramine (VISINE) 0.025-0.3 % ophthalmic solution  ? nicotine (NICODERM CQ) 14 mg/24hr patch  ? nitroGLYCERIN (NITROSTAT) 0.4 MG SL tablet  ? Omega-3 Fatty Acids (FISH OIL TRIPLE STRENGTH PO)  ? Oxycodone HCl 20 MG TABS  ? pantoprazole (PROTONIX) 40 MG tablet  ? predniSONE (DELTASONE) 10 MG tablet  ? sildenafil (VIAGRA) 100 MG tablet  ? Tiotropium Bromide Monohydrate (SPIRIVA RESPIMAT) 2.5 MCG/ACT AERS  ? vitamin B-12 1000 MCG tablet  ? Vitamin D, Ergocalciferol, (DRISDOL) 1.25 MG (50000 UNIT) CAPS capsule  ? XTAMPZA ER 9 MG C12A  ? ?No current facility-administered medications for this encounter.  ? ?- Pt to stop plavix 3-5 days before surgery ? ? ?If no changes, I anticipate pt can proceed with surgery as scheduled.  ? ?Willeen Cass, PhD, FNP-BC ?[facility]/Anesthesiology ?Phone: [phone] ?04/21/2021 2:16 PM ? ? ? ? ?

## 2021-04-21 NOTE — Anesthesia Preprocedure Evaluation (Addendum)
Anesthesia Evaluation  ? ? ?Reviewed: ?Allergy & Precautions, Patient's Chart, lab work & pertinent test results, Unable to perform ROS - Chart review only ? ?Airway ? ? ? ? ? ? ? Dental ?  ?Pulmonary ?shortness of breath, asthma , COPD, former smoker,  ?  ? ? ? ? ? ? ? Cardiovascular ?hypertension, Pt. on medications ? ? ?Echo 04/2020 ??1. Left ventricular ejection fraction, by estimation, is 55 to 60%. The left ventricle has normal function. The left ventricle has no regional wall motion abnormalities. There is mild concentric left ventricular hypertrophy. Left ventricular diastolic parameters are consistent with Grade I diastolic dysfunction (impaired relaxation).  ??2. Right ventricular systolic function is normal. The right ventricular size is normal. There is normal pulmonary artery systolic pressure.  ??3. The mitral valve is degenerative. Trivial mitral valve regurgitation.  ??4. The aortic valve is tricuspid. Aortic valve regurgitation is not visualized. No aortic stenosis is present.  ??5. The inferior vena cava is dilated in size with >50% respiratory variability, suggesting right atrial pressure of 8 mmHg.  ?  ?Neuro/Psych ?PSYCHIATRIC DISORDERS Anxiety Depression  Neuromuscular disease CVA   ? GI/Hepatic ?negative GI ROS, Neg liver ROS,   ?Endo/Other  ?diabetes ? Renal/GU ?negative Renal ROS  ? ?  ?Musculoskeletal ? ?(+) Arthritis ,  ? Abdominal ?  ?Peds ? Hematology ?negative hematology ROS ?(+)   ?Anesthesia Other Findings ? ? Reproductive/Obstetrics ? ?  ? ? ? ? ? ? ? ? ? ? ? ? ? ?  ?  ? ? ? ? ? ? ?Anesthesia Physical ?Anesthesia Plan ? ?ASA: 3 ? ?Anesthesia Plan: General  ? ?Post-op Pain Management:   ? ?Induction: Intravenous ? ?PONV Risk Score and Plan: Ondansetron, Dexamethasone and Treatment may vary due to age or medical condition ? ?Airway Management Planned:  ? ?Additional Equipment:  ? ?Intra-op Plan:  ? ?Post-operative Plan: Extubation in OR ? ?Informed  Consent: I have reviewed the patients History and Physical, chart, labs and discussed the procedure including the risks, benefits and alternatives for the proposed anesthesia with the patient or authorized representative who has indicated his/her understanding and acceptance.  ? ? ? ?Dental advisory given ? ?Plan Discussed with: CRNA ? ?Anesthesia Plan Comments: (See APP note by Durel Salts, FNP  ? ?Pt using nitroglycerine with increasing frequency. Never seen by cardiology. Discussed with Dr. Ninfa Linden. This case is elective and Dr. Ninfa Linden agrees patient needs cardiology eval prior to proceeding with surgery. ? ?)  ? ? ? ?Anesthesia Quick Evaluation ? ?

## 2021-04-27 ENCOUNTER — Other Ambulatory Visit (HOSPITAL_COMMUNITY): Payer: Self-pay

## 2021-04-28 NOTE — H&P (Signed)
?REFERRING PHYSICIAN: Dorrene [name], Glenn A, MD ? ?PROVIDER: Wayne BothUGLAS ALLEN Dajanay Northrup, MD ? ?MRN: [id number] ?DOB: 10-10-1960 ? ?Subjective  ? ?Chief Complaint: Follow-up ? ? ?History of Present Illness: ?Glenn [name] is [name] 61 y.o. male who is seen as an office consultation at the request of Glenn [name] for evaluation of Follow-up ?.  ? ?This is [name] 61 year old gentleman referred by his primary care provider for right breast mass. He reports he has had increasing size of the mass over the several months and has become painful. He has [name] difficult time laying on his abdomen at night sleeping secondary to the mass. He had [name] mammogram showing gynecomastia on the right side. He has had [name] previous history of stroke. He is also had surgery on his right lung approximately 12 years ago. There is no family history of breast cancer. He denies nipple discharge. ? ?Review of Systems: ?[name] complete review of systems was obtained from the patient. I have reviewed this information and discussed as appropriate with the patient. See HPI as well for other ROS. ? ?ROS  ? ?Medical History: ?Past Medical History:  ?Diagnosis Date  ? Arthritis  ? Asthma, unspecified asthma severity, unspecified whether complicated, unspecified whether persistent  ? COPD (chronic obstructive pulmonary disease) (CMS-HCC)  ? History of stroke  ? Hypertension  ? ?There is no problem list on file for this patient. ? ?History reviewed. No pertinent surgical history.  ? ?Allergies  ?Allergen Reactions  ? Insulins Swelling  ? Acetaminophen Nausea  ?Abdominal sensitvity ?Abdominal sensitvity ? ? Ibuprofen Nausea And Vomiting  ?Abdominal sensitivity ?Abdominal sensitivity ? ? Shellfish Containing Products Itching  ? Lisinopril Other (See Comments) and Rash  ?Tongue swelling ?Tongue swelling ?Tongue swelling ? ? ?Current Outpatient Medications on File Prior to Visit  ?Medication Sig Dispense Refill  ? ergocalciferol, vitamin D2, 1,250 mcg (50,000 unit) capsule Take by mouth  ?  meloxicam (MOBIC) 15 MG tablet Take by mouth  ? metaxalone (SKELAXIN) 800 mg tablet Take 800 mg by mouth 3 (three) times daily  ? multivitamin with minerals tablet Take 1 tablet by mouth once daily  ? tiotropium bromide (SPIRIVA RESPIMAT) 2.5 mcg/actuation inhalation spray 2 puffs each am  ? ACCU-CHEK GUIDE ME GLUCOSE MTR Misc USE TO TEST BLOOD GLUCOSE DAILY  ? ACCU-CHEK GUIDE TEST STRIPS test strip TEST BLOOD GLUCOSE DAILY  ? ACCU-CHEK SOFTCLIX LANCETS lancets USE TO CHECK BLOOD GLUCOSE DAILY (REFILL ONCE EVERY 3 MONTHS, DO NOT PUT ON READYFILL)  ? albuterol (ACCUNEB) 0.63 mg/3 mL nebulizer solution Inhale into the lungs  ? albuterol (PROVENTIL) 2.5 mg /3 mL (0.083 %) nebulizer solution  ? AMITIZA 24 mcg capsule Take 24 mcg by mouth every morning  ? amLODIPine (NORVASC) 10 MG tablet Take 10 mg by mouth once daily  ? aspirin 81 MG EC tablet Take by mouth once daily  ? atorvastatin (LIPITOR) 40 MG tablet Take 40 mg by mouth once daily  ? BELBUCA 150 mcg buccal film 1 (ONE) FILM INSIDE CHEEKS TWO TIMES DAILY  ? benzonatate (TESSALON) 100 MG capsule TAKE 1 CAPSULE BY MOUTH EVERY 8 HOURS AS NEEDED FOR COUGH  ? cetirizine (ZYRTEC) 10 MG tablet  ? chlorhexidine (PERIDEX) 0.12 % solution as directed  ? clopidogreL (PLAVIX) 75 mg tablet Take 75 mg by mouth once daily  ? codeine-guaifenesin 10-100 mg/5 mL oral liquid TAKE 10 ML BY MOUTH TWICE [name] DAY AS NEEDED  ? diclofenac (VOLTAREN) 1 % topical gel APPLY 4G TO AFFECTED AREA(S) 4 TIMES  [name] DAY AS NEEDED FOR PAIN  ? DULoxetine (CYMBALTA) 20 MG DR capsule TAKE ONCE DAILY WITHOUT FOOD  ? escitalopram oxalate (LEXAPRO) 20 MG tablet Take 20 mg by mouth every evening  ? ferrous sulfate 325 (65 FE) MG tablet Take 325 mg by mouth once daily  ? fluticasone furoate 100 mcg/actuation DsDv Inhale into the lungs  ? gabapentin (NEURONTIN) 300 MG capsule Take 300 mg by mouth 3 (three) times daily  ? glipiZIDE (GLUCOTROL) 5 MG tablet TAKE 0.5 TABLETS (2.5 MG TOTAL) BY MOUTH 2 (TWO) TIMES DAILY  FOR 20 DAYS.  ? hydroCHLOROthiazide (HYDRODIURIL) 12.5 MG tablet Take 12.5 mg by mouth once daily  ? hydrOXYzine (VISTARIL) 50 MG capsule TAKE NIGHTLY AT BEDTIME FOR SLEEP  ? metFORMIN (GLUCOPHAGE) 500 MG tablet Take 500 mg by mouth every morning  ? methylPREDNISolone (MEDROL DOSEPACK) 4 mg tablet TAKE 6 TABLETS ON DAY 1 AS DIRECTED ON PACKAGE AND DECREASE BY 1 TAB EACH DAY FOR [name] TOTAL OF 6 DAYS  ? montelukast (SINGULAIR) 10 mg tablet Take 10 mg by mouth at bedtime  ? MUCUS RELIEF ER 600 mg SR tablet as directed  ? nicotine (NICODERM CQ) 14 mg/24 hr patch 1 patch once daily  ? nitroGLYcerin (NITROSTAT) 0.4 MG SL tablet PLACE 1 TABLET UNDER THE TONGUE EVERY 5 MINUTES FOR 3 DOSES AS NEEDED FOR CHEST PAIN  ? pantoprazole (PROTONIX) 40 MG DR tablet Take 40 mg by mouth once daily  ? predniSONE (DELTASONE) 10 mg tablet pack PLEASE SEE ATTACHED FOR DETAILED DIRECTIONS  ? sertraline (ZOLOFT) 50 MG tablet Take 50 mg by mouth every morning  ? sildenafiL (VIAGRA) 100 MG tablet  ? SYMBICORT 160-4.5 mcg/actuation inhaler 2 inhalations 2 (two) times daily  ? tamsulosin (FLOMAX) 0.4 mg capsule Take by mouth every evening  ? traMADoL (ULTRAM) 50 mg tablet Take 50 mg by mouth every 6 (six) hours as needed  ? VITAMIN B-12 1000 MCG tablet Take 1,000 mcg by mouth once daily  ? ?No current facility-administered medications on file prior to visit.  ? ?Family History  ?Family history unknown: Yes  ? ? ?Social History  ? ?Tobacco Use  ?Smoking Status Never  ?Smokeless Tobacco Never  ? ? ?Social History  ? ?Socioeconomic History  ? Marital status: Legally Separated  ?Tobacco Use  ? Smoking status: Never  ? Smokeless tobacco: Never  ?Substance and Sexual Activity  ? Alcohol use: Never  ? Drug use: Never  ? ?Objective:  ? ?Vitals:  ? ?BP: (!) 148/80  ?Pulse: 99  ?Temp: 36.8 ?C (98.3 ?F)  ?SpO2: 98%  ?Weight: 67.3 kg (148 lb 6.4 oz)  ?Height: 165.1 cm (5\' 5" )  ? ?Body mass index is 24.7 kg/m?. ? ?Physical Exam  ? ?On exam, he has gynecomastia  of the right breast without evidence of that on the left. It is mildly diffusely tender. There is no axillary adenopathy. Has multiple well-healed scars on his chest ? ?Labs, Imaging and Diagnostic Testing: ?Reviewed his mammogram from November 2022 ? ?Assessment and Plan:  ? ?Diagnoses and all orders for this visit: ? ?Gynecomastia, male ? ? ? ?I explained the diagnosis of gynecomastia to the patient. Given his size of gynecomastia, this will require mastectomy we will try to spare the nipple. Because of his discomfort and rapid increase in size, malignancy cannot be entirely excluded despite the mammogram. I explained this to him as well. We also discussed continued nonoperative management. He would like to proceed with surgery given his  symptoms. I discussed the risks which include but is not limited to bleeding, infection, cardiopulmonary issues, stroke given his previous history, the need for drain for several weeks postoperatively, postoperative recovery, etc. After Leamy discussion, he still wishes to proceed with surgery which will be scheduled.  ?

## 2021-04-29 ENCOUNTER — Other Ambulatory Visit: Payer: Self-pay

## 2021-04-29 ENCOUNTER — Encounter (HOSPITAL_COMMUNITY): Payer: Self-pay | Admitting: Surgery

## 2021-04-29 ENCOUNTER — Encounter (HOSPITAL_COMMUNITY)
Admission: RE | Disposition: A | Discharge status: Home / Self Care | Payer: Self-pay | Source: Home / Self Care | Attending: Surgery

## 2021-04-29 ENCOUNTER — Telehealth: Payer: Self-pay | Admitting: *Deleted

## 2021-04-29 ENCOUNTER — Telehealth: Payer: Self-pay

## 2021-04-29 ENCOUNTER — Ambulatory Visit (HOSPITAL_COMMUNITY)
Admission: RE | Admit: 2021-04-29 | Discharge: 2021-04-29 | Disposition: A | Discharge status: Home / Self Care | Payer: Medicaid Other | Attending: Surgery | Admitting: Surgery

## 2021-04-29 DIAGNOSIS — Z87891 Personal history of nicotine dependence: Secondary | ICD-10-CM | POA: Diagnosis not present

## 2021-04-29 DIAGNOSIS — E119 Type 2 diabetes mellitus without complications: Secondary | ICD-10-CM | POA: Insufficient documentation

## 2021-04-29 DIAGNOSIS — J449 Chronic obstructive pulmonary disease, unspecified: Secondary | ICD-10-CM | POA: Diagnosis not present

## 2021-04-29 DIAGNOSIS — I10 Essential (primary) hypertension: Secondary | ICD-10-CM | POA: Insufficient documentation

## 2021-04-29 DIAGNOSIS — Z539 Procedure and treatment not carried out, unspecified reason: Secondary | ICD-10-CM | POA: Diagnosis not present

## 2021-04-29 DIAGNOSIS — N6310 Unspecified lump in the right breast, unspecified quadrant: Secondary | ICD-10-CM | POA: Insufficient documentation

## 2021-04-29 LAB — GLUCOSE, CAPILLARY: Glucose-Capillary: 146 mg/dL — ABNORMAL HIGH (ref 70–99)

## 2021-04-29 SURGERY — MASTECTOMY, SIMPLE
Anesthesia: General | Site: Breast | Laterality: Right

## 2021-04-29 MED ORDER — LACTATED RINGERS IV SOLN: INTRAVENOUS | Status: DC

## 2021-04-29 MED ORDER — LIDOCAINE 2% (20 MG/ML) 5 ML SYRINGE
INTRAMUSCULAR | Status: AC
  Filled 2021-04-29: qty 5

## 2021-04-29 MED ORDER — MIDAZOLAM HCL 2 MG/2ML IJ SOLN
INTRAMUSCULAR | Status: AC
  Filled 2021-04-29: qty 2

## 2021-04-29 MED ORDER — ROCURONIUM BROMIDE 10 MG/ML (PF) SYRINGE
PREFILLED_SYRINGE | INTRAVENOUS | Status: AC
  Filled 2021-04-29: qty 10

## 2021-04-29 MED ORDER — CEFAZOLIN SODIUM-DEXTROSE 2-4 GM/100ML-% IV SOLN
2.0000 g | INTRAVENOUS | Status: DC
  Filled 2021-04-29: qty 100

## 2021-04-29 MED ORDER — CHLORHEXIDINE GLUCONATE CLOTH 2 % EX PADS
6.0000 | MEDICATED_PAD | Freq: Once | CUTANEOUS | Status: DC
Start: 2021-04-29 — End: 2021-04-29

## 2021-04-29 MED ORDER — ENSURE PRE-SURGERY PO LIQD: 296.0000 mL | Freq: Once | ORAL | Status: DC

## 2021-04-29 MED ORDER — ORAL CARE MOUTH RINSE: 15.0000 mL | Freq: Once | OROMUCOSAL | Status: AC

## 2021-04-29 MED ORDER — DEXAMETHASONE SODIUM PHOSPHATE 10 MG/ML IJ SOLN
INTRAMUSCULAR | Status: AC
  Filled 2021-04-29: qty 1

## 2021-04-29 MED ORDER — PROPOFOL 10 MG/ML IV BOLUS
INTRAVENOUS | Status: AC
  Filled 2021-04-29: qty 20

## 2021-04-29 MED ORDER — ONDANSETRON HCL 4 MG/2ML IJ SOLN
INTRAMUSCULAR | Status: AC
  Filled 2021-04-29: qty 2

## 2021-04-29 MED ORDER — FENTANYL CITRATE (PF) 250 MCG/5ML IJ SOLN
INTRAMUSCULAR | Status: AC
  Filled 2021-04-29: qty 5

## 2021-04-29 MED ORDER — CHLORHEXIDINE GLUCONATE CLOTH 2 % EX PADS: 6.0000 | MEDICATED_PAD | Freq: Once | CUTANEOUS | Status: DC

## 2021-04-29 MED ORDER — CHLORHEXIDINE GLUCONATE 0.12 % MT SOLN
15.0000 mL | Freq: Once | OROMUCOSAL | Status: AC
  Administered 2021-04-29: 15 mL via OROMUCOSAL
  Filled 2021-04-29: qty 15

## 2021-04-29 MED ORDER — INSULIN ASPART 100 UNIT/ML IJ SOLN: 0.0000 [IU] | INTRAMUSCULAR | Status: DC | PRN

## 2021-04-29 NOTE — Progress Notes (Signed)
Patient's surgery cancelled per Dr. Ninfa Linden and Dr. Lissa Hoard. Patient to follow up with cardiology. ? ?Patient being discharged home.  ?

## 2021-04-29 NOTE — Interval H&P Note (Signed)
History and Physical Interval Note: no change in H and P ? ?04/29/2021 ?7:13 AM ? ?Glenn [name]  has presented today for surgery, with the diagnosis of RIGHT GYNECOMASTIA.  The various methods of treatment have been discussed with the patient and family. After consideration of risks, benefits and other options for treatment, the patient has consented to  Procedure(s): ?RIGHT TOTAL MASTECTOMY (Right) as a surgical intervention.  The patient's history has been reviewed, patient examined, no change in status, stable for surgery.  I have reviewed the patient's chart and labs.  Questions were answered to the patient's satisfaction.   ? ? ?Abigail Miyamoto ? ? ?

## 2021-04-29 NOTE — Telephone Encounter (Signed)
? ?  Pre-operative Risk Assessment  ?  ?Patient Name: Glenn [name]  ?DOB: 01-25-1961 ?MRN: [id number]  ? ? STAT REFERRAL RECEIVED TODAY FOR CHEST PAIN AND PRE OP CLEARANCE. I WILL SEND MESSAGE TO CHART PREP TEAM TO REACH OUT FOR STAT NEW PT APPT  ? ?Request for Surgical Clearance   ? ?Procedure:   RIGHT TOTAL MASTECTOMY ? ?Date of Surgery:  Clearance TBD                              ?   ?Surgeon:  DR. Coralie Keens ?Surgeon's Group or Practice Name:  [facility] ?Phone number:  [phone] ?Fax number:  [phone] ATTN: Karl Bales, MA ?  ?Type of Clearance Requested:   ?- Medical  ?- Pharmacy:  Hold Aspirin and Clopidogrel (Plavix)   ?  ?Type of Anesthesia:  General  ?  ?Additional requests/questions:   ? ?Signed, ?Julaine Hua   ?04/29/2021, 2:47 PM  ? ?

## 2021-04-29 NOTE — Telephone Encounter (Signed)
NOTES SCANNED TO REFERRAL 

## 2021-04-29 NOTE — Progress Notes (Signed)
Patient ID: Glenn [name], male   DOB: Jun 29, 1960, 61 y.o.   MRN: [id number] ? ? ?This morning, while being questioned by the anesthesiologist, the patient admits that he has been having increasing chest pain and needing to use nitroglycerin further.  He reports that several weeks ago he had an episode of arm numbness and some blurriness in his vision.  He has not had a recent work-up by cardiology. ? ?Given this history, I agree with the anesthesiologist that the surgery needs to be canceled and the patient needs to be referred to cardiology for further preoperative work-up. ? ?Again, he has gynecomastia which is likely benign and the surgery is not urgent/emergent. ? ?We discussed this with the patient who agrees ?

## 2021-04-30 NOTE — Telephone Encounter (Signed)
Pt has an appt with Dr. Jens Som 05/04/21. Referral came over for pre op clearance and chest pain. I will forward notes to MD for upcoming appt. Will send FYI to requesting office pt has appt 05/04/21 ?

## 2021-05-03 NOTE — Progress Notes (Signed)
? ? ?Referring-Douglas Ninfa Linden, MD ?Reason for referral-preoperative evaluation prior to right total mastectomy and chest pain. ? ?HPI: 61 year old male for evaluation of chest pain and preoperative evaluation prior to right total mastectomy at request of Coralie Keens, MD.  Echocardiogram May 2022 showed normal LV function, mild left ventricular hypertrophy, grade 1 diastolic dysfunction and negative microcavitation study.  Carotid Dopplers May 2022 showed 1 to 39% bilateral stenosis.  Patient does have dyspnea on exertion that he attributes to COPD.  No orthopnea, PND, pedal edema or syncope.  He does have occasional chest tightness that is in the left breast area that does not radiate.  It lasts several minutes and resolve spontaneously.  Not clearly exertional.  Not pleuritic.  Some associated diaphoresis and dyspnea but no nausea. ? ?Current Outpatient Medications  ?Medication Sig Dispense Refill  ? ACCU-CHEK GUIDE test strip TEST BLOOD GLUCOSE DAILY    ? Accu-Chek Softclix Lancets lancets SMARTSIG:Topical Once a Month    ? albuterol (PROAIR HFA) 108 (90 Base) MCG/ACT inhaler 2 puffs every 4 hours as needed only  if your can't catch your breath (Patient taking differently: 2 puffs in the morning, at noon, in the evening, and at bedtime.) 18 g 11  ? albuterol (PROVENTIL) (2.5 MG/3ML) 0.083% nebulizer solution PLEASE SEE ATTACHED FOR DETAILED DIRECTIONS (Patient taking differently: Take 2.5 mg by nebulization every 6 (six) hours as needed for wheezing or shortness of breath.) 150 mL 2  ? AMITIZA 24 MCG capsule Take 24 mcg by mouth daily as needed for constipation.    ? amLODipine (NORVASC) 10 MG tablet Take 10 mg by mouth daily.    ? Ascorbic Acid (VITAMIN C PO) Take 2 tablets by mouth daily.    ? aspirin EC 81 MG tablet Take 81 mg by mouth daily. Swallow whole.    ? atorvastatin (LIPITOR) 40 MG tablet Take 1 tablet (40 mg total) by mouth daily. 30 tablet 2  ? budesonide-formoterol (SYMBICORT) 160-4.5  MCG/ACT inhaler Inhale 2 puffs into the lungs 2 (two) times daily. 1 each 11  ? cetirizine (ZYRTEC ALLERGY) 10 MG tablet Take 1 tablet (10 mg total) by mouth at bedtime. (Patient taking differently: Take 10 mg by mouth daily as needed for allergies.) 30 tablet 6  ? clopidogrel (PLAVIX) 75 MG tablet Take 1 tablet (75 mg total) by mouth daily. 90 tablet 3  ? diclofenac Sodium (VOLTAREN) 1 % GEL Apply 1 application. topically daily as needed for pain.    ? gabapentin (NEURONTIN) 300 MG capsule Take 1 capsule (300 mg total) by mouth 3 (three) times daily. (Patient taking differently: Take 300 mg by mouth 3 (three) times daily as needed (siactic nerve).) 50 capsule 0  ? hydrochlorothiazide (HYDRODIURIL) 12.5 MG tablet Take 12.5 mg by mouth daily as needed (fluid).    ? hydrOXYzine (VISTARIL) 50 MG capsule Take 50 mg by mouth at bedtime as needed for anxiety.    ? metFORMIN (GLUCOPHAGE) 500 MG tablet Take 500 mg by mouth daily as needed (if sugar level is high).    ? montelukast (SINGULAIR) 10 MG tablet Take 1 tablet (10 mg total) by mouth at bedtime. (Patient taking differently: Take 10 mg by mouth daily as needed (allergies).) 30 tablet 6  ? naphazoline-pheniramine (VISINE) 0.025-0.3 % ophthalmic solution Place 1 drop into both eyes 4 (four) times daily as needed for eye irritation.    ? nitroGLYCERIN (NITROSTAT) 0.4 MG SL tablet Place 0.4 mg under the tongue every 5 (five) minutes as needed  for chest pain.    ? Omega-3 Fatty Acids (FISH OIL TRIPLE STRENGTH PO) Take 1,000 mg by mouth daily.    ? Oxycodone HCl 20 MG TABS Take 20 mg by mouth in the morning, at noon, in the evening, and at bedtime.    ? pantoprazole (PROTONIX) 40 MG tablet Take 1 tablet (40 mg total) by mouth daily. 30 tablet 0  ? predniSONE (DELTASONE) 10 MG tablet Take 10 mg by mouth daily as needed (Asthma).    ? sildenafil (VIAGRA) 100 MG tablet Take 1 tablet (100 mg total) by mouth daily as needed. 30 tablet 5  ? tamsulosin (FLOMAX) 0.4 MG CAPS  capsule Take 1 capsule by mouth every evening.    ? Tiotropium Bromide Monohydrate (SPIRIVA RESPIMAT) 2.5 MCG/ACT AERS 2 puffs each am (Patient taking differently: Inhale 1 puff into the lungs every 12 (twelve) hours.) 1 each 11  ? vitamin B-12 1000 MCG tablet Take 1 tablet (1,000 mcg total) by mouth daily. 30 tablet 0  ? Vitamin D, Ergocalciferol, (DRISDOL) 1.25 MG (50000 UNIT) CAPS capsule Take 50,000 Units by mouth once a week. Mondays    ? XTAMPZA ER 9 MG C12A Take 1 capsule by mouth 2 (two) times daily.    ? ferrous sulfate 325 (65 FE) MG tablet Take 1 tablet (325 mg total) by mouth daily with breakfast. 30 tablet 0  ? ?No current facility-administered medications for this visit.  ? ? ?Allergies  ?Allergen Reactions  ? Insulins Swelling  ? Ibuprofen   ?  Abdominal sensitivity  ? Shrimp [Shellfish Allergy] Itching  ? Tylenol [Acetaminophen]   ?  Abdominal sensitvity  ? Lisinopril Swelling and Rash  ?  Tongue swelling  ? ? ? ?Past Medical History:  ?Diagnosis Date  ? Anxiety   ? Arthritis   ? Asthma   ? Chronic pain 05/06/2020  ? COPD (chronic obstructive pulmonary disease) ([place])   ? CVA (cerebral vascular accident) [facility])   ? 2010; 06/2020  ? Depression   ? Diabetes mellitus type 2 in nonobese [facility])   ? Dyspnea   ? ED (erectile dysfunction)   ? Hyperlipidemia   ? Hypertension   ? Sarcoid   ? Tobacco dependence 05/06/2020  ? ? ?Past Surgical History:  ?Procedure Laterality Date  ? LUNG SURGERY    ? removed tissue  ? ? ?Social History  ? ?Socioeconomic History  ? Marital status: Married  ?  Spouse name: Not on file  ? Number of children: Not on file  ? Years of education: Not on file  ? Highest education level: Not on file  ?Occupational History  ? Occupation: disabled  ?Tobacco Use  ? Smoking status: Former  ?  Packs/day: 0.50  ?  Years: 34.00  ?  Pack years: 17.00  ?  Types: Cigarettes  ?  Quit date: 05/11/2020  ?  Years since quitting: 0.9  ? Smokeless tobacco: Never  ? Tobacco comments:  ?  at the most smoked 6-7  cigarettes/day  ?Vaping Use  ? Vaping Use: Never used  ?Substance and Sexual Activity  ? Alcohol use: Never  ? Drug use: Never  ? Sexual activity: Not on file  ?Other Topics Concern  ? Not on file  ?Social History Narrative  ? Lives with wife  ? ?Social Determinants of Health  ? ?Financial Resource Strain: Not on file  ?Food Insecurity: Not on file  ?Transportation Needs: Not on file  ?Physical Activity: Not on file  ?Stress: Not  on file  ?Social Connections: Not on file  ?Intimate Partner Violence: Not on file  ? ? ?Family History  ?Problem Relation Age of Onset  ? Hypertension Mother   ? Diabetes Maternal Grandmother   ? Colon cancer Neg Hx   ? Esophageal cancer Neg Hx   ? Rectal cancer Neg Hx   ? Inflammatory bowel disease Neg Hx   ? Liver disease Neg Hx   ? Pancreatic cancer Neg Hx   ? ? ?ROS: no fevers or chills, productive cough, hemoptysis, dysphasia, odynophagia, melena, hematochezia, dysuria, hematuria, rash, seizure activity, orthopnea, PND, pedal edema, claudication. Remaining systems are negative. ? ?Physical Exam:  ? ?Blood pressure (!) 150/100, pulse (!) 103, height 5\' 5"  (1.651 m), weight 149 lb 3.2 oz (67.7 kg), SpO2 95 %. ? ?General:  Well developed/well nourished in NAD ?Skin warm/dry ?Patient not depressed ?No peripheral clubbing ?Back-normal ?HEENT-normal/normal eyelids ?Neck supple/normal carotid upstroke bilaterally; no bruits; no JVD; no thyromegaly ?chest - CTA/ normal expansion ?CV - RRR/normal S1 and S2; no murmurs, rubs or gallops;  PMI nondisplaced ?Abdomen -NT/ND, no HSM, no mass, + bowel sounds, no bruit ?2+ femoral pulses, no bruits ?Ext-no edema, chords, 2+ DP ?Neuro-grossly nonfocal ? ?ECG -April 29, 2021 normal sinus rhythm with no ST changes.  Personally reviewed ? ?A/P ? ?1 preoperative evaluation prior to mastectomy-patient does have risk factors and recent atypical chest pain.  I will arrange a stress nuclear study to screen for ischemia.  If normal he may proceed. ? ?2 chest  pain-symptoms somewhat atypical.  May be a contribution from pulmonary disease as he does have COPD and his description is chest tightness.  I will arrange a stress nuclear study to screen for ischemia.  I have not

## 2021-05-04 ENCOUNTER — Other Ambulatory Visit: Payer: Self-pay

## 2021-05-04 ENCOUNTER — Encounter: Payer: Self-pay | Admitting: Cardiology

## 2021-05-04 ENCOUNTER — Ambulatory Visit (INDEPENDENT_AMBULATORY_CARE_PROVIDER_SITE_OTHER): Payer: Medicaid Other | Admitting: Cardiology

## 2021-05-04 VITALS — BP 150/100 | HR 103 | Ht 65.0 in | Wt 149.2 lb

## 2021-05-04 DIAGNOSIS — Z01810 Encounter for preprocedural cardiovascular examination: Secondary | ICD-10-CM

## 2021-05-04 DIAGNOSIS — R072 Precordial pain: Secondary | ICD-10-CM | POA: Diagnosis not present

## 2021-05-04 DIAGNOSIS — I10 Essential (primary) hypertension: Secondary | ICD-10-CM | POA: Diagnosis not present

## 2021-05-04 NOTE — Patient Instructions (Signed)
?  Testing/Procedures: ? ?Your physician has requested that you have en exercise stress myoview. For further information please visit [email or web address] Please follow instruction sheet, as given. [address] ? ? ?Follow-Up: ?At [facility], you and your health needs are our priority.  As part of our continuing mission to provide you with exceptional heart care, we have created designated Provider Care Teams.  These Care Teams include your primary Cardiologist (physician) and Advanced Practice Providers (APPs -  Physician Assistants and Nurse Practitioners) who all work together to provide you with the care you need, when you need it. ? ?We recommend signing up for the patient portal called "MyChart".  Sign up information is provided on this After Visit Summary.  MyChart is used to connect with patients for Virtual Visits (Telemedicine).  Patients are able to view lab/test results, encounter notes, upcoming appointments, etc.  Non-urgent messages can be sent to your provider as well.   ?To learn more about what you can do with MyChart, go to [email or web address].   ? ?Your next appointment:   ? ?AS NEEDED ?

## 2021-05-10 ENCOUNTER — Telehealth (HOSPITAL_COMMUNITY): Payer: Self-pay

## 2021-05-10 NOTE — Telephone Encounter (Signed)
Spoke with the patient, detailed instructions given. He stated that he would be here for his test. Asked to call back with any questions. S.Chessa Barrasso EMTP 

## 2021-05-11 ENCOUNTER — Other Ambulatory Visit: Payer: Self-pay | Admitting: Adult Health

## 2021-05-12 ENCOUNTER — Ambulatory Visit (INDEPENDENT_AMBULATORY_CARE_PROVIDER_SITE_OTHER): Payer: Medicaid Other | Admitting: Pulmonary Disease

## 2021-05-12 ENCOUNTER — Encounter: Payer: Self-pay | Admitting: Pulmonary Disease

## 2021-05-12 ENCOUNTER — Ambulatory Visit: Payer: Medicaid Other | Admitting: Internal Medicine

## 2021-05-12 ENCOUNTER — Ambulatory Visit: Payer: Medicaid Other | Admitting: Pulmonary Disease

## 2021-05-12 VITALS — BP 150/90 | HR 95 | Ht 65.0 in | Wt 147.0 lb

## 2021-05-12 DIAGNOSIS — J449 Chronic obstructive pulmonary disease, unspecified: Secondary | ICD-10-CM

## 2021-05-12 MED ORDER — AZITHROMYCIN 250 MG PO TABS: ORAL_TABLET | ORAL | 0 refills | Status: DC

## 2021-05-12 MED ORDER — ALBUTEROL SULFATE (2.5 MG/3ML) 0.083% IN NEBU: 2.5000 mg | INHALATION_SOLUTION | Freq: Four times a day (QID) | RESPIRATORY_TRACT | 11 refills | Status: DC | PRN

## 2021-05-12 MED ORDER — PREDNISONE 10 MG PO TABS: 40.0000 mg | ORAL_TABLET | Freq: Every day | ORAL | 0 refills | Status: AC

## 2021-05-12 MED ORDER — SPIRIVA RESPIMAT 2.5 MCG/ACT IN AERS
1.0000 | INHALATION_SPRAY | Freq: Two times a day (BID) | RESPIRATORY_TRACT | 11 refills | Status: DC
Start: 2021-05-12 — End: 2021-09-30

## 2021-05-12 MED ORDER — BUDESONIDE-FORMOTEROL FUMARATE 160-4.5 MCG/ACT IN AERO
2.0000 | INHALATION_SPRAY | Freq: Two times a day (BID) | RESPIRATORY_TRACT | 11 refills | Status: DC
Start: 2021-05-12 — End: 2021-07-23

## 2021-05-12 NOTE — Patient Instructions (Signed)
?  COPD exacerbation ?--Prednisone 40 mg x 5 days ?--Azithromycin x 5 days ?--Refilled Symbicort and Spiriva x 1 year ?--Refilled Albuterol nebulizer ? ?Follow-up with Dr. Sherene Sires in 3 months in [place] ?

## 2021-05-12 NOTE — Progress Notes (Signed)
? ? ?Subjective:  ? ?PATIENT ID: Glenn [name] GENDER: male DOB: 06/08/1960, MRN: [id number] ? ? ?HPI ? ?Chief Complaint  ?Patient presents with  ? Follow-up  ?  COPD, using neb machine more than normal  ? ? ?Reason for Visit: Follow-up. Dr. Melvyn Novas ? ?Mr. Alred Donn is a 61 year old male former smoker with asthma, COPD, HTN, DM, CVA who presents for follow-up. ? ?[facility] EMR reviewed. He was scheduled for right mastectomy for enlarging right gynecomastia/mass. ?On day of surgery, he reports chest pain associated with arm numbness and visual blurriness. Case was cancelled. He  was seen by Cardiology on 05/04/21 for cardiac pre-op evaluation for right total mastectomy. He is scheduled for stress nuclear study for atypical chest pain to rule out ischemia. ? ?He presents for routine follow-up with Dr. Melvyn Novas however he is at the [facility]. He is followed for COPD. Compliant with Symbicort and Spiriva. He reports increased use of his nebulizer 3-4 times a day with recent increased growth of his right breast mass. Wheezing has worsened with the pollen. Nonproductive cough and shortness of breath as well. Usually has a cough during this time of the year. Reports last exacerbation in April 2022 for COPD exacerbation. He is triggered by allergens, smoke and illness. ? ?I have personally reviewed patient's past medical/family/social history, allergies, current medications. ? ?Past Medical History:  ?Diagnosis Date  ? Anxiety   ? Arthritis   ? Asthma   ? Chronic pain 05/06/2020  ? COPD (chronic obstructive pulmonary disease) ([place])   ? CVA (cerebral vascular accident) [facility])   ? 2010; 06/2020  ? Depression   ? Diabetes mellitus type 2 in nonobese [facility])   ? Dyspnea   ? ED (erectile dysfunction)   ? Hyperlipidemia   ? Hypertension   ? Sarcoid   ? Tobacco dependence 05/06/2020  ?  ? ?Family History  ?Problem Relation Age of Onset  ? Hypertension Mother   ? Diabetes Maternal Grandmother   ? Colon cancer Neg Hx   ? Esophageal cancer Neg Hx   ?  Rectal cancer Neg Hx   ? Inflammatory bowel disease Neg Hx   ? Liver disease Neg Hx   ? Pancreatic cancer Neg Hx   ?  ? ?Social History  ? ?Occupational History  ? Occupation: disabled  ?Tobacco Use  ? Smoking status: Former  ?  Packs/day: 0.50  ?  Years: 34.00  ?  Pack years: 17.00  ?  Types: Cigarettes  ?  Quit date: 05/11/2020  ?  Years since quitting: 1.0  ? Smokeless tobacco: Never  ? Tobacco comments:  ?  at the most smoked 6-7 cigarettes/day  ?Vaping Use  ? Vaping Use: Never used  ?Substance and Sexual Activity  ? Alcohol use: Never  ? Drug use: Never  ? Sexual activity: Not on file  ? ? ?Allergies  ?Allergen Reactions  ? Insulins Swelling  ? Ibuprofen   ?  Abdominal sensitivity  ? Shrimp [Shellfish Allergy] Itching  ? Tylenol [Acetaminophen]   ?  Abdominal sensitvity  ? Lisinopril Swelling and Rash  ?  Tongue swelling  ?  ? ?Outpatient Medications Prior to Visit  ?Medication Sig Dispense Refill  ? ACCU-CHEK GUIDE test strip TEST BLOOD GLUCOSE DAILY    ? Accu-Chek Softclix Lancets lancets SMARTSIG:Topical Once a Month    ? albuterol (PROAIR HFA) 108 (90 Base) MCG/ACT inhaler 2 puffs every 4 hours as needed only  if your can't catch your breath (  Patient taking differently: 2 puffs in the morning, at noon, in the evening, and at bedtime.) 18 g 11  ? AMITIZA 24 MCG capsule Take 24 mcg by mouth daily as needed for constipation.    ? amLODipine (NORVASC) 10 MG tablet Take 10 mg by mouth daily.    ? Ascorbic Acid (VITAMIN C PO) Take 2 tablets by mouth daily.    ? aspirin EC 81 MG tablet Take 81 mg by mouth daily. Swallow whole.    ? atorvastatin (LIPITOR) 40 MG tablet Take 1 tablet (40 mg total) by mouth daily. 30 tablet 2  ? cetirizine (ZYRTEC ALLERGY) 10 MG tablet Take 1 tablet (10 mg total) by mouth at bedtime. (Patient taking differently: Take 10 mg by mouth daily as needed for allergies.) 30 tablet 6  ? clopidogrel (PLAVIX) 75 MG tablet Take 1 tablet (75 mg total) by mouth daily. 90 tablet 3  ? diclofenac Sodium  (VOLTAREN) 1 % GEL Apply 1 application. topically daily as needed for pain.    ? gabapentin (NEURONTIN) 300 MG capsule Take 1 capsule (300 mg total) by mouth 3 (three) times daily. (Patient taking differently: Take 300 mg by mouth 3 (three) times daily as needed (siactic nerve).) 50 capsule 0  ? hydrochlorothiazide (HYDRODIURIL) 12.5 MG tablet Take 12.5 mg by mouth daily as needed (fluid).    ? hydrOXYzine (VISTARIL) 50 MG capsule Take 50 mg by mouth at bedtime as needed for anxiety.    ? metFORMIN (GLUCOPHAGE) 500 MG tablet Take 500 mg by mouth daily as needed (if sugar level is high).    ? montelukast (SINGULAIR) 10 MG tablet TAKE 1 TABLET BY MOUTH EVERYDAY AT BEDTIME 90 tablet 3  ? naphazoline-pheniramine (VISINE) 0.025-0.3 % ophthalmic solution Place 1 drop into both eyes 4 (four) times daily as needed for eye irritation.    ? nitroGLYCERIN (NITROSTAT) 0.4 MG SL tablet Place 0.4 mg under the tongue every 5 (five) minutes as needed for chest pain.    ? Omega-3 Fatty Acids (FISH OIL TRIPLE STRENGTH PO) Take 1,000 mg by mouth daily.    ? Oxycodone HCl 20 MG TABS Take 20 mg by mouth in the morning, at noon, in the evening, and at bedtime.    ? pantoprazole (PROTONIX) 40 MG tablet Take 1 tablet (40 mg total) by mouth daily. 30 tablet 0  ? predniSONE (DELTASONE) 10 MG tablet Take 10 mg by mouth daily as needed (Asthma).    ? sildenafil (VIAGRA) 100 MG tablet Take 1 tablet (100 mg total) by mouth daily as needed. 30 tablet 5  ? tamsulosin (FLOMAX) 0.4 MG CAPS capsule Take 1 capsule by mouth every evening.    ? vitamin B-12 1000 MCG tablet Take 1 tablet (1,000 mcg total) by mouth daily. 30 tablet 0  ? Vitamin D, Ergocalciferol, (DRISDOL) 1.25 MG (50000 UNIT) CAPS capsule Take 50,000 Units by mouth once a week. Mondays    ? XTAMPZA ER 9 MG C12A Take 1 capsule by mouth 2 (two) times daily.    ? albuterol (PROVENTIL) (2.5 MG/3ML) 0.083% nebulizer solution PLEASE SEE ATTACHED FOR DETAILED DIRECTIONS (Patient taking  differently: Take 2.5 mg by nebulization every 6 (six) hours as needed for wheezing or shortness of breath.) 150 mL 2  ? budesonide-formoterol (SYMBICORT) 160-4.5 MCG/ACT inhaler Inhale 2 puffs into the lungs 2 (two) times daily. 1 each 11  ? Tiotropium Bromide Monohydrate (SPIRIVA RESPIMAT) 2.5 MCG/ACT AERS 2 puffs each am (Patient taking differently: Inhale 1 puff into  the lungs every 12 (twelve) hours.) 1 each 11  ? ferrous sulfate 325 (65 FE) MG tablet Take 1 tablet (325 mg total) by mouth daily with breakfast. 30 tablet 0  ? ?No facility-administered medications prior to visit.  ? ? ?Review of Systems  ?Constitutional:  Negative for chills, diaphoresis, fever, malaise/fatigue and weight loss.  ?HENT:  Negative for congestion.   ?Respiratory:  Positive for cough, shortness of breath and wheezing. Negative for hemoptysis and sputum production.   ?Cardiovascular:  Negative for chest pain, palpitations and leg swelling.  ? ? ?Objective:  ? ?Vitals:  ? 05/12/21 1115  ?BP: (!) 150/90  ?Pulse: 95  ?SpO2: 95%  ?Weight: 147 lb (66.7 kg)  ?Height: 5\' 5"  (1.651 m)  ? ?SpO2: 95 % ?O2 Device: None (Room air) ? ?Physical Exam: ?General: Well-appearing, no acute distress ?HENT: [place], AT ?Eyes: EOMI, no scleral icterus ?Respiratory: Clear to auscultation bilaterally.  No crackles, wheezing or rales ?Cardiovascular: RRR, -M/R/G, no JVD ?Extremities:-Edema,-tenderness ?Neuro: AAO x4, CNII-XII grossly intact ?Psych: Normal mood, normal affect ? ?Data Reviewed: ? ?Imaging: ?CXR 11/18/20 - Bibasilar scarring ? ?   ?Assessment & Plan:  ? ?Discussion: ?61 year old male former smoker with asthma, COPD, HTN, DM, CVA who presents for follow-up. ? ?COPD exacerbation ?--Prednisone 40 mg x 5 days ?--Azithromycin x 5 days ?--Refilled Symbicort and Spiriva x 1 year ?--Refilled Albuterol nebulizer ? ?No orders of the defined types were placed in this encounter. ? ?Meds ordered this encounter  ?Medications  ? budesonide-formoterol (SYMBICORT)  160-4.5 MCG/ACT inhaler  ?  Sig: Inhale 2 puffs into the lungs 2 (two) times daily.  ?  Dispense:  1 each  ?  Refill:  11  ? Tiotropium Bromide Monohydrate (SPIRIVA RESPIMAT) 2.5 MCG/ACT AERS  ?  Sig: Inhale

## 2021-05-13 ENCOUNTER — Encounter (HOSPITAL_COMMUNITY): Payer: Medicaid Other

## 2021-05-14 ENCOUNTER — Telehealth (HOSPITAL_COMMUNITY): Payer: Self-pay | Admitting: *Deleted

## 2021-05-14 NOTE — Telephone Encounter (Signed)
Patient given detailed instructions per Myocardial Perfusion Study Information Sheet for the test on 05/20/2021 at 7:30. Patient notified to arrive 15 minutes early and that it is imperative to arrive on time for appointment to keep from having the test rescheduled. ? If you need to cancel or reschedule your appointment, please call the office within 24 hours of your appointment. . Patient verbalized understanding.Glenn [name] ? ? ?

## 2021-05-18 ENCOUNTER — Telehealth: Payer: Self-pay | Admitting: Internal Medicine

## 2021-05-18 NOTE — Telephone Encounter (Signed)
Called patient but he did not answer. Left message for him to call back.  

## 2021-05-20 ENCOUNTER — Encounter (HOSPITAL_COMMUNITY): Payer: Medicaid Other

## 2021-05-23 ENCOUNTER — Ambulatory Visit (HOSPITAL_COMMUNITY)
Admission: EM | Admit: 2021-05-23 | Discharge: 2021-05-23 | Disposition: A | Discharge status: Nursing Home (Includes ICF) | Payer: Medicaid Other | Attending: Internal Medicine | Admitting: Internal Medicine

## 2021-05-23 ENCOUNTER — Observation Stay (HOSPITAL_COMMUNITY)
Admission: EM | Admit: 2021-05-23 | Discharge: 2021-05-24 | Disposition: A | Discharge status: Home / Self Care | Payer: Medicaid Other | Attending: Emergency Medicine | Admitting: Emergency Medicine

## 2021-05-23 ENCOUNTER — Ambulatory Visit (INDEPENDENT_AMBULATORY_CARE_PROVIDER_SITE_OTHER): Payer: Medicaid Other

## 2021-05-23 ENCOUNTER — Encounter (HOSPITAL_COMMUNITY): Payer: Self-pay

## 2021-05-23 ENCOUNTER — Encounter (HOSPITAL_COMMUNITY): Payer: Self-pay | Admitting: Emergency Medicine

## 2021-05-23 ENCOUNTER — Other Ambulatory Visit: Payer: Self-pay

## 2021-05-23 ENCOUNTER — Emergency Department (HOSPITAL_COMMUNITY): Payer: Medicaid Other

## 2021-05-23 DIAGNOSIS — Z87891 Personal history of nicotine dependence: Secondary | ICD-10-CM | POA: Diagnosis not present

## 2021-05-23 DIAGNOSIS — E119 Type 2 diabetes mellitus without complications: Secondary | ICD-10-CM

## 2021-05-23 DIAGNOSIS — J441 Chronic obstructive pulmonary disease with (acute) exacerbation: Secondary | ICD-10-CM | POA: Diagnosis not present

## 2021-05-23 DIAGNOSIS — D509 Iron deficiency anemia, unspecified: Secondary | ICD-10-CM | POA: Insufficient documentation

## 2021-05-23 DIAGNOSIS — J45901 Unspecified asthma with (acute) exacerbation: Secondary | ICD-10-CM | POA: Diagnosis not present

## 2021-05-23 DIAGNOSIS — Z7902 Long term (current) use of antithrombotics/antiplatelets: Secondary | ICD-10-CM | POA: Insufficient documentation

## 2021-05-23 DIAGNOSIS — J9601 Acute respiratory failure with hypoxia: Secondary | ICD-10-CM | POA: Diagnosis not present

## 2021-05-23 DIAGNOSIS — J4541 Moderate persistent asthma with (acute) exacerbation: Secondary | ICD-10-CM | POA: Diagnosis not present

## 2021-05-23 DIAGNOSIS — Z7982 Long term (current) use of aspirin: Secondary | ICD-10-CM | POA: Insufficient documentation

## 2021-05-23 DIAGNOSIS — I152 Hypertension secondary to endocrine disorders: Secondary | ICD-10-CM | POA: Diagnosis present

## 2021-05-23 DIAGNOSIS — Z8673 Personal history of transient ischemic attack (TIA), and cerebral infarction without residual deficits: Secondary | ICD-10-CM | POA: Diagnosis not present

## 2021-05-23 DIAGNOSIS — R0602 Shortness of breath: Secondary | ICD-10-CM

## 2021-05-23 DIAGNOSIS — G8929 Other chronic pain: Secondary | ICD-10-CM | POA: Diagnosis not present

## 2021-05-23 DIAGNOSIS — R079 Chest pain, unspecified: Secondary | ICD-10-CM | POA: Diagnosis not present

## 2021-05-23 DIAGNOSIS — Z79899 Other long term (current) drug therapy: Secondary | ICD-10-CM | POA: Diagnosis not present

## 2021-05-23 DIAGNOSIS — Z20822 Contact with and (suspected) exposure to covid-19: Secondary | ICD-10-CM | POA: Insufficient documentation

## 2021-05-23 DIAGNOSIS — Z7984 Long term (current) use of oral hypoglycemic drugs: Secondary | ICD-10-CM | POA: Diagnosis not present

## 2021-05-23 DIAGNOSIS — I160 Hypertensive urgency: Secondary | ICD-10-CM | POA: Diagnosis not present

## 2021-05-23 DIAGNOSIS — F17200 Nicotine dependence, unspecified, uncomplicated: Secondary | ICD-10-CM | POA: Diagnosis present

## 2021-05-23 DIAGNOSIS — R0603 Acute respiratory distress: Secondary | ICD-10-CM

## 2021-05-23 DIAGNOSIS — I10 Essential (primary) hypertension: Secondary | ICD-10-CM | POA: Diagnosis present

## 2021-05-23 LAB — BLOOD GAS, VENOUS
Acid-Base Excess: 5.1 mmol/L — ABNORMAL HIGH (ref 0.0–2.0)
Bicarbonate: 31.5 mmol/L — ABNORMAL HIGH (ref 20.0–28.0)
O2 Saturation: 90.4 %
Patient temperature: 37
pCO2, Ven: 52 mmHg (ref 44–60)
pH, Ven: 7.39 (ref 7.25–7.43)
pO2, Ven: 57 mmHg — ABNORMAL HIGH (ref 32–45)

## 2021-05-23 LAB — CBC WITH DIFFERENTIAL/PLATELET
Abs Immature Granulocytes: 0.02 10*3/uL (ref 0.00–0.07)
Basophils Absolute: 0.1 10*3/uL (ref 0.0–0.1)
Basophils Relative: 1 %
Eosinophils Absolute: 0.4 10*3/uL (ref 0.0–0.5)
Eosinophils Relative: 5 %
HCT: 45.6 % (ref 39.0–52.0)
Hemoglobin: 14.7 g/dL (ref 13.0–17.0)
Immature Granulocytes: 0 %
Lymphocytes Relative: 46 %
Lymphs Abs: 3.9 10*3/uL (ref 0.7–4.0)
MCH: 28.9 pg (ref 26.0–34.0)
MCHC: 32.2 g/dL (ref 30.0–36.0)
MCV: 89.6 fL (ref 80.0–100.0)
Monocytes Absolute: 0.8 10*3/uL (ref 0.1–1.0)
Monocytes Relative: 9 %
Neutro Abs: 3.2 10*3/uL (ref 1.7–7.7)
Neutrophils Relative %: 39 %
Platelets: 326 10*3/uL (ref 150–400)
RBC: 5.09 MIL/uL (ref 4.22–5.81)
RDW: 13.6 % (ref 11.5–15.5)
WBC: 8.3 10*3/uL (ref 4.0–10.5)
nRBC: 0 % (ref 0.0–0.2)

## 2021-05-23 LAB — D-DIMER, QUANTITATIVE: D-Dimer, Quant: 0.32 ug/mL-FEU (ref 0.00–0.50)

## 2021-05-23 LAB — BASIC METABOLIC PANEL
Anion gap: 5 (ref 5–15)
BUN: 20 mg/dL (ref 8–23)
CO2: 29 mmol/L (ref 22–32)
Calcium: 9.3 mg/dL (ref 8.9–10.3)
Chloride: 106 mmol/L (ref 98–111)
Creatinine, Ser: 0.75 mg/dL (ref 0.61–1.24)
GFR, Estimated: >60 mL/min (ref >60)
Glucose, Bld: 100 mg/dL — ABNORMAL HIGH (ref 70–99)
Potassium: 4.1 mmol/L (ref 3.5–5.1)
Sodium: 140 mmol/L (ref 135–145)

## 2021-05-23 LAB — RESP PANEL BY RT-PCR (FLU A&B, COVID) ARPGX2
Influenza A by PCR: NEGATIVE
Influenza B by PCR: NEGATIVE
SARS Coronavirus 2 by RT PCR: NEGATIVE

## 2021-05-23 LAB — TROPONIN I (HIGH SENSITIVITY)
Troponin I (High Sensitivity): 7 ng/L (ref <18)
Troponin I (High Sensitivity): 6 ng/L (ref <18)

## 2021-05-23 LAB — BRAIN NATRIURETIC PEPTIDE: B Natriuretic Peptide: 18.8 pg/mL (ref 0.0–100.0)

## 2021-05-23 LAB — CBG MONITORING, ED: Glucose-Capillary: 248 mg/dL — ABNORMAL HIGH (ref 70–99)

## 2021-05-23 MED ORDER — TAMSULOSIN HCL 0.4 MG PO CAPS: 0.4000 mg | ORAL_CAPSULE | Freq: Every day | ORAL | Status: DC | PRN

## 2021-05-23 MED ORDER — AMLODIPINE BESYLATE 10 MG PO TABS
10.0000 mg | ORAL_TABLET | Freq: Every day | ORAL | Status: DC
  Administered 2021-05-24: 10 mg via ORAL
  Filled 2021-05-23: qty 1

## 2021-05-23 MED ORDER — OXYCODONE HCL ER 10 MG PO T12A
10.0000 mg | EXTENDED_RELEASE_TABLET | Freq: Two times a day (BID) | ORAL | Status: DC
  Administered 2021-05-24 (×2): 10 mg via ORAL
  Filled 2021-05-23 (×2): qty 1

## 2021-05-23 MED ORDER — SODIUM CHLORIDE 0.9 % IV SOLN
500.0000 mg | INTRAVENOUS | Status: AC
  Administered 2021-05-24: 500 mg via INTRAVENOUS
  Filled 2021-05-23: qty 5

## 2021-05-23 MED ORDER — CLOPIDOGREL BISULFATE 75 MG PO TABS
75.0000 mg | ORAL_TABLET | Freq: Every day | ORAL | Status: DC
  Administered 2021-05-24: 75 mg via ORAL
  Filled 2021-05-23: qty 1

## 2021-05-23 MED ORDER — INSULIN ASPART 100 UNIT/ML IJ SOLN
0.0000 [IU] | Freq: Every day | INTRAMUSCULAR | Status: DC
  Filled 2021-05-23: qty 0.05

## 2021-05-23 MED ORDER — DULOXETINE HCL 60 MG PO CPEP
60.0000 mg | ORAL_CAPSULE | Freq: Every day | ORAL | Status: DC
  Filled 2021-05-23: qty 1

## 2021-05-23 MED ORDER — VITAMIN B-12 1000 MCG PO TABS
1000.0000 ug | ORAL_TABLET | Freq: Every day | ORAL | Status: DC
  Administered 2021-05-24: 1000 ug via ORAL
  Filled 2021-05-23: qty 1

## 2021-05-23 MED ORDER — VITAMIN D (ERGOCALCIFEROL) 1.25 MG (50000 UNIT) PO CAPS
50000.0000 [IU] | ORAL_CAPSULE | ORAL | Status: DC
  Administered 2021-05-24: 50000 [IU] via ORAL
  Filled 2021-05-23: qty 1

## 2021-05-23 MED ORDER — MAGNESIUM SULFATE 2 GM/50ML IV SOLN
2.0000 g | Freq: Once | INTRAVENOUS | Status: AC
  Administered 2021-05-23: 2 g via INTRAVENOUS
  Filled 2021-05-23: qty 50

## 2021-05-23 MED ORDER — ALBUTEROL SULFATE (2.5 MG/3ML) 0.083% IN NEBU
10.0000 mg | INHALATION_SOLUTION | Freq: Once | RESPIRATORY_TRACT | Status: AC
  Administered 2021-05-23: 10 mg via RESPIRATORY_TRACT

## 2021-05-23 MED ORDER — SODIUM CHLORIDE 0.9 % IV SOLN: INTRAVENOUS | Status: DC

## 2021-05-23 MED ORDER — ALBUTEROL SULFATE (2.5 MG/3ML) 0.083% IN NEBU: 2.5000 mg | INHALATION_SOLUTION | RESPIRATORY_TRACT | Status: DC | PRN

## 2021-05-23 MED ORDER — GABAPENTIN 300 MG PO CAPS: 300.0000 mg | ORAL_CAPSULE | Freq: Three times a day (TID) | ORAL | Status: DC | PRN

## 2021-05-23 MED ORDER — DICLOFENAC SODIUM 1 % EX GEL: 2.0000 g | Freq: Every day | CUTANEOUS | Status: DC | PRN

## 2021-05-23 MED ORDER — TIOTROPIUM BROMIDE MONOHYDRATE 2.5 MCG/ACT IN AERS: 1.0000 | INHALATION_SPRAY | Freq: Two times a day (BID) | RESPIRATORY_TRACT | Status: DC

## 2021-05-23 MED ORDER — HYDROCHLOROTHIAZIDE 12.5 MG PO TABS: 12.5000 mg | ORAL_TABLET | Freq: Every day | ORAL | Status: DC | PRN

## 2021-05-23 MED ORDER — AZITHROMYCIN 250 MG PO TABS: 500.0000 mg | ORAL_TABLET | Freq: Every day | ORAL | Status: DC

## 2021-05-23 MED ORDER — ASPIRIN EC 81 MG PO TBEC
81.0000 mg | DELAYED_RELEASE_TABLET | Freq: Every day | ORAL | Status: DC
  Administered 2021-05-24: 81 mg via ORAL
  Filled 2021-05-23: qty 1

## 2021-05-23 MED ORDER — MONTELUKAST SODIUM 10 MG PO TABS
10.0000 mg | ORAL_TABLET | Freq: Every day | ORAL | Status: DC
  Administered 2021-05-24: 10 mg via ORAL
  Filled 2021-05-23: qty 1

## 2021-05-23 MED ORDER — OMEGA-3-ACID ETHYL ESTERS 1 G PO CAPS
1.0000 g | ORAL_CAPSULE | Freq: Every day | ORAL | Status: DC
  Administered 2021-05-24: 1 g via ORAL
  Filled 2021-05-23: qty 1

## 2021-05-23 MED ORDER — ATORVASTATIN CALCIUM 40 MG PO TABS
40.0000 mg | ORAL_TABLET | Freq: Every day | ORAL | Status: DC
  Administered 2021-05-24: 40 mg via ORAL
  Filled 2021-05-23: qty 1

## 2021-05-23 MED ORDER — FISH OIL TRIPLE STRENGTH 1360 MG PO CAPS
1000.0000 mg | ORAL_CAPSULE | Freq: Every day | ORAL | Status: DC
Start: 2021-05-24 — End: 2021-05-23

## 2021-05-23 MED ORDER — PREDNISONE 20 MG PO TABS
40.0000 mg | ORAL_TABLET | Freq: Every day | ORAL | Status: DC
  Filled 2021-05-23: qty 2

## 2021-05-23 MED ORDER — ASCORBIC ACID 500 MG PO TABS
500.0000 mg | ORAL_TABLET | Freq: Every day | ORAL | Status: DC
  Administered 2021-05-24: 500 mg via ORAL
  Filled 2021-05-23: qty 1

## 2021-05-23 MED ORDER — PANTOPRAZOLE SODIUM 40 MG PO TBEC
40.0000 mg | DELAYED_RELEASE_TABLET | Freq: Every day | ORAL | Status: DC
  Administered 2021-05-24: 40 mg via ORAL
  Filled 2021-05-23: qty 1

## 2021-05-23 MED ORDER — METFORMIN HCL 500 MG PO TABS
500.0000 mg | ORAL_TABLET | Freq: Every day | ORAL | Status: DC
  Administered 2021-05-24: 500 mg via ORAL
  Filled 2021-05-23: qty 1

## 2021-05-23 MED ORDER — FERROUS SULFATE 325 (65 FE) MG PO TABS
325.0000 mg | ORAL_TABLET | Freq: Every day | ORAL | Status: DC
  Administered 2021-05-24: 325 mg via ORAL
  Filled 2021-05-23: qty 1

## 2021-05-23 MED ORDER — ALBUTEROL SULFATE HFA 108 (90 BASE) MCG/ACT IN AERS: 2.0000 | INHALATION_SPRAY | RESPIRATORY_TRACT | Status: DC | PRN

## 2021-05-23 MED ORDER — HYDROXYZINE HCL 25 MG PO TABS
50.0000 mg | ORAL_TABLET | Freq: Every day | ORAL | Status: DC
  Administered 2021-05-24: 50 mg via ORAL
  Filled 2021-05-23: qty 2

## 2021-05-23 MED ORDER — ALBUTEROL SULFATE (2.5 MG/3ML) 0.083% IN NEBU: 2.5000 mg | INHALATION_SOLUTION | Freq: Four times a day (QID) | RESPIRATORY_TRACT | Status: DC | PRN

## 2021-05-23 MED ORDER — ENOXAPARIN SODIUM 40 MG/0.4ML IJ SOSY
40.0000 mg | PREFILLED_SYRINGE | INTRAMUSCULAR | Status: DC
  Filled 2021-05-23: qty 0.4

## 2021-05-23 MED ORDER — MOMETASONE FURO-FORMOTEROL FUM 200-5 MCG/ACT IN AERO
2.0000 | INHALATION_SPRAY | Freq: Two times a day (BID) | RESPIRATORY_TRACT | Status: DC
  Administered 2021-05-24: 2 via RESPIRATORY_TRACT
  Filled 2021-05-23: qty 8.8

## 2021-05-23 MED ORDER — UMECLIDINIUM BROMIDE 62.5 MCG/ACT IN AEPB
1.0000 | INHALATION_SPRAY | Freq: Every day | RESPIRATORY_TRACT | Status: DC
  Administered 2021-05-24: 1 via RESPIRATORY_TRACT
  Filled 2021-05-23: qty 7

## 2021-05-23 MED ORDER — OXYCODONE HCL 5 MG PO TABS
20.0000 mg | ORAL_TABLET | Freq: Four times a day (QID) | ORAL | Status: DC | PRN
  Administered 2021-05-24: 20 mg via ORAL
  Filled 2021-05-23: qty 4

## 2021-05-23 MED ORDER — NITROGLYCERIN 0.4 MG SL SUBL: 0.4000 mg | SUBLINGUAL_TABLET | SUBLINGUAL | Status: DC | PRN

## 2021-05-23 MED ORDER — LORATADINE 10 MG PO TABS
10.0000 mg | ORAL_TABLET | Freq: Every day | ORAL | Status: DC
Start: 2021-05-24 — End: 2021-05-24
  Administered 2021-05-24: 10 mg via ORAL
  Filled 2021-05-23: qty 1

## 2021-05-23 MED ORDER — IPRATROPIUM BROMIDE 0.02 % IN SOLN
0.5000 mg | Freq: Once | RESPIRATORY_TRACT | Status: AC
  Administered 2021-05-23: 0.5 mg via RESPIRATORY_TRACT
  Filled 2021-05-23: qty 2.5

## 2021-05-23 MED ORDER — IPRATROPIUM-ALBUTEROL 0.5-2.5 (3) MG/3ML IN SOLN
3.0000 mL | Freq: Four times a day (QID) | RESPIRATORY_TRACT | Status: DC
  Administered 2021-05-24 (×2): 3 mL via RESPIRATORY_TRACT
  Filled 2021-05-23 (×3): qty 3

## 2021-05-23 MED ORDER — METHYLPREDNISOLONE SODIUM SUCC 125 MG IJ SOLR
125.0000 mg | Freq: Once | INTRAMUSCULAR | Status: AC
  Administered 2021-05-23: 125 mg via INTRAVENOUS
  Filled 2021-05-23: qty 2

## 2021-05-23 MED ORDER — ALBUTEROL (5 MG/ML) CONTINUOUS INHALATION SOLN: 10.0000 mg/h | INHALATION_SOLUTION | Freq: Once | RESPIRATORY_TRACT | Status: DC

## 2021-05-23 MED ORDER — INSULIN ASPART 100 UNIT/ML IJ SOLN
0.0000 [IU] | Freq: Three times a day (TID) | INTRAMUSCULAR | Status: DC
  Filled 2021-05-23: qty 0.2

## 2021-05-23 MED ORDER — METHYLPREDNISOLONE SODIUM SUCC 40 MG IJ SOLR
40.0000 mg | Freq: Two times a day (BID) | INTRAMUSCULAR | Status: AC
  Administered 2021-05-24 (×2): 40 mg via INTRAVENOUS
  Filled 2021-05-23 (×2): qty 1

## 2021-05-23 MED ORDER — HYDROXYZINE PAMOATE 50 MG PO CAPS: 50.0000 mg | ORAL_CAPSULE | Freq: Every day | ORAL | Status: DC

## 2021-05-23 MED ORDER — LUBIPROSTONE 24 MCG PO CAPS
24.0000 ug | ORAL_CAPSULE | Freq: Every day | ORAL | Status: DC | PRN
  Filled 2021-05-23: qty 1

## 2021-05-23 NOTE — H&P (Signed)
?History and Physical  ? ? ?Patient: Glenn [name]:[id number] DOB: 23-Aug-1960 ?DOA: 05/23/2021 ?DOS: the patient was seen and examined on 05/23/2021 ?PCP: Fleet Contras, MD  ?Patient coming from: Home ? ?Chief Complaint:  ?Chief Complaint  ?Patient presents with  ? Shortness of Breath  ? ?HPI: Glenn [name] is a 61 y.o. male with medical history significant of Asthma, COPD, previous CVA: Anxiety disorder, chronic pain syndrome, tobacco abuse, sarcoidosis, essential hypertension and hyperlipidemia who presents to the ER with complaint of chest pain that has been going on for about 2 weeks.  He apparently took his home regimen but no response from the pain.  He also noticed some shortness of breath currently..  Patient denies fever or chills.  Denied any nausea vomiting.  He was seen and evaluated in the ER.  Patient appears to be wheezing and appears to be in acute exacerbation of his COPD and asthma.  He is being admitted therefore was acute exacerbation of COPD and asthma.  He has received breathing treatment in the ER and appears to be doing better ? ?Review of Systems: As mentioned in the history of present illness. All other systems reviewed and are negative. ?Past Medical History:  ?Diagnosis Date  ? Anxiety   ? Arthritis   ? Asthma   ? Chronic pain 05/06/2020  ? COPD (chronic obstructive pulmonary disease) ([facility])   ? CVA (cerebral vascular accident) [facility])   ? 2010; 06/2020  ? Depression   ? Diabetes mellitus type 2 in nonobese [facility])   ? Dyspnea   ? ED (erectile dysfunction)   ? Hyperlipidemia   ? Hypertension   ? Sarcoid   ? Tobacco dependence 05/06/2020  ? ?Past Surgical History:  ?Procedure Laterality Date  ? LUNG SURGERY    ? removed tissue  ? ?Social History:  reports that he quit smoking about a year ago. His smoking use included cigarettes. He has a 17.00 pack-year smoking history. He has never used smokeless tobacco. He reports that he does not drink alcohol and does not use drugs. ? ?Allergies   ?Allergen Reactions  ? Insulins Swelling  ? Ibuprofen   ?  Abdominal sensitivity  ? Shrimp [Shellfish Allergy] Itching  ? Tylenol [Acetaminophen]   ?  Abdominal sensitvity  ? Lisinopril Swelling and Rash  ?  Tongue swelling  ? ? ?Family History  ?Problem Relation Age of Onset  ? Hypertension Mother   ? Diabetes Maternal Grandmother   ? Colon cancer Neg Hx   ? Esophageal cancer Neg Hx   ? Rectal cancer Neg Hx   ? Inflammatory bowel disease Neg Hx   ? Liver disease Neg Hx   ? Pancreatic cancer Neg Hx   ? ? ?Prior to Admission medications   ?Medication Sig Start Date End Date Taking? Authorizing Provider  ?albuterol (PROAIR HFA) 108 (90 Base) MCG/ACT inhaler 2 puffs every 4 hours as needed only  if your can't catch your breath ?Patient taking differently: 2 puffs in the morning, at noon, in the evening, and at bedtime. 11/11/20  Yes [name], MD  ?albuterol (PROVENTIL) (2.5 MG/3ML) 0.083% nebulizer solution Take 3 mLs (2.5 mg total) by nebulization every 6 (six) hours as needed for wheezing or shortness of breath. 05/12/21  Yes Luciano Cutter, MD  ?AMITIZA 24 MCG capsule Take 24 mcg by mouth daily as needed for constipation. 03/31/20  Yes [provider]  ?amLODipine (NORVASC) 10 MG tablet Take 10 mg by mouth daily.  Yes [provider]  ?Ascorbic Acid (VITAMIN C PO) Take 2 tablets by mouth daily.   Yes [provider]  ?aspirin EC 81 MG tablet Take 81 mg by mouth daily. Swallow whole.   Yes [provider]  ?atorvastatin (LIPITOR) 40 MG tablet Take 1 tablet (40 mg total) by mouth daily. 06/26/20  Yes Pokhrel, Laxman, MD  ?budesonide-formoterol (SYMBICORT) 160-4.5 MCG/ACT inhaler Inhale 2 puffs into the lungs 2 (two) times daily. 05/12/21  Yes Luciano Cutter, MD  ?cetirizine (ZYRTEC ALLERGY) 10 MG tablet Take 1 tablet (10 mg total) by mouth at bedtime. ?Patient taking differently: Take 10 mg by mouth daily as needed for allergies. 06/09/20  Yes Parrett, Tammy S, NP   ?clopidogrel (PLAVIX) 75 MG tablet Take 1 tablet (75 mg total) by mouth daily. 06/26/20 06/26/21 Yes Pokhrel, Laxman, MD  ?diclofenac Sodium (VOLTAREN) 1 % GEL Apply 1 application. topically daily as needed for pain. 02/09/21  Yes [provider]  ?DULoxetine (CYMBALTA) 60 MG capsule Take 60 mg by mouth daily.   Yes [provider]  ?ferrous sulfate 325 (65 FE) MG EC tablet Take 325 mg by mouth daily with breakfast.   Yes [provider]  ?gabapentin (NEURONTIN) 300 MG capsule Take 1 capsule (300 mg total) by mouth 3 (three) times daily. ?Patient taking differently: Take 300 mg by mouth 3 (three) times daily as needed (siactic nerve). 02/09/18  Yes Eustace Moore, MD  ?hydrochlorothiazide (HYDRODIURIL) 12.5 MG tablet Take 12.5 mg by mouth daily as needed (fluid). 03/26/20  Yes [provider]  ?hydrOXYzine (VISTARIL) 50 MG capsule Take 50 mg by mouth at bedtime. 02/10/21  Yes [provider]  ?metFORMIN (GLUCOPHAGE) 500 MG tablet Take 500 mg by mouth daily with breakfast. 04/17/20  Yes [provider]  ?montelukast (SINGULAIR) 10 MG tablet TAKE 1 TABLET BY MOUTH EVERYDAY AT BEDTIME 05/11/21  Yes Parrett, Tammy S, NP  ?nitroGLYCERIN (NITROSTAT) 0.4 MG SL tablet Place 0.4 mg under the tongue every 5 (five) minutes as needed for chest pain.   Yes [provider]  ?Omega-3 Fatty Acids (FISH OIL TRIPLE STRENGTH PO) Take 1,000 mg by mouth daily.   Yes [provider]  ?Oxycodone HCl 20 MG TABS Take 20 mg by mouth in the morning, at noon, in the evening, and at bedtime. 03/31/20  Yes [provider]  ?pantoprazole (PROTONIX) 40 MG tablet Take 1 tablet (40 mg total) by mouth daily. 07/02/19  Yes Elgergawy, Leana Roe, MD  ?sildenafil (VIAGRA) 100 MG tablet Take 1 tablet (100 mg total) by mouth daily as needed. ?Patient taking differently: Take 100 mg by mouth daily as needed for erectile dysfunction. 11/17/20  Yes   ?tamsulosin (FLOMAX) 0.4 MG CAPS  capsule Take 1 capsule by mouth daily as needed (prostate). 04/26/21  Yes [provider]  ?Tiotropium Bromide Monohydrate (SPIRIVA RESPIMAT) 2.5 MCG/ACT AERS Inhale 1 puff into the lungs every 12 (twelve) hours. 05/12/21  Yes Luciano Cutter, MD  ?Vitamin D, Ergocalciferol, (DRISDOL) 1.25 MG (50000 UNIT) CAPS capsule Take 50,000 Units by mouth once a week. Mondays 05/02/19  Yes [provider]  ?XTAMPZA ER 9 MG C12A Take 1 capsule by mouth 2 (two) times daily. 04/09/21  Yes [provider]  ?ACCU-CHEK GUIDE test strip TEST BLOOD GLUCOSE DAILY 04/14/21   [provider]  ?Accu-Chek Softclix Lancets lancets SMARTSIG:Topical Once a Month 04/18/21   [provider]  ?azithromycin (ZITHROMAX) 250 MG tablet Take two tablets on  day 1 and one tablet daily on day 2-5 ?Patient not taking: Reported on 05/23/2021 05/12/21   Luciano CutterEllison, Chi Jane, MD  ?vitamin B-12 1000 MCG tablet Take 1 tablet (1,000 mcg total) by mouth daily. ?Patient not taking: Reported on 05/23/2021 05/10/20   Briant CedarEzenduka, Nkeiruka J, MD  ? ? ?Physical Exam: ?Vitals:  ? 05/23/21 1830 05/23/21 1900 05/23/21 1945 05/23/21 2100  ?BP: (!) 153/106 (!) 149/107 (!) 152/102 (!) 177/111  ?Pulse: (!) 105 (!) 107 (!) 107 (!) 107  ?Resp: (!) 26 20 (!) 25 (!) 22  ?Temp:      ?TempSrc:      ?SpO2: 91% 91% 92% 93%  ? ?General: Stable, no acute distress ?HEENT: EOMI, no pallor no jaundice ?Neck: Supple, no JVD or lymphadenopathy ?Respiratory: Decreased air entry bilaterally with some expiratory wheezing ?Cardiovascular system: Sinus tachycardia, no murmur ?Abdomen: Soft nontender with positive bowel sounds ?Extremities: No edema no cyanosis or clubbing. ? ?Data Reviewed: ? ?Glucose is 248, normal CBC, acute viral assay was negative.  Chest x-ray showed decreased lung volumes with developing right basilar groundglass opacities ? ?Assessment and Plan: ? ? ?#1 acute exacerbation of asthma/COPD: Patient is responding to initial steroids and breathing  treatments in the ER.  We will admit the patient for observation.  Continue with IV Solu-Medrol, antibiotic, breathing treatments, will proceed with transitioning to oral prednisone taper dose. ? ?#2 hypertensive urgency: [place]

## 2021-05-23 NOTE — ED Provider Notes (Signed)
?[facility]-EMERGENCY DEPT ?Provider Note ? ? ?CSN: [id number] ?Arrival date & time: 05/23/21  1555 ? ?  ? ?History ? ?Chief Complaint  ?Patient presents with  ? Shortness of Breath  ? ? ?Glenn [name] is a 61 y.o. male. ? ?HPI ? ?61 year old male with history of COPD, DM 2, HTN, presenting to the emergency department from urgent care with respiratory distress.  Patient states that he has been having a nonproductive cough and shortness of breath.  He states that his home nebulizer treatment is not working.  He presents in respiratory distress.  He denies any fevers or chills.  Symptoms of been ongoing and worsening for the last 2 weeks.  He is endorses associated chest tightness.  He was seen in urgent care earlier today and sent to the emergency department for respiratory distress.  On chart review, the patient was seen in pulmonology clinic on 4/5 and started on prednisone and azithromycin for a COPD exacerbation.  The patient states that he feels like that his home nebulizer is not functioning as it had previously. ? ?Home Medications ?Prior to Admission medications   ?Medication Sig Start Date End Date Taking? Authorizing Provider  ?albuterol (PROAIR HFA) 108 (90 Base) MCG/ACT inhaler 2 puffs every 4 hours as needed only  if your can't catch your breath ?Patient taking differently: 2 puffs in the morning, at noon, in the evening, and at bedtime. 11/11/20  Yes [name], MD  ?albuterol (PROVENTIL) (2.5 MG/3ML) 0.083% nebulizer solution Take 3 mLs (2.5 mg total) by nebulization every 6 (six) hours as needed for wheezing or shortness of breath. 05/12/21  Yes Luciano Cutter, MD  ?ACCU-CHEK GUIDE test strip TEST BLOOD GLUCOSE DAILY 04/14/21   [provider]  ?Accu-Chek Softclix Lancets lancets SMARTSIG:Topical Once a Month 04/18/21   [provider]  ?AMITIZA 24 MCG capsule Take 24 mcg by mouth daily as needed for constipation. 03/31/20   [provider]  ?amLODipine  (NORVASC) 10 MG tablet Take 10 mg by mouth daily.    [provider]  ?Ascorbic Acid (VITAMIN C PO) Take 2 tablets by mouth daily.    [provider]  ?aspirin EC 81 MG tablet Take 81 mg by mouth daily. Swallow whole.    [provider]  ?atorvastatin (LIPITOR) 40 MG tablet Take 1 tablet (40 mg total) by mouth daily. 06/26/20   Pokhrel, Rebekah Chesterfield, MD  ?azithromycin (ZITHROMAX) 250 MG tablet Take two tablets on day 1 and one tablet daily on day 2-5 05/12/21   Luciano Cutter, MD  ?budesonide-formoterol [facility] 160-4.5 MCG/ACT inhaler Inhale 2 puffs into the lungs 2 (two) times daily. 05/12/21   Luciano Cutter, MD  ?cetirizine (ZYRTEC ALLERGY) 10 MG tablet Take 1 tablet (10 mg total) by mouth at bedtime. ?Patient taking differently: Take 10 mg by mouth daily as needed for allergies. 06/09/20   Parrett, Virgel Bouquet, NP  ?clopidogrel (PLAVIX) 75 MG tablet Take 1 tablet (75 mg total) by mouth daily. 06/26/20 06/26/21  Pokhrel, Rebekah Chesterfield, MD  ?diclofenac Sodium (VOLTAREN) 1 % GEL Apply 1 application. topically daily as needed for pain. 02/09/21   [provider]  ?ferrous sulfate 325 (65 FE) MG tablet Take 1 tablet (325 mg total) by mouth daily with breakfast. 05/10/20 04/29/21  Briant Cedar, MD  ?gabapentin (NEURONTIN) 300 MG capsule Take 1 capsule (300 mg total) by mouth 3 (three) times daily. ?Patient taking differently: Take 300 mg by mouth 3 (three) times  daily as needed (siactic nerve). 02/09/18   Eustace MooreNelson, Yvonne Sue, MD  ?hydrochlorothiazide (HYDRODIURIL) 12.5 MG tablet Take 12.5 mg by mouth daily as needed (fluid). 03/26/20   [provider]  ?hydrOXYzine (VISTARIL) 50 MG capsule Take 50 mg by mouth at bedtime as needed for anxiety. 02/10/21   [provider]  ?metFORMIN (GLUCOPHAGE) 500 MG tablet Take 500 mg by mouth daily as needed (if sugar level is high). 04/17/20   [provider]  ?montelukast (SINGULAIR) 10 MG tablet TAKE 1 TABLET BY MOUTH EVERYDAY AT BEDTIME  05/11/21   Parrett, Tammy S, NP  ?naphazoline-pheniramine (VISINE) 0.025-0.3 % ophthalmic solution Place 1 drop into both eyes 4 (four) times daily as needed for eye irritation.    [provider]  ?nitroGLYCERIN (NITROSTAT) 0.4 MG SL tablet Place 0.4 mg under the tongue every 5 (five) minutes as needed for chest pain.    [provider]  ?Omega-3 Fatty Acids (FISH OIL TRIPLE STRENGTH PO) Take 1,000 mg by mouth daily.    [provider]  ?Oxycodone HCl 20 MG TABS Take 20 mg by mouth in the morning, at noon, in the evening, and at bedtime. 03/31/20   [provider]  ?pantoprazole (PROTONIX) 40 MG tablet Take 1 tablet (40 mg total) by mouth daily. 07/02/19   Elgergawy, Leana Roeawood S, MD  ?predniSONE (DELTASONE) 10 MG tablet Take 10 mg by mouth daily as needed (Asthma). 04/14/21   [provider]  ?sildenafil (VIAGRA) 100 MG tablet Take 1 tablet (100 mg total) by mouth daily as needed. 11/17/20     ?tamsulosin (FLOMAX) 0.4 MG CAPS capsule Take 1 capsule by mouth every evening. 04/26/21   [provider]  ?Tiotropium Bromide Monohydrate (SPIRIVA RESPIMAT) 2.5 MCG/ACT AERS Inhale 1 puff into the lungs every 12 (twelve) hours. 05/12/21   Luciano CutterEllison, Chi Jane, MD  ?vitamin B-12 1000 MCG tablet Take 1 tablet (1,000 mcg total) by mouth daily. 05/10/20   Briant CedarEzenduka, Nkeiruka J, MD  ?Vitamin D, Ergocalciferol, (DRISDOL) 1.25 MG (50000 UNIT) CAPS capsule Take 50,000 Units by mouth once a week. Mondays 05/02/19   [provider]  ?XTAMPZA ER 9 MG C12A Take 1 capsule by mouth 2 (two) times daily. 04/09/21   [provider]  ?   ? ?Allergies    ?Insulins, Ibuprofen, Shrimp [shellfish allergy], Tylenol [acetaminophen], and Lisinopril   ? ?Review of Systems   ?Review of Systems  ?All other systems reviewed and are negative. ? ?Physical Exam ?Updated Vital Signs ?BP (!) 152/102   Pulse (!) 107   Temp 97.9 ?F (36.6 ?C) (Oral)   Resp (!) 25   SpO2 92%  ?Physical Exam ?Vitals and  nursing note reviewed.  ?Constitutional:   ?   General: He is not in acute distress. ?HENT:  ?   Head: Normocephalic and atraumatic.  ?Eyes:  ?   Conjunctiva/sclera: Conjunctivae normal.  ?   Pupils: Pupils are equal, round, and reactive to light.  ?Cardiovascular:  ?   Rate and Rhythm: Normal rate and regular rhythm.  ?Pulmonary:  ?   Effort: Tachypnea and respiratory distress present.  ?   Breath sounds: Examination of the right-upper field reveals wheezing. Examination of the left-upper field reveals wheezing. Examination of the right-middle field reveals wheezing. Examination of the left-middle field reveals wheezing. Examination of the right-lower field reveals wheezing. Examination of the left-lower field reveals wheezing. Wheezing present.  ?   Comments: Decreased air movement, expiratory wheezing present in all lung fields,  tachypnea and respiratory distress present ?Abdominal:  ?   General: There is no distension.  ?   Tenderness: There is no guarding.  ?Musculoskeletal:     ?   General: No deformity or signs of injury.  ?   Cervical back: Neck supple.  ?Skin: ?   Findings: No lesion or rash.  ?Neurological:  ?   General: No focal deficit present.  ?   Mental Status: He is alert. Mental status is at baseline.  ? ? ?ED Results / Procedures / Treatments   ?Labs ?(all labs ordered are listed, but only abnormal results are displayed) ?Labs Reviewed  ?BASIC METABOLIC PANEL - Abnormal; Notable for the following components:  ?    Result Value  ? Glucose, Bld 100 (*)   ? All other components within normal limits  ?BLOOD GAS, VENOUS - Abnormal; Notable for the following components:  ? pO2, Ven 57 (*)   ? Bicarbonate 31.5 (*)   ? Acid-Base Excess 5.1 (*)   ? All other components within normal limits  ?RESP PANEL BY RT-PCR (FLU A&B, COVID) ARPGX2  ?CBC WITH DIFFERENTIAL/PLATELET  ?BRAIN NATRIURETIC PEPTIDE  ?D-DIMER, QUANTITATIVE  ?TROPONIN I (HIGH SENSITIVITY)  ?TROPONIN I (HIGH SENSITIVITY)  ? ? ?EKG ?EKG  Interpretation ? ?Date/Time:  Sunday May 23 2021 16:47:17 EDT ?Ventricular Rate:  97 ?PR Interval:  128 ?QRS Duration: 87 ?QT Interval:  344 ?QTC Calculation: 437 ?R Axis:   35 ?Text Interpretation: Sinus rhythm Abno

## 2021-05-23 NOTE — Discharge Instructions (Addendum)
Unfortunately, as we discussed I cannot rule out your symptoms coming from a heart issue.  You may also need a longer nebulizer treatment since your breathing treatments at home are not working.  Because of this I recommend you go directly to the emergency department from here.  Either [facility] or [facility] emergency department would be fine. ?

## 2021-05-23 NOTE — ED Provider Notes (Signed)
?[facility] ? ? ? ?CSN: [id number] ?Arrival date & time: 05/23/21  1152 ? ? ?  ? ?History   ?Chief Complaint ?Chief Complaint  ?Patient presents with  ? Shortness of Breath  ? Chest Pain  ? ? ?HPI ?Glenn [name] is a 61 y.o. male with history of COPD, DM 2, hypertension presents to urgent care today with complaints of shortness of breath and chest pain.  Patient reports over 1 week history of nonproductive cough, shortness of breath and intermittent chest pain.  Patient states he has been using his nebulizer machine up to 4 times a day with little to no relief of dyspnea but sometimes will relieve chest tightness.Marland Kitchen.  Describes episode of chest "tightness" this morning with subsequent numbness bilaterally to arms.  Denies any associated diaphoresis or palpitations.  Pain subsided without intervention.  Per chart review patient seen by pulmonology on 4/5 and put on prednisone and azithromycin for COPD flare.  Patient uncertain if symptoms got better after that treatment.  Reports he is taking his Spiriva and Symbicort as prescribed.  He was scheduled for a cardiac stress test last week due to previous complaints of chest pain however due to insurance issues it has been rescheduled twice.  He states he is due to to have testing done later this week.  Patient denies any recent fever or chills, headache, dizziness, abdominal pain, N/V/D. ? ? ?Past Medical History:  ?Diagnosis Date  ? Anxiety   ? Arthritis   ? Asthma   ? Chronic pain 05/06/2020  ? COPD (chronic obstructive pulmonary disease) (HCC)   ? CVA (cerebral vascular accident) [facility](HCC)   ? 2010; 06/2020  ? Depression   ? Diabetes mellitus type 2 in nonobese [facility](HCC)   ? Dyspnea   ? ED (erectile dysfunction)   ? Hyperlipidemia   ? Hypertension   ? Sarcoid   ? Tobacco dependence 05/06/2020  ? ? ?Patient Active Problem List  ? Diagnosis Date Noted  ? Acute CVA (cerebrovascular accident) (HCC) 06/23/2020  ? Daytime sleepiness 06/09/2020  ? Malnutrition of moderate  degree 05/07/2020  ? Chronic pain 05/06/2020  ? Tobacco dependence 05/06/2020  ? Hypertension   ? Diabetes mellitus type 2 in nonobese [facility](HCC)   ? COPD (chronic obstructive pulmonary disease) (HCC) 04/11/2020  ? COPD with acute exacerbation (HCC) 06/30/2019  ? Near syncope 06/30/2019  ? Pleural nodule 06/30/2019  ? Colon cancer screening 12/18/2017  ? History of colonic polyps 12/18/2017  ? 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 COPD GOLD  II 10/05/2016  ? ? ?Past Surgical History:  ?Procedure Laterality Date  ? LUNG SURGERY    ? removed tissue  ? ? ? ? ?Home Medications   ? ?Prior to Admission medications   ?Medication Sig Start Date End Date Taking? Authorizing Provider  ?ACCU-CHEK GUIDE test strip TEST BLOOD GLUCOSE DAILY 04/14/21   [provider]  ?Accu-Chek Softclix Lancets lancets SMARTSIG:Topical Once a Month 04/18/21   [provider]  ?albuterol (PROAIR HFA) 108 (90 Base) MCG/ACT inhaler 2 puffs every 4 hours as needed only  if your can't catch your breath ?Patient taking differently: 2 puffs in the morning, at noon, in the evening, and at bedtime. 11/11/20   Nyoka CowdenWert, Michael B, MD  ?albuterol (PROVENTIL) (2.5 MG/3ML) 0.083% nebulizer solution Take 3 mLs (2.5 mg total) by nebulization every 6 (six) hours as needed for wheezing or shortness of breath. 05/12/21   Luciano CutterEllison, Chi Jane, MD  ?AMITIZA 24 MCG capsule Take 24  mcg by mouth daily as needed for constipation. 03/31/20   [provider]  ?amLODipine (NORVASC) 10 MG tablet Take 10 mg by mouth daily.    [provider]  ?Ascorbic Acid (VITAMIN C PO) Take 2 tablets by mouth daily.    [provider]  ?aspirin EC 81 MG tablet Take 81 mg by mouth daily. Swallow whole.    [provider]  ?atorvastatin (LIPITOR) 40 MG tablet Take 1 tablet (40 mg total) by mouth daily. 06/26/20   Pokhrel, Rebekah Chesterfield, MD  ?azithromycin (ZITHROMAX) 250 MG tablet Take two tablets on day 1 and one tablet daily on day 2-5 05/12/21   Luciano Cutter, MD   ?budesonide-formoterol [facility]) 160-4.5 MCG/ACT inhaler Inhale 2 puffs into the lungs 2 (two) times daily. 05/12/21   Luciano Cutter, MD  ?cetirizine (ZYRTEC ALLERGY) 10 MG tablet Take 1 tablet (10 mg total) by mouth at bedtime. ?Patient taking differently: Take 10 mg by mouth daily as needed for allergies. 06/09/20   Parrett, Virgel Bouquet, NP  ?clopidogrel (PLAVIX) 75 MG tablet Take 1 tablet (75 mg total) by mouth daily. 06/26/20 06/26/21  Pokhrel, Rebekah Chesterfield, MD  ?diclofenac Sodium (VOLTAREN) 1 % GEL Apply 1 application. topically daily as needed for pain. 02/09/21   [provider]  ?ferrous sulfate 325 (65 FE) MG tablet Take 1 tablet (325 mg total) by mouth daily with breakfast. 05/10/20 04/29/21  Briant Cedar, MD  ?gabapentin (NEURONTIN) 300 MG capsule Take 1 capsule (300 mg total) by mouth 3 (three) times daily. ?Patient taking differently: Take 300 mg by mouth 3 (three) times daily as needed (siactic nerve). 02/09/18   Eustace Moore, MD  ?hydrochlorothiazide (HYDRODIURIL) 12.5 MG tablet Take 12.5 mg by mouth daily as needed (fluid). 03/26/20   [provider]  ?hydrOXYzine (VISTARIL) 50 MG capsule Take 50 mg by mouth at bedtime as needed for anxiety. 02/10/21   [provider]  ?metFORMIN (GLUCOPHAGE) 500 MG tablet Take 500 mg by mouth daily as needed (if sugar level is high). 04/17/20   [provider]  ?montelukast (SINGULAIR) 10 MG tablet TAKE 1 TABLET BY MOUTH EVERYDAY AT BEDTIME 05/11/21   Parrett, Tammy S, NP  ?naphazoline-pheniramine (VISINE) 0.025-0.3 % ophthalmic solution Place 1 drop into both eyes 4 (four) times daily as needed for eye irritation.    [provider]  ?nitroGLYCERIN (NITROSTAT) 0.4 MG SL tablet Place 0.4 mg under the tongue every 5 (five) minutes as needed for chest pain.    [provider]  ?Omega-3 Fatty Acids (FISH OIL TRIPLE STRENGTH PO) Take 1,000 mg by mouth daily.    [provider]  ?Oxycodone HCl 20 MG TABS Take 20 mg  by mouth in the morning, at noon, in the evening, and at bedtime. 03/31/20   [provider]  ?pantoprazole (PROTONIX) 40 MG tablet Take 1 tablet (40 mg total) by mouth daily. 07/02/19   Elgergawy, Leana Roe, MD  ?predniSONE (DELTASONE) 10 MG tablet Take 10 mg by mouth daily as needed (Asthma). 04/14/21   [provider]  ?sildenafil (VIAGRA) 100 MG tablet Take 1 tablet (100 mg total) by mouth daily as needed. 11/17/20     ?tamsulosin (FLOMAX) 0.4 MG CAPS capsule Take 1 capsule by mouth every evening. 04/26/21   [provider]  ?Tiotropium Bromide Monohydrate (SPIRIVA RESPIMAT) 2.5 MCG/ACT AERS Inhale 1 puff into the lungs every 12 (twelve) hours. 05/12/21   Luciano Cutter, MD  ?vitamin B-12 1000 MCG tablet  Take 1 tablet (1,000 mcg total) by mouth daily. 05/10/20   Briant Cedar, MD  ?Vitamin D, Ergocalciferol, (DRISDOL) 1.25 MG (50000 UNIT) CAPS capsule Take 50,000 Units by mouth once a week. Mondays 05/02/19   [provider]  ?XTAMPZA ER 9 MG C12A Take 1 capsule by mouth 2 (two) times daily. 04/09/21   [provider]  ? ? ?Family History ?Family History  ?Problem Relation Age of Onset  ? Hypertension Mother   ? Diabetes Maternal Grandmother   ? Colon cancer Neg Hx   ? Esophageal cancer Neg Hx   ? Rectal cancer Neg Hx   ? Inflammatory bowel disease Neg Hx   ? Liver disease Neg Hx   ? Pancreatic cancer Neg Hx   ? ? ?Social History ?Social History  ? ?Tobacco Use  ? Smoking status: Former  ?  Packs/day: 0.50  ?  Years: 34.00  ?  Pack years: 17.00  ?  Types: Cigarettes  ?  Quit date: 05/11/2020  ?  Years since quitting: 1.0  ? Smokeless tobacco: Never  ? Tobacco comments:  ?  at the most smoked 6-7 cigarettes/day  ?Vaping Use  ? Vaping Use: Never used  ?Substance Use Topics  ? Alcohol use: Never  ? Drug use: Never  ? ? ? ?Allergies   ?Insulins, Ibuprofen, Shrimp [shellfish allergy], Tylenol [acetaminophen], and Lisinopril ? ? ?Review of Systems ?As stated in HPI otherwise  negative ? ? ?Physical Exam ?Triage Vital Signs ?ED Triage Vitals  ?Enc Vitals Group  ?   BP 05/23/21 1205 (!) 144/98  ?   Pulse Rate 05/23/21 1205 98  ?   Resp 05/23/21 1205 (!) 22  ?   Temp --   ?   Temp

## 2021-05-23 NOTE — ED Triage Notes (Signed)
Pt reports chest pain around 9 am and shortness of breath x 1 week. Described chest pain as tightness . Rating 5/10.  ?

## 2021-05-23 NOTE — ED Triage Notes (Signed)
Pt reports chest pain and progressively worsening SHOB x2 week. Pt reports his chest feels tight. Pt was sent here from [facility] for further evaluation.  ?

## 2021-05-23 NOTE — ED Notes (Signed)
Pt c/o dyspnea with exertion, which eases with rest. States was scheduled to have growth on chest removed, but anesthesiologist cancelled due to "heart issues, and using nebulizer way more than usual". Pt saw pulmonologist "couple wks ago" - was given prednisone and abx, but states did not get abx filled. ?Pt requesting new neb machine. ? ?

## 2021-05-24 ENCOUNTER — Other Ambulatory Visit (HOSPITAL_COMMUNITY): Payer: Self-pay

## 2021-05-24 DIAGNOSIS — J4541 Moderate persistent asthma with (acute) exacerbation: Secondary | ICD-10-CM | POA: Diagnosis not present

## 2021-05-24 DIAGNOSIS — E119 Type 2 diabetes mellitus without complications: Secondary | ICD-10-CM

## 2021-05-24 DIAGNOSIS — I10 Essential (primary) hypertension: Secondary | ICD-10-CM | POA: Diagnosis not present

## 2021-05-24 DIAGNOSIS — G8929 Other chronic pain: Secondary | ICD-10-CM | POA: Diagnosis not present

## 2021-05-24 LAB — COMPREHENSIVE METABOLIC PANEL
ALT: 17 U/L (ref 0–44)
AST: 16 U/L (ref 15–41)
Albumin: 3.5 g/dL (ref 3.5–5.0)
Alkaline Phosphatase: 53 U/L (ref 38–126)
Anion gap: 6 (ref 5–15)
BUN: 21 mg/dL (ref 8–23)
CO2: 27 mmol/L (ref 22–32)
Calcium: 8.7 mg/dL — ABNORMAL LOW (ref 8.9–10.3)
Chloride: 104 mmol/L (ref 98–111)
Creatinine, Ser: 0.68 mg/dL (ref 0.61–1.24)
GFR, Estimated: >60 mL/min (ref >60)
Glucose, Bld: 205 mg/dL — ABNORMAL HIGH (ref 70–99)
Potassium: 4.2 mmol/L (ref 3.5–5.1)
Sodium: 137 mmol/L (ref 135–145)
Total Bilirubin: 0.5 mg/dL (ref 0.3–1.2)
Total Protein: 6.4 g/dL — ABNORMAL LOW (ref 6.5–8.1)

## 2021-05-24 LAB — GLUCOSE, CAPILLARY
Glucose-Capillary: 170 mg/dL — ABNORMAL HIGH (ref 70–99)
Glucose-Capillary: 180 mg/dL — ABNORMAL HIGH (ref 70–99)

## 2021-05-24 LAB — CBC
HCT: 42.1 % (ref 39.0–52.0)
Hemoglobin: 13.4 g/dL (ref 13.0–17.0)
MCH: 28.3 pg (ref 26.0–34.0)
MCHC: 31.8 g/dL (ref 30.0–36.0)
MCV: 89 fL (ref 80.0–100.0)
Platelets: 305 10*3/uL (ref 150–400)
RBC: 4.73 MIL/uL (ref 4.22–5.81)
RDW: 13.5 % (ref 11.5–15.5)
WBC: 7.2 10*3/uL (ref 4.0–10.5)
nRBC: 0 % (ref 0.0–0.2)

## 2021-05-24 LAB — HIV ANTIBODY (ROUTINE TESTING W REFLEX): HIV Screen 4th Generation wRfx: NONREACTIVE

## 2021-05-24 MED ORDER — HYDRALAZINE HCL 20 MG/ML IJ SOLN: 10.0000 mg | INTRAMUSCULAR | Status: DC | PRN

## 2021-05-24 MED ORDER — METFORMIN HCL 500 MG PO TABS: 500.0000 mg | ORAL_TABLET | Freq: Every day | ORAL | Status: DC

## 2021-05-24 MED ORDER — AZITHROMYCIN 250 MG PO TABS: 250.0000 mg | ORAL_TABLET | Freq: Every day | ORAL | 0 refills | Status: AC

## 2021-05-24 MED ORDER — DM-GUAIFENESIN ER 30-600 MG PO TB12: 1.0000 | ORAL_TABLET | Freq: Two times a day (BID) | ORAL | Status: DC | PRN

## 2021-05-24 MED ORDER — METOPROLOL TARTRATE 5 MG/5ML IV SOLN: 5.0000 mg | INTRAVENOUS | Status: DC | PRN

## 2021-05-24 MED ORDER — SILDENAFIL CITRATE 100 MG PO TABS
100.0000 mg | ORAL_TABLET | Freq: Every day | ORAL | 5 refills | Status: DC | PRN
  Filled 2021-05-24: qty 10, 10d supply, fill #0
  Filled 2021-06-01: qty 30, 30d supply, fill #0
  Filled 2021-08-23: qty 30, 30d supply, fill #1
  Filled 2021-10-05: qty 30, 30d supply, fill #2
  Filled 2021-11-01 – 2021-11-19 (×2): qty 30, 30d supply, fill #3
  Filled 2021-12-31: qty 30, 30d supply, fill #4
  Filled 2022-01-28: qty 30, 30d supply, fill #5

## 2021-05-24 MED ORDER — SENNOSIDES-DOCUSATE SODIUM 8.6-50 MG PO TABS
1.0000 | ORAL_TABLET | Freq: Every evening | ORAL | Status: DC | PRN
Start: 2021-05-24 — End: 2021-05-24

## 2021-05-24 MED ORDER — PREDNISONE 20 MG PO TABS
40.0000 mg | ORAL_TABLET | Freq: Every day | ORAL | 0 refills | Status: AC
Start: 2021-05-25 — End: 2021-05-29

## 2021-05-24 NOTE — Progress Notes (Signed)
OT Cancellation Note ? ?Patient Details ?Name: Glenn [name] ?MRN: [id number] ?DOB: [birth year] ? ? ?Cancelled Treatment:    Reason Eval/Treat Not Completed: OT screened, no needs identified, will sign off ?Patient has been MI in room. Patient reported he does not need therapy at this time. OT signing off.  ?Chela Sutphen OTR/L, MS ?Acute Rehabilitation Department ?Office# [phone] ?Pager# [phone] ?05/24/2021, 11:46 AM ?

## 2021-05-24 NOTE — Progress Notes (Signed)
PT Cancellation Note ? ?Patient Details ?Name: Glenn [name] ?MRN: [id number] ?DOB: November 02, 1960 ? ? ?Cancelled Treatment:    Reason Eval/Treat Not Completed: PT screened, no needs identified, will sign off, to Dc home. ?Tresa Endo PT ?Acute Rehabilitation Services ?Pager [phone] ?Office [phone] ? ? ? ?Charlet Harr, Shella Maxim ?05/24/2021, 11:43 AM ?

## 2021-05-24 NOTE — Discharge Summary (Signed)
Physician Discharge Summary  ?Glenn [name]:[id number] DOB: 1960/12/27 DOA: 05/23/2021 ? ?PCP: Fleet Contras, MD ? ?Admit date: 05/23/2021 ?Discharge date: 05/24/2021 ? ?Admitted From: Home ?Disposition: Home ? ?Recommendations for Outpatient Follow-up:  ?Follow up with PCP in 1-2 weeks ?Please obtain BMP/CBC in one week your next doctors visit.  ?4 days of oral azithromycin and prednisone prescribed ?Follow-up outpatient pulmonary next 1-2 weeks ?Per previous records patient is only supposed to be on Plavix, not on aspirin anymore for his CVA.  Continue statin. ?Follow-up outpatient neurology ? ? ?Discharge Condition: Stable ?CODE STATUS: Full code ?Diet recommendation: Low-salt diet ? ?Brief/Interim Summary: ?61 year old with history of asthma, COPD, previous CVA, anxiety disorder, chronic pain, tobacco use, sarcoidosis, essential hypertension, HLD came to the ED with complaints of chest pain ongoing for [redacted] weeks along with some shortness of breath at the time of admission.  Upon admission he was diagnosed with COPD/asthma exacerbation.  Over the course of 24 hours patient's breathing symptoms significantly improved.  We will transition him into oral steroids and azithromycin.  Advised him to continue home bronchodilators.  After reviewing previous records he is no longer supposed to be on dual antiplatelet therapy.  He is only supposed to be taking Plavix and statin for his stroke back in May 2022. ?Today he is ambulating in the hallway without shortness of breath or hypoxia. ?  ?  ?Assessment & Plan: ? Principal Problem: ?  Asthma exacerbation ?Active Problems: ?  COPD with acute exacerbation (HCC) ?  Hypertension ?  Diabetes mellitus type 2 in nonobese [facility]) ?  Chronic pain ?  Tobacco dependence ?  ?  ?Acute exacerbation of asthma/COPD ?-Significantly improved.  We will transition him to oral prednisone and azithromycin to complete 4 more day course.  Recommend to continue using home bronchodilators. ?   ?Hypertensive urgency with history of essential hypertension ?-Blood pressure improved.  Continue his home regimen. ? 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  Per previous neurology record he is no longer supposed to be on aspirin. ?  ?Chronic pain syndrome ?-Resume his home medication ?  ?Diabetes mellitus type 2 ?-Continue patient on metformin.  Follow-up outpatient.  Tells me he is allergic to insulin and previously has caused him facial and tongue swelling. ?  ?Tobacco use ?- Cessation ordered. ?  ?GERD ?- PPI ? ?Iron deficiency anemia ?- On daily iron supplements ? ? ?Assessment and Plan: ?No notes have been filed under this hospital service. ?Service: Hospitalist ? ? ? ?  ?Body mass index is 24.18 kg/m?. ? ?  ? ? ? ?Discharge Diagnoses:  ?Principal Problem: ?  Asthma exacerbation ?Active Problems: ?  COPD with acute exacerbation (HCC) ?  Hypertension ?  Diabetes mellitus type 2 in nonobese [facility]) ?  Chronic pain ?  Tobacco dependence ? ? ? ? ? ?Consultations: ?None ? ?Subjective: ?Feels well he really wishes to go home today.  He is ambulating in the hallway without any shortness of breath or hypoxia. ? ?Discharge Exam: ?Vitals:  ? 05/24/21 0645 05/24/21 0738  ?BP: (!) 160/106   ?Pulse: (!) 110   ?Resp: 18   ?Temp: 97.8 ?F (36.6 ?C)   ?SpO2: 95% 96%  ? ?Vitals:  ? 05/24/21 0247 05/24/21 0322 05/24/21 0645 05/24/21 0738  ?BP:   (!) 160/106   ?Pulse:   (!) 110   ?Resp:   18   ?Temp:   97.8 ?F (36.6 ?C)   ?TempSrc:   Oral   ?SpO2:  97% 95% 96%  ?Weight: 65.9 kg     ?Height: 5\' 5"  (1.651 m)     ? ? ?General: Pt is alert, awake, not in acute distress ?Cardiovascular: RRR, S1/S2 +, no rubs, no gallops ?Respiratory: CTA bilaterally, no wheezing, no rhonchi ?Abdominal: Soft, NT, ND, bowel sounds + ?Extremities: no edema, no cyanosis ? ?Discharge Instructions ? ? ?Allergies as of 05/24/2021   ? ?   Reactions  ? Insulins Swelling  ? 04/2020. Do not use Novolog insulin  ? Ibuprofen   ? Abdominal sensitivity  ?  Shrimp [shellfish Allergy] Itching  ? Tylenol [acetaminophen]   ? Abdominal sensitvity  ? Lisinopril Swelling, Rash  ? Tongue swelling  ? ?  ? ?  ?Medication List  ?  ? ?STOP taking these medications   ? ?aspirin EC 81 MG tablet ?  ? ?  ? ?TAKE these medications   ? ?Accu-Chek Guide test strip ?Generic drug: glucose blood ?TEST BLOOD GLUCOSE DAILY ?  ?Accu-Chek Softclix Lancets lancets ?SMARTSIG:Topical Once a Month ?  ?albuterol 108 (90 Base) MCG/ACT inhaler ?Commonly known as: ProAir HFA ?2 puffs every 4 hours as needed only  if your can't catch your breath ?What changed:  ?how much to take ?when to take this ?additional instructions ?  ?albuterol (2.5 MG/3ML) 0.083% nebulizer solution ?Commonly known as: PROVENTIL ?Take 3 mLs (2.5 mg total) by nebulization every 6 (six) hours as needed for wheezing or shortness of breath. ?What changed: Another medication with the same name was changed. Make sure you understand how and when to take each. ?  ?Amitiza 24 MCG capsule ?Generic drug: lubiprostone ?Take 24 mcg by mouth daily as needed for constipation. ?  ?amLODipine 10 MG tablet ?Commonly known as: NORVASC ?Take 10 mg by mouth daily. ?  ?atorvastatin 40 MG tablet ?Commonly known as: LIPITOR ?Take 1 tablet (40 mg total) by mouth daily. ?  ?azithromycin 250 MG tablet ?Commonly known as: ZITHROMAX ?Take 1 tablet (250 mg total) by mouth daily for 4 days. ?What changed:  ?how much to take ?how to take this ?when to take this ?additional instructions ?  ?budesonide-formoterol 160-4.5 MCG/ACT inhaler ?Commonly known as: Symbicort ?Inhale 2 puffs into the lungs 2 (two) times daily. ?  ?cetirizine 10 MG tablet ?Commonly known as: ZyrTEC Allergy ?Take 1 tablet (10 mg total) by mouth at bedtime. ?What changed:  ?when to take this ?reasons to take this ?  ?clopidogrel 75 MG tablet ?Commonly known as: PLAVIX ?Take 1 tablet (75 mg total) by mouth daily. ?  ?cyanocobalamin 1000 MCG tablet ?Take 1 tablet (1,000 mcg total) by mouth  daily. ?  ?diclofenac Sodium 1 % Gel ?Commonly known as: VOLTAREN ?Apply 1 application. topically daily as needed for pain. ?  ?DULoxetine 60 MG capsule ?Commonly known as: CYMBALTA ?Take 60 mg by mouth daily. ?  ?ferrous sulfate 325 (65 FE) MG EC tablet ?Take 325 mg by mouth daily with breakfast. ?  ?FISH OIL TRIPLE STRENGTH PO ?Take 1,000 mg by mouth daily. ?  ?gabapentin 300 MG capsule ?Commonly known as: Neurontin ?Take 1 capsule (300 mg total) by mouth 3 (three) times daily. ?What changed:  ?when to take this ?reasons to take this ?  ?hydrochlorothiazide 12.5 MG tablet ?Commonly known as: HYDRODIURIL ?Take 12.5 mg by mouth daily as needed (fluid). ?  ?hydrOXYzine 50 MG capsule ?Commonly known as: VISTARIL ?Take 50 mg by mouth at bedtime. ?  ?metFORMIN 500 MG tablet ?Commonly known as: GLUCOPHAGE ?Take 500 mg  by mouth daily with breakfast. ?  ?montelukast 10 MG tablet ?Commonly known as: SINGULAIR ?TAKE 1 TABLET BY MOUTH EVERYDAY AT BEDTIME ?  ?nitroGLYCERIN 0.4 MG SL tablet ?Commonly known as: NITROSTAT ?Place 0.4 mg under the tongue every 5 (five) minutes as needed for chest pain. ?  ?Oxycodone HCl 20 MG Tabs ?Take 20 mg by mouth in the morning, at noon, in the evening, and at bedtime. ?  ?pantoprazole 40 MG tablet ?Commonly known as: PROTONIX ?Take 1 tablet (40 mg total) by mouth daily. ?  ?predniSONE 20 MG tablet ?Commonly known as: DELTASONE ?Take 2 tablets (40 mg total) by mouth daily with breakfast for 4 days. ?Start taking on: May 25, 2021 ?  ?sildenafil 100 MG tablet ?Commonly known as: VIAGRA ?Take 1 tablet (100 mg total) by mouth daily as needed. ?What changed: reasons to take this ?  ?Spiriva Respimat 2.5 MCG/ACT Aers ?Generic drug: Tiotropium Bromide Monohydrate ?Inhale 1 puff into the lungs every 12 (twelve) hours. ?  ?tamsulosin 0.4 MG Caps capsule ?Commonly known as: FLOMAX ?Take 1 capsule by mouth daily as needed (prostate). ?  ?VITAMIN C PO ?Take 2 tablets by mouth daily. ?  ?Vitamin D  (Ergocalciferol) 1.25 MG (50000 UNIT) Caps capsule ?Commonly known as: DRISDOL ?Take 50,000 Units by mouth once a week. Mondays ?  ?Xtampza ER 9 MG C12a ?Generic drug: oxyCODONE ER ?Take 1 capsule by mouth 2 (two) times da

## 2021-05-24 NOTE — Progress Notes (Signed)
Glenn [name] received to room 1614/1614-01 from ED with COPD exacerbation [facility]), Acute respiratory failure with hypoxia (HCC), and Respiratory distress. ? ?BP (!) 138/105   Pulse (!) 106   Temp 97.8 ?F (36.6 ?C) (Oral)   Resp 20   Ht 5\' 5"  (1.651 m)   Wt 65.9 kg   SpO2 97%   BMI 24.18 kg/m?  ? ?Patient oriented to room and unit.  Call bell and personal items in reach.  Admission history completed.  Plan of care initiated. ? BSN RN CMSRN ?05/24/2021, 4:50 AM ? ? ? ?  ?

## 2021-05-24 NOTE — TOC Transition Note (Signed)
Transition of Care (TOC) - CM/SW Discharge Note ? ? ?Patient Details  ?Name: Glenn [name] ?MRN: [id number] ?Date of Birth: 04-09-1960 ? ?Transition of Care (TOC) CM/SW Contact:  ?Tawanna Cooler, RN ?Phone Number: ?05/24/2021, 1:07 PM ? ? ?Clinical Narrative:    ? ?Received a consult that patient needs assistance getting another nebulizer machine.   ?Per [employer] rep, patient received a nebulizer machine in 2020.  Insurance will not pay for another one for 5 years.  If patient wants a nebulizer prior to the five years, he will need to private pay $115.   ?Patient states he just needs new tubing, as his is "gross."  [employer] states they can give hm a new tubing kit.   ? ? ?

## 2021-05-24 NOTE — ED Notes (Signed)
ED TO INPATIENT HANDOFF REPORT ? ?ED Nurse Name and Phone #: [phone] Erick Colace, RN  ? ?S ?Name/Age/Gender ?Glenn [name] ?61 y.o. ?male ?Room/Bed: RESB/RESB ? ?Code Status ?  Code Status: Full Code ? ?Home/SNF/Other ?Home ?Patient oriented to: self, place, time, and situation ?Is this baseline? Yes  ? ?Triage Complete: Triage complete  ?Chief Complaint ?Asthma exacerbation [J45.901] ? ?Triage Note ?Pt reports chest pain and progressively worsening SHOB x2 week. Pt reports his chest feels tight. Pt was sent here from UC for further evaluation.   ? ?Allergies ?Allergies  ?Allergen Reactions  ? Insulins Swelling  ? Ibuprofen   ?  Abdominal sensitivity  ? Shrimp [Shellfish Allergy] Itching  ? Tylenol [Acetaminophen]   ?  Abdominal sensitvity  ? Lisinopril Swelling and Rash  ?  Tongue swelling  ? ? ?Level of Care/Admitting Diagnosis ?ED Disposition   ? ? ED Disposition  ?Admit  ? Condition  ?--  ? Comment  ?Hospital Area: [facility] [id number] ? Level of Care: Med-Surg [16] ? May place patient in observation at [facility] or [place] if equivalent level of care is available:: Yes ? Covid Evaluation: Asymptomatic - no recent exposure (last 10 days) testing not required ? Diagnosis: Asthma exacerbation [id number] ? Admitting Physician: Elwyn Reach [[id number]] ? Attending Physician: Elwyn Reach [[id number]] ?  ?  ? ?  ? ? ?B ?Medical/Surgery History ?Past Medical History:  ?Diagnosis Date  ? Anxiety   ? Arthritis   ? Asthma   ? Chronic pain 05/06/2020  ? COPD (chronic obstructive pulmonary disease) ([place])   ? CVA (cerebral vascular accident) [facility])   ? 2010; 06/2020  ? Depression   ? Diabetes mellitus type 2 in nonobese [facility])   ? Dyspnea   ? ED (erectile dysfunction)   ? Hyperlipidemia   ? Hypertension   ? Sarcoid   ? Tobacco dependence 05/06/2020  ? ?Past Surgical History:  ?Procedure Laterality Date  ? LUNG SURGERY    ? removed tissue  ?  ? ?A ?IV Location/Drains/Wounds ?Patient Lines/Drains/Airways  Status   ? ? Active Line/Drains/Airways   ? ? Name Placement date Placement time Site Days  ? Peripheral IV 05/23/21 20 G 1" Left Antecubital 05/23/21  1640  Antecubital  1  ? ?  ?  ? ?  ? ? ?Intake/Output Last 24 hours ? ?Intake/Output Summary (Last 24 hours) at 05/24/2021 0134 ?Last data filed at 05/23/2021 2020 ?Gross per 24 hour  ?Intake 50 ml  ?Output --  ?Net 50 ml  ? ? ?Labs/Imaging ?Results for orders placed or performed during the hospital encounter of 05/23/21 (from the past 48 hour(s))  ?Basic metabolic panel     Status: Abnormal  ? Collection Time: 05/23/21  4:46 PM  ?Result Value Ref Range  ? Sodium 140 135 - 145 mmol/L  ? Potassium 4.1 3.5 - 5.1 mmol/L  ? Chloride 106 98 - 111 mmol/L  ? CO2 29 22 - 32 mmol/L  ? Glucose, Bld 100 (H) 70 - 99 mg/dL  ?  Comment: Glucose reference range applies only to samples taken after fasting for at least 8 hours.  ? BUN 20 8 - 23 mg/dL  ? Creatinine, Ser 0.75 0.61 - 1.24 mg/dL  ? Calcium 9.3 8.9 - 10.3 mg/dL  ? GFR, Estimated >60 >60 mL/min  ?  Comment: (NOTE) ?Calculated using the CKD-EPI Creatinine Equation (2021) ?  ? Anion gap 5 5 - 15  ?  Comment:  Performed at [facility], [place] [address], [place], [place] [address]  ?CBC with Differential/Platelet     Status: None  ? Collection Time: 05/23/21  4:46 PM  ?Result Value Ref Range  ? WBC 8.3 4.0 - 10.5 K/uL  ? RBC 5.09 4.22 - 5.81 MIL/uL  ? Hemoglobin 14.7 13.0 - 17.0 g/dL  ? HCT 45.6 39.0 - 52.0 %  ? MCV 89.6 80.0 - 100.0 fL  ? MCH 28.9 26.0 - 34.0 pg  ? MCHC 32.2 30.0 - 36.0 g/dL  ? RDW 13.6 11.5 - 15.5 %  ? Platelets 326 150 - 400 K/uL  ? nRBC 0.0 0.0 - 0.2 %  ? Neutrophils Relative % 39 %  ? Neutro Abs 3.2 1.7 - 7.7 K/uL  ? Lymphocytes Relative 46 %  ? Lymphs Abs 3.9 0.7 - 4.0 K/uL  ? Monocytes Relative 9 %  ? Monocytes Absolute 0.8 0.1 - 1.0 K/uL  ? Eosinophils Relative 5 %  ? Eosinophils Absolute 0.4 0.0 - 0.5 K/uL  ? Basophils Relative 1 %  ? Basophils Absolute 0.1 0.0 - 0.1 K/uL  ? Immature  Granulocytes 0 %  ? Abs Immature Granulocytes 0.02 0.00 - 0.07 K/uL  ?  Comment: Performed at [facility], [place] [address], [place], [place] [address]  ?Brain natriuretic peptide     Status: None  ? Collection Time: 05/23/21  4:46 PM  ?Result Value Ref Range  ? B Natriuretic Peptide 18.8 0.0 - 100.0 pg/mL  ?  Comment: Performed at [facility], [place] [address] [place], [place] [address]  ?D-dimer, quantitative     Status: None  ? Collection Time: 05/23/21  4:46 PM  ?Result Value Ref Range  ? D-Dimer, Quant 0.32 0.00 - 0.50 ug/mL-FEU  ?  Comment: (NOTE) ?At the manufacturer cut-off value of 0.5 ?g/mL FEU, this assay has a ?negative predictive value of 95-100%.This assay is intended for use ?in conjunction with a clinical pretest probability (PTP) assessment ?model to exclude pulmonary embolism (PE) and deep venous thrombosis ?(DVT) in outpatients suspected of PE or DVT. ?Results should be correlated with clinical presentation. ?Performed at [facility], [place] [address] ?[place], [place] [address] ?  ?Troponin I (High Sensitivity)     Status: None  ? Collection Time: 05/23/21  4:46 PM  ?Result Value Ref Range  ? Troponin I (High Sensitivity) 7 <18 ng/L  ?  Comment: (NOTE) ?Elevated high sensitivity troponin I (hsTnI) values and significant  ?changes across serial measurements may suggest ACS but many other  ?chronic and acute conditions are known to elevate hsTnI results.  ?Refer to the "Links" section for chest pain algorithms and additional  ?guidance. ?Performed at [facility], [place] [address] ?[place], [place] [address] ?  ?Blood gas, venous     Status: Abnormal  ? Collection Time: 05/23/21  4:51 PM  ?Result Value Ref Range  ? pH, Ven 7.39 7.25 - 7.43  ? pCO2, Ven 52 44 - 60 mmHg  ? pO2, Ven 57 (H) 32 - 45 mmHg  ? Bicarbonate 31.5 (H) 20.0 - 28.0 mmol/L  ? Acid-Base Excess 5.1 (H) 0.0 - 2.0 mmol/L  ? O2 Saturation 90.4 %  ? Patient  temperature 37.0   ?  Comment: Performed at [facility], [place] [address], [place],  [address]  ?Resp Panel by RT-PCR (Flu A&B, Covid) Nasopharyngeal Swab     Status: None  ? Collection Time: 05/23/21  7:46 PM  ? Specimen: Nasopharyngeal Swab; Nasopharyngeal(NP) swabs in vial transport medium  ?Result Value Ref Range  ? SARS Coronavirus 2 by RT PCR NEGATIVE NEGATIVE  ?  Comment: (NOTE) ?SARS-CoV-2 target nucleic acids are NOT DETECTED. ? ?The SARS-CoV-2 RNA is generally detectable in upper respiratory ?specimens during the acute phase of infection. The lowest ?concentration of SARS-CoV-2 viral copies this assay can detect is ?138 copies/mL. A negative result does not preclude SARS-Cov-2 ?infection and should not be used as the sole basis for treatment or ?other patient management decisions. A negative result may occur with  ?improper specimen collection/handling, submission of specimen other ?than nasopharyngeal swab, presence of viral mutation(s) within the ?areas targeted by this assay, and inadequate number of viral ?copies(<138 copies/mL). A negative result must be combined with ?clinical observations, patient history, and epidemiological ?information. The expected result is Negative. ? ?Fact Sheet for Patients:  ?EntrepreneurPulse.com.au ? ?Fact Sheet for Healthcare Providers:  ?IncredibleEmployment.be ? ?This test is no t yet approved or cleared by the [place] FDA and  ?has been authorized for detection and/or diagnosis of SARS-CoV-2 by ?FDA under an Emergency Use Authorization (EUA). This EUA will remain  ?in effect (meaning this test can be used) for the duration of the ?COVID-19 declaration under Section 564(b)(1) of the Act, 21 ?U.S.C.section 360bbb-3(b)(1), unless the authorization is terminated  ?or revoked sooner.  ? ? ?  ? Influenza A by PCR NEGATIVE NEGATIVE  ? Influenza B by PCR NEGATIVE NEGATIVE  ?  Comment: (NOTE) ?The Xpert Xpress  SARS-CoV-2/FLU/RSV plus assay is intended as an aid ?in the diagnosis of influenza from Nasopharyngeal swab specimens and ?should not be used as a sole basis for treatment. Nasal washings and ?aspirates are un

## 2021-05-24 NOTE — Progress Notes (Signed)
Discharge teaching complete. Meds, diet, activity, follow up appointments reviewed and all questions answered. Copy of instructions given to patient and prescriptions sent to pharmacy. Patient waiting on wife for discharge.  ?

## 2021-05-25 ENCOUNTER — Other Ambulatory Visit (HOSPITAL_COMMUNITY): Payer: Self-pay

## 2021-05-31 ENCOUNTER — Telehealth (HOSPITAL_COMMUNITY): Payer: Self-pay

## 2021-05-31 NOTE — Telephone Encounter (Signed)
Spoke with the patient, he wanted to reschedule to 06/03/21. His instructions were left on his cell phone answering machine. Asked to call back with any questions. S.Kalinda Romaniello EMTP ?

## 2021-06-01 ENCOUNTER — Ambulatory Visit (HOSPITAL_COMMUNITY): Payer: Medicaid Other

## 2021-06-01 ENCOUNTER — Other Ambulatory Visit (HOSPITAL_COMMUNITY): Payer: Self-pay

## 2021-06-03 ENCOUNTER — Ambulatory Visit (HOSPITAL_COMMUNITY): Payer: Medicaid Other | Attending: Cardiology

## 2021-06-03 ENCOUNTER — Other Ambulatory Visit (HOSPITAL_COMMUNITY): Payer: Self-pay

## 2021-06-03 ENCOUNTER — Other Ambulatory Visit: Payer: Self-pay | Admitting: *Deleted

## 2021-06-03 DIAGNOSIS — R931 Abnormal findings on diagnostic imaging of heart and coronary circulation: Secondary | ICD-10-CM

## 2021-06-03 DIAGNOSIS — R072 Precordial pain: Secondary | ICD-10-CM | POA: Diagnosis present

## 2021-06-03 LAB — MYOCARDIAL PERFUSION IMAGING
Base ST Depression (mm): 0 mm
LV dias vol: 75 mL (ref 62–150)
LV sys vol: 44 mL
Nuc Stress EF: 41 %
Peak HR: 126 {beats}/min
Rest HR: 96 {beats}/min
Rest Nuclear Isotope Dose: 10.6 mCi
SDS: 0
SRS: 0
SSS: 0
ST Depression (mm): 0 mm
Stress Nuclear Isotope Dose: 32.7 mCi
TID: 0.95

## 2021-06-03 MED ORDER — REGADENOSON 0.4 MG/5ML IV SOLN
0.4000 mg | Freq: Once | INTRAVENOUS | Status: AC
  Administered 2021-06-03: 0.4 mg via INTRAVENOUS

## 2021-06-03 MED ORDER — SILDENAFIL CITRATE 100 MG PO TABS
100.0000 mg | ORAL_TABLET | Freq: Every day | ORAL | 5 refills | Status: DC | PRN
Start: 2021-06-03 — End: 2021-09-30
  Filled 2021-06-03 – 2021-06-22 (×2): qty 30, 30d supply, fill #0
  Filled 2021-08-02: qty 30, 30d supply, fill #1

## 2021-06-03 MED ORDER — TECHNETIUM TC 99M TETROFOSMIN IV KIT
32.7000 | PACK | Freq: Once | INTRAVENOUS | Status: AC | PRN
  Administered 2021-06-03: 32.7 via INTRAVENOUS
  Filled 2021-06-03: qty 33

## 2021-06-03 MED ORDER — TECHNETIUM TC 99M TETROFOSMIN IV KIT
10.6000 | PACK | Freq: Once | INTRAVENOUS | Status: AC | PRN
  Administered 2021-06-03: 10.6 via INTRAVENOUS
  Filled 2021-06-03: qty 11

## 2021-06-03 NOTE — Progress Notes (Signed)
echo

## 2021-06-14 ENCOUNTER — Ambulatory Visit (HOSPITAL_COMMUNITY): Payer: Medicaid Other

## 2021-06-17 ENCOUNTER — Ambulatory Visit: Payer: Medicaid Other | Admitting: Adult Health

## 2021-06-17 ENCOUNTER — Encounter: Payer: Self-pay | Admitting: Adult Health

## 2021-06-17 VITALS — BP 162/94 | HR 91 | Ht 65.0 in | Wt 149.0 lb

## 2021-06-17 DIAGNOSIS — I639 Cerebral infarction, unspecified: Secondary | ICD-10-CM

## 2021-06-17 NOTE — Progress Notes (Signed)
?[facility] ?[address] ?[place]. [place] [address] ?[phone] ? ?     STROKE FOLLOW UP NOTE ? ?[name] ?Date of Birth:  Mar 11, 1960 ?Medical Record Number:  [id number]  ? ?Reason for Referral: stroke follow up ? ? ? ?SUBJECTIVE: ? ? ?CHIEF COMPLAINT:  ?Chief Complaint  ?Patient presents with  ? Follow-up  ?  Rm 3 here for 6 month f/u- Reports he has been doing well.   ? ? ? ?HPI:  ? ?Update 06/17/2021 JM: Patient returns for 65-month stroke follow-up.  Overall stable without new stroke/TIA symptoms.  Residual right-sided weakness stable since prior visit with some improvement noted.  Use of cane only as needed (typically with worsening back pain), denies any recent falls. Compliant on Plavix and atorvastatin, denies side effects.  Blood pressure today 162/94. Monitors at home typically stable, can have some fluctuation due to pain. Managed by cardiology.  Reports glucose levels monitored at home which have been stable.  Recent A1c 7.6 (04/2021). No new concerns at this time.  ? ? ? ?History provided for reference purposes only ?Update 12/09/2020 JM: Returns for 38-month stroke follow-up unaccompanied. ? ?Overall stable from stroke standpoint without new stroke/TIA symptoms ?Reports residual right sided weakness - improvement since prior visit. Use of cane only as needed based on level of pain in lower back ? ?Fiance manages pill in a pill box  ?Compliant on Plavix and atorvastatin -denies side effects ?Blood pressure today 145/93 - monitors at home and has been stable - elevated today - he believes due to increased back pain this AM ?Glucose levels good at home - no recent A1c level ?Routine lab work not recently completed  ? ?Reports PCP has been trying to manage anxiety/depression - he has since been referred to psychiatry - initial visit 11/11.  ? ?He asks about cause of stroke and concerned he has not been seen by cardiologist to further evaluate reason for stroke.  This was fully  discussed at prior visit but again discussed today ? ?No further concerns at this time ? ?Initial visit 08/06/2020 JM: [name] is being seen for hospital stroke follow up unaccompanied.  He reports he has been doing well since discharge with residual right-sided weakness and imbalance with ongoing improvement.  He is currently ambulating with a cane and denies any recent falls.  He is currently working with PT for chronic low back pain but has not yet been seen by neuro rehab PT/OT for residual stroke deficits although he is interested in seeing them once low back pain PT completed.  Has remained on Plavix and atorvastatin without associated side effects.  Blood pressure today 139/88.  Monitors glucose levels at home which have been stable.  No concerns at this time. ? ?Stroke admission 06/23/2020 ?[name] is a 61 y.o. male with history of hypertension, type 2 diabetes mellitus, and tobacco use who presented to the ED on 06/23/2020 for evaluation of right arm and lower extremity weakness.  Personally reviewed hospitalization pertinent progress notes, lab work and imaging with summary provided.  Evaluated by Dr. Erlinda Hong with stroke work-up revealing acute/subacute infarct within the posterior limb of left internal capsule likely secondary to small vessel disease.  HTN stable and resumed home meds.  LDL 135 and initiate atorvastatin 40 mg daily.  Controlled DM with A1c 6.8.  Recent tobacco use quitting 6 weeks prior with 17-pack-year history.  UDS positive for benzos.  Other stroke risk factors include history of stroke  2010.  Multiple other active problems including COPD, arthritis, asthma, chronic pain, anxiety and GERD.  PT/OT recommended CIR although patient declined and wished to go home with outpatient physical therapy. ?  ?Stroke: acute/subacute infarct within the posterior limb of the left internal capsule likely small vessel disease ?CT head: chronic small vessel disease.  ?MRI:  acute/subacute infarct  within the posterior limb of the left internal capsule. ?      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 ?MRA Unremarkable, no LVO ?Carotid Doppler: 1-39% stenosis ?2D Echo EF 55 to 60% ?LDL 135 ?HgbA1c 6.8 ?VTE prophylaxis - SCDs ?Diet: heart healthy/carb modified ?aspirin 81 mg daily prior to admission, now on aspirin 81 mg daily and clopidogrel 75 mg daily. DAPT with aspirin and plavix for 3 weeks then plavix alone. ?Therapy recommendations:  CIR --> OP PT ?Disposition:  home ? ? ? ? ? ? ?ROS:   ?14 system review of systems performed and negative with exception of those listed in HPI ? ?PMH:  ?Past Medical History:  ?Diagnosis Date  ? Anxiety   ? Arthritis   ? Asthma   ? Chronic pain 05/06/2020  ? COPD (chronic obstructive pulmonary disease) ([place])   ? CVA (cerebral vascular accident) [facility])   ? 2010; 06/2020  ? Depression   ? Diabetes mellitus type 2 in nonobese [facility])   ? Dyspnea   ? ED (erectile dysfunction)   ? Hyperlipidemia   ? Hypertension   ? Sarcoid   ? Tobacco dependence 05/06/2020  ? ? ?PSH:  ?Past Surgical History:  ?Procedure Laterality Date  ? LUNG SURGERY    ? removed tissue  ? ? ?Social History:  ?Social History  ? ?Socioeconomic History  ? Marital status: Married  ?  Spouse name: Not on file  ? Number of children: Not on file  ? Years of education: Not on file  ? Highest education level: Not on file  ?Occupational History  ? Occupation: disabled  ?Tobacco Use  ? Smoking status: Former  ?  Packs/day: 0.50  ?  Years: 34.00  ?  Pack years: 17.00  ?  Types: Cigarettes  ?  Quit date: 05/11/2020  ?  Years since quitting: 1.1  ? Smokeless tobacco: Never  ? Tobacco comments:  ?  at the most smoked 6-7 cigarettes/day  ?Vaping Use  ? Vaping Use: Never used  ?Substance and Sexual Activity  ? Alcohol use: Never  ? Drug use: Never  ? Sexual activity: Not on file  ?Other Topics Concern  ? Not on file  ?Social History Narrative  ? Lives with wife  ? ?Social Determinants of Health   ? ?Financial Resource Strain: Not on file  ?Food Insecurity: Not on file  ?Transportation Needs: Not on file  ?Physical Activity: Not on file  ?Stress: Not on file  ?Social Connections: Not on file  ?Intimate Partner Violence: Not on file  ? ? ?Family History:  ?Family History  ?Problem Relation Age of Onset  ? Hypertension Mother   ? Diabetes Maternal Grandmother   ? Colon cancer Neg Hx   ? Esophageal cancer Neg Hx   ? Rectal cancer Neg Hx   ? Inflammatory bowel disease Neg Hx   ? Liver disease Neg Hx   ? Pancreatic cancer Neg Hx   ? ? ?Medications:   ?Current Outpatient Medications on File Prior to Visit  ?Medication Sig Dispense Refill  ? ACCU-CHEK GUIDE test strip TEST BLOOD GLUCOSE DAILY    ?  Accu-Chek Softclix Lancets lancets SMARTSIG:Topical Once a Month    ? albuterol (PROAIR HFA) 108 (90 Base) MCG/ACT inhaler 2 puffs every 4 hours as needed only  if your can't catch your breath (Patient taking differently: 2 puffs in the morning, at noon, in the evening, and at bedtime.) 18 g 11  ? albuterol (PROVENTIL) (2.5 MG/3ML) 0.083% nebulizer solution Take 3 mLs (2.5 mg total) by nebulization every 6 (six) hours as needed for wheezing or shortness of breath. 120 mL 11  ? AMITIZA 24 MCG capsule Take 24 mcg by mouth daily as needed for constipation.    ? amLODipine (NORVASC) 10 MG tablet Take 10 mg by mouth daily.    ? Ascorbic Acid (VITAMIN C PO) Take 2 tablets by mouth daily.    ? atorvastatin (LIPITOR) 40 MG tablet Take 1 tablet (40 mg total) by mouth daily. 30 tablet 2  ? budesonide-formoterol (SYMBICORT) 160-4.5 MCG/ACT inhaler Inhale 2 puffs into the lungs 2 (two) times daily. 1 each 11  ? cetirizine (ZYRTEC ALLERGY) 10 MG tablet Take 1 tablet (10 mg total) by mouth at bedtime. (Patient taking differently: Take 10 mg by mouth daily as needed for allergies.) 30 tablet 6  ? clopidogrel (PLAVIX) 75 MG tablet Take 1 tablet (75 mg total) by mouth daily. 90 tablet 3  ? diclofenac Sodium (VOLTAREN) 1 % GEL Apply 1  application. topically daily as needed for pain.    ? DULoxetine (CYMBALTA) 60 MG capsule Take 60 mg by mouth daily.    ? ferrous sulfate 325 (65 FE) MG EC tablet Take 325 mg by mouth daily with breakfast.    ? gabap

## 2021-06-17 NOTE — Patient Instructions (Addendum)
Continue clopidogrel 75 mg daily  and atorvastatin  for secondary stroke prevention ? ?Continue to follow up with PCP regarding blood pressure, cholesterol and diabetes management  ?Maintain strict control of hypertension with blood pressure goal below 130/90, diabetes with hemoglobin A1c goal below 7.0 % and cholesterol with LDL cholesterol (bad cholesterol) goal below 70 mg/dL.  ? ?Signs of a Stroke? Follow the BEFAST method:  ?Balance Watch for a sudden loss of balance, trouble with coordination or vertigo ?Eyes Is there a sudden loss of vision in one or both eyes? Or double vision?  ?Face: Ask the person to smile. Does one side of the face droop or is it numb?  ?Arms: Ask the person to raise both arms. Does one arm drift downward? Is there weakness or numbness of a leg? ?Speech: Ask the person to repeat a simple phrase. Does the speech sound slurred/strange? Is the person confused ? ?Time: If you observe any of these signs, call 911. ? ? ? ? ? ? ?Thank you for coming to see us at [facility]. I hope we have been able to provide you high quality care today. ? ?You may receive a patient satisfaction survey over the next few weeks. We would appreciate your feedback and comments so that we may continue to improve ourselves and the health of our patients. ? ?

## 2021-06-22 ENCOUNTER — Other Ambulatory Visit (HOSPITAL_COMMUNITY): Payer: Medicaid Other

## 2021-06-22 ENCOUNTER — Other Ambulatory Visit (HOSPITAL_COMMUNITY): Payer: Self-pay

## 2021-06-23 ENCOUNTER — Ambulatory Visit (HOSPITAL_COMMUNITY): Payer: Medicaid Other | Attending: Cardiology

## 2021-06-23 ENCOUNTER — Encounter (HOSPITAL_COMMUNITY): Payer: Self-pay

## 2021-06-23 NOTE — Progress Notes (Signed)
Verified appointment "no show" status with Glenn [name] at 16:04.  ?

## 2021-06-24 ENCOUNTER — Encounter (HOSPITAL_COMMUNITY): Payer: Self-pay | Admitting: Cardiology

## 2021-07-21 ENCOUNTER — Telehealth: Payer: Self-pay | Admitting: Internal Medicine

## 2021-07-23 MED ORDER — BUDESONIDE-FORMOTEROL FUMARATE 160-4.5 MCG/ACT IN AERO: 2.0000 | INHALATION_SPRAY | Freq: Two times a day (BID) | RESPIRATORY_TRACT | 11 refills | Status: DC

## 2021-07-23 NOTE — Telephone Encounter (Signed)
Called patient and verified what medication he needed refilled and the pharmacy he would like the refills sent to. Refills sent per patient request. Nothing further needed

## 2021-08-02 ENCOUNTER — Other Ambulatory Visit (HOSPITAL_COMMUNITY): Payer: Self-pay

## 2021-08-04 NOTE — Telephone Encounter (Signed)
Called and spoke with pt to see if he ever received new nebulizer equipment and he stated that he had. Nothing further needed.

## 2021-08-15 NOTE — Progress Notes (Unsigned)
Subjective:     Patient ID: Glenn [name], male   DOB: [birth year]     MRN: [id number]      Brief patient profile:  6 yobm quit smoking 09/2020   with onset of symptoms in 1995 while in [place] dx as asthma vs sarcoid and rx pred/ theoph / albuterol seen by allergist dx as pollen moved to [place] where underwent bilateral lung surgery 2011 ? Lung vol reduction ? Helped breathing at the time but uses [facility] parking and  [facility] = can't walk a nl pace on a flat grade s sob but does fine slow and flat so referred to pulmonary clinic 10/05/2016 by Dr  Tyson Dense      History of Present Illness  10/05/2016 1st [facility] office visit/ Glenn [name]   Chief Complaint  Patient presents with   Pulmonary Consult    Referred by Dr. Alveta Heimlich for eval of Asthma and COPD. Pt states he was dxed with COPD and Asthma in 1995. He moved here a year ago from [place], [place]. He states his breathing is "okay" today. He states he has a "slight cold"- prod cough with minimal brown sputum.    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  Work on [employer] B = Backup Only use your albuterol (proair) as a rescue medication Plan C = Crisis - only use your albuterol nebulizer if you first try Plan B and it fails to help > ok to use the nebulizer up to every 4 hours but if start needing it regularly call for immediate appointment Please schedule a follow up office visit in 6 weeks, call sooner if needed with pfts    01/15/2020  f/u ov/Glenn [name] re:  Copd II / still  smoking / no really on maint symb/ doesn't remember rx with spiriva previously  Chief Complaint  Patient presents with   Follow-up    Breathing is overall doing well. He states that he uses his albuterol inhaler about every 4 hours and uses neb every night.   Dyspnea:  MMRC2 = can't walk a nl pace on a flat grade s sob but does fine slow and flat  Cough: none  Sleeping: 3 h p lie down needs nebulizer  SABA use: way too much /totally confused on maint vs prns 02: none  Prednisone always helps and when it does he tends to stop everything not understanding the difference between maint and prns Rec Plan A = Automatic = Always=    symbicort 160 Take 2 puffs first thing in am and then another 2 puffs about 12 hours later and spiriva 2 pffs each am  Prednisone 10 mg take  4 each am x 2 days,   2 each am x 2 days,  1 each am x 2 days and stop   Plan B = Backup (to supplement plan A,  not to replace it) Only use your albuterol inhaler as a rescue medication Plan C = Crisis (instead of Plan B but only if Plan B stops working) - only use your albuterol nebulizer if you first try Plan B and it fails to help > ok to use the nebulizer up to every 4 hours but if start needing it regularly call for immediate appointment The key is to stop smoking completely before smoking completely stops you! I very strongly recommend you get the moderna or pfizer vaccine  If you are satisfied with your treatment plan,  let your doctor know and he/she can either refill your medications or you can return here when your prescription runs out but you must bring all active medications with you if you want my help.  NP recs  06/09/20   HST/pfts   08/11/2020  f/u ov/Glenn [name] re: COPD II/ bronchiectasis with remote lung resection  (? Lung vol reduction)  Chief Complaint  Patient presents with   Follow-up    Go over PFT. Patient denies any concerns,    Dyspnea:  MMRC1 = can walk nl pace, flat grade, can't hurry or go uphills or steps  s sob   Cough: none  Sleeping: no resp cc SABA use: confused  02: none  Covid status:   never vax /  never infected  Rec Plan A = Automatic = Always=    symbicort 160 Take 2 puffs first thing in am and then another 2 puffs about 12 hours later and spiriva 2 pffs each am  Work on inhaler technique:  Plan B = Backup (to supplement plan A, not to replace it) Only use your albuterol inhaler as a rescue medication  Plan C = Crisis (instead of Plan B but only if Plan B stops working) - only use your albuterol nebulizer if you first try Plan B and it fails to help   I very strongly recommend you get the moderna or pfizer vaccine   Please schedule a follow up visit in 3 months but call sooner if needed  with all medications /inhalers/ solutions in hand so we can verify exactly what you are taking. This includes all medications from all doctors and over the counters    11/11/2020  f/u ov/Glenn [name] re: quit smoking 09/2020 copd 2/ bronchiectasis s/p remote resection ? Lung vol reduction  maint on symbicort/spiriva  Chief Complaint  Patient presents with   Follow-up    SOB has worsened, patient believes he's getting a cold. Has a dry cough.  Dyspnea:  baseline = still MMRC2 = can't walk a nl pace on a flat grade s sob but does fine slow and flat eg parking lot  Cough: several days  Sleeping: no resp cc / bed is flat variable pillows  SABA use: neb once or twice a month/ maybe twice a week  02: none  Covid status:   none/ infected omicron  Rec  I very strongly recommend you get the moderna or pfizer vaccine as soon as possible    Plan A = Automatic = Always=    Symbicort 160 Take 2 puffs first thing in am and then another 2 puffs about 12 hours later.  And spiriva 2 puffs each am Work on inhaler technique Remember how golfers take practice swings  Plan B = Backup (to supplement plan A, not to replace it) Only use your albuterol inhaler as a rescue medication  Plan C = Crisis (instead of Plan B but  only if  Plan B stops working) - only use your albuterol nebulizer (8x stronger)  if you first try Plan B    08/16/2021  f/u ov/Glenn [name] re: GOLD  2  maint on symb/spiriva  and lots of saba p exertion (not the instruction given)  Chief Complaint  Patient presents with   Follow-up    Pt states his breathing is good but he has a  sore throat.   Dyspnea: MMRC2 = can't walk a nl pace on a flat grade s sob but does fine slow and flat   Cough: minimal dry  Sleeping: 30 degrees electric bed  SABA use: none / nebulizer  once or daily never prechallenges or rechallenges  02: none  Covid status:  never vax  Prednisone always helps breathing transiently/ interested in pulm rehab    No obvious day to day or daytime variability or assoc excess/ purulent sputum or mucus plugs or hemoptysis or cp or chest tightness, subjective wheeze or overt sinus or hb symptoms.   Sleepingas above  without nocturnal  or early am exacerbation  of respiratory  c/o's or need for noct saba. Also denies any obvious fluctuation of symptoms with weather or environmental changes or other aggravating or alleviating factors except as outlined above   No unusual exposure hx or h/o childhood pna/ asthma or knowledge of premature birth.  Current Allergies, Complete Past Medical History, Past Surgical History, Family History, and Social History were reviewed in [employer] record.  ROS  The following are not active complaints unless bolded Hoarseness, sore throat, dysphagia, dental problems, itching, sneezing,  nasal congestion or discharge of excess mucus or purulent secretions, ear ache,   fever, chills, sweats, unintended wt loss or wt gain, classically pleuritic or exertional cp,  orthopnea pnd or arm/hand swelling  or leg swelling, presyncope, palpitations, abdominal pain, anorexia, nausea, vomiting, diarrhea  or change in bowel habits or change in bladder habits, change in stools or change in urine, dysuria,  hematuria,  rash, arthralgias, visual complaints, headache, numbness, weakness or ataxia or problems with walking or coordination,  change in mood or  memory.        Current Meds  Medication Sig   ACCU-CHEK GUIDE test strip TEST BLOOD GLUCOSE DAILY   Accu-Chek Softclix Lancets lancets SMARTSIG:Topical Once a Month   albuterol (PROAIR HFA) 108 (90 Base) MCG/ACT inhaler 2 puffs every 4 hours as needed only  if your can't catch your breath (Patient taking differently: 2 puffs in the morning, at noon, in the evening, and at bedtime.)   albuterol (PROVENTIL) (2.5 MG/3ML) 0.083% nebulizer solution Take 3 mLs (2.5 mg total) by nebulization every 6 (six) hours as needed for wheezing or shortness of breath.   AMITIZA 24 MCG capsule Take 24 mcg by mouth daily as needed for constipation.   amLODipine (NORVASC) 10 MG tablet Take 10 mg by mouth daily.   Ascorbic Acid (VITAMIN C PO) Take 2 tablets by mouth daily.   atorvastatin (LIPITOR) 40 MG tablet Take 1 tablet (40 mg total) by mouth daily.   budesonide-formoterol (SYMBICORT) 160-4.5 MCG/ACT inhaler Inhale 2 puffs into the lungs 2 (two) times daily.   cetirizine (ZYRTEC ALLERGY) 10 MG tablet Take 1 tablet (10 mg total) by mouth at bedtime. (Patient taking differently: Take 10 mg by mouth daily as needed for allergies.)   diclofenac Sodium (VOLTAREN) 1 % GEL Apply 1 application. topically daily as needed for pain.   DULoxetine (CYMBALTA) 60 MG capsule Take 60 mg by  mouth daily.   ferrous sulfate 325 (65 FE) MG EC tablet Take 325 mg by mouth daily with breakfast.   gabapentin (NEURONTIN) 300 MG capsule Take 1 capsule (300 mg total) by mouth 3 (three) times daily. (Patient taking differently: Take 300 mg by mouth 3 (three) times daily as needed (siactic nerve).)   hydrochlorothiazide (HYDRODIURIL) 12.5 MG tablet Take 12.5 mg by mouth daily as needed (fluid).   hydrOXYzine (VISTARIL) 50 MG capsule Take 50 mg by mouth at bedtime.   metFORMIN (GLUCOPHAGE) 500 MG  tablet Take 500 mg by mouth daily with breakfast.   montelukast (SINGULAIR) 10 MG tablet TAKE 1 TABLET BY MOUTH EVERYDAY AT BEDTIME   nitroGLYCERIN (NITROSTAT) 0.4 MG SL tablet Place 0.4 mg under the tongue every 5 (five) minutes as needed for chest pain.   Omega-3 Fatty Acids (FISH OIL TRIPLE STRENGTH PO) Take 1,000 mg by mouth daily.   Oxycodone HCl 20 MG TABS Take 20 mg by mouth in the morning, at noon, in the evening, and at bedtime.   pantoprazole (PROTONIX) 40 MG tablet Take 1 tablet (40 mg total) by mouth daily.   sildenafil (VIAGRA) 100 MG tablet Take 1 tablet (100 mg total) by mouth daily as needed.   sildenafil (VIAGRA) 100 MG tablet Take 1 tablet (100 mg total) by mouth daily as needed.   tamsulosin (FLOMAX) 0.4 MG CAPS capsule Take 1 capsule by mouth daily as needed (prostate).   Tiotropium Bromide Monohydrate (SPIRIVA RESPIMAT) 2.5 MCG/ACT AERS Inhale 1 puff into the lungs every 12 (twelve) hours.   vitamin B-12 1000 MCG tablet Take 1 tablet (1,000 mcg total) by mouth daily.   Vitamin D, Ergocalciferol, (DRISDOL) 1.25 MG (50000 UNIT) CAPS capsule Take 50,000 Units by mouth once a week. Mondays   XTAMPZA ER 9 MG C12A Take 1 capsule by mouth 2 (two) times daily.                     Objective:   Physical Exam   Wts  08/16/2021         149  11/11/2020        147  08/11/2020          144 01/15/2020        140 03/26/2019        144  11/20/2018      144  08/14/2018          144  02/08/2018          143  11/22/17 142 lb (64.4 kg)  10/05/16 131 lb 12.8 oz (59.8 kg)     Vital signs reviewed  11/11/2020  - Note at rest 02 sats  95% on RA   HEENT :  Oropharynx clear   Nasal turbinates nl    NECK :  without JVD/Nodes/TM/ nl carotid upstrokes bilaterally   LUNGS: no acc muscle use,  Mod barrel  contour chest wall with bilateral  Distant bs esp on L s audible wheeze and  without cough on insp or exp maneuvers and mod  Hyperresonant  to  percussion on R and dull L base    CV:   RRR  no s3 or murmur or increase in P2, and no edema   ABD:  soft and nontender with pos mid insp Hoover's  in the supine position. No bruits or organomegaly appreciated, bowel sounds nl  MS:   Ext warm without deformities or   obvious joint restrictions , calf tenderness, cyanosis or  clubbing  SKIN: warm and dry without lesions    NEURO:  alert, approp, nl sensorium with  no motor or cerebellar deficits apparent.         CXR PA and Lateral:   08/16/2021 :    I personally reviewed images and impression is as follows:     Copd/ chronically elevated L HD   Assessment:

## 2021-08-16 ENCOUNTER — Ambulatory Visit (INDEPENDENT_AMBULATORY_CARE_PROVIDER_SITE_OTHER): Payer: Medicaid Other | Admitting: Internal Medicine

## 2021-08-16 ENCOUNTER — Ambulatory Visit (INDEPENDENT_AMBULATORY_CARE_PROVIDER_SITE_OTHER): Payer: Medicaid Other

## 2021-08-16 ENCOUNTER — Encounter: Payer: Self-pay | Admitting: Internal Medicine

## 2021-08-16 DIAGNOSIS — J986 Disorders of diaphragm: Secondary | ICD-10-CM | POA: Diagnosis not present

## 2021-08-16 DIAGNOSIS — J449 Chronic obstructive pulmonary disease, unspecified: Secondary | ICD-10-CM

## 2021-08-16 MED ORDER — ALBUTEROL SULFATE (2.5 MG/3ML) 0.083% IN NEBU
2.5000 mg | INHALATION_SOLUTION | RESPIRATORY_TRACT | 11 refills | Status: DC | PRN
Start: 2021-08-16 — End: 2021-11-19

## 2021-08-16 MED ORDER — PREDNISONE 10 MG PO TABS: ORAL_TABLET | ORAL | 0 refills | Status: AC

## 2021-08-16 NOTE — Assessment & Plan Note (Addendum)
Quit smoking 05/2020    - Spirometry 10/05/2016  FEV1 1.36 (52%)  Ratio 65 p saba with classic curvature - 10/05/2016  > try dulera 200 2bid   - Allergy profile 11/22/2017 >  Eos 0.6 /  IgE  25 RAST neg x for roaches/ neg for mold  - 11/22/2017   continue dulera 200 2bid  - PFT's  02/08/2018  FEV1 0.90 (36 % ) ratio 59   p multiple saba prior to study with DLCO  52 % corrects to 106  % for alv volume   02/08/2018  After extensive coaching inhaler device,  effectiveness =    90% from a baseline of 75% with smi so try spiriva respimat - 08/14/2018    changed to trelegy but preferred symb/spriva - 11/20/2018     Breztri > better but not covered by medicaid - 03/26/2019  After extensive coaching inhaler device,  effectiveness =    75% (short Ti) > rechallenge with Breztri > not covered, resumed symb 160 but only took prn - 01/15/2020 re-started on symb 160/spiriva 2.5 daily  - 08/11/2020  After extensive coaching inhaler device,  effectiveness =    75% > continue symbicort/ spiriva - PFT's  08/11/2020  FEV1 1.34 (54 % ) ratio 0.61  p 10 % improvement from saba p spiriva prior to study with DLCO  11.94 (52%) corrects to 3.41 (77%)  for alv volume and FV curve classic concavity   - 08/16/2021  After extensive coaching inhaler device,  effectiveness =    75% (short Ti)    Group D (now reclassified as E) in terms of symptom/risk and laba/lama/ICS  therefore appropriate rx at this point >>>  Spiriva/symb 160 and more approp saba - give one course pred x 6 days to get him back to more reasonable approp level of saba use   Re SABA :  I spent extra time with pt today reviewing appropriate use of albuterol for prn use on exertion with the following points: 1) saba is for relief of sob that does not improve by walking a slower pace or resting but rather if the pt does not improve after trying this first. 2) If the pt is convinced, as many are, that saba helps recover from activity faster then it's easy to tell if this is the  case by re-challenging : ie stop, take the inhaler, then p 5 minutes try the exact same activity (intensity of workload) that just caused the symptoms and see if they are substantially diminished or not after saba 3) if there is an activity that reproducibly causes the symptoms, try the saba 15 min before the activity on alternate days   If in fact the saba really does help, then fine to continue to use it prn but advised may need to look closer at the maintenance regimen being used to achieve better control of airways disease with exertion.     >>> referred for pulmonary rehab and lung cancer screening today

## 2021-08-16 NOTE — Patient Instructions (Addendum)
Please remember to go to the  x-ray department  for your tests - we will call you with the results when they are available.  Prednisone 10 mg take  4 each am x 2 days,   2 each am x 2 days,  1 each am x 2 days and stop   Plan A = Automatic = Always=    symbicort 160  Take 2 puffs first thing in am and then another 2 puffs about 12 hours later. Spiriva is 2 pffs after the symbicort  Work on inhaler technique:  relax and gently blow all the way out then take a nice smooth full deep breath back in, triggering the inhaler at same time you start breathing in.  Hold for up to 5 seconds if you can. Blow out symbicort  160 thru nose. Rinse and gargle with water when done.  If mouth or throat bother you at all,  try brushing teeth/gums/tongue with arm and hammer toothpaste/ make a slurry and gargle and spit out.       Plan B = Backup (to supplement plan A, not to replace it) Only use your albuterol inhaler as a rescue medication to be used if you can't catch your breath by resting or doing a relaxed purse lip breathing pattern.  - The less you use it, the better it will work when you need it. - Ok to use the inhaler up to 2 puffs  every 4 hours if you must but call for appointment if use goes up over your usual need - Don't leave home without it !!  (think of it like starter fluid for your car)   Plan C = Crisis (instead of Plan B but only if Plan B stops working) - only use your albuterol nebulizer if you first try Plan B and it fails to help > ok to use the nebulizer up to every 4 hours but if start needing it regularly call for immediate appointment  Ok to try albuterol 15 min before an activity (on alternating days remembering the nebulizer is 8 x stronger)  that you know would usually make you short of breath and see if it makes any difference and if makes none then don't take albuterol after activity unless you can't catch your breath as this means it's the resting that helps, not the albuterol.        My office will be contacting you by phone for referral to pulmonary rehab    Please schedule a follow up visit in 3 months but call sooner if needed

## 2021-08-16 NOTE — Assessment & Plan Note (Signed)
Noted 10/05/2016 s baseline but reports bilateral lung surgery in [place] 2011  >>> rec sniff on return for lung cancer screening CT           Each maintenance medication was reviewed in detail including emphasizing most importantly the difference between maintenance and prns and under what circumstances the prns are to be triggered using an action plan format where appropriate.  Total time for H and P, chart review, counseling, reviewing hfa/neb/smi device(s) and generating customized AVS unique to this office visit / same day charting = 36 min

## 2021-08-17 NOTE — Progress Notes (Signed)
Spoke with pt and notified of results per Dr. Wert. Pt verbalized understanding and denied any questions. 

## 2021-08-23 ENCOUNTER — Other Ambulatory Visit (HOSPITAL_COMMUNITY): Payer: Self-pay

## 2021-08-24 ENCOUNTER — Other Ambulatory Visit: Payer: Self-pay

## 2021-08-24 DIAGNOSIS — Z122 Encounter for screening for malignant neoplasm of respiratory organs: Secondary | ICD-10-CM

## 2021-08-24 DIAGNOSIS — Z87891 Personal history of nicotine dependence: Secondary | ICD-10-CM

## 2021-09-08 ENCOUNTER — Encounter: Payer: Medicaid Other | Admitting: Acute Care

## 2021-09-10 ENCOUNTER — Ambulatory Visit (HOSPITAL_BASED_OUTPATIENT_CLINIC_OR_DEPARTMENT_OTHER): Payer: Medicaid Other

## 2021-09-28 ENCOUNTER — Encounter: Payer: Self-pay | Admitting: Acute Care

## 2021-09-28 ENCOUNTER — Ambulatory Visit (INDEPENDENT_AMBULATORY_CARE_PROVIDER_SITE_OTHER): Payer: Medicaid Other | Admitting: Acute Care

## 2021-09-28 DIAGNOSIS — Z87891 Personal history of nicotine dependence: Secondary | ICD-10-CM | POA: Diagnosis not present

## 2021-09-28 NOTE — Progress Notes (Signed)
Virtual Visit via Telephone Note  I connected with Glenn [name] on 12/22/20 at  2:00 PM EST by telephone and verified that I am speaking with the correct person using two identifiers.  Location: Patient: Home Provider: Working from home   I discussed the limitations, risks, security and privacy concerns of performing an evaluation and management service by telephone and the availability of in person appointments. I also discussed with the patient that there may be a patient responsible charge related to this service. The patient expressed understanding and agreed to proceed.  Shared Decision Making Visit Lung Cancer Screening Program [phone])   Eligibility: Age 61 y.o. Pack Years Smoking History Calculation 23 (# packs/per year x # years smoked) Recent History of coughing up blood  no Unexplained weight loss? no ( >Than 15 pounds within the last 6 months ) Prior History Lung / other cancer no (Diagnosis within the last 5 years already requiring surveillance chest CT Scans). Smoking Status Former Smoker Former Smokers: Years since quit: 1 year  Quit Date: 2022  Visit Components: Discussion included one or more decision making aids. yes Discussion included risk/benefits of screening. yes Discussion included potential follow up diagnostic testing for abnormal scans. yes Discussion included meaning and risk of over diagnosis. yes Discussion included meaning and risk of False Positives. yes Discussion included meaning of total radiation exposure. yes  Counseling Included: Importance of adherence to annual lung cancer LDCT screening. yes Impact of comorbidities on ability to participate in the program. yes Ability and willingness to under diagnostic treatment. yes  Smoking Cessation Counseling: Current Smokers:  Discussed importance of smoking cessation. yes Information about tobacco cessation classes and interventions provided to patient. yes Patient provided with "ticket" for  LDCT Scan. yes Symptomatic Patient. no  Counseling NA Diagnosis Code: Tobacco Use Z72.0 Asymptomatic Patient yes  Counseling NA Former Smokers:  Discussed the importance of maintaining cigarette abstinence. yes Diagnosis Code: Personal History of Nicotine Dependence. P80.998 Information about tobacco cessation classes and interventions provided to patient. Yes Patient provided with "ticket" for LDCT Scan. yes Written Order for Lung Cancer Screening with LDCT placed in Epic. Yes (CT Chest Lung Cancer Screening Low Dose W/O CM) [id number] Z12.2-Screening of respiratory organs Z87.891-Personal history of nicotine dependence   I spent 25 minutes of face to face time with him discussing the risks and benefits of lung cancer screening. We viewed a power point together that explained in detail the above noted topics. We took the time to pause the power point at intervals to allow for questions to be asked and answered to ensure understanding. We discussed that he had taken the single most powerful action possible to decrease his risk of developing lung cancer when he quit smoking. I counseled him to remain smoke free, and to contact me if he ever had the desire to smoke again so that I can provide resources and tools to help support the effort to remain smoke free. We discussed the time and location of the scan, and that either  Abigail Miyamoto RN or I will call with the results within  24-48 hours of receiving them. He has my card and contact information in the event he needs to speak with me, in addition to a copy of the power point we reviewed as a resource. He verbalized understanding of all of the above and had no further questions upon leaving the office.     I explained to the patient that there has been a high incidence of  coronary artery disease noted on these exams. I explained that this is a non-gated exam therefore degree or severity cannot be determined. This patient is on statin therapy. I  have asked the patient to follow-up with their PCP regarding any incidental finding of coronary artery disease and management with diet or medication as they feel is clinically indicated. The patient verbalized understanding of the above and had no further questions.   I spent 3 minutes counseling on smoking cessation and the health risks of continued tobacco abuse    Glenn [name] D. Tiburcio Pea, NP-C [facility] Personal contact information can be found on Amion  09/28/2021, 10:02 AM

## 2021-09-28 NOTE — Patient Instructions (Signed)
Thank you for participating in the [facility]. It was our pleasure to meet you today. We will call you with the results of your scan within the next few days. Your scan will be assigned a Lung RADS category score by the physicians reading the scans.  This Lung RADS score determines follow up scanning.  See below for description of categories, and follow up screening recommendations. We will be in touch to schedule your follow up screening annually or based on recommendations of our providers. We will fax a copy of your scan results to your Primary Care Physician, or the physician who referred you to the program, to ensure they have the results. Please call the office if you have any questions or concerns regarding your scanning experience or results.  Our office number is [phone]. Please speak with Denise Phelps, RN. , or  Denise Buckner RN, They are  our Lung Cancer Screening RN.'s If They are unavailable when you call, Please leave a message on the voice mail. We will return your call at our earliest convenience.This voice mail is monitored several times a day.  Remember, if your scan is normal, we will scan you annually as Ask as you continue to meet the criteria for the program. (Age 55-77, Current smoker or smoker who has quit within the last 15 years). If you are a smoker, remember, quitting is the single most powerful action that you can take to decrease your risk of lung cancer and other pulmonary, breathing related problems. We know quitting is hard, and we are here to help.  Please let us know if there is anything we can do to help you meet your goal of quitting. If you are a former smoker, congratulations. We are proud of you! Remain smoke free! Remember you can refer friends or family members through the number above.  We will screen them to make sure they meet criteria for the program. Thank you for helping us take better care of you by  participating in Lung Screening.  You can receive free nicotine replacement therapy ( patches, gum or mints) by calling 1-800-QUIT NOW. Please call so we can get you on the path to becoming  a non-smoker. I know it is hard, but you can do this!  Lung RADS Categories:  Lung RADS 1: no nodules or definitely non-concerning nodules.  Recommendation is for a repeat annual scan in 12 months.  Lung RADS 2:  nodules that are non-concerning in appearance and behavior with a very low likelihood of becoming an active cancer. Recommendation is for a repeat annual scan in 12 months.  Lung RADS 3: nodules that are probably non-concerning , includes nodules with a low likelihood of becoming an active cancer.  Recommendation is for a 6-month repeat screening scan. Often noted after an upper respiratory illness. We will be in touch to make sure you have no questions, and to schedule your 6-month scan.  Lung RADS 4 A: nodules with concerning findings, recommendation is most often for a follow up scan in 3 months or additional testing based on our provider's assessment of the scan. We will be in touch to make sure you have no questions and to schedule the recommended 3 month follow up scan.  Lung RADS 4 B:  indicates findings that are concerning. We will be in touch with you to schedule additional diagnostic testing based on our provider's  assessment of the scan.  Other options for assistance in smoking cessation (   As covered by your insurance benefits)  Hypnosis for smoking cessation  [employer] [phone]  Acupuncture for smoking cessation  [facility] [phone]   

## 2021-09-29 ENCOUNTER — Other Ambulatory Visit: Payer: Self-pay

## 2021-09-29 ENCOUNTER — Emergency Department (HOSPITAL_COMMUNITY): Payer: Medicaid Other

## 2021-09-29 ENCOUNTER — Encounter (HOSPITAL_COMMUNITY): Payer: Self-pay | Admitting: Emergency Medicine

## 2021-09-29 ENCOUNTER — Inpatient Hospital Stay (HOSPITAL_COMMUNITY)
Admission: EM | Admit: 2021-09-29 | Discharge: 2021-10-03 | DRG: 190 | Disposition: A | Discharge status: Home / Self Care | Payer: Medicaid Other | Attending: Internal Medicine | Admitting: Internal Medicine

## 2021-09-29 DIAGNOSIS — J9601 Acute respiratory failure with hypoxia: Secondary | ICD-10-CM | POA: Diagnosis present

## 2021-09-29 DIAGNOSIS — Z7984 Long term (current) use of oral hypoglycemic drugs: Secondary | ICD-10-CM

## 2021-09-29 DIAGNOSIS — Z8249 Family history of ischemic heart disease and other diseases of the circulatory system: Secondary | ICD-10-CM

## 2021-09-29 DIAGNOSIS — E1169 Type 2 diabetes mellitus with other specified complication: Secondary | ICD-10-CM | POA: Diagnosis present

## 2021-09-29 DIAGNOSIS — G894 Chronic pain syndrome: Secondary | ICD-10-CM | POA: Diagnosis present

## 2021-09-29 DIAGNOSIS — Z862 Personal history of diseases of the blood and blood-forming organs and certain disorders involving the immune mechanism: Secondary | ICD-10-CM

## 2021-09-29 DIAGNOSIS — I10 Essential (primary) hypertension: Secondary | ICD-10-CM | POA: Diagnosis present

## 2021-09-29 DIAGNOSIS — Z20822 Contact with and (suspected) exposure to covid-19: Secondary | ICD-10-CM | POA: Diagnosis present

## 2021-09-29 DIAGNOSIS — T380X5A Adverse effect of glucocorticoids and synthetic analogues, initial encounter: Secondary | ICD-10-CM | POA: Diagnosis present

## 2021-09-29 DIAGNOSIS — Z833 Family history of diabetes mellitus: Secondary | ICD-10-CM

## 2021-09-29 DIAGNOSIS — K219 Gastro-esophageal reflux disease without esophagitis: Secondary | ICD-10-CM | POA: Diagnosis present

## 2021-09-29 DIAGNOSIS — Z7951 Long term (current) use of inhaled steroids: Secondary | ICD-10-CM

## 2021-09-29 DIAGNOSIS — D869 Sarcoidosis, unspecified: Secondary | ICD-10-CM | POA: Diagnosis present

## 2021-09-29 DIAGNOSIS — I160 Hypertensive urgency: Secondary | ICD-10-CM | POA: Diagnosis present

## 2021-09-29 DIAGNOSIS — E785 Hyperlipidemia, unspecified: Secondary | ICD-10-CM | POA: Diagnosis present

## 2021-09-29 DIAGNOSIS — E1165 Type 2 diabetes mellitus with hyperglycemia: Secondary | ICD-10-CM | POA: Diagnosis present

## 2021-09-29 DIAGNOSIS — N62 Hypertrophy of breast: Secondary | ICD-10-CM | POA: Diagnosis present

## 2021-09-29 DIAGNOSIS — Z888 Allergy status to other drugs, medicaments and biological substances status: Secondary | ICD-10-CM

## 2021-09-29 DIAGNOSIS — J441 Chronic obstructive pulmonary disease with (acute) exacerbation: Principal | ICD-10-CM | POA: Diagnosis present

## 2021-09-29 DIAGNOSIS — Z79899 Other long term (current) drug therapy: Secondary | ICD-10-CM

## 2021-09-29 DIAGNOSIS — Z8673 Personal history of transient ischemic attack (TIA), and cerebral infarction without residual deficits: Secondary | ICD-10-CM

## 2021-09-29 DIAGNOSIS — Z91014 Allergy to mammalian meats: Secondary | ICD-10-CM

## 2021-09-29 DIAGNOSIS — G8929 Other chronic pain: Secondary | ICD-10-CM | POA: Diagnosis present

## 2021-09-29 DIAGNOSIS — E119 Type 2 diabetes mellitus without complications: Secondary | ICD-10-CM

## 2021-09-29 DIAGNOSIS — Z91013 Allergy to seafood: Secondary | ICD-10-CM

## 2021-09-29 DIAGNOSIS — Z87891 Personal history of nicotine dependence: Secondary | ICD-10-CM

## 2021-09-29 DIAGNOSIS — I152 Hypertension secondary to endocrine disorders: Secondary | ICD-10-CM | POA: Diagnosis present

## 2021-09-29 DIAGNOSIS — F419 Anxiety disorder, unspecified: Secondary | ICD-10-CM | POA: Diagnosis present

## 2021-09-29 NOTE — ED Triage Notes (Signed)
Pt c/o chest pain, shortness of breath and cough x 2 days.

## 2021-09-30 ENCOUNTER — Ambulatory Visit (HOSPITAL_BASED_OUTPATIENT_CLINIC_OR_DEPARTMENT_OTHER): Admission: RE | Admit: 2021-09-30 | Payer: Medicaid Other | Source: Ambulatory Visit

## 2021-09-30 ENCOUNTER — Encounter (HOSPITAL_COMMUNITY): Payer: Self-pay | Admitting: Internal Medicine

## 2021-09-30 DIAGNOSIS — Z91013 Allergy to seafood: Secondary | ICD-10-CM | POA: Diagnosis not present

## 2021-09-30 DIAGNOSIS — Z87891 Personal history of nicotine dependence: Secondary | ICD-10-CM | POA: Diagnosis not present

## 2021-09-30 DIAGNOSIS — J9601 Acute respiratory failure with hypoxia: Secondary | ICD-10-CM | POA: Diagnosis present

## 2021-09-30 DIAGNOSIS — J441 Chronic obstructive pulmonary disease with (acute) exacerbation: Secondary | ICD-10-CM | POA: Diagnosis not present

## 2021-09-30 DIAGNOSIS — G8929 Other chronic pain: Secondary | ICD-10-CM

## 2021-09-30 DIAGNOSIS — I10 Essential (primary) hypertension: Secondary | ICD-10-CM | POA: Diagnosis present

## 2021-09-30 DIAGNOSIS — E119 Type 2 diabetes mellitus without complications: Secondary | ICD-10-CM | POA: Diagnosis not present

## 2021-09-30 DIAGNOSIS — Z8673 Personal history of transient ischemic attack (TIA), and cerebral infarction without residual deficits: Secondary | ICD-10-CM | POA: Diagnosis not present

## 2021-09-30 DIAGNOSIS — G894 Chronic pain syndrome: Secondary | ICD-10-CM | POA: Diagnosis present

## 2021-09-30 DIAGNOSIS — N62 Hypertrophy of breast: Secondary | ICD-10-CM | POA: Diagnosis present

## 2021-09-30 DIAGNOSIS — K219 Gastro-esophageal reflux disease without esophagitis: Secondary | ICD-10-CM | POA: Diagnosis present

## 2021-09-30 DIAGNOSIS — I160 Hypertensive urgency: Secondary | ICD-10-CM | POA: Diagnosis present

## 2021-09-30 DIAGNOSIS — Z79899 Other long term (current) drug therapy: Secondary | ICD-10-CM | POA: Diagnosis not present

## 2021-09-30 DIAGNOSIS — E785 Hyperlipidemia, unspecified: Secondary | ICD-10-CM | POA: Diagnosis present

## 2021-09-30 DIAGNOSIS — T380X5A Adverse effect of glucocorticoids and synthetic analogues, initial encounter: Secondary | ICD-10-CM | POA: Diagnosis present

## 2021-09-30 DIAGNOSIS — Z7951 Long term (current) use of inhaled steroids: Secondary | ICD-10-CM | POA: Diagnosis not present

## 2021-09-30 DIAGNOSIS — Z7984 Long term (current) use of oral hypoglycemic drugs: Secondary | ICD-10-CM | POA: Diagnosis not present

## 2021-09-30 DIAGNOSIS — J449 Chronic obstructive pulmonary disease, unspecified: Secondary | ICD-10-CM | POA: Diagnosis not present

## 2021-09-30 DIAGNOSIS — G8921 Chronic pain due to trauma: Secondary | ICD-10-CM | POA: Diagnosis not present

## 2021-09-30 DIAGNOSIS — Z91014 Allergy to mammalian meats: Secondary | ICD-10-CM | POA: Diagnosis not present

## 2021-09-30 DIAGNOSIS — D869 Sarcoidosis, unspecified: Secondary | ICD-10-CM | POA: Diagnosis present

## 2021-09-30 DIAGNOSIS — E1165 Type 2 diabetes mellitus with hyperglycemia: Secondary | ICD-10-CM | POA: Diagnosis present

## 2021-09-30 DIAGNOSIS — Z888 Allergy status to other drugs, medicaments and biological substances status: Secondary | ICD-10-CM | POA: Diagnosis not present

## 2021-09-30 DIAGNOSIS — Z833 Family history of diabetes mellitus: Secondary | ICD-10-CM | POA: Diagnosis not present

## 2021-09-30 DIAGNOSIS — F419 Anxiety disorder, unspecified: Secondary | ICD-10-CM | POA: Diagnosis present

## 2021-09-30 DIAGNOSIS — Z862 Personal history of diseases of the blood and blood-forming organs and certain disorders involving the immune mechanism: Secondary | ICD-10-CM

## 2021-09-30 DIAGNOSIS — Z20822 Contact with and (suspected) exposure to covid-19: Secondary | ICD-10-CM | POA: Diagnosis present

## 2021-09-30 DIAGNOSIS — Z8249 Family history of ischemic heart disease and other diseases of the circulatory system: Secondary | ICD-10-CM | POA: Diagnosis not present

## 2021-09-30 DIAGNOSIS — E1169 Type 2 diabetes mellitus with other specified complication: Secondary | ICD-10-CM | POA: Diagnosis present

## 2021-09-30 LAB — CBC
HCT: 42.4 % (ref 39.0–52.0)
Hemoglobin: 13.6 g/dL (ref 13.0–17.0)
MCH: 28.4 pg (ref 26.0–34.0)
MCHC: 32.1 g/dL (ref 30.0–36.0)
MCV: 88.5 fL (ref 80.0–100.0)
Platelets: 225 10*3/uL (ref 150–400)
RBC: 4.79 MIL/uL (ref 4.22–5.81)
RDW: 13.5 % (ref 11.5–15.5)
WBC: 7.8 10*3/uL (ref 4.0–10.5)
nRBC: 0 % (ref 0.0–0.2)

## 2021-09-30 LAB — RESP PANEL BY RT-PCR (FLU A&B, COVID) ARPGX2
Influenza A by PCR: NEGATIVE
Influenza B by PCR: NEGATIVE
SARS Coronavirus 2 by RT PCR: NEGATIVE

## 2021-09-30 LAB — BASIC METABOLIC PANEL
Anion gap: 6 (ref 5–15)
BUN: 14 mg/dL (ref 8–23)
CO2: 28 mmol/L (ref 22–32)
Calcium: 9.3 mg/dL (ref 8.9–10.3)
Chloride: 105 mmol/L (ref 98–111)
Creatinine, Ser: 0.85 mg/dL (ref 0.61–1.24)
GFR, Estimated: >60 mL/min (ref >60)
Glucose, Bld: 113 mg/dL — ABNORMAL HIGH (ref 70–99)
Potassium: 3.6 mmol/L (ref 3.5–5.1)
Sodium: 139 mmol/L (ref 135–145)

## 2021-09-30 LAB — I-STAT VENOUS BLOOD GAS, ED
Acid-Base Excess: 4 mmol/L — ABNORMAL HIGH (ref 0.0–2.0)
Bicarbonate: 30.2 mmol/L — ABNORMAL HIGH (ref 20.0–28.0)
Calcium, Ion: 1.15 mmol/L (ref 1.15–1.40)
HCT: 44 % (ref 39.0–52.0)
Hemoglobin: 15 g/dL (ref 13.0–17.0)
O2 Saturation: 99 %
Potassium: 3.6 mmol/L (ref 3.5–5.1)
Sodium: 139 mmol/L (ref 135–145)
TCO2: 32 mmol/L (ref 22–32)
pCO2, Ven: 50.1 mmHg (ref 44–60)
pH, Ven: 7.388 (ref 7.25–7.43)
pO2, Ven: 151 mmHg — ABNORMAL HIGH (ref 32–45)

## 2021-09-30 LAB — GLUCOSE, CAPILLARY: Glucose-Capillary: 174 mg/dL — ABNORMAL HIGH (ref 70–99)

## 2021-09-30 LAB — TROPONIN I (HIGH SENSITIVITY)
Troponin I (High Sensitivity): 10 ng/L (ref <18)
Troponin I (High Sensitivity): 10 ng/L (ref <18)

## 2021-09-30 LAB — BRAIN NATRIURETIC PEPTIDE: B Natriuretic Peptide: 38.4 pg/mL (ref 0.0–100.0)

## 2021-09-30 LAB — CBG MONITORING, ED: Glucose-Capillary: 236 mg/dL — ABNORMAL HIGH (ref 70–99)

## 2021-09-30 MED ORDER — ATORVASTATIN CALCIUM 40 MG PO TABS
40.0000 mg | ORAL_TABLET | Freq: Every day | ORAL | Status: DC
  Administered 2021-09-30 – 2021-10-03 (×4): 40 mg via ORAL
  Filled 2021-09-30 (×4): qty 1

## 2021-09-30 MED ORDER — ONDANSETRON HCL 4 MG PO TABS
4.0000 mg | ORAL_TABLET | Freq: Four times a day (QID) | ORAL | Status: DC | PRN
  Administered 2021-09-30: 4 mg via ORAL
  Filled 2021-09-30: qty 1

## 2021-09-30 MED ORDER — HYDROCHLOROTHIAZIDE 12.5 MG PO TABS
12.5000 mg | ORAL_TABLET | Freq: Every day | ORAL | Status: DC
  Administered 2021-09-30 – 2021-10-03 (×3): 12.5 mg via ORAL
  Filled 2021-09-30 (×4): qty 1

## 2021-09-30 MED ORDER — MAGNESIUM SULFATE 2 GM/50ML IV SOLN
2.0000 g | Freq: Once | INTRAVENOUS | Status: AC
  Administered 2021-09-30: 2 g via INTRAVENOUS
  Filled 2021-09-30: qty 50

## 2021-09-30 MED ORDER — BUDESONIDE 0.5 MG/2ML IN SUSP
0.5000 mg | Freq: Two times a day (BID) | RESPIRATORY_TRACT | Status: DC
  Administered 2021-09-30 – 2021-10-03 (×6): 0.5 mg via RESPIRATORY_TRACT
  Filled 2021-09-30 (×7): qty 2

## 2021-09-30 MED ORDER — MOMETASONE FURO-FORMOTEROL FUM 200-5 MCG/ACT IN AERO
2.0000 | INHALATION_SPRAY | Freq: Two times a day (BID) | RESPIRATORY_TRACT | Status: DC
  Filled 2021-09-30: qty 8.8

## 2021-09-30 MED ORDER — LOSARTAN POTASSIUM-HCTZ 100-12.5 MG PO TABS: 1.0000 | ORAL_TABLET | Freq: Every day | ORAL | Status: DC

## 2021-09-30 MED ORDER — METHYLPREDNISOLONE SODIUM SUCC 125 MG IJ SOLR
125.0000 mg | Freq: Once | INTRAMUSCULAR | Status: AC
  Administered 2021-09-30: 125 mg via INTRAVENOUS
  Filled 2021-09-30: qty 2

## 2021-09-30 MED ORDER — AZITHROMYCIN 500 MG PO TABS
500.0000 mg | ORAL_TABLET | Freq: Every day | ORAL | Status: DC
  Administered 2021-10-01 – 2021-10-03 (×3): 500 mg via ORAL
  Filled 2021-09-30 (×3): qty 1

## 2021-09-30 MED ORDER — GABAPENTIN 300 MG PO CAPS
600.0000 mg | ORAL_CAPSULE | Freq: Every day | ORAL | Status: DC
  Administered 2021-10-01: 600 mg via ORAL
  Filled 2021-09-30 (×4): qty 2

## 2021-09-30 MED ORDER — INSULIN ASPART 100 UNIT/ML IJ SOLN: 0.0000 [IU] | Freq: Three times a day (TID) | INTRAMUSCULAR | Status: DC

## 2021-09-30 MED ORDER — ACETAMINOPHEN 325 MG PO TABS: 650.0000 mg | ORAL_TABLET | Freq: Four times a day (QID) | ORAL | Status: DC | PRN

## 2021-09-30 MED ORDER — IPRATROPIUM-ALBUTEROL 0.5-2.5 (3) MG/3ML IN SOLN
3.0000 mL | Freq: Four times a day (QID) | RESPIRATORY_TRACT | Status: DC
  Administered 2021-09-30 – 2021-10-01 (×5): 3 mL via RESPIRATORY_TRACT
  Filled 2021-09-30 (×5): qty 3

## 2021-09-30 MED ORDER — METHYLPREDNISOLONE SODIUM SUCC 125 MG IJ SOLR
80.0000 mg | Freq: Once | INTRAMUSCULAR | Status: AC
  Administered 2021-09-30: 80 mg via INTRAVENOUS
  Filled 2021-09-30: qty 2

## 2021-09-30 MED ORDER — PANTOPRAZOLE SODIUM 40 MG PO TBEC
40.0000 mg | DELAYED_RELEASE_TABLET | Freq: Every day | ORAL | Status: DC
  Administered 2021-09-30 – 2021-10-03 (×4): 40 mg via ORAL
  Filled 2021-09-30 (×4): qty 1

## 2021-09-30 MED ORDER — IPRATROPIUM BROMIDE 0.02 % IN SOLN
1.0000 mg | Freq: Once | RESPIRATORY_TRACT | Status: AC
  Administered 2021-09-30: 1 mg via RESPIRATORY_TRACT
  Filled 2021-09-30: qty 5

## 2021-09-30 MED ORDER — MONTELUKAST SODIUM 10 MG PO TABS: 10.0000 mg | ORAL_TABLET | Freq: Every evening | ORAL | Status: DC | PRN

## 2021-09-30 MED ORDER — DULOXETINE HCL 30 MG PO CPEP
30.0000 mg | ORAL_CAPSULE | Freq: Every day | ORAL | Status: DC
  Administered 2021-10-01: 30 mg via ORAL
  Filled 2021-09-30 (×3): qty 1

## 2021-09-30 MED ORDER — NITROGLYCERIN 0.4 MG SL SUBL
0.4000 mg | SUBLINGUAL_TABLET | SUBLINGUAL | Status: DC | PRN
Start: 2021-09-30 — End: 2021-10-03

## 2021-09-30 MED ORDER — ENOXAPARIN SODIUM 40 MG/0.4ML IJ SOSY: 40.0000 mg | PREFILLED_SYRINGE | Freq: Every day | INTRAMUSCULAR | Status: DC

## 2021-09-30 MED ORDER — OXYCODONE HCL 5 MG PO TABS
20.0000 mg | ORAL_TABLET | Freq: Once | ORAL | Status: AC
  Administered 2021-09-30: 20 mg via ORAL
  Filled 2021-09-30: qty 4

## 2021-09-30 MED ORDER — MONTELUKAST SODIUM 10 MG PO TABS: 10.0000 mg | ORAL_TABLET | ORAL | Status: DC | PRN

## 2021-09-30 MED ORDER — ALBUTEROL SULFATE (2.5 MG/3ML) 0.083% IN NEBU
10.0000 mg | INHALATION_SOLUTION | Freq: Once | RESPIRATORY_TRACT | Status: AC
  Administered 2021-09-30: 10 mg via RESPIRATORY_TRACT
  Filled 2021-09-30: qty 12

## 2021-09-30 MED ORDER — ALBUTEROL SULFATE (2.5 MG/3ML) 0.083% IN NEBU
10.0000 mg | INHALATION_SOLUTION | Freq: Once | RESPIRATORY_TRACT | Status: AC
Start: 2021-09-30 — End: 2021-09-30
  Administered 2021-09-30: 10 mg via RESPIRATORY_TRACT
  Filled 2021-09-30: qty 12

## 2021-09-30 MED ORDER — LORATADINE 10 MG PO TABS: 10.0000 mg | ORAL_TABLET | Freq: Every day | ORAL | Status: DC | PRN

## 2021-09-30 MED ORDER — IPRATROPIUM-ALBUTEROL 0.5-2.5 (3) MG/3ML IN SOLN
3.0000 mL | Freq: Four times a day (QID) | RESPIRATORY_TRACT | Status: DC
  Filled 2021-09-30: qty 3

## 2021-09-30 MED ORDER — GUAIFENESIN ER 600 MG PO TB12
600.0000 mg | ORAL_TABLET | Freq: Two times a day (BID) | ORAL | Status: DC
  Administered 2021-09-30 (×2): 600 mg via ORAL
  Filled 2021-09-30 (×2): qty 1

## 2021-09-30 MED ORDER — AMLODIPINE BESYLATE 10 MG PO TABS
10.0000 mg | ORAL_TABLET | Freq: Every day | ORAL | Status: DC
  Administered 2021-09-30 – 2021-10-03 (×3): 10 mg via ORAL
  Filled 2021-09-30 (×2): qty 1
  Filled 2021-09-30: qty 2
  Filled 2021-09-30: qty 1

## 2021-09-30 MED ORDER — OXYCODONE HCL 5 MG PO TABS
20.0000 mg | ORAL_TABLET | Freq: Four times a day (QID) | ORAL | Status: DC | PRN
  Administered 2021-09-30 – 2021-10-03 (×10): 20 mg via ORAL
  Filled 2021-09-30 (×11): qty 4

## 2021-09-30 MED ORDER — INSULIN ASPART 100 UNIT/ML IJ SOLN: 0.0000 [IU] | Freq: Every day | INTRAMUSCULAR | Status: DC

## 2021-09-30 MED ORDER — PREDNISONE 20 MG PO TABS
40.0000 mg | ORAL_TABLET | Freq: Every day | ORAL | Status: DC
  Filled 2021-09-30: qty 2

## 2021-09-30 MED ORDER — ONDANSETRON HCL 4 MG/2ML IJ SOLN: 4.0000 mg | Freq: Four times a day (QID) | INTRAMUSCULAR | Status: DC | PRN

## 2021-09-30 MED ORDER — SODIUM CHLORIDE 0.9 % IV SOLN
500.0000 mg | INTRAVENOUS | Status: AC
  Administered 2021-09-30: 500 mg via INTRAVENOUS
  Filled 2021-09-30: qty 5

## 2021-09-30 MED ORDER — HYDROXYZINE PAMOATE 50 MG PO CAPS: 50.0000 mg | ORAL_CAPSULE | Freq: Every day | ORAL | Status: DC

## 2021-09-30 MED ORDER — CLOPIDOGREL BISULFATE 75 MG PO TABS
75.0000 mg | ORAL_TABLET | Freq: Every day | ORAL | Status: DC
  Administered 2021-09-30 – 2021-10-03 (×4): 75 mg via ORAL
  Filled 2021-09-30 (×4): qty 1

## 2021-09-30 MED ORDER — LOSARTAN POTASSIUM 50 MG PO TABS
100.0000 mg | ORAL_TABLET | Freq: Every day | ORAL | Status: DC
  Administered 2021-09-30 – 2021-10-03 (×4): 100 mg via ORAL
  Filled 2021-09-30 (×4): qty 2

## 2021-09-30 MED ORDER — ALBUTEROL SULFATE (2.5 MG/3ML) 0.083% IN NEBU: 2.5000 mg | INHALATION_SOLUTION | RESPIRATORY_TRACT | Status: DC | PRN

## 2021-09-30 MED ORDER — ARFORMOTEROL TARTRATE 15 MCG/2ML IN NEBU
15.0000 ug | INHALATION_SOLUTION | Freq: Two times a day (BID) | RESPIRATORY_TRACT | Status: DC
  Administered 2021-09-30 – 2021-10-03 (×6): 15 ug via RESPIRATORY_TRACT
  Filled 2021-09-30 (×7): qty 2

## 2021-09-30 MED ORDER — NALOXONE HCL 4 MG/0.1ML NA LIQD: 0.4000 mg | NASAL | Status: DC | PRN

## 2021-09-30 MED ORDER — TAMSULOSIN HCL 0.4 MG PO CAPS
0.4000 mg | ORAL_CAPSULE | Freq: Every day | ORAL | Status: DC
  Administered 2021-10-01 – 2021-10-03 (×3): 0.4 mg via ORAL
  Filled 2021-09-30 (×4): qty 1

## 2021-09-30 MED ORDER — ENOXAPARIN SODIUM 40 MG/0.4ML IJ SOSY
40.0000 mg | PREFILLED_SYRINGE | INTRAMUSCULAR | Status: DC
  Administered 2021-09-30 – 2021-10-03 (×4): 40 mg via SUBCUTANEOUS
  Filled 2021-09-30 (×4): qty 0.4

## 2021-09-30 MED ORDER — HYDROCHLOROTHIAZIDE 12.5 MG PO TABS: 12.5000 mg | ORAL_TABLET | Freq: Every day | ORAL | Status: DC | PRN

## 2021-09-30 MED ORDER — LUBIPROSTONE 24 MCG PO CAPS
24.0000 ug | ORAL_CAPSULE | Freq: Every day | ORAL | Status: DC | PRN
  Filled 2021-09-30: qty 1

## 2021-09-30 MED ORDER — ACETAMINOPHEN 650 MG RE SUPP: 650.0000 mg | Freq: Four times a day (QID) | RECTAL | Status: DC | PRN

## 2021-09-30 MED ORDER — HYDROXYZINE HCL 25 MG PO TABS
50.0000 mg | ORAL_TABLET | Freq: Every day | ORAL | Status: DC
  Administered 2021-09-30 – 2021-10-02 (×3): 50 mg via ORAL
  Filled 2021-09-30 (×3): qty 2

## 2021-09-30 MED ORDER — NICOTINE 21 MG/24HR TD PT24
21.0000 mg | MEDICATED_PATCH | Freq: Every day | TRANSDERMAL | Status: DC
  Filled 2021-09-30 (×2): qty 1

## 2021-09-30 NOTE — Plan of Care (Signed)
  Problem: Education: Goal: Knowledge of disease or condition will improve Outcome: Progressing   Problem: Activity: Goal: Ability to tolerate increased activity will improve Outcome: Progressing   Problem: Respiratory: Goal: Ability to maintain adequate ventilation will improve Outcome: Progressing   

## 2021-09-30 NOTE — ED Provider Notes (Signed)
[facility] Provider Note   CSN: [id number] Arrival date & time: 09/29/21  2337     History  Chief Complaint  Patient presents with   Chest Pain   Shortness of Breath    Glenn [name] is a 61 y.o. male.  With a past medical history of asthma, COPD, sarcoidosis, diabetes who presents to the emergency department with respiratory distress.  Patient initially came in with some difficulty breathing neck.  Over the past 2 hours waiting in triage she has had worsening shortness of breath now tachycardic to 115 with oxygen saturations at 90% and placed on oxygen.  Patient has had progressively worsening shortness of breath over the past 2 weeks culminating in severe shortness of breath tonight.  He has associated cough, difficulty moving air.  Feels like previous COPD exacerbations.  He denies fever or chills.  His doctor placed him on prednisone and antibiotics 2 days ago without any improvement.  History of previous hospitalizations for respiratory distress and COPD exacerbation.  He denies a previous history of intubation.   Chest Pain Associated symptoms: shortness of breath   Shortness of Breath Associated symptoms: chest pain        Home Medications Prior to Admission medications   Medication Sig Start Date End Date Taking? Authorizing Provider  ACCU-CHEK GUIDE test strip TEST BLOOD GLUCOSE DAILY 04/14/21   [provider]  Accu-Chek Softclix Lancets lancets SMARTSIG:Topical Once a Month 04/18/21   [provider]  albuterol (PROAIR HFA) 108 (90 Base) MCG/ACT inhaler 2 puffs every 4 hours as needed only  if your can't catch your breath Patient taking differently: 2 puffs in the morning, at noon, in the evening, and at bedtime. 11/11/20   Nyoka CowdenWert, Michael B, MD  albuterol (PROVENTIL) (2.5 MG/3ML) 0.083% nebulizer solution Take 3 mLs (2.5 mg total) by nebulization every 4 (four) hours as needed for wheezing or shortness of breath.  08/16/21   Nyoka CowdenWert, Michael B, MD  AMITIZA 24 MCG capsule Take 24 mcg by mouth daily as needed for constipation. 03/31/20   [provider]  amLODipine (NORVASC) 10 MG tablet Take 10 mg by mouth daily.    [provider]  Ascorbic Acid (VITAMIN C PO) Take 2 tablets by mouth daily.    [provider]  atorvastatin (LIPITOR) 40 MG tablet Take 1 tablet (40 mg total) by mouth daily. 06/26/20   Pokhrel, Rebekah ChesterfieldLaxman, MD  budesonide-formoterol (SYMBICORT) 160-4.5 MCG/ACT inhaler Inhale 2 puffs into the lungs 2 (two) times daily. 07/23/21   Nyoka CowdenWert, Michael B, MD  cetirizine (ZYRTEC ALLERGY) 10 MG tablet Take 1 tablet (10 mg total) by mouth at bedtime. Patient taking differently: Take 10 mg by mouth daily as needed for allergies. 06/09/20   Parrett, Virgel Bouquetammy S, NP  diclofenac Sodium (VOLTAREN) 1 % GEL Apply 1 application. topically daily as needed for pain. 02/09/21   [provider]  DULoxetine (CYMBALTA) 60 MG capsule Take 60 mg by mouth daily.    [provider]  ferrous sulfate 325 (65 FE) MG EC tablet Take 325 mg by mouth daily with breakfast.    [provider]  gabapentin (NEURONTIN) 300 MG capsule Take 1 capsule (300 mg total) by mouth 3 (three) times daily. Patient taking differently: Take 300 mg by mouth 3 (three) times daily as needed (siactic nerve). 02/09/18   Eustace MooreNelson, Yvonne Sue, MD  hydrochlorothiazide (HYDRODIURIL) 12.5 MG tablet Take 12.5 mg by mouth daily as needed (fluid). 03/26/20  [provider]  hydrOXYzine (VISTARIL) 50 MG capsule Take 50 mg by mouth at bedtime. 02/10/21   [provider]  metFORMIN (GLUCOPHAGE) 500 MG tablet Take 500 mg by mouth daily with breakfast. 04/17/20   [provider]  montelukast (SINGULAIR) 10 MG tablet TAKE 1 TABLET BY MOUTH EVERYDAY AT BEDTIME 05/11/21   Parrett, Tammy S, NP  nitroGLYCERIN (NITROSTAT) 0.4 MG SL tablet Place 0.4 mg under the tongue every 5 (five) minutes as needed for chest pain.     [provider]  Omega-3 Fatty Acids (FISH OIL TRIPLE STRENGTH PO) Take 1,000 mg by mouth daily.    [provider]  Oxycodone HCl 20 MG TABS Take 20 mg by mouth in the morning, at noon, in the evening, and at bedtime. 03/31/20   [provider]  pantoprazole (PROTONIX) 40 MG tablet Take 1 tablet (40 mg total) by mouth daily. 07/02/19   Elgergawy, Leana Roe, MD  sildenafil (VIAGRA) 100 MG tablet Take 1 tablet (100 mg total) by mouth daily as needed. 05/24/21     sildenafil (VIAGRA) 100 MG tablet Take 1 tablet (100 mg total) by mouth daily as needed. 06/03/21   Fleet Contras, MD  tamsulosin (FLOMAX) 0.4 MG CAPS capsule Take 1 capsule by mouth daily as needed (prostate). 04/26/21   [provider]  Tiotropium Bromide Monohydrate (SPIRIVA RESPIMAT) 2.5 MCG/ACT AERS Inhale 1 puff into the lungs every 12 (twelve) hours. 05/12/21   Luciano Cutter, MD  vitamin B-12 1000 MCG tablet Take 1 tablet (1,000 mcg total) by mouth daily. 05/10/20   Briant Cedar, MD  Vitamin D, Ergocalciferol, (DRISDOL) 1.25 MG (50000 UNIT) CAPS capsule Take 50,000 Units by mouth once a week. Mondays 05/02/19   [provider]  XTAMPZA ER 9 MG C12A Take 1 capsule by mouth 2 (two) times daily. 04/09/21   [provider]      Allergies    Insulins, Ibuprofen, Shrimp [shellfish allergy], Tylenol [acetaminophen], and Lisinopril    Review of Systems   Review of Systems  Respiratory:  Positive for shortness of breath.   Cardiovascular:  Positive for chest pain.    Physical Exam Updated Vital Signs BP (!) 139/108 (BP Location: Right Arm)   Pulse (!) 106   Temp 97.7 F (36.5 C) (Oral)   Resp (!) 24   SpO2 95%  Physical Exam Vitals and nursing note reviewed.  Constitutional:      General: He is not in acute distress.    Appearance: He is well-developed. He is ill-appearing. He is not diaphoretic.  HENT:     Head: Normocephalic and atraumatic.  Eyes:     General: No  scleral icterus.    Conjunctiva/sclera: Conjunctivae normal.  Cardiovascular:     Rate and Rhythm: Normal rate and regular rhythm.     Heart sounds: Normal heart sounds.  Pulmonary:     Effort: Tachypnea, accessory muscle usage and prolonged expiration present. No respiratory distress.     Breath sounds: Decreased air movement present. Decreased breath sounds and wheezing present.  Abdominal:     Palpations: Abdomen is soft.     Tenderness: There is no abdominal tenderness.  Musculoskeletal:     Cervical back: Normal range of motion and neck supple.     Right lower leg: No edema.     Left lower leg: No edema.  Skin:    General: Skin is warm and dry.  Neurological:     Mental Status: He is  alert.  Psychiatric:        Behavior: Behavior normal.     ED Results / Procedures / Treatments   Labs (all labs ordered are listed, but only abnormal results are displayed) Labs Reviewed  BASIC METABOLIC PANEL - Abnormal; Notable for the following components:      Result Value   Glucose, Bld 113 (*)    All other components within normal limits  I-STAT VENOUS BLOOD GAS, ED - Abnormal; Notable for the following components:   pO2, Ven 151 (*)    Bicarbonate 30.2 (*)    Acid-Base Excess 4.0 (*)    All other components within normal limits  RESP PANEL BY RT-PCR (FLU A&B, COVID) ARPGX2  CBC  BRAIN NATRIURETIC PEPTIDE  CBC  CREATININE, SERUM  TROPONIN I (HIGH SENSITIVITY)  TROPONIN I (HIGH SENSITIVITY)    EKG EKG Interpretation  Date/Time:  Wednesday September 29 2021 23:49:36 EDT Ventricular Rate:  93 PR Interval:  126 QRS Duration: 82 QT Interval:  358 QTC Calculation: 445 R Axis:   28 Text Interpretation: Normal sinus rhythm Nonspecific T wave abnormality Abnormal ECG When compared with ECG of 23-May-2021 16:47, No significant change was found Confirmed by Gilda Crease [phone]) on 09/30/2021 2:27:55 AM  Radiology DG Chest 2 View  Result Date: 09/30/2021 CLINICAL DATA:   Chest pain, dyspnea EXAM: CHEST - 2 VIEW COMPARISON:  08/16/2021 FINDINGS: Stable elevation of the left hemidiaphragm. Partial left lung resection again noted with left-sided volume loss. No superimposed confluent pulmonary infiltrate. No pneumothorax or pleural effusion. Cardiac size within normal limits. Central pulmonary arterial enlargement is again noted in keeping with changes of pulmonary arterial hypertension. No acute bone abnormality. IMPRESSION: 1. No radiographic evidence of acute cardiopulmonary disease. 2. Stable changes of partial left lung resection. 3. Central pulmonary arterial enlargement Electronically Signed   By: Helyn Numbers M.D.   On: 09/30/2021 00:22    Procedures Procedures    Medications Ordered in ED Medications  amLODipine (NORVASC) tablet 10 mg (has no administration in time range)  atorvastatin (LIPITOR) tablet 40 mg (has no administration in time range)  hydrochlorothiazide (HYDRODIURIL) tablet 12.5 mg (has no administration in time range)  nitroGLYCERIN (NITROSTAT) SL tablet 0.4 mg (has no administration in time range)  mometasone-formoterol (DULERA) 200-5 MCG/ACT inhaler 2 puff (has no administration in time range)  enoxaparin (LOVENOX) injection 40 mg (has no administration in time range)  acetaminophen (TYLENOL) tablet 650 mg (has no administration in time range)    Or  acetaminophen (TYLENOL) suppository 650 mg (has no administration in time range)  ondansetron (ZOFRAN) tablet 4 mg (has no administration in time range)    Or  ondansetron (ZOFRAN) injection 4 mg (has no administration in time range)  methylPREDNISolone sodium succinate (SOLU-MEDROL) 125 mg/2 mL injection 80 mg (has no administration in time range)    Followed by  predniSONE (DELTASONE) tablet 40 mg (has no administration in time range)  ipratropium-albuterol (DUONEB) 0.5-2.5 (3) MG/3ML nebulizer solution 3 mL (has no administration in time range)  albuterol (PROVENTIL) (2.5 MG/3ML) 0.083%  nebulizer solution 2.5 mg (has no administration in time range)  insulin aspart (novoLOG) injection 0-20 Units (has no administration in time range)  insulin aspart (novoLOG) injection 0-5 Units (has no administration in time range)  methylPREDNISolone sodium succinate (SOLU-MEDROL) 125 mg/2 mL injection 125 mg (125 mg Intravenous Given 09/30/21 0215)  magnesium sulfate IVPB 2 g 50 mL (0 g Intravenous Stopped 09/30/21 0402)  albuterol (PROVENTIL) (2.5 MG/3ML) 0.083%  nebulizer solution 10 mg (10 mg Nebulization Given 09/30/21 0222)  ipratropium (ATROVENT) nebulizer solution 1 mg (1 mg Nebulization Given 09/30/21 0223)  albuterol (PROVENTIL) (2.5 MG/3ML) 0.083% nebulizer solution 10 mg (10 mg Nebulization Given 09/30/21 0408)    ED Course/ Medical Decision Making/ A&P Clinical Course as of 09/30/21 0652  Thu Sep 30, 2021  0314 Patient now speaking in full sentences. Still having increased WOB and prolonged expiratory phase. [AH]  0405 Patient 02 sat 88% on RA - placed  back on 2L. Will order repeat hour Canizalez neb  [AH]    Clinical Course User Index [AH] Arthor Captain, PA-C                           Medical Decision Making This patient presents to the ED for concern of sob, this involves an extensive number of treatment options, and is a complaint that carries with it a high risk of complications and morbidity.  The differential diagnosis includes The emergent differential diagnosis for shortness of breath includes, but is not limited to, Pulmonary edema, bronchoconstriction, Pneumonia, Pulmonary embolism, Pneumotherax/ Hemothorax, Dysrythmia, ACS.    Co morbidities that complicate the patient evaluation       copd, asthma, sarcoidosis   Additional history obtained:  Additional history obtained from emr, wife at bedside    Lab Tests:  I Ordered, and personally interpreted labs.  The pertinent results include:    Mildly elevated blood glucose of insignificant value, VBG shows  compensated chronic hypoxic respiratory failure Respiratory panel negative for COVID or influenza  Imaging Studies ordered:  I ordered imaging studies including chest x-ray I independently visualized and interpreted imaging which showed no evidence of edema or infiltrate I agree with the radiologist interpretation   Cardiac Monitoring:       The patient was maintained on a cardiac monitor.  I personally viewed and interpreted the cardiac monitored which showed an underlying rhythm of: Highness rhythm at a rate of 93   Medicines ordered and prescription drug management:  I ordered medication including hour-Meras albuterol treatment, Atrovent, magnesium, IV Solu-Medrol for respiratory failure Reevaluation of the patient after these medicines showed that the patient improved I have reviewed the patients home medicines and have made adjustments as needed   Test Considered:       CT angiogram low suspicion for PE   Critical Interventions:       Multiple rounds of hour-Bigos neb treatment   Consultations Obtained:  Case discussed with Dr. Para March of the hospitalist service as patient continues to have new oxygen deficit dropped to 85% on room air with ambulation now on 2 L  Problem List / ED Course:       COPD exacerbation with hypoxic respiratory failure   Reevaluation:  After the interventions noted above, I reevaluated the patient and found that they have :improved   Social Determinants of Health:       Patient insured with good outpatient follow-up   Dispostion:  After consideration of the diagnostic results and the patients response to treatment, I feel that the patent would benefit from admission.       Amount and/or Complexity of Data Reviewed Labs: ordered. Radiology: ordered.  Risk Prescription drug management. Decision regarding hospitalization.    Final Clinical Impression(s) / ED Diagnoses Final diagnoses:  COPD exacerbation (HCC)   Acute respiratory failure with hypoxia (HCC)    Rx / DC Orders ED Discharge Orders  None         Arthor Captain, PA-C 09/30/21 0654    Maia Plan, MD 10/01/21 [phone]

## 2021-09-30 NOTE — Progress Notes (Signed)
SATURATION QUALIFICATIONS: (This note is used to comply with regulatory documentation for home oxygen)  Patient Saturations on Room Air at Rest = 94%  Patient Saturations on Room Air while Ambulating = 90%    Please briefly explain why patient needs home oxygen: Pt did not require supplemental O2 to maintain adequate oxygen sats.   Farley Ly, PT, DPT  Acute Rehabilitation Services  Office: [phone]

## 2021-09-30 NOTE — H&P (Signed)
History and Physical    Patient: Glenn [name] [id number] DOB: 08/05/1960 DOA: 09/29/2021 DOS: the patient was seen and examined on 09/30/2021 PCP: Fleet Contras, MD  Patient coming from: Home  Chief Complaint:  Chief Complaint  Patient presents with   Chest Pain   Shortness of Breath   HPI: Glenn [name] is a 61 y.o. male with medical history significant of hypertension, hyperlipidemia, CVA in 2002, COPD/asthma, sarcoidosis, chronic pain, s/p partial lung resection who presents with complaints of cough and shortness of breath.  He has had a lingering cough for 3 weeks.  It initially was nonproductive.  Patient reported that he had associated symptoms of wheezing and nausea from coughing at times.  He has not had any leg swelling, calf pain, chest pain, or done any recent travel.  Normally he is not on oxygen at baseline.  Patient reported that he quit smoking sometime last year.  This is his second admission this year for COPD exacerbation.  He also had been set up to have surgery to remove gynecomastia, but it had to be postponed.  Upon admission into the emergency department patient was seen to be afebrile with tachycardia and tachypnea.  Patient's blood pressure was elevated up to 177/117 and O2 saturations as low as 88% with improvement on 2 L of nasal cannula oxygen.  Labs from 8/23 were within normal limits including CBC, high-sensitivity troponins x2, and BNP.  Venous blood gas did not show signs of hypercapnia.  Patient had a mild elevation of glucose to 113.  Chest x-ray showed no acute abnormality with stable changes of partial left lung resection.  Influenza and COVID-19 screening were negative.  Patient had been given Solu-Medrol IV, magnesium sulfate, a continuous albuterol breathing treatment, and DuoNeb breathing treatments while in the ED.  Review of Systems: As mentioned in the history of present illness. All other systems reviewed and are negative. Past Medical History:   Diagnosis Date   Anxiety    Arthritis    Asthma    Chronic pain 05/06/2020   COPD (chronic obstructive pulmonary disease) (HCC)    CVA (cerebral vascular accident) (HCC)    2010; 06/2020   Depression    Diabetes mellitus type 2 in nonobese [facility])    Dyspnea    ED (erectile dysfunction)    Hyperlipidemia    Hypertension    Sarcoid    Tobacco dependence 05/06/2020   Past Surgical History:  Procedure Laterality Date   LUNG SURGERY     removed tissue   Social History:  reports that he quit smoking about 16 months ago. His smoking use included cigarettes. He has a 17.00 pack-year smoking history. He has never been exposed to tobacco smoke. He has never used smokeless tobacco. He reports that he does not drink alcohol and does not use drugs.  Allergies  Allergen Reactions   Insulins Swelling    04/2020. Do not use Novolog insulin   Ibuprofen     Abdominal sensitivity   Shrimp [Shellfish Allergy] Itching   Tylenol [Acetaminophen]     Abdominal sensitvity   Lisinopril Swelling and Rash    Tongue swelling    Family History  Problem Relation Age of Onset   Hypertension Mother    Diabetes Maternal Grandmother    Colon cancer Neg Hx    Esophageal cancer Neg Hx    Rectal cancer Neg Hx    Inflammatory bowel disease Neg Hx    Liver disease Neg Hx  Pancreatic cancer Neg Hx     Prior to Admission medications   Medication Sig Start Date End Date Taking? Authorizing Provider  ACCU-CHEK GUIDE test strip TEST BLOOD GLUCOSE DAILY 04/14/21   [provider]  Accu-Chek Softclix Lancets lancets SMARTSIG:Topical Once a Month 04/18/21   [provider]  albuterol (PROAIR HFA) 108 (90 Base) MCG/ACT inhaler 2 puffs every 4 hours as needed only  if your can't catch your breath Patient taking differently: 2 puffs in the morning, at noon, in the evening, and at bedtime. 11/11/20   [name], MD  albuterol (PROVENTIL) (2.5 MG/3ML) 0.083% nebulizer solution Take 3 mLs (2.5  mg total) by nebulization every 4 (four) hours as needed for wheezing or shortness of breath. 08/16/21   [name], MD  AMITIZA 24 MCG capsule Take 24 mcg by mouth daily as needed for constipation. 03/31/20   [provider]  amLODipine (NORVASC) 10 MG tablet Take 10 mg by mouth daily.    [provider]  Ascorbic Acid (VITAMIN C PO) Take 2 tablets by mouth daily.    [provider]  atorvastatin (LIPITOR) 40 MG tablet Take 1 tablet (40 mg total) by mouth daily. 06/26/20   Pokhrel, Rebekah Chesterfield, MD  budesonide-formoterol (SYMBICORT) 160-4.5 MCG/ACT inhaler Inhale 2 puffs into the lungs 2 (two) times daily. 07/23/21   [name], MD  cetirizine (ZYRTEC ALLERGY) 10 MG tablet Take 1 tablet (10 mg total) by mouth at bedtime. Patient taking differently: Take 10 mg by mouth daily as needed for allergies. 06/09/20   Parrett, Virgel Bouquet, NP  diclofenac Sodium (VOLTAREN) 1 % GEL Apply 1 application. topically daily as needed for pain. 02/09/21   [provider]  DULoxetine (CYMBALTA) 60 MG capsule Take 60 mg by mouth daily.    [provider]  ferrous sulfate 325 (65 FE) MG EC tablet Take 325 mg by mouth daily with breakfast.    [provider]  gabapentin (NEURONTIN) 300 MG capsule Take 1 capsule (300 mg total) by mouth 3 (three) times daily. Patient taking differently: Take 300 mg by mouth 3 (three) times daily as needed (siactic nerve). 02/09/18   Eustace Moore, MD  hydrochlorothiazide (HYDRODIURIL) 12.5 MG tablet Take 12.5 mg by mouth daily as needed (fluid). 03/26/20   [provider]  hydrOXYzine (VISTARIL) 50 MG capsule Take 50 mg by mouth at bedtime. 02/10/21   [provider]  metFORMIN (GLUCOPHAGE) 500 MG tablet Take 500 mg by mouth daily with breakfast. 04/17/20   [provider]  montelukast (SINGULAIR) 10 MG tablet TAKE 1 TABLET BY MOUTH EVERYDAY AT BEDTIME 05/11/21   Parrett, Tammy S, NP  nitroGLYCERIN (NITROSTAT) 0.4 MG SL  tablet Place 0.4 mg under the tongue every 5 (five) minutes as needed for chest pain.    [provider]  Omega-3 Fatty Acids (FISH OIL TRIPLE STRENGTH PO) Take 1,000 mg by mouth daily.    [provider]  Oxycodone HCl 20 MG TABS Take 20 mg by mouth in the morning, at noon, in the evening, and at bedtime. 03/31/20   [provider]  pantoprazole (PROTONIX) 40 MG tablet Take 1 tablet (40 mg total) by mouth daily. 07/02/19   Elgergawy, Leana Roe, MD  sildenafil (VIAGRA) 100 MG tablet Take 1 tablet (100 mg total) by mouth daily as needed. 05/24/21     sildenafil (VIAGRA) 100 MG tablet Take 1 tablet (100 mg total) by mouth daily as needed. 06/03/21  Fleet Contras, MD  tamsulosin (FLOMAX) 0.4 MG CAPS capsule Take 1 capsule by mouth daily as needed (prostate). 04/26/21   [provider]  Tiotropium Bromide Monohydrate (SPIRIVA RESPIMAT) 2.5 MCG/ACT AERS Inhale 1 puff into the lungs every 12 (twelve) hours. 05/12/21   Luciano Cutter, MD  vitamin B-12 1000 MCG tablet Take 1 tablet (1,000 mcg total) by mouth daily. 05/10/20   Briant Cedar, MD  Vitamin D, Ergocalciferol, (DRISDOL) 1.25 MG (50000 UNIT) CAPS capsule Take 50,000 Units by mouth once a week. Mondays 05/02/19   [provider]  XTAMPZA ER 9 MG C12A Take 1 capsule by mouth 2 (two) times daily. 04/09/21   [provider]    Physical Exam: Vitals:   09/30/21 0645 09/30/21 0700 09/30/21 0715 09/30/21 0811  BP: (!) 145/90 (!) 139/94 (!) 135/92 (!) 159/103  Pulse: (!) 107 (!) 103 (!) 106 (!) 111  Resp: 20 11 20  (!) 22  Temp:    97.9 F (36.6 C)  TempSrc:    Oral  SpO2: 96% 96% 96% 96%  Weight:    68 kg  Height:    5\' 5"  (1.651 m)   Exam  Constitutional: Older male who appears to be in no acute distress Eyes: PERRL, lids and conjunctivae normal ENMT: Mucous membranes are moist.   Neck: normal, supple,   Respiratory: Expiratory wheezes with decreased overall aeration.  Patient took off  oxygen and while talking O2 saturation dropping to 88%. Cardiovascular: Regular rate and rhythm, no murmurs / rubs / gallops. No extremity edema. 2+ pedal pulses. No carotid bruits.  Abdomen: no tenderness, no masses palpated.   Bowel sounds positive.  Musculoskeletal: no clubbing / cyanosis. No joint deformity upper and lower extremities. Good ROM, no contractures. Normal muscle tone.  Skin: no rashes, lesions, ulcers.   Neurologic: CN 2-12 grossly intact.  Strength 5/5 in all 4.  Psychiatric: Normal judgment and insight. Alert and oriented x 3. Normal mood.   Data Reviewed:  EKG revealed normal sinus rhythm at 93 bpm with QTc 445 similar to prior tracing.  Assessment and Plan: Acute respiratory failure with hypoxia secondary to COPD exacerbation Patient presented with complaints of worsening shortness of breath and cough.  O2 saturation noted to be as low as 85% with saturations currently maintained on 2 L of oxygen.  Chest x-ray did not note any acute abnormality with stable changes post partial lung resection.  Patient has been given breathing treatments, steroids, and magnesium sulfate. -Admit to a telemetry bed -COPD order set utilized -Continuous pulse oximetry with nasal cannula oxygen to maintain O2 saturations greater than 92% -Check pulse oximetry with ambulation -DuoNeb breathing treatments scheduled and albuterol nebs as needed -Transition to prednisone 40 mg daily in a.m. -Continue empiric treatment with azithromycin -Mucinex -PT consulted to evaluate and treat  Uncontrolled diabetes mellitus type 2 Last hemoglobin A1c 7.6 on 04/20/2021.  Home medication regimen includes metformin 500 mg daily with breakfast. -Hypoglycemic protocols -Hold metformin -CBGs before every meal and nightly with moderate SSI  Essential hypertension Home medication regimen includes amlodipine 10 mg daily, losartan-hydrochlorothiazide 100-12.5 mg daily, and hydrochlorothiazide 12.5 mg p.o. as  needed for fluid. -Continue current home medication regimen  Hyperlipidemia -Continue atorvastatin  History of CVA Previously occurred in May 2002.   -Continue Plavix and statin  History of sarcoidosis  Chronic pain syndrome  -Continue current oxycodone as needed  GERD -Continue Protonix    Advance Care Planning:   Code Status: Full Code  Consults: None  Family Communication: Patient declined me updating any of his family  Severity of Illness: The appropriate patient status for this patient is INPATIENT. Inpatient status is judged to be reasonable and necessary in order to provide the required intensity of service to ensure the patient's safety. The patient's presenting symptoms, physical exam findings, and initial radiographic and laboratory data in the context of their chronic comorbidities is felt to place them at high risk for further clinical deterioration. Furthermore, it is not anticipated that the patient will be medically stable for discharge from the hospital within 2 midnights of admission.   * I certify that at the point of admission it is my clinical judgment that the patient will require inpatient hospital care spanning beyond 2 midnights from the point of admission due to high intensity of service, high risk for further deterioration and high frequency of surveillance required.*  Author: Clydie Braun, MD 09/30/2021 8:26 AM  For on call review [email or web address].

## 2021-09-30 NOTE — ED Notes (Signed)
Pt appears to be resting comfortably, breathing appears less strenuous.

## 2021-09-30 NOTE — ED Notes (Signed)
Pt brought back to triage for reassessment, worsening shortness of breath. Pt seen by PA Tiburcio Pea, charge RN made aware.

## 2021-09-30 NOTE — ED Notes (Signed)
Patient stating he feels like he cant breathe. Patient oxygen 90% RA HR 115. Triage RN notified and patient placed on 3L O2 with improvement to 97% and HR 92

## 2021-09-30 NOTE — ED Notes (Signed)
Pt resting, 2nd hour Cannata breathing treatment has completed.  Pt was sleeping and maintaining an O2 of 94% on room air.  Provider asked RN to have pt ambulated.  By the time he made it to the door P't O2 level was 85% and dropping.  Pt placed back in bed by Tec and 2L  Junction was reapplied.

## 2021-09-30 NOTE — Evaluation (Signed)
Physical Therapy Evaluation Patient Details Name: Glenn [name] MRN: [id number] DOB: 1960/08/30 Today's Date: 09/30/2021  History of Present Illness  Pt is a 61 y/o male admitted secondary to COPD exacerbation. PMH includes COPD, asthma, sarcoidosis, and DM.  Clinical Impression  Pt admitted secondary to problem above. Mild SOB noted, but oxygen sats at 90-94% on RA throughout. Overall at a supervision to independent level with mobility tasks. Educated about energy conservation at home. No further skilled PT needs and further mobility needs to be deferred to mobility specialist team. Will sign off. If needs change, please re-consult.        Recommendations for follow up therapy are one component of a multi-disciplinary discharge planning process, led by the attending physician.  Recommendations may be updated based on patient status, additional functional criteria and insurance authorization.  Follow Up Recommendations No PT follow up      Assistance Recommended at Discharge Intermittent Supervision/Assistance  Patient can return home with the following  Assistance with cooking/housework    Equipment Recommendations None recommended by PT  Recommendations for Other Services       Functional Status Assessment Patient has had a recent decline in their functional status and demonstrates the ability to make significant improvements in function in a reasonable and predictable amount of time.     Precautions / Restrictions Precautions Precautions: None Restrictions Weight Bearing Restrictions: No      Mobility  Bed Mobility Overal bed mobility: Independent                  Transfers Overall transfer level: Independent                      Ambulation/Gait Ambulation/Gait assistance: Supervision Gait Distance (Feet): 40 Feet Assistive device: None Gait Pattern/deviations: Step-through pattern, Decreased stride length Gait velocity: Decreased     General  Gait Details: Ambulated in room as no portable monitor available to monitor oxygen sats. supervision for safety with no LOB noted.  Stairs            Wheelchair Mobility    Modified Rankin (Stroke Patients Only)       Balance Overall balance assessment: No apparent balance deficits (not formally assessed)                                           Pertinent Vitals/Pain Pain Assessment Pain Assessment: No/denies pain    Home Living Family/patient expects to be discharged to:: Private residence Living Arrangements: Spouse/significant other Available Help at Discharge: Family Type of Home: Apartment Home Access: Level entry       Home Layout: One level Home Equipment: Gilmer Mor - single point      Prior Function Prior Level of Function : Independent/Modified Independent             Mobility Comments: Uses cane occaisionally       Hand Dominance        Extremity/Trunk Assessment   Upper Extremity Assessment Upper Extremity Assessment: Overall WFL for tasks assessed    Lower Extremity Assessment Lower Extremity Assessment: Overall WFL for tasks assessed    Cervical / Trunk Assessment Cervical / Trunk Assessment: Normal  Communication   Communication: No difficulties  Cognition Arousal/Alertness: Awake/alert Behavior During Therapy: WFL for tasks assessed/performed Overall Cognitive Status: Within Functional Limits for tasks assessed  General Comments General comments (skin integrity, edema, etc.): Oxygen sats ranging from 90-94% on RA throughout.    Exercises     Assessment/Plan    PT Assessment Patient does not need any further PT services  PT Problem List         PT Treatment Interventions      PT Goals (Current goals can be found in the Care Plan section)  Acute Rehab PT Goals Patient Stated Goal: to go home PT Goal Formulation: With patient Time For Goal  Achievement: 09/30/21 Potential to Achieve Goals: Good    Frequency       Co-evaluation               AM-PAC PT "6 Clicks" Mobility  Outcome Measure Help needed turning from your back to your side while in a flat bed without using bedrails?: None Help needed moving from lying on your back to sitting on the side of a flat bed without using bedrails?: None Help needed moving to and from a bed to a chair (including a wheelchair)?: None Help needed standing up from a chair using your arms (e.g., wheelchair or bedside chair)?: None Help needed to walk in hospital room?: None Help needed climbing 3-5 steps with a railing? : None 6 Click Score: 24    End of Session   Activity Tolerance: Patient tolerated treatment well Patient left: in bed;with call bell/phone within reach (on stretcher in ED) Nurse Communication: Mobility status PT Visit Diagnosis: Other abnormalities of gait and mobility (R26.89)    Time: 6599-3570 PT Time Calculation (min) (ACUTE ONLY): 11 min   Charges:   PT Evaluation $PT Eval Low Complexity: 1 Low          Cindee Salt, DPT  Acute Rehabilitation Services  Office: [phone]   Lehman Prom 09/30/2021, 3:53 PM

## 2021-10-01 DIAGNOSIS — J9601 Acute respiratory failure with hypoxia: Secondary | ICD-10-CM | POA: Diagnosis present

## 2021-10-01 DIAGNOSIS — G894 Chronic pain syndrome: Secondary | ICD-10-CM | POA: Diagnosis not present

## 2021-10-01 DIAGNOSIS — J441 Chronic obstructive pulmonary disease with (acute) exacerbation: Secondary | ICD-10-CM | POA: Diagnosis not present

## 2021-10-01 DIAGNOSIS — E119 Type 2 diabetes mellitus without complications: Secondary | ICD-10-CM | POA: Diagnosis not present

## 2021-10-01 LAB — GLUCOSE, CAPILLARY
Glucose-Capillary: 98 mg/dL (ref 70–99)
Glucose-Capillary: 177 mg/dL — ABNORMAL HIGH (ref 70–99)
Glucose-Capillary: 276 mg/dL — ABNORMAL HIGH (ref 70–99)
Glucose-Capillary: 240 mg/dL — ABNORMAL HIGH (ref 70–99)
Glucose-Capillary: 157 mg/dL — ABNORMAL HIGH (ref 70–99)

## 2021-10-01 MED ORDER — METHYLPREDNISOLONE SODIUM SUCC 125 MG IJ SOLR
80.0000 mg | Freq: Every day | INTRAMUSCULAR | Status: DC
  Administered 2021-10-01 – 2021-10-03 (×3): 80 mg via INTRAVENOUS
  Filled 2021-10-01 (×3): qty 2

## 2021-10-01 MED ORDER — HYDRALAZINE HCL 20 MG/ML IJ SOLN: 10.0000 mg | Freq: Four times a day (QID) | INTRAMUSCULAR | Status: DC | PRN

## 2021-10-01 MED ORDER — BENZONATATE 100 MG PO CAPS
200.0000 mg | ORAL_CAPSULE | Freq: Three times a day (TID) | ORAL | Status: DC
  Administered 2021-10-01 – 2021-10-03 (×7): 200 mg via ORAL
  Filled 2021-10-01 (×7): qty 2

## 2021-10-01 MED ORDER — IPRATROPIUM-ALBUTEROL 0.5-2.5 (3) MG/3ML IN SOLN: 3.0000 mL | Freq: Three times a day (TID) | RESPIRATORY_TRACT | Status: DC

## 2021-10-01 MED ORDER — ALBUTEROL SULFATE (2.5 MG/3ML) 0.083% IN NEBU: 2.5000 mg | INHALATION_SOLUTION | Freq: Four times a day (QID) | RESPIRATORY_TRACT | Status: DC | PRN

## 2021-10-01 MED ORDER — METFORMIN HCL 500 MG PO TABS
500.0000 mg | ORAL_TABLET | Freq: Two times a day (BID) | ORAL | Status: DC
  Administered 2021-10-01 – 2021-10-03 (×4): 500 mg via ORAL
  Filled 2021-10-01 (×4): qty 1

## 2021-10-01 MED ORDER — REVEFENACIN 175 MCG/3ML IN SOLN
175.0000 ug | Freq: Every day | RESPIRATORY_TRACT | Status: DC
  Administered 2021-10-02 – 2021-10-03 (×2): 175 ug via RESPIRATORY_TRACT
  Filled 2021-10-01 (×4): qty 3

## 2021-10-01 MED ORDER — HYDROCOD POLI-CHLORPHE POLI ER 10-8 MG/5ML PO SUER
5.0000 mL | Freq: Two times a day (BID) | ORAL | Status: DC
  Administered 2021-10-01 – 2021-10-03 (×5): 5 mL via ORAL
  Filled 2021-10-01 (×5): qty 5

## 2021-10-01 MED ORDER — HYDROCODONE BIT-HOMATROP MBR 5-1.5 MG/5ML PO SOLN: 5.0000 mL | Freq: Four times a day (QID) | ORAL | Status: DC | PRN

## 2021-10-01 NOTE — Plan of Care (Signed)
°  Problem: Activity: °Goal: Ability to tolerate increased activity will improve °Outcome: Progressing °  °Problem: Respiratory: °Goal: Levels of oxygenation will improve °Outcome: Progressing °Goal: Ability to maintain adequate ventilation will improve °Outcome: Progressing °  °

## 2021-10-01 NOTE — Consult Note (Signed)
NAME:  Glenn [name], MRN:  [id number], DOB:  1960-06-05, LOS: 1 ADMISSION DATE:  09/29/2021, CONSULTATION DATE:  10/01/2021 REFERRING MD:  Dr. Isidoro Donning - [facility], CHIEF COMPLAINT:  COPD/asthma exacerbation with underlying sarcoidosis    History of Present Illness:  Glenn [name] is a 61 y.o. male with a PMH significant for but not limited to COPD, sarcoidosis, s/p partial lung resection, prior CVA, type 2 diabetes, hypertension, hyperlipidemia, prior CVA, and anxiety who presented to the emergency department 8/24 for complaints of cough and shortness of breath.  Patient reports lingering cough x3 weeks initially productive associated wheezing and nausea.  On ED arrival patient was seen afebrile with tachycardia and tachypnea.  Vital signs significant for mild tachypnea and hypertension.  Mild hypoxia seen requiring application of 2 L nasal cannula.  Lab work relatively unremarkable.  Chest x-ray on arrival relatively unremarkable as well.  Pertinent  Medical History  COPD, sarcoidosis, s/p partial lung resection, prior CVA, type 2 diabetes, hypertension, hyperlipidemia, prior CVA, and anxiety  Significant Hospital Events: Including procedures, antibiotic start and stop dates in addition to other pertinent events   8/24 presented with complaints of cough and SOB, admitted with acute COPD exacerbation  8/25 PCCM consulted for assistance in management   Interim History / Subjective:  As above   Objective   Blood pressure (!) 155/102, pulse 86, temperature 98 F (36.7 C), temperature source Oral, resp. rate 19, height 5\' 5"  (1.651 m), weight 68 kg, SpO2 99 %.        Intake/Output Summary (Last 24 hours) at 10/01/2021 1242 Last data filed at 10/01/2021 0500 Gross per 24 hour  Intake 480 ml  Output 700 ml  Net -220 ml   Filed Weights   09/30/21 0811  Weight: 68 kg    Examination: General: Chronically ill appearing elderly, comfortable in bed  HEENT: ETT, MM pink/moist, PERRL,  Neuro: alert  oriented, following commands  CV: s1s2 regular rate and rhythm, no murmur, rubs, or gallops,  PULM: expiratory wheezing on exam  GI: soft, bowel sounds active in all 4 quadrants, non-tender, non-distended, tolerating  Extremities: warm/dry, no edema  Skin: no rashes or lesions  Resolved Hospital Problem list     Assessment & Plan:   Acute Hypoxic Respiratory Failure in the setting of acute COPD exacerbation  -8/24 presented with complaints of cough and SOB, admitted with acute COPD exacerbation  HX of COPD/asthma/Sarcoidosis  -Home nebs include PRN albuterol, Symbicort,  P: Continue supplement o2, sats >88% IV solumedrol  Azithromycin X 5 days Singulair  Smoking cessation counseling  Changed to nebs  Will need outpatient follow up with pulmonary   Pulm will see one day this weekend    Best Practice (right click and "Reselect all SmartList Selections" daily)  Per primary   Labs   CBC: Recent Labs  Lab 09/29/21 2358 09/30/21 0239  WBC 7.8  --   HGB 13.6 15.0  HCT 42.4 44.0  MCV 88.5  --   PLT 225  --     Basic Metabolic Panel: Recent Labs  Lab 09/29/21 2358 09/30/21 0239  NA 139 139  K 3.6 3.6  CL 105  --   CO2 28  --   GLUCOSE 113*  --   BUN 14  --   CREATININE 0.85  --   CALCIUM 9.3  --    GFR: Estimated Creatinine Clearance: 79.4 mL/min (by C-G formula based on SCr of 0.85 mg/dL). Recent Labs  Lab 09/29/21  2358  WBC 7.8    Liver Function Tests: No results for input(s): "AST", "ALT", "ALKPHOS", "BILITOT", "PROT", "ALBUMIN" in the last 168 hours. No results for input(s): "LIPASE", "AMYLASE" in the last 168 hours. No results for input(s): "AMMONIA" in the last 168 hours.  ABG    Component Value Date/Time   HCO3 30.2 (H) 09/30/2021 0239   TCO2 32 09/30/2021 0239   O2SAT 99 09/30/2021 0239     Coagulation Profile: No results for input(s): "INR", "PROTIME" in the last 168 hours.  Cardiac Enzymes: No results for input(s): "CKTOTAL", "CKMB",  "CKMBINDEX", "TROPONINI" in the last 168 hours.  HbA1C: Hgb A1c MFr Bld  Date/Time Value Ref Range Status  04/20/2021 01:43 PM 7.6 (H) 4.8 - 5.6 % Final    Comment:    (NOTE)         Prediabetes: 5.7 - 6.4         Diabetes: >6.4         Glycemic control for adults with diabetes: <7.0   06/24/2020 03:03 AM 6.8 (H) 4.8 - 5.6 % Final    Comment:    (NOTE) Pre diabetes:          5.7%-6.4%  Diabetes:              >6.4%  Glycemic control for   <7.0% adults with diabetes     CBG: Recent Labs  Lab 09/30/21 0828 09/30/21 2105 10/01/21 0828 10/01/21 1112  GLUCAP 236* 174* 98 177*    Review of Systems:   Please see the history of present illness. All other systems reviewed and are negative   Past Medical History:  He,  has a past medical history of Anxiety, Arthritis, Asthma, Chronic pain (05/06/2020), COPD (chronic obstructive pulmonary disease) (HCC), CVA (cerebral vascular accident) (HCC), Depression, Diabetes mellitus type 2 in nonobese (HCC), Dyspnea, ED (erectile dysfunction), Hyperlipidemia, Hypertension, Sarcoid, and Tobacco dependence (05/06/2020).   Surgical History:   Past Surgical History:  Procedure Laterality Date   LUNG SURGERY     removed tissue     Social History:   reports that he quit smoking about 16 months ago. His smoking use included cigarettes. He has a 17.00 pack-year smoking history. He has never been exposed to tobacco smoke. He has never used smokeless tobacco. He reports that he does not drink alcohol and does not use drugs.   Family History:  His family history includes Diabetes in his maternal grandmother; Hypertension in his mother. There is no history of Colon cancer, Esophageal cancer, Rectal cancer, Inflammatory bowel disease, Liver disease, or Pancreatic cancer.   Allergies Allergies  Allergen Reactions   Insulins Swelling    04/2020. Do not use Novolog insulin. Swelling of tongue   Ibuprofen Other (See Comments)    Abdominal  sensitivity   Pork-Derived Products Other (See Comments)    Cultural-Muslim   Shrimp [Shellfish Allergy] Itching   Tylenol [Acetaminophen] Nausea Only    Abdominal sensitvity   Lisinopril Swelling and Rash    Tongue swelling     Home Medications  Prior to Admission medications   Medication Sig Start Date End Date Taking? Authorizing Provider  albuterol (PROAIR HFA) 108 (90 Base) MCG/ACT inhaler 2 puffs every 4 hours as needed only  if your can't catch your breath Patient taking differently: Inhale 2 puffs into the lungs in the morning, at noon, in the evening, and at bedtime. 11/11/20  Yes [name], MD  albuterol (PROVENTIL) (2.5 MG/3ML) 0.083% nebulizer solution Take  3 mLs (2.5 mg total) by nebulization every 4 (four) hours as needed for wheezing or shortness of breath. Patient taking differently: Take 2.5 mg by nebulization See admin instructions. 2.5 mg nebulization by mouth  every hour as needed for SOB and wheezing 08/16/21  Yes [name], MD  AMITIZA 24 MCG capsule Take 24 mcg by mouth daily as needed for constipation. 03/31/20  Yes [provider]  Ascorbic Acid (VITAMIN C PO) Take 2 tablets by mouth daily.   Yes [provider]  atorvastatin (LIPITOR) 40 MG tablet Take 1 tablet (40 mg total) by mouth daily. 06/26/20  Yes Pokhrel, Laxman, MD  azithromycin (ZITHROMAX) 250 MG tablet Take 250 mg by mouth daily. 09/27/21  Yes [provider]  budesonide-formoterol (SYMBICORT) 160-4.5 MCG/ACT inhaler Inhale 2 puffs into the lungs 2 (two) times daily. 07/23/21  Yes [name], MD  cetirizine (ZYRTEC ALLERGY) 10 MG tablet Take 1 tablet (10 mg total) by mouth at bedtime. Patient taking differently: Take 10 mg by mouth daily as needed for allergies. 06/09/20  Yes Parrett, Tammy S, NP  clopidogrel (PLAVIX) 75 MG tablet Take 75 mg by mouth daily. 07/06/21  Yes [provider]  diclofenac Sodium (VOLTAREN) 1 % GEL Apply 1 application. topically daily  as needed for pain. 02/09/21  Yes [provider]  DULoxetine (CYMBALTA) 30 MG capsule Take 30 mg by mouth daily. 08/30/21  Yes [provider]  ferrous sulfate 325 (65 FE) MG EC tablet Take 325 mg by mouth daily with breakfast.   Yes [provider]  gabapentin (NEURONTIN) 300 MG capsule Take 1 capsule (300 mg total) by mouth 3 (three) times daily. Patient taking differently: Take 600 mg by mouth daily. 02/09/18  Yes Eustace Moore, MD  hydrOXYzine (VISTARIL) 50 MG capsule Take 50 mg by mouth at bedtime. 02/10/21  Yes [provider]  losartan-hydrochlorothiazide (HYZAAR) 100-12.5 MG tablet Take 1 tablet by mouth daily. 09/03/21  Yes [provider]  metFORMIN (GLUCOPHAGE) 500 MG tablet Take 500 mg by mouth daily with breakfast. 04/17/20  Yes [provider]  montelukast (SINGULAIR) 10 MG tablet TAKE 1 TABLET BY MOUTH EVERYDAY AT BEDTIME Patient taking differently: Take 10 mg by mouth as needed (allergies). 05/11/21  Yes Parrett, Virgel Bouquet, NP  naloxone (NARCAN) nasal spray 4 mg/0.1 mL SMARTSIG:Both Nares 08/20/21  Yes [provider]  nitroGLYCERIN (NITROSTAT) 0.4 MG SL tablet Place 0.4 mg under the tongue every 5 (five) minutes as needed for chest pain.   Yes [provider]  Oxycodone HCl 20 MG TABS Take 20 mg by mouth in the morning, at noon, in the evening, and at bedtime. 03/31/20  Yes [provider]  pantoprazole (PROTONIX) 40 MG tablet Take 1 tablet (40 mg total) by mouth daily. 07/02/19  Yes Elgergawy, Leana Roe, MD  predniSONE (DELTASONE) 10 MG tablet Take 10 mg by mouth daily as needed (SOB). 09/27/21  Yes [provider]  sildenafil (VIAGRA) 100 MG tablet Take 1 tablet (100 mg total) by mouth daily as needed. Patient taking differently: Take 100 mg by mouth daily as needed for erectile dysfunction. 05/24/21  Yes   tamsulosin (FLOMAX) 0.4 MG CAPS capsule Take 0.4 mg by mouth daily. 04/26/21  Yes [provider]  Vitamin D, Ergocalciferol, (DRISDOL) 1.25 MG (50000 UNIT) CAPS capsule Take 50,000 Units by mouth once a week. Mondays 05/02/19  Yes [provider]  XTAMPZA ER 9 MG C12A Take 9 mg by  mouth 2 (two) times daily. 04/09/21  Yes [provider]  amLODipine (NORVASC) 10 MG tablet Take 10 mg by mouth daily. Patient not taking: Reported on 09/30/2021    [provider]  hydrochlorothiazide (HYDRODIURIL) 12.5 MG tablet Take 12.5 mg by mouth daily as needed (fluid). Patient not taking: Reported on 09/30/2021 03/26/20   [provider]  Omega-3 Fatty Acids (FISH OIL TRIPLE STRENGTH PO) Take 1,000 mg by mouth daily. Patient not taking: Reported on 09/30/2021    [provider]  vitamin B-12 1000 MCG tablet Take 1 tablet (1,000 mcg total) by mouth daily. Patient not taking: Reported on 09/30/2021 05/10/20   Briant Cedar, MD      Josephine Igo, DO [facility] 10/01/2021 4:05 PM

## 2021-10-01 NOTE — Progress Notes (Addendum)
[facility]                                                                              Glenn [name], is a 61 y.o. male, DOB - [birth year], JHE:[id number] Admit date - 09/29/2021    Outpatient Primary MD for the patient is Fleet Contras, MD  LOS - 1  days  Chief Complaint  Patient presents with   Chest Pain   Shortness of Breath       Brief summary   Patient is a 61 year old male with hypertension, HLP, CVA in 2002, COPD/asthma, sarcoidosis, follows Dr. Octavio Graves, chronic pain, partial lung resection presented to ED with shortness of breath and cough for last 3 weeks.  Patient reports wheezing, nausea for coughing.  No leg swelling, chest pain or any recent travels.  Normally not on O2 at baseline.  Quit smoking last year.  Second admission this year for COPD exacerbation. In ED, BP was elevated 177/117, O2 sats low as 88% with improvement on 2 L. Patient was given Solu-Medrol IV, magnesium sulfate, continuous albuterol breathing treatment and admitted for further work-up.  Chest x-ray did not show any infiltrates.  COVID-19, flu screening negative  Assessment & Plan    Principal Problem:   Acute respiratory failure with hypoxia (HCC) secondary to COPD with acute exacerbation -At baseline not on O2, O2 sats in ED 88% and was placed on 2 L -On exam, diffuse wheezing with cough spells, dry and nonproductive -Continue IV Solu-Medrol 40 mg every 12 hours, continue Brovana, Pulmicort, Zithromax -Placed on [employer], Tussionex, incentive spirometry -Pulmonology consulted  Active Problems:    Diabetes mellitus type 2 in nonobese [facility]), NIIDM uncontrolled with hyperglycemia Recent Labs    09/30/21 0828 09/30/21 2105 10/01/21 0828 10/01/21 1112  GLUCAP 236* 174* 98 177*   - CBGs likely elevated due to steroids, -hemoglobin A1c 7.6 on 04/20/2021 -Allergic to insulin, will place on metformin, increase to 500 mg twice daily while on steroids     Hyperlipidemia -Continue atorvastatin    Hypertension urgency -Elevated BP 177/117 on admission -Resume home and amlodipine, hydrochlorothiazide, losartan -Add hydralazine IV as needed with parameters    History of CVA (cerebrovascular accident) in May 2002 -Continue Plavix, statin    Personal history of sarcoidosis -No acute issues    Chronic pain syndrome -Continue oxycodone as needed  GERD -Continue PPI  Code Status: Full code hopefully DC home in DVT Prophylaxis:  enoxaparin (LOVENOX) injection 40 mg Start: 09/30/21 1000   Level of Care: Level of care: Telemetry Medical Family Communication: Updated patient   Disposition Plan:      Remains inpatient appropriate: DC home 24 to 48 hours if improving, currently wheezing bilaterally   Procedures:  None  Consultants:   Pulmonary  Antimicrobials:   Anti-infectives (From admission, onward)    Start     Dose/Rate Route Frequency Ordered Stop   10/01/21 1000  azithromycin (ZITHROMAX) tablet 500 mg       See Hyperspace for full Linked Orders Report.   500 mg Oral Daily 09/30/21 0849 10/05/21 0959   09/30/21 1000  azithromycin (ZITHROMAX) 500 mg in sodium  chloride 0.9 % 250 mL IVPB       See Hyperspace for full Linked Orders Report.   500 mg 250 mL/hr over 60 Minutes Intravenous Every 24 hours 09/30/21 0849 09/30/21 1132          Medications  amLODipine  10 mg Oral Daily   arformoterol  15 mcg Nebulization BID   atorvastatin  40 mg Oral Daily   azithromycin  500 mg Oral Daily   benzonatate  200 mg Oral TID   budesonide (PULMICORT) nebulizer solution  0.5 mg Nebulization BID   chlorpheniramine-HYDROcodone  5 mL Oral Q12H   clopidogrel  75 mg Oral Daily   DULoxetine  30 mg Oral Daily   enoxaparin (LOVENOX) injection  40 mg Subcutaneous Q24H   gabapentin  600 mg Oral Daily   hydrochlorothiazide  12.5 mg Oral Daily   hydrOXYzine  50 mg Oral QHS   insulin aspart  0-15 Units Subcutaneous TID WC   insulin  aspart  0-5 Units Subcutaneous QHS   ipratropium-albuterol  3 mL Nebulization QID   losartan  100 mg Oral Daily   methylPREDNISolone (SOLU-MEDROL) injection  80 mg Intravenous Daily   nicotine  21 mg Transdermal Daily   pantoprazole  40 mg Oral Daily   tamsulosin  0.4 mg Oral Daily      Subjective:   Glenn [name] was seen and examined today.  Coughing spells, no productive phlegm, no chest pain.  No fevers or chills.  Diffusely wheezing with shortness of breath.   Objective:   Vitals:   10/01/21 0830 10/01/21 0852 10/01/21 0854 10/01/21 1215  BP: (!) 155/102     Pulse: 86     Resp: 19     Temp:      TempSrc:      SpO2: 98% 100% 96% 99%  Weight:      Height:        Intake/Output Summary (Last 24 hours) at 10/01/2021 1226 Last data filed at 10/01/2021 0500 Gross per 24 hour  Intake 480 ml  Output 700 ml  Net -220 ml     Wt Readings from Last 3 Encounters:  09/30/21 68 kg  08/16/21 67.8 kg  06/17/21 67.6 kg     Exam General: Alert and oriented x 3, NAD Cardiovascular: S1 S2 auscultated,  RRR Respiratory: Bilateral expiratory wheezing Gastrointestinal: Soft, nontender, nondistended, + bowel sounds Ext: no pedal edema bilaterally Neuro: no new FND's Psych: Normal affect and demeanor, alert and oriented x3     Data Reviewed:  I have personally reviewed following labs    CBC Lab Results  Component Value Date   WBC 7.8 09/29/2021   RBC 4.79 09/29/2021   HGB 15.0 09/30/2021   HCT 44.0 09/30/2021   MCV 88.5 09/29/2021   MCH 28.4 09/29/2021   PLT 225 09/29/2021   MCHC 32.1 09/29/2021   RDW 13.5 09/29/2021   LYMPHSABS 3.9 05/23/2021   MONOABS 0.8 05/23/2021   EOSABS 0.4 05/23/2021   BASOSABS 0.1 05/23/2021     Last metabolic panel Lab Results  Component Value Date   NA 139 09/30/2021   K 3.6 09/30/2021   CL 105 09/29/2021   CO2 28 09/29/2021   BUN 14 09/29/2021   CREATININE 0.85 09/29/2021   GLUCOSE 113 (H) 09/29/2021   GFRNONAA >60 09/29/2021    GFRAA >60 07/01/2019   CALCIUM 9.3 09/29/2021   PROT 6.4 (L) 05/24/2021   ALBUMIN 3.5 05/24/2021   BILITOT 0.5 05/24/2021   ALKPHOS 53  05/24/2021   AST 16 05/24/2021   ALT 17 05/24/2021   ANIONGAP 6 09/29/2021    CBG (last 3)  Recent Labs    09/30/21 2105 10/01/21 0828 10/01/21 1112  GLUCAP 174* 98 177*      Coagulation Profile: No results for input(s): "INR", "PROTIME" in the last 168 hours.   Radiology Studies: I have personally reviewed the imaging studies  DG Chest 2 View  Result Date: 09/30/2021 IMPRESSION: 1. No radiographic evidence of acute cardiopulmonary disease. 2. Stable changes of partial left lung resection. 3. Central pulmonary arterial enlargement Electronically Signed   By: Helyn Numbers M.D.   On: 09/30/2021 00:22       Moyses Pavey M.D. [facility] 10/01/2021, 12:26 PM  Available via Epic secure chat 7am-7pm After 7 pm, please refer to night coverage provider listed on amion.

## 2021-10-01 NOTE — Progress Notes (Signed)
Mobility Specialist Progress Note    10/01/21 1044  Mobility  Activity Ambulated independently in hallway  Level of Assistance Independent  Assistive Device None  Distance Ambulated (ft) 550 ft  Activity Response Tolerated well  $Mobility charge 1 Mobility   Pre-Mobility: 97 HR, 93% SpO2 During Mobility: 125 HR, >/=90% on RA SpO2 Post-Mobility: 94 HR, 97% on 2LO2 SpO2  Pt received in bed and agreeable. No complaints on walk. SpO2 >/=90% on RA. Upon return, SpO2 = 85% and placed pt back on 2LO2. Left in bed with call bell in reach. RN notified.   [place] Mobility Specialist

## 2021-10-02 DIAGNOSIS — J9601 Acute respiratory failure with hypoxia: Secondary | ICD-10-CM | POA: Diagnosis not present

## 2021-10-02 DIAGNOSIS — E119 Type 2 diabetes mellitus without complications: Secondary | ICD-10-CM | POA: Diagnosis not present

## 2021-10-02 DIAGNOSIS — J441 Chronic obstructive pulmonary disease with (acute) exacerbation: Secondary | ICD-10-CM | POA: Diagnosis not present

## 2021-10-02 LAB — GLUCOSE, CAPILLARY
Glucose-Capillary: 169 mg/dL — ABNORMAL HIGH (ref 70–99)
Glucose-Capillary: 154 mg/dL — ABNORMAL HIGH (ref 70–99)
Glucose-Capillary: 261 mg/dL — ABNORMAL HIGH (ref 70–99)
Glucose-Capillary: 254 mg/dL — ABNORMAL HIGH (ref 70–99)

## 2021-10-02 NOTE — Plan of Care (Signed)
  Problem: Education: Goal: Knowledge of disease or condition will improve Outcome: Progressing Goal: Knowledge of the prescribed therapeutic regimen will improve Outcome: Progressing   Problem: Activity: Goal: Ability to tolerate increased activity will improve Outcome: Progressing   Problem: Respiratory: Goal: Ability to maintain a clear airway will improve Outcome: Progressing   Problem: Education: Goal: Knowledge of General Education information will improve Description: Including pain rating scale, medication(s)/side effects and non-pharmacologic comfort measures Outcome: Progressing   Problem: Pain Managment: Goal: General experience of comfort will improve Outcome: Progressing   Problem: Safety: Goal: Ability to remain free from injury will improve Outcome: Progressing   Problem: Skin Integrity: Goal: Risk for impaired skin integrity will decrease Outcome: Progressing

## 2021-10-02 NOTE — Progress Notes (Signed)
   NAME:  Glenn [name], MRN:  [id number], DOB:  [birth year], LOS: 2 ADMISSION DATE:  09/29/2021, CONSULTATION DATE:  10/01/2021 REFERRING MD:  Dr. Isidoro Donning - [facility], CHIEF COMPLAINT:  COPD/asthma exacerbation with underlying sarcoidosis    History of Present Illness:  Glenn [name] is a 61 y.o. male with a PMH significant for but not limited to COPD, sarcoidosis, s/p partial lung resection, prior CVA, type 2 diabetes, hypertension, hyperlipidemia, prior CVA, and anxiety who presented to the emergency department 8/24 for complaints of cough and shortness of breath.  Patient reports lingering cough x3 weeks initially productive associated wheezing and nausea.  On ED arrival patient was seen afebrile with tachycardia and tachypnea.  Vital signs significant for mild tachypnea and hypertension.  Mild hypoxia seen requiring application of 2 L nasal cannula.  Lab work relatively unremarkable.  Chest x-ray on arrival relatively unremarkable as well.  Pertinent  Medical History  COPD, sarcoidosis, s/p partial lung resection, prior CVA, type 2 diabetes, hypertension, hyperlipidemia, prior CVA, and anxiety  Significant Hospital Events: Including procedures, antibiotic start and stop dates in addition to other pertinent events   8/24 presented with complaints of cough and SOB, admitted with acute COPD exacerbation  8/25 PCCM consulted for assistance in management  8/26 No acute issues overnight, on RA this am   Interim History / Subjective:  Flat affect this am with request to "leave me alone and let me sleep: but was able to articulate that he feels "better" and is "close" to his baseline   Objective   Blood pressure (!) 131/93, pulse 89, temperature 97.6 F (36.4 C), temperature source Oral, resp. rate (!) 21, height 5\' 5"  (1.651 m), weight 68 kg, SpO2 100 %.       No intake or output data in the 24 hours ending 10/02/21 0721  Filed Weights   09/30/21 0811  Weight: 68 kg    Examination: General: Well  appearing middle aged male lying in bed, in NAD HEENT: [place]/AT, MM pink/moist, PERRL,  Neuro: Flat affect but appeared alert and oriented x3, non-focal  CV: s1s2 regular rate and rhythm, no murmur, rubs, or gallops,  PULM:  Clear to ascultation, no increased work of breathing, no added breath sounds, on RA GI: soft, bowel sounds active in all 4 quadrants, non-tender, non-distended, tolerating oral diet Extremities: warm/dry, no edema  Skin: no rashes or lesions  Resolved Hospital Problem list     Assessment & Plan:   Acute Hypoxic Respiratory Failure in the setting of acute COPD exacerbation  -8/24 presented with complaints of cough and SOB, admitted with acute COPD exacerbation  HX of COPD/asthma/Sarcoidosis  -Home nebs include PRN albuterol, Symbicort,  P: Continue supplemental oxygen for sats greater than 88% Continue IV solu-medrol, wean as able  Complete 5 days of IV Azithromycin  Continue Singular, Brovana, Pulmicort, Claritin, and Yupelri Smoking cessation education  Will need outpatient follow up on discharge   Pulmonary will see again 8/28  Best Practice (right click and "Reselect all SmartList Selections" daily)  Per primary   Signature:  Nori Poland D. 9/28, NP-C [facility] Personal contact information can be found on Amion  10/02/2021, 7:22 AM

## 2021-10-02 NOTE — Progress Notes (Signed)
[facility]                                                                              Glenn [name], is a 61 y.o. male, DOB - 1961/01/26, MBW:[id number] Admit date - 09/29/2021    Outpatient Primary MD for the patient is Fleet Contras, MD  LOS - 2  days  Chief Complaint  Patient presents with   Chest Pain   Shortness of Breath       Brief summary   Patient is a 61 year old male with hypertension, HLP, CVA in 2002, COPD/asthma, sarcoidosis, follows Dr. Octavio Graves, chronic pain, partial lung resection presented to ED with shortness of breath and cough for last 3 weeks.  Patient reports wheezing, nausea for coughing.  No leg swelling, chest pain or any recent travels.  Normally not on O2 at baseline.  Quit smoking last year.  Second admission this year for COPD exacerbation. In ED, BP was elevated 177/117, O2 sats low as 88% with improvement on 2 L. Patient was given Solu-Medrol IV, magnesium sulfate, continuous albuterol breathing treatment and admitted for further work-up.  Chest x-ray did not show any infiltrates.  COVID-19, flu screening negative  Assessment & Plan    Principal Problem:   Acute respiratory failure with hypoxia (HCC) secondary to COPD with acute exacerbation -At baseline not on O2, O2 sats in ED 88% and was placed on 2 L -Improving, continue supplemental O2, ambulatory home O2 evaluation at the time of discharge. -Continue IV steroids, will transition to prednisone in a.m. -States cough is much better with Jerilynn Som, Tussionex, continue incentive spirometry -Continue Zithromax, pulmonology following -Use Singulair, Brovana, Pulmicort, Claritin, Yupelri  Active Problems:    Diabetes mellitus type 2 in nonobese [facility]), NIIDM uncontrolled with hyperglycemia Recent Labs    10/01/21 1112 10/01/21 1637 10/01/21 1933 10/01/21 2141 10/02/21 0825 10/02/21 1216  GLUCAP 177* 276* 240* 157* 169* 154*   - CBGs likely elevated due to  steroids, -hemoglobin A1c 7.6 on 04/20/2021 -Allergic to insulin, continue metformin     Hyperlipidemia -Continue atorvastatin    Hypertension urgency -Elevated BP 177/117 on admission -BP stable, continue amlodipine, HCTZ, losartan -Continue IV hydralazine as needed with parameters    History of CVA (cerebrovascular accident) in May 2002 -Continue Plavix, statin    Personal history of sarcoidosis -No acute issues    Chronic pain syndrome -Continue oxycodone as needed  GERD -Continue PPI  Code Status: Full code hopefully DC home in DVT Prophylaxis:  enoxaparin (LOVENOX) injection 40 mg Start: 09/30/21 1000   Level of Care: Level of care: Telemetry Medical Family Communication: Updated patient   Disposition Plan:      Remains inpatient appropriate: Hopefully home tomorrow  Procedures:  None  Consultants:   Pulmonary  Antimicrobials:   Anti-infectives (From admission, onward)    Start     Dose/Rate Route Frequency Ordered Stop   10/01/21 1000  azithromycin (ZITHROMAX) tablet 500 mg       See Hyperspace for full Linked Orders Report.   500 mg Oral Daily 09/30/21 0849 10/05/21 0959   09/30/21 1000  azithromycin (ZITHROMAX) 500 mg in sodium  chloride 0.9 % 250 mL IVPB       See Hyperspace for full Linked Orders Report.   500 mg 250 mL/hr over 60 Minutes Intravenous Every 24 hours 09/30/21 0849 09/30/21 1132          Medications  amLODipine  10 mg Oral Daily   arformoterol  15 mcg Nebulization BID   atorvastatin  40 mg Oral Daily   azithromycin  500 mg Oral Daily   benzonatate  200 mg Oral TID   budesonide (PULMICORT) nebulizer solution  0.5 mg Nebulization BID   chlorpheniramine-HYDROcodone  5 mL Oral Q12H   clopidogrel  75 mg Oral Daily   DULoxetine  30 mg Oral Daily   enoxaparin (LOVENOX) injection  40 mg Subcutaneous Q24H   gabapentin  600 mg Oral Daily   hydrochlorothiazide  12.5 mg Oral Daily   hydrOXYzine  50 mg Oral QHS   losartan  100 mg Oral  Daily   metFORMIN  500 mg Oral BID WC   methylPREDNISolone (SOLU-MEDROL) injection  80 mg Intravenous Daily   nicotine  21 mg Transdermal Daily   pantoprazole  40 mg Oral Daily   revefenacin  175 mcg Nebulization Daily   tamsulosin  0.4 mg Oral Daily      Subjective:   Glenn [name] was seen and examined today.  Reports that coughing is much improving with the cough syrup and Tessalon Perles.  Wheezing is also getting better.  Not feeling 100%.  Objective:   Vitals:   10/02/21 0622 10/02/21 0830 10/02/21 0857 10/02/21 0858  BP: (!) 131/93 125/86    Pulse: 89 88    Resp: (!) 21 (!) 23    Temp: 97.6 F (36.4 C) 98.5 F (36.9 C)    TempSrc: Oral Axillary    SpO2: 100% 92% 91% 92%  Weight:      Height:        Intake/Output Summary (Last 24 hours) at 10/02/2021 1433 Last data filed at 10/02/2021 1300 Gross per 24 hour  Intake 480 ml  Output 600 ml  Net -120 ml     Wt Readings from Last 3 Encounters:  09/30/21 68 kg  08/16/21 67.8 kg  06/17/21 67.6 kg   Physical Exam General: Alert and oriented x 3, NAD Cardiovascular: S1 S2 clear, RRR.  Respiratory: Mild scattered wheezing, improving Gastrointestinal: Soft, nontender, nondistended, NBS Ext: no pedal edema bilaterally Neuro: no new deficits Psych: Normal affect and demeanor, alert and oriented x3     Data Reviewed:  I have personally reviewed following labs    CBC Lab Results  Component Value Date   WBC 7.8 09/29/2021   RBC 4.79 09/29/2021   HGB 15.0 09/30/2021   HCT 44.0 09/30/2021   MCV 88.5 09/29/2021   MCH 28.4 09/29/2021   PLT 225 09/29/2021   MCHC 32.1 09/29/2021   RDW 13.5 09/29/2021   LYMPHSABS 3.9 05/23/2021   MONOABS 0.8 05/23/2021   EOSABS 0.4 05/23/2021   BASOSABS 0.1 05/23/2021     Last metabolic panel Lab Results  Component Value Date   NA 139 09/30/2021   K 3.6 09/30/2021   CL 105 09/29/2021   CO2 28 09/29/2021   BUN 14 09/29/2021   CREATININE 0.85 09/29/2021   GLUCOSE 113  (H) 09/29/2021   GFRNONAA >60 09/29/2021   GFRAA >60 07/01/2019   CALCIUM 9.3 09/29/2021   PROT 6.4 (L) 05/24/2021   ALBUMIN 3.5 05/24/2021   BILITOT 0.5 05/24/2021   ALKPHOS 53 05/24/2021  AST 16 05/24/2021   ALT 17 05/24/2021   ANIONGAP 6 09/29/2021    CBG (last 3)  Recent Labs    10/01/21 2141 10/02/21 0825 10/02/21 1216  GLUCAP 157* 169* 154*      Coagulation Profile: No results for input(s): "INR", "PROTIME" in the last 168 hours.   Radiology Studies: I have personally reviewed the imaging studies  DG Chest 2 View  Result Date: 09/30/2021 IMPRESSION: 1. No radiographic evidence of acute cardiopulmonary disease. 2. Stable changes of partial left lung resection. 3. Central pulmonary arterial enlargement Electronically Signed   By: Helyn Numbers M.D.   On: 09/30/2021 00:22       Nasri Boakye M.D. [facility] 10/02/2021, 2:33 PM  Available via Epic secure chat 7am-7pm After 7 pm, please refer to night coverage provider listed on amion.

## 2021-10-03 DIAGNOSIS — E119 Type 2 diabetes mellitus without complications: Secondary | ICD-10-CM | POA: Diagnosis not present

## 2021-10-03 DIAGNOSIS — J9601 Acute respiratory failure with hypoxia: Secondary | ICD-10-CM | POA: Diagnosis not present

## 2021-10-03 DIAGNOSIS — J441 Chronic obstructive pulmonary disease with (acute) exacerbation: Secondary | ICD-10-CM | POA: Diagnosis not present

## 2021-10-03 DIAGNOSIS — G8921 Chronic pain due to trauma: Secondary | ICD-10-CM | POA: Diagnosis not present

## 2021-10-03 LAB — GLUCOSE, CAPILLARY
Glucose-Capillary: 179 mg/dL — ABNORMAL HIGH (ref 70–99)
Glucose-Capillary: 102 mg/dL — ABNORMAL HIGH (ref 70–99)

## 2021-10-03 MED ORDER — PREDNISONE 20 MG PO TABS: 40.0000 mg | ORAL_TABLET | Freq: Every day | ORAL | 0 refills | Status: AC

## 2021-10-03 MED ORDER — AMLODIPINE BESYLATE 10 MG PO TABS: 10.0000 mg | ORAL_TABLET | Freq: Every day | ORAL | 3 refills | Status: AC

## 2021-10-03 MED ORDER — METFORMIN HCL 500 MG PO TABS
500.0000 mg | ORAL_TABLET | Freq: Two times a day (BID) | ORAL | 3 refills | Status: AC
Start: 2021-10-03 — End: ?

## 2021-10-03 MED ORDER — HYDROCOD POLI-CHLORPHE POLI ER 10-8 MG/5ML PO SUER: 5.0000 mL | Freq: Two times a day (BID) | ORAL | 0 refills | Status: DC | PRN

## 2021-10-03 MED ORDER — BENZONATATE 200 MG PO CAPS: 200.0000 mg | ORAL_CAPSULE | Freq: Three times a day (TID) | ORAL | 0 refills | Status: DC | PRN

## 2021-10-03 MED ORDER — AZITHROMYCIN 500 MG PO TABS: 500.0000 mg | ORAL_TABLET | Freq: Every day | ORAL | 0 refills | Status: AC

## 2021-10-03 NOTE — Discharge Summary (Signed)
Physician Discharge Summary   Patient: Glenn [name] MRN: [id number] DOB: [birth year]  Admit date:     09/29/2021  Discharge date: 10/03/21  Discharge Physician: Estill Cotta, MD    PCP: Nolene Ebbs, MD   Recommendations at discharge:   Continue prednisone 40 mg daily for 5 days Continue Zithromax 500 mg daily for 3 more days Home O2 evaluation prior to discharge Placed on Tessalon Perles as needed and Tussionex as needed for cough  Discharge Diagnoses:    Acute respiratory failure with hypoxia ([place])   COPD with acute exacerbation ([place])   Diabetes mellitus type 2 in nonobese ([place]), uncontrolled with hyperglycemia   Hyperlipidemia   Hypertension   History of CVA (cerebrovascular accident)   Personal history of sarcoidosis   Chronic pain syndrome    Hospital Course:  Patient is a 61 year old male with hypertension, HLP, CVA in 2002, COPD/asthma, sarcoidosis, follows Dr. Arrie Eastern, chronic pain, partial lung resection presented to ED with shortness of breath and cough for last 3 weeks.  Patient reports wheezing, nausea for coughing.  No leg swelling, chest pain or any recent travels.  Normally not on O2 at baseline.  Quit smoking last year.  Second admission this year for COPD exacerbation. In ED, BP was elevated 177/117, O2 sats low as 88% with improvement on 2 L. Patient was given Solu-Medrol IV, magnesium sulfate, continuous albuterol breathing treatment and admitted for further work-up.  Chest x-ray did not show any infiltrates.  COVID-19, flu screening negative   Assessment and Plan:     Acute respiratory failure with hypoxia ([place]) secondary to COPD with acute exacerbation -At baseline not on O2, O2 sats in ED 88% and was placed on 2 L -Patient was placed on IV Solu-Medrol, Zithromax, Singulair, Brovana, Pulmicort -Pulmonology was consulted -Transition to oral prednisone 40 mg daily for 5 days -Continue albuterol, Symbicort, Singulair, Claritin -Home O2 evaluation prior to  discharge -Needs outpatient follow-up with his pulmonologist, Dr. Melvyn Novas        Diabetes mellitus type 2 in nonobese [facility]), NIIDM uncontrolled with hyperglycemia  - CBGs likely elevated due to steroids, -hemoglobin A1c 7.6 on 04/20/2021 -Allergic to insulin, increase metformin to 500 mg twice daily       Hyperlipidemia -Continue atorvastatin     Hypertension urgency -Elevated BP 177/117 on admission -Now improving, continue amlodipine, losartan/HCTZ     History of CVA (cerebrovascular accident) in May 2002 -Continue Plavix, statin     Personal history of sarcoidosis -No acute issues     Chronic pain syndrome -Continue oxycodone as needed   GERD -Continue PPI      Pain control - [place] Controlled Substance Reporting System database was reviewed. and patient was instructed, not to drive, operate heavy machinery, perform activities at heights, swimming or participation in water activities or provide baby-sitting services while on Pain, Sleep and Anxiety Medications; until their outpatient Physician has advised to do so again. Also recommended to not to take more than prescribed Pain, Sleep and Anxiety Medications.  Consultants: Pulmonology Procedures performed: None Disposition: Home Diet recommendation:  Discharge Diet Orders (From admission, onward)     Start     Ordered   10/03/21 0000  Diet - low sodium heart healthy        10/03/21 1008           Carb modified diet DISCHARGE MEDICATION: Allergies as of 10/03/2021       Reactions   Insulins Swelling   04/2020. Do not use  Novolog insulin. Swelling of tongue   Ibuprofen Other (See Comments)   Abdominal sensitivity   Pork-derived Products Other (See Comments)   Cultural-Muslim   Shrimp [shellfish Allergy] Itching   Tylenol [acetaminophen] Nausea Only   Abdominal sensitvity   Lisinopril Swelling, Rash   Tongue swelling        Medication List     TAKE these medications    albuterol 108 (90  Base) MCG/ACT inhaler Commonly known as: ProAir HFA 2 puffs every 4 hours as needed only  if your can't catch your breath What changed:  how much to take how to take this when to take this additional instructions   albuterol (2.5 MG/3ML) 0.083% nebulizer solution Commonly known as: PROVENTIL Take 3 mLs (2.5 mg total) by nebulization every 4 (four) hours as needed for wheezing or shortness of breath. What changed:  when to take this additional instructions   Amitiza 24 MCG capsule Generic drug: lubiprostone Take 24 mcg by mouth daily as needed for constipation.   amLODipine 10 MG tablet Commonly known as: NORVASC Take 1 tablet (10 mg total) by mouth daily.   atorvastatin 40 MG tablet Commonly known as: LIPITOR Take 1 tablet (40 mg total) by mouth daily.   azithromycin 500 MG tablet Commonly known as: ZITHROMAX Take 1 tablet (500 mg total) by mouth daily for 3 days. Start taking on: October 04, 2021 What changed:  medication strength how much to take   benzonatate 200 MG capsule Commonly known as: TESSALON Take 1 capsule (200 mg total) by mouth 3 (three) times daily as needed for cough.   budesonide-formoterol 160-4.5 MCG/ACT inhaler Commonly known as: Symbicort Inhale 2 puffs into the lungs 2 (two) times daily.   cetirizine 10 MG tablet Commonly known as: ZyrTEC Allergy Take 1 tablet (10 mg total) by mouth at bedtime. What changed:  when to take this reasons to take this   chlorpheniramine-HYDROcodone 10-8 MG/5ML Commonly known as: TUSSIONEX Take 5 mLs by mouth every 12 (twelve) hours as needed for cough.   clopidogrel 75 MG tablet Commonly known as: PLAVIX Take 75 mg by mouth daily.   diclofenac Sodium 1 % Gel Commonly known as: VOLTAREN Apply 1 application. topically daily as needed for pain.   DULoxetine 30 MG capsule Commonly known as: CYMBALTA Take 30 mg by mouth daily.   ferrous sulfate 325 (65 FE) MG EC tablet Take 325 mg by mouth daily with  breakfast.   gabapentin 300 MG capsule Commonly known as: Neurontin Take 1 capsule (300 mg total) by mouth 3 (three) times daily. What changed:  how much to take when to take this   hydrOXYzine 50 MG capsule Commonly known as: VISTARIL Take 50 mg by mouth at bedtime.   losartan-hydrochlorothiazide 100-12.5 MG tablet Commonly known as: HYZAAR Take 1 tablet by mouth daily.   metFORMIN 500 MG tablet Commonly known as: GLUCOPHAGE Take 1 tablet (500 mg total) by mouth 2 (two) times daily with a meal. What changed: when to take this   montelukast 10 MG tablet Commonly known as: SINGULAIR TAKE 1 TABLET BY MOUTH EVERYDAY AT BEDTIME What changed: See the new instructions.   naloxone 4 MG/0.1ML Liqd nasal spray kit Commonly known as: NARCAN SMARTSIG:Both Nares   nitroGLYCERIN 0.4 MG SL tablet Commonly known as: NITROSTAT Place 0.4 mg under the tongue every 5 (five) minutes as needed for chest pain.   Oxycodone HCl 20 MG Tabs Take 20 mg by mouth in the morning, at noon, in the  evening, and at bedtime.   pantoprazole 40 MG tablet Commonly known as: PROTONIX Take 1 tablet (40 mg total) by mouth daily.   predniSONE 20 MG tablet Commonly known as: DELTASONE Take 2 tablets (40 mg total) by mouth daily with breakfast for 5 days. Start taking on: October 04, 2021 What changed:  medication strength how much to take when to take this reasons to take this   sildenafil 100 MG tablet Commonly known as: VIAGRA Take 1 tablet (100 mg total) by mouth daily as needed. What changed: reasons to take this   tamsulosin 0.4 MG Caps capsule Commonly known as: FLOMAX Take 0.4 mg by mouth daily.   VITAMIN C PO Take 2 tablets by mouth daily.   Vitamin D (Ergocalciferol) 1.25 MG (50000 UNIT) Caps capsule Commonly known as: DRISDOL Take 50,000 Units by mouth once a week. Mondays   Xtampza ER 9 MG C12a Generic drug: oxyCODONE ER Take 9 mg by mouth 2 (two) times daily.         Follow-up Information     Tanda Rockers, MD. Schedule an appointment as soon as possible for a visit in 2 week(s).   Specialty: Pulmonary Disease Why: for hospital follow-up Contact information: [place] [address] [phone]         Nolene Ebbs, MD. Schedule an appointment as soon as possible for a visit in 2 week(s).   Specialty: Internal Medicine Why: for hospital follow-up Contact information: [address] [place] [address] [phone]                Discharge Exam: Danley Danker Weights   09/30/21 0811  Weight: 68 kg   S: Doing well, no fevers or wheezing.  Shortness of breath is improving, looking forward to go home.  Vitals:   10/02/21 2003 10/02/21 2129 10/03/21 0823 10/03/21 0846  BP:  (!) 144/105  (!) 148/91  Pulse:  89 90 89  Resp:  20 15   Temp:  97.9 F (36.6 C)  98.4 F (36.9 C)  TempSrc:  Oral  Oral  SpO2: 95%  97%   Weight:      Height:        Physical Exam General: Alert and oriented x 3, NAD Cardiovascular: S1 S2 clear, RRR.  Respiratory: CTAB, no wheezing, rales or rhonchi Gastrointestinal: Soft, nontender, nondistended, NBS Ext: no pedal edema bilaterally Neuro: no new deficits  Condition at discharge: good  The results of significant diagnostics from this hospitalization (including imaging, microbiology, ancillary and laboratory) are listed below for reference.   Imaging Studies: DG Chest 2 View  Result Date: 09/30/2021 CLINICAL DATA:  Chest pain, dyspnea EXAM: CHEST - 2 VIEW COMPARISON:  08/16/2021 FINDINGS: Stable elevation of the left hemidiaphragm. Partial left lung resection again noted with left-sided volume loss. No superimposed confluent pulmonary infiltrate. No pneumothorax or pleural effusion. Cardiac size within normal limits. Central pulmonary arterial enlargement is again noted in keeping with changes of pulmonary arterial hypertension. No acute bone abnormality. IMPRESSION: 1. No  radiographic evidence of acute cardiopulmonary disease. 2. Stable changes of partial left lung resection. 3. Central pulmonary arterial enlargement Electronically Signed   By: Fidela Salisbury M.D.   On: 09/30/2021 00:22    Microbiology: Results for orders placed or performed during the hospital encounter of 09/29/21  Resp Panel by RT-PCR (Flu A&B, Covid) Anterior Nasal Swab     Status: None   Collection Time: 09/30/21  3:15 AM   Specimen:  Anterior Nasal Swab  Result Value Ref Range Status   SARS Coronavirus 2 by RT PCR NEGATIVE NEGATIVE Final    Comment: (NOTE) SARS-CoV-2 target nucleic acids are NOT DETECTED.  The SARS-CoV-2 RNA is generally detectable in upper respiratory specimens during the acute phase of infection. The lowest concentration of SARS-CoV-2 viral copies this assay can detect is 138 copies/mL. A negative result does not preclude SARS-Cov-2 infection and should not be used as the sole basis for treatment or other patient management decisions. A negative result may occur with  improper specimen collection/handling, submission of specimen other than nasopharyngeal swab, presence of viral mutation(s) within the areas targeted by this assay, and inadequate number of viral copies(<138 copies/mL). A negative result must be combined with clinical observations, patient history, and epidemiological information. The expected result is Negative.  Fact Sheet for Patients:  EntrepreneurPulse.com.au  Fact Sheet for Healthcare Providers:  IncredibleEmployment.be  This test is no t yet approved or cleared by the [place] FDA and  has been authorized for detection and/or diagnosis of SARS-CoV-2 by FDA under an Emergency Use Authorization (EUA). This EUA will remain  in effect (meaning this test can be used) for the duration of the COVID-19 declaration under Section 564(b)(1) of the Act, 21 U.S.C.section 360bbb-3(b)(1), unless the authorization  is terminated  or revoked sooner.       Influenza A by PCR NEGATIVE NEGATIVE Final   Influenza B by PCR NEGATIVE NEGATIVE Final    Comment: (NOTE) The Xpert Xpress SARS-CoV-2/FLU/RSV plus assay is intended as an aid in the diagnosis of influenza from Nasopharyngeal swab specimens and should not be used as a sole basis for treatment. Nasal washings and aspirates are unacceptable for Xpert Xpress SARS-CoV-2/FLU/RSV testing.  Fact Sheet for Patients: EntrepreneurPulse.com.au  Fact Sheet for Healthcare Providers: IncredibleEmployment.be  This test is not yet approved or cleared by the [place] FDA and has been authorized for detection and/or diagnosis of SARS-CoV-2 by FDA under an Emergency Use Authorization (EUA). This EUA will remain in effect (meaning this test can be used) for the duration of the COVID-19 declaration under Section 564(b)(1) of the Act, 21 U.S.C. section 360bbb-3(b)(1), unless the authorization is terminated or revoked.  Performed at [facility], [place] [address], [place], [place] [address]     Labs: CBC: Recent Labs  Lab 09/29/21 2358 09/30/21 0239  WBC 7.8  --   HGB 13.6 15.0  HCT 42.4 44.0  MCV 88.5  --   PLT 225  --    Basic Metabolic Panel: Recent Labs  Lab 09/29/21 2358 09/30/21 0239  NA 139 139  K 3.6 3.6  CL 105  --   CO2 28  --   GLUCOSE 113*  --   BUN 14  --   CREATININE 0.85  --   CALCIUM 9.3  --    Liver Function Tests: No results for input(s): "AST", "ALT", "ALKPHOS", "BILITOT", "PROT", "ALBUMIN" in the last 168 hours. CBG: Recent Labs  Lab 10/02/21 0825 10/02/21 1216 10/02/21 1838 10/02/21 2016 10/03/21 0847  GLUCAP 169* 154* 261* 254* 179*    Discharge time spent: greater than 30 minutes.  Signed: Estill Cotta, MD [facility] 10/03/2021

## 2021-10-03 NOTE — Progress Notes (Signed)
SATURATION QUALIFICATIONS: (This note is used to comply with regulatory documentation for home oxygen)  Patient Saturations on Room Air at Rest = 92%  Patient Saturations on Room Air while Ambulating = 88-91%    Please briefly explain why patient needs home oxygen: Pt was able to walk independently, room air. Pt was not needing O2, he was not in distress and saturation between 88-91% (he bumps up to 90's when told to deep breathe properly). MD notified.

## 2021-10-05 ENCOUNTER — Other Ambulatory Visit (HOSPITAL_COMMUNITY): Payer: Self-pay

## 2021-10-16 ENCOUNTER — Ambulatory Visit (HOSPITAL_BASED_OUTPATIENT_CLINIC_OR_DEPARTMENT_OTHER)
Admission: RE | Admit: 2021-10-16 | Discharge: 2021-10-16 | Disposition: A | Discharge status: Home / Self Care | Payer: Medicaid Other | Source: Ambulatory Visit | Attending: Acute Care | Admitting: Acute Care

## 2021-10-16 DIAGNOSIS — Z122 Encounter for screening for malignant neoplasm of respiratory organs: Secondary | ICD-10-CM | POA: Insufficient documentation

## 2021-10-16 DIAGNOSIS — Z87891 Personal history of nicotine dependence: Secondary | ICD-10-CM | POA: Insufficient documentation

## 2021-10-19 ENCOUNTER — Other Ambulatory Visit: Payer: Self-pay | Admitting: Acute Care

## 2021-10-19 DIAGNOSIS — Z122 Encounter for screening for malignant neoplasm of respiratory organs: Secondary | ICD-10-CM

## 2021-10-19 DIAGNOSIS — Z87891 Personal history of nicotine dependence: Secondary | ICD-10-CM

## 2021-11-01 ENCOUNTER — Other Ambulatory Visit (HOSPITAL_COMMUNITY): Payer: Self-pay

## 2021-11-09 ENCOUNTER — Other Ambulatory Visit (HOSPITAL_COMMUNITY): Payer: Self-pay

## 2021-11-17 ENCOUNTER — Encounter (HOSPITAL_COMMUNITY): Payer: Self-pay

## 2021-11-17 ENCOUNTER — Inpatient Hospital Stay (HOSPITAL_COMMUNITY)
Admission: EM | Admit: 2021-11-17 | Discharge: 2021-11-19 | DRG: 192 | Disposition: A | Discharge status: Home / Self Care | Payer: Medicaid Other | Attending: Internal Medicine | Admitting: Internal Medicine

## 2021-11-17 ENCOUNTER — Emergency Department (HOSPITAL_COMMUNITY): Payer: Medicaid Other

## 2021-11-17 ENCOUNTER — Other Ambulatory Visit: Payer: Self-pay

## 2021-11-17 DIAGNOSIS — Z79891 Long term (current) use of opiate analgesic: Secondary | ICD-10-CM | POA: Diagnosis not present

## 2021-11-17 DIAGNOSIS — J9601 Acute respiratory failure with hypoxia: Secondary | ICD-10-CM

## 2021-11-17 DIAGNOSIS — Z886 Allergy status to analgesic agent status: Secondary | ICD-10-CM

## 2021-11-17 DIAGNOSIS — Z8249 Family history of ischemic heart disease and other diseases of the circulatory system: Secondary | ICD-10-CM

## 2021-11-17 DIAGNOSIS — G894 Chronic pain syndrome: Secondary | ICD-10-CM | POA: Diagnosis present

## 2021-11-17 DIAGNOSIS — M1990 Unspecified osteoarthritis, unspecified site: Secondary | ICD-10-CM | POA: Diagnosis present

## 2021-11-17 DIAGNOSIS — Z833 Family history of diabetes mellitus: Secondary | ICD-10-CM | POA: Diagnosis not present

## 2021-11-17 DIAGNOSIS — Z79899 Other long term (current) drug therapy: Secondary | ICD-10-CM | POA: Diagnosis not present

## 2021-11-17 DIAGNOSIS — Z91013 Allergy to seafood: Secondary | ICD-10-CM

## 2021-11-17 DIAGNOSIS — I10 Essential (primary) hypertension: Secondary | ICD-10-CM | POA: Diagnosis present

## 2021-11-17 DIAGNOSIS — I152 Hypertension secondary to endocrine disorders: Secondary | ICD-10-CM | POA: Diagnosis present

## 2021-11-17 DIAGNOSIS — J441 Chronic obstructive pulmonary disease with (acute) exacerbation: Secondary | ICD-10-CM | POA: Diagnosis present

## 2021-11-17 DIAGNOSIS — F32A Depression, unspecified: Secondary | ICD-10-CM | POA: Diagnosis present

## 2021-11-17 DIAGNOSIS — F419 Anxiety disorder, unspecified: Secondary | ICD-10-CM | POA: Diagnosis present

## 2021-11-17 DIAGNOSIS — G8921 Chronic pain due to trauma: Secondary | ICD-10-CM | POA: Diagnosis not present

## 2021-11-17 DIAGNOSIS — E785 Hyperlipidemia, unspecified: Secondary | ICD-10-CM | POA: Diagnosis present

## 2021-11-17 DIAGNOSIS — N400 Enlarged prostate without lower urinary tract symptoms: Secondary | ICD-10-CM | POA: Diagnosis present

## 2021-11-17 DIAGNOSIS — K219 Gastro-esophageal reflux disease without esophagitis: Secondary | ICD-10-CM | POA: Diagnosis present

## 2021-11-17 DIAGNOSIS — Z91014 Allergy to mammalian meats: Secondary | ICD-10-CM

## 2021-11-17 DIAGNOSIS — Z87891 Personal history of nicotine dependence: Secondary | ICD-10-CM

## 2021-11-17 DIAGNOSIS — Z888 Allergy status to other drugs, medicaments and biological substances status: Secondary | ICD-10-CM

## 2021-11-17 DIAGNOSIS — E119 Type 2 diabetes mellitus without complications: Secondary | ICD-10-CM | POA: Diagnosis present

## 2021-11-17 DIAGNOSIS — D869 Sarcoidosis, unspecified: Secondary | ICD-10-CM | POA: Diagnosis present

## 2021-11-17 DIAGNOSIS — Z8673 Personal history of transient ischemic attack (TIA), and cerebral infarction without residual deficits: Secondary | ICD-10-CM | POA: Diagnosis not present

## 2021-11-17 DIAGNOSIS — Z1152 Encounter for screening for COVID-19: Secondary | ICD-10-CM

## 2021-11-17 DIAGNOSIS — R0602 Shortness of breath: Principal | ICD-10-CM

## 2021-11-17 DIAGNOSIS — R053 Chronic cough: Secondary | ICD-10-CM | POA: Diagnosis present

## 2021-11-17 DIAGNOSIS — R0603 Acute respiratory distress: Secondary | ICD-10-CM | POA: Diagnosis not present

## 2021-11-17 DIAGNOSIS — Z7984 Long term (current) use of oral hypoglycemic drugs: Secondary | ICD-10-CM

## 2021-11-17 DIAGNOSIS — Z7902 Long term (current) use of antithrombotics/antiplatelets: Secondary | ICD-10-CM | POA: Diagnosis not present

## 2021-11-17 DIAGNOSIS — E1169 Type 2 diabetes mellitus with other specified complication: Secondary | ICD-10-CM | POA: Diagnosis present

## 2021-11-17 DIAGNOSIS — R030 Elevated blood-pressure reading, without diagnosis of hypertension: Secondary | ICD-10-CM

## 2021-11-17 DIAGNOSIS — G8929 Other chronic pain: Secondary | ICD-10-CM | POA: Diagnosis present

## 2021-11-17 LAB — I-STAT VENOUS BLOOD GAS, ED
Acid-Base Excess: 3 mmol/L — ABNORMAL HIGH (ref 0.0–2.0)
Bicarbonate: 28.4 mmol/L — ABNORMAL HIGH (ref 20.0–28.0)
Calcium, Ion: 1.11 mmol/L — ABNORMAL LOW (ref 1.15–1.40)
HCT: 47 % (ref 39.0–52.0)
Hemoglobin: 16 g/dL (ref 13.0–17.0)
O2 Saturation: 99 %
Potassium: 3.7 mmol/L (ref 3.5–5.1)
Sodium: 137 mmol/L (ref 135–145)
TCO2: 30 mmol/L (ref 22–32)
pCO2, Ven: 44.2 mmHg (ref 44–60)
pH, Ven: 7.416 (ref 7.25–7.43)
pO2, Ven: 153 mmHg — ABNORMAL HIGH (ref 32–45)

## 2021-11-17 LAB — CBC WITH DIFFERENTIAL/PLATELET
Abs Immature Granulocytes: 0.01 10*3/uL (ref 0.00–0.07)
Basophils Absolute: 0.1 10*3/uL (ref 0.0–0.1)
Basophils Relative: 1 %
Eosinophils Absolute: 0.5 10*3/uL (ref 0.0–0.5)
Eosinophils Relative: 8 %
HCT: 42.4 % (ref 39.0–52.0)
Hemoglobin: 14 g/dL (ref 13.0–17.0)
Immature Granulocytes: 0 %
Lymphocytes Relative: 45 %
Lymphs Abs: 2.9 10*3/uL (ref 0.7–4.0)
MCH: 29.4 pg (ref 26.0–34.0)
MCHC: 33 g/dL (ref 30.0–36.0)
MCV: 88.9 fL (ref 80.0–100.0)
Monocytes Absolute: 0.8 10*3/uL (ref 0.1–1.0)
Monocytes Relative: 12 %
Neutro Abs: 2.3 10*3/uL (ref 1.7–7.7)
Neutrophils Relative %: 34 %
Platelets: 249 10*3/uL (ref 150–400)
RBC: 4.77 MIL/uL (ref 4.22–5.81)
RDW: 14 % (ref 11.5–15.5)
WBC: 6.6 10*3/uL (ref 4.0–10.5)
nRBC: 0 % (ref 0.0–0.2)

## 2021-11-17 LAB — RAPID URINE DRUG SCREEN, HOSP PERFORMED
Amphetamines: NOT DETECTED
Barbiturates: NOT DETECTED
Benzodiazepines: NOT DETECTED
Cocaine: NOT DETECTED
Opiates: NOT DETECTED
Tetrahydrocannabinol: NOT DETECTED

## 2021-11-17 LAB — BASIC METABOLIC PANEL
Anion gap: 7 (ref 5–15)
BUN: 6 mg/dL — ABNORMAL LOW (ref 8–23)
CO2: 29 mmol/L (ref 22–32)
Calcium: 9.3 mg/dL (ref 8.9–10.3)
Chloride: 103 mmol/L (ref 98–111)
Creatinine, Ser: 0.79 mg/dL (ref 0.61–1.24)
GFR, Estimated: >60 mL/min (ref >60)
Glucose, Bld: 88 mg/dL (ref 70–99)
Potassium: 3.7 mmol/L (ref 3.5–5.1)
Sodium: 139 mmol/L (ref 135–145)

## 2021-11-17 LAB — TROPONIN I (HIGH SENSITIVITY)
Troponin I (High Sensitivity): 8 ng/L (ref <18)
Troponin I (High Sensitivity): 8 ng/L (ref <18)

## 2021-11-17 LAB — SARS CORONAVIRUS 2 BY RT PCR: SARS Coronavirus 2 by RT PCR: NEGATIVE

## 2021-11-17 LAB — BRAIN NATRIURETIC PEPTIDE: B Natriuretic Peptide: 23.3 pg/mL (ref 0.0–100.0)

## 2021-11-17 MED ORDER — HYDRALAZINE HCL 20 MG/ML IJ SOLN: 5.0000 mg | INTRAMUSCULAR | Status: DC | PRN

## 2021-11-17 MED ORDER — LOSARTAN POTASSIUM 50 MG PO TABS
100.0000 mg | ORAL_TABLET | Freq: Every day | ORAL | Status: DC
  Administered 2021-11-17 – 2021-11-19 (×3): 100 mg via ORAL
  Filled 2021-11-17 (×3): qty 2

## 2021-11-17 MED ORDER — OXYCODONE HCL ER 10 MG PO T12A
10.0000 mg | EXTENDED_RELEASE_TABLET | Freq: Two times a day (BID) | ORAL | Status: DC
  Administered 2021-11-17 – 2021-11-18 (×2): 10 mg via ORAL
  Filled 2021-11-17 (×2): qty 1

## 2021-11-17 MED ORDER — MONTELUKAST SODIUM 10 MG PO TABS
10.0000 mg | ORAL_TABLET | Freq: Every day | ORAL | Status: DC
  Administered 2021-11-17 – 2021-11-18 (×2): 10 mg via ORAL
  Filled 2021-11-17 (×2): qty 1

## 2021-11-17 MED ORDER — AMLODIPINE BESYLATE 5 MG PO TABS: 5.0000 mg | ORAL_TABLET | Freq: Once | ORAL | Status: DC

## 2021-11-17 MED ORDER — METHYLPREDNISOLONE SODIUM SUCC 125 MG IJ SOLR
60.0000 mg | Freq: Two times a day (BID) | INTRAMUSCULAR | Status: AC
  Administered 2021-11-17 – 2021-11-18 (×2): 60 mg via INTRAVENOUS
  Filled 2021-11-17 (×2): qty 2

## 2021-11-17 MED ORDER — ACETAMINOPHEN 325 MG PO TABS: 650.0000 mg | ORAL_TABLET | Freq: Four times a day (QID) | ORAL | Status: DC | PRN

## 2021-11-17 MED ORDER — CLOPIDOGREL BISULFATE 75 MG PO TABS
75.0000 mg | ORAL_TABLET | Freq: Every day | ORAL | Status: DC
  Administered 2021-11-18 – 2021-11-19 (×2): 75 mg via ORAL
  Filled 2021-11-17 (×2): qty 1

## 2021-11-17 MED ORDER — IPRATROPIUM-ALBUTEROL 0.5-2.5 (3) MG/3ML IN SOLN
3.0000 mL | RESPIRATORY_TRACT | Status: AC
  Administered 2021-11-17 (×3): 3 mL via RESPIRATORY_TRACT
  Filled 2021-11-17: qty 9

## 2021-11-17 MED ORDER — ENOXAPARIN SODIUM 40 MG/0.4ML IJ SOSY
40.0000 mg | PREFILLED_SYRINGE | INTRAMUSCULAR | Status: DC
  Administered 2021-11-17 – 2021-11-18 (×2): 40 mg via SUBCUTANEOUS
  Filled 2021-11-17 (×2): qty 0.4

## 2021-11-17 MED ORDER — LOSARTAN POTASSIUM-HCTZ 100-12.5 MG PO TABS: 1.0000 | ORAL_TABLET | Freq: Every day | ORAL | Status: DC

## 2021-11-17 MED ORDER — SODIUM CHLORIDE 0.9% FLUSH
3.0000 mL | Freq: Two times a day (BID) | INTRAVENOUS | Status: DC
  Administered 2021-11-17 – 2021-11-19 (×3): 3 mL via INTRAVENOUS

## 2021-11-17 MED ORDER — PREDNISONE 20 MG PO TABS: 40.0000 mg | ORAL_TABLET | Freq: Every day | ORAL | Status: DC

## 2021-11-17 MED ORDER — TAMSULOSIN HCL 0.4 MG PO CAPS
0.4000 mg | ORAL_CAPSULE | Freq: Every day | ORAL | Status: DC
  Administered 2021-11-18 – 2021-11-19 (×2): 0.4 mg via ORAL
  Filled 2021-11-17 (×2): qty 1

## 2021-11-17 MED ORDER — HYDROXYZINE HCL 25 MG PO TABS
50.0000 mg | ORAL_TABLET | Freq: Every day | ORAL | Status: DC
  Administered 2021-11-17: 50 mg via ORAL
  Filled 2021-11-17: qty 2

## 2021-11-17 MED ORDER — ONDANSETRON HCL 4 MG/2ML IJ SOLN: 4.0000 mg | Freq: Four times a day (QID) | INTRAMUSCULAR | Status: DC | PRN

## 2021-11-17 MED ORDER — PANTOPRAZOLE SODIUM 40 MG PO TBEC
40.0000 mg | DELAYED_RELEASE_TABLET | Freq: Every day | ORAL | Status: DC
  Administered 2021-11-17: 40 mg via ORAL
  Filled 2021-11-17: qty 1

## 2021-11-17 MED ORDER — MOMETASONE FURO-FORMOTEROL FUM 200-5 MCG/ACT IN AERO
2.0000 | INHALATION_SPRAY | Freq: Two times a day (BID) | RESPIRATORY_TRACT | Status: DC
  Filled 2021-11-17: qty 8.8

## 2021-11-17 MED ORDER — ACETAMINOPHEN 650 MG RE SUPP: 650.0000 mg | Freq: Four times a day (QID) | RECTAL | Status: DC | PRN

## 2021-11-17 MED ORDER — BISACODYL 5 MG PO TBEC: 5.0000 mg | DELAYED_RELEASE_TABLET | Freq: Every day | ORAL | Status: DC | PRN

## 2021-11-17 MED ORDER — LUBIPROSTONE 24 MCG PO CAPS: 24.0000 ug | ORAL_CAPSULE | Freq: Every day | ORAL | Status: DC | PRN

## 2021-11-17 MED ORDER — POLYETHYLENE GLYCOL 3350 17 G PO PACK: 17.0000 g | PACK | Freq: Every day | ORAL | Status: DC | PRN

## 2021-11-17 MED ORDER — IPRATROPIUM-ALBUTEROL 0.5-2.5 (3) MG/3ML IN SOLN
3.0000 mL | Freq: Once | RESPIRATORY_TRACT | Status: AC
  Administered 2021-11-17: 3 mL via RESPIRATORY_TRACT
  Filled 2021-11-17: qty 3

## 2021-11-17 MED ORDER — IPRATROPIUM-ALBUTEROL 0.5-2.5 (3) MG/3ML IN SOLN
3.0000 mL | Freq: Four times a day (QID) | RESPIRATORY_TRACT | Status: DC
  Administered 2021-11-17 – 2021-11-18 (×4): 3 mL via RESPIRATORY_TRACT
  Filled 2021-11-17 (×4): qty 3

## 2021-11-17 MED ORDER — ATORVASTATIN CALCIUM 40 MG PO TABS
40.0000 mg | ORAL_TABLET | Freq: Every day | ORAL | Status: DC
  Administered 2021-11-18 – 2021-11-19 (×2): 40 mg via ORAL
  Filled 2021-11-17 (×2): qty 1

## 2021-11-17 MED ORDER — AMLODIPINE BESYLATE 10 MG PO TABS
10.0000 mg | ORAL_TABLET | Freq: Every day | ORAL | Status: DC
  Administered 2021-11-17 – 2021-11-19 (×3): 10 mg via ORAL
  Filled 2021-11-17: qty 1
  Filled 2021-11-17 (×2): qty 2

## 2021-11-17 MED ORDER — ONDANSETRON HCL 4 MG PO TABS: 4.0000 mg | ORAL_TABLET | Freq: Four times a day (QID) | ORAL | Status: DC | PRN

## 2021-11-17 MED ORDER — GUAIFENESIN ER 600 MG PO TB12
600.0000 mg | ORAL_TABLET | Freq: Two times a day (BID) | ORAL | Status: DC | PRN
  Administered 2021-11-17 – 2021-11-18 (×2): 600 mg via ORAL
  Filled 2021-11-17 (×2): qty 1

## 2021-11-17 MED ORDER — DOCUSATE SODIUM 100 MG PO CAPS
100.0000 mg | ORAL_CAPSULE | Freq: Two times a day (BID) | ORAL | Status: DC
  Administered 2021-11-17 – 2021-11-18 (×3): 100 mg via ORAL
  Filled 2021-11-17 (×3): qty 1

## 2021-11-17 MED ORDER — GABAPENTIN 300 MG PO CAPS
600.0000 mg | ORAL_CAPSULE | Freq: Every day | ORAL | Status: DC
  Administered 2021-11-18 – 2021-11-19 (×2): 600 mg via ORAL
  Filled 2021-11-17 (×2): qty 2

## 2021-11-17 MED ORDER — ALBUTEROL SULFATE (2.5 MG/3ML) 0.083% IN NEBU
2.5000 mg | INHALATION_SOLUTION | RESPIRATORY_TRACT | Status: DC | PRN
  Administered 2021-11-18: 2.5 mg via RESPIRATORY_TRACT
  Filled 2021-11-17: qty 3

## 2021-11-17 MED ORDER — BENZONATATE 100 MG PO CAPS: 200.0000 mg | ORAL_CAPSULE | Freq: Three times a day (TID) | ORAL | Status: DC | PRN

## 2021-11-17 MED ORDER — OXYCODONE HCL 5 MG PO TABS
20.0000 mg | ORAL_TABLET | Freq: Four times a day (QID) | ORAL | Status: DC | PRN
  Administered 2021-11-17 – 2021-11-19 (×7): 20 mg via ORAL
  Filled 2021-11-17 (×8): qty 4

## 2021-11-17 MED ORDER — HYDROCHLOROTHIAZIDE 12.5 MG PO TABS
12.5000 mg | ORAL_TABLET | Freq: Every day | ORAL | Status: DC
  Administered 2021-11-17 – 2021-11-19 (×3): 12.5 mg via ORAL
  Filled 2021-11-17 (×3): qty 1

## 2021-11-17 MED ORDER — METHYLPREDNISOLONE SODIUM SUCC 125 MG IJ SOLR
125.0000 mg | Freq: Once | INTRAMUSCULAR | Status: AC
  Administered 2021-11-17: 125 mg via INTRAVENOUS
  Filled 2021-11-17: qty 2

## 2021-11-17 NOTE — H&P (Signed)
History and Physical    Patient: Glenn [name] [id number] DOB: Jul 20, 1960 DOA: 11/17/2021 DOS: the patient was seen and examined on 11/17/2021 PCP: Nolene Ebbs, MD  Patient coming from: Home - lives with Celesta Gentile Reather Laurence; NOK: Manuela Schwartz, [phone]   Chief Complaint: SOB  HPI: Glenn [name] is a 61 y.o. male with medical history significant of COPD/sarcoidosis; HTN; DM; CVA; and chronic pain presenting with SOB.  He was last admitted in 09/2021 for COPD exacerbation.  He was here in August for COPD, he was coughing "and it caused my asthma to flare up."  When he went home, he started feeling sick again.  He has been having the same issues since last hospitalization.  He saw his PCP in f/u but not recently.  He did call 2 days ago.  He was given cough syrup and he has been taking OTC syrups without improvement.   He was previously given hydrocodone and that was helpful so he asked for that.  Not on home O2.  Quit smoking almost 2 years ago.  He has been wheezing more lately.  He thinks maybe he has a cold.  He has an appointment with Dr. Melvyn Novas on Monday.    ER Course:  COPD exacerbation.  Given steroids and nebs.  3L [place] O2 currently but not on home O2, dropped into the 80s on presentation.  Negative troponin.       Review of Systems: As mentioned in the history of present illness. All other systems reviewed and are negative. Past Medical History:  Diagnosis Date   Anxiety    Arthritis    Asthma    Chronic pain 05/06/2020   COPD (chronic obstructive pulmonary disease) (HCC)    CVA (cerebral vascular accident) ([place])    2010; 06/2020   Depression    Diabetes mellitus type 2 in nonobese [facility])    Dyspnea    ED (erectile dysfunction)    Hyperlipidemia    Hypertension    Sarcoid    Tobacco dependence 05/06/2020   Past Surgical History:  Procedure Laterality Date   LUNG SURGERY     removed tissue   Social History:  reports that he quit smoking about 18 months ago. His  smoking use included cigarettes. He has a 34.00 pack-year smoking history. He has never been exposed to tobacco smoke. He has never used smokeless tobacco. He reports that he does not drink alcohol and does not use drugs.  Allergies  Allergen Reactions   Insulins Swelling    04/2020. Do not use Novolog insulin. Swelling of tongue   Ibuprofen Other (See Comments)    Abdominal sensitivity   Pork-Derived Products Other (See Comments)    Cultural-Muslim   Shrimp [Shellfish Allergy] Itching   Tylenol [Acetaminophen] Nausea Only    Abdominal sensitvity   Lisinopril Swelling and Rash    Tongue swelling    Family History  Problem Relation Age of Onset   Hypertension Mother    Diabetes Maternal Grandmother    Colon cancer Neg Hx    Esophageal cancer Neg Hx    Rectal cancer Neg Hx    Inflammatory bowel disease Neg Hx    Liver disease Neg Hx    Pancreatic cancer Neg Hx     Prior to Admission medications   Medication Sig Start Date End Date Taking? Authorizing Provider  albuterol (PROAIR HFA) 108 (90 Base) MCG/ACT inhaler 2 puffs every 4 hours as needed only  if your can't catch your breath Patient  taking differently: Inhale 2 puffs into the lungs in the morning, at noon, in the evening, and at bedtime. 11/11/20   Tanda Rockers, MD  albuterol (PROVENTIL) (2.5 MG/3ML) 0.083% nebulizer solution Take 3 mLs (2.5 mg total) by nebulization every 4 (four) hours as needed for wheezing or shortness of breath. Patient taking differently: Take 2.5 mg by nebulization See admin instructions. 2.5 mg nebulization by mouth  every hour as needed for SOB and wheezing 08/16/21   Tanda Rockers, MD  AMITIZA 24 MCG capsule Take 24 mcg by mouth daily as needed for constipation. 03/31/20   [provider]  amLODipine (NORVASC) 10 MG tablet Take 1 tablet (10 mg total) by mouth daily. 10/03/21   Rai, Vernelle Emerald, MD  Ascorbic Acid (VITAMIN C PO) Take 2 tablets by mouth daily.    [provider]   atorvastatin (LIPITOR) 40 MG tablet Take 1 tablet (40 mg total) by mouth daily. 06/26/20   Pokhrel, Corrie Mckusick, MD  benzonatate (TESSALON) 200 MG capsule Take 1 capsule (200 mg total) by mouth 3 (three) times daily as needed for cough. 10/03/21   Rai, Vernelle Emerald, MD  budesonide-formoterol (SYMBICORT) 160-4.5 MCG/ACT inhaler Inhale 2 puffs into the lungs 2 (two) times daily. 07/23/21   Tanda Rockers, MD  cetirizine (ZYRTEC ALLERGY) 10 MG tablet Take 1 tablet (10 mg total) by mouth at bedtime. Patient taking differently: Take 10 mg by mouth daily as needed for allergies. 06/09/20   Parrett, Fonnie Mu, NP  chlorpheniramine-HYDROcodone (TUSSIONEX) 10-8 MG/5ML Take 5 mLs by mouth every 12 (twelve) hours as needed for cough. 10/03/21   Rai, Vernelle Emerald, MD  clopidogrel (PLAVIX) 75 MG tablet Take 75 mg by mouth daily. 07/06/21   [provider]  diclofenac Sodium (VOLTAREN) 1 % GEL Apply 1 application. topically daily as needed for pain. 02/09/21   [provider]  DULoxetine (CYMBALTA) 30 MG capsule Take 30 mg by mouth daily. 08/30/21   [provider]  ferrous sulfate 325 (65 FE) MG EC tablet Take 325 mg by mouth daily with breakfast.    [provider]  gabapentin (NEURONTIN) 300 MG capsule Take 1 capsule (300 mg total) by mouth 3 (three) times daily. Patient taking differently: Take 600 mg by mouth daily. 02/09/18   Raylene Everts, MD  hydrOXYzine (VISTARIL) 50 MG capsule Take 50 mg by mouth at bedtime. 02/10/21   [provider]  losartan-hydrochlorothiazide (HYZAAR) 100-12.5 MG tablet Take 1 tablet by mouth daily. 09/03/21   [provider]  metFORMIN (GLUCOPHAGE) 500 MG tablet Take 1 tablet (500 mg total) by mouth 2 (two) times daily with a meal. 10/03/21   Rai, Ripudeep K, MD  montelukast (SINGULAIR) 10 MG tablet TAKE 1 TABLET BY MOUTH EVERYDAY AT BEDTIME Patient taking differently: Take 10 mg by mouth as needed (allergies). 05/11/21   Parrett, Fonnie Mu, NP   naloxone (NARCAN) nasal spray 4 mg/0.1 mL SMARTSIG:Both Nares 08/20/21   [provider]  nitroGLYCERIN (NITROSTAT) 0.4 MG SL tablet Place 0.4 mg under the tongue every 5 (five) minutes as needed for chest pain.    [provider]  Oxycodone HCl 20 MG TABS Take 20 mg by mouth in the morning, at noon, in the evening, and at bedtime. 03/31/20   [provider]  pantoprazole (PROTONIX) 40 MG tablet Take 1 tablet (40 mg total) by mouth daily. 07/02/19   Elgergawy, Silver Huguenin, MD  sildenafil (VIAGRA) 100 MG tablet Take 1 tablet (100  mg total) by mouth daily as needed. Patient taking differently: Take 100 mg by mouth daily as needed for erectile dysfunction. 05/24/21     tamsulosin (FLOMAX) 0.4 MG CAPS capsule Take 0.4 mg by mouth daily. 04/26/21   [provider]  Vitamin D, Ergocalciferol, (DRISDOL) 1.25 MG (50000 UNIT) CAPS capsule Take 50,000 Units by mouth once a week. Mondays 05/02/19   [provider]  XTAMPZA ER 9 MG C12A Take 9 mg by mouth 2 (two) times daily. 04/09/21   [provider]    Physical Exam: Vitals:   11/17/21 1627 11/17/21 1630 11/17/21 1745 11/17/21 1815  BP:  (!) 193/139 (!) 168/107 (!) 165/103  Pulse:  (!) 117 (!) 101 95  Resp:  (!) 22 (!) 21 (!) 24  Temp: 98.4 F (36.9 C)     TempSrc: Oral     SpO2:  99% 98% 97%  Weight:      Height:       General:  Appears calm and comfortable and is in NAD, on [place] O2 Eyes:  PERRL, EOMI, normal lids, iris ENT:  grossly normal hearing, lips & tongue, mmm Neck:  no LAD, masses or thyromegaly Cardiovascular:  RR with mild tachycardia, no m/r/g. No LE edema.  Respiratory:   Scattered wheezes with moderate air movement.  Normal to mildly increased respiratory effort. Abdomen:  soft, NT, ND Skin:  no rash or induration seen on limited exam Musculoskeletal:  grossly normal tone BUE/BLE, good ROM, no bony abnormality Psychiatric:  grossly normal mood and affect, speech fluent and appropriate,  AOx3 Neurologic:  CN 2-12 grossly intact, moves all extremities in coordinated fashion   Radiological Exams on Admission: Independently reviewed - see discussion in A/P where applicable  DG Chest 2 View  Result Date: 11/17/2021 CLINICAL DATA:  Shortness of breath.  Cough and congestion.  COPD. EXAM: CHEST - 2 VIEW COMPARISON:  Chest radiographs 09/30/2021 and CT 10/16/2021 FINDINGS: The cardiomediastinal silhouette is unchanged with normal heart size and chronic prominence of the central pulmonary arteries. Postsurgical changes are again noted in the left lung with associated volume loss and left hemidiaphragm elevation. Scarring is present in the mid and lower lungs bilaterally, and the prior CT demonstrated sequelae of pulmonary sarcoidosis including bronchiectasis. No acute airspace consolidation, overt pulmonary edema, pleural effusion, or pneumothorax is identified. No acute osseous abnormality is seen. IMPRESSION: Chronic lung changes without evidence of acute cardiopulmonary process. Electronically Signed   By: Logan Bores M.D.   On: 11/17/2021 11:39    EKG: Independently reviewed.  NSR with rate 100; no evidence of acute ischemia   Labs on Admission: I have personally reviewed the available labs and imaging studies at the time of the admission.  Pertinent labs:    VBG: 7.416/44.2/28.4 Unremarkable BMP BNP 23.3 HS troponin 8 Normal CBC   Assessment and Plan: Principal Problem:   COPD with acute exacerbation (HCC) Active Problems:   Hypertension   Chronic pain   Diabetes mellitus type 2 in nonobese (HCC)   Hyperlipidemia   History of CVA (cerebrovascular accident)    Assessment and Plan: No notes have been filed under this hospital service. Service: Hospitalist  Acute on chronic respiratory failure associated with a COPD exacerbation -Patient's shortness of breath is most likely caused by acute COPD exacerbation.  -He has not required home O2 in the past but reports  having persistent symptoms since last hospitalization and so may need this -He does not have fever or leukocytosis.  -Chest  x-ray is not consistent with pneumonia -will admit for now and attempt to wean off [place] O2 if possible -Nebulizers: scheduled Duoneb and prn albuterol -Solu-Medrol 80 mg IV BID -> Prednisone 40 mg PO daily -Hold antibiotics for now, as he has only 1 cardinal symptom -Continue Symbicort ([place] per formulary), Singulair (change to daily as prescribed instead of prn) -Coordinated care with [facility] team/PT/OT/Nutrition/RT consults -Mucinex and Tessalon for cough; narcotic cough syrup is risky in a patient already on high-dose narcotics -He is scheduled to f/u with Dr. Melvyn Novas next Monday  HTN -Continue amlodipine and losartan-HCTZ (although he alternates these at home)   Chronic pain -I have reviewed this patient in the [place] Controlled Substances Reporting System.  He is receiving medications from only one provider and appears to be taking them as prescribed. -Opiates were previously negative on his UDS, needs to be monitored closely; will recheck UDS -He is at exceedingly high risk of opioid misuse, diversion, or overdose.   DM -recent A1c was 7.6, suboptimal control -Hold home PO Glucophage -He reports a h/o tongue swelling with insulin so will not cover for now -Continue Neurontin  HLD -Continue atorvastatin  H/o CVA -Continue Plavix -No obvious deficits currently      Advance Care Planning:   Code Status: Full Code   Consults: TOC team  DVT Prophylaxis: Lovenox  Family Communication: None present; he was calling his fiancee as I left the room  Severity of Illness: The appropriate patient status for this patient is INPATIENT. Inpatient status is judged to be reasonable and necessary in order to provide the required intensity of service to ensure the patient's safety. The patient's presenting symptoms, physical exam findings, and initial radiographic and  laboratory data in the context of their chronic comorbidities is felt to place them at high risk for further clinical deterioration. Furthermore, it is not anticipated that the patient will be medically stable for discharge from the hospital within 2 midnights of admission.   * I certify that at the point of admission it is my clinical judgment that the patient will require inpatient hospital care spanning beyond 2 midnights from the point of admission due to high intensity of service, high risk for further deterioration and high frequency of surveillance required.*  Author: Karmen Bongo, MD 11/17/2021 6:57 PM  For on call review [email or web address].

## 2021-11-17 NOTE — ED Provider Triage Note (Signed)
Emergency Medicine Provider Triage Evaluation Note  Glenn [name] , a 61 y.o. male  was evaluated in triage.  Pt complains of shortness of breath ongoing times several weeks worsening over the past 1-2 days.  Patient has been using his nebulizer without relief of symptoms.  He reports history of COPD and sarcoidosis feels this is exacerbation of the.  He does report some associated central chest pain.  Review of Systems  Positive: Shortness of breath, chest pain Negative: Fever, chills, nausea, vomiting, diarrhea, extremity swelling or any additional concerns  Physical Exam  BP (!) 161/119 (BP Location: Right Arm)   Pulse (!) 104   Temp 98.5 F (36.9 C) (Oral)   Resp (!) 22   SpO2 97%  Gen:   Awake, no distress   Resp:  Normal effort, expiratory wheezing, no accessory muscle use MSK:   Moves extremities without difficulty no lower extremity edema Other:  Heart regular rate and rhythm  Medical Decision Making  Medically screening exam initiated at 10:30 AM.  Appropriate orders placed.  Jeanpaul Biehl Christofferson was informed that the remainder of the evaluation will be completed by another provider, this initial triage assessment does not replace that evaluation, and the importance of remaining in the ED until their evaluation is complete.    Note: Portions of this report may have been transcribed using voice recognition software. Every effort was made to ensure accuracy; however, inadvertent computerized transcription errors may still be present.    Deliah Boston, PA-C 11/17/21 1032

## 2021-11-17 NOTE — ED Provider Notes (Signed)
[facility] EMERGENCY DEPARTMENT Provider Note   CSN: [id number] Arrival date & time: 11/17/21  1015     History  Chief Complaint  Patient presents with   Shortness of Breath    Glenn [name] is a 61 y.o. male reports a history of COPD, asthma, sarcoidosis presenting for evaluation of shortness of breath.  Patient reports that he has chronic shortness of breath but is worse over the past several weeks and then significantly worse of the past 1-2 days.  He has been using his home nebulizer without improvement of his symptoms.  He feels this is very similar to prior COPD exacerbation.  He reports some associated central chest pain which is mild tightness without clear aggravating or alleviating factors has been ongoing for a few weeks without change.    Patient reports that the DuoNeb he received in the triage area helped with his symptoms but after some time his shortness of breath has returned.  Patient denies extremity swelling/color change, nausea, vomiting, fever, chills, diarrhea, numbness, tingling or any additional concerns. HPI     Home Medications Prior to Admission medications   Medication Sig Start Date End Date Taking? Authorizing Provider  albuterol (PROAIR HFA) 108 (90 Base) MCG/ACT inhaler 2 puffs every 4 hours as needed only  if your can't catch your breath Patient taking differently: Inhale 2 puffs into the lungs in the morning, at noon, in the evening, and at bedtime. 11/11/20   [name], MD  albuterol (PROVENTIL) (2.5 MG/3ML) 0.083% nebulizer solution Take 3 mLs (2.5 mg total) by nebulization every 4 (four) hours as needed for wheezing or shortness of breath. Patient taking differently: Take 2.5 mg by nebulization See admin instructions. 2.5 mg nebulization by mouth  every hour as needed for SOB and wheezing 08/16/21   [name], MD  AMITIZA 24 MCG capsule Take 24 mcg by mouth daily as needed for constipation. 03/31/20   [provider]  amLODipine (NORVASC) 10 MG tablet Take 1 tablet (10 mg total) by mouth daily. 10/03/21   Rai, Delene Ruffini, MD  Ascorbic Acid (VITAMIN C PO) Take 2 tablets by mouth daily.    [provider]  atorvastatin (LIPITOR) 40 MG tablet Take 1 tablet (40 mg total) by mouth daily. 06/26/20   Pokhrel, Rebekah Chesterfield, MD  benzonatate (TESSALON) 200 MG capsule Take 1 capsule (200 mg total) by mouth 3 (three) times daily as needed for cough. 10/03/21   Rai, Delene Ruffini, MD  budesonide-formoterol (SYMBICORT) 160-4.5 MCG/ACT inhaler Inhale 2 puffs into the lungs 2 (two) times daily. 07/23/21   [name], MD  cetirizine (ZYRTEC ALLERGY) 10 MG tablet Take 1 tablet (10 mg total) by mouth at bedtime. Patient taking differently: Take 10 mg by mouth daily as needed for allergies. 06/09/20   Parrett, Virgel Bouquet, NP  chlorpheniramine-HYDROcodone (TUSSIONEX) 10-8 MG/5ML Take 5 mLs by mouth every 12 (twelve) hours as needed for cough. 10/03/21   Rai, Delene Ruffini, MD  clopidogrel (PLAVIX) 75 MG tablet Take 75 mg by mouth daily. 07/06/21   [provider]  diclofenac Sodium (VOLTAREN) 1 % GEL Apply 1 application. topically daily as needed for pain. 02/09/21   [provider]  DULoxetine (CYMBALTA) 30 MG capsule Take 30 mg by mouth daily. 08/30/21   [provider]  ferrous sulfate 325 (65 FE) MG EC tablet Take 325 mg by mouth daily with breakfast.    [provider]  gabapentin (NEURONTIN) 300 MG  capsule Take 1 capsule (300 mg total) by mouth 3 (three) times daily. Patient taking differently: Take 600 mg by mouth daily. 02/09/18   Eustace Moore, MD  hydrOXYzine (VISTARIL) 50 MG capsule Take 50 mg by mouth at bedtime. 02/10/21   [provider]  losartan-hydrochlorothiazide (HYZAAR) 100-12.5 MG tablet Take 1 tablet by mouth daily. 09/03/21   [provider]  metFORMIN (GLUCOPHAGE) 500 MG tablet Take 1 tablet (500 mg total) by mouth 2 (two) times daily with a meal.  10/03/21   Rai, Ripudeep K, MD  montelukast (SINGULAIR) 10 MG tablet TAKE 1 TABLET BY MOUTH EVERYDAY AT BEDTIME Patient taking differently: Take 10 mg by mouth as needed (allergies). 05/11/21   Parrett, Virgel Bouquet, NP  naloxone (NARCAN) nasal spray 4 mg/0.1 mL SMARTSIG:Both Nares 08/20/21   [provider]  nitroGLYCERIN (NITROSTAT) 0.4 MG SL tablet Place 0.4 mg under the tongue every 5 (five) minutes as needed for chest pain.    [provider]  Oxycodone HCl 20 MG TABS Take 20 mg by mouth in the morning, at noon, in the evening, and at bedtime. 03/31/20   [provider]  pantoprazole (PROTONIX) 40 MG tablet Take 1 tablet (40 mg total) by mouth daily. 07/02/19   Elgergawy, Leana Roe, MD  sildenafil (VIAGRA) 100 MG tablet Take 1 tablet (100 mg total) by mouth daily as needed. Patient taking differently: Take 100 mg by mouth daily as needed for erectile dysfunction. 05/24/21     tamsulosin (FLOMAX) 0.4 MG CAPS capsule Take 0.4 mg by mouth daily. 04/26/21   [provider]  Vitamin D, Ergocalciferol, (DRISDOL) 1.25 MG (50000 UNIT) CAPS capsule Take 50,000 Units by mouth once a week. Mondays 05/02/19   [provider]  XTAMPZA ER 9 MG C12A Take 9 mg by mouth 2 (two) times daily. 04/09/21   [provider]      Allergies    Insulins, Ibuprofen, Pork-derived products, Shrimp [shellfish allergy], Tylenol [acetaminophen], and Lisinopril    Review of Systems   Review of Systems Ten systems are reviewed and are negative for acute change except as noted in the HPI  Physical Exam Updated Vital Signs BP (!) 193/139   Pulse (!) 117   Temp 98.4 F (36.9 C) (Oral)   Resp (!) 22   Ht 5\' 5"  (1.651 m)   Wt 68 kg   SpO2 99%   BMI 24.96 kg/m  Physical Exam Constitutional:      General: He is not in acute distress.    Appearance: Normal appearance. He is well-developed. He is not ill-appearing or diaphoretic.  HENT:     Head: Normocephalic and atraumatic.   Eyes:     General: Vision grossly intact. Gaze aligned appropriately.     Pupils: Pupils are equal, round, and reactive to light.  Neck:     Trachea: Trachea and phonation normal.  Cardiovascular:     Rate and Rhythm: Regular rhythm. Tachycardia present.  Pulmonary:     Effort: Pulmonary effort is normal. No respiratory distress.     Breath sounds: Wheezing present.  Abdominal:     General: There is no distension.     Palpations: Abdomen is soft.     Tenderness: There is no abdominal tenderness. There is no guarding or rebound.  Musculoskeletal:        General: Normal range of motion.     Cervical back: Normal range of motion.     Right lower leg: No tenderness. No  edema.     Left lower leg: No tenderness. No edema.  Skin:    General: Skin is warm and dry.  Neurological:     Mental Status: He is alert.     GCS: GCS eye subscore is 4. GCS verbal subscore is 5. GCS motor subscore is 6.     Comments: Speech is clear and goal oriented, follows commands Major Cranial nerves without deficit, no facial droop Moves extremities without ataxia, coordination intact  Psychiatric:        Behavior: Behavior normal.     ED Results / Procedures / Treatments   Labs (all labs ordered are listed, but only abnormal results are displayed) Labs Reviewed  BASIC METABOLIC PANEL - Abnormal; Notable for the following components:      Result Value   BUN 6 (*)    All other components within normal limits  I-STAT VENOUS BLOOD GAS, ED - Abnormal; Notable for the following components:   pO2, Ven 153 (*)    Bicarbonate 28.4 (*)    Acid-Base Excess 3.0 (*)    Calcium, Ion 1.11 (*)    All other components within normal limits  CBC WITH DIFFERENTIAL/PLATELET  BRAIN NATRIURETIC PEPTIDE  TROPONIN I (HIGH SENSITIVITY)  TROPONIN I (HIGH SENSITIVITY)    EKG EKG Interpretation  Date/Time:  Wednesday November 17 2021 10:32:36 EDT Ventricular Rate:  100 PR Interval:  130 QRS Duration: 74 QT  Interval:  342 QTC Calculation: 441 R Axis:   44 Text Interpretation: Normal sinus rhythm Nonspecific T wave abnormality Abnormal ECG When compared with ECG of 29-Sep-2021 23:49, PREVIOUS ECG IS PRESENT when compared to prior, slightly faster rate. No STEMI Confirmed by Theda Belfastegeler, Chris ([id number]) on 11/17/2021 5:02:48 PM  Radiology DG Chest 2 View  Result Date: 11/17/2021 CLINICAL DATA:  Shortness of breath.  Cough and congestion.  COPD. EXAM: CHEST - 2 VIEW COMPARISON:  Chest radiographs 09/30/2021 and CT 10/16/2021 FINDINGS: The cardiomediastinal silhouette is unchanged with normal heart size and chronic prominence of the central pulmonary arteries. Postsurgical changes are again noted in the left lung with associated volume loss and left hemidiaphragm elevation. Scarring is present in the mid and lower lungs bilaterally, and the prior CT demonstrated sequelae of pulmonary sarcoidosis including bronchiectasis. No acute airspace consolidation, overt pulmonary edema, pleural effusion, or pneumothorax is identified. No acute osseous abnormality is seen. IMPRESSION: Chronic lung changes without evidence of acute cardiopulmonary process. Electronically Signed   By: Sebastian AcheAllen  Grady M.D.   On: 11/17/2021 11:39    Procedures .Critical Care  Performed by: Bill SalinasMorelli, Zenovia Justman A, PA-C Authorized by: Bill SalinasMorelli, Zimere Dunlevy A, PA-C   Critical care provider statement:    Critical care time (minutes):  45   Critical care was necessary to treat or prevent imminent or life-threatening deterioration of the following conditions:  Respiratory failure   Critical care was time spent personally by me on the following activities:  Development of treatment plan with patient or surrogate, discussions with consultants, evaluation of patient's response to treatment, examination of patient, ordering and review of laboratory studies, ordering and review of radiographic studies, ordering and performing treatments and interventions, pulse  oximetry, re-evaluation of patient's condition, review of old charts, obtaining history from patient or surrogate and blood draw for specimens   Care discussed with: admitting provider       Medications Ordered in ED Medications  amLODipine (NORVASC) tablet 5 mg (has no administration in time range)  ipratropium-albuterol (DUONEB) 0.5-2.5 (3) MG/3ML nebulizer solution 3 mL (  3 mLs Nebulization Given 11/17/21 1041)  ipratropium-albuterol (DUONEB) 0.5-2.5 (3) MG/3ML nebulizer solution 3 mL (3 mLs Nebulization Given 11/17/21 1656)  methylPREDNISolone sodium succinate (SOLU-MEDROL) 125 mg/2 mL injection 125 mg (125 mg Intravenous Given 11/17/21 1635)    ED Course/ Medical Decision Making/ A&P Clinical Course as of 11/17/21 1719  Wed Nov 17, 2021  1658 I-Stat venous blood gas, [facility] ED only)(!) VBG without acidosis. [BM]  1658 Troponin I (High Sensitivity) Initial hesitancy troponin within normal limits.  In setting of over a week of chest pain no indication for delta troponin, low suspicion for ACS. [BM]  1658 Brain natriuretic peptide BNP within normal limits, doubt CHF [BM]  1658 Basic metabolic panel(!) BMP without emergent electrolyte derangement, AKI or gap [BM]  1658 CBC with Differential CBC without leukocytosis to suggest infectious process.  No anemia or thrombocytopenia. [BM]  1659 DG Chest 2 View I have personally reviewed and interpreted patient's two-view chest x-ray.  I do not appreciate any obvious PNA, PTX or effusion.  Scarring present, agree with radiologist interpretation. [BM]  1700 ED EKG I personally reviewed and interpreted patient's twelve-lead EKG I do not appreciate any obvious acute ischemic changes. [BM]    Clinical Course User Index [BM] Elizabeth Palau                           Medical Decision Making 61 year old male with history as above presented for evaluation of shortness of breath ongoing times several weeks but worsening in the past 1-2 days  despite using his home nebulizer.  On initial evaluation patient has bilateral wheezing and decreased movement.  He does report some mild chest tightness for the past few weeks as well which is unchanged.  Differential includes but not limited to COPD exacerbation, CHF, ACS, PE, dissection, PNA, anemia.  Amount and/or Complexity of Data Reviewed Independent Historian: spouse    Details: Additional history obtained from patient's wife at bedside External Data Reviewed: notes.    Details: I reviewed notes from prior ED visits and hospitalization on 09/29/2021.  Patient was admitted for COPD exacerbation with hypoxia.  Also noted patient had diabetes, hyperlipidemia, hypertensive urgency, prior CVA and history of sarcoidosis. Labs: ordered. Decision-making details documented in ED Course. Radiology: ordered. Decision-making details documented in ED Course. ECG/medicine tests: ordered. Decision-making details documented in ED Course.  Risk Prescription drug management. Decision regarding hospitalization. Risk Details:   I reassessed patient multiple times during this visit.  Initially saw this patient in triage he reports some relief with DuoNeb initially but symptoms returned while waiting in the lobby.  When he was brought back from the waiting room he had return of his shortness of breath and sats were upper 80s on 2 L nasal cannula he was increased to 5 L by nursing staff.  He is subsequently given 3 DuoNebs and Solu-Medrol after IV was started and his breathing improved, he is now satting at 95% on 2 L nasal cannula.  Patient and his wife report they do not use home O2.  He reports improvement of symptoms but has continued wheezing on auscultation.  He does not use oxygen at baseline.  Considering he has had 4 DuoNebs at this point he appears to have failed treatment and will need admission.  No indication for BiPAP at this point.  Patient is medically agreeable to admission to the hospital,  hospitalist team consulted. Case discussed w/ Dr. Rush Landmark who agrees with plan  of care.  Patient mated for COPD exacerbation, low suspicion for ACS, PE, PNA or other emergent causes of his shortness of breath.  Critical Care Total time providing critical care: 45 minutes  Consulted with hospitalist Dr. Lorin Mercy, patient was accepted for admission.  Note: Portions of this report may have been transcribed using voice recognition software. Every effort was made to ensure accuracy; however, inadvertent computerized transcription errors may still be present.         Final Clinical Impression(s) / ED Diagnoses Final diagnoses:  Shortness of breath  Acute hypoxemic respiratory failure (HCC)  COPD exacerbation (HCC)  Elevated blood pressure reading    Rx / DC Orders ED Discharge Orders     None         Gari Crown 11/17/21 1719    Tegeler, Gwenyth Allegra, MD 11/17/21 2105

## 2021-11-17 NOTE — ED Notes (Signed)
BMP redrawn

## 2021-11-17 NOTE — ED Triage Notes (Signed)
Reports sob and wheezing x 2-3 weeks.  Hx of Asthma COPD Sarcoidosis.

## 2021-11-18 ENCOUNTER — Inpatient Hospital Stay (HOSPITAL_COMMUNITY): Payer: Medicaid Other

## 2021-11-18 DIAGNOSIS — R0603 Acute respiratory distress: Secondary | ICD-10-CM

## 2021-11-18 DIAGNOSIS — G8921 Chronic pain due to trauma: Secondary | ICD-10-CM

## 2021-11-18 DIAGNOSIS — E119 Type 2 diabetes mellitus without complications: Secondary | ICD-10-CM

## 2021-11-18 LAB — ECHOCARDIOGRAM COMPLETE
AR max vel: 2.21 cm2
AV Area VTI: 2.35 cm2
AV Area mean vel: 2.07 cm2
AV Mean grad: 3 mmHg
AV Peak grad: 5.9 mmHg
Ao pk vel: 1.21 m/s
Area-P 1/2: 6.22 cm2
Calc EF: 44.4 %
Height: 65 in
S' Lateral: 3.2 cm
Single Plane A2C EF: 43.5 %
Single Plane A4C EF: 45.9 %
Weight: 2400 oz

## 2021-11-18 LAB — CBC
HCT: 44.2 % (ref 39.0–52.0)
Hemoglobin: 14.7 g/dL (ref 13.0–17.0)
MCH: 29.1 pg (ref 26.0–34.0)
MCHC: 33.3 g/dL (ref 30.0–36.0)
MCV: 87.4 fL (ref 80.0–100.0)
Platelets: 265 10*3/uL (ref 150–400)
RBC: 5.06 MIL/uL (ref 4.22–5.81)
RDW: 14 % (ref 11.5–15.5)
WBC: 6.7 10*3/uL (ref 4.0–10.5)
nRBC: 0 % (ref 0.0–0.2)

## 2021-11-18 LAB — BASIC METABOLIC PANEL
Anion gap: 12 (ref 5–15)
BUN: 12 mg/dL (ref 8–23)
CO2: 26 mmol/L (ref 22–32)
Calcium: 9.8 mg/dL (ref 8.9–10.3)
Chloride: 99 mmol/L (ref 98–111)
Creatinine, Ser: 1.03 mg/dL (ref 0.61–1.24)
GFR, Estimated: >60 mL/min (ref >60)
Glucose, Bld: 216 mg/dL — ABNORMAL HIGH (ref 70–99)
Potassium: 4.2 mmol/L (ref 3.5–5.1)
Sodium: 137 mmol/L (ref 135–145)

## 2021-11-18 MED ORDER — PANTOPRAZOLE SODIUM 40 MG PO TBEC
40.0000 mg | DELAYED_RELEASE_TABLET | Freq: Two times a day (BID) | ORAL | Status: DC
  Administered 2021-11-18 – 2021-11-19 (×3): 40 mg via ORAL
  Filled 2021-11-18 (×3): qty 1

## 2021-11-18 MED ORDER — MENTHOL 3 MG MT LOZG: 1.0000 | LOZENGE | OROMUCOSAL | Status: DC | PRN

## 2021-11-18 MED ORDER — HYDROCOD POLI-CHLORPHE POLI ER 10-8 MG/5ML PO SUER
5.0000 mL | Freq: Two times a day (BID) | ORAL | Status: DC | PRN
  Administered 2021-11-18 – 2021-11-19 (×3): 5 mL via ORAL
  Filled 2021-11-18 (×3): qty 5

## 2021-11-18 MED ORDER — METHYLPREDNISOLONE SODIUM SUCC 125 MG IJ SOLR
80.0000 mg | Freq: Every day | INTRAMUSCULAR | Status: DC
  Administered 2021-11-18 – 2021-11-19 (×2): 80 mg via INTRAVENOUS
  Filled 2021-11-18: qty 2

## 2021-11-18 MED ORDER — FLUTICASONE PROPIONATE 50 MCG/ACT NA SUSP
2.0000 | Freq: Every day | NASAL | Status: DC
  Administered 2021-11-19: 2 via NASAL
  Filled 2021-11-18: qty 16

## 2021-11-18 MED ORDER — AZITHROMYCIN 500 MG PO TABS
500.0000 mg | ORAL_TABLET | Freq: Every day | ORAL | Status: DC
  Administered 2021-11-18 – 2021-11-19 (×2): 500 mg via ORAL
  Filled 2021-11-18: qty 2
  Filled 2021-11-18: qty 1

## 2021-11-18 MED ORDER — PERFLUTREN LIPID MICROSPHERE
1.0000 mL | INTRAVENOUS | Status: AC | PRN
  Administered 2021-11-18: 3 mL via INTRAVENOUS

## 2021-11-18 MED ORDER — IPRATROPIUM-ALBUTEROL 0.5-2.5 (3) MG/3ML IN SOLN
3.0000 mL | Freq: Three times a day (TID) | RESPIRATORY_TRACT | Status: DC
  Administered 2021-11-18 – 2021-11-19 (×2): 3 mL via RESPIRATORY_TRACT
  Filled 2021-11-18 (×2): qty 3

## 2021-11-18 MED ORDER — BUDESONIDE 0.25 MG/2ML IN SUSP
0.2500 mg | Freq: Two times a day (BID) | RESPIRATORY_TRACT | Status: DC
  Administered 2021-11-18 – 2021-11-19 (×3): 0.25 mg via RESPIRATORY_TRACT
  Filled 2021-11-18 (×3): qty 2

## 2021-11-18 MED ORDER — LORATADINE 10 MG PO TABS
10.0000 mg | ORAL_TABLET | Freq: Every day | ORAL | Status: DC
  Administered 2021-11-18 – 2021-11-19 (×2): 10 mg via ORAL
  Filled 2021-11-18 (×2): qty 1

## 2021-11-18 MED ORDER — BENZONATATE 100 MG PO CAPS
200.0000 mg | ORAL_CAPSULE | Freq: Three times a day (TID) | ORAL | Status: DC | PRN
  Administered 2021-11-18: 200 mg via ORAL
  Filled 2021-11-18: qty 2

## 2021-11-18 MED ORDER — METFORMIN HCL 500 MG PO TABS
500.0000 mg | ORAL_TABLET | Freq: Two times a day (BID) | ORAL | Status: DC
  Administered 2021-11-18 – 2021-11-19 (×2): 500 mg via ORAL
  Filled 2021-11-18 (×2): qty 1

## 2021-11-18 MED ORDER — OXYMETAZOLINE HCL 0.05 % NA SOLN: 1.0000 | Freq: Two times a day (BID) | NASAL | Status: DC

## 2021-11-18 NOTE — Progress Notes (Signed)
  Echocardiogram 2D Echocardiogram has been performed.  Lana Fish 11/18/2021, 11:08 AM

## 2021-11-18 NOTE — ED Notes (Signed)
Pt desatting to 89-90% while laying in bed during echo. Place pt on 2L [place]

## 2021-11-18 NOTE — Progress Notes (Signed)
PROGRESS NOTE        PATIENT DETAILS Name: Glenn [name] Age: 61 y.o. Sex: male Date of Birth: Feb 12, 1960 Admit Date: 11/17/2021 Admitting Physician Jonah Blue, MD [id number], Dorma Russell, MD  Brief Summary: Patient is a 61 y.o.  male with history of COPD, sarcoidosis, chronic pain syndrome on narcotics, DM, HTN, prior CVA-presented with cough x1 month-and worsening shortness of breath mostly on exertion.  He was thought to have COPD exacerbation and subsequently admitted to the hospitalist service.  Significant events: 10/11>> admit to [facility]-COPD exacerbation  Significant studies: 10/11>> CXR: No PNA  Significant microbiology data: 10/11>> COVID PCR: Negative  Procedures: None  Consults: None  Subjective: Claims that his main issue is persistent cough for almost a month.  Shortness of breath is mostly with exertion or when he starts having these coughing spells.  Objective: Vitals: Blood pressure (!) 143/100, pulse (!) 104, temperature 97.8 F (36.6 C), temperature source Oral, resp. rate (!) 25, height 5\' 5"  (1.651 m), weight 68 kg, SpO2 90 %.   Exam: Gen Exam:Alert awake-not in any distress HEENT:atraumatic, normocephalic Chest: Some scattered wheezing. CVS:S1S2 regular Abdomen:soft non tender, non distended Extremities:no edema Neurology: Non focal Skin: no rash  Pertinent Labs/Radiology:    Latest Ref Rng & Units 11/18/2021    3:07 AM 11/17/2021    4:53 PM 11/17/2021   10:36 AM  CBC  WBC 4.0 - 10.5 K/uL 6.7   6.6   Hemoglobin 13.0 - 17.0 g/dL 01/17/2022  16.9  67.8   Hematocrit 39.0 - 52.0 % 44.2  47.0  42.4   Platelets 150 - 400 K/uL 265   249     Lab Results  Component Value Date   NA 137 11/18/2021   K 4.2 11/18/2021   CL 99 11/18/2021   CO2 26 11/18/2021      Assessment/Plan: COPD exacerbation Persistent cough Feels slightly better-continues to have coughing spells that are ongoing x1 month Some minimally wheezing but  moving air well. Continue IV steroids and scheduled bronchodilators Given persistent cough-and some upper airway syndrome-trial of Claritin/Afrin/Flonase Given persistent cough-change PPI to twice daily Zithromax x3 days in case he has an atypical infection that is causing this cough. Reassess 10/13 Continue antitussives-at his request-have added as needed Tussionex. Since also complains of exertional dyspnea-check echo although he has no features consistent with CHF at this point.  HTN BP stable Continue ARB/HCTZ  DM-2 (A1c 7.6 on 3/14) Patient apparently has significant allergy with NovoLog, since not planning on any procedures etc-should be okay to resume metformin Follow  History of CVA No focal deficits Continue Plavix/statin  BPH Continue Flomax  GERD Change to twice daily dosing to see if this helps his persistent cough.  Chronic pain syndrome Continue narcotics Continue Neurontin  BMI: Estimated body mass index is 24.96 kg/m as calculated from the following:   Height as of this encounter: 5\' 5"  (1.651 m).   Weight as of this encounter: 68 kg.   Code status:   Code Status: Full Code   DVT Prophylaxis: enoxaparin (LOVENOX) injection 40 mg Start: 11/17/21 2000    Family Communication: None at bedside   Disposition Plan: Status is: Inpatient Remains inpatient appropriate because: COPD exacerbation-although improved-not yet at baseline.   Planned Discharge Destination:Home hopefully in the next 1-2 days if clinical symptoms improved.   Diet: Diet Order  Diet heart healthy/carb modified Room service appropriate? Yes; Fluid consistency: Thin  Diet effective now                     Antimicrobial agents: Anti-infectives (From admission, onward)    None        MEDICATIONS: Scheduled Meds:  amLODipine  10 mg Oral Daily   atorvastatin  40 mg Oral Daily   budesonide (PULMICORT) nebulizer solution  0.25 mg Nebulization BID    clopidogrel  75 mg Oral Daily   docusate sodium  100 mg Oral BID   enoxaparin (LOVENOX) injection  40 mg Subcutaneous Q24H   fluticasone  2 spray Each Nare Daily   gabapentin  600 mg Oral Daily   losartan  100 mg Oral Daily   And   hydrochlorothiazide  12.5 mg Oral Daily   ipratropium-albuterol  3 mL Nebulization Q6H   loratadine  10 mg Oral Daily   methylPREDNISolone (SOLU-MEDROL) injection  80 mg Intravenous Daily   montelukast  10 mg Oral QHS   oxyCODONE  10 mg Oral Q12H   oxymetazoline  1 spray Each Nare BID   pantoprazole  40 mg Oral BID AC   sodium chloride flush  3 mL Intravenous Q12H   tamsulosin  0.4 mg Oral Daily   Continuous Infusions: PRN Meds:.acetaminophen **OR** acetaminophen, albuterol, benzonatate, bisacodyl, chlorpheniramine-HYDROcodone, guaiFENesin, hydrALAZINE, lubiprostone, menthol-cetylpyridinium, ondansetron **OR** ondansetron (ZOFRAN) IV, oxyCODONE, polyethylene glycol   I have personally reviewed following labs and imaging studies  LABORATORY DATA: CBC: Recent Labs  Lab 11/17/21 1036 11/17/21 1653 11/18/21 0307  WBC 6.6  --  6.7  NEUTROABS 2.3  --   --   HGB 14.0 16.0 14.7  HCT 42.4 47.0 44.2  MCV 88.9  --  87.4  PLT 249  --  161    Basic Metabolic Panel: Recent Labs  Lab 11/17/21 1036 11/17/21 1653 11/18/21 0307  NA 139 137 137  K 3.7 3.7 4.2  CL 103  --  99  CO2 29  --  26  GLUCOSE 88  --  216*  BUN 6*  --  12  CREATININE 0.79  --  1.03  CALCIUM 9.3  --  9.8    GFR: Estimated Creatinine Clearance: 64.7 mL/min (by C-G formula based on SCr of 1.03 mg/dL).  Liver Function Tests: No results for input(s): "AST", "ALT", "ALKPHOS", "BILITOT", "PROT", "ALBUMIN" in the last 168 hours. No results for input(s): "LIPASE", "AMYLASE" in the last 168 hours. No results for input(s): "AMMONIA" in the last 168 hours.  Coagulation Profile: No results for input(s): "INR", "PROTIME" in the last 168 hours.  Cardiac Enzymes: No results for  input(s): "CKTOTAL", "CKMB", "CKMBINDEX", "TROPONINI" in the last 168 hours.  BNP (last 3 results) No results for input(s): "PROBNP" in the last 8760 hours.  Lipid Profile: No results for input(s): "CHOL", "HDL", "LDLCALC", "TRIG", "CHOLHDL", "LDLDIRECT" in the last 72 hours.  Thyroid Function Tests: No results for input(s): "TSH", "T4TOTAL", "FREET4", "T3FREE", "THYROIDAB" in the last 72 hours.  Anemia Panel: No results for input(s): "VITAMINB12", "FOLATE", "FERRITIN", "TIBC", "IRON", "RETICCTPCT" in the last 72 hours.  Urine analysis: No results found for: "COLORURINE", "APPEARANCEUR", "LABSPEC", "PHURINE", "GLUCOSEU", "HGBUR", "BILIRUBINUR", "KETONESUR", "PROTEINUR", "UROBILINOGEN", "NITRITE", "LEUKOCYTESUR"  Sepsis Labs: Lactic Acid, Venous No results found for: "LATICACIDVEN"  MICROBIOLOGY: Recent Results (from the past 240 hour(s))  SARS Coronavirus 2 by RT PCR (hospital order, performed in [facility] hospital lab) *cepheid single result test* Anterior Nasal Swab  Status: None   Collection Time: 11/17/21  5:26 PM   Specimen: Anterior Nasal Swab  Result Value Ref Range Status   SARS Coronavirus 2 by RT PCR NEGATIVE NEGATIVE Final    Comment: (NOTE) SARS-CoV-2 target nucleic acids are NOT DETECTED.  The SARS-CoV-2 RNA is generally detectable in upper and lower respiratory specimens during the acute phase of infection. The lowest concentration of SARS-CoV-2 viral copies this assay can detect is 250 copies / mL. A negative result does not preclude SARS-CoV-2 infection and should not be used as the sole basis for treatment or other patient management decisions.  A negative result may occur with improper specimen collection / handling, submission of specimen other than nasopharyngeal swab, presence of viral mutation(s) within the areas targeted by this assay, and inadequate number of viral copies (<250 copies / mL). A negative result must be combined with  clinical observations, patient history, and epidemiological information.  Fact Sheet for Patients:   RoadLapTop.co.za  Fact Sheet for Healthcare Providers: http://kim-miller.com/  This test is not yet approved or  cleared by the [place] FDA and has been authorized for detection and/or diagnosis of SARS-CoV-2 by FDA under an Emergency Use Authorization (EUA).  This EUA will remain in effect (meaning this test can be used) for the duration of the COVID-19 declaration under Section 564(b)(1) of the Act, 21 U.S.C. section 360bbb-3(b)(1), unless the authorization is terminated or revoked sooner.  Performed at [facility], [address], [place], [place] [address]     RADIOLOGY STUDIES/RESULTS: DG Chest 2 View  Result Date: 11/17/2021 CLINICAL DATA:  Shortness of breath.  Cough and congestion.  COPD. EXAM: CHEST - 2 VIEW COMPARISON:  Chest radiographs 09/30/2021 and CT 10/16/2021 FINDINGS: The cardiomediastinal silhouette is unchanged with normal heart size and chronic prominence of the central pulmonary arteries. Postsurgical changes are again noted in the left lung with associated volume loss and left hemidiaphragm elevation. Scarring is present in the mid and lower lungs bilaterally, and the prior CT demonstrated sequelae of pulmonary sarcoidosis including bronchiectasis. No acute airspace consolidation, overt pulmonary edema, pleural effusion, or pneumothorax is identified. No acute osseous abnormality is seen. IMPRESSION: Chronic lung changes without evidence of acute cardiopulmonary process. Electronically Signed   By: Sebastian Ache M.D.   On: 11/17/2021 11:39     LOS: 1 day   Jeoffrey Massed, MD  [facility]    To contact the attending provider between 7A-7P or the covering provider during after hours 7P-7A, please log into the web site [email or web address] and access using universal [place] password for that web site.  If you do not have the password, please call the hospital operator.  11/18/2021, 9:17 AM

## 2021-11-18 NOTE — Plan of Care (Signed)
  Problem: Education: Goal: Knowledge of disease or condition will improve Outcome: Progressing Goal: Knowledge of the prescribed therapeutic regimen will improve Outcome: Progressing   Problem: Activity: Goal: Ability to tolerate increased activity will improve Outcome: Progressing   Problem: Respiratory: Goal: Ability to maintain a clear airway will improve Outcome: Progressing Goal: Levels of oxygenation will improve Outcome: Progressing Goal: Ability to maintain adequate ventilation will improve Outcome: Progressing   

## 2021-11-19 ENCOUNTER — Other Ambulatory Visit (HOSPITAL_COMMUNITY): Payer: Self-pay

## 2021-11-19 DIAGNOSIS — J9601 Acute respiratory failure with hypoxia: Secondary | ICD-10-CM

## 2021-11-19 DIAGNOSIS — E785 Hyperlipidemia, unspecified: Secondary | ICD-10-CM

## 2021-11-19 MED ORDER — AZITHROMYCIN 500 MG PO TABS
500.0000 mg | ORAL_TABLET | Freq: Every day | ORAL | 0 refills | Status: DC
  Filled 2021-11-19: qty 2, 2d supply, fill #0

## 2021-11-19 MED ORDER — ALBUTEROL SULFATE (2.5 MG/3ML) 0.083% IN NEBU
2.5000 mg | INHALATION_SOLUTION | RESPIRATORY_TRACT | 11 refills | Status: DC | PRN
  Filled 2021-11-19: qty 180, 20d supply, fill #0
  Filled 2021-12-31: qty 150, 12d supply, fill #1

## 2021-11-19 MED ORDER — OXYMETAZOLINE HCL 0.05 % NA SOLN
1.0000 | Freq: Two times a day (BID) | NASAL | 0 refills | Status: AC
  Filled 2021-11-19: qty 30, 30d supply, fill #0

## 2021-11-19 MED ORDER — LORATADINE 10 MG PO TABS
10.0000 mg | ORAL_TABLET | Freq: Every day | ORAL | 0 refills | Status: DC
  Filled 2021-11-19: qty 30, 30d supply, fill #0

## 2021-11-19 MED ORDER — PREDNISONE 10 MG PO TABS
ORAL_TABLET | ORAL | 0 refills | Status: DC
  Filled 2021-11-19: qty 10, 4d supply, fill #0

## 2021-11-19 MED ORDER — BENZONATATE 200 MG PO CAPS
200.0000 mg | ORAL_CAPSULE | Freq: Three times a day (TID) | ORAL | 0 refills | Status: DC | PRN
  Filled 2021-11-19: qty 30, 10d supply, fill #0

## 2021-11-19 MED ORDER — FLUTICASONE PROPIONATE 50 MCG/ACT NA SUSP
2.0000 | Freq: Every day | NASAL | 2 refills | Status: DC
  Filled 2021-11-19: qty 16, 30d supply, fill #0
  Filled 2021-12-31: qty 16, 30d supply, fill #1

## 2021-11-19 MED ORDER — HYDROCOD POLI-CHLORPHE POLI ER 10-8 MG/5ML PO SUER
5.0000 mL | Freq: Two times a day (BID) | ORAL | 0 refills | Status: DC | PRN
  Filled 2021-11-19: qty 70, 7d supply, fill #0

## 2021-11-19 NOTE — Progress Notes (Signed)
Pt is requesting to possibly be d/c with pulmicort for his home nebulizer machine

## 2021-11-19 NOTE — Discharge Summary (Signed)
PATIENT DETAILS Name: Glenn [name] Age: 61 y.o. Sex: male Date of Birth: 1960-02-09 MRN: [id number]. Admitting Physician: Karmen Bongo, MD [id number], Christean Grief, MD  Admit Date: 11/17/2021 Discharge date: 11/19/2021  Recommendations for Outpatient Follow-up:  Follow up with PCP in 1-2 weeks Please obtain CMP/CBC in one week  Admitted From:  Home  Disposition: Home   Discharge Condition: fair  CODE STATUS:   Code Status: Full Code   Diet recommendation:  Diet Order             Diet - low sodium heart healthy           Diet Carb Modified           Diet heart healthy/carb modified Room service appropriate? Yes; Fluid consistency: Thin  Diet effective now                    Brief Summary: Patient is a 61 y.o.  male with history of COPD, sarcoidosis, chronic pain syndrome on narcotics, DM, HTN, prior CVA-presented with cough x1 month-and worsening shortness of breath mostly on exertion.  He was thought to have COPD exacerbation and subsequently admitted to the hospitalist service.   Significant events: 10/11>> admit to [facility]-COPD exacerbation   Significant studies: 10/11>> CXR: No PNA 10/12>> Echo: EF 59-56%, grade 1 diastolic dysfunction    Significant microbiology data: 10/11>> COVID PCR: Negative   Procedures: None   Consults: None  Brief Hospital Course: COPD exacerbation Persistent cough x1 month Better-moving air well-hardly any rhonchi this morning. Treated with a combination of IV steroids/bronchodilators/empiric Zithromax/antitussives. Continues to cough (seems to be the main problem) but somewhat better this morning-asking for cough syrup with narcotic on discharge. Continue Afrin for another day or so, continue Claritin/Flonase (nasal congestion/sinus congestion better) Continue PPI twice daily for now Echo stable-no evidence of CHF by echo or clinically. Has follow-up with his primary pulmonologist-early next week-where further  readjustment/reevaluation can be done.   HTN BP stable Continue ARB/HCTZ   DM-2 (A1c 7.6 on 3/14) Continue metformin   History of CVA No focal deficits Continue Plavix/statin   BPH Continue Flomax   GERD Change to twice daily dosing to see if this helps his persistent cough.   Chronic pain syndrome Continue narcotics Continue Neurontin   BMI: Estimated body mass index is 24.96 kg/m as calculated from the following:   Height as of this encounter: _0  (1.651 m).   Weight as of this encounter: 68 kg.   Discharge Diagnoses:  Principal Problem:   COPD with acute exacerbation ([place]) Active Problems:   Hypertension   Chronic pain   Diabetes mellitus type 2 in nonobese (HCC)   Hyperlipidemia   History of CVA (cerebrovascular accident)   Discharge Instructions:  Activity:  As tolerated   Discharge Instructions     Call MD for:  difficulty breathing, headache or visual disturbances   Complete by: As directed    Diet - low sodium heart healthy   Complete by: As directed    Diet Carb Modified   Complete by: As directed    Discharge instructions   Complete by: As directed    Follow with Primary MD  Nolene Ebbs, MD in 1-2 weeks  Please get a complete blood count and chemistry panel checked by your Primary MD at your next visit, and again as instructed by your Primary MD.  Get Medicines reviewed and adjusted: Please take all your medications with you for your next visit with your  Primary MD  Laboratory/radiological data: Please request your Primary MD to go over all hospital tests and procedure/radiological results at the follow up, please ask your Primary MD to get all Hospital records sent to his/her office.  In some cases, they will be blood work, cultures and biopsy results pending at the time of your discharge. Please request that your primary care M.D. follows up on these results.  Also Note the following: If you experience worsening of your admission  symptoms, develop shortness of breath, life threatening emergency, suicidal or homicidal thoughts you must seek medical attention immediately by calling 911 or calling your MD immediately  if symptoms less severe.  You must read complete instructions/literature along with all the possible adverse reactions/side effects for all the Medicines you take and that have been prescribed to you. Take any new Medicines after you have completely understood and accpet all the possible adverse reactions/side effects.   Do not drive when taking Pain medications or sleeping medications (Benzodaizepines)  Do not take more than prescribed Pain, Sleep and Anxiety Medications. It is not advisable to combine anxiety,sleep and pain medications without talking with your primary care practitioner  Special Instructions: If you have smoked or chewed Tobacco  in the last 2 yrs please stop smoking, stop any regular Alcohol  and or any Recreational drug use.  Wear Seat belts while driving.  Please note: You were cared for by a hospitalist during your hospital stay. Once you are discharged, your primary care physician will handle any further medical issues. Please note that NO REFILLS for any discharge medications will be authorized once you are discharged, as it is imperative that you return to your primary care physician (or establish a relationship with a primary care physician if you do not have one) for your post hospital discharge needs so that they can reassess your need for medications and monitor your lab values.   Increase activity slowly   Complete by: As directed       Allergies as of 11/19/2021       Reactions   Insulins Swelling   04/2020. Do not use Novolog insulin. Swelling of tongue   Ibuprofen Other (See Comments)   Abdominal sensitivity   Pork-derived Products Other (See Comments)   Cultural-Muslim   Shrimp [shellfish Allergy] Itching   Tylenol [acetaminophen] Nausea Only   Abdominal sensitvity    Lisinopril Swelling, Rash   Tongue swelling        Medication List     STOP taking these medications    cetirizine 10 MG tablet Commonly known as: ZyrTEC Allergy   hydrOXYzine 50 MG capsule Commonly known as: VISTARIL       TAKE these medications    albuterol 108 (90 Base) MCG/ACT inhaler Commonly known as: ProAir HFA 2 puffs every 4 hours as needed only  if your can't catch your breath What changed:  how much to take how to take this when to take this additional instructions   albuterol (2.5 MG/3ML) 0.083% nebulizer solution Commonly known as: PROVENTIL Take 3 mLs (2.5 mg total) by nebulization every 4 (four) hours as needed for wheezing or shortness of breath. What changed:  when to take this reasons to take this additional instructions   Amitiza 24 MCG capsule Generic drug: lubiprostone Take 24 mcg by mouth daily as needed for constipation.   amLODipine 10 MG tablet Commonly known as: NORVASC Take 1 tablet (10 mg total) by mouth daily.   atorvastatin 40 MG tablet Commonly known  as: LIPITOR Take 1 tablet (40 mg total) by mouth daily.   azithromycin 500 MG tablet Commonly known as: ZITHROMAX Take 1 tablet (500 mg total) by mouth daily.   benzonatate 200 MG capsule Commonly known as: TESSALON Take 1 capsule (200 mg total) by mouth 3 (three) times daily as needed for cough.   budesonide-formoterol 160-4.5 MCG/ACT inhaler Commonly known as: Symbicort Inhale 2 puffs into the lungs 2 (two) times daily.   chlorpheniramine-HYDROcodone 10-8 MG/5ML Commonly known as: TUSSIONEX Take 5 mLs by mouth every 12 (twelve) hours as needed (intractable cough).   clopidogrel 75 MG tablet Commonly known as: PLAVIX Take 75 mg by mouth daily.   fluticasone 50 MCG/ACT nasal spray Commonly known as: FLONASE Place 2 sprays into both nostrils daily.   gabapentin 300 MG capsule Commonly known as: Neurontin Take 1 capsule (300 mg total) by mouth 3 (three) times  daily. What changed:  how much to take when to take this   loratadine 10 MG tablet Commonly known as: CLARITIN Take 1 tablet (10 mg total) by mouth daily.   losartan-hydrochlorothiazide 100-12.5 MG tablet Commonly known as: HYZAAR Take 1 tablet by mouth daily.   metFORMIN 500 MG tablet Commonly known as: GLUCOPHAGE Take 1 tablet (500 mg total) by mouth 2 (two) times daily with a meal.   montelukast 10 MG tablet Commonly known as: SINGULAIR TAKE 1 TABLET BY MOUTH EVERYDAY AT BEDTIME What changed: See the new instructions.   naloxone 4 MG/0.1ML Liqd nasal spray kit Commonly known as: NARCAN SMARTSIG:Both Nares   nitroGLYCERIN 0.4 MG SL tablet Commonly known as: NITROSTAT Place 0.4 mg under the tongue every 5 (five) minutes as needed for chest pain.   Oxycodone HCl 20 MG Tabs Take 20 mg by mouth 4 (four) times daily.   oxymetazoline 0.05 % nasal spray Commonly known as: AFRIN Place 1 spray into both nostrils 2 (two) times daily for 2 days.   pantoprazole 40 MG tablet Commonly known as: PROTONIX Take 1 tablet (40 mg total) by mouth daily.   predniSONE 10 MG tablet Commonly known as: DELTASONE Take 40 mg daily for 1 day, 30 mg daily for 1 day, 20 mg daily for 1 days,10 mg daily for 1 day, then stop   sildenafil 100 MG tablet Commonly known as: VIAGRA Take 1 tablet (100 mg total) by mouth daily as needed. What changed: reasons to take this   tamsulosin 0.4 MG Caps capsule Commonly known as: FLOMAX Take 0.4 mg by mouth daily.   VITAMIN C PO Take 2 tablets by mouth daily.   Vitamin D (Ergocalciferol) 1.25 MG (50000 UNIT) Caps capsule Commonly known as: DRISDOL Take 50,000 Units by mouth once a week. Mondays   Xtampza ER 9 MG C12a Generic drug: oxyCODONE ER Take 9 mg by mouth 2 (two) times daily.        Follow-up Information     Nolene Ebbs, MD. Schedule an appointment as soon as possible for a visit in 1 week(s).   Specialty: Internal  Medicine Contact information: [address] [place] [address] [phone]         Tanda Rockers, MD Follow up.   Specialty: Pulmonary Disease Why: keep your existing appointment next week Contact information: [address] [place] [address] [phone]                Allergies  Allergen Reactions   Insulins Swelling    04/2020. Do not use Novolog insulin. Swelling  of tongue   Ibuprofen Other (See Comments)    Abdominal sensitivity   Pork-Derived Products Other (See Comments)    Cultural-Muslim   Shrimp [Shellfish Allergy] Itching   Tylenol [Acetaminophen] Nausea Only    Abdominal sensitvity   Lisinopril Swelling and Rash    Tongue swelling     Other Procedures/Studies: ECHOCARDIOGRAM COMPLETE  Result Date: 11/18/2021    ECHOCARDIOGRAM REPORT   Patient Name:   Glenn [name] Date of Exam: 11/18/2021 Medical Rec #:  [id number]     Height:       65.0 in Accession #:    [id number]    Weight:       150.0 lb Date of Birth:  1960/05/07     BSA:          1.750 m Patient Age:    65 years      BP:           146/103 mmHg Patient Gender: M             HR:           110 bpm. Exam Location:  Inpatient Procedure: 2D Echo and Intracardiac Opacification Agent Indications:     acute respiratory distress  History:         Patient has prior history of Echocardiogram examinations, most                  recent 06/24/2020. COPD and Stroke; Risk Factors:Diabetes,                  Hypertension and Dyslipidemia.  Sonographer:     Harvie Junior Referring Phys:  [place] Diagnosing Phys: Skeet Latch MD  Sonographer Comments: Technically difficult study due to poor echo windows and no subcostal window. Image acquisition challenging due to respiratory motion. IMPRESSIONS  1. Left ventricular ejection fraction, by estimation, is 55 to 60%. The left ventricle has normal function. The left ventricle has no regional wall motion abnormalities. Left ventricular  diastolic parameters are consistent with Grade I diastolic dysfunction (impaired relaxation).  2. Right ventricular systolic function is normal. The right ventricular size is normal.  3. The mitral valve is normal in structure. No evidence of mitral valve regurgitation. No evidence of mitral stenosis.  4. The aortic valve is tricuspid. Aortic valve regurgitation is not visualized. No aortic stenosis is present.  5. The inferior vena cava is normal in size with greater than 50% respiratory variability, suggesting right atrial pressure of 3 mmHg. FINDINGS  Left Ventricle: Left ventricular ejection fraction, by estimation, is 55 to 60%. The left ventricle has normal function. The left ventricle has no regional wall motion abnormalities. Definity contrast agent was given IV to delineate the left ventricular  endocardial borders. The left ventricular internal cavity size was normal in size. There is no left ventricular hypertrophy. Left ventricular diastolic parameters are consistent with Grade I diastolic dysfunction (impaired relaxation). Normal left ventricular filling pressure. Right Ventricle: The right ventricular size is normal. No increase in right ventricular wall thickness. Right ventricular systolic function is normal. Left Atrium: Left atrial size was normal in size. Right Atrium: Right atrial size was normal in size. Pericardium: There is no evidence of pericardial effusion. Mitral Valve: The mitral valve is normal in structure. No evidence of mitral valve regurgitation. No evidence of mitral valve stenosis. Tricuspid Valve: The tricuspid valve is normal in structure. Tricuspid valve regurgitation is not demonstrated. No evidence of tricuspid stenosis. Aortic Valve: The  aortic valve is tricuspid. Aortic valve regurgitation is not visualized. No aortic stenosis is present. Aortic valve mean gradient measures 3.0 mmHg. Aortic valve peak gradient measures 5.9 mmHg. Aortic valve area, by VTI measures 2.35 cm.  Pulmonic Valve: The pulmonic valve was normal in structure. Pulmonic valve regurgitation is not visualized. No evidence of pulmonic stenosis. Aorta: The aortic root is normal in size and structure. Venous: The inferior vena cava is normal in size with greater than 50% respiratory variability, suggesting right atrial pressure of 3 mmHg. IAS/Shunts: No atrial level shunt detected by color flow Doppler.  LEFT VENTRICLE PLAX 2D LVIDd:         4.00 cm     Diastology LVIDs:         3.20 cm     LV e' medial:    6.31 cm/s LV PW:         1.00 cm     LV E/e' medial:  9.2 LV IVS:        1.00 cm     LV e' lateral:   9.03 cm/s LVOT diam:     2.00 cm     LV E/e' lateral: 6.5 LV SV:         48 LV SV Index:   28 LVOT Area:     3.14 cm  LV Volumes (MOD) LV vol d, MOD A2C: 62.3 ml LV vol d, MOD A4C: 91.2 ml LV vol s, MOD A2C: 35.2 ml LV vol s, MOD A4C: 49.3 ml LV SV MOD A2C:     27.1 ml LV SV MOD A4C:     91.2 ml LV SV MOD BP:      33.5 ml RIGHT VENTRICLE RV Basal diam:  3.40 cm RV Mid diam:    2.50 cm RV S prime:     15.40 cm/s TAPSE (M-mode): 2.2 cm LEFT ATRIUM             Index        RIGHT ATRIUM           Index LA diam:        3.00 cm 1.71 cm/m   RA Area:     11.30 cm LA Vol (A2C):   32.1 ml 18.34 ml/m  RA Volume:   24.60 ml  14.05 ml/m LA Vol (A4C):   29.2 ml 16.68 ml/m LA Biplane Vol: 32.3 ml 18.45 ml/m  AORTIC VALVE                    PULMONIC VALVE AV Area (Vmax):    2.21 cm     PV Vmax:       0.75 m/s AV Area (Vmean):   2.07 cm     PV Peak grad:  2.2 mmHg AV Area (VTI):     2.35 cm AV Vmax:           121.00 cm/s AV Vmean:          89.100 cm/s AV VTI:            0.206 m AV Peak Grad:      5.9 mmHg AV Mean Grad:      3.0 mmHg LVOT Vmax:         85.00 cm/s LVOT Vmean:        58.600 cm/s LVOT VTI:          0.154 m LVOT/AV VTI ratio: 0.75  AORTA Ao Root diam: 3.40 cm Ao Asc diam:  3.10 cm  MITRAL VALVE MV Area (PHT): 6.22 cm    SHUNTS MV Decel Time: 122 msec    Systemic VTI:  0.15 m MV E velocity: 58.25 cm/s  Systemic  Diam: 2.00 cm MV A velocity: 93.80 cm/s MV E/A ratio:  0.62 Skeet Latch MD Electronically signed by Skeet Latch MD Signature Date/Time: 11/18/2021/2:33:18 PM    Final (Updated)    DG Chest 2 View  Result Date: 11/17/2021 CLINICAL DATA:  Shortness of breath.  Cough and congestion.  COPD. EXAM: CHEST - 2 VIEW COMPARISON:  Chest radiographs 09/30/2021 and CT 10/16/2021 FINDINGS: The cardiomediastinal silhouette is unchanged with normal heart size and chronic prominence of the central pulmonary arteries. Postsurgical changes are again noted in the left lung with associated volume loss and left hemidiaphragm elevation. Scarring is present in the mid and lower lungs bilaterally, and the prior CT demonstrated sequelae of pulmonary sarcoidosis including bronchiectasis. No acute airspace consolidation, overt pulmonary edema, pleural effusion, or pneumothorax is identified. No acute osseous abnormality is seen. IMPRESSION: Chronic lung changes without evidence of acute cardiopulmonary process. Electronically Signed   By: Logan Bores M.D.   On: 11/17/2021 11:39     TODAY-DAY OF DISCHARGE:  Subjective:   Glenn [name] today has no headache,no chest abdominal pain,no new weakness tingling or numbness, feels much better wants to go home today.   Objective:   Blood pressure 115/79, pulse 91, temperature 97.6 F (36.4 C), temperature source Oral, resp. rate 20, height _0  (1.651 m), weight 68 kg, SpO2 97 %.  Intake/Output Summary (Last 24 hours) at 11/19/2021 0842 Last data filed at 11/19/2021 0158 Gross per 24 hour  Intake --  Output 600 ml  Net -600 ml   Filed Weights   11/17/21 1036  Weight: 68 kg    Exam: Awake Alert, Oriented *3, No new F.N deficits, Normal affect [place].AT,PERRAL Supple Neck,No JVD, No cervical lymphadenopathy appriciated.  Symmetrical Chest wall movement, Good air movement bilaterally, CTAB RRR,No Gallops,Rubs or new Murmurs, No Parasternal Heave +ve B.Sounds, Abd  Soft, Non tender, No organomegaly appriciated, No rebound -guarding or rigidity. No Cyanosis, Clubbing or edema, No new Rash or bruise   PERTINENT RADIOLOGIC STUDIES: ECHOCARDIOGRAM COMPLETE  Result Date: 11/18/2021    ECHOCARDIOGRAM REPORT   Patient Name:   Glenn [name] Date of Exam: 11/18/2021 Medical Rec #:  [id number]     Height:       65.0 in Accession #:    [id number]    Weight:       150.0 lb Date of Birth:  06/14/1960     BSA:          1.750 m Patient Age:    22 years      BP:           146/103 mmHg Patient Gender: M             HR:           110 bpm. Exam Location:  Inpatient Procedure: 2D Echo and Intracardiac Opacification Agent Indications:     acute respiratory distress  History:         Patient has prior history of Echocardiogram examinations, most                  recent 06/24/2020. COPD and Stroke; Risk Factors:Diabetes,                  Hypertension and Dyslipidemia.  Sonographer:     Harvie Junior Referring Phys:  [place] Phys: Skeet Latch MD  Sonographer Comments: Technically difficult study due to poor echo windows and no subcostal window. Image acquisition challenging due to respiratory motion. IMPRESSIONS  1. Left ventricular ejection fraction, by estimation, is 55 to 60%. The left ventricle has normal function. The left ventricle has no regional wall motion abnormalities. Left ventricular diastolic parameters are consistent with Grade I diastolic dysfunction (impaired relaxation).  2. Right ventricular systolic function is normal. The right ventricular size is normal.  3. The mitral valve is normal in structure. No evidence of mitral valve regurgitation. No evidence of mitral stenosis.  4. The aortic valve is tricuspid. Aortic valve regurgitation is not visualized. No aortic stenosis is present.  5. The inferior vena cava is normal in size with greater than 50% respiratory variability, suggesting right atrial pressure of 3 mmHg. FINDINGS  Left Ventricle:  Left ventricular ejection fraction, by estimation, is 55 to 60%. The left ventricle has normal function. The left ventricle has no regional wall motion abnormalities. Definity contrast agent was given IV to delineate the left ventricular  endocardial borders. The left ventricular internal cavity size was normal in size. There is no left ventricular hypertrophy. Left ventricular diastolic parameters are consistent with Grade I diastolic dysfunction (impaired relaxation). Normal left ventricular filling pressure. Right Ventricle: The right ventricular size is normal. No increase in right ventricular wall thickness. Right ventricular systolic function is normal. Left Atrium: Left atrial size was normal in size. Right Atrium: Right atrial size was normal in size. Pericardium: There is no evidence of pericardial effusion. Mitral Valve: The mitral valve is normal in structure. No evidence of mitral valve regurgitation. No evidence of mitral valve stenosis. Tricuspid Valve: The tricuspid valve is normal in structure. Tricuspid valve regurgitation is not demonstrated. No evidence of tricuspid stenosis. Aortic Valve: The aortic valve is tricuspid. Aortic valve regurgitation is not visualized. No aortic stenosis is present. Aortic valve mean gradient measures 3.0 mmHg. Aortic valve peak gradient measures 5.9 mmHg. Aortic valve area, by VTI measures 2.35 cm. Pulmonic Valve: The pulmonic valve was normal in structure. Pulmonic valve regurgitation is not visualized. No evidence of pulmonic stenosis. Aorta: The aortic root is normal in size and structure. Venous: The inferior vena cava is normal in size with greater than 50% respiratory variability, suggesting right atrial pressure of 3 mmHg. IAS/Shunts: No atrial level shunt detected by color flow Doppler.  LEFT VENTRICLE PLAX 2D LVIDd:         4.00 cm     Diastology LVIDs:         3.20 cm     LV e' medial:    6.31 cm/s LV PW:         1.00 cm     LV E/e' medial:  9.2 LV IVS:         1.00 cm     LV e' lateral:   9.03 cm/s LVOT diam:     2.00 cm     LV E/e' lateral: 6.5 LV SV:         48 LV SV Index:   28 LVOT Area:     3.14 cm  LV Volumes (MOD) LV vol d, MOD A2C: 62.3 ml LV vol d, MOD A4C: 91.2 ml LV vol s, MOD A2C: 35.2 ml LV vol s, MOD A4C: 49.3 ml LV SV MOD A2C:     27.1 ml LV SV MOD A4C:     91.2 ml LV SV MOD BP:  33.5 ml RIGHT VENTRICLE RV Basal diam:  3.40 cm RV Mid diam:    2.50 cm RV S prime:     15.40 cm/s TAPSE (M-mode): 2.2 cm LEFT ATRIUM             Index        RIGHT ATRIUM           Index LA diam:        3.00 cm 1.71 cm/m   RA Area:     11.30 cm LA Vol (A2C):   32.1 ml 18.34 ml/m  RA Volume:   24.60 ml  14.05 ml/m LA Vol (A4C):   29.2 ml 16.68 ml/m LA Biplane Vol: 32.3 ml 18.45 ml/m  AORTIC VALVE                    PULMONIC VALVE AV Area (Vmax):    2.21 cm     PV Vmax:       0.75 m/s AV Area (Vmean):   2.07 cm     PV Peak grad:  2.2 mmHg AV Area (VTI):     2.35 cm AV Vmax:           121.00 cm/s AV Vmean:          89.100 cm/s AV VTI:            0.206 m AV Peak Grad:      5.9 mmHg AV Mean Grad:      3.0 mmHg LVOT Vmax:         85.00 cm/s LVOT Vmean:        58.600 cm/s LVOT VTI:          0.154 m LVOT/AV VTI ratio: 0.75  AORTA Ao Root diam: 3.40 cm Ao Asc diam:  3.10 cm MITRAL VALVE MV Area (PHT): 6.22 cm    SHUNTS MV Decel Time: 122 msec    Systemic VTI:  0.15 m MV E velocity: 58.25 cm/s  Systemic Diam: 2.00 cm MV A velocity: 93.80 cm/s MV E/A ratio:  0.62 Skeet Latch MD Electronically signed by Skeet Latch MD Signature Date/Time: 11/18/2021/2:33:18 PM    Final (Updated)    DG Chest 2 View  Result Date: 11/17/2021 CLINICAL DATA:  Shortness of breath.  Cough and congestion.  COPD. EXAM: CHEST - 2 VIEW COMPARISON:  Chest radiographs 09/30/2021 and CT 10/16/2021 FINDINGS: The cardiomediastinal silhouette is unchanged with normal heart size and chronic prominence of the central pulmonary arteries. Postsurgical changes are again noted in the left lung with  associated volume loss and left hemidiaphragm elevation. Scarring is present in the mid and lower lungs bilaterally, and the prior CT demonstrated sequelae of pulmonary sarcoidosis including bronchiectasis. No acute airspace consolidation, overt pulmonary edema, pleural effusion, or pneumothorax is identified. No acute osseous abnormality is seen. IMPRESSION: Chronic lung changes without evidence of acute cardiopulmonary process. Electronically Signed   By: Logan Bores M.D.   On: 11/17/2021 11:39     PERTINENT LAB RESULTS: CBC: Recent Labs    11/17/21 1036 11/17/21 1653 11/18/21 0307  WBC 6.6  --  6.7  HGB 14.0 16.0 14.7  HCT 42.4 47.0 44.2  PLT 249  --  265   CMET CMP     Component Value Date/Time   NA 137 11/18/2021 0307   K 4.2 11/18/2021 0307   CL 99 11/18/2021 0307   CO2 26 11/18/2021 0307   GLUCOSE 216 (H) 11/18/2021 0307   BUN 12 11/18/2021 0307  CREATININE 1.03 11/18/2021 0307   CALCIUM 9.8 11/18/2021 0307   PROT 6.4 (L) 05/24/2021 0559   ALBUMIN 3.5 05/24/2021 0559   AST 16 05/24/2021 0559   ALT 17 05/24/2021 0559   ALKPHOS 53 05/24/2021 0559   BILITOT 0.5 05/24/2021 0559   GFRNONAA >60 11/18/2021 0307   GFRAA >60 07/01/2019 0625    GFR Estimated Creatinine Clearance: 64.7 mL/min (by C-G formula based on SCr of 1.03 mg/dL). No results for input(s): "LIPASE", "AMYLASE" in the last 72 hours. No results for input(s): "CKTOTAL", "CKMB", "CKMBINDEX", "TROPONINI" in the last 72 hours. Invalid input(s): "POCBNP" No results for input(s): "DDIMER" in the last 72 hours. No results for input(s): "HGBA1C" in the last 72 hours. No results for input(s): "CHOL", "HDL", "LDLCALC", "TRIG", "CHOLHDL", "LDLDIRECT" in the last 72 hours. No results for input(s): "TSH", "T4TOTAL", "T3FREE", "THYROIDAB" in the last 72 hours.  Invalid input(s): "FREET3" No results for input(s): "VITAMINB12", "FOLATE", "FERRITIN", "TIBC", "IRON", "RETICCTPCT" in the last 72 hours. Coags: No  results for input(s): "INR" in the last 72 hours.  Invalid input(s): "PT" Microbiology: Recent Results (from the past 240 hour(s))  SARS Coronavirus 2 by RT PCR (hospital order, performed in [facility] hospital lab) *cepheid single result test* Anterior Nasal Swab     Status: None   Collection Time: 11/17/21  5:26 PM   Specimen: Anterior Nasal Swab  Result Value Ref Range Status   SARS Coronavirus 2 by RT PCR NEGATIVE NEGATIVE Final    Comment: (NOTE) SARS-CoV-2 target nucleic acids are NOT DETECTED.  The SARS-CoV-2 RNA is generally detectable in upper and lower respiratory specimens during the acute phase of infection. The lowest concentration of SARS-CoV-2 viral copies this assay can detect is 250 copies / mL. A negative result does not preclude SARS-CoV-2 infection and should not be used as the sole basis for treatment or other patient management decisions.  A negative result may occur with improper specimen collection / handling, submission of specimen other than nasopharyngeal swab, presence of viral mutation(s) within the areas targeted by this assay, and inadequate number of viral copies (<250 copies / mL). A negative result must be combined with clinical observations, patient history, and epidemiological information.  Fact Sheet for Patients:   https://www.patel.info/  Fact Sheet for Healthcare Providers: https://hall.com/  This test is not yet approved or  cleared by the [place] FDA and has been authorized for detection and/or diagnosis of SARS-CoV-2 by FDA under an Emergency Use Authorization (EUA).  This EUA will remain in effect (meaning this test can be used) for the duration of the COVID-19 declaration under Section 564(b)(1) of the Act, 21 U.S.C. section 360bbb-3(b)(1), unless the authorization is terminated or revoked sooner.  Performed at [facility], [place] [address], [place], [place] [address]      FURTHER DISCHARGE INSTRUCTIONS:  Get Medicines reviewed and adjusted: Please take all your medications with you for your next visit with your Primary MD  Laboratory/radiological data: Please request your Primary MD to go over all hospital tests and procedure/radiological results at the follow up, please ask your Primary MD to get all Hospital records sent to his/her office.  In some cases, they will be blood work, cultures and biopsy results pending at the time of your discharge. Please request that your primary care M.D. goes through all the records of your hospital data and follows up on these results.  Also Note the following: If you experience worsening of your admission symptoms, develop shortness  of breath, life threatening emergency, suicidal or homicidal thoughts you must seek medical attention immediately by calling 911 or calling your MD immediately  if symptoms less severe.  You must read complete instructions/literature along with all the possible adverse reactions/side effects for all the Medicines you take and that have been prescribed to you. Take any new Medicines after you have completely understood and accpet all the possible adverse reactions/side effects.   Do not drive when taking Pain medications or sleeping medications (Benzodaizepines)  Do not take more than prescribed Pain, Sleep and Anxiety Medications. It is not advisable to combine anxiety,sleep and pain medications without talking with your primary care practitioner  Special Instructions: If you have smoked or chewed Tobacco  in the last 2 yrs please stop smoking, stop any regular Alcohol  and or any Recreational drug use.  Wear Seat belts while driving.  Please note: You were cared for by a hospitalist during your hospital stay. Once you are discharged, your primary care physician will handle any further medical issues. Please note that NO REFILLS for any discharge medications will be authorized once you  are discharged, as it is imperative that you return to your primary care physician (or establish a relationship with a primary care physician if you do not have one) for your post hospital discharge needs so that they can reassess your need for medications and monitor your lab values.  Total Time spent coordinating discharge including counseling, education and face to face time equals greater than 30 minutes.  SignedOren Binet 11/19/2021 8:42 AM

## 2021-11-21 NOTE — Progress Notes (Unsigned)
Subjective:     Patient ID: Glenn [name], male   DOB: 12-04-1960     MRN: [id number]      Brief patient profile:  68 yobm quit smoking 09/2020   with onset of symptoms in 1995 while in [place] dx as asthma vs sarcoid and rx pred/ theoph / albuterol seen by allergist dx as pollen moved to [place] where underwent bilateral lung surgery 2011 ? Lung vol reduction ? Helped breathing at the time but uses HC parking and  [facility] = can't walk a nl pace on a flat grade s sob but does fine slow and flat so referred to pulmonary clinic 10/05/2016 by Dr  Jackson Latino      History of Present Illness  10/05/2016 1st [facility] office visit/ Glenn [name]   Chief Complaint  Patient presents with   Pulmonary Consult    Referred by Dr. Haskel Schroeder for eval of Asthma and COPD. Pt states he was dxed with COPD and Asthma in 1995. He moved here a year ago from [place], [place]. He states his breathing is "okay" today. He states he has a "slight cold"- prod cough with minimal brown sputum.    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  Work on Orthoptist B = Backup Only use your albuterol (proair) as a rescue medication Plan C = Crisis - only use your albuterol nebulizer if you first try Plan B and it fails to help > ok to use the nebulizer up to every 4 hours but if start needing it regularly call for immediate appointment Please schedule a follow up office visit in 6 weeks, call sooner if needed with pfts    01/15/2020  f/u ov/Glenn [name] re:  Copd II / still  smoking / no really on maint symb/ doesn't remember rx with spiriva previously  Chief Complaint  Patient presents with   Follow-up    Breathing is overall doing well. He states that he uses his albuterol inhaler about every 4 hours and uses neb every night.   Dyspnea:  MMRC2 = can't walk a nl pace on a flat grade s sob but does fine slow and flat  Cough: none  Sleeping: 3 h p lie down needs nebulizer  SABA use: way too much /totally confused on maint vs prns 02: none  Prednisone always helps and when it does he tends to stop everything not understanding the difference between maint and prns Rec Plan A = Automatic = Always=    symbicort 160 Take 2 puffs first thing in am and then another 2 puffs about 12 hours later and spiriva 2 pffs each am  Prednisone 10 mg take  4 each am x 2 days,   2 each am x 2 days,  1 each am x 2 days and stop   Plan B = Backup (to supplement plan A,  not to replace it) Only use your albuterol inhaler as a rescue medication Plan C = Crisis (instead of Plan B but only if Plan B stops working) - only use your albuterol nebulizer if you first try Plan B and it fails to help > ok to use the nebulizer up to every 4 hours but if start needing it regularly call for immediate appointment The key is to stop smoking completely before smoking completely stops you! I very strongly recommend you get the moderna or pfizer vaccine  If you are satisfied with your treatment plan,  let your doctor know and he/she can either refill your medications or you can return here when your prescription runs out but you must bring all active medications with you if you want my help.  NP recs  06/09/20   HST/pfts       08/16/2021  f/u ov/Glenn [name] re: GOLD  2  maint on symb/spiriva  and lots of saba p exertion (not the instruction given)  Chief Complaint  Patient presents with   Follow-up    Pt states his breathing is good but he has a  sore throat.   Dyspnea: MMRC2 = can't walk a nl pace on a  flat grade s sob but does fine slow and flat   Cough: minimal dry  Sleeping: 30 degrees electric bed  SABA use: none / nebulizer  once or twice daily never prechallenges or rechallenges  02: none  Covid status:  never vax  Prednisone always helps breathing transiently/ interested in pulm rehab  Rec  Prednisone 10 mg take  4 each am x 2 days,   2 each am x 2 days,  1 each am x 2 days and stop  Plan A = Automatic = Always=    symbicort 160  Take 2 puffs first thing in am and then another 2 puffs about 12 hours later. Spiriva is 2 pffs after the symbicort Work on inhaler technique:   Plan B = Backup (to supplement plan A, not to replace it) Only use your albuterol inhaler as a rescue medication  Plan C = Crisis (instead of Plan B but only if Plan B stops working) - only use your albuterol nebulizer if you first try [place] to try albuterol 15 min before an activity (on alternating days remembering the nebulizer is 8 x stronger)  that you know would usually make you short of breath  My office will be contacting you by phone for referral to pulmonary rehab > did not happen   Admit 09/29/21 and 11/17/21  and tussionex made the difference   11/22/2021  f/u ov/Glenn [name] re: GOLD 2   maint on symbicort/ spiriva / new globus sensation worse off tussionex  Chief Complaint  Patient presents with   Follow-up    Inpatient at [facility]. Wheezing.  D/C'd 11/19/21  Dyspnea:  MMRC2 = can't walk a nl pace on a flat grade s sob but does fine slow and flat   Cough: sense of st/ globus / using mints and prn pm ppi  Sleeping: sleep 30 degrees SABA use: none on day of ov/ needed neb 11/21/21 p carrying heavy object  02: none  Covid status:   never  Lung cancer screening :  in program     No obvious day to day or daytime variability or assoc excess/ purulent sputum or mucus plugs or hemoptysis or cp or chest tightness, subjective wheeze or overt sinus or hb symptoms.   Sleeping  without nocturnal  or early am  exacerbation  of respiratory  c/o's or need for noct saba. Also denies any obvious fluctuation of symptoms with weather or environmental changes or other aggravating or alleviating factors except as outlined above   No unusual exposure hx or h/o childhood pna/ asthma or knowledge of premature birth.  Current Allergies, Complete Past Medical History, Past Surgical History, Family History, and Social History were reviewed in [employer] record.  ROS  The following are not active complaints unless bolded Hoarseness, sore throat/globus, dysphagia, dental problems, itching, sneezing,  nasal congestion or discharge of excess mucus or purulent secretions, ear ache,   fever, chills, sweats, unintended wt loss or wt gain, classically pleuritic or exertional cp,  orthopnea pnd or arm/hand swelling  or leg swelling, presyncope, palpitations, abdominal pain, anorexia, nausea, vomiting, diarrhea  or change in bowel habits or change in bladder habits, change in stools or change in urine, dysuria, hematuria,  rash, arthralgias, visual complaints, headache, numbness, weakness or ataxia or problems with walking or coordination,  change in mood or  memory.        Current Meds  Medication Sig   albuterol (PROAIR HFA) 108 (90 Base) MCG/ACT inhaler 2 puffs every 4 hours as needed only  if your can't catch your breath (Patient taking differently: Inhale 2 puffs into the lungs 6 (six) times daily.)   albuterol (PROVENTIL) (2.5 MG/3ML) 0.083% nebulizer solution Take 3 mLs (2.5 mg total) by nebulization every 4 (four) hours as needed for wheezing or shortness of breath.   AMITIZA 24 MCG capsule Take 24 mcg by mouth daily as needed for constipation.   amLODipine (NORVASC) 10 MG tablet Take 1 tablet (10 mg total) by mouth daily.   Ascorbic Acid (VITAMIN C PO) Take 2 tablets by mouth daily.   atorvastatin (LIPITOR) 40 MG tablet Take 1 tablet (40 mg total) by mouth daily.   azithromycin (ZITHROMAX) 500  MG tablet Take 1 tablet (500 mg total) by mouth daily.   benzonatate (TESSALON) 200 MG capsule Take 1 capsule (200 mg total) by mouth 3 (three) times daily as needed for cough.   budesonide-formoterol (SYMBICORT) 160-4.5 MCG/ACT inhaler Inhale 2 puffs into the lungs 2 (two) times daily.   chlorpheniramine-HYDROcodone (TUSSIONEX) 10-8 MG/5ML Take 5 mLs by mouth every 12 (twelve) hours as needed (intractable cough).   clopidogrel (PLAVIX) 75 MG tablet Take 75 mg by mouth daily.   fluticasone (FLONASE) 50 MCG/ACT nasal spray Place 2 sprays into both nostrils daily.   gabapentin (NEURONTIN) 300 MG capsule Take 1 capsule (300 mg total) by mouth 3 (three) times daily. (Patient taking differently: Take 600 mg by mouth daily.)   loratadine (CLARITIN) 10 MG tablet Take 1 tablet (10 mg total) by mouth daily.   losartan-hydrochlorothiazide (HYZAAR) 100-12.5 MG tablet Take 1 tablet by mouth daily.   metFORMIN (GLUCOPHAGE) 500 MG tablet Take 1 tablet (500 mg total) by mouth 2 (two) times daily with a meal.   montelukast (SINGULAIR) 10 MG tablet TAKE 1 TABLET BY MOUTH EVERYDAY AT BEDTIME (Patient taking differently: Take 10 mg by mouth as needed (allergies).)   naloxone (NARCAN) nasal spray 4 mg/0.1 mL SMARTSIG:Both Nares   nitroGLYCERIN (NITROSTAT) 0.4 MG SL tablet Place 0.4 mg under the tongue every 5 (five) minutes as needed for chest pain.   Oxycodone HCl 20 MG TABS Take 20 mg by mouth 4 (four) times daily.   oxymetazoline (AFRIN) 0.05 % nasal spray Place 1 spray into both  nostrils 2 (two) times daily for 2 days.   pantoprazole (PROTONIX) 40 MG tablet Take 1 tablet (40 mg total) by mouth daily.   predniSONE (DELTASONE) 10 MG tablet Take 40 mg daily for 1 day, 30 mg daily for 1 day, 20 mg daily for 1 days,10 mg daily for 1 day, then stop   sildenafil (VIAGRA) 100 MG tablet Take 1 tablet (100 mg total) by mouth daily as needed. (Patient taking differently: Take 100 mg by mouth daily as needed for erectile  dysfunction.)   tamsulosin (FLOMAX) 0.4 MG CAPS capsule Take 0.4 mg by mouth daily.   Vitamin D, Ergocalciferol, (DRISDOL) 1.25 MG (50000 UNIT) CAPS capsule Take 50,000 Units by mouth once a week. Mondays   XTAMPZA ER 9 MG C12A Take 9 mg by mouth 2 (two) times daily.                     Objective:   Physical Exam   Wts  11/22/2021      146   08/16/2021        149  11/11/2020        147  08/11/2020          144 01/15/2020        140 03/26/2019        144  11/20/2018      144  08/14/2018          144  02/08/2018          143  11/22/17 142 lb (64.4 kg)  10/05/16 131 lb 12.8 oz (59.8 kg)     Vital signs reviewed  11/22/2021  - Note at rest 02 sats  98% on RA   General appearance:    thin amb black male nad    HEENT :  Oropharynx  clear   Nasal turbinates nl    NECK :  without JVD/Nodes/TM/ nl carotid upstrokes bilaterally   LUNGS: no acc muscle use,  Mod barrel  contour chest wall with bilateral  Distant bs  esp L side with base dullness  and  without cough on insp or exp maneuvers and mod  Hyperresonant  to  percussion bilaterally     CV:  RRR  no s3 or murmur or increase in P2, and no edema   ABD:  soft and nontender with pos mid insp Hoover's  in the supine position. No bruits or organomegaly appreciated, bowel sounds nl  MS:   Ext warm without deformities or   obvious joint restrictions , calf tenderness, cyanosis or clubbing  SKIN: warm and dry without lesions    NEURO:  alert, approp, nl sensorium with  no motor or cerebellar deficits apparent.          I personally reviewed images and agree with radiology impression as follows:   Chest CT  10/16/21  1. Lung-RADS 1, negative. Continue annual screening with low-dose chest CT without contrast in 12 months. 2. Pulmonary sarcoid, unchanged. 3.  Emphysema (ICD10-J43.9).      Assessment:

## 2021-11-22 ENCOUNTER — Ambulatory Visit (INDEPENDENT_AMBULATORY_CARE_PROVIDER_SITE_OTHER): Payer: Medicaid Other | Admitting: Internal Medicine

## 2021-11-22 ENCOUNTER — Encounter: Payer: Self-pay | Admitting: Internal Medicine

## 2021-11-22 VITALS — BP 136/82 | HR 86 | Temp 98.1°F | Ht 65.0 in | Wt 146.6 lb

## 2021-11-22 DIAGNOSIS — J449 Chronic obstructive pulmonary disease, unspecified: Secondary | ICD-10-CM

## 2021-11-22 DIAGNOSIS — Z87891 Personal history of nicotine dependence: Secondary | ICD-10-CM

## 2021-11-22 DIAGNOSIS — J029 Acute pharyngitis, unspecified: Secondary | ICD-10-CM | POA: Diagnosis not present

## 2021-11-22 MED ORDER — FAMOTIDINE 20 MG PO TABS: ORAL_TABLET | ORAL | 11 refills | Status: AC

## 2021-11-22 NOTE — Assessment & Plan Note (Addendum)
Quit smoking 05/2020    - Spirometry 10/05/2016  FEV1 1.36 (52%)  Ratio 65 p saba with classic curvature - 10/05/2016  > try dulera 200 2bid   - Allergy profile 11/22/2017 >  Eos 0.6 /  IgE  25 RAST neg x for roaches/ neg for mold  - 11/22/2017   continue dulera 200 2bid  - PFT's  02/08/2018  FEV1 0.90 (36 % ) ratio 59   p multiple saba prior to study with DLCO  52 % corrects to 106  % for alv volume   02/08/2018  After extensive coaching inhaler device,  effectiveness =    90% from a baseline of 75% with smi so try spiriva respimat - 08/14/2018    changed to trelegy but preferred symb/spriva - 11/20/2018     Breztri > better but not covered by medicaid - 03/26/2019  After extensive coaching inhaler device,  effectiveness =    75% (short Ti) > rechallenge with Breztri > not covered, resumed symb 160 but only took prn - 01/15/2020 re-started on symb 160/spiriva 2.5 daily  - 08/11/2020  After extensive coaching inhaler device,  effectiveness =    75% > continue symbicort/ spiriva - PFT's  08/11/2020  FEV1 1.34 (54 % ) ratio 0.61  p 10 % improvement from saba p spiriva prior to study with DLCO  11.94 (52%) corrects to 3.41 (77%)  for alv volume and FV curve classic concavity   - 11/22/2021  After extensive coaching inhaler device,  effectiveness =    50% (short Ti) > continue symb/spiriva add max gerd rx and f/u in 6 weeks   Group D (now reclassified as E) in terms of symptom/risk and laba/lama/ICS  therefore appropriate rx at this point >>>  symbiocort 160/spiriva and more approp saba:  Re SABA :  I spent extra time with pt today reviewing appropriate use of albuterol for prn use on exertion with the following points: 1) saba is for relief of sob that does not improve by walking a slower pace or resting but rather if the pt does not improve after trying this first. 2) If the pt is convinced, as many are, that saba helps recover from activity faster then it's easy to tell if this is the case by re-challenging : ie  stop, take the inhaler, then p 5 minutes try the exact same activity (intensity of workload) that just caused the symptoms and see if they are substantially diminished or not after saba 3) if there is an activity that reproducibly causes the symptoms, try the saba 15 min before the activity on alternate days   If in fact the saba really does help, then fine to continue to use it prn but advised may need to look closer at the maintenance regimen being used to achieve better control of airways disease with exertion.   Referred to pulmonary rehab

## 2021-11-22 NOTE — Patient Instructions (Addendum)
Plan A = Automatic = Always=    Symbiocrt 160 Take 2 puffs first thing in am and then another 2 puffs about 12 hours later and spiriva 2 puffs in am  Practice with spiriva to get a much deeper breath for all your devices   Pantoprazole (protonix) 40 mg   Take  30-60 min before first meal of the day and Pepcid (famotidine)  20 mg after supper until return to office - this is the best way to tell whether stomach acid is contributing to your problem.    Jolley ranchers were great - don't use mint / menthol or chocalate    Plan B = Backup (to supplement plan A, not to replace it) Only use your albuterol inhaler as a rescue medication to be used if you can't catch your breath by resting or doing a relaxed purse lip breathing pattern.  - The less you use it, the better it will work when you need it. - Ok to use the inhaler up to 2 puffs  every 4 hours if you must but call for appointment if use goes up over your usual need - Don't leave home without it !!  (think of it like the spare tire for your car)   Plan C = Crisis (instead of Plan B but only if Plan B stops working) - only use your albuterol nebulizer if you first try Plan B and it fails to help > ok to use the nebulizer up to every 4 hours but if start needing it regularly call for immediate appointment   Ok to try albuterol 15 min before an activity (on alternating days)  that you know would usually make you short of breath and see if it makes any difference and if makes none then don't take albuterol after activity unless you can't catch your breath as this means it's the resting that helps, not the albuterol.      My office will be contacting you by phone for referral to ENT and pulmonary rehab   - if you don't hear back from my office within one week please call us back or notify us thru MyChart and we'll address it right away.   Please schedule a follow up office visit in 6 weeks, call sooner if needed with all medications /inhalers/  solutions in hand so we can verify exactly what you are taking. This includes all medications from all doctors and over the counters

## 2021-11-22 NOTE — Addendum Note (Signed)
Addended by: Irine Seal B on: 11/22/2021 03:25 PM   Modules accepted: Orders

## 2021-11-22 NOTE — Assessment & Plan Note (Signed)
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  Total time for H and P, chart review, counseling, reviewing hfa/neb device(s) and generating customized AVS unique to this office visit / same day charting  > 30 min for multiple  refractory respiratory  symptoms of uncertain etiology

## 2021-12-17 ENCOUNTER — Telehealth (HOSPITAL_COMMUNITY): Payer: Self-pay

## 2021-12-17 NOTE — Telephone Encounter (Signed)
Called to confirm appt. Left a message explaining directions to the department, proper footwear, COVID protocols, and call back number

## 2021-12-20 ENCOUNTER — Encounter (HOSPITAL_COMMUNITY): Payer: Self-pay

## 2021-12-20 ENCOUNTER — Encounter (HOSPITAL_COMMUNITY)
Admission: RE | Admit: 2021-12-20 | Discharge: 2021-12-20 | Disposition: A | Discharge status: Home / Self Care | Payer: Medicaid Other | Source: Ambulatory Visit | Attending: Internal Medicine | Admitting: Internal Medicine

## 2021-12-20 VITALS — BP 130/78 | HR 96 | Ht 65.0 in | Wt 151.0 lb

## 2021-12-20 DIAGNOSIS — J449 Chronic obstructive pulmonary disease, unspecified: Secondary | ICD-10-CM | POA: Diagnosis not present

## 2021-12-20 NOTE — Progress Notes (Signed)
Glenn [name] 61 y.o. male  Initial Psychosocial Assessment  Pt psychosocial assessment reveals pt lives with their spouse. Pt is currently unemployed, disabled. Pt hobbies include reading and playing games on his phone. Pt reports his  stress level is low. Areas of stress/anxiety include N/A.  Pt does not exhibit signs of depression. PHQ score 0/3. Pt shows good  coping skills with positive outlook . Offered emotional support and reassurance.Will continue to monitor.     12/20/2021 11:09 AM

## 2021-12-20 NOTE — Progress Notes (Signed)
Pulmonary Individual Treatment Plan  Patient Details  Name: Glenn [name] MRN: [id number] Date of Birth: [birth year] Referring Provider:   April Manson Pulmonary Rehab Walk Test from 12/20/2021 in [facility] [place]  Referring Provider Wert       Initial Encounter Date:  [place] [facility] Walk Test from 12/20/2021 in [facility]  Date 12/20/21       Visit Diagnosis: Stage 2 moderate COPD by GOLD classification ([place])  Patient's Home Medications on Admission:   Current Outpatient Medications:    albuterol (PROAIR HFA) 108 (90 Base) MCG/ACT inhaler, 2 puffs every 4 hours as needed only  if your can't catch your breath (Patient taking differently: Inhale 2 puffs into the lungs 6 (six) times daily.), Disp: 18 g, Rfl: 11   albuterol (PROVENTIL) (2.5 MG/3ML) 0.083% nebulizer solution, Take 3 mLs (2.5 mg total) by nebulization every 4 (four) hours as needed for wheezing or shortness of breath., Disp: 180 mL, Rfl: 11   AMITIZA 24 MCG capsule, Take 24 mcg by mouth daily as needed for constipation., Disp: , Rfl:    amLODipine (NORVASC) 10 MG tablet, Take 1 tablet (10 mg total) by mouth daily., Disp: 30 tablet, Rfl: 3   Ascorbic Acid (VITAMIN C PO), Take 2 tablets by mouth daily., Disp: , Rfl:    atorvastatin (LIPITOR) 40 MG tablet, Take 1 tablet (40 mg total) by mouth daily., Disp: 30 tablet, Rfl: 2   azithromycin (ZITHROMAX) 500 MG tablet, Take 1 tablet (500 mg total) by mouth daily., Disp: 2 tablet, Rfl: 0   benzonatate (TESSALON) 200 MG capsule, Take 1 capsule (200 mg total) by mouth 3 (three) times daily as needed for cough., Disp: 30 capsule, Rfl: 0   budesonide-formoterol (SYMBICORT) 160-4.5 MCG/ACT inhaler, Inhale 2 puffs into the lungs 2 (two) times daily., Disp: 1 each, Rfl: 11   chlorpheniramine-HYDROcodone (TUSSIONEX) 10-8 MG/5ML, Take 5 mLs by mouth every 12  (twelve) hours as needed (intractable cough)., Disp: 70 mL, Rfl: 0   clopidogrel (PLAVIX) 75 MG tablet, Take 75 mg by mouth daily., Disp: , Rfl:    famotidine (PEPCID) 20 MG tablet, One after supper, Disp: 30 tablet, Rfl: 11   fluticasone (FLONASE) 50 MCG/ACT nasal spray, Place 2 sprays into both nostrils daily., Disp: 16 g, Rfl: 2   gabapentin (NEURONTIN) 300 MG capsule, Take 1 capsule (300 mg total) by mouth 3 (three) times daily. (Patient taking differently: Take 600 mg by mouth daily.), Disp: 50 capsule, Rfl: 0   loratadine (CLARITIN) 10 MG tablet, Take 1 tablet (10 mg total) by mouth daily., Disp: 30 tablet, Rfl: 0   losartan-hydrochlorothiazide (HYZAAR) 100-12.5 MG tablet, Take 1 tablet by mouth daily., Disp: , Rfl:    metFORMIN (GLUCOPHAGE) 500 MG tablet, Take 1 tablet (500 mg total) by mouth 2 (two) times daily with a meal., Disp: 60 tablet, Rfl: 3   montelukast (SINGULAIR) 10 MG tablet, TAKE 1 TABLET BY MOUTH EVERYDAY AT BEDTIME (Patient taking differently: Take 10 mg by mouth as needed (allergies).), Disp: 90 tablet, Rfl: 3   naloxone (NARCAN) nasal spray 4 mg/0.1 mL, SMARTSIG:Both Nares, Disp: , Rfl:    nitroGLYCERIN (NITROSTAT) 0.4 MG SL tablet, Place 0.4 mg under the tongue every 5 (five) minutes as needed for chest pain., Disp: , Rfl:    Oxycodone HCl 20 MG TABS, Take 20 mg by mouth 4 (four) times daily., Disp: ,  Rfl:    pantoprazole (PROTONIX) 40 MG tablet, Take 1 tablet (40 mg total) by mouth daily., Disp: 30 tablet, Rfl: 0   predniSONE (DELTASONE) 10 MG tablet, Take 40 mg daily for 1 day, 30 mg daily for 1 day, 20 mg daily for 1 days,10 mg daily for 1 day, then stop, Disp: 10 tablet, Rfl: 0   sildenafil (VIAGRA) 100 MG tablet, Take 1 tablet (100 mg total) by mouth daily as needed. (Patient taking differently: Take 100 mg by mouth daily as needed for erectile dysfunction.), Disp: 30 tablet, Rfl: 5   tamsulosin (FLOMAX) 0.4 MG CAPS capsule, Take 0.4 mg by mouth daily., Disp: , Rfl:     Tiotropium Bromide Monohydrate 2.5 MCG/ACT AERS, Inhale 2 each into the lungs daily., Disp: , Rfl:    Vitamin D, Ergocalciferol, (DRISDOL) 1.25 MG (50000 UNIT) CAPS capsule, Take 50,000 Units by mouth once a week. Mondays, Disp: , Rfl:    XTAMPZA ER 9 MG C12A, Take 9 mg by mouth 2 (two) times daily., Disp: , Rfl:   Past Medical History: Past Medical History:  Diagnosis Date   Anxiety    Arthritis    Asthma    Chronic pain 05/06/2020   COPD (chronic obstructive pulmonary disease) (HCC)    CVA (cerebral vascular accident) ([place])    2010; 06/2020   Depression    Diabetes mellitus type 2 in nonobese ([place])    Dyspnea    ED (erectile dysfunction)    Hyperlipidemia    Hypertension    Sarcoid    Tobacco dependence 05/06/2020    Tobacco Use: Social History   Tobacco Use  Smoking Status Former   Packs/day: 1.00   Years: 34.00   Total pack years: 34.00   Types: Cigarettes   Quit date: 05/11/2020   Years since quitting: 1.6   Passive exposure: Never  Smokeless Tobacco Never  Tobacco Comments   at the most smoked 6-7 cigarettes/day    Labs: Review Flowsheet  More data exists      Latest Ref Rng & Units 06/24/2020 04/20/2021 05/23/2021 09/30/2021 11/17/2021  Labs for ITP Cardiac and Pulmonary Rehab  Cholestrol 0 - 200 mg/dL 241  - - - -  LDL (calc) 0 - 99 mg/dL 135  - - - -  HDL-C >40 mg/dL 86  - - - -  Trlycerides <150 mg/dL 98  - - - -  Hemoglobin A1c 4.8 - 5.6 % 6.8  7.6  - - -  Bicarbonate 20.0 - 28.0 mmol/L - - 31.5  30.2  28.4   TCO2 22 - 32 mmol/L - - - 32  30   O2 Saturation % - - 90.4  99  99     Capillary Blood Glucose: Lab Results  Component Value Date   GLUCAP 102 (H) 10/03/2021   GLUCAP 179 (H) 10/03/2021   GLUCAP 254 (H) 10/02/2021   GLUCAP 261 (H) 10/02/2021   GLUCAP 154 (H) 10/02/2021     Pulmonary Assessment Scores:  Pulmonary Assessment Scores     Row Name 12/20/21 1100         ADL UCSD   ADL Phase Entry     SOB Score total 45       CAT Score    CAT Score 15       mMRC Score   mMRC Score 2             UCSD: Self-administered rating of dyspnea associated with activities of daily living (ADLs)  6-point scale (0 = "not at all" to 5 = "maximal or unable to do because of breathlessness")  Scoring Scores range from 0 to 120.  Minimally important difference is 5 units  CAT: CAT can identify the health impairment of COPD patients and is better correlated with disease progression.  CAT has a scoring range of zero to 40. The CAT score is classified into four groups of low (less than 10), medium (10 - 20), high (21-30) and very high (31-40) based on the impact level of disease on health status. A CAT score over 10 suggests significant symptoms.  A worsening CAT score could be explained by an exacerbation, poor medication adherence, poor inhaler technique, or progression of COPD or comorbid conditions.  CAT MCID is 2 points  mMRC: mMRC (Modified Medical Research Council) Dyspnea Scale is used to assess the degree of baseline functional disability in patients of respiratory disease due to dyspnea. No minimal important difference is established. A decrease in score of 1 point or greater is considered a positive change.   Pulmonary Function Assessment:  Pulmonary Function Assessment - 12/20/21 1059       Breath   Bilateral Breath Sounds Clear;Decreased    Shortness of Breath Yes;Limiting activity;Fear of Shortness of Breath             Exercise Target Goals: Exercise Program Goal: Individual exercise prescription set using results from initial 6 min walk test and THRR while considering  patient's activity barriers and safety.   Exercise Prescription Goal: Initial exercise prescription builds to 30-45 minutes a day of aerobic activity, 2-3 days per week.  Home exercise guidelines will be given to patient during program as part of exercise prescription that the participant will acknowledge.  Activity Barriers & Risk  Stratification:  Activity Barriers & Cardiac Risk Stratification - 12/20/21 1102       Activity Barriers & Cardiac Risk Stratification   Activity Barriers Back Problems;Arthritis;Muscular Weakness;Shortness of Breath;Deconditioning             6 Minute Walk:  6 Minute Walk     Row Name 12/20/21 1202         6 Minute Walk   Phase Initial     Distance 1100 feet     Walk Time 6 minutes     # of Rest Breaks 0     MPH 2.08     METS 3.15     RPE 11     Perceived Dyspnea  1     VO2 Peak 11.02     Symptoms No     Resting HR 99 bpm     Resting BP 130/78     Resting Oxygen Saturation  96 %     Exercise Oxygen Saturation  during 6 min walk 86 %     Max Ex. HR 100 bpm     Max Ex. BP 140/78     2 Minute Post BP 120/80       Interval HR   1 Minute HR 100     2 Minute HR 100     3 Minute HR 98     4 Minute HR 97     5 Minute HR 97     6 Minute HR 100     2 Minute Post HR 100     Interval Heart Rate? Yes       Interval Oxygen   Interval Oxygen? Yes     Baseline Oxygen Saturation % 96 %  1 Minute Oxygen Saturation % 93 %     1 Minute Liters of Oxygen 0 L     2 Minute Oxygen Saturation % 89 %     2 Minute Liters of Oxygen 0 L     3 Minute Oxygen Saturation % 87 %     3 Minute Liters of Oxygen 0 L     4 Minute Oxygen Saturation % 86 %     4 Minute Liters of Oxygen 0 L     5 Minute Oxygen Saturation % 88 %     5 Minute Liters of Oxygen 0 L     6 Minute Oxygen Saturation % 89 %     6 Minute Liters of Oxygen 0 L     2 Minute Post Oxygen Saturation % 99 %     2 Minute Post Liters of Oxygen 0 L              Oxygen Initial Assessment:  Oxygen Initial Assessment - 12/20/21 1058       Home Oxygen   Home Oxygen Device None    Sleep Oxygen Prescription None    Home Exercise Oxygen Prescription None    Home Resting Oxygen Prescription None      Initial 6 min Walk   Oxygen Used None      Program Oxygen Prescription   Program Oxygen Prescription None       Intervention   Short Term Goals To learn and understand importance of monitoring SPO2 with pulse oximeter and demonstrate accurate use of the pulse oximeter.;To learn and demonstrate proper pursed lip breathing techniques or other breathing techniques. ;To learn and exhibit compliance with exercise, home and travel O2 prescription;To learn and understand importance of maintaining oxygen saturations>88%;To learn and demonstrate proper use of respiratory medications    [place]  Term Goals Exhibits compliance with exercise, home  and travel O2 prescription;Verbalizes importance of monitoring SPO2 with pulse oximeter and return demonstration;Maintenance of O2 saturations>88%;Exhibits proper breathing techniques, such as pursed lip breathing or other method taught during program session;Compliance with respiratory medication;Demonstrates proper use of MDI's             Oxygen Re-Evaluation:   Oxygen Discharge (Final Oxygen Re-Evaluation):   Initial Exercise Prescription:  Initial Exercise Prescription - 12/20/21 1200       Date of Initial Exercise RX and Referring Provider   Date 12/20/21    Referring Provider Wert    Expected Discharge Date 02/24/22      Recumbant Elliptical   Level 1    Minutes 15    METs 2      Track   Minutes 15    METs 2      Prescription Details   Frequency (times per week) 2    Duration Progress to 30 minutes of continuous aerobic without signs/symptoms of physical distress      Intensity   THRR 40-80% of Max Heartrate 63-126    Ratings of Perceived Exertion 11-13    Perceived Dyspnea 0-4      Progression   Progression Continue to progress workloads to maintain intensity without signs/symptoms of physical distress.      Resistance Training   Training Prescription Yes    Weight blue bands    Reps 10-15             Perform Capillary Blood Glucose checks as needed.  Exercise Prescription Changes:   Exercise Comments:   Exercise Goals and  Review:  Exercise Goals     Row Name 12/20/21 1102             Exercise Goals   Increase Physical Activity Yes       Intervention Provide advice, education, support and counseling about physical activity/exercise needs.;Develop an individualized exercise prescription for aerobic and resistive training based on initial evaluation findings, risk stratification, comorbidities and participant's personal goals.       Expected Outcomes Short Term: Attend rehab on a regular basis to increase amount of physical activity.;[place] Term: Add in home exercise to make exercise part of routine and to increase amount of physical activity.;[place] Term: Exercising regularly at least 3-5 days a week.       Increase Strength and Stamina Yes       Intervention Provide advice, education, support and counseling about physical activity/exercise needs.;Develop an individualized exercise prescription for aerobic and resistive training based on initial evaluation findings, risk stratification, comorbidities and participant's personal goals.       Expected Outcomes Short Term: Increase workloads from initial exercise prescription for resistance, speed, and METs.;Short Term: Perform resistance training exercises routinely during rehab and add in resistance training at home;Azzaro Term: Improve cardiorespiratory fitness, muscular endurance and strength as measured by increased METs and functional capacity (6MWT)       Able to understand and use rate of perceived exertion (RPE) scale Yes       Intervention Provide education and explanation on how to use RPE scale       Expected Outcomes Short Term: Able to use RPE daily in rehab to express subjective intensity level;[place] Term:  Able to use RPE to guide intensity level when exercising independently       Able to understand and use Dyspnea scale Yes       Intervention Provide education and explanation on how to use Dyspnea scale       Expected Outcomes Short Term: Able to use  Dyspnea scale daily in rehab to express subjective sense of shortness of breath during exertion;Sorn Term: Able to use Dyspnea scale to guide intensity level when exercising independently       Knowledge and understanding of Target Heart Rate Range (THRR) Yes       Intervention Provide education and explanation of THRR including how the numbers were predicted and where they are located for reference       Expected Outcomes Short Term: Able to state/look up THRR;[place] Term: Able to use THRR to govern intensity when exercising independently;Short Term: Able to use daily as guideline for intensity in rehab       Understanding of Exercise Prescription Yes       Intervention Provide education, explanation, and written materials on patient's individual exercise prescription       Expected Outcomes Short Term: Able to explain program exercise prescription;[place]han Term: Able to explain home exercise prescription to exercise independently                Exercise Goals Re-Evaluation :   Discharge Exercise Prescription (Final Exercise Prescription Changes):   Nutrition:  Target Goals: Understanding of nutrition guidelines, daily intake of sodium <1553m, cholesterol <2055m calories 30% from fat and 7% or less from saturated fats, daily to have 5 or more servings of fruits and vegetables.  Biometrics:  Pre Biometrics - 12/20/21 1033       Pre Biometrics   Grip Strength 29 kg              Nutrition Therapy  Plan and Nutrition Goals:   Nutrition Assessments:  MEDIFICTS Score Key: ?70 Need to make dietary changes  40-70 Heart Healthy Diet ? 40 Therapeutic Level Cholesterol Diet   Picture Your Plate Scores: <56 Unhealthy dietary pattern with much room for improvement. 41-50 Dietary pattern unlikely to meet recommendations for good health and room for improvement. 51-60 More healthful dietary pattern, with some room for improvement.  >60 Healthy dietary pattern, although there may be  some specific behaviors that could be improved.    Nutrition Goals Re-Evaluation:   Nutrition Goals Discharge (Final Nutrition Goals Re-Evaluation):   Psychosocial: Target Goals: Acknowledge presence or absence of significant depression and/or stress, maximize coping skills, provide positive support system. Participant is able to verbalize types and ability to use techniques and skills needed for reducing stress and depression.  Initial Review & Psychosocial Screening:  Initial Psych Review & Screening - 12/20/21 1053       Initial Review   Current issues with Current Anxiety/Panic;Current Psychotropic Meds    Comments on medication for anxiety      Family Dynamics   Good Support System? Yes    Comments spouse      Barriers   Psychosocial barriers to participate in program There are no identifiable barriers or psychosocial needs.      Screening Interventions   Interventions Encouraged to exercise             Quality of Life Scores:  Scores of 19 and below usually indicate a poorer quality of life in these areas.  A difference of  2-3 points is a clinically meaningful difference.  A difference of 2-3 points in the total score of the Quality of Life Index has been associated with significant improvement in overall quality of life, self-image, physical symptoms, and general health in studies assessing change in quality of life.  PHQ-9: Review Flowsheet       12/20/2021 08/06/2020  Depression screen PHQ 2/9  Decreased Interest 0 0  Down, Depressed, Hopeless 0 0  PHQ - 2 Score 0 0  Altered sleeping 3 -  Tired, decreased energy 0 -  Change in appetite 0 -  Feeling bad or failure about yourself  0 -  Trouble concentrating 0 -  Moving slowly or fidgety/restless 0 -  Suicidal thoughts 0 -  PHQ-9 Score 3 -  Difficult doing work/chores Somewhat difficult -   Interpretation of Total Score  Total Score Depression Severity:  1-4 = Minimal depression, 5-9 = Mild  depression, 10-14 = Moderate depression, 15-19 = Moderately severe depression, 20-27 = Severe depression   Psychosocial Evaluation and Intervention:  Psychosocial Evaluation - 12/20/21 1054       Psychosocial Evaluation & Interventions   Interventions Encouraged to exercise with the program and follow exercise prescription    Comments Destiny feels that his anxiety medication is helping. Denies other psychosocial barriers.    Expected Outcomes For Baran to participate in PR free of psychosocial barriers    Continue Psychosocial Services  No Follow up required             Psychosocial Re-Evaluation:   Psychosocial Discharge (Final Psychosocial Re-Evaluation):   Education: Education Goals: Education classes will be provided on a weekly basis, covering required topics. Participant will state understanding/return demonstration of topics presented.  Learning Barriers/Preferences:  Learning Barriers/Preferences - 12/20/21 1057       Learning Barriers/Preferences   Learning Barriers Sight   wears glasses   Learning Preferences Skilled Demonstration;Verbal Instruction;Audio  Education Topics: Introduction to Pulmonary Rehab Group instruction provided by PowerPoint, verbal discussion, and written material to support subject matter. Instructor reviews what Pulmonary Rehab is, the purpose of the program, and how patients are referred.     Know Your Numbers Group instruction that is supported by a PowerPoint presentation. Instructor discusses importance of knowing and understanding resting, exercise, and post-exercise oxygen saturation, heart rate, and blood pressure. Oxygen saturation, heart rate, blood pressure, rating of perceived exertion, and dyspnea are reviewed along with a normal range for these values.    Exercise for the Pulmonary Patient Group instruction that is supported by a PowerPoint presentation. Instructor discusses benefits of exercise, core  components of exercise, frequency, duration, and intensity of an exercise routine, importance of utilizing pulse oximetry during exercise, safety while exercising, and options of places to exercise outside of rehab.       MET Level  Group instruction provided by PowerPoint, verbal discussion, and written material to support subject matter. Instructor reviews what METs are and how to increase METs.    Pulmonary Medications Verbally interactive group education provided by instructor with focus on inhaled medications and proper administration.   Anatomy and Physiology of the Respiratory System Group instruction provided by PowerPoint, verbal discussion, and written material to support subject matter. Instructor reviews respiratory cycle and anatomical components of the respiratory system and their functions. Instructor also reviews differences in obstructive and restrictive respiratory diseases with examples of each.    Oxygen Safety Group instruction provided by PowerPoint, verbal discussion, and written material to support subject matter. There is an overview of "What is Oxygen" and "Why do we need it".  Instructor also reviews how to create a safe environment for oxygen use, the importance of using oxygen as prescribed, and the risks of noncompliance. There is a brief discussion on traveling with oxygen and resources the patient may utilize.   Oxygen Use Group instruction provided by PowerPoint, verbal discussion, and written material to discuss how supplemental oxygen is prescribed and different types of oxygen supply systems. Resources for more information are provided.    Breathing Techniques Group instruction that is supported by demonstration and informational handouts. Instructor discusses the benefits of pursed lip and diaphragmatic breathing and detailed demonstration on how to perform both.     Risk Factor Reduction Group instruction that is supported by a PowerPoint  presentation. Instructor discusses the definition of a risk factor, different risk factors for pulmonary disease, and how the heart and lungs work together.   MD Day A group question and answer session with a medical doctor that allows participants to ask questions that relate to their pulmonary disease state.   Nutrition for the Pulmonary Patient Group instruction provided by PowerPoint slides, verbal discussion, and written materials to support subject matter. The instructor gives an explanation and review of healthy diet recommendations, which includes a discussion on weight management, recommendations for fruit and vegetable consumption, as well as protein, fluid, caffeine, fiber, sodium, sugar, and alcohol. Tips for eating when patients are short of breath are discussed.    Other Education Group or individual verbal, written, or video instructions that support the educational goals of the pulmonary rehab program.    Knowledge Questionnaire Score:  Knowledge Questionnaire Score - 12/20/21 1222       Knowledge Questionnaire Score   Pre Score 14/18             Core Components/Risk Factors/Patient Goals at Admission:  Personal Goals and Risk Factors at Admission -  12/20/21 1058       Core Components/Risk Factors/Patient Goals on Admission   Improve shortness of breath with ADL's Yes    Intervention Provide education, individualized exercise plan and daily activity instruction to help decrease symptoms of SOB with activities of daily living.    Expected Outcomes Short Term: Improve cardiorespiratory fitness to achieve a reduction of symptoms when performing ADLs;[place] Term: Be able to perform more ADLs without symptoms or delay the onset of symptoms             Core Components/Risk Factors/Patient Goals Review:    Core Components/Risk Factors/Patient Goals at Discharge (Final Review):    ITP Comments: Dr. Rodman Pickle is Medical Director for Pulmonary Rehab at [facility].

## 2021-12-20 NOTE — Progress Notes (Signed)
Glenn [name] 61 y.o. male Pulmonary Rehab Orientation Note This patient who was referred to Pulmonary Rehab by Dr. Sherene Sires with the diagnosis of COPD stage 2 arrived today in Cardiac and Pulmonary Rehab. He arrived ambulatory with normal gait. He does not carry portable oxygen. Per patient, Glenn [name] uses oxygen never. Color good, skin warm and dry. Patient is oriented to time and place. Patient's medical history, psychosocial health, and medications reviewed. Psychosocial assessment reveals patient lives with spouse. Glenn [name] is currently unemployed, disabled. Patient hobbies include reading and playing games on his phone. Patient reports his stress level is low. Areas of stress/anxiety include N/A. Patient does not exhibit signs of depression. PHQ2/9 score 0/3. Glenn [name] shows good  coping skills with positive outlook on life. Offered emotional support and reassurance. Will continue to monitor. Physical assessment performed by Karlene Lineman RN. Please see their orientation physical assessment note. Glenn [name] reports he  does take medications as prescribed. Patient states he  follows a diabetic diet. The patient reports no specific efforts to gain or lose weight.. Patient's weight will be monitored closely. Demonstration and practice of PLB using pulse oximeter. Glenn [name] able to return demonstration satisfactorily. Safety and hand hygiene in the exercise area reviewed with patient. Glenn [name] voices understanding of the information reviewed. Department expectations discussed with patient and achievable goals were set. The patient shows enthusiasm about attending the program and we look forward to working with Glenn [name]. Glenn [name] completed a 6 min walk test today and is scheduled to begin exercise on 01/04/22 at 10:15 am.   [id number] Glenn San, MS, ACSM-CEP

## 2021-12-21 NOTE — Progress Notes (Signed)
Pulmonary Rehab Orientation Physical Assessment Note  Physical assessment reveals  Pt is alert and oriented x 3.  Heart rate is normal, breath sounds clear to auscultation, no wheezes, rales, or rhonchi on the right with diminished throughout on the left. Reportsnon-productive cough. Bowel sounds present.  Pt denies abdominal discomfort, nausea, vomiting or diarrhea. Grip strength equal, strong. Distal pulses palpable; trace swelling to lower extremities. Alanson Aly, BSN Cardiac and Emergency planning/management officer

## 2021-12-28 ENCOUNTER — Inpatient Hospital Stay (HOSPITAL_COMMUNITY): Admission: RE | Admit: 2021-12-28 | Payer: Medicaid Other | Source: Ambulatory Visit

## 2021-12-28 ENCOUNTER — Ambulatory Visit (HOSPITAL_COMMUNITY): Payer: Medicaid Other

## 2021-12-29 NOTE — Progress Notes (Addendum)
Pulmonary Individual Treatment Plan  Patient Details  Name: IRBY FAILS MRN: [id number] Date of Birth: [birth year] Referring Provider:   April Manson Pulmonary Rehab Walk Test from 12/20/2021 in [facility] [place]  Referring Provider Wert       Initial Encounter Date:  [place] [facility] Walk Test from 12/20/2021 in [facility]  Date 12/20/21       Visit Diagnosis: Stage 2 moderate COPD by GOLD classification ([place])  Patient's Home Medications on Admission:   Current Outpatient Medications:    albuterol (PROAIR HFA) 108 (90 Base) MCG/ACT inhaler, 2 puffs every 4 hours as needed only  if your can't catch your breath (Patient taking differently: Inhale 2 puffs into the lungs 6 (six) times daily.), Disp: 18 g, Rfl: 11   albuterol (PROVENTIL) (2.5 MG/3ML) 0.083% nebulizer solution, Take 3 mLs (2.5 mg total) by nebulization every 4 (four) hours as needed for wheezing or shortness of breath., Disp: 180 mL, Rfl: 11   AMITIZA 24 MCG capsule, Take 24 mcg by mouth daily as needed for constipation., Disp: , Rfl:    amLODipine (NORVASC) 10 MG tablet, Take 1 tablet (10 mg total) by mouth daily., Disp: 30 tablet, Rfl: 3   Ascorbic Acid (VITAMIN C PO), Take 2 tablets by mouth daily., Disp: , Rfl:    atorvastatin (LIPITOR) 40 MG tablet, Take 1 tablet (40 mg total) by mouth daily., Disp: 30 tablet, Rfl: 2   azithromycin (ZITHROMAX) 500 MG tablet, Take 1 tablet (500 mg total) by mouth daily., Disp: 2 tablet, Rfl: 0   benzonatate (TESSALON) 200 MG capsule, Take 1 capsule (200 mg total) by mouth 3 (three) times daily as needed for cough., Disp: 30 capsule, Rfl: 0   budesonide-formoterol (SYMBICORT) 160-4.5 MCG/ACT inhaler, Inhale 2 puffs into the lungs 2 (two) times daily., Disp: 1 each, Rfl: 11   chlorpheniramine-HYDROcodone (TUSSIONEX) 10-8 MG/5ML, Take 5 mLs by mouth every 12  (twelve) hours as needed (intractable cough)., Disp: 70 mL, Rfl: 0   clopidogrel (PLAVIX) 75 MG tablet, Take 75 mg by mouth daily., Disp: , Rfl:    famotidine (PEPCID) 20 MG tablet, One after supper, Disp: 30 tablet, Rfl: 11   fluticasone (FLONASE) 50 MCG/ACT nasal spray, Place 2 sprays into both nostrils daily., Disp: 16 g, Rfl: 2   gabapentin (NEURONTIN) 300 MG capsule, Take 1 capsule (300 mg total) by mouth 3 (three) times daily. (Patient taking differently: Take 600 mg by mouth daily.), Disp: 50 capsule, Rfl: 0   loratadine (CLARITIN) 10 MG tablet, Take 1 tablet (10 mg total) by mouth daily., Disp: 30 tablet, Rfl: 0   losartan-hydrochlorothiazide (HYZAAR) 100-12.5 MG tablet, Take 1 tablet by mouth daily., Disp: , Rfl:    metFORMIN (GLUCOPHAGE) 500 MG tablet, Take 1 tablet (500 mg total) by mouth 2 (two) times daily with a meal., Disp: 60 tablet, Rfl: 3   montelukast (SINGULAIR) 10 MG tablet, TAKE 1 TABLET BY MOUTH EVERYDAY AT BEDTIME (Patient taking differently: Take 10 mg by mouth as needed (allergies).), Disp: 90 tablet, Rfl: 3   naloxone (NARCAN) nasal spray 4 mg/0.1 mL, SMARTSIG:Both Nares, Disp: , Rfl:    nitroGLYCERIN (NITROSTAT) 0.4 MG SL tablet, Place 0.4 mg under the tongue every 5 (five) minutes as needed for chest pain., Disp: , Rfl:    Oxycodone HCl 20 MG TABS, Take 20 mg by mouth 4 (four) times daily., Disp: ,  Rfl:    pantoprazole (PROTONIX) 40 MG tablet, Take 1 tablet (40 mg total) by mouth daily., Disp: 30 tablet, Rfl: 0   predniSONE (DELTASONE) 10 MG tablet, Take 40 mg daily for 1 day, 30 mg daily for 1 day, 20 mg daily for 1 days,10 mg daily for 1 day, then stop, Disp: 10 tablet, Rfl: 0   sildenafil (VIAGRA) 100 MG tablet, Take 1 tablet (100 mg total) by mouth daily as needed. (Patient taking differently: Take 100 mg by mouth daily as needed for erectile dysfunction.), Disp: 30 tablet, Rfl: 5   tamsulosin (FLOMAX) 0.4 MG CAPS capsule, Take 0.4 mg by mouth daily., Disp: , Rfl:     Tiotropium Bromide Monohydrate 2.5 MCG/ACT AERS, Inhale 2 each into the lungs daily., Disp: , Rfl:    Vitamin D, Ergocalciferol, (DRISDOL) 1.25 MG (50000 UNIT) CAPS capsule, Take 50,000 Units by mouth once a week. Mondays, Disp: , Rfl:    XTAMPZA ER 9 MG C12A, Take 9 mg by mouth 2 (two) times daily., Disp: , Rfl:   Past Medical History: Past Medical History:  Diagnosis Date   Anxiety    Arthritis    Asthma    Chronic pain 05/06/2020   COPD (chronic obstructive pulmonary disease) (HCC)    CVA (cerebral vascular accident) ([place])    2010; 06/2020   Depression    Diabetes mellitus type 2 in nonobese ([place])    Dyspnea    ED (erectile dysfunction)    Hyperlipidemia    Hypertension    Sarcoid    Tobacco dependence 05/06/2020    Tobacco Use: Social History   Tobacco Use  Smoking Status Former   Packs/day: 1.00   Years: 34.00   Total pack years: 34.00   Types: Cigarettes   Quit date: 05/11/2020   Years since quitting: 1.6   Passive exposure: Never  Smokeless Tobacco Never  Tobacco Comments   at the most smoked 6-7 cigarettes/day    Labs: Review Flowsheet  More data exists      Latest Ref Rng & Units 06/24/2020 04/20/2021 05/23/2021 09/30/2021 11/17/2021  Labs for ITP Cardiac and Pulmonary Rehab  Cholestrol 0 - 200 mg/dL 241  - - - -  LDL (calc) 0 - 99 mg/dL 135  - - - -  HDL-C >40 mg/dL 86  - - - -  Trlycerides <150 mg/dL 98  - - - -  Hemoglobin A1c 4.8 - 5.6 % 6.8  7.6  - - -  Bicarbonate 20.0 - 28.0 mmol/L - - 31.5  30.2  28.4   TCO2 22 - 32 mmol/L - - - 32  30   O2 Saturation % - - 90.4  99  99     Capillary Blood Glucose: Lab Results  Component Value Date   GLUCAP 102 (H) 10/03/2021   GLUCAP 179 (H) 10/03/2021   GLUCAP 254 (H) 10/02/2021   GLUCAP 261 (H) 10/02/2021   GLUCAP 154 (H) 10/02/2021     Pulmonary Assessment Scores:  Pulmonary Assessment Scores     Row Name 12/20/21 1100         ADL UCSD   ADL Phase Entry     SOB Score total 45       CAT Score    CAT Score 15       mMRC Score   mMRC Score 2             UCSD: Self-administered rating of dyspnea associated with activities of daily living (ADLs)  6-point scale (0 = "not at all" to 5 = "maximal or unable to do because of breathlessness")  Scoring Scores range from 0 to 120.  Minimally important difference is 5 units  CAT: CAT can identify the health impairment of COPD patients and is better correlated with disease progression.  CAT has a scoring range of zero to 40. The CAT score is classified into four groups of low (less than 10), medium (10 - 20), high (21-30) and very high (31-40) based on the impact level of disease on health status. A CAT score over 10 suggests significant symptoms.  A worsening CAT score could be explained by an exacerbation, poor medication adherence, poor inhaler technique, or progression of COPD or comorbid conditions.  CAT MCID is 2 points  mMRC: mMRC (Modified Medical Research Council) Dyspnea Scale is used to assess the degree of baseline functional disability in patients of respiratory disease due to dyspnea. No minimal important difference is established. A decrease in score of 1 point or greater is considered a positive change.   Pulmonary Function Assessment:  Pulmonary Function Assessment - 12/20/21 1059       Breath   Bilateral Breath Sounds Clear;Decreased    Shortness of Breath Yes;Limiting activity;Fear of Shortness of Breath             Exercise Target Goals: Exercise Program Goal: Individual exercise prescription set using results from initial 6 min walk test and THRR while considering  patient's activity barriers and safety.   Exercise Prescription Goal: Initial exercise prescription builds to 30-45 minutes a day of aerobic activity, 2-3 days per week.  Home exercise guidelines will be given to patient during program as part of exercise prescription that the participant will acknowledge.  Activity Barriers & Risk  Stratification:  Activity Barriers & Cardiac Risk Stratification - 12/20/21 1102       Activity Barriers & Cardiac Risk Stratification   Activity Barriers Back Problems;Arthritis;Muscular Weakness;Shortness of Breath;Deconditioning             6 Minute Walk:  6 Minute Walk     Row Name 12/20/21 1202         6 Minute Walk   Phase Initial     Distance 1100 feet     Walk Time 6 minutes     # of Rest Breaks 0     MPH 2.08     METS 3.15     RPE 11     Perceived Dyspnea  1     VO2 Peak 11.02     Symptoms No     Resting HR 99 bpm     Resting BP 130/78     Resting Oxygen Saturation  96 %     Exercise Oxygen Saturation  during 6 min walk 86 %     Max Ex. HR 100 bpm     Max Ex. BP 140/78     2 Minute Post BP 120/80       Interval HR   1 Minute HR 100     2 Minute HR 100     3 Minute HR 98     4 Minute HR 97     5 Minute HR 97     6 Minute HR 100     2 Minute Post HR 100     Interval Heart Rate? Yes       Interval Oxygen   Interval Oxygen? Yes     Baseline Oxygen Saturation % 96 %  1 Minute Oxygen Saturation % 93 %     1 Minute Liters of Oxygen 0 L     2 Minute Oxygen Saturation % 89 %     2 Minute Liters of Oxygen 0 L     3 Minute Oxygen Saturation % 87 %     3 Minute Liters of Oxygen 0 L     4 Minute Oxygen Saturation % 86 %     4 Minute Liters of Oxygen 0 L     5 Minute Oxygen Saturation % 88 %     5 Minute Liters of Oxygen 0 L     6 Minute Oxygen Saturation % 89 %     6 Minute Liters of Oxygen 0 L     2 Minute Post Oxygen Saturation % 99 %     2 Minute Post Liters of Oxygen 0 L              Oxygen Initial Assessment:  Oxygen Initial Assessment - 12/20/21 1058       Home Oxygen   Home Oxygen Device None    Sleep Oxygen Prescription None    Home Exercise Oxygen Prescription None    Home Resting Oxygen Prescription None      Initial 6 min Walk   Oxygen Used None      Program Oxygen Prescription   Program Oxygen Prescription None       Intervention   Short Term Goals To learn and understand importance of monitoring SPO2 with pulse oximeter and demonstrate accurate use of the pulse oximeter.;To learn and demonstrate proper pursed lip breathing techniques or other breathing techniques. ;To learn and exhibit compliance with exercise, home and travel O2 prescription;To learn and understand importance of maintaining oxygen saturations>88%;To learn and demonstrate proper use of respiratory medications    Francis  Term Goals Exhibits compliance with exercise, home  and travel O2 prescription;Verbalizes importance of monitoring SPO2 with pulse oximeter and return demonstration;Maintenance of O2 saturations>88%;Exhibits proper breathing techniques, such as pursed lip breathing or other method taught during program session;Compliance with respiratory medication;Demonstrates proper use of MDI's             Oxygen Re-Evaluation:  Oxygen Re-Evaluation     Row Name 12/22/21 0813             Program Oxygen Prescription   Program Oxygen Prescription None         Home Oxygen   Home Oxygen Device None       Sleep Oxygen Prescription None       Home Exercise Oxygen Prescription None       Home Resting Oxygen Prescription None         Goals/Expected Outcomes   Short Term Goals To learn and understand importance of monitoring SPO2 with pulse oximeter and demonstrate accurate use of the pulse oximeter.;To learn and demonstrate proper pursed lip breathing techniques or other breathing techniques. ;To learn and exhibit compliance with exercise, home and travel O2 prescription;To learn and understand importance of maintaining oxygen saturations>88%;To learn and demonstrate proper use of respiratory medications       [place]  Term Goals Exhibits compliance with exercise, home  and travel O2 prescription;Verbalizes importance of monitoring SPO2 with pulse oximeter and return demonstration;Maintenance of O2 saturations>88%;Exhibits proper breathing  techniques, such as pursed lip breathing or other method taught during program session;Compliance with respiratory medication;Demonstrates proper use of MDI's       Goals/Expected Outcomes Compliance and understanding of oxygen  saturation monitoring and breathing techniques to decrease shortness of breath.                Oxygen Discharge (Final Oxygen Re-Evaluation):  Oxygen Re-Evaluation - 12/22/21 0813       Program Oxygen Prescription   Program Oxygen Prescription None      Home Oxygen   Home Oxygen Device None    Sleep Oxygen Prescription None    Home Exercise Oxygen Prescription None    Home Resting Oxygen Prescription None      Goals/Expected Outcomes   Short Term Goals To learn and understand importance of monitoring SPO2 with pulse oximeter and demonstrate accurate use of the pulse oximeter.;To learn and demonstrate proper pursed lip breathing techniques or other breathing techniques. ;To learn and exhibit compliance with exercise, home and travel O2 prescription;To learn and understand importance of maintaining oxygen saturations>88%;To learn and demonstrate proper use of respiratory medications    [place]  Term Goals Exhibits compliance with exercise, home  and travel O2 prescription;Verbalizes importance of monitoring SPO2 with pulse oximeter and return demonstration;Maintenance of O2 saturations>88%;Exhibits proper breathing techniques, such as pursed lip breathing or other method taught during program session;Compliance with respiratory medication;Demonstrates proper use of MDI's    Goals/Expected Outcomes Compliance and understanding of oxygen saturation monitoring and breathing techniques to decrease shortness of breath.             Initial Exercise Prescription:  Initial Exercise Prescription - 12/20/21 1200       Date of Initial Exercise RX and Referring Provider   Date 12/20/21    Referring Provider Wert    Expected Discharge Date 02/24/22      Recumbant  Elliptical   Level 1    Minutes 15    METs 2      Track   Minutes 15    METs 2      Prescription Details   Frequency (times per week) 2    Duration Progress to 30 minutes of continuous aerobic without signs/symptoms of physical distress      Intensity   THRR 40-80% of Max Heartrate 63-126    Ratings of Perceived Exertion 11-13    Perceived Dyspnea 0-4      Progression   Progression Continue to progress workloads to maintain intensity without signs/symptoms of physical distress.      Resistance Training   Training Prescription Yes    Weight blue bands    Reps 10-15             Perform Capillary Blood Glucose checks as needed.  Exercise Prescription Changes:   Exercise Comments:   Exercise Goals and Review:   Exercise Goals     Row Name 12/20/21 1102 12/22/21 0810           Exercise Goals   Increase Physical Activity Yes Yes      Intervention Provide advice, education, support and counseling about physical activity/exercise needs.;Develop an individualized exercise prescription for aerobic and resistive training based on initial evaluation findings, risk stratification, comorbidities and participant's personal goals. Provide advice, education, support and counseling about physical activity/exercise needs.;Develop an individualized exercise prescription for aerobic and resistive training based on initial evaluation findings, risk stratification, comorbidities and participant's personal goals.      Expected Outcomes Short Term: Attend rehab on a regular basis to increase amount of physical activity.;[place] Term: Add in home exercise to make exercise part of routine and to increase amount of physical activity.;[place] Term: Exercising regularly at least  3-5 days a week. Short Term: Attend rehab on a regular basis to increase amount of physical activity.;[place] Term: Add in home exercise to make exercise part of routine and to increase amount of physical activity.;Gelpi Term:  Exercising regularly at least 3-5 days a week.      Increase Strength and Stamina Yes Yes      Intervention Provide advice, education, support and counseling about physical activity/exercise needs.;Develop an individualized exercise prescription for aerobic and resistive training based on initial evaluation findings, risk stratification, comorbidities and participant's personal goals. Provide advice, education, support and counseling about physical activity/exercise needs.;Develop an individualized exercise prescription for aerobic and resistive training based on initial evaluation findings, risk stratification, comorbidities and participant's personal goals.      Expected Outcomes Short Term: Increase workloads from initial exercise prescription for resistance, speed, and METs.;Short Term: Perform resistance training exercises routinely during rehab and add in resistance training at home;Gehring Term: Improve cardiorespiratory fitness, muscular endurance and strength as measured by increased METs and functional capacity (6MWT) Short Term: Increase workloads from initial exercise prescription for resistance, speed, and METs.;Short Term: Perform resistance training exercises routinely during rehab and add in resistance training at home;Tostenson Term: Improve cardiorespiratory fitness, muscular endurance and strength as measured by increased METs and functional capacity (6MWT)      Able to understand and use rate of perceived exertion (RPE) scale Yes Yes      Intervention Provide education and explanation on how to use RPE scale Provide education and explanation on how to use RPE scale      Expected Outcomes Short Term: Able to use RPE daily in rehab to express subjective intensity level;Lanter Term:  Able to use RPE to guide intensity level when exercising independently Short Term: Able to use RPE daily in rehab to express subjective intensity level;Beth Term:  Able to use RPE to guide intensity level when exercising  independently      Able to understand and use Dyspnea scale Yes Yes      Intervention Provide education and explanation on how to use Dyspnea scale Provide education and explanation on how to use Dyspnea scale      Expected Outcomes Short Term: Able to use Dyspnea scale daily in rehab to express subjective sense of shortness of breath during exertion;Elias Term: Able to use Dyspnea scale to guide intensity level when exercising independently Short Term: Able to use Dyspnea scale daily in rehab to express subjective sense of shortness of breath during exertion;Bango Term: Able to use Dyspnea scale to guide intensity level when exercising independently      Knowledge and understanding of Target Heart Rate Range (THRR) Yes Yes      Intervention Provide education and explanation of THRR including how the numbers were predicted and where they are located for reference Provide education and explanation of THRR including how the numbers were predicted and where they are located for reference      Expected Outcomes Short Term: Able to state/look up THRR;Arney Term: Able to use THRR to govern intensity when exercising independently;Short Term: Able to use daily as guideline for intensity in rehab Short Term: Able to state/look up THRR;Goins Term: Able to use THRR to govern intensity when exercising independently;Short Term: Able to use daily as guideline for intensity in rehab      Understanding of Exercise Prescription Yes Yes      Intervention Provide education, explanation, and written materials on patient's individual exercise prescription Provide education, explanation, and written materials  on patient's individual exercise prescription      Expected Outcomes Short Term: Able to explain program exercise prescription;Wisener Term: Able to explain home exercise prescription to exercise independently Short Term: Able to explain program exercise prescription;[place] Term: Able to explain home exercise prescription to  exercise independently               Exercise Goals Re-Evaluation :  Exercise Goals Re-Evaluation     Row Name 12/22/21 0812             Exercise Goal Re-Evaluation   Exercise Goals Review Increase Physical Activity;Knowledge and understanding of Target Heart Rate Range (THRR);Increase Strength and Stamina;Able to understand and use rate of perceived exertion (RPE) scale;Understanding of Exercise Prescription;Able to understand and use Dyspnea scale       Comments Yanixan is scheduled to begin exercise next week. Will continue to monitor and progress as able.       Expected Outcomes Through exercise at rehab and home, the patient will decrease shortness of breath with daily activities and feel confident in carrying out an exercise regimen at home.                Discharge Exercise Prescription (Final Exercise Prescription Changes):   Nutrition:  Target Goals: Understanding of nutrition guidelines, daily intake of sodium <1527m, cholesterol <2020m calories 30% from fat and 7% or less from saturated fats, daily to have 5 or more servings of fruits and vegetables.  Biometrics:  Pre Biometrics - 12/20/21 1033       Pre Biometrics   Grip Strength 29 kg              Nutrition Therapy Plan and Nutrition Goals:   Nutrition Assessments:  MEDIFICTS Score Key: ?70 Need to make dietary changes  40-70 Heart Healthy Diet ? 40 Therapeutic Level Cholesterol Diet   Picture Your Plate Scores: <4<50nhealthy dietary pattern with much room for improvement. 41-50 Dietary pattern unlikely to meet recommendations for good health and room for improvement. 51-60 More healthful dietary pattern, with some room for improvement.  >60 Healthy dietary pattern, although there may be some specific behaviors that could be improved.    Nutrition Goals Re-Evaluation:   Nutrition Goals Discharge (Final Nutrition Goals Re-Evaluation):   Psychosocial: Target Goals: Acknowledge  presence or absence of significant depression and/or stress, maximize coping skills, provide positive support system. Participant is able to verbalize types and ability to use techniques and skills needed for reducing stress and depression.  Initial Review & Psychosocial Screening:  Initial Psych Review & Screening - 12/20/21 1053       Initial Review   Current issues with Current Anxiety/Panic;Current Psychotropic Meds    Comments on medication for anxiety      Family Dynamics   Good Support System? Yes    Comments spouse      Barriers   Psychosocial barriers to participate in program There are no identifiable barriers or psychosocial needs.      Screening Interventions   Interventions Encouraged to exercise             Quality of Life Scores:  Scores of 19 and below usually indicate a poorer quality of life in these areas.  A difference of  2-3 points is a clinically meaningful difference.  A difference of 2-3 points in the total score of the Quality of Life Index has been associated with significant improvement in overall quality of life, self-image, physical symptoms, and general health in studies  assessing change in quality of life.  PHQ-9: Review Flowsheet       12/20/2021 08/06/2020  Depression screen PHQ 2/9  Decreased Interest 0 0  Down, Depressed, Hopeless 0 0  PHQ - 2 Score 0 0  Altered sleeping 3 -  Tired, decreased energy 0 -  Change in appetite 0 -  Feeling bad or failure about yourself  0 -  Trouble concentrating 0 -  Moving slowly or fidgety/restless 0 -  Suicidal thoughts 0 -  PHQ-9 Score 3 -  Difficult doing work/chores Somewhat difficult -   Interpretation of Total Score  Total Score Depression Severity:  1-4 = Minimal depression, 5-9 = Mild depression, 10-14 = Moderate depression, 15-19 = Moderately severe depression, 20-27 = Severe depression   Psychosocial Evaluation and Intervention:  Psychosocial Evaluation - 12/20/21 1054        Psychosocial Evaluation & Interventions   Interventions Encouraged to exercise with the program and follow exercise prescription    Comments Rosalie feels that his anxiety medication is helping. Denies other psychosocial barriers.    Expected Outcomes For Elton to participate in PR free of psychosocial barriers    Continue Psychosocial Services  No Follow up required             Psychosocial Re-Evaluation:  Psychosocial Re-Evaluation     [place] Name 12/22/21 [phone]             Psychosocial Re-Evaluation   Current issues with Current Anxiety/Panic;Current Psychotropic Meds       Comments His anxiety is controlled by medication as he feels this is helping. Jerold denies psychosocial barriers to exercise.       Expected Outcomes For Werner to participate in PR free of psychosocial barriers For Alexa to participate in PR free of psychosocial barriers       Interventions Encouraged to attend Pulmonary Rehabilitation for the exercise       Continue Psychosocial Services  No Follow up required                Psychosocial Discharge (Final Psychosocial Re-Evaluation):  Psychosocial Re-Evaluation - 12/22/21 0814       Psychosocial Re-Evaluation   Current issues with Current Anxiety/Panic;Current Psychotropic Meds    Comments His anxiety is controlled by medication as he feels this is helping. Aksh denies psychosocial barriers to exercise.    Expected Outcomes For Kaesyn to participate in PR free of psychosocial barriers For Corliss to participate in PR free of psychosocial barriers    Interventions Encouraged to attend Pulmonary Rehabilitation for the exercise    Continue Psychosocial Services  No Follow up required             Education: Education Goals: Education classes will be provided on a weekly basis, covering required topics. Participant will state understanding/return demonstration of topics presented.  Learning Barriers/Preferences:  Learning Barriers/Preferences -  12/20/21 1057       Learning Barriers/Preferences   Learning Barriers Sight   wears glasses   Learning Preferences Skilled Demonstration;Verbal Instruction;Audio             Education Topics: Introduction to Pulmonary Rehab Group instruction provided by PowerPoint, verbal discussion, and written material to support subject matter. Instructor reviews what Pulmonary Rehab is, the purpose of the program, and how patients are referred.     Know Your Numbers Group instruction that is supported by a PowerPoint presentation. Instructor discusses importance of knowing and understanding resting, exercise, and post-exercise oxygen saturation, heart rate, and  blood pressure. Oxygen saturation, heart rate, blood pressure, rating of perceived exertion, and dyspnea are reviewed along with a normal range for these values.    Exercise for the Pulmonary Patient Group instruction that is supported by a PowerPoint presentation. Instructor discusses benefits of exercise, core components of exercise, frequency, duration, and intensity of an exercise routine, importance of utilizing pulse oximetry during exercise, safety while exercising, and options of places to exercise outside of rehab.       MET Level  Group instruction provided by PowerPoint, verbal discussion, and written material to support subject matter. Instructor reviews what METs are and how to increase METs.    Pulmonary Medications Verbally interactive group education provided by instructor with focus on inhaled medications and proper administration.   Anatomy and Physiology of the Respiratory System Group instruction provided by PowerPoint, verbal discussion, and written material to support subject matter. Instructor reviews respiratory cycle and anatomical components of the respiratory system and their functions. Instructor also reviews differences in obstructive and restrictive respiratory diseases with examples of each.    Oxygen  Safety Group instruction provided by PowerPoint, verbal discussion, and written material to support subject matter. There is an overview of "What is Oxygen" and "Why do we need it".  Instructor also reviews how to create a safe environment for oxygen use, the importance of using oxygen as prescribed, and the risks of noncompliance. There is a brief discussion on traveling with oxygen and resources the patient may utilize.   Oxygen Use Group instruction provided by PowerPoint, verbal discussion, and written material to discuss how supplemental oxygen is prescribed and different types of oxygen supply systems. Resources for more information are provided.    Breathing Techniques Group instruction that is supported by demonstration and informational handouts. Instructor discusses the benefits of pursed lip and diaphragmatic breathing and detailed demonstration on how to perform both.     Risk Factor Reduction Group instruction that is supported by a PowerPoint presentation. Instructor discusses the definition of a risk factor, different risk factors for pulmonary disease, and how the heart and lungs work together.   MD Day A group question and answer session with a medical doctor that allows participants to ask questions that relate to their pulmonary disease state.   Nutrition for the Pulmonary Patient Group instruction provided by PowerPoint slides, verbal discussion, and written materials to support subject matter. The instructor gives an explanation and review of healthy diet recommendations, which includes a discussion on weight management, recommendations for fruit and vegetable consumption, as well as protein, fluid, caffeine, fiber, sodium, sugar, and alcohol. Tips for eating when patients are short of breath are discussed.    Other Education Group or individual verbal, written, or video instructions that support the educational goals of the pulmonary rehab program.    Knowledge  Questionnaire Score:  Knowledge Questionnaire Score - 12/20/21 1222       Knowledge Questionnaire Score   Pre Score 14/18             Core Components/Risk Factors/Patient Goals at Admission:  Personal Goals and Risk Factors at Admission - 12/20/21 1058       Core Components/Risk Factors/Patient Goals on Admission   Improve shortness of breath with ADL's Yes    Intervention Provide education, individualized exercise plan and daily activity instruction to help decrease symptoms of SOB with activities of daily living.    Expected Outcomes Short Term: Improve cardiorespiratory fitness to achieve a reduction of symptoms when performing ADLs;[place] Term:  Be able to perform more ADLs without symptoms or delay the onset of symptoms             Core Components/Risk Factors/Patient Goals Review:   Goals and Risk Factor Review     Row Name 12/22/21 0815             Core Components/Risk Factors/Patient Goals Review   Personal Goals Review Improve shortness of breath with ADL's;Develop more efficient breathing techniques such as purse lipped breathing and diaphragmatic breathing and practicing self-pacing with activity.       Review Noel is scheduled to begin exercise next week.       Expected Outcomes See admission goals.                Core Components/Risk Factors/Patient Goals at Discharge (Final Review):   Goals and Risk Factor Review - 12/22/21 0815       Core Components/Risk Factors/Patient Goals Review   Personal Goals Review Improve shortness of breath with ADL's;Develop more efficient breathing techniques such as purse lipped breathing and diaphragmatic breathing and practicing self-pacing with activity.    Review Fenton is scheduled to begin exercise next week.    Expected Outcomes See admission goals.             ITP Comments:   Comments: Pt is scheduled to start exercise sessions next week. Recommend continued exercise, life style modification,  education, and utilization of breathing techniques to increase stamina and strength, while also decreasing shortness of breath with exertion.  Dr. Rodman Pickle is Medical Director for Pulmonary Rehab at [facility].

## 2021-12-31 ENCOUNTER — Other Ambulatory Visit (HOSPITAL_COMMUNITY): Payer: Self-pay

## 2022-01-02 NOTE — Progress Notes (Deleted)
Subjective:     Patient ID: Glenn [name], male   DOB: 12-04-1960     MRN: [id number]      Brief patient profile:  68 yobm quit smoking 09/2020   with onset of symptoms in 1995 while in [place] dx as asthma vs sarcoid and rx pred/ theoph / albuterol seen by allergist dx as pollen moved to [place] where underwent bilateral lung surgery 2011 ? Lung vol reduction ? Helped breathing at the time but uses [facility] parking and  [facility] = can't walk a nl pace on a flat grade s sob but does fine slow and flat so referred to pulmonary clinic 10/05/2016 by Dr  Jackson Latino      History of Present Illness  10/05/2016 1st [facility] office visit/ Glenn [name]   Chief Complaint  Patient presents with   Pulmonary Consult    Referred by Dr. Haskel Schroeder for eval of Asthma and COPD. Pt states he was dxed with COPD and Asthma in 1995. He moved here a year ago from [place], [place]. He states his breathing is "okay" today. He states he has a "slight cold"- prod cough with minimal brown sputum.    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  Work on Orthoptist B = Backup Only use your albuterol (proair) as a rescue medication Plan C = Crisis - only use your albuterol nebulizer if you first try Plan B and it fails to help > ok to use the nebulizer up to every 4 hours but if start needing it regularly call for immediate appointment Please schedule a follow up office visit in 6 weeks, call sooner if needed with pfts    01/15/2020  f/u ov/Glenn [name] re:  Copd II / still  smoking / no really on maint symb/ doesn't remember rx with spiriva previously  Chief Complaint  Patient presents with   Follow-up    Breathing is overall doing well. He states that he uses his albuterol inhaler about every 4 hours and uses neb every night.   Dyspnea:  MMRC2 = can't walk a nl pace on a flat grade s sob but does fine slow and flat  Cough: none  Sleeping: 3 h p lie down needs nebulizer  SABA use: way too much /totally confused on maint vs prns 02: none  Prednisone always helps and when it does he tends to stop everything not understanding the difference between maint and prns Rec Plan A = Automatic = Always=    symbicort 160 Take 2 puffs first thing in am and then another 2 puffs about 12 hours later and spiriva 2 pffs each am  Prednisone 10 mg take  4 each am x 2 days,   2 each am x 2 days,  1 each am x 2 days and stop   Plan B = Backup (to supplement plan A,  not to replace it) Only use your albuterol inhaler as a rescue medication Plan C = Crisis (instead of Plan B but only if Plan B stops working) - only use your albuterol nebulizer if you first try Plan B and it fails to help > ok to use the nebulizer up to every 4 hours but if start needing it regularly call for immediate appointment The key is to stop smoking completely before smoking completely stops you! I very strongly recommend you get the moderna or pfizer vaccine  If you are satisfied with your treatment plan,  let your doctor know and he/she can either refill your medications or you can return here when your prescription runs out but you must bring all active medications with you if you want my help.  NP recs  06/09/20   HST/pfts       08/16/2021  f/u ov/Glenn [name] re: GOLD  2  maint on symb/spiriva  and lots of saba p exertion (not the instruction given)  Chief Complaint  Patient presents with   Follow-up    Pt states his breathing is good but he has a  sore throat.   Dyspnea: MMRC2 = can't walk a nl pace on a  flat grade s sob but does fine slow and flat   Cough: minimal dry  Sleeping: 30 degrees electric bed  SABA use: none / nebulizer  once or twice daily never prechallenges or rechallenges  02: none  Covid status:  never vax  Prednisone always helps breathing transiently/ interested in pulm rehab  Rec  Prednisone 10 mg take  4 each am x 2 days,   2 each am x 2 days,  1 each am x 2 days and stop  Plan A = Automatic = Always=    symbicort 160  Take 2 puffs first thing in am and then another 2 puffs about 12 hours later. Spiriva is 2 pffs after the symbicort Work on inhaler technique:   Plan B = Backup (to supplement plan A, not to replace it) Only use your albuterol inhaler as a rescue medication  Plan C = Crisis (instead of Plan B but only if Plan B stops working) - only use your albuterol nebulizer if you first try Plan B  Ok to try albuterol 15 min before an activity (on alternating days remembering the nebulizer is 8 x stronger)  that you know would usually make you short of breath  My office will be contacting you by phone for referral to pulmonary rehab > did not happen   Admit 09/29/21 and 11/17/21  and tussionex made the difference   11/22/2021  f/u ov/Glenn [name] re: GOLD 2   maint on symbicort/ spiriva / new globus sensation worse off tussionex  Chief Complaint  Patient presents with   Follow-up    Inpatient at [facility]. Wheezing.  D/C'd 11/19/21  Dyspnea:  MMRC2 = can't walk a nl pace on a flat grade s sob but does fine slow and flat   Cough: sense of st/ globus / using mints and prn pm ppi  Sleeping: sleep 30 degrees SABA use: none on day of ov/ needed neb 11/21/21 p carrying heavy object  02: none  Covid status:   never  Lung cancer screening :  in program   Rec Plan A = Automatic = Always=    Symbiocrt 160 Take 2 puffs first thing in am and then another 2 puffs about 12 hours later and spiriva 2 puffs in am Practice with  spiriva to get a much deeper breath for all your devices   Pantoprazole (protonix) 40 mg   Take  30-60 min before first meal of the day and Pepcid (famotidine)  20 mg after supper until return to office Jolley ranchers were great - don't use mint / menthol or chocalate  Plan B = Backup (to supplement plan A, not to replace it) Only use your albuterol inhaler as a rescue medication  Plan C = Crisis (instead of Plan B but only if Plan B stops working) - only use your albuterol nebulizer if you first try Plan B  Ok to try albuterol 15 min before an activity (on alternating days)  that you know would usually make you short of breath My office will be contacting you by phone for referral to ENT and pulmonary rehab   Please schedule a follow up office visit in 6 weeks, call sooner if needed with all medications /inhalers/ solutions in hand   01/03/2022  f/u ov/Glenn [name] re: ***   maint on ***  No chief complaint on file.   Dyspnea:  *** Cough: *** Sleeping: *** SABA use: *** 02: *** Covid status:   *** Lung cancer screening :  ***    No obvious day to day or daytime variability or assoc excess/ purulent sputum or mucus plugs or hemoptysis or cp or chest tightness, subjective wheeze or overt sinus or hb symptoms.   *** without nocturnal  or early am exacerbation  of respiratory  c/o's or need for noct saba. Also denies any obvious fluctuation of symptoms with weather or environmental changes or other aggravating or alleviating factors except as outlined above   No unusual exposure hx or h/o childhood pna/ asthma or knowledge of premature birth.  Current Allergies, Complete Past Medical History, Past Surgical History, Family History, and Social History were reviewed in [employer] record.  ROS  The following are not active complaints unless bolded Hoarseness, sore throat, dysphagia, dental problems, itching, sneezing,  nasal congestion or discharge of excess mucus or purulent secretions, ear ache,   fever, chills, sweats,  unintended wt loss or wt gain, classically pleuritic or exertional cp,  orthopnea pnd or arm/hand swelling  or leg swelling, presyncope, palpitations, abdominal pain, anorexia, nausea, vomiting, diarrhea  or change in bowel habits or change in bladder habits, change in stools or change in urine, dysuria, hematuria,  rash, arthralgias, visual complaints, headache, numbness, weakness or ataxia or problems with walking or coordination,  change in mood or  memory.        No outpatient medications have been marked as taking for the 01/03/22 encounter (Appointment) with [name], MD.                       Objective:   Physical Exam   Wts  01/03/2022       *** 11/22/2021      146   08/16/2021        149  11/11/2020        147  08/11/2020          144 01/15/2020        140 03/26/2019        144  11/20/2018      144  08/14/2018          144  02/08/2018          143  11/22/17 142 lb (64.4 kg)  10/05/16 131 lb 12.8 oz (  59.8 kg)    Vital signs reviewed  01/03/2022  - Note at rest 02 sats  ***% on ***   General appearance:    ***      Mod bar***      Assessment:

## 2022-01-03 ENCOUNTER — Ambulatory Visit: Payer: Medicaid Other | Admitting: Internal Medicine

## 2022-01-04 ENCOUNTER — Ambulatory Visit (HOSPITAL_COMMUNITY): Payer: Medicaid Other

## 2022-01-04 ENCOUNTER — Telehealth (HOSPITAL_COMMUNITY): Payer: Self-pay | Admitting: *Deleted

## 2022-01-04 ENCOUNTER — Other Ambulatory Visit (HOSPITAL_COMMUNITY): Payer: Self-pay

## 2022-01-04 MED ORDER — LORATADINE 10 MG PO TABS
10.0000 mg | ORAL_TABLET | Freq: Every day | ORAL | 5 refills | Status: AC
  Filled 2022-01-04: qty 30, 30d supply, fill #0

## 2022-01-04 NOTE — Telephone Encounter (Signed)
Called Glenn [name] to find out why he has not attended the PR program. HE states that he has sciatica in his back and leg. He states that it started two days ago and that he has had it before. It usually last  2-7 days. He said that it does go away by itself. He is on pain medication that he takes daily. I encouraged ice/heat, topical medication, his pain medications,to call his PCP if this does not improve. I did encourage him to call us if he needs any assistance with seeking out medical help.He voiced understanding.

## 2022-01-06 ENCOUNTER — Ambulatory Visit (HOSPITAL_COMMUNITY): Payer: Medicaid Other

## 2022-01-10 ENCOUNTER — Emergency Department (HOSPITAL_COMMUNITY): Payer: Medicaid Other

## 2022-01-10 ENCOUNTER — Other Ambulatory Visit: Payer: Self-pay

## 2022-01-10 ENCOUNTER — Encounter (HOSPITAL_COMMUNITY): Payer: Self-pay | Admitting: Internal Medicine

## 2022-01-10 ENCOUNTER — Inpatient Hospital Stay (HOSPITAL_COMMUNITY)
Admission: EM | Admit: 2022-01-10 | Discharge: 2022-01-13 | DRG: 193 | Disposition: A | Discharge status: Home / Self Care | Payer: Medicaid Other | Attending: Internal Medicine | Admitting: Internal Medicine

## 2022-01-10 DIAGNOSIS — F32A Depression, unspecified: Secondary | ICD-10-CM | POA: Diagnosis present

## 2022-01-10 DIAGNOSIS — Z888 Allergy status to other drugs, medicaments and biological substances status: Secondary | ICD-10-CM | POA: Diagnosis not present

## 2022-01-10 DIAGNOSIS — J101 Influenza due to other identified influenza virus with other respiratory manifestations: Secondary | ICD-10-CM | POA: Diagnosis present

## 2022-01-10 DIAGNOSIS — Z7951 Long term (current) use of inhaled steroids: Secondary | ICD-10-CM

## 2022-01-10 DIAGNOSIS — Z87891 Personal history of nicotine dependence: Secondary | ICD-10-CM | POA: Diagnosis not present

## 2022-01-10 DIAGNOSIS — N400 Enlarged prostate without lower urinary tract symptoms: Secondary | ICD-10-CM | POA: Diagnosis present

## 2022-01-10 DIAGNOSIS — Z79891 Long term (current) use of opiate analgesic: Secondary | ICD-10-CM

## 2022-01-10 DIAGNOSIS — D869 Sarcoidosis, unspecified: Secondary | ICD-10-CM | POA: Diagnosis present

## 2022-01-10 DIAGNOSIS — Z833 Family history of diabetes mellitus: Secondary | ICD-10-CM

## 2022-01-10 DIAGNOSIS — Z79899 Other long term (current) drug therapy: Secondary | ICD-10-CM | POA: Diagnosis not present

## 2022-01-10 DIAGNOSIS — I10 Essential (primary) hypertension: Secondary | ICD-10-CM | POA: Diagnosis present

## 2022-01-10 DIAGNOSIS — Z7902 Long term (current) use of antithrombotics/antiplatelets: Secondary | ICD-10-CM

## 2022-01-10 DIAGNOSIS — E785 Hyperlipidemia, unspecified: Secondary | ICD-10-CM | POA: Diagnosis present

## 2022-01-10 DIAGNOSIS — Z91013 Allergy to seafood: Secondary | ICD-10-CM | POA: Diagnosis not present

## 2022-01-10 DIAGNOSIS — G894 Chronic pain syndrome: Secondary | ICD-10-CM | POA: Diagnosis present

## 2022-01-10 DIAGNOSIS — E119 Type 2 diabetes mellitus without complications: Secondary | ICD-10-CM | POA: Diagnosis present

## 2022-01-10 DIAGNOSIS — G8929 Other chronic pain: Secondary | ICD-10-CM | POA: Diagnosis present

## 2022-01-10 DIAGNOSIS — I152 Hypertension secondary to endocrine disorders: Secondary | ICD-10-CM | POA: Diagnosis present

## 2022-01-10 DIAGNOSIS — Z8249 Family history of ischemic heart disease and other diseases of the circulatory system: Secondary | ICD-10-CM

## 2022-01-10 DIAGNOSIS — Z91014 Allergy to mammalian meats: Secondary | ICD-10-CM

## 2022-01-10 DIAGNOSIS — J189 Pneumonia, unspecified organism: Secondary | ICD-10-CM | POA: Diagnosis present

## 2022-01-10 DIAGNOSIS — M1990 Unspecified osteoarthritis, unspecified site: Secondary | ICD-10-CM | POA: Diagnosis present

## 2022-01-10 DIAGNOSIS — Z7984 Long term (current) use of oral hypoglycemic drugs: Secondary | ICD-10-CM

## 2022-01-10 DIAGNOSIS — Z20822 Contact with and (suspected) exposure to covid-19: Secondary | ICD-10-CM | POA: Diagnosis present

## 2022-01-10 DIAGNOSIS — J9601 Acute respiratory failure with hypoxia: Secondary | ICD-10-CM

## 2022-01-10 DIAGNOSIS — Z8673 Personal history of transient ischemic attack (TIA), and cerebral infarction without residual deficits: Secondary | ICD-10-CM | POA: Diagnosis not present

## 2022-01-10 DIAGNOSIS — J441 Chronic obstructive pulmonary disease with (acute) exacerbation: Secondary | ICD-10-CM | POA: Diagnosis present

## 2022-01-10 DIAGNOSIS — N529 Male erectile dysfunction, unspecified: Secondary | ICD-10-CM | POA: Diagnosis present

## 2022-01-10 DIAGNOSIS — J1000 Influenza due to other identified influenza virus with unspecified type of pneumonia: Secondary | ICD-10-CM | POA: Diagnosis present

## 2022-01-10 DIAGNOSIS — Z886 Allergy status to analgesic agent status: Secondary | ICD-10-CM

## 2022-01-10 DIAGNOSIS — Z20828 Contact with and (suspected) exposure to other viral communicable diseases: Secondary | ICD-10-CM | POA: Diagnosis present

## 2022-01-10 DIAGNOSIS — Z902 Acquired absence of lung [part of]: Secondary | ICD-10-CM | POA: Diagnosis not present

## 2022-01-10 DIAGNOSIS — F419 Anxiety disorder, unspecified: Secondary | ICD-10-CM | POA: Diagnosis present

## 2022-01-10 DIAGNOSIS — J440 Chronic obstructive pulmonary disease with acute lower respiratory infection: Secondary | ICD-10-CM | POA: Diagnosis present

## 2022-01-10 DIAGNOSIS — E1169 Type 2 diabetes mellitus with other specified complication: Secondary | ICD-10-CM | POA: Diagnosis present

## 2022-01-10 DIAGNOSIS — J129 Viral pneumonia, unspecified: Secondary | ICD-10-CM | POA: Diagnosis present

## 2022-01-10 LAB — CBC WITH DIFFERENTIAL/PLATELET
Abs Immature Granulocytes: 0.03 10*3/uL (ref 0.00–0.07)
Basophils Absolute: 0 10*3/uL (ref 0.0–0.1)
Basophils Relative: 0 %
Eosinophils Absolute: 0.2 10*3/uL (ref 0.0–0.5)
Eosinophils Relative: 2 %
HCT: 43.1 % (ref 39.0–52.0)
Hemoglobin: 13.8 g/dL (ref 13.0–17.0)
Immature Granulocytes: 0 %
Lymphocytes Relative: 15 %
Lymphs Abs: 1.2 10*3/uL (ref 0.7–4.0)
MCH: 28.9 pg (ref 26.0–34.0)
MCHC: 32 g/dL (ref 30.0–36.0)
MCV: 90.2 fL (ref 80.0–100.0)
Monocytes Absolute: 1.9 10*3/uL — ABNORMAL HIGH (ref 0.1–1.0)
Monocytes Relative: 24 %
Neutro Abs: 4.7 10*3/uL (ref 1.7–7.7)
Neutrophils Relative %: 59 %
Platelets: 247 10*3/uL (ref 150–400)
RBC: 4.78 MIL/uL (ref 4.22–5.81)
RDW: 13.5 % (ref 11.5–15.5)
WBC: 7.9 10*3/uL (ref 4.0–10.5)
nRBC: 0 % (ref 0.0–0.2)

## 2022-01-10 LAB — PROTIME-INR
INR: 1 (ref 0.8–1.2)
Prothrombin Time: 13.4 seconds (ref 11.4–15.2)

## 2022-01-10 LAB — COMPREHENSIVE METABOLIC PANEL
ALT: 20 U/L (ref 0–44)
AST: 43 U/L — ABNORMAL HIGH (ref 15–41)
Albumin: 4.2 g/dL (ref 3.5–5.0)
Alkaline Phosphatase: 64 U/L (ref 38–126)
Anion gap: 11 (ref 5–15)
BUN: 15 mg/dL (ref 8–23)
CO2: 28 mmol/L (ref 22–32)
Calcium: 8.9 mg/dL (ref 8.9–10.3)
Chloride: 101 mmol/L (ref 98–111)
Creatinine, Ser: 0.78 mg/dL (ref 0.61–1.24)
GFR, Estimated: >60 mL/min (ref >60)
Glucose, Bld: 96 mg/dL (ref 70–99)
Potassium: 4 mmol/L (ref 3.5–5.1)
Sodium: 140 mmol/L (ref 135–145)
Total Bilirubin: 0.6 mg/dL (ref 0.3–1.2)
Total Protein: 7.7 g/dL (ref 6.5–8.1)

## 2022-01-10 LAB — LACTIC ACID, PLASMA: Lactic Acid, Venous: 1.4 mmol/L (ref 0.5–1.9)

## 2022-01-10 LAB — RESP PANEL BY RT-PCR (FLU A&B, COVID) ARPGX2
Influenza A by PCR: POSITIVE — AB
Influenza B by PCR: NEGATIVE
SARS Coronavirus 2 by RT PCR: NEGATIVE

## 2022-01-10 LAB — BASIC METABOLIC PANEL
Anion gap: 8 (ref 5–15)
BUN: 14 mg/dL (ref 8–23)
CO2: 29 mmol/L (ref 22–32)
Calcium: 8.9 mg/dL (ref 8.9–10.3)
Chloride: 102 mmol/L (ref 98–111)
Creatinine, Ser: 0.92 mg/dL (ref 0.61–1.24)
GFR, Estimated: >60 mL/min (ref >60)
Glucose, Bld: 158 mg/dL — ABNORMAL HIGH (ref 70–99)
Potassium: 4.1 mmol/L (ref 3.5–5.1)
Sodium: 139 mmol/L (ref 135–145)

## 2022-01-10 LAB — PROCALCITONIN: Procalcitonin: <0.1 ng/mL

## 2022-01-10 LAB — D-DIMER, QUANTITATIVE: D-Dimer, Quant: 0.58 ug/mL-FEU — ABNORMAL HIGH (ref 0.00–0.50)

## 2022-01-10 MED ORDER — TAMSULOSIN HCL 0.4 MG PO CAPS
0.4000 mg | ORAL_CAPSULE | Freq: Every day | ORAL | Status: DC
  Administered 2022-01-11: 0.4 mg via ORAL
  Filled 2022-01-10 (×3): qty 1

## 2022-01-10 MED ORDER — SODIUM CHLORIDE 0.9 % IV SOLN
1.0000 g | INTRAVENOUS | Status: DC
  Administered 2022-01-11: 1 g via INTRAVENOUS
  Filled 2022-01-10 (×2): qty 10

## 2022-01-10 MED ORDER — METFORMIN HCL 500 MG PO TABS
500.0000 mg | ORAL_TABLET | Freq: Two times a day (BID) | ORAL | Status: DC
  Administered 2022-01-11 – 2022-01-13 (×5): 500 mg via ORAL
  Filled 2022-01-10 (×5): qty 1

## 2022-01-10 MED ORDER — ATORVASTATIN CALCIUM 40 MG PO TABS
40.0000 mg | ORAL_TABLET | Freq: Every day | ORAL | Status: DC
  Administered 2022-01-11: 40 mg via ORAL
  Filled 2022-01-10 (×3): qty 1

## 2022-01-10 MED ORDER — IPRATROPIUM BROMIDE 0.02 % IN SOLN
0.5000 mg | Freq: Once | RESPIRATORY_TRACT | Status: AC
  Administered 2022-01-10: 0.5 mg via RESPIRATORY_TRACT
  Filled 2022-01-10: qty 2.5

## 2022-01-10 MED ORDER — PANTOPRAZOLE SODIUM 40 MG PO TBEC
40.0000 mg | DELAYED_RELEASE_TABLET | Freq: Every day | ORAL | Status: DC
  Administered 2022-01-10 – 2022-01-13 (×4): 40 mg via ORAL
  Filled 2022-01-10 (×4): qty 1

## 2022-01-10 MED ORDER — MAGNESIUM SULFATE 2 GM/50ML IV SOLN
2.0000 g | Freq: Once | INTRAVENOUS | Status: AC
  Administered 2022-01-10: 2 g via INTRAVENOUS
  Filled 2022-01-10: qty 50

## 2022-01-10 MED ORDER — BENZONATATE 100 MG PO CAPS
200.0000 mg | ORAL_CAPSULE | Freq: Three times a day (TID) | ORAL | Status: DC | PRN
  Administered 2022-01-11 (×2): 200 mg via ORAL
  Filled 2022-01-10 (×2): qty 2

## 2022-01-10 MED ORDER — MOMETASONE FURO-FORMOTEROL FUM 200-5 MCG/ACT IN AERO
2.0000 | INHALATION_SPRAY | Freq: Two times a day (BID) | RESPIRATORY_TRACT | Status: DC
  Administered 2022-01-11 – 2022-01-13 (×5): 2 via RESPIRATORY_TRACT
  Filled 2022-01-10: qty 8.8

## 2022-01-10 MED ORDER — SODIUM CHLORIDE 0.9 % IV SOLN
500.0000 mg | Freq: Once | INTRAVENOUS | Status: AC
  Administered 2022-01-10: 500 mg via INTRAVENOUS
  Filled 2022-01-10: qty 5

## 2022-01-10 MED ORDER — NITROGLYCERIN 0.4 MG SL SUBL: 0.4000 mg | SUBLINGUAL_TABLET | SUBLINGUAL | Status: DC | PRN

## 2022-01-10 MED ORDER — FAMOTIDINE 20 MG PO TABS
20.0000 mg | ORAL_TABLET | Freq: Every day | ORAL | Status: DC
  Administered 2022-01-10 – 2022-01-11 (×2): 20 mg via ORAL
  Filled 2022-01-10 (×3): qty 1

## 2022-01-10 MED ORDER — ONDANSETRON HCL 4 MG PO TABS: 4.0000 mg | ORAL_TABLET | Freq: Four times a day (QID) | ORAL | Status: DC | PRN

## 2022-01-10 MED ORDER — MONTELUKAST SODIUM 10 MG PO TABS: 10.0000 mg | ORAL_TABLET | Freq: Every day | ORAL | Status: DC | PRN

## 2022-01-10 MED ORDER — GABAPENTIN 300 MG PO CAPS
600.0000 mg | ORAL_CAPSULE | Freq: Every day | ORAL | Status: DC
  Administered 2022-01-11 – 2022-01-13 (×3): 600 mg via ORAL
  Filled 2022-01-10 (×3): qty 2

## 2022-01-10 MED ORDER — PREDNISONE 20 MG PO TABS: 40.0000 mg | ORAL_TABLET | Freq: Every day | ORAL | Status: DC

## 2022-01-10 MED ORDER — ONDANSETRON HCL 4 MG/2ML IJ SOLN: 4.0000 mg | Freq: Four times a day (QID) | INTRAMUSCULAR | Status: DC | PRN

## 2022-01-10 MED ORDER — BENZONATATE 100 MG PO CAPS
100.0000 mg | ORAL_CAPSULE | Freq: Once | ORAL | Status: AC
  Administered 2022-01-10: 100 mg via ORAL
  Filled 2022-01-10: qty 1

## 2022-01-10 MED ORDER — HYDROCOD POLI-CHLORPHE POLI ER 10-8 MG/5ML PO SUER
5.0000 mL | Freq: Two times a day (BID) | ORAL | Status: DC | PRN
  Administered 2022-01-11 – 2022-01-12 (×3): 5 mL via ORAL
  Filled 2022-01-10 (×3): qty 5

## 2022-01-10 MED ORDER — METHYLPREDNISOLONE SODIUM SUCC 125 MG IJ SOLR
125.0000 mg | Freq: Once | INTRAMUSCULAR | Status: AC
  Administered 2022-01-10: 125 mg via INTRAVENOUS
  Filled 2022-01-10: qty 2

## 2022-01-10 MED ORDER — LOSARTAN POTASSIUM 50 MG PO TABS
100.0000 mg | ORAL_TABLET | Freq: Every day | ORAL | Status: DC
  Administered 2022-01-11 – 2022-01-13 (×3): 100 mg via ORAL
  Filled 2022-01-10: qty 4
  Filled 2022-01-10: qty 2
  Filled 2022-01-10: qty 4
  Filled 2022-01-10: qty 2

## 2022-01-10 MED ORDER — OSELTAMIVIR PHOSPHATE 75 MG PO CAPS
75.0000 mg | ORAL_CAPSULE | Freq: Two times a day (BID) | ORAL | Status: DC
  Administered 2022-01-10 – 2022-01-13 (×6): 75 mg via ORAL
  Filled 2022-01-10 (×7): qty 1

## 2022-01-10 MED ORDER — SODIUM CHLORIDE 0.9 % IV SOLN
500.0000 mg | INTRAVENOUS | Status: DC
  Administered 2022-01-11: 500 mg via INTRAVENOUS
  Filled 2022-01-10 (×2): qty 5

## 2022-01-10 MED ORDER — HYDROCHLOROTHIAZIDE 12.5 MG PO TABS
12.5000 mg | ORAL_TABLET | Freq: Every day | ORAL | Status: DC
  Administered 2022-01-10 – 2022-01-13 (×4): 12.5 mg via ORAL
  Filled 2022-01-10 (×4): qty 1

## 2022-01-10 MED ORDER — LORATADINE 10 MG PO TABS
10.0000 mg | ORAL_TABLET | Freq: Every day | ORAL | Status: DC
  Administered 2022-01-11: 10 mg via ORAL
  Filled 2022-01-10 (×2): qty 1

## 2022-01-10 MED ORDER — CLOPIDOGREL BISULFATE 75 MG PO TABS
75.0000 mg | ORAL_TABLET | Freq: Every day | ORAL | Status: DC
  Administered 2022-01-10 – 2022-01-13 (×4): 75 mg via ORAL
  Filled 2022-01-10 (×4): qty 1

## 2022-01-10 MED ORDER — ALBUTEROL SULFATE (2.5 MG/3ML) 0.083% IN NEBU
2.5000 mg | INHALATION_SOLUTION | Freq: Once | RESPIRATORY_TRACT | Status: AC
  Administered 2022-01-10: 2.5 mg via RESPIRATORY_TRACT
  Filled 2022-01-10: qty 3

## 2022-01-10 MED ORDER — OXYCODONE HCL 5 MG PO TABS
20.0000 mg | ORAL_TABLET | Freq: Four times a day (QID) | ORAL | Status: DC | PRN
  Administered 2022-01-10 – 2022-01-13 (×10): 20 mg via ORAL
  Filled 2022-01-10 (×10): qty 4

## 2022-01-10 MED ORDER — LOSARTAN POTASSIUM-HCTZ 100-12.5 MG PO TABS: 1.0000 | ORAL_TABLET | Freq: Every day | ORAL | Status: DC

## 2022-01-10 MED ORDER — SODIUM CHLORIDE 0.9 % IV SOLN
1.0000 g | Freq: Once | INTRAVENOUS | Status: AC
  Administered 2022-01-10: 1 g via INTRAVENOUS
  Filled 2022-01-10: qty 10

## 2022-01-10 NOTE — ED Provider Triage Note (Signed)
Emergency Medicine Provider Triage Evaluation Note  Glenn [name] , a 61 y.o. male  was evaluated in triage.  Pt complains of SOB.  Started 2 weeks ago.  Patient has COPD.  Shortness of breath is worse with lying down and exertion.  Also endorses cough.  Cough is productive with brown sputum.  There was concerned there may be blood in the sputum this morning.  Now endorsing chest pain which started last couple days.  Worse with coughing.  Located in the center of his chest nonradiating..  Patient states he believes all his symptoms are related to his asthma and COPD.  He has been conducting 6-10 breathing treatments at home per day with albuterol which is helped somewhat.  Review of Systems  Positive: See above Negative: See above  Physical Exam  BP (!) 140/106 (BP Location: Left Arm)   Pulse (!) 124   Temp 98.9 F (37.2 C) (Oral)   Resp (!) 22   SpO2 91%  Gen:   Awake, no distress   Resp:  Labored, tachypneic, expiratory wheezing in the upper lung fields.  Diminished breath sounds left greater than right. MSK:   Moves extremities without difficulty  Other:    Medical Decision Making  Medically screening exam initiated at 12:07 PM.  Appropriate orders placed.  Monique Gift Correll was informed that the remainder of the evaluation will be completed by another provider, this initial triage assessment does not replace that evaluation, and the importance of remaining in the ED until their evaluation is complete.  Work up initiated    Gareth Eagle, PA-C 01/10/22 1210

## 2022-01-10 NOTE — ED Provider Triage Note (Signed)
Emergency Medicine Provider Triage Evaluation Note  FEDERICO MAIORINO , a 61 y.o. male  was evaluated in triage.  Pt complains of sob. Productive cough and streaks of blood x 2 weeks.  Increased sob, chills and not feeling well.  Hx COPD. No hx of PE or CHF  Review of Systems  Positive: As above Negative: As above  Physical Exam  BP (!) 140/106 (BP Location: Left Arm)   Pulse (!) 124   Temp 98.9 F (37.2 C) (Oral)   Resp (!) 22   SpO2 91%  Gen:   Awake, no distress   Resp:  Normal effort  MSK:   Moves extremities without difficulty  Other:    Medical Decision Making  Medically screening exam initiated at 12:13 PM.  Appropriate orders placed.  Jada Kuhnert Cheaney was informed that the remainder of the evaluation will be completed by another provider, this initial triage assessment does not replace that evaluation, and the importance of remaining in the ED until their evaluation is complete.  Pt has O2 sats in the 70s, supplemental O2 given.  Request nurse to move back to next available room.    Fayrene Helper, PA-C 01/10/22 1216

## 2022-01-10 NOTE — ED Provider Notes (Signed)
[facility]-EMERGENCY DEPT Provider Note   CSN: [id number] Arrival date & time: 01/10/22  1108     History  Chief Complaint  Patient presents with   Shortness of Breath   HPI Glenn [name] is a 61 y.o. male with COPD, sarcoidosis, history of CVA, DM II, hyperlipidemia and hypertension presenting for shortness of breath. Started 2 weeks ago.  Patient has COPD.  Shortness of breath is worse with lying down and exertion.  Also endorses cough.  Cough is productive with brown sputum.  There was concerned there may be blood in the sputum this morning.  Now endorsing chest pain which started last couple days.  Worse with coughing.  Located in the center of his chest nonradiating..  Patient states he believes all his symptoms are related to his asthma and COPD.  He has been conducting 6-10 breathing treatments at home per day with albuterol which is helped somewhat.  On arrival to the ED patient's O2 sats were 70.  Placed on 3 L nasal cannula sats improved to mid to high 90s.  Patient also mentioned that just before he got here he did an albuterol treatment at home.  Denies fever.   Shortness of Breath      Home Medications Prior to Admission medications   Medication Sig Start Date End Date Taking? Authorizing Provider  albuterol (PROAIR HFA) 108 (90 Base) MCG/ACT inhaler 2 puffs every 4 hours as needed only  if your can't catch your breath Patient taking differently: Inhale 2 puffs into the lungs 6 (six) times daily. 11/11/20   [name], MD  albuterol (PROVENTIL) (2.5 MG/3ML) 0.083% nebulizer solution Take 3 mLs (2.5 mg total) by nebulization every 4 (four) hours as needed for wheezing or shortness of breath. 11/19/21   Ghimire, Werner Lean, MD  AMITIZA 24 MCG capsule Take 24 mcg by mouth daily as needed for constipation. 03/31/20   [provider]  amLODipine (NORVASC) 10 MG tablet Take 1 tablet (10 mg total) by mouth daily. 10/03/21   Rai, Delene Ruffini, MD   Ascorbic Acid (VITAMIN C PO) Take 2 tablets by mouth daily.    [provider]  atorvastatin (LIPITOR) 40 MG tablet Take 1 tablet (40 mg total) by mouth daily. 06/26/20   Pokhrel, Rebekah Chesterfield, MD  azithromycin (ZITHROMAX) 500 MG tablet Take 1 tablet (500 mg total) by mouth daily. 11/19/21   Ghimire, Werner Lean, MD  benzonatate (TESSALON) 200 MG capsule Take 1 capsule (200 mg total) by mouth 3 (three) times daily as needed for cough. 11/19/21   Ghimire, Werner Lean, MD  budesonide-formoterol (SYMBICORT) 160-4.5 MCG/ACT inhaler Inhale 2 puffs into the lungs 2 (two) times daily. 07/23/21   [name], MD  chlorpheniramine-HYDROcodone (TUSSIONEX) 10-8 MG/5ML Take 5 mLs by mouth every 12 (twelve) hours as needed (intractable cough). 11/19/21   Ghimire, Werner Lean, MD  clopidogrel (PLAVIX) 75 MG tablet Take 75 mg by mouth daily. 07/06/21   [provider]  famotidine (PEPCID) 20 MG tablet One after supper 11/22/21   [name], MD  fluticasone [facility]) 50 MCG/ACT nasal spray Place 2 sprays into both nostrils daily. 11/19/21   Ghimire, Werner Lean, MD  gabapentin (NEURONTIN) 300 MG capsule Take 1 capsule (300 mg total) by mouth 3 (three) times daily. Patient taking differently: Take 600 mg by mouth daily. 02/09/18   Eustace Moore, MD  loratadine (CLARITIN) 10 MG tablet Take 1 tablet (10 mg total) by mouth daily. 01/03/22  losartan-hydrochlorothiazide (HYZAAR) 100-12.5 MG tablet Take 1 tablet by mouth daily. 09/03/21   [provider]  metFORMIN (GLUCOPHAGE) 500 MG tablet Take 1 tablet (500 mg total) by mouth 2 (two) times daily with a meal. 10/03/21   Rai, Ripudeep K, MD  montelukast (SINGULAIR) 10 MG tablet TAKE 1 TABLET BY MOUTH EVERYDAY AT BEDTIME Patient taking differently: Take 10 mg by mouth as needed (allergies). 05/11/21   Parrett, Virgel Bouquetammy S, NP  naloxone (NARCAN) nasal spray 4 mg/0.1 mL SMARTSIG:Both Nares 08/20/21   [provider]  nitroGLYCERIN (NITROSTAT) 0.4  MG SL tablet Place 0.4 mg under the tongue every 5 (five) minutes as needed for chest pain.    [provider]  Oxycodone HCl 20 MG TABS Take 20 mg by mouth 4 (four) times daily. 03/31/20   [provider]  pantoprazole (PROTONIX) 40 MG tablet Take 1 tablet (40 mg total) by mouth daily. 07/02/19   Elgergawy, Leana Roeawood S, MD  predniSONE (DELTASONE) 10 MG tablet Take 40 mg daily for 1 day, 30 mg daily for 1 day, 20 mg daily for 1 days,10 mg daily for 1 day, then stop 11/19/21   Maretta BeesGhimire, Shanker M, MD  sildenafil (VIAGRA) 100 MG tablet Take 1 tablet (100 mg total) by mouth daily as needed. Patient taking differently: Take 100 mg by mouth daily as needed for erectile dysfunction. 05/24/21     tamsulosin (FLOMAX) 0.4 MG CAPS capsule Take 0.4 mg by mouth daily. 04/26/21   [provider]  Tiotropium Bromide Monohydrate 2.5 MCG/ACT AERS Inhale 2 each into the lungs daily.    [provider]  Vitamin D, Ergocalciferol, (DRISDOL) 1.25 MG (50000 UNIT) CAPS capsule Take 50,000 Units by mouth once a week. Mondays 05/02/19   [provider]  XTAMPZA ER 9 MG C12A Take 9 mg by mouth 2 (two) times daily. 04/09/21   [provider]      Allergies    Insulins, Ibuprofen, Pork-derived products, Shrimp [shellfish allergy], Tylenol [acetaminophen], and Lisinopril    Review of Systems   Review of Systems  Respiratory:  Positive for shortness of breath.     Physical Exam Updated Vital Signs BP (!) 160/107   Pulse (!) 114   Temp 98.9 F (37.2 C) (Oral)   Resp (!) 29   SpO2 93%  Physical Exam Vitals and nursing note reviewed.  HENT:     Head: Normocephalic and atraumatic.     Mouth/Throat:     Mouth: Mucous membranes are moist.  Eyes:     General:        Right eye: No discharge.        Left eye: No discharge.     Conjunctiva/sclera: Conjunctivae normal.  Cardiovascular:     Rate and Rhythm: Regular rhythm. Tachycardia present.     Pulses: Normal pulses.      Heart sounds: Normal heart sounds.  Pulmonary:     Effort: Pulmonary effort is normal. Tachypnea present.     Breath sounds: Examination of the right-upper field reveals decreased breath sounds and wheezing. Examination of the left-upper field reveals decreased breath sounds and wheezing. Examination of the right-middle field reveals decreased breath sounds and wheezing. Examination of the left-middle field reveals decreased breath sounds and wheezing. Examination of the right-lower field reveals decreased breath sounds and wheezing. Examination of the left-lower field reveals decreased breath sounds and wheezing. Decreased breath sounds and wheezing present. No rhonchi or rales.  Abdominal:     General: Abdomen is  flat.     Palpations: Abdomen is soft.  Musculoskeletal:     Right lower leg: No tenderness. No edema.     Left lower leg: No tenderness. No edema.  Skin:    General: Skin is warm and dry.  Neurological:     General: No focal deficit present.  Psychiatric:        Mood and Affect: Mood normal.     ED Results / Procedures / Treatments   Labs (all labs ordered are listed, but only abnormal results are displayed) Labs Reviewed  COMPREHENSIVE METABOLIC PANEL - Abnormal; Notable for the following components:      Result Value   AST 43 (*)    All other components within normal limits  CBC WITH DIFFERENTIAL/PLATELET - Abnormal; Notable for the following components:   Monocytes Absolute 1.9 (*)    All other components within normal limits  D-DIMER, QUANTITATIVE - Abnormal; Notable for the following components:   D-Dimer, Quant 0.58 (*)    All other components within normal limits  BASIC METABOLIC PANEL - Abnormal; Notable for the following components:   Glucose, Bld 158 (*)    All other components within normal limits  CULTURE, BLOOD (ROUTINE X 2)  CULTURE, BLOOD (ROUTINE X 2)  RESP PANEL BY RT-PCR (FLU A&B, COVID) ARPGX2  LACTIC ACID, PLASMA  PROTIME-INR     EKG Revealed sinus tachycardia but otherwise non-ischemic  Radiology DG Chest 2 View  Result Date: 01/10/2022 CLINICAL DATA:  Shortness of breath, mid chest pain, and cough for 2 weeks; history of LEFT lung surgery, bronchiectasis, sarcoidosis, COPD, asthma, hypertension, type II diabetes mellitus EXAM: CHEST - 2 VIEW COMPARISON:  11/17/2021 FINDINGS: Chronic elevation LEFT diaphragm with postsurgical changes at LEFT hilum. Normal heart size, mediastinal contours, and pulmonary vascularity. Bibasilar and LEFT perihilar atelectasis versus scarring unchanged. No pulmonary infiltrate, pleural effusion, or pneumothorax. Bronchiectatic changes much better demonstrated on prior CT. Osseous structures unremarkable. IMPRESSION: Persistent bibasilar and LEFT perihilar atelectasis versus consolidation. Persistent elevation of LEFT hemidiaphragm with postoperative changes from LEFT lung surgery. No new intrathoracic abnormalities. Electronically Signed   By: Ulyses Southward M.D.   On: 01/10/2022 12:31    Procedures Procedures    Medications Ordered in ED Medications  magnesium sulfate IVPB 2 g 50 mL (2 g Intravenous New Bag/Given 01/10/22 1434)  cefTRIAXone (ROCEPHIN) 1 g in sodium chloride 0.9 % 100 mL IVPB (has no administration in time range)  azithromycin (ZITHROMAX) 500 mg in sodium chloride 0.9 % 250 mL IVPB (500 mg Intravenous New Bag/Given 01/10/22 1435)  benzonatate (TESSALON) capsule 100 mg (100 mg Oral Given 01/10/22 1421)  methylPREDNISolone sodium succinate (SOLU-MEDROL) 125 mg/2 mL injection 125 mg (125 mg Intravenous Given 01/10/22 1437)  albuterol (PROVENTIL) (2.5 MG/3ML) 0.083% nebulizer solution 2.5 mg (2.5 mg Nebulization Given 01/10/22 1438)  ipratropium (ATROVENT) nebulizer solution 0.5 mg (0.5 mg Nebulization Given 01/10/22 1438)    ED Course/ Medical Decision Making/ A&P                           Medical Decision Making Amount and/or Complexity of Data Reviewed Labs:  ordered. Radiology: ordered.  Risk Prescription drug management.   Initial Impression and Ddx 61 year old male in respiratory distress, nontoxic, and otherwise hemodynamically stable.  Physical exam notable for tachycardia, hypoxic, and tachypnea, diminished air entry in all lung fields and diffuse expiratory wheezing.  Initially treated with 3 L nasal cannula oxygen.  Sats immediately improved  from 70s to mid 90s.  Differential diagnosis for this complaint includes COPD exacerbation, pneumonia, ACS, CHF and PE. Patient PMH that increases complexity of ED encounter: COPD, diabetes, hyperlipidemia, sarcoidosis, hypertension and history of CVA  Interpretation of Diagnostics I independent reviewed and interpreted the labs as followed: Hyperglycemia, elevated D-dimer.  Per age-adjusted D-dimer rule, D-dimer is negative.  - I independently visualized the following imaging with scope of interpretation limited to determining acute life threatening conditions related to emergency care: chest X-ray, which revealed bibasilar and perihilar consolidation  Patient Reassessment and Ultimate Disposition/Management Treated for likely COPD exacerbation with IV steroids, continuous albuterol and ipratropium, IV mag.  Also treated empirically for pneumonia with ceftriaxone and azithromycin.  Considered PE given sinus tachycardia on arrival but unlikely given negative D-dimer.  Sinus tachycardia is likely related to multiple albuterol treatments at home.  Considered CHF but unlikely given no lower extremity edema, x-ray is without pulmonary vascular congestion or pleural effusion and no JVD or hepatomegaly on exam.  Considered ACS but unlikely given no evidence of ischemia on EKG. also nature of chest pain more consistent with pleuritic source. Signed out patient to Glenn Helper, PA-C who will continue to evaluate and manage.   Patient management required discussion with the following services or consulting groups:   None  Complexity of Problems Addressed Chronic illness with exacerbation  Additional Data Reviewed and Analyzed Further history obtained from: Further history from spouse/family member, Past medical history and medications listed in the EMR, Prior ED visit notes, and Recent discharge summary  Patient Encounter Risk Assessment Prescriptions and Consideration of hospitalization         Final Clinical Impression(s) / ED Diagnoses Final diagnoses:  COPD exacerbation (HCC)  Pneumonia due to infectious organism, unspecified laterality, unspecified part of lung    Rx / DC Orders ED Discharge Orders     None         Gareth Eagle, PA-C 01/10/22 1523    Benjiman Core, MD 01/11/22 [phone]

## 2022-01-10 NOTE — ED Provider Notes (Signed)
Received signout from previous provider, please see his note for complete H&P.  In short this is a 61 year old male with significant history of COPD, sarcoidosis, diabetes, hypertension who presents with cold symptoms.  Patient endorsed progressive worsening shortness of breath, productive cough, body aches, and decreased appetite ongoing for nearly 2 weeks.  He is now coughing up brown sputum.  He endorsed worsening shortness of breath.  He was noted to be hypoxic with an O2 sats of 83% on room air.  He was placed on 2 L of oxygen with improvement of his symptoms.  Workup today is remarkable for a positive influenza A test.  Patient has normal lactic acid, normal WBC.  Minimally elevated D-dimer of 0.58 however after age-adjusted, this is essentially normal.  X-ray of his chest shows persistent bibasilar and left perihilar atelectasis versus consolidation.  Patient was started on IV antibiotic for concerns of superimposed pneumonia in the setting of influenza infection.  I appreciate consultation from [facility] hospitalist Dr. Robb Matar who agrees to see and will admit patient for further management of his acute respiratory failure with hypoxia in the setting of viral infection.  .Critical Care  Performed by: Fayrene Helper, PA-C Authorized by: Fayrene Helper, PA-C   Critical care provider statement:    Critical care time (minutes):  30   Critical care was time spent personally by me on the following activities:  Development of treatment plan with patient or surrogate, discussions with consultants, evaluation of patient's response to treatment, examination of patient, ordering and review of laboratory studies, ordering and review of radiographic studies, ordering and performing treatments and interventions, pulse oximetry, re-evaluation of patient's condition and review of old charts    BP (!) 149/101   Pulse (!) 118   Temp 98.9 F (37.2 C)   Resp 20   SpO2 90%   Results for orders placed or performed  during the hospital encounter of 01/10/22  Resp Panel by RT-PCR (Flu A&B, Covid) Anterior Nasal Swab   Specimen: Anterior Nasal Swab  Result Value Ref Range   SARS Coronavirus 2 by RT PCR NEGATIVE NEGATIVE   Influenza A by PCR POSITIVE (A) NEGATIVE   Influenza B by PCR NEGATIVE NEGATIVE  Comprehensive metabolic panel  Result Value Ref Range   Sodium 140 135 - 145 mmol/L   Potassium 4.0 3.5 - 5.1 mmol/L   Chloride 101 98 - 111 mmol/L   CO2 28 22 - 32 mmol/L   Glucose, Bld 96 70 - 99 mg/dL   BUN 15 8 - 23 mg/dL   Creatinine, Ser 8.67 0.61 - 1.24 mg/dL   Calcium 8.9 8.9 - 67.2 mg/dL   Total Protein 7.7 6.5 - 8.1 g/dL   Albumin 4.2 3.5 - 5.0 g/dL   AST 43 (H) 15 - 41 U/L   ALT 20 0 - 44 U/L   Alkaline Phosphatase 64 38 - 126 U/L   Total Bilirubin 0.6 0.3 - 1.2 mg/dL   GFR, Estimated >09 >47 mL/min   Anion gap 11 5 - 15  Lactic acid, plasma  Result Value Ref Range   Lactic Acid, Venous 1.4 0.5 - 1.9 mmol/L  CBC with Differential  Result Value Ref Range   WBC 7.9 4.0 - 10.5 K/uL   RBC 4.78 4.22 - 5.81 MIL/uL   Hemoglobin 13.8 13.0 - 17.0 g/dL   HCT 09.6 28.3 - 66.2 %   MCV 90.2 80.0 - 100.0 fL   MCH 28.9 26.0 - 34.0 pg   MCHC 32.0  30.0 - 36.0 g/dL   RDW 41.7 40.8 - 14.4 %   Platelets 247 150 - 400 K/uL   nRBC 0.0 0.0 - 0.2 %   Neutrophils Relative % 59 %   Neutro Abs 4.7 1.7 - 7.7 K/uL   Lymphocytes Relative 15 %   Lymphs Abs 1.2 0.7 - 4.0 K/uL   Monocytes Relative 24 %   Monocytes Absolute 1.9 (H) 0.1 - 1.0 K/uL   Eosinophils Relative 2 %   Eosinophils Absolute 0.2 0.0 - 0.5 K/uL   Basophils Relative 0 %   Basophils Absolute 0.0 0.0 - 0.1 K/uL   Immature Granulocytes 0 %   Abs Immature Granulocytes 0.03 0.00 - 0.07 K/uL  Protime-INR  Result Value Ref Range   Prothrombin Time 13.4 11.4 - 15.2 seconds   INR 1.0 0.8 - 1.2  D-dimer, quantitative  Result Value Ref Range   D-Dimer, Quant 0.58 (H) 0.00 - 0.50 ug/mL-FEU  Basic metabolic panel  Result Value Ref Range    Sodium 139 135 - 145 mmol/L   Potassium 4.1 3.5 - 5.1 mmol/L   Chloride 102 98 - 111 mmol/L   CO2 29 22 - 32 mmol/L   Glucose, Bld 158 (H) 70 - 99 mg/dL   BUN 14 8 - 23 mg/dL   Creatinine, Ser 8.18 0.61 - 1.24 mg/dL   Calcium 8.9 8.9 - 56.3 mg/dL   GFR, Estimated >14 >97 mL/min   Anion gap 8 5 - 15   DG Chest 2 View  Result Date: 01/10/2022 CLINICAL DATA:  Shortness of breath, mid chest pain, and cough for 2 weeks; history of LEFT lung surgery, bronchiectasis, sarcoidosis, COPD, asthma, hypertension, type II diabetes mellitus EXAM: CHEST - 2 VIEW COMPARISON:  11/17/2021 FINDINGS: Chronic elevation LEFT diaphragm with postsurgical changes at LEFT hilum. Normal heart size, mediastinal contours, and pulmonary vascularity. Bibasilar and LEFT perihilar atelectasis versus scarring unchanged. No pulmonary infiltrate, pleural effusion, or pneumothorax. Bronchiectatic changes much better demonstrated on prior CT. Osseous structures unremarkable. IMPRESSION: Persistent bibasilar and LEFT perihilar atelectasis versus consolidation. Persistent elevation of LEFT hemidiaphragm with postoperative changes from LEFT lung surgery. No new intrathoracic abnormalities. Electronically Signed   By: Ulyses Southward M.D.   On: 01/10/2022 12:31      Fayrene Helper, PA-C 01/10/22 1710    Wynetta Fines, MD 01/11/22 [phone]

## 2022-01-10 NOTE — ED Triage Notes (Signed)
Pt reports SHOB x 2 weeks with worsening today. When ambulating pts O2 78% in triage. Pt place don 3L [place] at this time.

## 2022-01-10 NOTE — H&P (Signed)
History and Physical    Patient: Glenn [name] TIW:[id number] DOB: 07-Sep-1960 DOA: 01/10/2022 DOS: the patient was seen and examined on 01/10/2022 PCP: Fleet Contras, MD  Patient coming from: Home  Chief Complaint:  Chief Complaint  Patient presents with   Shortness of Breath   HPI: Glenn [name] is a 61 y.o. male with medical history significant of anxiety, depression, osteoarthritis, chronic pain syndrome, history of nonhemorrhagic CVA, type 2 diabetes, erectile dysfunction, hyperlipidemia, hypertension, COPD, sarcoidosis, former cigarette smoker who is coming to the emergency department with progressively worse dyspnea associated with productive cough and wheezing.  Earlier today he has some pleuritic chest pain.  He stated that about 2 weeks ago he has some cough after he was exposed to some of his grandchildren that had a mild viral illness.  He has on nonproductive cough and mild sore throat, but no significant symptoms.  However, over the last 4 days he has felt very fatigued, has had night sweats, myalgias, arthralgias, dyspnea, wheezing and significant increase in sputum production or yellowish sputum with bloody streaks. No palpitations, diaphoresis, PND, orthopnea or pitting edema of the lower extremities. No abdominal pain, nausea, emesis, diarrhea, constipation, melena or hematochezia.  No flank pain, dysuria, frequency or hematuria.  No polyuria, polydipsia, polyphagia or blurred vision.   ED course: Initial vital signs were temperature 98.9 F, pulse 124, respirations 22, BP 140/106 mmHg O2 sat 91% on room air.  At 1 point the patient desaturated to 83%.  He is currently on oxygen via nasal cannula at 2 LPM.  He also received a DuoNeb, azithromycin 500 mg IVPB, ceftriaxone 1 g IVPB, magnesium sulfate 2 g IVPB and methylprednisolone 125 mg IVP x 1.  Lab work: His CBC showed a white count of 7.9, hemoglobin 13.8 g/dL platelets 250.  PT 13.4 and INR 1.0.  D-dimer 0.58.  CMP with an AST  of 43 units/L, but otherwise unremarkable.  Lactic acid was normal.  Influenza and coronavirus PCR was positive for influenza A.  Imaging: Two-view chest radiograph with persistent bivascular and left perihilar atelectasis versus consolidation.  There is persistent elevation in the left hemidiaphragm with postoperative changes from left lung surgery.   Review of Systems: As mentioned in the history of present illness. All other systems reviewed and are negative. Past Medical History:  Diagnosis Date   Anxiety    Arthritis    Asthma    Chronic pain 05/06/2020   COPD (chronic obstructive pulmonary disease) (HCC)    CVA (cerebral vascular accident) (HCC)    2010; 06/2020   Depression    Diabetes mellitus type 2 in nonobese [facility])    Dyspnea    ED (erectile dysfunction)    Hyperlipidemia    Hypertension    Sarcoid    Tobacco dependence 05/06/2020   Past Surgical History:  Procedure Laterality Date   LUNG SURGERY     removed tissue   Social History:  reports that he quit smoking about 20 months ago. His smoking use included cigarettes. He has a 34.00 pack-year smoking history. He has never been exposed to tobacco smoke. He has never used smokeless tobacco. He reports that he does not drink alcohol and does not use drugs.  Allergies  Allergen Reactions   Insulins Swelling    04/2020. Do not use Novolog insulin. Swelling of tongue   Ibuprofen Other (See Comments)    Abdominal sensitivity   Pork-Derived Products Other (See Comments)    Cultural-Muslim   Shrimp [Shellfish  Allergy] Itching   Tylenol [Acetaminophen] Nausea Only    Abdominal sensitvity   Lisinopril Swelling and Rash    Tongue swelling    Family History  Problem Relation Age of Onset   Hypertension Mother    Diabetes Maternal Grandmother    Colon cancer Neg Hx    Esophageal cancer Neg Hx    Rectal cancer Neg Hx    Inflammatory bowel disease Neg Hx    Liver disease Neg Hx    Pancreatic cancer Neg Hx     Prior  to Admission medications   Medication Sig Start Date End Date Taking? Authorizing Provider  albuterol (PROAIR HFA) 108 (90 Base) MCG/ACT inhaler 2 puffs every 4 hours as needed only  if your can't catch your breath Patient taking differently: Inhale 2 puffs into the lungs 6 (six) times daily. 11/11/20   Nyoka CowdenWert, Michael B, MD  albuterol (PROVENTIL) (2.5 MG/3ML) 0.083% nebulizer solution Take 3 mLs (2.5 mg total) by nebulization every 4 (four) hours as needed for wheezing or shortness of breath. 11/19/21   Ghimire, Werner LeanShanker M, MD  AMITIZA 24 MCG capsule Take 24 mcg by mouth daily as needed for constipation. 03/31/20   [provider]  amLODipine (NORVASC) 10 MG tablet Take 1 tablet (10 mg total) by mouth daily. 10/03/21   Rai, Delene Ruffiniipudeep K, MD  Ascorbic Acid (VITAMIN C PO) Take 2 tablets by mouth daily.    [provider]  atorvastatin (LIPITOR) 40 MG tablet Take 1 tablet (40 mg total) by mouth daily. 06/26/20   Pokhrel, Rebekah ChesterfieldLaxman, MD  azithromycin (ZITHROMAX) 500 MG tablet Take 1 tablet (500 mg total) by mouth daily. 11/19/21   Ghimire, Werner LeanShanker M, MD  benzonatate (TESSALON) 200 MG capsule Take 1 capsule (200 mg total) by mouth 3 (three) times daily as needed for cough. 11/19/21   Ghimire, Werner LeanShanker M, MD  budesonide-formoterol (SYMBICORT) 160-4.5 MCG/ACT inhaler Inhale 2 puffs into the lungs 2 (two) times daily. 07/23/21   Nyoka CowdenWert, Michael B, MD  chlorpheniramine-HYDROcodone (TUSSIONEX) 10-8 MG/5ML Take 5 mLs by mouth every 12 (twelve) hours as needed (intractable cough). 11/19/21   Ghimire, Werner LeanShanker M, MD  clopidogrel (PLAVIX) 75 MG tablet Take 75 mg by mouth daily. 07/06/21   [provider]  famotidine (PEPCID) 20 MG tablet One after supper 11/22/21   Nyoka CowdenWert, Michael B, MD  fluticasone [facility](FLONASE) 50 MCG/ACT nasal spray Place 2 sprays into both nostrils daily. 11/19/21   Ghimire, Werner LeanShanker M, MD  gabapentin (NEURONTIN) 300 MG capsule Take 1 capsule (300 mg total) by mouth 3 (three) times  daily. Patient taking differently: Take 600 mg by mouth daily. 02/09/18   Eustace MooreNelson, Yvonne Sue, MD  loratadine (CLARITIN) 10 MG tablet Take 1 tablet (10 mg total) by mouth daily. 01/03/22     losartan-hydrochlorothiazide (HYZAAR) 100-12.5 MG tablet Take 1 tablet by mouth daily. 09/03/21   [provider]  metFORMIN (GLUCOPHAGE) 500 MG tablet Take 1 tablet (500 mg total) by mouth 2 (two) times daily with a meal. 10/03/21   Rai, Ripudeep K, MD  montelukast (SINGULAIR) 10 MG tablet TAKE 1 TABLET BY MOUTH EVERYDAY AT BEDTIME Patient taking differently: Take 10 mg by mouth as needed (allergies). 05/11/21   Parrett, Virgel Bouquetammy S, NP  naloxone (NARCAN) nasal spray 4 mg/0.1 mL SMARTSIG:Both Nares 08/20/21   [provider]  nitroGLYCERIN (NITROSTAT) 0.4 MG SL tablet Place 0.4 mg under the tongue every 5 (five) minutes as needed for chest pain.    [provider]  Oxycodone HCl 20 MG TABS Take 20 mg by mouth 4 (four) times daily. 03/31/20   [provider]  pantoprazole (PROTONIX) 40 MG tablet Take 1 tablet (40 mg total) by mouth daily. 07/02/19   Elgergawy, Leana Roe, MD  predniSONE (DELTASONE) 10 MG tablet Take 40 mg daily for 1 day, 30 mg daily for 1 day, 20 mg daily for 1 days,10 mg daily for 1 day, then stop 11/19/21   Maretta Bees, MD  sildenafil (VIAGRA) 100 MG tablet Take 1 tablet (100 mg total) by mouth daily as needed. Patient taking differently: Take 100 mg by mouth daily as needed for erectile dysfunction. 05/24/21     tamsulosin (FLOMAX) 0.4 MG CAPS capsule Take 0.4 mg by mouth daily. 04/26/21   [provider]  Tiotropium Bromide Monohydrate 2.5 MCG/ACT AERS Inhale 2 each into the lungs daily.    [provider]  Vitamin D, Ergocalciferol, (DRISDOL) 1.25 MG (50000 UNIT) CAPS capsule Take 50,000 Units by mouth once a week. Mondays 05/02/19   [provider]  XTAMPZA ER 9 MG C12A Take 9 mg by mouth 2 (two) times daily. 04/09/21   [provider]    Physical Exam: Vitals:   01/10/22 1430 01/10/22 1500 01/10/22 1530 01/10/22 1609  BP: (!) 160/107 (!) 151/107 (!) 145/107 (!) 149/101  Pulse: (!) 114 (!) 115 (!) 117 (!) 118  Resp: (!) 29 (!) 33 20 20  Temp:    98.9 F (37.2 C)  TempSrc:      SpO2: 93% 91% (!) 83% 90%   Physical Exam Vitals and nursing note reviewed.  Constitutional:      General: He is awake. He is not in acute distress.    Appearance: He is well-developed.     Interventions: Nasal cannula in place.  HENT:     Head: Normocephalic.     Nose: No rhinorrhea.     Mouth/Throat:     Mouth: Mucous membranes are moist.     Pharynx: No oropharyngeal exudate.  Eyes:     General: No scleral icterus.    Pupils: Pupils are equal, round, and reactive to light.  Neck:     Vascular: No JVD.  Cardiovascular:     Rate and Rhythm: Normal rate and regular rhythm.     Heart sounds: S1 normal and S2 normal.  Pulmonary:     Breath sounds: Examination of the right-lower field reveals decreased breath sounds. Examination of the left-lower field reveals decreased breath sounds. Decreased breath sounds and rhonchi present. No wheezing or rales.  Abdominal:     General: Bowel sounds are normal. There is no distension.     Palpations: Abdomen is soft.     Tenderness: There is no abdominal tenderness. There is no guarding.  Musculoskeletal:     Cervical back: Neck supple.     Right lower leg: No edema.     Left lower leg: No edema.  Skin:    General: Skin is warm and dry.  Neurological:     General: No focal deficit present.     Mental Status: He is alert and oriented to person, place, and time.  Psychiatric:        Mood and Affect: Mood normal.        Behavior: Behavior normal. Behavior is cooperative.  Data Reviewed:  There are no new results to review at this time.  11/08/2021 echocardiogram IMPRESSIONS:   1. Left ventricular ejection fraction, by estimation, is 55 to  60%. The  left ventricle has  normal function. The left ventricle has no regional  wall motion abnormalities. Left ventricular diastolic parameters are  consistent with Grade I diastolic  dysfunction (impaired relaxation).   2. Right ventricular systolic function is normal. The right ventricular  size is normal.   3. The mitral valve is normal in structure. No evidence of mitral valve  regurgitation. No evidence of mitral stenosis.   4. The aortic valve is tricuspid. Aortic valve regurgitation is not  visualized. No aortic stenosis is present.   5. The inferior vena cava is normal in size with greater than 50%  respiratory variability, suggesting right atrial pressure of 3 mmHg.   EKG:  Vent. rate 117 BPM PR interval 124 ms QRS duration 72 ms QT/QTcB 328/457 ms P-R-T axes 55 50 67 Sinus tachycardia Nonspecific T wave abnormality Abnormal ECG No prior tracings to compare to.  Assessment and Plan: Principal Problem:   COPD with acute exacerbation (HCC)   Acute respiratory failure with hypoxia (HCC) In the setting of:   Influenza A With possible superimposed:   CAP (community acquired pneumonia)  Admit to PCU/inpatient. Continue supplemental oxygen. Scheduled and as needed bronchodilators. Continue ceftriaxone 1 g IVPB daily. Continue azithromycin 500 mg IVPB daily. Unknown timeline more influenza A exposure. Begin Tamiflu 75 mg p.o. twice daily. No further wheezing, so will defer further glucocorticoids. Check strep pneumoniae urinary antigen. Check sputum Gram stain, culture and sensitivity. Follow-up blood culture and sensitivity. Follow-up CBC and chemistry in the morning.  Active Problems:   Diabetes mellitus type 2 in nonobese [facility]) History of side effects to insulin. No further wheezing so no prednisone order. Check hemoglobin A1c. Carbohydrate modified diet. Continue metformin 500 mg p.o. twice daily. CBG monitoring before meals and bedtime.    Hypertension Continue Hyzaar 100/12.5 mg  p.o. daily.    Chronic pain Continue oxycodone 20 mg 4 times a day as needed.    Hyperlipidemia Continue atorvastatin 40 mg p.o. daily.    BPH (benign prostatic hyperplasia)  Continue tamsulosin 0.4 mg p.o. daily.       Advance Care Planning:   Code Status: Prior   Consults:   Family Communication:   Severity of Illness: The appropriate patient status for this patient is INPATIENT. Inpatient status is judged to be reasonable and necessary in order to provide the required intensity of service to ensure the patient's safety. The patient's presenting symptoms, physical exam findings, and initial radiographic and laboratory data in the context of their chronic comorbidities is felt to place them at high risk for further clinical deterioration. Furthermore, it is not anticipated that the patient will be medically stable for discharge from the hospital within 2 midnights of admission.   * I certify that at the point of admission it is my clinical judgment that the patient will require inpatient hospital care spanning beyond 2 midnights from the point of admission due to high intensity of service, high risk for further deterioration and high frequency of surveillance required.*  Author: Bobette Mo, MD 01/10/2022 5:09 PM  For on call review [email or web address].   This document was prepared using Dragon voice recognition software and may contain some unintended transcription errors.

## 2022-01-11 ENCOUNTER — Ambulatory Visit (HOSPITAL_COMMUNITY): Payer: Medicaid Other

## 2022-01-11 ENCOUNTER — Telehealth (HOSPITAL_COMMUNITY): Payer: Self-pay

## 2022-01-11 DIAGNOSIS — J9601 Acute respiratory failure with hypoxia: Secondary | ICD-10-CM | POA: Diagnosis not present

## 2022-01-11 DIAGNOSIS — J129 Viral pneumonia, unspecified: Secondary | ICD-10-CM | POA: Diagnosis present

## 2022-01-11 LAB — CBC
HCT: 40.8 % (ref 39.0–52.0)
Hemoglobin: 12.9 g/dL — ABNORMAL LOW (ref 13.0–17.0)
MCH: 29.2 pg (ref 26.0–34.0)
MCHC: 31.6 g/dL (ref 30.0–36.0)
MCV: 92.3 fL (ref 80.0–100.0)
Platelets: 233 10*3/uL (ref 150–400)
RBC: 4.42 MIL/uL (ref 4.22–5.81)
RDW: 13.5 % (ref 11.5–15.5)
WBC: 7.1 10*3/uL (ref 4.0–10.5)
nRBC: 0 % (ref 0.0–0.2)

## 2022-01-11 LAB — EXPECTORATED SPUTUM ASSESSMENT W GRAM STAIN, RFLX TO RESP C

## 2022-01-11 LAB — HEMOGLOBIN A1C
Hgb A1c MFr Bld: 7.5 % — ABNORMAL HIGH (ref 4.8–5.6)
Mean Plasma Glucose: 169 mg/dL

## 2022-01-11 LAB — COMPREHENSIVE METABOLIC PANEL
ALT: 20 U/L (ref 0–44)
AST: 42 U/L — ABNORMAL HIGH (ref 15–41)
Albumin: 3.2 g/dL — ABNORMAL LOW (ref 3.5–5.0)
Alkaline Phosphatase: 51 U/L (ref 38–126)
Anion gap: 9 (ref 5–15)
BUN: 18 mg/dL (ref 8–23)
CO2: 27 mmol/L (ref 22–32)
Calcium: 8.1 mg/dL — ABNORMAL LOW (ref 8.9–10.3)
Chloride: 100 mmol/L (ref 98–111)
Creatinine, Ser: 0.81 mg/dL (ref 0.61–1.24)
GFR, Estimated: >60 mL/min (ref >60)
Glucose, Bld: 144 mg/dL — ABNORMAL HIGH (ref 70–99)
Potassium: 4.2 mmol/L (ref 3.5–5.1)
Sodium: 136 mmol/L (ref 135–145)
Total Bilirubin: 0.4 mg/dL (ref 0.3–1.2)
Total Protein: 6.8 g/dL (ref 6.5–8.1)

## 2022-01-11 LAB — PROCALCITONIN: Procalcitonin: <0.1 ng/mL

## 2022-01-11 LAB — CBG MONITORING, ED
Glucose-Capillary: 237 mg/dL — ABNORMAL HIGH (ref 70–99)
Glucose-Capillary: 143 mg/dL — ABNORMAL HIGH (ref 70–99)
Glucose-Capillary: 208 mg/dL — ABNORMAL HIGH (ref 70–99)

## 2022-01-11 LAB — STREP PNEUMONIAE URINARY ANTIGEN: Strep Pneumo Urinary Antigen: NEGATIVE

## 2022-01-11 LAB — GLUCOSE, CAPILLARY
Glucose-Capillary: 117 mg/dL — ABNORMAL HIGH (ref 70–99)
Glucose-Capillary: 131 mg/dL — ABNORMAL HIGH (ref 70–99)

## 2022-01-11 MED ORDER — DOXYCYCLINE HYCLATE 100 MG PO TABS
100.0000 mg | ORAL_TABLET | Freq: Two times a day (BID) | ORAL | Status: DC
  Administered 2022-01-11 – 2022-01-13 (×4): 100 mg via ORAL
  Filled 2022-01-11 (×5): qty 1

## 2022-01-11 MED ORDER — LUBIPROSTONE 24 MCG PO CAPS: 24.0000 ug | ORAL_CAPSULE | Freq: Every day | ORAL | Status: DC | PRN

## 2022-01-11 MED ORDER — TIOTROPIUM BROMIDE MONOHYDRATE 2.5 MCG/ACT IN AERS: 2.0000 | INHALATION_SPRAY | Freq: Every day | RESPIRATORY_TRACT | Status: DC

## 2022-01-11 MED ORDER — BENZONATATE 100 MG PO CAPS
200.0000 mg | ORAL_CAPSULE | Freq: Three times a day (TID) | ORAL | Status: DC
  Administered 2022-01-11 – 2022-01-13 (×6): 200 mg via ORAL
  Filled 2022-01-11 (×6): qty 2

## 2022-01-11 MED ORDER — UMECLIDINIUM BROMIDE 62.5 MCG/ACT IN AEPB
1.0000 | INHALATION_SPRAY | Freq: Every day | RESPIRATORY_TRACT | Status: DC
  Administered 2022-01-11 – 2022-01-13 (×3): 1 via RESPIRATORY_TRACT
  Filled 2022-01-11: qty 7

## 2022-01-11 NOTE — Progress Notes (Signed)
   01/11/22 1543  Assess: MEWS Score  Temp 97.9 F (36.6 C)  BP (!) 139/94  MAP (mmHg) 108  Pulse Rate (!) 101  Resp (!) 24  SpO2 92 %  O2 Device Nasal Cannula  O2 Flow Rate (L/min) 1 L/min  Assess: MEWS Score  MEWS Temp 0  MEWS Systolic 0  MEWS Pulse 1  MEWS RR 1  MEWS LOC 0  MEWS Score 2  MEWS Score Color Yellow  Assess: if the MEWS score is Yellow or Red  Were vital signs taken at a resting state? Yes  Focused Assessment No change from prior assessment  Does the patient meet 2 or more of the SIRS criteria? Yes  Does the patient have a confirmed or suspected source of infection? Yes  Provider and Rapid Response Notified? No  MEWS guidelines implemented *See Row Information* Yes  Treat  MEWS Interventions Administered scheduled meds/treatments (Antibiotics)  Take Vital Signs  Increase Vital Sign Frequency  Yellow: Q 2hr X 2 then Q 4hr X 2, if remains yellow, continue Q 4hrs  Escalate  MEWS: Escalate Yellow: discuss with charge nurse/RN and consider discussing with provider and RRT  Notify: Charge Nurse/RN  Name of Charge Nurse/RN Notified Trinda Pascal, RN  Date Charge Nurse/RN Notified 01/11/22  Time Charge Nurse/RN Notified 1554  Provider Notification  Provider Name/Title Dr. Pola Corn  Date Provider Notified 01/11/22  Time Provider Notified 1552  Method of Notification Call (secure chat)  Notification Reason Other (Comment) (MD made aware)  Provider response No new orders  Date of Provider Response 01/11/22  Time of Provider Response 1555  Document  Patient Outcome Stabilized after interventions  Progress note created (see row info) Yes  Assess: SIRS CRITERIA  SIRS Temperature  0  SIRS Pulse 1  SIRS Respirations  1  SIRS WBC 1  SIRS Score Sum  3

## 2022-01-11 NOTE — ED Notes (Signed)
Pt complaining of cough . Pt medicated per [facility]

## 2022-01-11 NOTE — Progress Notes (Signed)
PROGRESS NOTE  Glenn [name]  DOB: [birth year]  PCP: Fleet Contras, MD PQD:[id number]  DOA: 01/10/2022  LOS: 1 day  Hospital Day: 2  Brief narrative: Glenn [name] is a 61 y.o. male with PMH significant for DM2, HTN, HLD, nonhemorrhagic CVA, COPD, anxiety/depression, arthritis, chronic pain, sarcoidosis. 12/4, patient presented to the ED with complaint of progressively worsening dyspnea, cough, wheezing. 2 weeks ago, he was exposed to some of his grandchildren who had mild viral illness.  He started having nonproductive cough and mild sore throat but over the past 4 days, he started having severe symptoms associated with myalgia, arthralgia, dyspnea, wheezing and significant increasing sputum production.  In the ED, patient was afebrile, heart rate elevated 124, breathing in low 20s, required 2 L oxygen by nasal cannula. Initial labs with unremarkable CBC, unremarkable CMP, normal lactic acid, negative procalcitonin level D-dimer slightly elevated 0.58 Influenza A positive Chest x-ray was obtained and was compared with the x-ray from 10/11.  It did not show any new intrathoracic abnormality but showed persistent bibasilar and left perihilar atelectasis versus consolidation.  Persistent elevation of left hemidiaphragm with postoperative changes from left lung surgery.   Admitted to [facility]  Subjective: Patient was seen and examined this morning in the ED.  Pleasant middle-aged African-American male.  Looks older for his age.  On low-flow oxygen.  Not in distress. Chart review No fever, tachycardia improved overnight, blood pressure in 120s, on 1 to 2 L oxygen by nasal cannula.  Assessment and plan: Influenza A Acute respiratory failure with hypoxia Presented with worsening severity respiratory symptoms No fever.  WBC count normal.  Procalcitonin negative.  Chest x-ray without any new infiltrates. No evidence of new bacterial pneumonia I would continue Tamiflu 75 mg twice daily.  I would  also add doxycycline for anti-inflammatory properties.  No need of antibiotics at this time Continue supportive care with scheduled Tessalon Perles, PRN robitussin and as needed bronchodilators. Wean down supplemental oxygen.  Check ambulatory oxygen requirement Recent Labs  Lab 01/10/22 1243 01/11/22 0538  WBC 7.9 7.1  LATICACIDVEN 1.4  --   PROCALCITON <0.10 <0.10   COPD with acute exacerbation Acute flareup of COPD due to flu. Wheezing not obvious on my exam. Continue bronchodilators as before.    Type 2 diabetes mellitus with hyperglycemia A1c 7.5 on 01/10/2022 PTA on metformin 500 mg twice daily.  Currently on hold Currently on sliding scale insulin with Accu-Cheks. Recent Labs  Lab 01/11/22 0105 01/11/22 0821 01/11/22 1429  GLUCAP 237* 143* 208*   Essential hypertension PTA on amlodipine 10 mg daily, losartan 100 mg daily, HCTZ 12.5 mg daily  Continue all   History of nonhemorrhagic CVA Hyperlipidemia Continue Plavix, atorvastatin 40 mg p.o. daily.   BPH (benign prostatic hyperplasia)  Continue tamsulosin 0.4 mg p.o. daily.  Chronic pain Continue oxycodone 20 mg 4 times a day as needed. Bowel regimen to continue with Amitiza PRN   Goals of care   Code Status: Full Code    Mobility: Encourage ambulation  Infusions:   azithromycin     cefTRIAXone (ROCEPHIN)  IV 1 g (01/11/22 1437)    Scheduled Meds:  atorvastatin  40 mg Oral Daily   benzonatate  200 mg Oral TID   clopidogrel  75 mg Oral Daily   doxycycline  100 mg Oral Q12H   famotidine  20 mg Oral Daily   gabapentin  600 mg Oral Daily   losartan  100 mg Oral Daily   And  hydrochlorothiazide  12.5 mg Oral Daily   loratadine  10 mg Oral Daily   metFORMIN  500 mg Oral BID WC   mometasone-formoterol  2 puff Inhalation BID   oseltamivir  75 mg Oral BID   pantoprazole  40 mg Oral Daily   tamsulosin  0.4 mg Oral Daily   umeclidinium bromide  1 puff Inhalation Daily    PRN  meds: chlorpheniramine-HYDROcodone, lubiprostone, montelukast, nitroGLYCERIN, ondansetron **OR** ondansetron (ZOFRAN) IV, oxyCODONE   Skin assessment:     Nutritional status:  There is no height or weight on file to calculate BMI.          Diet:  Diet Order             Diet heart healthy/carb modified Room service appropriate? Yes; Fluid consistency: Thin  Diet effective now                   DVT prophylaxis:  SCDs Start: 01/10/22 1722   Antimicrobials: Tamiflu, doxycycline Fluid: None Consultants: None Family Communication: None at bedside  Status is: Inpatient  Continue in-hospital care because: Weak, in respiratory distress.  On low-flow oxygen Level of care: Telemetry   Dispo: The patient is from: Home              Anticipated d/c is to: Pending clinical course              Patient currently is not medically stable to d/c.   Difficult to place patient No       Antimicrobials: Anti-infectives (From admission, onward)    Start     Dose/Rate Route Frequency Ordered Stop   01/11/22 1530  doxycycline (VIBRA-TABS) tablet 100 mg        100 mg Oral Every 12 hours 01/11/22 1528     01/11/22 1430  cefTRIAXone (ROCEPHIN) 1 g in sodium chloride 0.9 % 100 mL IVPB        1 g 200 mL/hr over 30 Minutes Intravenous Every 24 hours 01/10/22 1730 01/15/22 1429   01/11/22 1430  azithromycin (ZITHROMAX) 500 mg in sodium chloride 0.9 % 250 mL IVPB        500 mg 250 mL/hr over 60 Minutes Intravenous Every 24 hours 01/10/22 1730 01/15/22 1429   01/10/22 1815  oseltamivir (TAMIFLU) capsule 75 mg        75 mg Oral 2 times daily 01/10/22 1802 01/15/22 2159   01/10/22 1430  cefTRIAXone (ROCEPHIN) 1 g in sodium chloride 0.9 % 100 mL IVPB        1 g 200 mL/hr over 30 Minutes Intravenous  Once 01/10/22 1420 01/10/22 1851   01/10/22 1430  azithromycin (ZITHROMAX) 500 mg in sodium chloride 0.9 % 250 mL IVPB        500 mg 250 mL/hr over 60 Minutes Intravenous  Once 01/10/22 1420  01/10/22 1609       Objective: Vitals:   01/11/22 1400 01/11/22 1444  BP: (!) 131/91   Pulse: (!) 103   Resp: (!) 21   Temp:  98.2 F (36.8 C)  SpO2: 99%     Intake/Output Summary (Last 24 hours) at 01/11/2022 1530 Last data filed at 01/10/2022 1851 Gross per 24 hour  Intake 400 ml  Output --  Net 400 ml   There were no vitals filed for this visit. Weight change:  There is no height or weight on file to calculate BMI.   Physical Exam: General exam: Pleasant, middle-aged African-American male Skin: No rashes,  lesions or ulcers. HEENT: Atraumatic, normocephalic, no obvious bleeding Lungs: No wheezing, coughs on deep breathing CVS: Regular rate and rhythm, no murmur GI/Abd soft, nontender, nondistended, bowel sound present CNS: Alert, awake, oriented x 3 Psychiatry: Sad affect Extremities: No pedal edema, no calf tenderness  Data Review: I have personally reviewed the laboratory data and studies available.  F/u labs ordered Unresulted Labs (From admission, onward)     Start     Ordered   01/11/22 1049  Culture, Respiratory w Gram Stain  Once,   R        01/11/22 1049            Signed, Lorin Glass, MD [facility] 01/11/2022

## 2022-01-11 NOTE — Telephone Encounter (Signed)
Called pt to check on him. Glenn [name] is currently at [facility] with pneumonia. I offered to d/c Glenn [name] so that he can focus on his health. Pt agreed. I told Glenn [name] to ask for a new referral from Dr. Sherene Sires when he is ready to start Pulmonary Rehab. Glenn [name] agreed and verbalized understanding.

## 2022-01-12 DIAGNOSIS — J9601 Acute respiratory failure with hypoxia: Secondary | ICD-10-CM | POA: Diagnosis not present

## 2022-01-12 LAB — GLUCOSE, CAPILLARY
Glucose-Capillary: 135 mg/dL — ABNORMAL HIGH (ref 70–99)
Glucose-Capillary: 80 mg/dL (ref 70–99)
Glucose-Capillary: 142 mg/dL — ABNORMAL HIGH (ref 70–99)

## 2022-01-12 MED ORDER — METHYLPREDNISOLONE SODIUM SUCC 40 MG IJ SOLR
40.0000 mg | Freq: Two times a day (BID) | INTRAMUSCULAR | Status: DC
  Administered 2022-01-12 – 2022-01-13 (×3): 40 mg via INTRAVENOUS
  Filled 2022-01-12 (×3): qty 1

## 2022-01-12 NOTE — Progress Notes (Signed)
Mobility Specialist - Progress Note   01/12/22 1200  Mobility  Activity Ambulated independently in room;Ambulated independently to bathroom;Ambulated independently in hallway  Level of Assistance Independent after set-up  Assistive Device None  Distance Ambulated (ft) 500 ft  Activity Response Tolerated well  Mobility Referral Yes  $Mobility charge 1 Mobility   Nurse requested Mobility Specialist to perform oxygen saturation test with pt which includes removing pt from oxygen both at rest and while ambulating.  Below are the results from that testing.     Patient Saturations on Room Air at Rest = spO2 95% Patient Saturations on Room Air while Ambulating = sp02 85% .  Rested and performed pursed lip breathing for 1 minute with sp02 at 88%. Patient Saturations on 2 Liters of oxygen while Ambulating = sp02 94% At end of testing pt left in room on 1  Liters of oxygen.  Reported results to nurse.   Pt received in bed. No c/o pain nor discomfort, pt returned to bed with all needs met and staff in room.   Roderick Pee Mobility Specialist

## 2022-01-12 NOTE — Progress Notes (Signed)
PT Cancellation Note/ Screen  Patient Details Name: Glenn [name] MRN: [id number] DOB: 01/12/1961   Cancelled Treatment:    Reason Eval/Treat Not Completed: PT screened, no needs identified, will sign off Pt ambulating independently with mobility specialist.  Pt does not appear to have acute PT needs at this time.  PT to sign off.   [name] 01/12/2022, 2:18 PM Paulino Door, DPT Physical Therapist Acute Rehabilitation Services Preferred contact method: Secure Chat Weekend Pager Only: [phone] Office: [phone]

## 2022-01-12 NOTE — Progress Notes (Signed)
  Transition of Care [facility]) Screening Note   Patient Details  Name: Glenn [name] Date of Birth: November 04, 1960   Transition of Care [facility]) CM/SW Contact:    Otelia Santee, LCSW Phone Number: 01/12/2022, 3:06 PM    [facility]) has reviewed patient and no TOC needs have been identified at this time. We will continue to monitor patient advancement through interdisciplinary progression rounds. If new patient transition needs arise, please place a TOC consult.

## 2022-01-12 NOTE — Progress Notes (Signed)
PROGRESS NOTE  RAJESH WYSS  DOB: June 03, 1960  PCP: Fleet Contras, MD SEG:[id number]  DOA: 01/10/2022  LOS: 2 days  Hospital Day: 3  Brief narrative: Glenn [name] is a 61 y.o. male with PMH significant for DM2, HTN, HLD, nonhemorrhagic CVA, COPD, anxiety/depression, arthritis, chronic pain, sarcoidosis. 12/4, patient presented to the ED with complaint of progressively worsening dyspnea, cough, wheezing. 2 weeks ago, he was exposed to some of his grandchildren who had mild viral illness.  He started having nonproductive cough and mild sore throat but over the past 4 days, he started having severe symptoms associated with myalgia, arthralgia, dyspnea, wheezing and significant increasing sputum production.  In the ED, patient was afebrile, heart rate elevated 124, breathing in low 20s, required 2 L oxygen by nasal cannula. Initial labs with unremarkable CBC, unremarkable CMP, normal lactic acid, negative procalcitonin level D-dimer slightly elevated 0.58 Influenza A positive Chest x-ray was obtained and was compared with the x-ray from 10/11.  It did not show any new intrathoracic abnormality but showed persistent bibasilar and left perihilar atelectasis versus consolidation.  Persistent elevation of left hemidiaphragm with postoperative changes from left lung surgery.   Admitted to [facility]  Subjective: Patient was seen and examined this morning.  Propped up in bed.  Not in distress feeling feeling better.  Still on low flow oxygen.  Has diffuse bilateral wheezing.  Coughs on deep breathing.  Encourage ambulation in the hallway today.  No family at bedside.  Assessment and plan: Influenza A Acute respiratory failure with hypoxia Presented with worsening severity respiratory symptoms No fever.  WBC count normal.  Procalcitonin negative.  Chest x-ray without any new infiltrates. No evidence of new bacterial pneumonia Currently on Tamiflu 75 mg twice daily and doxycycline for anti-inflammatory  properties.   On exam today, he has diffuse bilateral wheezing.  I would add IV Solu-Medrol 40 mg twice daily to the regimen. Continue supportive care with scheduled Tessalon Perles, PRN robitussin and as needed bronchodilators. Wean down supplemental oxygen.  Check ambulatory oxygen requirement Recent Labs  Lab 01/10/22 1243 01/11/22 0538  WBC 7.9 7.1  LATICACIDVEN 1.4  --   PROCALCITON <0.10 <0.10   COPD with acute exacerbation History of left lung lobectomy Acute flareup of COPD due to flu. Continue steroids.  Continue bronchodilators as before.    Type 2 diabetes mellitus with hyperglycemia A1c 7.5 on 01/10/2022 PTA on metformin 500 mg twice daily.  Currently on hold Currently on sliding scale insulin with Accu-Cheks. Recent Labs  Lab 01/11/22 1429 01/11/22 1645 01/11/22 2216 01/12/22 0732 01/12/22 1202  GLUCAP 208* 117* 131* 135* 80   Essential hypertension PTA on amlodipine 10 mg daily, losartan 100 mg daily, HCTZ 12.5 mg daily  Continue all   History of nonhemorrhagic CVA Hyperlipidemia Continue Plavix, atorvastatin 40 mg p.o. daily.   BPH (benign prostatic hyperplasia)  Continue tamsulosin 0.4 mg p.o. daily.  Chronic pain Continue oxycodone 20 mg 4 times a day as needed. Bowel regimen to continue with Amitiza PRN   Goals of care   Code Status: Full Code    Mobility: Encourage ambulation  Infusions:     Scheduled Meds:  atorvastatin  40 mg Oral Daily   benzonatate  200 mg Oral TID   clopidogrel  75 mg Oral Daily   doxycycline  100 mg Oral Q12H   famotidine  20 mg Oral Daily   gabapentin  600 mg Oral Daily   losartan  100 mg Oral Daily  And   hydrochlorothiazide  12.5 mg Oral Daily   loratadine  10 mg Oral Daily   metFORMIN  500 mg Oral BID WC   methylPREDNISolone (SOLU-MEDROL) injection  40 mg Intravenous Q12H   mometasone-formoterol  2 puff Inhalation BID   oseltamivir  75 mg Oral BID   pantoprazole  40 mg Oral Daily   tamsulosin  0.4 mg  Oral Daily   umeclidinium bromide  1 puff Inhalation Daily    PRN meds: chlorpheniramine-HYDROcodone, lubiprostone, montelukast, nitroGLYCERIN, ondansetron **OR** ondansetron (ZOFRAN) IV, oxyCODONE   Skin assessment:     Nutritional status:  There is no height or weight on file to calculate BMI.          Diet:  Diet Order             Diet heart healthy/carb modified Room service appropriate? Yes; Fluid consistency: Thin  Diet effective now                   DVT prophylaxis:  SCDs Start: 01/10/22 1722   Antimicrobials: Tamiflu, doxycycline Fluid: None Consultants: None Family Communication: None at bedside  Status is: Inpatient  Continue in-hospital care because: Has bilateral wheezing, remains on supplemental oxygen. Level of care: Telemetry   Dispo: The patient is from: Home              Anticipated d/c is to: Pending clinical course              Patient currently is not medically stable to d/c.   Difficult to place patient No       Antimicrobials: Anti-infectives (From admission, onward)    Start     Dose/Rate Route Frequency Ordered Stop   01/11/22 1530  doxycycline (VIBRA-TABS) tablet 100 mg        100 mg Oral Every 12 hours 01/11/22 1528     01/11/22 1430  cefTRIAXone (ROCEPHIN) 1 g in sodium chloride 0.9 % 100 mL IVPB  Status:  Discontinued        1 g 200 mL/hr over 30 Minutes Intravenous Every 24 hours 01/10/22 1730 01/12/22 1100   01/11/22 1430  azithromycin (ZITHROMAX) 500 mg in sodium chloride 0.9 % 250 mL IVPB  Status:  Discontinued        500 mg 250 mL/hr over 60 Minutes Intravenous Every 24 hours 01/10/22 1730 01/12/22 1100   01/10/22 1815  oseltamivir (TAMIFLU) capsule 75 mg        75 mg Oral 2 times daily 01/10/22 1802 01/15/22 2159   01/10/22 1430  cefTRIAXone (ROCEPHIN) 1 g in sodium chloride 0.9 % 100 mL IVPB        1 g 200 mL/hr over 30 Minutes Intravenous  Once 01/10/22 1420 01/10/22 1851   01/10/22 1430  azithromycin  (ZITHROMAX) 500 mg in sodium chloride 0.9 % 250 mL IVPB        500 mg 250 mL/hr over 60 Minutes Intravenous  Once 01/10/22 1420 01/10/22 1609       Objective: Vitals:   01/12/22 0346 01/12/22 0740  BP: 127/80   Pulse: 82   Resp: 16   Temp: 97.6 F (36.4 C)   SpO2: 96% 97%   No intake or output data in the 24 hours ending 01/12/22 1320  There were no vitals filed for this visit. Weight change:  There is no height or weight on file to calculate BMI.   Physical Exam: General exam: Pleasant, middle-aged African-American male Skin: No rashes, lesions or  ulcers. HEENT: Atraumatic, normocephalic, no obvious bleeding Lungs: Diffuse bilateral wheezing, coughs on deep breathing.   CVS: Regular rate and rhythm, no murmur GI/Abd soft, nontender, nondistended, bowel sound present CNS: Alert, awake, oriented x 3 Psychiatry: Sad affect Extremities: No pedal edema, no calf tenderness  Data Review: I have personally reviewed the laboratory data and studies available.  F/u labs ordered Unresulted Labs (From admission, onward)    None       Signed, Lorin Glass, MD [facility] 01/12/2022

## 2022-01-13 ENCOUNTER — Ambulatory Visit (HOSPITAL_COMMUNITY): Payer: Medicaid Other

## 2022-01-13 LAB — CULTURE, RESPIRATORY W GRAM STAIN: Culture: NORMAL

## 2022-01-13 LAB — GLUCOSE, CAPILLARY
Glucose-Capillary: 241 mg/dL — ABNORMAL HIGH (ref 70–99)
Glucose-Capillary: 152 mg/dL — ABNORMAL HIGH (ref 70–99)
Glucose-Capillary: 220 mg/dL — ABNORMAL HIGH (ref 70–99)

## 2022-01-13 MED ORDER — PREDNISONE 10 MG PO TABS: ORAL_TABLET | ORAL | 0 refills | Status: DC

## 2022-01-13 MED ORDER — GUAIFENESIN-DM 100-10 MG/5ML PO SYRP: 5.0000 mL | ORAL_SOLUTION | ORAL | 0 refills | Status: DC | PRN

## 2022-01-13 MED ORDER — BENZONATATE 200 MG PO CAPS: 200.0000 mg | ORAL_CAPSULE | Freq: Three times a day (TID) | ORAL | 0 refills | Status: AC

## 2022-01-13 MED ORDER — DOXYCYCLINE HYCLATE 100 MG PO TABS: 100.0000 mg | ORAL_TABLET | Freq: Two times a day (BID) | ORAL | 0 refills | Status: AC

## 2022-01-13 MED ORDER — OSELTAMIVIR PHOSPHATE 75 MG PO CAPS: 75.0000 mg | ORAL_CAPSULE | Freq: Two times a day (BID) | ORAL | 0 refills | Status: AC

## 2022-01-13 NOTE — TOC Transition Note (Signed)
Transition of Care [facility]) - CM/SW Discharge Note   Patient Details  Name: Glenn [name] MRN: [id number] Date of Birth: 12-May-1960  Transition of Care [facility]) CM/SW Contact:  Otelia Santee, LCSW Phone Number: 01/13/2022, 2:42 PM   Clinical Narrative:    Pt to discharge home with new O2 requirement. O2 has been ordered through [employer] and will be delivered to pt's room prior to discharge.    Final next level of care: Home/Self Care Barriers to Discharge: No Barriers Identified   Patient Goals and CMS Choice Patient states their goals for this hospitalization and ongoing recovery are:: To go home CMS Medicare.gov Compare Post Acute Care list provided to:: Patient Choice offered to / list presented to : Patient  Discharge Placement                       Discharge Plan and Services                DME Arranged: Oxygen DME Agency: [employer] Date DME Agency Contacted: 01/13/22 Time DME Agency Contacted: [phone] Representative spoke with at DME Agency: Cecilie Kicks            Social Determinants of Health (SDOH) Interventions Food Insecurity Interventions: Intervention Not Indicated Housing Interventions: Intervention Not Indicated Transportation Interventions: Intervention Not Indicated Utilities Interventions: Intervention Not Indicated   Readmission Risk Interventions    01/13/2022    8:19 AM 01/12/2022    3:06 PM 05/25/2020   11:50 AM  Readmission Risk Prevention Plan  Transportation Screening Complete Complete Complete  PCP or Specialist Appt within 5-7 Days  Complete   PCP or Specialist Appt within 3-5 Days Complete    Home Care Screening  Complete   Medication Review (RN CM)  Complete   HRI or Home Care Consult Complete    Social Work Consult for Recovery Care Planning/Counseling Complete    Palliative Care Screening Not Applicable    Medication Review [employer]) Complete  Complete  PCP or Specialist appointment within 3-5 days of discharge    Complete  HRI or Home Care Consult   Complete  SW Recovery Care/Counseling Consult   Complete  Palliative Care Screening   Not Applicable  Skilled Nursing Facility   Not Applicable

## 2022-01-13 NOTE — Discharge Summary (Addendum)
Physician Discharge Summary  Glenn [name] CWC:[id number] DOB: Aug 23, 1960 DOA: 01/10/2022  PCP: Nolene Ebbs, MD  Admit date: 01/10/2022 Discharge date: 01/13/2022  Admitted From: Home Discharge disposition: Home with home oxygen  Recommendations at discharge:  Continue Tamiflu and doxycycline at home to complete 5-day course. Continue tapering course of prednisone at home Continue scheduled and as needed antitussives Home oxygen ordered  Brief narrative: Glenn [name] is a 61 y.o. male with PMH significant for DM2, HTN, HLD, nonhemorrhagic CVA, COPD, anxiety/depression, arthritis, chronic pain, sarcoidosis. 12/4, patient presented to the ED with complaint of progressively worsening dyspnea, cough, wheezing. 2 weeks ago, he was exposed to some of his grandchildren who had mild viral illness.  He started having nonproductive cough and mild sore throat but over the past 4 days, he started having severe symptoms associated with myalgia, arthralgia, dyspnea, wheezing and significant increasing sputum production.  In the ED, patient was afebrile, heart rate elevated 124, breathing in low 20s, required 2 L oxygen by nasal cannula. Initial labs with unremarkable CBC, unremarkable CMP, normal lactic acid, negative procalcitonin level D-dimer slightly elevated 0.58 Influenza A positive Chest x-ray was obtained and was compared with the x-ray from 10/11.  It did not show any new intrathoracic abnormality but showed persistent bibasilar and left perihilar atelectasis versus consolidation.  Persistent elevation of left hemidiaphragm with postoperative changes from left lung surgery.   Admitted to [facility]  Subjective: Patient was seen and examined this morning.   Active in bed.  Not in distress.  On low-flow oxygen.  Required supplemental oxygen on ambulation.  Cough is getting much better.    Assessment and plan: Influenza A Acute respiratory failure with hypoxia Presented with worsening  severity respiratory symptoms No fever.  WBC count normal.  Procalcitonin negative.  Chest x-ray without any new infiltrates. No evidence of new bacterial pneumonia He was started on a 5-day course of Tamiflu 75 mg twice daily and doxycycline for anti-inflammatory properties as well as IV steroids. Wheezing, cough has significantly improved Continue Tamiflu and doxycycline at home to complete 5-day course. Continue tapering course of prednisone at home Continue scheduled and as needed antitussives  Qualified for home oxygen on ambulation Patient Saturations on Room Air at Rest = spO2 92% Patient Saturations on Room Air while Ambulating = sp02 88% .  Rested and performed pursed lip breathing for 1 minute with sp02 at 89%. Patient Saturations on 1 Liters of oxygen while Ambulating = sp02 94% At end of testing pt left in room on 1 Liters of oxygen.  COPD with acute exacerbation History of left lung lobectomy Acute flareup of COPD due to flu. Continue steroids as above.  Continue bronchodilators as before.    Type 2 diabetes mellitus with hyperglycemia A1c 7.5 on 01/10/2022 PTA on metformin 500 mg twice daily. Can resume at discharge.  Essential hypertension PTA on amlodipine 10 mg daily, losartan 100 mg daily, HCTZ 12.5 mg daily  Continue all   History of nonhemorrhagic CVA Hyperlipidemia Continue Plavix, atorvastatin 40 mg p.o. daily.   BPH (benign prostatic hyperplasia)  Continue tamsulosin 0.4 mg p.o. daily.  Chronic pain Continue oxycodone 20 mg 4 times a day as needed. Bowel regimen to continue with Amitiza PRN  Wounds:  -    Discharge Exam:   Vitals:   01/12/22 1948 01/12/22 2014 01/13/22 0845 01/13/22 0846  BP:  (!) 136/92    Pulse:  99    Resp:  18    Temp:  97.9 F (36.6 C)    TempSrc:  Oral    SpO2: 99% 94% 94% 95%    There is no height or weight on file to calculate BMI.  General exam: Pleasant, middle-aged African-American male Skin: No rashes,  lesions or ulcers. HEENT: Atraumatic, normocephalic, no obvious bleeding Lungs: No wheezing or cough on auscultation today. CVS: Regular rate and rhythm, no murmur GI/Abd soft, nontender, nondistended, bowel sound present CNS: Alert, awake, oriented x 3 Psychiatry: Mood appropriate Extremities: No pedal edema, no calf tenderness  Follow ups:    Follow-up Information     Nolene Ebbs, MD Follow up.   Specialty: Internal Medicine Contact information: [address] [place] [address] [phone]                 Discharge Instructions:   Discharge Instructions     Call MD for:  difficulty breathing, headache or visual disturbances   Complete by: As directed    Call MD for:  extreme fatigue   Complete by: As directed    Call MD for:  hives   Complete by: As directed    Call MD for:  persistant dizziness or light-headedness   Complete by: As directed    Call MD for:  persistant nausea and vomiting   Complete by: As directed    Call MD for:  severe uncontrolled pain   Complete by: As directed    Call MD for:  temperature >100.4   Complete by: As directed    Diet general   Complete by: As directed    Discharge instructions   Complete by: As directed    Recommendations at discharge:   Continue Tamiflu and doxycycline at home to complete 5-day course.  Continue tapering course of prednisone at home  Continue scheduled and as needed antitussives  Home oxygen ordered  General discharge instructions: Follow with Primary MD Nolene Ebbs, MD in 7 days  Please request your PCP  to go over your hospital tests, procedures, radiology results at the follow up. Please get your medicines reviewed and adjusted.  Your PCP may decide to repeat certain labs or tests as needed. Do not drive, operate heavy machinery, perform activities at heights, swimming or participation in water activities or provide baby sitting services if your were admitted for syncope or  siezures until you have seen by Primary MD or a Neurologist and advised to do so again. [place] Controlled Substance Reporting System database was reviewed. Do not drive, operate heavy machinery, perform activities at heights, swim, participate in water activities or provide baby-sitting services while on medications for pain, sleep and mood until your outpatient physician has reevaluated you and advised to do so again.  You are strongly recommended to comply with the dose, frequency and duration of prescribed medications. Activity: As tolerated with Full fall precautions use walker/cane & assistance as needed Avoid using any recreational substances like cigarette, tobacco, alcohol, or non-prescribed drug. If you experience worsening of your admission symptoms, develop shortness of breath, life threatening emergency, suicidal or homicidal thoughts you must seek medical attention immediately by calling 911 or calling your MD immediately  if symptoms less severe. You must read complete instructions/literature along with all the possible adverse reactions/side effects for all the medicines you take and that have been prescribed to you. Take any new medicine only after you have completely understood and accepted all the possible adverse reactions/side effects.  Wear Seat belts while driving. You were cared for by a hospitalist  during your hospital stay. If you have any questions about your discharge medications or the care you received while you were in the hospital after you are discharged, you can call the unit and ask to speak with the hospitalist or the covering physician. Once you are discharged, your primary care physician will handle any further medical issues. Please note that NO REFILLS for any discharge medications will be authorized once you are discharged, as it is imperative that you return to your primary care physician (or establish a relationship with a primary care physician if you do not  have one).   Increase activity slowly   Complete by: As directed        Discharge Medications:   Allergies as of 01/13/2022       Reactions   Insulins Swelling   04/2020. Do not use Novolog insulin. Swelling of tongue   Ibuprofen Other (See Comments)   Abdominal sensitivity   Pork-derived Products Other (See Comments)   Cultural-Muslim   Shrimp [shellfish Allergy] Itching   Tylenol [acetaminophen] Nausea Only   Abdominal sensitvity   Lisinopril Swelling, Rash   Tongue swelling        Medication List     STOP taking these medications    azithromycin 500 MG tablet Commonly known as: ZITHROMAX   chlorpheniramine-HYDROcodone 10-8 MG/5ML Commonly known as: TUSSIONEX       TAKE these medications    albuterol 108 (90 Base) MCG/ACT inhaler Commonly known as: ProAir HFA 2 puffs every 4 hours as needed only  if your can't catch your breath What changed:  how much to take how to take this when to take this reasons to take this additional instructions   albuterol (2.5 MG/3ML) 0.083% nebulizer solution Commonly known as: PROVENTIL Take 3 mLs (2.5 mg total) by nebulization every 4 (four) hours as needed for wheezing or shortness of breath. What changed: Another medication with the same name was changed. Make sure you understand how and when to take each.   Amitiza 24 MCG capsule Generic drug: lubiprostone Take 24 mcg by mouth daily as needed for constipation.   amLODipine 10 MG tablet Commonly known as: NORVASC Take 1 tablet (10 mg total) by mouth daily.   atorvastatin 40 MG tablet Commonly known as: LIPITOR Take 1 tablet (40 mg total) by mouth daily.   benzonatate 200 MG capsule Commonly known as: TESSALON Take 1 capsule (200 mg total) by mouth 3 (three) times daily for 5 days. What changed:  when to take this reasons to take this   budesonide-formoterol 160-4.5 MCG/ACT inhaler Commonly known as: Symbicort Inhale 2 puffs into the lungs 2 (two) times  daily.   clopidogrel 75 MG tablet Commonly known as: PLAVIX Take 75 mg by mouth daily.   doxycycline 100 MG tablet Commonly known as: VIBRA-TABS Take 1 tablet (100 mg total) by mouth every 12 (twelve) hours for 3 days.   famotidine 20 MG tablet Commonly known as: Pepcid One after supper What changed:  how much to take how to take this when to take this additional instructions   fluticasone 50 MCG/ACT nasal spray Commonly known as: FLONASE Place 2 sprays into both nostrils daily.   gabapentin 300 MG capsule Commonly known as: Neurontin Take 1 capsule (300 mg total) by mouth 3 (three) times daily. What changed:  how much to take when to take this   guaiFENesin-dextromethorphan 100-10 MG/5ML syrup Commonly known as: ROBITUSSIN DM Take 5 mLs by mouth every 4 (four) hours as  needed for cough.   loratadine 10 MG tablet Commonly known as: Claritin Take 1 tablet (10 mg total) by mouth daily.   losartan-hydrochlorothiazide 100-12.5 MG tablet Commonly known as: HYZAAR Take 1 tablet by mouth daily.   metFORMIN 500 MG tablet Commonly known as: GLUCOPHAGE Take 1 tablet (500 mg total) by mouth 2 (two) times daily with a meal.   montelukast 10 MG tablet Commonly known as: SINGULAIR TAKE 1 TABLET BY MOUTH EVERYDAY AT BEDTIME What changed: See the new instructions.   naloxone 4 MG/0.1ML Liqd nasal spray kit Commonly known as: NARCAN SMARTSIG:Both Nares   nitroGLYCERIN 0.4 MG SL tablet Commonly known as: NITROSTAT Place 0.4 mg under the tongue every 5 (five) minutes as needed for chest pain.   oseltamivir 75 MG capsule Commonly known as: TAMIFLU Take 1 capsule (75 mg total) by mouth 2 (two) times daily for 3 days.   Oxycodone HCl 20 MG Tabs Take 20 mg by mouth 4 (four) times daily.   pantoprazole 40 MG tablet Commonly known as: PROTONIX Take 1 tablet (40 mg total) by mouth daily.   predniSONE 10 MG tablet Commonly known as: DELTASONE Take 4 tablets daily X 2  days, then, Take 3 tablets daily X 2 days, then, Take 2 tablets daily X 2 days, then, Take 1 tablets daily X 1 day. What changed: additional instructions   promethazine-dextromethorphan 6.25-15 MG/5ML syrup Commonly known as: PROMETHAZINE-DM Take 15 mLs by mouth every 12 (twelve) hours.   sildenafil 100 MG tablet Commonly known as: VIAGRA Take 1 tablet (100 mg total) by mouth daily as needed. What changed: reasons to take this   tamsulosin 0.4 MG Caps capsule Commonly known as: FLOMAX Take 0.4 mg by mouth daily.   Tiotropium Bromide Monohydrate 2.5 MCG/ACT Aers Inhale 2 each into the lungs daily.   VITAMIN C PO Take 2 tablets by mouth daily.   Vitamin D (Ergocalciferol) 1.25 MG (50000 UNIT) Caps capsule Commonly known as: DRISDOL Take 50,000 Units by mouth once a week. Mondays   Xtampza ER 9 MG C12a Generic drug: oxyCODONE ER Take 9 mg by mouth 2 (two) times daily.               Durable Medical Equipment  (From admission, onward)           Start     Ordered   01/13/22 0821  For home use only DME oxygen  Once       Question Answer Comment  Length of Need Lifetime   Mode or (Route) Nasal cannula   Liters per Minute 2   Frequency Continuous (stationary and portable oxygen unit needed)   Oxygen conserving device Yes   Oxygen delivery system Gas      01/13/22 0820             The results of significant diagnostics from this hospitalization (including imaging, microbiology, ancillary and laboratory) are listed below for reference.    Procedures and Diagnostic Studies:   DG Chest 2 View  Result Date: 01/10/2022 CLINICAL DATA:  Shortness of breath, mid chest pain, and cough for 2 weeks; history of LEFT lung surgery, bronchiectasis, sarcoidosis, COPD, asthma, hypertension, type II diabetes mellitus EXAM: CHEST - 2 VIEW COMPARISON:  11/17/2021 FINDINGS: Chronic elevation LEFT diaphragm with postsurgical changes at LEFT hilum. Normal heart size, mediastinal  contours, and pulmonary vascularity. Bibasilar and LEFT perihilar atelectasis versus scarring unchanged. No pulmonary infiltrate, pleural effusion, or pneumothorax. Bronchiectatic changes much better demonstrated on prior CT.  Osseous structures unremarkable. IMPRESSION: Persistent bibasilar and LEFT perihilar atelectasis versus consolidation. Persistent elevation of LEFT hemidiaphragm with postoperative changes from LEFT lung surgery. No new intrathoracic abnormalities. Electronically Signed   By: Lavonia Dana M.D.   On: 01/10/2022 12:31     Labs:   Basic Metabolic Panel: Recent Labs  Lab 01/10/22 1243 01/10/22 1430 01/11/22 0538  NA 140 139 136  K 4.0 4.1 4.2  CL 101 102 100  CO2 _0 GLUCOSE 96 158* 144*  BUN _1 CREATININE 0.78 0.92 0.81  CALCIUM 8.9 8.9 8.1*   GFR CrCl cannot be calculated (Unknown ideal weight.). Liver Function Tests: Recent Labs  Lab 01/10/22 1243 01/11/22 0538  AST 43* 42*  ALT 20 20  ALKPHOS 64 51  BILITOT 0.6 0.4  PROT 7.7 6.8  ALBUMIN 4.2 3.2*   No results for input(s): "LIPASE", "AMYLASE" in the last 168 hours. No results for input(s): "AMMONIA" in the last 168 hours. Coagulation profile Recent Labs  Lab 01/10/22 1243  INR 1.0    CBC: Recent Labs  Lab 01/10/22 1243 01/11/22 0538  WBC 7.9 7.1  NEUTROABS 4.7  --   HGB 13.8 12.9*  HCT 43.1 40.8  MCV 90.2 92.3  PLT 247 233   Cardiac Enzymes: No results for input(s): "CKTOTAL", "CKMB", "CKMBINDEX", "TROPONINI" in the last 168 hours. BNP: Invalid input(s): "POCBNP" CBG: Recent Labs  Lab 01/12/22 1202 01/12/22 1647 01/12/22 2140 01/13/22 0729 01/13/22 1207  GLUCAP 80 142* 241* 152* 220*   D-Dimer No results for input(s): "DDIMER" in the last 72 hours. Hgb A1c No results for input(s): "HGBA1C" in the last 72 hours. Lipid Profile No results for input(s): "CHOL", "HDL", "LDLCALC", "TRIG", "CHOLHDL", "LDLDIRECT" in the last 72 hours. Thyroid function studies No  results for input(s): "TSH", "T4TOTAL", "T3FREE", "THYROIDAB" in the last 72 hours.  Invalid input(s): "FREET3" Anemia work up No results for input(s): "VITAMINB12", "FOLATE", "FERRITIN", "TIBC", "IRON", "RETICCTPCT" in the last 72 hours. Microbiology Recent Results (from the past 240 hour(s))  Culture, blood (Routine x 2)     Status: None (Preliminary result)   Collection Time: 01/10/22 12:44 PM   Specimen: Left Antecubital; Blood  Result Value Ref Range Status   Specimen Description LEFT ANTECUBITAL BLOOD  Final   Special Requests   Final    Blood Culture results may not be optimal due to an inadequate volume of blood received in culture bottles BOTTLES DRAWN AEROBIC AND ANAEROBIC   Culture   Final    NO GROWTH 3 DAYS Performed at [facility], [place] [address], [place], [place] [address]    Report Status PENDING  Incomplete  Culture, blood (Routine x 2)     Status: None (Preliminary result)   Collection Time: 01/10/22  2:16 PM   Specimen: Left Antecubital; Blood  Result Value Ref Range Status   Specimen Description LEFT ANTECUBITAL BLOOD  Final   Special Requests   Final    Blood Culture adequate volume BOTTLES DRAWN AEROBIC AND ANAEROBIC   Culture   Final    NO GROWTH 3 DAYS Performed at [facility], [place] [address] [place], [place] [address]    Report Status PENDING  Incomplete  Resp Panel by RT-PCR (Flu A&B, Covid) Anterior Nasal Swab     Status: Abnormal   Collection Time: 01/10/22  2:54 PM   Specimen: Anterior Nasal Swab  Result Value Ref Range Status   SARS Coronavirus 2 by RT  PCR NEGATIVE NEGATIVE Final    Comment: (NOTE) SARS-CoV-2 target nucleic acids are NOT DETECTED.  The SARS-CoV-2 RNA is generally detectable in upper respiratory specimens during the acute phase of infection. The lowest concentration of SARS-CoV-2 viral copies this assay can detect is 138 copies/mL. A negative result does not preclude SARS-Cov-2 infection and should not be used as  the sole basis for treatment or other patient management decisions. A negative result may occur with  improper specimen collection/handling, submission of specimen other than nasopharyngeal swab, presence of viral mutation(s) within the areas targeted by this assay, and inadequate number of viral copies(<138 copies/mL). A negative result must be combined with clinical observations, patient history, and epidemiological information. The expected result is Negative.  Fact Sheet for Patients:  EntrepreneurPulse.com.au  Fact Sheet for Healthcare Providers:  IncredibleEmployment.be  This test is no t yet approved or cleared by the [place] FDA and  has been authorized for detection and/or diagnosis of SARS-CoV-2 by FDA under an Emergency Use Authorization (EUA). This EUA will remain  in effect (meaning this test can be used) for the duration of the COVID-19 declaration under Section 564(b)(1) of the Act, 21 U.S.C.section 360bbb-3(b)(1), unless the authorization is terminated  or revoked sooner.       Influenza A by PCR POSITIVE (A) NEGATIVE Final   Influenza B by PCR NEGATIVE NEGATIVE Final    Comment: (NOTE) The Xpert Xpress SARS-CoV-2/FLU/RSV plus assay is intended as an aid in the diagnosis of influenza from Nasopharyngeal swab specimens and should not be used as a sole basis for treatment. Nasal washings and aspirates are unacceptable for Xpert Xpress SARS-CoV-2/FLU/RSV testing.  Fact Sheet for Patients: EntrepreneurPulse.com.au  Fact Sheet for Healthcare Providers: IncredibleEmployment.be  This test is not yet approved or cleared by the [place] FDA and has been authorized for detection and/or diagnosis of SARS-CoV-2 by FDA under an Emergency Use Authorization (EUA). This EUA will remain in effect (meaning this test can be used) for the duration of the COVID-19 declaration under Section  564(b)(1) of the Act, 21 U.S.C. section 360bbb-3(b)(1), unless the authorization is terminated or revoked.  Performed at [facility], [place] [address], [place], [place] [address]   Expectorated Sputum Assessment w Gram Stain, Rflx to Resp Cult     Status: None   Collection Time: 01/11/22 10:49 AM   Specimen: Sputum  Result Value Ref Range Status   Specimen Description SPUTUM  Final   Special Requests NONE  Final   Sputum evaluation   Final    THIS SPECIMEN IS ACCEPTABLE FOR SPUTUM CULTURE Performed at [facility], [place] [address] [place], [place] [address]    Report Status 01/11/2022 FINAL  Final  Culture, Respiratory w Gram Stain     Status: None   Collection Time: 01/11/22 10:49 AM   Specimen: SPU  Result Value Ref Range Status   Specimen Description   Final    SPUTUM Performed at [place] [address] [place], [place] [address]    Special Requests   Final    NONE Reflexed from [id number] Performed at [place] [address], [place], [place] [address]    Gram Stain   Final    MODERATE WBC PRESENT,BOTH PMN AND MONONUCLEAR NO ORGANISMS SEEN    Culture   Final    FEW Normal respiratory flora-no Staph aureus or Pseudomonas seen Performed at [facility], [address], [place], [place] [address]  Report Status 01/13/2022 FINAL  Final    Time coordinating discharge: 35 minutes  Signed: Aerial Dilley  [facility] 01/13/2022, 1:43 PM

## 2022-01-13 NOTE — Progress Notes (Signed)
Mobility Specialist - Progress Note   01/13/22 1133  Mobility  Activity Ambulated with assistance in hallway  Level of Assistance Independent after set-up  Assistive Device None  Distance Ambulated (ft) 450 ft  Activity Response Tolerated well  Mobility Referral Yes  $Mobility charge 1 Mobility   Nurse requested Mobility Specialist to perform oxygen saturation test with pt which includes removing pt from oxygen both at rest and while ambulating.  Below are the results from that testing.     Patient Saturations on Room Air at Rest = spO2 92% Patient Saturations on Room Air while Ambulating = sp02 88% .  Rested and performed pursed lip breathing for 1 minute with sp02 at 89%. Patient Saturations on 1 Liters of oxygen while Ambulating = sp02 94% At end of testing pt left in room on 1 Liters of oxygen.  Reported results to nurse.   Pt received in bed and agreed to mobility. Pt had no c/o pain nor discomfort. One coughing spell during session, pt back to bed with all needs met.   Roderick Pee Mobility Specialist

## 2022-01-15 ENCOUNTER — Emergency Department (HOSPITAL_COMMUNITY)
Admission: EM | Admit: 2022-01-15 | Discharge: 2022-01-15 | Disposition: A | Discharge status: Home / Self Care | Payer: Medicaid Other | Attending: Emergency Medicine | Admitting: Emergency Medicine

## 2022-01-15 ENCOUNTER — Emergency Department (HOSPITAL_COMMUNITY): Payer: Medicaid Other

## 2022-01-15 ENCOUNTER — Other Ambulatory Visit: Payer: Self-pay

## 2022-01-15 ENCOUNTER — Encounter (HOSPITAL_COMMUNITY): Payer: Self-pay

## 2022-01-15 DIAGNOSIS — Z7951 Long term (current) use of inhaled steroids: Secondary | ICD-10-CM | POA: Insufficient documentation

## 2022-01-15 DIAGNOSIS — Z20822 Contact with and (suspected) exposure to covid-19: Secondary | ICD-10-CM | POA: Diagnosis not present

## 2022-01-15 DIAGNOSIS — J449 Chronic obstructive pulmonary disease, unspecified: Secondary | ICD-10-CM | POA: Insufficient documentation

## 2022-01-15 DIAGNOSIS — R1110 Vomiting, unspecified: Secondary | ICD-10-CM

## 2022-01-15 DIAGNOSIS — R0602 Shortness of breath: Secondary | ICD-10-CM | POA: Insufficient documentation

## 2022-01-15 DIAGNOSIS — R1030 Lower abdominal pain, unspecified: Secondary | ICD-10-CM | POA: Diagnosis not present

## 2022-01-15 DIAGNOSIS — R062 Wheezing: Secondary | ICD-10-CM

## 2022-01-15 LAB — CULTURE, BLOOD (ROUTINE X 2)
Culture: NO GROWTH
Culture: NO GROWTH
Special Requests: ADEQUATE

## 2022-01-15 LAB — CBC
HCT: 42 % (ref 39.0–52.0)
Hemoglobin: 13.9 g/dL (ref 13.0–17.0)
MCH: 29.5 pg (ref 26.0–34.0)
MCHC: 33.1 g/dL (ref 30.0–36.0)
MCV: 89.2 fL (ref 80.0–100.0)
Platelets: 249 10*3/uL (ref 150–400)
RBC: 4.71 MIL/uL (ref 4.22–5.81)
RDW: 13.3 % (ref 11.5–15.5)
WBC: 7.5 10*3/uL (ref 4.0–10.5)
nRBC: 0 % (ref 0.0–0.2)

## 2022-01-15 LAB — LIPASE, BLOOD: Lipase: 32 U/L (ref 11–51)

## 2022-01-15 LAB — URINALYSIS, ROUTINE W REFLEX MICROSCOPIC
Bilirubin Urine: NEGATIVE
Glucose, UA: NEGATIVE mg/dL
Hgb urine dipstick: NEGATIVE
Ketones, ur: NEGATIVE mg/dL
Leukocytes,Ua: NEGATIVE
Nitrite: NEGATIVE
Protein, ur: NEGATIVE mg/dL
Specific Gravity, Urine: 1.015 (ref 1.005–1.030)
pH: 7 (ref 5.0–8.0)

## 2022-01-15 LAB — COMPREHENSIVE METABOLIC PANEL
ALT: 21 U/L (ref 0–44)
AST: 21 U/L (ref 15–41)
Albumin: 3.7 g/dL (ref 3.5–5.0)
Alkaline Phosphatase: 55 U/L (ref 38–126)
Anion gap: 11 (ref 5–15)
BUN: 15 mg/dL (ref 8–23)
CO2: 30 mmol/L (ref 22–32)
Calcium: 9 mg/dL (ref 8.9–10.3)
Chloride: 99 mmol/L (ref 98–111)
Creatinine, Ser: 0.87 mg/dL (ref 0.61–1.24)
GFR, Estimated: >60 mL/min (ref >60)
Glucose, Bld: 100 mg/dL — ABNORMAL HIGH (ref 70–99)
Potassium: 3.3 mmol/L — ABNORMAL LOW (ref 3.5–5.1)
Sodium: 140 mmol/L (ref 135–145)
Total Bilirubin: 0.5 mg/dL (ref 0.3–1.2)
Total Protein: 6.6 g/dL (ref 6.5–8.1)

## 2022-01-15 LAB — I-STAT VENOUS BLOOD GAS, ED
Acid-Base Excess: 7 mmol/L — ABNORMAL HIGH (ref 0.0–2.0)
Bicarbonate: 31.5 mmol/L — ABNORMAL HIGH (ref 20.0–28.0)
Calcium, Ion: 1.12 mmol/L — ABNORMAL LOW (ref 1.15–1.40)
HCT: 42 % (ref 39.0–52.0)
Hemoglobin: 14.3 g/dL (ref 13.0–17.0)
O2 Saturation: 100 %
Potassium: 3.3 mmol/L — ABNORMAL LOW (ref 3.5–5.1)
Sodium: 138 mmol/L (ref 135–145)
TCO2: 33 mmol/L — ABNORMAL HIGH (ref 22–32)
pCO2, Ven: 43.9 mmHg — ABNORMAL LOW (ref 44–60)
pH, Ven: 7.463 — ABNORMAL HIGH (ref 7.25–7.43)
pO2, Ven: 166 mmHg — ABNORMAL HIGH (ref 32–45)

## 2022-01-15 LAB — RESP PANEL BY RT-PCR (RSV, FLU A&B, COVID)  RVPGX2
Influenza A by PCR: NEGATIVE
Influenza B by PCR: NEGATIVE
Resp Syncytial Virus by PCR: NEGATIVE
SARS Coronavirus 2 by RT PCR: NEGATIVE

## 2022-01-15 LAB — CBG MONITORING, ED: Glucose-Capillary: 99 mg/dL (ref 70–99)

## 2022-01-15 MED ORDER — METHYLPREDNISOLONE SODIUM SUCC 125 MG IJ SOLR
125.0000 mg | Freq: Once | INTRAMUSCULAR | Status: AC
  Administered 2022-01-15: 125 mg via INTRAVENOUS
  Filled 2022-01-15: qty 2

## 2022-01-15 MED ORDER — SODIUM CHLORIDE 0.9 % IV BOLUS
1000.0000 mL | Freq: Once | INTRAVENOUS | Status: AC
  Administered 2022-01-15: 1000 mL via INTRAVENOUS

## 2022-01-15 MED ORDER — IPRATROPIUM-ALBUTEROL 0.5-2.5 (3) MG/3ML IN SOLN
3.0000 mL | Freq: Once | RESPIRATORY_TRACT | Status: AC
  Administered 2022-01-15: 3 mL via RESPIRATORY_TRACT
  Filled 2022-01-15: qty 3

## 2022-01-15 MED ORDER — METHYLPREDNISOLONE 4 MG PO TBPK: ORAL_TABLET | ORAL | 0 refills | Status: DC

## 2022-01-15 MED ORDER — METHYLPREDNISOLONE SODIUM SUCC 125 MG IJ SOLR: 125.0000 mg | Freq: Every day | INTRAMUSCULAR | Status: DC

## 2022-01-15 MED ORDER — DEXTROMETHORPHAN HBR 15 MG PO TABS: ORAL_TABLET | ORAL | 0 refills | Status: DC

## 2022-01-15 NOTE — ED Provider Notes (Signed)
[facility] EMERGENCY DEPARTMENT Provider Note   CSN: [id number] Arrival date & time: 01/15/22  0503     History  Chief Complaint  Patient presents with   Abdominal Pain    Glenn [name] is a 61 y.o. male.  Who presents emergency department with chief complaints of lower abdominal pain and shortness of breath.  History of COPD, sarcoidosis, recent admission for Influenza/COPD exacerbation and pneumonia.  Patient seated in medical screening evaluation by PA Sponseller who reports the appearance of COPD exacerbation, poor air movement and suspicion of abdominal pain secondary to abdominal breathing and difficulty moving.  His vomiting is post tussive.  He reports feeling better after leaving the hospital in terms of increased breathing ability but he has persistent coughing.  His mother who is at bedside states that "the cough medicine does not seem to be working well." Patient reports that his abdominal pain is feeling much better at the moment.  He has difficulty describing the pain.  He denies urinary symptoms, back pain, constipation. He is on chronic opiate therapy. He has been using Promethazine cough syrup. He takes Albuterol, Symbicort, tiotropium bromide, singular, prednisone at daily.   Abdominal Pain      Home Medications Prior to Admission medications   Medication Sig Start Date End Date Taking? Authorizing Provider  albuterol (PROAIR HFA) 108 (90 Base) MCG/ACT inhaler 2 puffs every 4 hours as needed only  if your can't catch your breath Patient taking differently: Inhale 2 puffs into the lungs every 6 (six) hours as needed for shortness of breath. 11/11/20   [name], MD  albuterol (PROVENTIL) (2.5 MG/3ML) 0.083% nebulizer solution Take 3 mLs (2.5 mg total) by nebulization every 4 (four) hours as needed for wheezing or shortness of breath. 11/19/21   Ghimire, Werner Lean, MD  AMITIZA 24 MCG capsule Take 24 mcg by mouth daily as needed for constipation.  03/31/20   [provider]  amLODipine (NORVASC) 10 MG tablet Take 1 tablet (10 mg total) by mouth daily. 10/03/21   Rai, Delene Ruffini, MD  Ascorbic Acid (VITAMIN C PO) Take 2 tablets by mouth daily.    [provider]  atorvastatin (LIPITOR) 40 MG tablet Take 1 tablet (40 mg total) by mouth daily. 06/26/20   Pokhrel, Rebekah Chesterfield, MD  benzonatate (TESSALON) 200 MG capsule Take 1 capsule (200 mg total) by mouth 3 (three) times daily for 5 days. 01/13/22 01/18/22  Lorin Glass, MD  budesonide-formoterol (SYMBICORT) 160-4.5 MCG/ACT inhaler Inhale 2 puffs into the lungs 2 (two) times daily. 07/23/21   [name], MD  clopidogrel (PLAVIX) 75 MG tablet Take 75 mg by mouth daily. 07/06/21   [provider]  doxycycline (VIBRA-TABS) 100 MG tablet Take 1 tablet (100 mg total) by mouth every 12 (twelve) hours for 3 days. 01/13/22 01/16/22  Lorin Glass, MD  famotidine (PEPCID) 20 MG tablet One after supper Patient taking differently: Take 20 mg by mouth daily. 11/22/21   [name], MD  fluticasone (FLONASE) 50 MCG/ACT nasal spray Place 2 sprays into both nostrils daily. 11/19/21   Ghimire, Werner Lean, MD  gabapentin (NEURONTIN) 300 MG capsule Take 1 capsule (300 mg total) by mouth 3 (three) times daily. Patient taking differently: Take 600 mg by mouth daily. 02/09/18   Eustace Moore, MD  guaiFENesin-dextromethorphan (ROBITUSSIN DM) 100-10 MG/5ML syrup Take 5 mLs by mouth every 4 (four) hours as needed for cough. 01/13/22   Lorin Glass, MD  loratadine (CLARITIN)  10 MG tablet Take 1 tablet (10 mg total) by mouth daily. 01/03/22     losartan-hydrochlorothiazide (HYZAAR) 100-12.5 MG tablet Take 1 tablet by mouth daily. 09/03/21   [provider]  metFORMIN (GLUCOPHAGE) 500 MG tablet Take 1 tablet (500 mg total) by mouth 2 (two) times daily with a meal. 10/03/21   Rai, Ripudeep K, MD  montelukast (SINGULAIR) 10 MG tablet TAKE 1 TABLET BY MOUTH EVERYDAY AT BEDTIME Patient taking  differently: Take 10 mg by mouth daily as needed (allergies). 05/11/21   Parrett, Virgel Bouquet, NP  naloxone (NARCAN) nasal spray 4 mg/0.1 mL SMARTSIG:Both Nares 08/20/21   [provider]  nitroGLYCERIN (NITROSTAT) 0.4 MG SL tablet Place 0.4 mg under the tongue every 5 (five) minutes as needed for chest pain.    [provider]  oseltamivir (TAMIFLU) 75 MG capsule Take 1 capsule (75 mg total) by mouth 2 (two) times daily for 3 days. 01/13/22 01/16/22  Lorin Glass, MD  Oxycodone HCl 20 MG TABS Take 20 mg by mouth 4 (four) times daily. 03/31/20   [provider]  pantoprazole (PROTONIX) 40 MG tablet Take 1 tablet (40 mg total) by mouth daily. 07/02/19   Elgergawy, Leana Roe, MD  predniSONE (DELTASONE) 10 MG tablet Take 4 tablets daily X 2 days, then, Take 3 tablets daily X 2 days, then, Take 2 tablets daily X 2 days, then, Take 1 tablets daily X 1 day. 01/13/22   Lorin Glass, MD  promethazine-dextromethorphan (PROMETHAZINE-DM) 6.25-15 MG/5ML syrup Take 15 mLs by mouth every 12 (twelve) hours.    [provider]  sildenafil (VIAGRA) 100 MG tablet Take 1 tablet (100 mg total) by mouth daily as needed. Patient taking differently: Take 100 mg by mouth daily as needed for erectile dysfunction. 05/24/21     tamsulosin (FLOMAX) 0.4 MG CAPS capsule Take 0.4 mg by mouth daily. 04/26/21   [provider]  Tiotropium Bromide Monohydrate 2.5 MCG/ACT AERS Inhale 2 each into the lungs daily.    [provider]  Vitamin D, Ergocalciferol, (DRISDOL) 1.25 MG (50000 UNIT) CAPS capsule Take 50,000 Units by mouth once a week. Mondays 05/02/19   [provider]  XTAMPZA ER 9 MG C12A Take 9 mg by mouth 2 (two) times daily. Patient not taking: Reported on 01/10/2022 04/09/21   [provider]      Allergies    Insulins, Ibuprofen, Pork-derived products, Shrimp [shellfish allergy], Tylenol [acetaminophen], and Lisinopril    Review of Systems   Review of Systems   Gastrointestinal:  Positive for abdominal pain.    Physical Exam Updated Vital Signs BP (!) 149/105   Pulse 89   Temp 98.6 F (37 C)   Resp (!) 26   SpO2 94%  Physical Exam  ED Results / Procedures / Treatments   Labs (all labs ordered are listed, but only abnormal results are displayed) Labs Reviewed  COMPREHENSIVE METABOLIC PANEL - Abnormal; Notable for the following components:      Result Value   Potassium 3.3 (*)    Glucose, Bld 100 (*)    All other components within normal limits  URINALYSIS, ROUTINE W REFLEX MICROSCOPIC - Abnormal; Notable for the following components:   APPearance CLOUDY (*)    All other components within normal limits  I-STAT VENOUS BLOOD GAS, ED - Abnormal; Notable for the following components:   pH, Ven 7.463 (*)    pCO2, Ven 43.9 (*)    pO2, Ven 166 (*)    Bicarbonate  31.5 (*)    TCO2 33 (*)    Acid-Base Excess 7.0 (*)    Potassium 3.3 (*)    Calcium, Ion 1.12 (*)    All other components within normal limits  RESP PANEL BY RT-PCR (RSV, FLU A&B, COVID)  [id number]  LIPASE, BLOOD  CBC  CBG MONITORING, ED    EKG None  Radiology No results found.  Procedures Procedures    Medications Ordered in ED Medications  ipratropium-albuterol (DUONEB) 0.5-2.5 (3) MG/3ML nebulizer solution 3 mL (has no administration in time range)  methylPREDNISolone sodium succinate (SOLU-MEDROL) 125 mg/2 mL injection 125 mg (has no administration in time range)  sodium chloride 0.9 % bolus 1,000 mL (1,000 mLs Intravenous New Bag/Given 01/15/22 0557)    ED Course/ Medical Decision Making/ A&P Clinical Course as of 01/15/22 0856  Sat Jan 15, 2022  0727 DG Chest Portable 1 View Visualized and interpreted 1 view at chest x-ray.  I compared today's images image from 4 days ago and 1 month ago.  Radiology is reading multifocal pneumonia.  Comparatively patient's chest x-ray does not look significantly different from previous x-rays.  I believe his lung markings are  increased secondary to poor inspiratory effort in the setting of wheezing and poor air movement.  Do not think this represents new multifocal pneumonia. [AH]    Clinical Course User Index [AH] Arthor Captain, PA-C                           Medical Decision Making This patient presents to the ED for concern of sob., wheezing and lower abdomin, this involves an extensive number of treatment options, and is a complaint that carries with it a high risk of complications and morbidity.  The emergent differential diagnosis for shortness of breath includes, but is not limited to, Pulmonary edema, bronchoconstriction, Pneumonia, Pulmonary embolism, Pneumotherax/ Hemothorax, Dysrythmia, ACS.        Co morbidities that complicate the patient evaluation       Sarcoid COPD   Additional history obtained:  Mother at bedside, review of EMR    Lab Tests:  I Ordered, and personally interpreted labs.  The pertinent results include:   Negative respiratory panel, VBG shows respiratory alkalosis CMP with mild hyper-K likely after neb treatment   Imaging Studies ordered:  I ordered imaging studies including chest x-ray As discussed in ED course   Cardiac Monitoring/ECG:       The patient was maintained on a cardiac monitor.  I personally viewed and interpreted the cardiac monitored which showed an underlying rhythm of: Sinus rhythm      Medicines ordered and prescription drug management:  I ordered medication including Medications sodium chloride 0.9 % bolus 1,000 mL (0 mLs Intravenous Stopped 01/15/22 0708) ipratropium-albuterol (DUONEB) 0.5-2.5 (3) MG/3ML nebulizer solution 3 mL (3 mLs Nebulization Given 01/15/22 0710) methylPREDNISolone sodium succinate (SOLU-MEDROL) 125 mg/2 mL injection 125 mg (125 mg Intravenous Given 01/15/22 0710) for wheezing Reevaluation of the patient after these medicines showed that the patient improved I have reviewed the patients home medicines and have  made adjustments as needed   Test Considered:       CT chest   Critical Interventions:       Neb treatment   Consultations Obtained:      Problem List / ED Course:       (R11.10) Post-tussive emesis  (primary encounter diagnosis)  (R06.2) Wheezing     Reevaluation:  After  the interventions noted above, I reevaluated the patient and found that they have :   Social Determinants of Health:       History of smoking   Dispostion:  After consideration of the diagnostic results and the patients response to treatment, I feel that the patent would benefit from discharge.  I do not think that the patient has community-acquired pneumonia and do not think he would benefit from antibiotics at this time..    Amount and/or Complexity of Data Reviewed Labs: ordered. Radiology: ordered. Decision-making details documented in ED Course.  Risk OTC drugs. Prescription drug management.          Final Clinical Impression(s) / ED Diagnoses Final diagnoses:  Post-tussive emesis  Wheezing    Rx / DC Orders ED Discharge Orders     None         Arthor CaptainHarris, Udell Mazzocco, PA-C 01/15/22 1506    Virgina NorfolkCuratolo, Adam, DO 01/16/22 [phone]

## 2022-01-15 NOTE — ED Provider Triage Note (Signed)
Emergency Medicine Provider Triage Evaluation Note  Glenn [name] , a 61 y.o. male  was evaluated in triage.  Pt complains of coughing since 3 am, with post-tussive emesis and lower abdominal pain. Hx of COPD, recent admission for same.   Review of Systems  Positive: As above Negative: CP, D, fevers  Physical Exam  BP (!) 149/105   Pulse 89   Temp 98.6 F (37 C)   Resp (!) 26   SpO2 94%  Gen:   Awake, no distress   Resp:  Tachypneic, increased work of breathing, decreased air movement throughout the lung fields bilaterally, wheezing throughout MSK:   Moves extremities without difficulty  Other:  Mildly tachycardic with regular rhythm  Medical Decision Making  Medically screening exam initiated at 6:43 AM.  Appropriate orders placed.  Renard Caperton Hanford was informed that the remainder of the evaluation will be completed by another provider, this initial triage assessment does not replace that evaluation, and the importance of remaining in the ED until their evaluation is complete.  This chart was dictated using voice recognition software, Dragon. Despite the best efforts of this provider to proofread and correct errors, errors may still occur which can change documentation meaning.    Paris Lore, PA-C 01/15/22 [phone]

## 2022-01-15 NOTE — Discharge Instructions (Signed)
Get help right a way if you have the faculty breathing and or having no response with your home medications, severe or with worsening lower abdominal pain, new fevers, confusion or increased weakness and lethargy.

## 2022-01-15 NOTE — ED Triage Notes (Signed)
Patient reports pain across abdomen this morning with emesis , no fever or diarrhea .

## 2022-01-18 ENCOUNTER — Ambulatory Visit (INDEPENDENT_AMBULATORY_CARE_PROVIDER_SITE_OTHER): Payer: Medicaid Other | Admitting: Internal Medicine

## 2022-01-18 ENCOUNTER — Encounter: Payer: Self-pay | Admitting: Internal Medicine

## 2022-01-18 ENCOUNTER — Ambulatory Visit (HOSPITAL_COMMUNITY): Payer: Medicaid Other

## 2022-01-18 VITALS — BP 136/82 | HR 89 | Temp 98.2°F | Ht 65.0 in | Wt 148.0 lb

## 2022-01-18 DIAGNOSIS — J449 Chronic obstructive pulmonary disease, unspecified: Secondary | ICD-10-CM

## 2022-01-18 MED ORDER — STIOLTO RESPIMAT 2.5-2.5 MCG/ACT IN AERS: 2.0000 | INHALATION_SPRAY | Freq: Every day | RESPIRATORY_TRACT | 0 refills | Status: DC

## 2022-01-18 NOTE — Progress Notes (Signed)
Discharge Progress Report  Patient Details  Name: Glenn [name] MRN: [id number] Date of Birth: [birth year] Referring Provider:   Doristine Devoid Pulmonary Rehab Walk Test from 12/20/2021 in [facility]  Referring Provider Wert        Number of Visits: 0  Reason for Discharge:  Hospitalization and illness  Smoking History:  Social History   Tobacco Use  Smoking Status Former   Packs/day: 1.00   Years: 34.00   Total pack years: 34.00   Types: Cigarettes   Quit date: 05/11/2020   Years since quitting: 1.6   Passive exposure: Never  Smokeless Tobacco Never  Tobacco Comments   at the most smoked 6-7 cigarettes/day    Diagnosis:  Stage 2 moderate COPD by GOLD classification (HCC)  ADL UCSD:  Pulmonary Assessment Scores     Row Name 12/20/21 1100         ADL UCSD   ADL Phase Entry     SOB Score total 45       CAT Score   CAT Score 15       mMRC Score   mMRC Score 2              Initial Exercise Prescription:  Initial Exercise Prescription - 12/20/21 1200       Date of Initial Exercise RX and Referring Provider   Date 12/20/21    Referring Provider Wert    Expected Discharge Date 02/24/22      Recumbant Elliptical   Level 1    Minutes 15    METs 2      Track   Minutes 15    METs 2      Prescription Details   Frequency (times per week) 2    Duration Progress to 30 minutes of continuous aerobic without signs/symptoms of physical distress      Intensity   THRR 40-80% of Max Heartrate 63-126    Ratings of Perceived Exertion 11-13    Perceived Dyspnea 0-4      Progression   Progression Continue to progress workloads to maintain intensity without signs/symptoms of physical distress.      Resistance Training   Training Prescription Yes    Weight blue bands    Reps 10-15             Discharge Exercise Prescription (Final Exercise Prescription Changes):   Functional  Capacity:  6 Minute Walk     Row Name 12/20/21 1202         6 Minute Walk   Phase Initial     Distance 1100 feet     Walk Time 6 minutes     # of Rest Breaks 0     MPH 2.08     METS 3.15     RPE 11     Perceived Dyspnea  1     VO2 Peak 11.02     Symptoms No     Resting HR 99 bpm     Resting BP 130/78     Resting Oxygen Saturation  96 %     Exercise Oxygen Saturation  during 6 min walk 86 %     Max Ex. HR 100 bpm     Max Ex. BP 140/78     2 Minute Post BP 120/80       Interval HR   1 Minute HR 100     2 Minute HR 100  3 Minute HR 98     4 Minute HR 97     5 Minute HR 97     6 Minute HR 100     2 Minute Post HR 100     Interval Heart Rate? Yes       Interval Oxygen   Interval Oxygen? Yes     Baseline Oxygen Saturation % 96 %     1 Minute Oxygen Saturation % 93 %     1 Minute Liters of Oxygen 0 L     2 Minute Oxygen Saturation % 89 %     2 Minute Liters of Oxygen 0 L     3 Minute Oxygen Saturation % 87 %     3 Minute Liters of Oxygen 0 L     4 Minute Oxygen Saturation % 86 %     4 Minute Liters of Oxygen 0 L     5 Minute Oxygen Saturation % 88 %     5 Minute Liters of Oxygen 0 L     6 Minute Oxygen Saturation % 89 %     6 Minute Liters of Oxygen 0 L     2 Minute Post Oxygen Saturation % 99 %     2 Minute Post Liters of Oxygen 0 L              Psychological, QOL, Others - Outcomes: PHQ 2/9:    12/20/2021   10:51 AM 08/06/2020   10:19 AM  Depression screen PHQ 2/9  Decreased Interest 0 0  Down, Depressed, Hopeless 0 0  PHQ - 2 Score 0 0  Altered sleeping 3   Tired, decreased energy 0   Change in appetite 0   Feeling bad or failure about yourself  0   Trouble concentrating 0   Moving slowly or fidgety/restless 0   Suicidal thoughts 0   PHQ-9 Score 3   Difficult doing work/chores Somewhat difficult     Quality of Life:   Personal Goals: Goals established at orientation with interventions provided to work toward goal.  Personal Goals and  Risk Factors at Admission - 12/20/21 1058       Core Components/Risk Factors/Patient Goals on Admission   Improve shortness of breath with ADL's Yes    Intervention Provide education, individualized exercise plan and daily activity instruction to help decrease symptoms of SOB with activities of daily living.    Expected Outcomes Short Term: Improve cardiorespiratory fitness to achieve a reduction of symptoms when performing ADLs;Glenn [name] Term: Be able to perform more ADLs without symptoms or delay the onset of symptoms              Personal Goals Discharge:  Goals and Risk Factor Review     Row Name 12/22/21 0815             Core Components/Risk Factors/Patient Goals Review   Personal Goals Review Improve shortness of breath with ADL's;Develop more efficient breathing techniques such as purse lipped breathing and diaphragmatic breathing and practicing self-pacing with activity.       Review Glenn [name] is scheduled to begin exercise next week.       Expected Outcomes See admission goals.                Exercise Goals and Review:  Exercise Goals     Row Name 12/20/21 1102 12/22/21 0810           Exercise Goals   Increase Physical Activity Yes Yes  Intervention Provide advice, education, support and counseling about physical activity/exercise needs.;Develop an individualized exercise prescription for aerobic and resistive training based on initial evaluation findings, risk stratification, comorbidities and participant's personal goals. Provide advice, education, support and counseling about physical activity/exercise needs.;Develop an individualized exercise prescription for aerobic and resistive training based on initial evaluation findings, risk stratification, comorbidities and participant's personal goals.      Expected Outcomes Short Term: Attend rehab on a regular basis to increase amount of physical activity.;Standing Term: Add in home exercise to make exercise part of  routine and to increase amount of physical activity.;Glenn [name] Term: Exercising regularly at least 3-5 days a week. Short Term: Attend rehab on a regular basis to increase amount of physical activity.;Glenn [name] Term: Add in home exercise to make exercise part of routine and to increase amount of physical activity.;Glenn [name] Term: Exercising regularly at least 3-5 days a week.      Increase Strength and Stamina Yes Yes      Intervention Provide advice, education, support and counseling about physical activity/exercise needs.;Develop an individualized exercise prescription for aerobic and resistive training based on initial evaluation findings, risk stratification, comorbidities and participant's personal goals. Provide advice, education, support and counseling about physical activity/exercise needs.;Develop an individualized exercise prescription for aerobic and resistive training based on initial evaluation findings, risk stratification, comorbidities and participant's personal goals.      Expected Outcomes Short Term: Increase workloads from initial exercise prescription for resistance, speed, and METs.;Short Term: Perform resistance training exercises routinely during rehab and add in resistance training at home;Glenn [name] Term: Improve cardiorespiratory fitness, muscular endurance and strength as measured by increased METs and functional capacity ( ) Short Term: Increase workloads from initial exercise prescription for resistance, speed, and METs.;Short Term: Perform resistance training exercises routinely during rehab and add in resistance training at home;Glenn [name] Term: Improve cardiorespiratory fitness, muscular endurance and strength as measured by increased METs and functional capacity ( )      Able to understand and use rate of perceived exertion (RPE) scale Yes Yes      Intervention Provide education and explanation on how to use RPE scale Provide education and explanation on how to use RPE scale      Expected Outcomes  Short Term: Able to use RPE daily in rehab to express subjective intensity level;Glenn [name] Term:  Able to use RPE to guide intensity level when exercising independently Short Term: Able to use RPE daily in rehab to express subjective intensity level;Glenn [name] Term:  Able to use RPE to guide intensity level when exercising independently      Able to understand and use Dyspnea scale Yes Yes      Intervention Provide education and explanation on how to use Dyspnea scale Provide education and explanation on how to use Dyspnea scale      Expected Outcomes Short Term: Able to use Dyspnea scale daily in rehab to express subjective sense of shortness of breath during exertion;Glenn [name] Term: Able to use Dyspnea scale to guide intensity level when exercising independently Short Term: Able to use Dyspnea scale daily in rehab to express subjective sense of shortness of breath during exertion;Glenn [name] Term: Able to use Dyspnea scale to guide intensity level when exercising independently      Knowledge and understanding of Target Heart Rate Range (THRR) Yes Yes      Intervention Provide education and explanation of THRR including how the numbers were predicted and where they are located for reference Provide education and explanation of THRR including how the numbers were  predicted and where they are located for reference      Expected Outcomes Short Term: Able to state/look up THRR;Glenn [name] Term: Able to use THRR to govern intensity when exercising independently;Short Term: Able to use daily as guideline for intensity in rehab Short Term: Able to state/look up THRR;Glenn [name] Term: Able to use THRR to govern intensity when exercising independently;Short Term: Able to use daily as guideline for intensity in rehab      Understanding of Exercise Prescription Yes Yes      Intervention Provide education, explanation, and written materials on patient's individual exercise prescription Provide education, explanation, and written materials on patient's  individual exercise prescription      Expected Outcomes Short Term: Able to explain program exercise prescription;Glenn [name] Term: Able to explain home exercise prescription to exercise independently Short Term: Able to explain program exercise prescription;Glenn [name] Term: Able to explain home exercise prescription to exercise independently               Exercise Goals Re-Evaluation:  Exercise Goals Re-Evaluation     Row Name 12/22/21 0812             Exercise Goal Re-Evaluation   Exercise Goals Review Increase Physical Activity;Knowledge and understanding of Target Heart Rate Range (THRR);Increase Strength and Stamina;Able to understand and use rate of perceived exertion (RPE) scale;Understanding of Exercise Prescription;Able to understand and use Dyspnea scale       Comments Glenn [name] is scheduled to begin exercise next week. Will continue to monitor and progress as able.       Expected Outcomes Through exercise at rehab and home, the patient will decrease shortness of breath with daily activities and feel confident in carrying out an exercise regimen at home.                Nutrition & Weight - Outcomes:  Pre Biometrics - 12/20/21 1033       Pre Biometrics   Grip Strength 29 kg              Nutrition:   Nutrition Discharge:   Education Questionnaire Score:  Knowledge Questionnaire Score - 12/20/21 1222       Knowledge Questionnaire Score   Pre Score 14/18             Goals reviewed with patient; copy given to patient.

## 2022-01-18 NOTE — Patient Instructions (Addendum)
Plan A = Automatic = Always=    symbicort 160 2 first thing in am / chase with spriiva 2.5 x 2 puff and 12 hours later symbicort 160 x 2 puffs  Work on inhaler technique:  relax and gently blow all the way out then take a nice smooth full deep breath back in, triggering the inhaler at same time you start breathing in.  Hold breath in for at least  5 seconds if you can. Blow out symbicort thru nose. Rinse and gargle with water when done.  If mouth or throat bother you at all,  try brushing teeth/gums/tongue with arm and hammer toothpaste/ make a slurry and gargle and spit out.      Plan B = Backup (to supplement plan A, not to replace it) Only use your albuterol inhaler as a rescue medication to be used if you can't catch your breath by resting or doing a relaxed purse lip breathing pattern.  - The less you use it, the better it will work when you need it. - Ok to use the inhaler up to 2 puffs  every 4 hours if you must but call for appointment if use goes up over your usual need - Don't leave home without it !!  (think of it like the spare tire for your car)   Plan C = Crisis (instead of Plan B but only if Plan B stops working) - only use your albuterol nebulizer if you first try Plan B and it fails to help > ok to use the nebulizer up to every 4 hours but if start needing it regularly call for immediate appointment    For cough > mucinex 1200 mg every 12 hours and take  oxycodone up to every 4 hours  or hydrocodone every 4 hours as needed    Please schedule a follow up office visit in 6 weeks, call sooner if needed with all medications /inhalers/ solutions in hand so we can verify exactly what you are taking. This includes all medications from all doctors and over the counters - PLEASE separate them into two bags:  the ones you take automatically, no matter what, vs the ones you take just when you feel you need them "BAG #2 is UP TO YOU"  - this will really help [place] help you take your medications  more effectively.

## 2022-01-18 NOTE — Progress Notes (Unsigned)
Subjective:     Patient ID: Glenn [name], male   DOB: 12-04-1960     MRN: [id number]      Brief patient profile:  61 yobm quit smoking 09/2020   with onset of symptoms in 1995 while in [place] dx as asthma vs sarcoid and rx pred/ theoph / albuterol seen by allergist dx as pollen moved to [place] where underwent bilateral lung surgery 2011 ? Lung vol reduction ? Helped breathing at the time but uses HC parking and  [facility] = can't walk a nl pace on a flat grade s sob but does fine slow and flat so referred to pulmonary clinic 10/05/2016 by Dr  Jackson Latino      History of Present Illness  10/05/2016 1st [facility] office visit/ Glenn [name]   Chief Complaint  Patient presents with   Pulmonary Consult    Referred by Dr. Haskel Schroeder for eval of Asthma and COPD. Pt states he was dxed with COPD and Asthma in 1995. He moved here a year ago from [place], [place]. He states his breathing is "okay" today. He states he has a "slight cold"- prod cough with minimal brown sputum.    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  Work on Orthoptist B = Backup Only use your albuterol (proair) as a rescue medication Plan C = Crisis - only use your albuterol nebulizer if you first try Plan B and it fails to help > ok to use the nebulizer up to every 4 hours but if start needing it regularly call for immediate appointment Please schedule a follow up office visit in 6 weeks, call sooner if needed with pfts    01/15/2020  f/u ov/Glenn [name] re:  Copd II / still  smoking / no really on maint symb/ doesn't remember rx with spiriva previously  Chief Complaint  Patient presents with   Follow-up    Breathing is overall doing well. He states that he uses his albuterol inhaler about every 4 hours and uses neb every night.   Dyspnea:  MMRC2 = can't walk a nl pace on a flat grade s sob but does fine slow and flat  Cough: none  Sleeping: 3 h p lie down needs nebulizer  SABA use: way too much /totally confused on maint vs prns 02: none  Prednisone always helps and when it does he tends to stop everything not understanding the difference between maint and prns Rec Plan A = Automatic = Always=    symbicort 160 Take 2 puffs first thing in am and then another 2 puffs about 12 hours later and spiriva 2 pffs each am  Prednisone 10 mg take  4 each am x 2 days,   2 each am x 2 days,  1 each am x 2 days and stop   Plan B = Backup (to supplement plan A,  not to replace it) Only use your albuterol inhaler as a rescue medication Plan C = Crisis (instead of Plan B but only if Plan B stops working) - only use your albuterol nebulizer if you first try Plan B and it fails to help > ok to use the nebulizer up to every 4 hours but if start needing it regularly call for immediate appointment The key is to stop smoking completely before smoking completely stops you! I very strongly recommend you get the moderna or pfizer vaccine  If you are satisfied with your treatment plan,  let your doctor know and he/she can either refill your medications or you can return here when your prescription runs out but you must bring all active medications with you if you want my help.  NP recs  06/09/20   HST/pfts    08/16/2021  f/u ov/Glenn [name] re: GOLD  2  maint on symb/spiriva  and lots of saba p exertion (not the instruction given)  Chief Complaint  Patient presents with   Follow-up    Pt states his breathing is good but he has a  sore throat.   Dyspnea: MMRC2 = can't walk a nl pace on a flat  grade s sob but does fine slow and flat   Cough: minimal dry  Sleeping: 30 degrees electric bed  SABA use: none / nebulizer  once or twice daily never prechallenges or rechallenges  02: none  Covid status:  never vax  Prednisone always helps breathing transiently/ interested in pulm rehab  Rec  Prednisone 10 mg take  4 each am x 2 days,   2 each am x 2 days,  1 each am x 2 days and stop  Plan A = Automatic = Always=    symbicort 160  Take 2 puffs first thing in am and then another 2 puffs about 12 hours later. Spiriva is 2 pffs after the symbicort Work on inhaler technique:   Plan B = Backup (to supplement plan A, not to replace it) Only use your albuterol inhaler as a rescue medication  Plan C = Crisis (instead of Plan B but only if Plan B stops working) - only use your albuterol nebulizer if you first try Plan B  Ok to try albuterol 15 min before an activity (on alternating days remembering the nebulizer is 8 x stronger)  that you know would usually make you short of breath  My office will be contacting you by phone for referral to pulmonary rehab > did not happen   Admit 09/29/21 and 11/17/21  and tussionex made the difference   11/22/2021  f/u ov/Glenn [name] re: GOLD 2   maint on symbicort/ spiriva / new globus sensation worse off tussionex  Chief Complaint  Patient presents with   Follow-up    Inpatient [facility]. Wheezing.  D/C'd 11/19/21  Dyspnea:  MMRC2 = can't walk a nl pace on a flat grade s sob but does fine slow and flat   Cough: sense of st/ globus / using mints and prn pm ppi  Sleeping: sleep 30 degrees SABA use: none on day of ov/ needed neb 11/21/21 p carrying heavy object  02: none  Covid status:   never  Lung cancer screening :  in program   Rec Plan A = Automatic = Always=    Symbiocrt 160 Take 2 puffs first thing in am and then another 2 puffs about 12 hours later and spiriva 2 puffs in am Practice with spiriva to get  a much deeper breath for all your devices   Pantoprazole (protonix) 40 mg   Take  30-60 min before first meal of the day and Pepcid (famotidine)  20 mg after supper until return to office Jolley ranchers were great - don't use mint / menthol or chocalate  Plan B = Backup (to supplement plan A, not to replace it) Only use your albuterol inhaler as a rescue medication  Plan C = Crisis (instead of Plan B but only if Plan B stops working) - only use your albuterol nebulizer if you first try Plan B  Ok to try albuterol 15 min before an activity (on alternating days)  that you know would usually make you short of breath My office will be contacting you by phone for referral to ENT and pulmonary rehab   Please schedule a follow up office visit in 6 weeks, call sooner if needed with all medications /inhalers/ solutions in hand   Admit date: 01/10/2022 Discharge date: 01/13/2022  Admitted From: Home Discharge disposition: Home with home oxygen  Recommendations at discharge:  Continue Tamiflu and doxycycline at home to complete 5-day course. Continue tapering course of prednisone at home Continue scheduled and as needed antitussives Home oxygen ordered Brief narrative: RONEN BROMWELL is a 61 y.o. male with PMH significant for DM2, HTN, HLD, nonhemorrhagic CVA, COPD, anxiety/depression, arthritis, chronic pain, sarcoidosis. 12/4, patient presented to the ED with complaint of progressively worsening dyspnea, cough, wheezing. 2 weeks ago, he was exposed to some of his grandchildren who had mild viral illness.  He started having nonproductive cough and mild sore throat x 4days, he started having severe symptoms associated with myalgia, arthralgia, dyspnea, wheezing and significant increasing sputum production.   In the ED, patient was afebrile, heart rate elevated 124, breathing in low 20s, required 2 L oxygen by nasal cannula. Initial labs with unremarkable CBC, unremarkable CMP, normal lactic acid, negative procalcitonin level D-dimer slightly  elevated 0.58 Influenza A positive Chest x-ray was obtained and was compared with the x-ray from 10/11.  It did not show any new intrathoracic abnormality but showed persistent bibasilar and left perihilar atelectasis versus consolidation.  Persistent elevation of left hemidiaphragm with postoperative changes from left lung surgery.   Admitted to [facility] Assessment and plan: Influenza A Acute respiratory failure with hypoxia He was started on a 5-day course of Tamiflu 75 mg twice daily and doxycycline for anti-inflammatory properties as well as IV steroids. Wheezing, cough significantly improved Continue Tamiflu and doxycycline at home to complete 5-day course. Continue tapering course of prednisone at home Continue scheduled and as needed antitussives   Qualified for home oxygen on ambulation Patient Saturations on Room Air at Rest = spO2 92% Patient Saturations on Room Air while Ambulating = sp02 88% .  Rested and performed pursed lip breathing for 1 minute with sp02 at 89%. Patient Saturations on 1 Liters of oxygen while Ambulating = sp02 94% At end of testing pt left in room on 1 Liters of oxygen.   COPD with acute exacerbation History of left lung lobectomy Acute flareup of COPD due to flu. Continue steroids as above.  Continue bronchodilators as before.     Type 2 diabetes mellitus with hyperglycemia A1c 7.5 on 01/10/2022 PTA on metformin 500 mg twice daily. Can resume at discharge.   Essential hypertension PTA on amlodipine 10 mg daily, losartan 100 mg daily, HCTZ 12.5 mg daily  Continue all    History of nonhemorrhagic CVA Hyperlipidemia Continue Plavix, atorvastatin 40  mg p.o. daily.   BPH (benign prostatic hyperplasia)  Continue tamsulosin 0.4 mg p.o. daily.   Chronic pain Continue oxycodone 20 mg 4 times a day as needed. Bowel regimen to continue with Amitiza PRN    01/18/2022  f/u ov/Glenn [name] re: GOLD 2  maint on symbicort 160  / spiriva one (rec was 2) puffs each  am Chief Complaint  Patient presents with   Follow-up    Pt states he was in hospital for about 3 days since LOV. Pt was discharged with Oxygen but has not used it.   Dyspnea:  not pushing buggy, riding scooter instead / no longer doing PT Cough: minimal rattling but can be severe and worse at bedtime / nothing purulent now  Sleeping: 30 degrees bed  SABA use: twice daily neb  02: not using      No obvious day to day or daytime variability or assoc excess/ purulent sputum or mucus plugs or hemoptysis or cp or chest tightness, subjective wheeze or overt sinus or hb symptoms.     Also denies any obvious fluctuation of symptoms with weather or environmental changes or other aggravating or alleviating factors except as outlined above   No unusual exposure hx or h/o childhood pna/ asthma or knowledge of premature birth.  Current Allergies, Complete Past Medical History, Past Surgical History, Family History, and Social History were reviewed in [employer] record.  ROS  The following are not active complaints unless bolded Hoarseness, sore throat, dysphagia, dental problems, itching, sneezing,  nasal congestion or discharge of excess mucus or purulent secretions, ear ache,   fever, chills, sweats, unintended wt loss or wt gain, classically pleuritic or exertional cp,  orthopnea pnd or arm/hand swelling  or leg swelling, presyncope, palpitations, abdominal pain, anorexia, nausea, vomiting, diarrhea  or change in bowel habits or change in bladder habits, change in stools or change in urine, dysuria, hematuria,  rash, arthralgias, visual complaints, headache, numbness, weakness or ataxia or problems with walking or coordination,  change in mood or  memory.        Current Meds - - NOTE:   Unable to verify as accurately reflecting what pt takes    Medication Sig   albuterol (PROAIR HFA) 108 (90 Base) MCG/ACT inhaler 2 puffs every 4 hours as needed only  if your can't catch your  breath (Patient taking differently: Inhale 2 puffs into the lungs every 6 (six) hours as needed for shortness of breath.)   albuterol (PROVENTIL) (2.5 MG/3ML) 0.083% nebulizer solution Take 3 mLs (2.5 mg total) by nebulization every 4 (four) hours as needed for wheezing or shortness of breath.   AMITIZA 24 MCG capsule Take 24 mcg by mouth daily as needed for constipation.   amLODipine (NORVASC) 10 MG tablet Take 1 tablet (10 mg total) by mouth daily.   Ascorbic Acid (VITAMIN C PO) Take 2 tablets by mouth daily.   atorvastatin (LIPITOR) 40 MG tablet Take 1 tablet (40 mg total) by mouth daily.   benzonatate (TESSALON) 200 MG capsule Take 1 capsule (200 mg total) by mouth 3 (three) times daily for 5 days.   budesonide-formoterol (SYMBICORT) 160-4.5 MCG/ACT inhaler Inhale 2 puffs into the lungs 2 (two) times daily.   clopidogrel (PLAVIX) 75 MG tablet Take 75 mg by mouth daily.   Dextromethorphan HBr 15 MG TABS 30 mg orally every 6 to 8 hours; MAX, 120 mg/day   famotidine (PEPCID) 20 MG tablet One after supper (Patient taking differently: Take 20  mg by mouth daily.)   fluticasone (FLONASE) 50 MCG/ACT nasal spray Place 2 sprays into both nostrils daily.   gabapentin (NEURONTIN) 300 MG capsule Take 1 capsule (300 mg total) by mouth 3 (three) times daily. (Patient taking differently: Take 600 mg by mouth daily.)   loratadine (CLARITIN) 10 MG tablet Take 1 tablet (10 mg total) by mouth daily.   losartan-hydrochlorothiazide (HYZAAR) 100-12.5 MG tablet Take 1 tablet by mouth daily.   metFORMIN (GLUCOPHAGE) 500 MG tablet Take 1 tablet (500 mg total) by mouth 2 (two) times daily with a meal.   methylPREDNISolone (MEDROL DOSEPAK) 4 MG TBPK tablet Use as directed   montelukast (SINGULAIR) 10 MG tablet TAKE 1 TABLET BY MOUTH EVERYDAY AT BEDTIME (Patient taking differently: Take 10 mg by mouth daily as needed (allergies).)   naloxone (NARCAN) nasal spray 4 mg/0.1 mL SMARTSIG:Both Nares   nitroGLYCERIN (NITROSTAT)  0.4 MG SL tablet Place 0.4 mg under the tongue every 5 (five) minutes as needed for chest pain.   Oxycodone HCl 20 MG TABS Take 20 mg by mouth 4 (four) times daily.   pantoprazole (PROTONIX) 40 MG tablet Take 1 tablet (40 mg total) by mouth daily.   predniSONE (DELTASONE) 10 MG tablet Take 4 tablets daily X 2 days, then, Take 3 tablets daily X 2 days, then, Take 2 tablets daily X 2 days, then, Take 1 tablets daily X 1 day.   promethazine-dextromethorphan (PROMETHAZINE-DM) 6.25-15 MG/5ML syrup Take 15 mLs by mouth every 12 (twelve) hours.   sildenafil (VIAGRA) 100 MG tablet Take 1 tablet (100 mg total) by mouth daily as needed. (Patient taking differently: Take 100 mg by mouth daily as needed for erectile dysfunction.)   tamsulosin (FLOMAX) 0.4 MG CAPS capsule Take 0.4 mg by mouth daily.   Tiotropium Bromide Monohydrate 2.5 MCG/ACT AERS Inhale 2 each into the lungs daily.   Vitamin D, Ergocalciferol, (DRISDOL) 1.25 MG (50000 UNIT) CAPS capsule Take 50,000 Units by mouth once a week. Mondays   XTAMPZA ER 9 MG C12A Take 9 mg by mouth 2 (two) times daily.                       Objective:   Physical Exam   Wts  01/18/2022       148 11/22/2021      146   08/16/2021        149  11/11/2020        147  08/11/2020          144 01/15/2020        140 03/26/2019        144  11/20/2018      144  08/14/2018          144  02/08/2018          143  11/22/17 142 lb (64.4 kg)  10/05/16 131 lb 12.8 oz (59.8 kg)    Vital signs reviewed  01/18/2022  - Note at rest 02 sats  98% on RA   General appearance:    chronically ill, mildly rattling cough on voluntary cough maneuver    HEENT :  Oropharynx  clear      NECK :  without JVD/Nodes/TM/ nl carotid upstrokes bilaterally   LUNGS: no acc muscle use,  Mod barrel  contour chest wall with bilateral  Distant exp  wheeze and  without cough on insp or exp maneuvers and mod  Hyperresonant  to  percussion bilaterally  CV:  RRR  no s3 or murmur or  increase in P2, and no edema   ABD:  soft and nontender with pos mid insp Hoover's  in the supine position. No bruits or organomegaly appreciated, bowel sounds nl  MS:   Ext warm without deformities or   obvious joint restrictions , calf tenderness, cyanosis or clubbing  SKIN: warm and dry without lesions    NEURO:  alert, approp, nl sensorium with  no motor or cerebellar deficits apparent.           I personally reviewed images and agree with radiology impression as follows:   Chest CT 10/16/21 LDSCT: Biapical pleuroparenchymal scarring. Centrilobular and paraseptal emphysema. Perihilar architectural distortion and bronchiectasis, as before. Bilateral postoperative scarring. Image quality in the lung bases is degraded by respiratory motion. Calcified granulomas.  Adherent debris in the airway.    I personally reviewed images and agree with radiology impression as follows:  CXR:   portable 01/15/22 Biapical pleuroparenchymal scarring. Centrilobular and paraseptal emphysema. Perihilar architectural distortion and bronchiectasis, as before. Bilateral postoperative scarring. Image quality in the lung bases is degraded by respiratory motion. Calcified granulomas. No suspicious pulmonary nodules. No pleural fluid. Adherent debris in the airway. .  Assessment:

## 2022-01-18 NOTE — Addendum Note (Signed)
Encounter addended by: Essie Hart, RN on: 01/18/2022 3:42 PM  Actions taken: Clinical Note Signed

## 2022-01-19 ENCOUNTER — Telehealth: Payer: Self-pay | Admitting: Internal Medicine

## 2022-01-19 ENCOUNTER — Encounter: Payer: Self-pay | Admitting: Internal Medicine

## 2022-01-19 NOTE — Assessment & Plan Note (Addendum)
Quit smoking 05/2020    - Spirometry 10/05/2016  FEV1 1.36 (52%)  Ratio 65 p saba with classic curvature - 10/05/2016  > try dulera 200 2bid   - Allergy profile 11/22/2017 >  Eos 0.6 /  IgE  25 RAST neg x for roaches/ neg for mold  - 11/22/2017   continue dulera 200 2bid  - PFT's  02/08/2018  FEV1 0.90 (36 % ) ratio 59   p multiple saba prior to study with DLCO  52 % corrects to 106  % for alv volume   02/08/2018  After extensive coaching inhaler device,  effectiveness =    90% from a baseline of 75% with smi so try spiriva respimat - 08/14/2018    changed to trelegy but preferred symb/spriva - 11/20/2018     Breztri > better but not covered by medicaid - 03/26/2019  After extensive coaching inhaler device,  effectiveness =    75% (short Ti) > rechallenge with Breztri > not covered, resumed symb 160 but only took prn - 01/15/2020 re-started on symb 160/spiriva 2.5 daily  - 08/11/2020  After extensive coaching inhaler device,  effectiveness =    75% > continue symbicort/ spiriva - PFT's  08/11/2020  FEV1 1.34 (54 % ) ratio 0.61  p 10 % improvement from saba p spiriva prior to study with DLCO  11.94 (52%) corrects to 3.41 (77%)  for alv volume and FV curve classic concavity   - 11/22/2021   continue symb/spiriva add max gerd rx and f/u in 6 weeks - 01/18/2022  After extensive coaching inhaler device,  effectiveness =  75% with SMI > continue spiriva but use the right dose = 2 pffs each am   He is not back to baseline yet and I strongly  doubt adherence at home but wife is present in office today and I went over each of his pulmonary medications using the ABC action plan he was supposed to be following along with rec to control cough with mucinex dm 1200 mg every 12 hours and supplement with hydrocodone cough syrup and train on flutter valve as well if he'll return with all meds in hand using a trust but verify approach to confirm accurate Medication  Reconciliation The principal here is that until we are certain that  the  patients are doing what we've asked, it makes no sense to ask them to do more.          Each maintenance medication was reviewed in detail including emphasizing most importantly the difference between maintenance and prns and under what circumstances the prns are to be triggered using an action plan format where appropriate.  Total time for H and P, chart review, counseling, reviewing hfa/smi/neb device(s) and generating customized AVS unique to this office visit / same day charting = 30 min transition of care visit

## 2022-01-19 NOTE — Telephone Encounter (Signed)
Called the pt and there was no answer- LMTCB    

## 2022-01-19 NOTE — Telephone Encounter (Signed)
Make sure he has flutter valve and see if we can move up his f/u ov to 2-3 weeks or as soon as I'm back in [place] p 1st of month

## 2022-01-20 ENCOUNTER — Ambulatory Visit (HOSPITAL_COMMUNITY): Payer: Medicaid Other

## 2022-01-20 NOTE — Telephone Encounter (Signed)
Spoke with the pt  He has a flutter valve  Will keep appt he has set for 03/01/22 and call sooner if needed

## 2022-01-25 ENCOUNTER — Ambulatory Visit (HOSPITAL_COMMUNITY): Payer: Medicaid Other

## 2022-01-27 ENCOUNTER — Ambulatory Visit (HOSPITAL_COMMUNITY): Payer: Medicaid Other

## 2022-01-28 ENCOUNTER — Other Ambulatory Visit (HOSPITAL_COMMUNITY): Payer: Self-pay

## 2022-02-01 ENCOUNTER — Ambulatory Visit (HOSPITAL_COMMUNITY): Payer: Medicaid Other

## 2022-02-03 ENCOUNTER — Ambulatory Visit (HOSPITAL_COMMUNITY): Payer: Medicaid Other

## 2022-02-08 ENCOUNTER — Ambulatory Visit (HOSPITAL_COMMUNITY): Payer: Medicaid Other

## 2022-02-10 ENCOUNTER — Ambulatory Visit (HOSPITAL_COMMUNITY): Payer: Medicaid Other

## 2022-02-15 ENCOUNTER — Ambulatory Visit (HOSPITAL_COMMUNITY): Payer: Medicaid Other

## 2022-02-17 ENCOUNTER — Ambulatory Visit (HOSPITAL_COMMUNITY): Payer: Medicaid Other

## 2022-02-22 ENCOUNTER — Ambulatory Visit (HOSPITAL_COMMUNITY): Payer: Medicaid Other

## 2022-02-24 ENCOUNTER — Ambulatory Visit (HOSPITAL_COMMUNITY): Payer: Medicaid Other

## 2022-02-28 NOTE — Progress Notes (Signed)
Subjective:     Patient ID: Glenn [name], male   DOB: 1960-07-04     MRN: [id number]     Brief patient profile:  89  yobm quit smoking 09/2020   with onset of symptoms in 1995 while in [place] dx as asthma vs sarcoid and rx pred/ theoph / albuterol seen by allergist dx as pollen moved to [place] where underwent bilateral lung surgery 2011 ? Lung vol reduction ? Helped breathing at the time but uses HC parking and  [facility] = can't walk a nl pace on a flat grade s sob but does fine slow and flat so referred to pulmonary clinic 10/05/2016 by Dr  Tyson Dense      History of Present Illness  10/05/2016 1st [facility] office visit/ Katoya Amato   Chief Complaint  Patient presents with   Pulmonary Consult    Referred by Dr. Alveta Heimlich for eval of Asthma and COPD. Pt states he was dxed with COPD and Asthma in 1995. He moved here a year ago from [place], [place]. He states his breathing is "okay" today. He states he has a "slight cold"- prod cough with minimal brown sputum.    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  Work on [employer] B = Backup Only use your albuterol (proair) as a rescue medication Plan C = Crisis - only use your albuterol nebulizer if you first try Plan B and it fails to help > ok to use the nebulizer up to every 4 hours but if start needing it regularly call for immediate appointment Please schedule a follow up office visit in 6 weeks, call sooner if needed with pfts    Admit 09/29/21 and 11/17/21  and tussionex made  the difference  11/22/2021  f/u ov/Laylanie Kruczek re: GOLD 2   maint on symbicort/ spiriva / new globus sensation worse off tussionex  Chief Complaint  Patient presents with   Follow-up    Inpatient at [facility]. Wheezing.  D/C'd 11/19/21  Dyspnea:  MMRC2 = can't walk a nl pace on a flat grade s sob but does fine slow and flat   Cough: sense of st/ globus / using mints and prn pm ppi  Sleeping: sleep 30 degrees SABA use: none on day of ov/ needed neb 11/21/21 p carrying heavy object  02: none  Covid status:   never  Lung cancer screening :  in program   Rec Plan A = Automatic = Always=    Symbiocrt 160 Take 2 puffs first thing in am and then another 2 puffs about 12 hours later and spiriva 2 puffs in am Practice with spiriva to get a much deeper breath for all your devices  Pantoprazole (protonix) 40 mg   Take  30-60 min before first meal of the day and Pepcid (famotidine)  20 mg after supper until return to office  Jolley ranchers were great - don't use mint / menthol or chocalate  Plan B = Backup (to supplement plan A, not to replace it) Only use your albuterol inhaler as a rescue medication  Plan C = Crisis (instead of Plan B but only if Plan B stops working) - only use your albuterol nebulizer if you first try [place] to try albuterol 15 min before an activity (on alternating days)  that you know would usually make you short of breath My office will be contacting you by phone for referral to ENT and pulmonary rehab   Please schedule a follow up office visit in 6 weeks, call sooner if needed with all medications /inhalers/ solutions in hand   Admit date: 01/10/2022 Discharge date: 01/13/2022  Admitted From: Home Discharge disposition: Home with home oxygen  Recommendations at discharge:  Continue Tamiflu and doxycycline at home to complete 5-day course. Continue tapering course of prednisone at home Continue scheduled and as needed antitussives Home oxygen ordered Brief narrative: Glenn [name] is a 62 y.o. male with PMH significant for DM2, HTN, HLD, nonhemorrhagic CVA, COPD, anxiety/depression, arthritis, chronic pain, sarcoidosis. 12/4, patient presented to the ED with complaint of progressively worsening dyspnea, cough, wheezing. 2 weeks ago, he was exposed to some of his grandchildren who had mild viral illness.  He started having nonproductive cough and mild sore throat x 4days, he started having severe symptoms associated with myalgia, arthralgia, dyspnea, wheezing and significant increasing sputum production.   In the ED, patient was afebrile, heart rate elevated 124, breathing in low 20s, required 2 L oxygen by nasal cannula. Initial labs with unremarkable CBC, unremarkable CMP, normal lactic acid, negative procalcitonin level D-dimer slightly elevated 0.58 Influenza A positive Chest x-ray was obtained and was compared with the x-ray from 10/11.  It did not show any new intrathoracic abnormality but showed persistent bibasilar and left perihilar atelectasis versus consolidation.  Persistent elevation of left hemidiaphragm with postoperative changes from left lung surgery.   Admitted to [facility] Assessment and plan: Influenza A Acute respiratory failure with hypoxia He was started on a 5-day course of Tamiflu 75 mg twice daily and doxycycline for anti-inflammatory properties as well as IV steroids. Wheezing, cough significantly improved Continue Tamiflu and doxycycline at home to complete 5-day course. Continue tapering course of prednisone at home Continue scheduled and as needed antitussives   Qualified for home oxygen on ambulation Patient Saturations on Room Air at Rest = spO2 92% Patient Saturations on Room Air while Ambulating = sp02 88% .  Rested and performed pursed lip breathing for 1 minute with sp02 at 89%. Patient Saturations on 1 Liters of oxygen while Ambulating = sp02 94% At end of testing pt left in room on 1 Liters of oxygen.   COPD with acute  exacerbation History of left lung lobectomy Acute flareup of COPD due to flu. Continue steroids as above.  Continue bronchodilators as before.     Type 2 diabetes mellitus with hyperglycemia A1c 7.5 on 01/10/2022 PTA on metformin 500 mg twice daily. Can resume at discharge.   Essential hypertension PTA on amlodipine 10 mg daily, losartan 100 mg daily, HCTZ 12.5 mg daily  Continue all    History of nonhemorrhagic CVA Hyperlipidemia Continue Plavix, atorvastatin 40 mg p.o. daily.   BPH (benign prostatic hyperplasia)  Continue tamsulosin 0.4 mg p.o. daily.   Chronic pain Continue oxycodone 20 mg 4 times a day as needed. Bowel regimen to continue with Amitiza PRN  01/18/2022  f/u ov/Romel Dumond re: GOLD 2  maint on symbicort 160  / spiriva one (rec was 2) puffs each am Chief Complaint  Patient presents with   Follow-up    Pt states he was in hospital for about 3 days since LOV. Pt was discharged with Oxygen but has not used it.   Dyspnea:  not pushing buggy, riding scooter instead / no longer doing PT Cough: minimal rattling but can be severe and worse at bedtime / nothing purulent now  Sleeping: 30 degrees bed  SABA use: twice daily neb  02: not using  Rec Plan A = Automatic = Always=    symbicort 160 2 first thing in am / chase with spriiva 2.5 x 2 puff and 12 hours later symbicort 160 x 2 puffs Work on inhaler technique Plan B = Backup (to supplement plan A, not to replace it) Only use your albuterol inhaler as a rescue medication Plan C = Crisis (instead of Plan B but only if Plan B stops working) - only use your albuterol nebulizer if you first try Plan B For cough > mucinex 1200 mg every 12 hours and take  oxycodone up to every 4 hours  or hydrocodone every 4 hours as needed   Please schedule a follow up office visit in 6 weeks, call sooner if needed with all medications /inhalers/ solutions in hand     03/01/2022  f/u ov/Betta Balla re: GOLD 2   maint on GOLD 2  spiriva/  symbicort 160 Chief Complaint  Patient presents with   Follow-up    Doing well.  Dyspnea:  limited by back not breathing  Cough: none  Sleeping: 30 degree bed SABA use: not all  02: none  Covid status:   never vax/ never infected  Lung cancer screening :  in program    No obvious day to day or daytime variability or assoc excess/ purulent sputum or mucus plugs or hemoptysis or cp or chest tightness, subjective wheeze or overt sinus or hb symptoms.   Sleeping  without nocturnal  or early am exacerbation  of respiratory  c/o's or need for noct saba. Also denies any obvious fluctuation of symptoms with weather or environmental changes or other aggravating or alleviating factors except as outlined above   No unusual exposure hx or h/o childhood pna/ asthma or knowledge of premature birth.  Current Allergies, Complete Past Medical History, Past Surgical History, Family History, and Social History were reviewed in [employer] record.  ROS  The following are not active complaints unless bolded Hoarseness, sore throat, dysphagia, dental problems, itching, sneezing,  nasal congestion or discharge of excess mucus or purulent secretions, ear ache,   fever, chills, sweats, unintended wt loss or wt gain, classically pleuritic or exertional cp,  orthopnea pnd or arm/hand swelling  or leg swelling, presyncope, palpitations, abdominal pain, anorexia, nausea, vomiting, diarrhea  or change in bowel habits or change in bladder habits, change in stools or change in urine, dysuria, hematuria,  rash, arthralgias, visual complaints, headache, numbness, weakness or ataxia or problems with walking or coordination,  change in mood or  memory.        Current Meds  Medication Sig   albuterol (PROAIR HFA) 108 (90 Base) MCG/ACT inhaler 2 puffs every 4 hours as needed only  if your can't catch your breath (Patient taking differently: Inhale 2 puffs into the lungs every 6 (six) hours as needed for  shortness of breath.)   albuterol (PROVENTIL) (2.5  MG/3ML) 0.083% nebulizer solution Take 3 mLs (2.5 mg total) by nebulization every 4 (four) hours as needed for wheezing or shortness of breath.   AMITIZA 24 MCG capsule Take 24 mcg by mouth daily as needed for constipation.   amLODipine (NORVASC) 10 MG tablet Take 1 tablet (10 mg total) by mouth daily.   Ascorbic Acid (VITAMIN C PO) Take 2 tablets by mouth daily.   atorvastatin (LIPITOR) 40 MG tablet Take 1 tablet (40 mg total) by mouth daily.   budesonide-formoterol (SYMBICORT) 160-4.5 MCG/ACT inhaler Inhale 2 puffs into the lungs 2 (two) times daily.   clopidogrel (PLAVIX) 75 MG tablet Take 75 mg by mouth daily.   Dextromethorphan HBr 15 MG TABS 30 mg orally every 6 to 8 hours; MAX, 120 mg/day   famotidine (PEPCID) 20 MG tablet One after supper (Patient taking differently: Take 20 mg by mouth daily.)   fluticasone (FLONASE) 50 MCG/ACT nasal spray Place 2 sprays into both nostrils daily.   gabapentin (NEURONTIN) 300 MG capsule Take 1 capsule (300 mg total) by mouth 3 (three) times daily. (Patient taking differently: Take 600 mg by mouth daily.)   loratadine (CLARITIN) 10 MG tablet Take 1 tablet (10 mg total) by mouth daily.   losartan-hydrochlorothiazide (HYZAAR) 100-12.5 MG tablet Take 1 tablet by mouth daily.   metFORMIN (GLUCOPHAGE) 500 MG tablet Take 1 tablet (500 mg total) by mouth 2 (two) times daily with a meal.   montelukast (SINGULAIR) 10 MG tablet TAKE 1 TABLET BY MOUTH EVERYDAY AT BEDTIME (Patient taking differently: Take 10 mg by mouth daily as needed (allergies).)   naloxone (NARCAN) nasal spray 4 mg/0.1 mL SMARTSIG:Both Nares   nitroGLYCERIN (NITROSTAT) 0.4 MG SL tablet Place 0.4 mg under the tongue every 5 (five) minutes as needed for chest pain.   Oxycodone HCl 20 MG TABS Take 20 mg by mouth 4 (four) times daily.   pantoprazole (PROTONIX) 40 MG tablet Take 1 tablet (40 mg total) by mouth daily.   promethazine-dextromethorphan  (PROMETHAZINE-DM) 6.25-15 MG/5ML syrup Take 15 mLs by mouth every 12 (twelve) hours.   sildenafil (VIAGRA) 100 MG tablet Take 1 tablet (100 mg total) by mouth daily as needed. (Patient taking differently: Take 100 mg by mouth daily as needed for erectile dysfunction.)   tamsulosin (FLOMAX) 0.4 MG CAPS capsule Take 0.4 mg by mouth daily.   Tiotropium Bromide Monohydrate 2.5 MCG/ACT AERS Inhale 2 each into the lungs daily.   Tiotropium Bromide-Olodaterol (STIOLTO RESPIMAT) 2.5-2.5 MCG/ACT AERS Inhale 2 puffs into the lungs daily.   Vitamin D, Ergocalciferol, (DRISDOL) 1.25 MG (50000 UNIT) CAPS capsule Take 50,000 Units by mouth once a week. Mondays   XTAMPZA ER 9 MG C12A Take 9 mg by mouth 2 (two) times daily.                     Objective:   Physical Exam   Wts  03/01/2022        151  01/18/2022      148 11/22/2021      146   08/16/2021        149  11/11/2020        147  08/11/2020          144 01/15/2020        140 03/26/2019        144  11/20/2018      144  08/14/2018          144  02/08/2018  143  11/22/17 142 lb (64.4 kg)  10/05/16 131 lb 12.8 oz (59.8 kg)     Vital signs reviewed  03/01/2022  - Note at rest 02 sats  96% on RA   General appearance:    amb bm nad  HEENT :  Oropharynx  clear       NECK :  without JVD/Nodes/TM/ nl carotid upstrokes bilaterally   LUNGS: no acc muscle use,  Mod barrel  contour chest wall with bilateral  Distant  ate exp wheeze and  without cough on insp or exp maneuvers and mod  Hyperresonant  to  percussion bilaterally     CV:  RRR  no s3 or murmur or increase in P2, and no edema   ABD:  soft and nontender with pos mid insp Hoover's  in the supine position. No bruits or organomegaly appreciated, bowel sounds nl  MS:   Ext warm without deformities or   obvious joint restrictions , calf tenderness, cyanosis or clubbing  SKIN: warm and dry without lesions    NEURO:  alert, approp, nl sensorium with  no motor or cerebellar deficits  apparent.                  I personally reviewed images and agree with radiology impression as follows:   Chest CT 10/16/21 LDSCT: Biapical pleuroparenchymal scarring. Centrilobular and paraseptal emphysema. Perihilar architectural distortion and bronchiectasis, as before. Bilateral postoperative scarring. Image quality in the lung bases is degraded by respiratory motion. Calcified granulomas.  Adherent debris in the airway.    .  Assessment:

## 2022-03-01 ENCOUNTER — Encounter: Payer: Self-pay | Admitting: Internal Medicine

## 2022-03-01 ENCOUNTER — Ambulatory Visit (INDEPENDENT_AMBULATORY_CARE_PROVIDER_SITE_OTHER): Payer: Medicaid Other | Admitting: Internal Medicine

## 2022-03-01 VITALS — BP 144/88 | HR 64 | Temp 98.1°F | Ht 65.0 in | Wt 151.4 lb

## 2022-03-01 DIAGNOSIS — J449 Chronic obstructive pulmonary disease, unspecified: Secondary | ICD-10-CM | POA: Diagnosis not present

## 2022-03-01 DIAGNOSIS — J9601 Acute respiratory failure with hypoxia: Secondary | ICD-10-CM | POA: Diagnosis not present

## 2022-03-01 NOTE — Assessment & Plan Note (Signed)
Quit smoking 05/2020    - Spirometry 10/05/2016  FEV1 1.36 (52%)  Ratio 65 p saba with classic curvature - 10/05/2016  > try dulera 200 2bid   - Allergy profile 11/22/2017 >  Eos 0.6 /  IgE  25 RAST neg x for roaches/ neg for mold  - 11/22/2017   continue dulera 200 2bid  - PFT's  02/08/2018  FEV1 0.90 (36 % ) ratio 59   p multiple saba prior to study with DLCO  52 % corrects to 106  % for alv volume   02/08/2018  After extensive coaching inhaler device,  effectiveness =    90% from a baseline of 75% with smi so try spiriva respimat - 08/14/2018    changed to trelegy but preferred symb/spriva - 11/20/2018     Breztri > better but not covered by medicaid - 03/26/2019  After extensive coaching inhaler device,  effectiveness =    75% (short Ti) > rechallenge with Breztri > not covered, resumed symb 160 but only took prn - 01/15/2020 re-started on symb 160/spiriva 2.5 daily  - 08/11/2020  After extensive coaching inhaler device,  effectiveness =    75% > continue symbicort/ spiriva - PFT's  08/11/2020  FEV1 1.34 (54 % ) ratio 0.61  p 10 % improvement from saba p spiriva prior to study with DLCO  11.94 (52%) corrects to 3.41 (77%)  for alv volume and FV curve classic concavity   - 11/22/2021   continue symb/spiriva add max gerd rx and f/u in 6 weeks - 03/01/2022  After extensive coaching inhaler device,  effectiveness =    90% > continue symb 160/spiriva 2.5    Group D (now reclassified as E) in terms of symptom/risk and laba/lama/ICS  therefore appropriate rx at this point >>>  spiriva/symbicort as above and approp saba.  Would likely be more sob if more active but limited by back so no additional recs at this point  F/u q 6 m          Each maintenance medication was reviewed in detail including emphasizing most importantly the difference between maintenance and prns and under what circumstances the prns are to be triggered using an action plan format where appropriate.  Total time for H and P, chart review,  counseling, reviewing hfa/smi device(s) and generating customized AVS unique to this office visit / same day charting = 25 min

## 2022-03-01 NOTE — Patient Instructions (Addendum)
No change in medications  Work on inhaler technique:  relax and gently blow all the way out then take a nice smooth full deep breath back in, triggering the inhaler at same time you start breathing in.  Hold breath in for at least  5 seconds if you can. Blow out symbicort 160  thru nose. Rinse and gargle with water when done.  If mouth or throat bother you at all,  try brushing teeth/gums/tongue with arm and hammer toothpaste/ make a slurry and gargle and spit out.   Will stop your oxygen  Please schedule a follow up visit in  6  months but call sooner if needed

## 2022-03-01 NOTE — Assessment & Plan Note (Signed)
D/c'd on 02 01/13/22 > d/c'd 03/01/2022 at pt's request

## 2022-03-02 ENCOUNTER — Other Ambulatory Visit (HOSPITAL_COMMUNITY): Payer: Self-pay

## 2022-03-02 MED ORDER — SILDENAFIL CITRATE 100 MG PO TABS
100.0000 mg | ORAL_TABLET | Freq: Every day | ORAL | 5 refills | Status: DC | PRN
  Filled 2022-03-02 – 2022-05-11 (×3): qty 30, 30d supply, fill #0
  Filled 2022-07-06: qty 30, 30d supply, fill #1
  Filled 2022-08-30: qty 30, 30d supply, fill #2
  Filled 2022-10-04: qty 30, 30d supply, fill #3
  Filled 2022-10-24: qty 30, 30d supply, fill #4
  Filled 2022-12-08: qty 30, 30d supply, fill #5

## 2022-03-03 IMAGING — DX DG CHEST 1V PORT
1 series · 1 of 1 positions shown · non-contrast
Comparison: 05/06/2020

CLINICAL DATA: Shortness of breath

EXAM:
PORTABLE CHEST 1 VIEW

[chest ap]
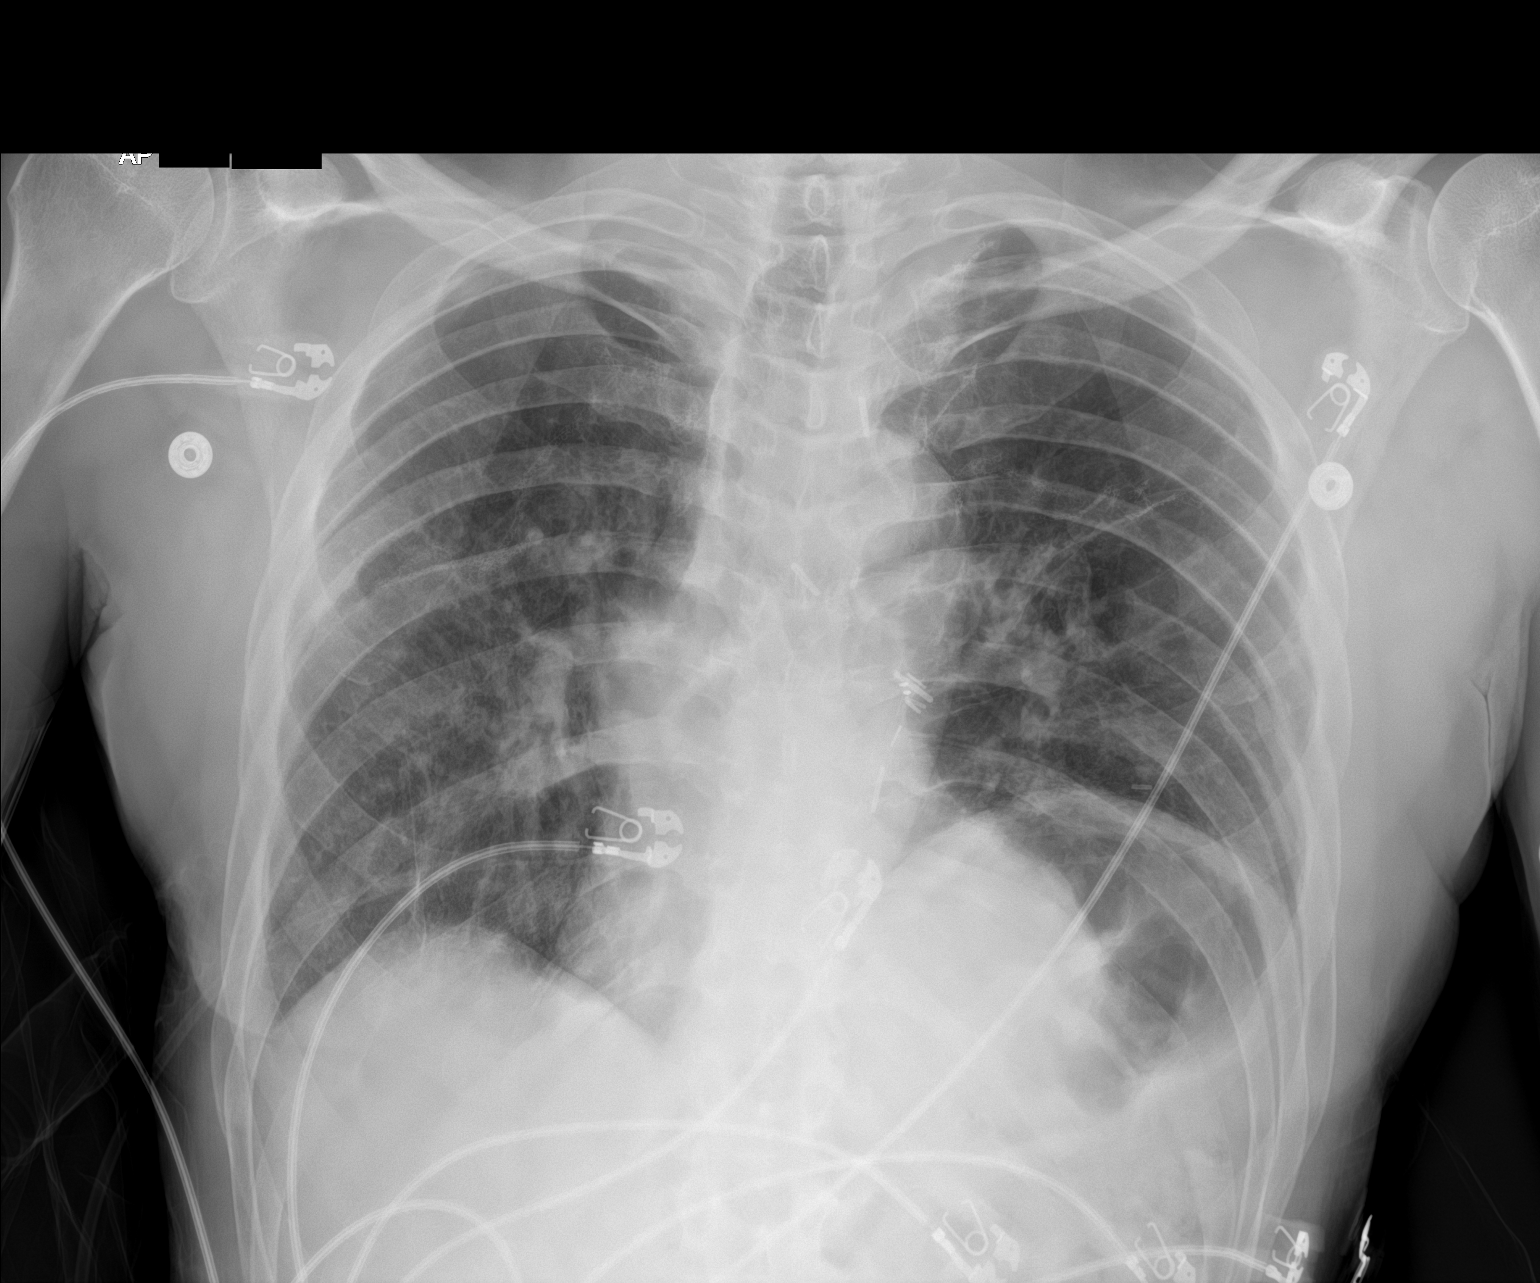

[1 of 1 positions shown; findings below may reference images not displayed]

FINDINGS: Mild elevation of the left hemidiaphragm. Scarring in the lungs
bilaterally. No definite acute confluent opacity or effusion. Heart
is normal size.
IMPRESSION: Stable chronic changes.  No active disease.

## 2022-03-10 ENCOUNTER — Other Ambulatory Visit (HOSPITAL_COMMUNITY): Payer: Self-pay

## 2022-04-14 ENCOUNTER — Other Ambulatory Visit (HOSPITAL_COMMUNITY): Payer: Self-pay

## 2022-04-14 ENCOUNTER — Ambulatory Visit (INDEPENDENT_AMBULATORY_CARE_PROVIDER_SITE_OTHER): Payer: Medicaid Other | Admitting: Podiatry

## 2022-04-14 DIAGNOSIS — B351 Tinea unguium: Secondary | ICD-10-CM

## 2022-04-14 DIAGNOSIS — E119 Type 2 diabetes mellitus without complications: Secondary | ICD-10-CM | POA: Diagnosis not present

## 2022-04-14 DIAGNOSIS — M79675 Pain in left toe(s): Secondary | ICD-10-CM | POA: Diagnosis not present

## 2022-04-14 DIAGNOSIS — M79674 Pain in right toe(s): Secondary | ICD-10-CM

## 2022-04-14 NOTE — Progress Notes (Signed)
  Subjective:  Patient ID: DODD SCIANNA, male    DOB: 1960/03/17,  MRN: [id number]  Chief Complaint  Patient presents with   Nail Problem    Diabetic Foot Care- Blood Sugar 150 (couple of days ago)    62 y.o. male presents with the above complaint. History confirmed with patient. Patient presenting with pain related to dystrophic thickened elongated nails. Patient is unable to trim own nails related to nail dystrophy and/or mobility issues. Patient does have a history of T2DM. He does take metformin but no insulin. Denies numbness in foot.  Objective:  Physical Exam: warm, good capillary refill nail exam onychomycosis of the toenails, onycholysis, and dystrophic nails DP pulses palpable, PT pulses palpable, and protective sensation intact Left Foot:  Pain with palpation of nails due to elongation and dystrophic growth.  Right Foot: Pain with palpation of nails due to elongation and dystrophic growth.   Assessment:   1. Pain due to onychomycosis of toenails of both feet   2. Type 2 diabetes mellitus without complication, without [place]-term current use of insulin ([place])      Plan:  Patient was evaluated and treated and all questions answered.  #Dm2 without complication Patient educated on diabetes. Discussed proper diabetic foot care and discussed risks and complications of disease. Educated patient in depth on reasons to return to the office immediately should he/she discover anything concerning or new on the feet. All questions answered. Discussed proper shoes as well.   #Onychomycosis with pain  -Nails palliatively debrided as below. -Educated on self-care  Procedure: Nail Debridement Rationale: Pain Type of Debridement: manual, sharp debridement. Instrumentation: Nail nipper, rotary burr. Number of Nails: 10  Return in about 3 months (around 07/15/2022) for [facility].         Everitt Amber, DPM [facility]

## 2022-04-29 ENCOUNTER — Other Ambulatory Visit (HOSPITAL_COMMUNITY): Payer: Self-pay

## 2022-05-11 ENCOUNTER — Other Ambulatory Visit (HOSPITAL_COMMUNITY): Payer: Self-pay

## 2022-07-06 ENCOUNTER — Other Ambulatory Visit (HOSPITAL_COMMUNITY): Payer: Self-pay

## 2022-07-23 IMAGING — DX DG CHEST 2V
2 series · 2 of 2 positions shown · non-contrast
Comparison: Radiographs 05/23/2020.  CT 04/13/2020.

CLINICAL DATA: Progressive cough over the last month. History of
asthma/COPD.

EXAM:
CHEST - 2 VIEW

[chest pa]
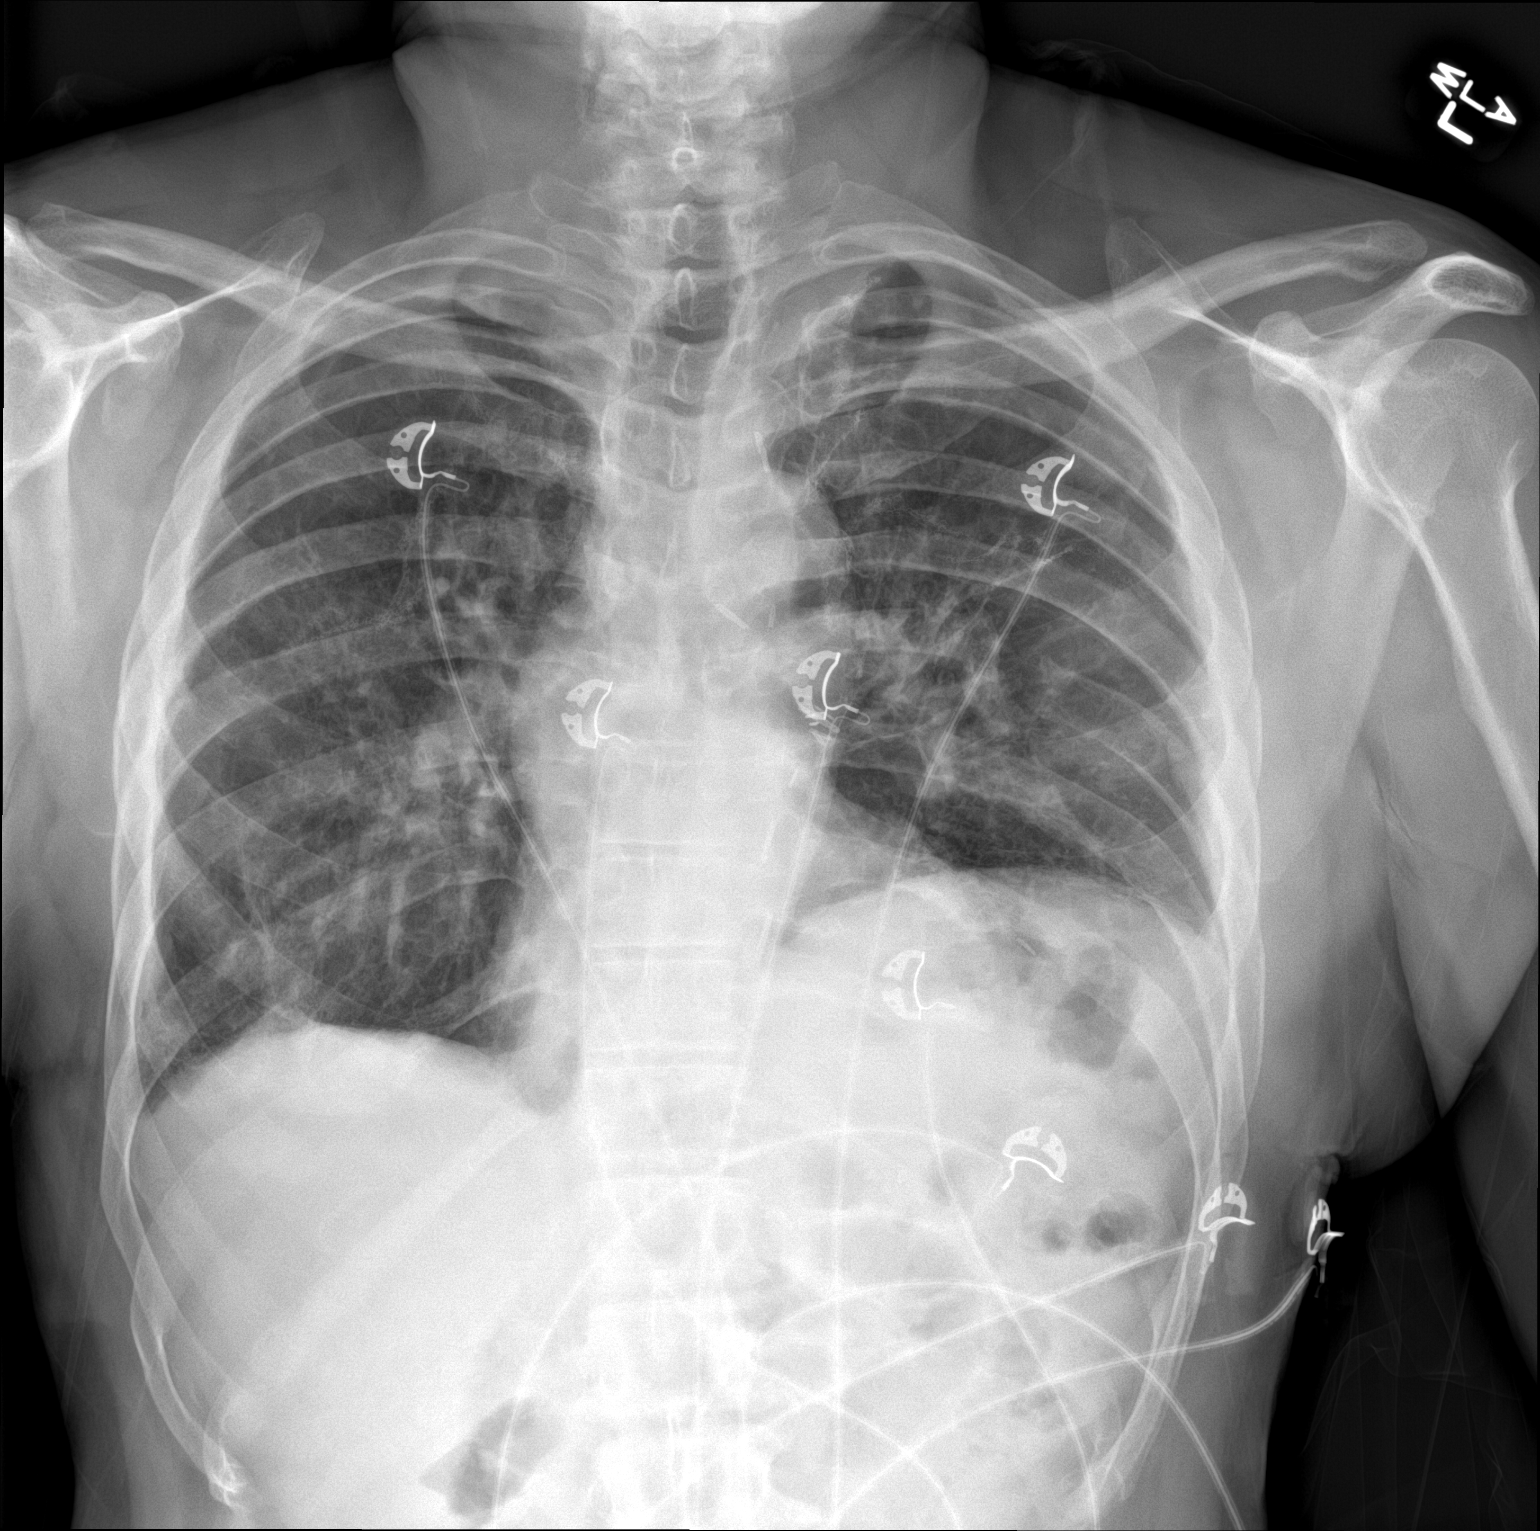

[chest lat]
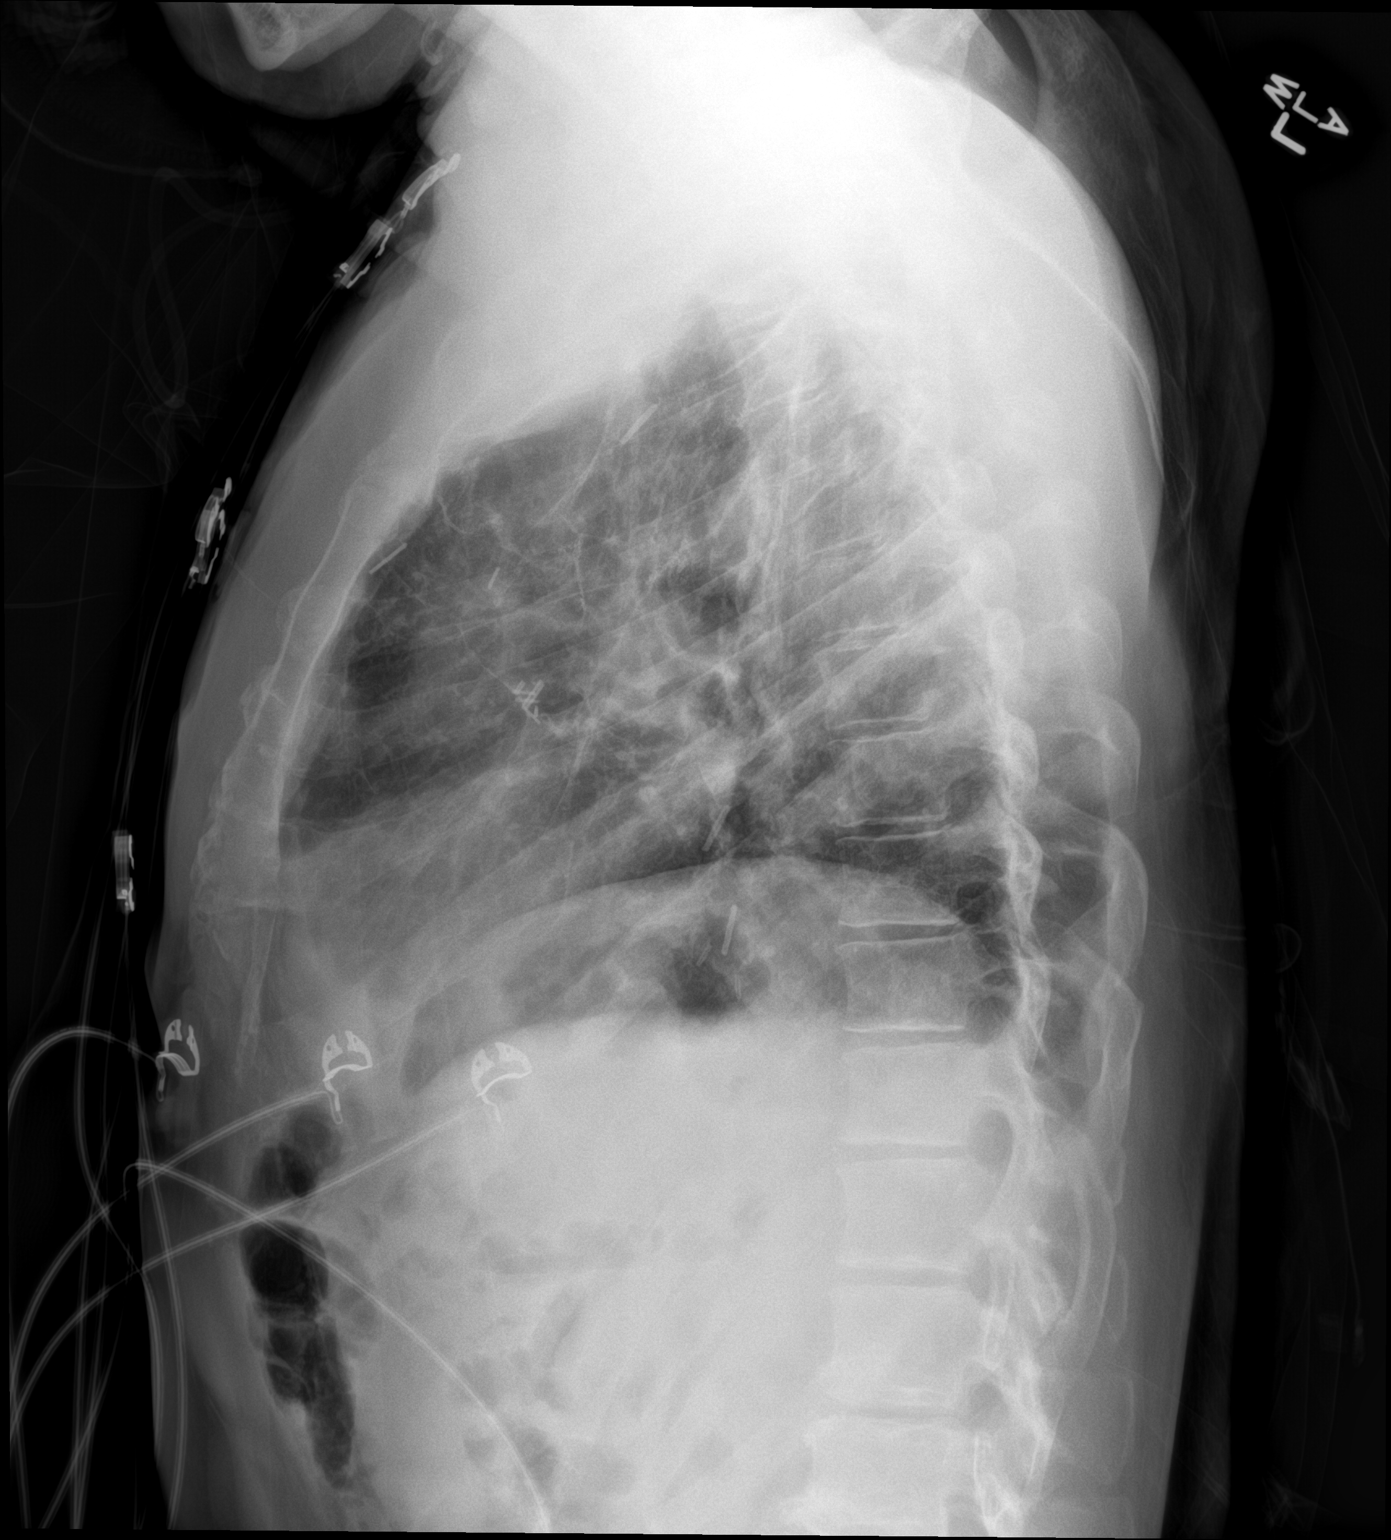

[2 of 2 positions shown; findings below may reference images not displayed]

FINDINGS: The heart size and mediastinal contours are stable. There is stable
volume loss in the left hemithorax with postsurgical changes related
to previous partial lung resection bilaterally. Bilateral pulmonary
scarring appears unchanged. No superimposed airspace disease,
pleural effusion or pneumothorax identified. The bones appear
unremarkable. Telemetry leads overlie the chest.
IMPRESSION: Stable chronic lung disease and postsurgical changes. No acute
cardiopulmonary process identified.

## 2022-07-26 ENCOUNTER — Emergency Department (HOSPITAL_COMMUNITY)
Admission: EM | Admit: 2022-07-26 | Discharge: 2022-07-26 | Disposition: A | Discharge status: Home / Self Care | Payer: Medicaid Other | Attending: Emergency Medicine | Admitting: Emergency Medicine

## 2022-07-26 ENCOUNTER — Emergency Department (HOSPITAL_COMMUNITY): Payer: Medicaid Other

## 2022-07-26 ENCOUNTER — Encounter (HOSPITAL_COMMUNITY): Payer: Self-pay

## 2022-07-26 DIAGNOSIS — J45909 Unspecified asthma, uncomplicated: Secondary | ICD-10-CM | POA: Insufficient documentation

## 2022-07-26 DIAGNOSIS — I10 Essential (primary) hypertension: Secondary | ICD-10-CM | POA: Insufficient documentation

## 2022-07-26 DIAGNOSIS — Z79899 Other long term (current) drug therapy: Secondary | ICD-10-CM | POA: Diagnosis not present

## 2022-07-26 DIAGNOSIS — Z7901 Long term (current) use of anticoagulants: Secondary | ICD-10-CM | POA: Diagnosis not present

## 2022-07-26 DIAGNOSIS — R059 Cough, unspecified: Secondary | ICD-10-CM | POA: Diagnosis present

## 2022-07-26 DIAGNOSIS — J449 Chronic obstructive pulmonary disease, unspecified: Secondary | ICD-10-CM | POA: Diagnosis not present

## 2022-07-26 DIAGNOSIS — Z1152 Encounter for screening for COVID-19: Secondary | ICD-10-CM | POA: Insufficient documentation

## 2022-07-26 DIAGNOSIS — E119 Type 2 diabetes mellitus without complications: Secondary | ICD-10-CM | POA: Insufficient documentation

## 2022-07-26 DIAGNOSIS — J189 Pneumonia, unspecified organism: Secondary | ICD-10-CM | POA: Insufficient documentation

## 2022-07-26 LAB — CBC WITH DIFFERENTIAL/PLATELET
Abs Immature Granulocytes: 0.02 10*3/uL (ref 0.00–0.07)
Basophils Absolute: 0 10*3/uL (ref 0.0–0.1)
Basophils Relative: 1 %
Eosinophils Absolute: 0.4 10*3/uL (ref 0.0–0.5)
Eosinophils Relative: 5 %
HCT: 44.3 % (ref 39.0–52.0)
Hemoglobin: 14.3 g/dL (ref 13.0–17.0)
Immature Granulocytes: 0 %
Lymphocytes Relative: 35 %
Lymphs Abs: 2.5 10*3/uL (ref 0.7–4.0)
MCH: 28.1 pg (ref 26.0–34.0)
MCHC: 32.3 g/dL (ref 30.0–36.0)
MCV: 87.2 fL (ref 80.0–100.0)
Monocytes Absolute: 1.1 10*3/uL — ABNORMAL HIGH (ref 0.1–1.0)
Monocytes Relative: 15 %
Neutro Abs: 3.2 10*3/uL (ref 1.7–7.7)
Neutrophils Relative %: 44 %
Platelets: 374 10*3/uL (ref 150–400)
RBC: 5.08 MIL/uL (ref 4.22–5.81)
RDW: 13.5 % (ref 11.5–15.5)
WBC: 7.2 10*3/uL (ref 4.0–10.5)
nRBC: 0 % (ref 0.0–0.2)

## 2022-07-26 LAB — RESP PANEL BY RT-PCR (RSV, FLU A&B, COVID)  RVPGX2
Influenza A by PCR: NEGATIVE
Influenza B by PCR: NEGATIVE
Resp Syncytial Virus by PCR: NEGATIVE
SARS Coronavirus 2 by RT PCR: NEGATIVE

## 2022-07-26 LAB — COMPREHENSIVE METABOLIC PANEL
ALT: 16 U/L (ref 0–44)
AST: 21 U/L (ref 15–41)
Albumin: 3 g/dL — ABNORMAL LOW (ref 3.5–5.0)
Alkaline Phosphatase: 69 U/L (ref 38–126)
Anion gap: 11 (ref 5–15)
BUN: 9 mg/dL (ref 8–23)
CO2: 27 mmol/L (ref 22–32)
Calcium: 8.6 mg/dL — ABNORMAL LOW (ref 8.9–10.3)
Chloride: 98 mmol/L (ref 98–111)
Creatinine, Ser: 1.06 mg/dL (ref 0.61–1.24)
GFR, Estimated: >60 mL/min (ref >60)
Glucose, Bld: 83 mg/dL (ref 70–99)
Potassium: 4.3 mmol/L (ref 3.5–5.1)
Sodium: 136 mmol/L (ref 135–145)
Total Bilirubin: 0.6 mg/dL (ref 0.3–1.2)
Total Protein: 6.4 g/dL — ABNORMAL LOW (ref 6.5–8.1)

## 2022-07-26 LAB — TROPONIN I (HIGH SENSITIVITY)
Troponin I (High Sensitivity): 8 ng/L (ref <18)
Troponin I (High Sensitivity): 7 ng/L (ref <18)

## 2022-07-26 MED ORDER — DOXYCYCLINE HYCLATE 100 MG PO TABS
100.0000 mg | ORAL_TABLET | Freq: Once | ORAL | Status: AC
  Administered 2022-07-26: 100 mg via ORAL
  Filled 2022-07-26: qty 1

## 2022-07-26 MED ORDER — PREDNISONE 20 MG PO TABS
40.0000 mg | ORAL_TABLET | Freq: Once | ORAL | Status: AC
  Administered 2022-07-26: 40 mg via ORAL
  Filled 2022-07-26: qty 2

## 2022-07-26 MED ORDER — IPRATROPIUM-ALBUTEROL 0.5-2.5 (3) MG/3ML IN SOLN
3.0000 mL | Freq: Once | RESPIRATORY_TRACT | Status: AC
  Administered 2022-07-26: 3 mL via RESPIRATORY_TRACT
  Filled 2022-07-26: qty 3

## 2022-07-26 MED ORDER — PREDNISONE 20 MG PO TABS: 40.0000 mg | ORAL_TABLET | Freq: Every day | ORAL | 0 refills | Status: DC

## 2022-07-26 MED ORDER — DOXYCYCLINE HYCLATE 100 MG PO CAPS: 100.0000 mg | ORAL_CAPSULE | Freq: Two times a day (BID) | ORAL | 0 refills | Status: AC

## 2022-07-26 NOTE — ED Provider Triage Note (Signed)
Emergency Medicine Provider Triage Evaluation Note  AYOTUNDE KASPAREK , a 62 y.o. male  was evaluated in triage.  Pt complains of cough and cold symptoms the past month.  He reports has been intermittent but still cannot get her this cough.  Occasionally productive.  No hemoptysis.  He does report some shortness of breath but has history of bronchitis, sarcoidosis, and asthma per patient.  Reports it has been worsening.  He has been trying his albuterol inhaler and nebulizer more often without much relief of his symptoms.  Reports some chest pain and decrease in appetite.  Reports nasal congestion as well.  Chills but no recorded fevers.  Review of Systems  Positive:  Negative:   Physical Exam  BP (!) 134/92   Pulse (!) 107   Temp 99.3 F (37.4 C)   Resp 18   Ht 5\' 5"  (1.651 m)   Wt 68.5 kg   SpO2 90%   BMI 25.13 kg/m  Gen:   Awake, no distress   Resp:  Normal effort, expiratory wheezing heard more on the left lower lung. MSK:   Moves extremities without difficulty  Other:    Medical Decision Making  Medically screening exam initiated at 2:34 PM.  Appropriate orders placed.  [place] [facility]esc[facility] Homesley was informed that the remainder of the evaluation will be completed by another provider, this initial triage assessment does not replace that evaluation, and the importance of remaining in the ED until their evaluation is complete.  O2 at 90%.  No significant tachypnea however patient does have some wheezing to the left lower lung fields.  Ordered DuoNeb.  Labs ordered as well.   Achille Rich, PA-C 07/26/22 1436

## 2022-07-26 NOTE — Discharge Instructions (Addendum)
It was a pleasure caring for you today.  Chest x-ray was concerning for atypical pneumonia.  I am prescribing you a course of antibiotics and steroids.  Rest of your blood workup today was reassuring.  Seek emergency care if experiencing any new or worsening symptoms.

## 2022-07-26 NOTE — ED Provider Notes (Signed)
Glenn [name] Provider Note   CSN: [id number] Arrival date & time: 07/26/22  1410     History  Chief Complaint  Patient presents with   Cough   Nasal Congestion    Glenn [name] is a 62 y.o. male with PMHx COPD, Asthma, DM, HLD, HTN who presents to the ED complaining of coughing x1 month.  Patient also complaining of chest pain/ribcage pain due to coughing.  Patient states that he had a cardiology workup earlier this year that was not concerning for any cardiac abnormalities.  Patient states that he has troubles breathing when he is having a coughing fit that is relieved with his nebulizing treatment.  Denies fever, facial pain, nausea, vomiting, syncope.  Denies history of DVT/PE, recent surgery/trauma, recent immobilization.   Cough      Home Medications Prior to Admission medications   Medication Sig Start Date End Date Taking? Authorizing Provider  doxycycline (VIBRAMYCIN) 100 MG capsule Take 1 capsule (100 mg total) by mouth 2 (two) times daily for 7 days. Oral: 100 mg twice daily for 5 days but may extend up to 14 days depending on severity and clinical response 07/26/22 08/02/22 Yes Sharyl Nimrod, Charlotte Sanes F, PA-C  predniSONE (DELTASONE) 20 MG tablet Take 2 tablets (40 mg total) by mouth daily. 07/26/22  Yes Valrie Hart F, PA-C  albuterol (PROAIR HFA) 108 (90 Base) MCG/ACT inhaler 2 puffs every 4 hours as needed only  if your can't catch your breath Patient taking differently: Inhale 2 puffs into the lungs every 6 (six) hours as needed for shortness of breath. 11/11/20   [name], MD  albuterol (PROVENTIL) (2.5 MG/3ML) 0.083% nebulizer solution Take 3 mLs (2.5 mg total) by nebulization every 4 (four) hours as needed for wheezing or shortness of breath. 11/19/21   Ghimire, Werner Lean, MD  AMITIZA 24 MCG capsule Take 24 mcg by mouth daily as needed for constipation. 03/31/20   [provider]  amLODipine (NORVASC) 10 MG  tablet Take 1 tablet (10 mg total) by mouth daily. 10/03/21   Rai, Delene Ruffini, MD  Ascorbic Acid (VITAMIN C PO) Take 2 tablets by mouth daily.    [provider]  atorvastatin (LIPITOR) 40 MG tablet Take 1 tablet (40 mg total) by mouth daily. 06/26/20   Pokhrel, Rebekah Chesterfield, MD  budesonide-formoterol (SYMBICORT) 160-4.5 MCG/ACT inhaler Inhale 2 puffs into the lungs 2 (two) times daily. 07/23/21   [name], MD  clopidogrel (PLAVIX) 75 MG tablet Take 75 mg by mouth daily. 07/06/21   [provider]  Dextromethorphan HBr 15 MG TABS 30 mg orally every 6 to 8 hours; MAX, 120 mg/day 01/15/22   Arthor Captain, PA-C  famotidine (PEPCID) 20 MG tablet One after supper Patient taking differently: Take 20 mg by mouth daily. 11/22/21   [name], MD  fluticasone (FLONASE) 50 MCG/ACT nasal spray Place 2 sprays into both nostrils daily. 11/19/21   Ghimire, Werner Lean, MD  gabapentin (NEURONTIN) 300 MG capsule Take 1 capsule (300 mg total) by mouth 3 (three) times daily. Patient taking differently: Take 600 mg by mouth daily. 02/09/18   Eustace Moore, MD  loratadine (CLARITIN) 10 MG tablet Take 1 tablet (10 mg total) by mouth daily. 01/03/22     losartan-hydrochlorothiazide (HYZAAR) 100-12.5 MG tablet Take 1 tablet by mouth daily. 09/03/21   [provider]  metFORMIN (GLUCOPHAGE) 500 MG tablet Take 1 tablet (500 mg total) by mouth 2 (two) times  daily with a meal. 10/03/21   Rai, Delene Ruffini, MD  naloxone [facility]) nasal spray 4 mg/0.1 mL SMARTSIG:Both Nares 08/20/21   [provider]  nitroGLYCERIN (NITROSTAT) 0.4 MG SL tablet Place 0.4 mg under the tongue every 5 (five) minutes as needed for chest pain.    [provider]  Oxycodone HCl 20 MG TABS Take 20 mg by mouth 4 (four) times daily. 03/31/20   [provider]  pantoprazole (PROTONIX) 40 MG tablet Take 1 tablet (40 mg total) by mouth daily. 07/02/19   Elgergawy, Leana Roe, MD  promethazine-dextromethorphan  (PROMETHAZINE-DM) 6.25-15 MG/5ML syrup Take 15 mLs by mouth every 12 (twelve) hours.    [provider]  sildenafil (VIAGRA) 100 MG tablet Take 1 tablet (100 mg total) by mouth daily as needed. 03/02/22     tamsulosin (FLOMAX) 0.4 MG CAPS capsule Take 0.4 mg by mouth daily. 04/26/21   [provider]  Tiotropium Bromide Monohydrate 2.5 MCG/ACT AERS Inhale 2 each into the lungs daily.    [provider]  Tiotropium Bromide-Olodaterol (STIOLTO RESPIMAT) 2.5-2.5 MCG/ACT AERS Inhale 2 puffs into the lungs daily. 01/18/22   [name], MD  Vitamin D, Ergocalciferol, (DRISDOL) 1.25 MG (50000 UNIT) CAPS capsule Take 50,000 Units by mouth once a week. Mondays 05/02/19   [provider]  XTAMPZA ER 9 MG C12A Take 9 mg by mouth 2 (two) times daily. 04/09/21   [provider]      Allergies    Insulins, Ibuprofen, Pork-derived products, Shrimp [shellfish allergy], Tylenol [acetaminophen], and Lisinopril    Review of Systems   Review of Systems  Respiratory:  Positive for cough.     Physical Exam Updated Vital Signs BP (!) 129/92 (BP Location: Right Arm)   Pulse 99   Temp 99.3 F (37.4 C)   Resp 14   Ht 5\' 5"  (1.651 m)   Wt 68.5 kg   SpO2 93%   BMI 25.13 kg/m  Physical Exam Vitals and nursing note reviewed.  Constitutional:      General: He is not in acute distress.    Appearance: He is not ill-appearing or toxic-appearing.  HENT:     Head: Normocephalic and atraumatic.     Mouth/Throat:     Mouth: Mucous membranes are moist.     Pharynx: No oropharyngeal exudate or posterior oropharyngeal erythema.  Eyes:     General: No scleral icterus.       Right eye: No discharge.        Left eye: No discharge.     Conjunctiva/sclera: Conjunctivae normal.  Cardiovascular:     Rate and Rhythm: Normal rate.     Pulses: Normal pulses.     Heart sounds: Normal heart sounds. No murmur heard.    Comments: .+2 radial pulses Pulmonary:     Effort:  Pulmonary effort is normal. No respiratory distress.     Breath sounds: Normal breath sounds. No wheezing, rhonchi or rales.     Comments: Lungs CTABL. Oxygen 93% on RA. Patient without tachypnea or increased work of breathing. Abdominal:     Tenderness: There is no abdominal tenderness.  Musculoskeletal:     Right lower leg: No edema.     Left lower leg: No edema.     Comments: No calf tenderness or LE swelling.  Skin:    General: Skin is warm and dry.     Findings: No rash.  Neurological:     General: No focal deficit present.  Mental Status: He is alert. Mental status is at baseline.  Psychiatric:        Mood and Affect: Mood normal.        Behavior: Behavior normal.     ED Results / Procedures / Treatments   Labs (all labs ordered are listed, but only abnormal results are displayed) Labs Reviewed  CBC WITH DIFFERENTIAL/PLATELET - Abnormal; Notable for the following components:      Result Value   Monocytes Absolute 1.1 (*)    All other components within normal limits  COMPREHENSIVE METABOLIC PANEL - Abnormal; Notable for the following components:   Calcium 8.6 (*)    Total Protein 6.4 (*)    Albumin 3.0 (*)    All other components within normal limits  RESP PANEL BY RT-PCR (RSV, FLU A&B, COVID)  [id number]  TROPONIN I (HIGH SENSITIVITY)  TROPONIN I (HIGH SENSITIVITY)    EKG None  Radiology DG Chest 2 View  Result Date: 07/26/2022 CLINICAL DATA:  Cough, congestion, shortness of breath, chest pain. EXAM: CHEST - 2 VIEW COMPARISON:  Chest radiograph 01/15/2022. FINDINGS: The cardiomediastinal silhouette is stable. Postsurgical changes are again noted in the left hemithorax with associated volume loss and asymmetric elevation of the left hemidiaphragm. There are increased interstitial markings in the right lung which appear worsened compared to the chest radiograph from 01/15/2022. There is linear scarring in the right lower lobe. There is no focal consolidation. There is  no pleural effusion or pneumothorax There is no acute osseous abnormality. IMPRESSION: 1. Increased interstitial markings in the right lung may reflect viral/atypical infection or less likely asymmetric pulmonary edema. Consider follow-up radiographs in 6-8 weeks to assess for resolution. 2. Stable postsurgical changes with associated volume loss in the left hemithorax. Electronically Signed   By: Lesia Hausen M.D.   On: 07/26/2022 15:41    Procedures Procedures    Medications Ordered in ED Medications  ipratropium-albuterol (DUONEB) 0.5-2.5 (3) MG/3ML nebulizer solution 3 mL (3 mLs Nebulization Given 07/26/22 1655)  doxycycline (VIBRA-TABS) tablet 100 mg (100 mg Oral Given 07/26/22 1756)  predniSONE (DELTASONE) tablet 40 mg (40 mg Oral Given 07/26/22 1756)    ED Course/ Medical Decision Making/ A&P                             Medical Decision Making Amount and/or Complexity of Data Reviewed Radiology: ordered.  Risk Prescription drug management.   This patient presents to the ED for concern of shortness of breath, this involves an extensive number of treatment options, and is a complaint that carries with it a high risk of complications and morbidity.  The differential diagnosis includes Anxiety, Anaphylaxis/Angioedema, Aspirated FB, Arrhythmia, CHF, Asthma, COPD, PNA, COVID/Flu/RSV, STEMI, Tamponade, TPNX, Sepsis   Co morbidities that complicate the patient evaluation  COPD, asthma    Lab Tests:  I Ordered, and personally interpreted labs.  The pertinent results include:   - CMP: no concern for electrolyte abnormality; no concern for kidney/liver damage - Trop: initial and repeat within normal limits - CBC: No concern for anemia or leukocytosis - Respiratory Panel: negative   Imaging Studies ordered:  I ordered imaging studies including  -chest xray: to assess for process that is contributing to patient's symptoms I independently visualized and interpreted imaging I  agree with the radiologist interpretation   Cardiac Monitoring: / EKG:  The patient was maintained on a cardiac monitor.  I personally viewed and interpreted the cardiac  monitored which showed an underlying rhythm of: sinus tachycardia without acute ST changes or arrhythmias     Problem List / ED Course / Critical interventions / Medication management  Patient presents to ED complaining of cough x1 month. Patient with dyspnea during coughing fits that is relieved with nebulizing treatments. Physical exam unremarkable. Lungs CTABL. Patient complaining of chest pain that is described as rib pain with coughing - troponins and EKG without concern for ACS/STEMI. Chest xray concerning for atypical PNA. Patient with low grade fever and mild tachycardia (99BPM) but overall looks well and not in respiratory distress. O2 93%-100% on RA. Discharging patient with doxy and prednisone. Also provided patient with dose of ABX and steroids in ED. Patient verbalized understanding of plan. Recommend follow up with PCP. I have reviewed the patients home medicines and have made adjustments as needed Patient was given return precautions. Patient afebrile with stable vitals and is ready for discharge.  DDx: These are considered less likely due to history of present illness and physical exam findings Arrhythmia: EKG with NSR STEMI: EKG and troponins reassuring Tamponade: chest xray without concern CHF: no physical exam findings TPNX: Lungs clear to auscultation bilaterally Sepsis: afebrile and other vital signs stable     Social Determinants of Health:  none          Final Clinical Impression(s) / ED Diagnoses Final diagnoses:  Atypical pneumonia    Rx / DC Orders ED Discharge Orders          Ordered    doxycycline (VIBRAMYCIN) 100 MG capsule  2 times daily        07/26/22 1753    predniSONE (DELTASONE) 20 MG tablet  Daily        07/26/22 1753              Dorthy Cooler,  PA-C 07/27/22 0152    Virgina Norfolk, DO 07/28/22 [phone]

## 2022-07-26 NOTE — ED Triage Notes (Addendum)
Pt c/o cough and nasal congestion x"over a month."  Pt reports ribcage pain d/t coughing.  Pt reports taking OTC medications w/o relief.  Hx of asthma and COPD.

## 2022-08-12 ENCOUNTER — Emergency Department (HOSPITAL_COMMUNITY): Payer: No Typology Code available for payment source

## 2022-08-12 ENCOUNTER — Emergency Department (HOSPITAL_COMMUNITY)
Admission: EM | Admit: 2022-08-12 | Discharge: 2022-08-12 | Disposition: A | Discharge status: Home / Self Care | Payer: No Typology Code available for payment source | Attending: Emergency Medicine | Admitting: Emergency Medicine

## 2022-08-12 ENCOUNTER — Other Ambulatory Visit: Payer: Self-pay

## 2022-08-12 ENCOUNTER — Encounter (HOSPITAL_COMMUNITY): Payer: Self-pay

## 2022-08-12 DIAGNOSIS — J189 Pneumonia, unspecified organism: Secondary | ICD-10-CM | POA: Diagnosis not present

## 2022-08-12 DIAGNOSIS — Z87891 Personal history of nicotine dependence: Secondary | ICD-10-CM | POA: Insufficient documentation

## 2022-08-12 DIAGNOSIS — I10 Essential (primary) hypertension: Secondary | ICD-10-CM | POA: Diagnosis not present

## 2022-08-12 DIAGNOSIS — J45909 Unspecified asthma, uncomplicated: Secondary | ICD-10-CM | POA: Diagnosis not present

## 2022-08-12 DIAGNOSIS — E119 Type 2 diabetes mellitus without complications: Secondary | ICD-10-CM | POA: Diagnosis not present

## 2022-08-12 DIAGNOSIS — J449 Chronic obstructive pulmonary disease, unspecified: Secondary | ICD-10-CM | POA: Insufficient documentation

## 2022-08-12 DIAGNOSIS — R079 Chest pain, unspecified: Secondary | ICD-10-CM | POA: Diagnosis present

## 2022-08-12 LAB — CBC WITH DIFFERENTIAL/PLATELET
Abs Immature Granulocytes: 0.02 10*3/uL (ref 0.00–0.07)
Basophils Absolute: 0 10*3/uL (ref 0.0–0.1)
Basophils Relative: 1 %
Eosinophils Absolute: 0.4 10*3/uL (ref 0.0–0.5)
Eosinophils Relative: 5 %
HCT: 40.4 % (ref 39.0–52.0)
Hemoglobin: 12.7 g/dL — ABNORMAL LOW (ref 13.0–17.0)
Immature Granulocytes: 0 %
Lymphocytes Relative: 34 %
Lymphs Abs: 2.8 10*3/uL (ref 0.7–4.0)
MCH: 27.3 pg (ref 26.0–34.0)
MCHC: 31.4 g/dL (ref 30.0–36.0)
MCV: 86.7 fL (ref 80.0–100.0)
Monocytes Absolute: 0.9 10*3/uL (ref 0.1–1.0)
Monocytes Relative: 12 %
Neutro Abs: 4 10*3/uL (ref 1.7–7.7)
Neutrophils Relative %: 48 %
Platelets: 324 10*3/uL (ref 150–400)
RBC: 4.66 MIL/uL (ref 4.22–5.81)
RDW: 13.7 % (ref 11.5–15.5)
WBC: 8.2 10*3/uL (ref 4.0–10.5)
nRBC: 0 % (ref 0.0–0.2)

## 2022-08-12 LAB — COMPREHENSIVE METABOLIC PANEL WITH GFR
ALT: 14 U/L (ref 0–44)
AST: 16 U/L (ref 15–41)
Albumin: 2.8 g/dL — ABNORMAL LOW (ref 3.5–5.0)
Alkaline Phosphatase: 66 U/L (ref 38–126)
Anion gap: 9 (ref 5–15)
BUN: 10 mg/dL (ref 8–23)
CO2: 28 mmol/L (ref 22–32)
Calcium: 8.6 mg/dL — ABNORMAL LOW (ref 8.9–10.3)
Chloride: 97 mmol/L — ABNORMAL LOW (ref 98–111)
Creatinine, Ser: 1.02 mg/dL (ref 0.61–1.24)
GFR, Estimated: >60 mL/min
Glucose, Bld: 97 mg/dL (ref 70–99)
Potassium: 3.8 mmol/L (ref 3.5–5.1)
Sodium: 134 mmol/L — ABNORMAL LOW (ref 135–145)
Total Bilirubin: 0.5 mg/dL (ref 0.3–1.2)
Total Protein: 6.2 g/dL — ABNORMAL LOW (ref 6.5–8.1)

## 2022-08-12 LAB — TROPONIN I (HIGH SENSITIVITY)
Troponin I (High Sensitivity): 8 ng/L (ref <18)
Troponin I (High Sensitivity): 7 ng/L (ref <18)

## 2022-08-12 MED ORDER — SODIUM CHLORIDE 0.9 % IV SOLN
500.0000 mg | Freq: Once | INTRAVENOUS | Status: AC
  Administered 2022-08-12: 500 mg via INTRAVENOUS
  Filled 2022-08-12: qty 5

## 2022-08-12 MED ORDER — AZITHROMYCIN 250 MG PO TABS: 250.0000 mg | ORAL_TABLET | Freq: Every day | ORAL | 0 refills | Status: AC

## 2022-08-12 MED ORDER — SODIUM CHLORIDE 0.9 % IV SOLN
2.0000 g | Freq: Once | INTRAVENOUS | Status: AC
  Administered 2022-08-12: 2 g via INTRAVENOUS
  Filled 2022-08-12: qty 20

## 2022-08-12 MED ORDER — CEFDINIR 300 MG PO CAPS: 300.0000 mg | ORAL_CAPSULE | Freq: Two times a day (BID) | ORAL | 0 refills | Status: AC

## 2022-08-12 NOTE — Discharge Instructions (Signed)
You were evaluated in the Emergency Department and after careful evaluation, we did not find any emergent condition requiring admission or further testing in the hospital.  Your exam/testing today is overall reassuring.  Symptoms seem to be due to pneumonia.  Take the antibiotics as prescribed and follow-up closely with your regular doctor.  Please return to the Emergency Department if you experience any worsening of your condition.   Thank you for allowing [place] to be a part of your care.

## 2022-08-12 NOTE — ED Provider Notes (Signed)
[facility]EMERGENCY DEPT [facility] Emergency Department Provider Note MRN:  [id number]  Arrival date & time: 08/12/22     Chief Complaint   Chest Pain (Patient to ED via EMS with complaint of chest pain x 2 days that woke him up from sleep tonight. Patient took 324 ASA and 2 SL nitroglycerin prior to ems.)   History of Present Illness   Glenn [name] is a 62 y.o. year-old male with a history of COPD presenting to the ED with chief complaint of chest pain.  Right-sided chest pain for the past few days.  Feels better now after aspirin with EMS.  Some cough recently.  Denies fever.  Pain is sharp.  Review of Systems  A thorough review of systems was obtained and all systems are negative except as noted in the HPI and PMH.   Patient's Health History    Past Medical History:  Diagnosis Date   Anxiety    Arthritis    Asthma    Chronic pain 05/06/2020   COPD (chronic obstructive pulmonary disease) (HCC)    CVA (cerebral vascular accident) (HCC)    2010; 06/2020   Depression    Diabetes mellitus type 2 in nonobese (HCC)    Dyspnea    ED (erectile dysfunction)    Hyperlipidemia    Hypertension    Sarcoid    Tobacco dependence 05/06/2020    Past Surgical History:  Procedure Laterality Date   LUNG SURGERY     removed tissue    Family History  Problem Relation Age of Onset   Hypertension Mother    Diabetes Maternal Grandmother    Colon cancer Neg Hx    Esophageal cancer Neg Hx    Rectal cancer Neg Hx    Inflammatory bowel disease Neg Hx    Liver disease Neg Hx    Pancreatic cancer Neg Hx     Social History   Socioeconomic History   Marital status: Married    Spouse name: Not on file   Number of children: Not on file   Years of education: Not on file   Highest education level: Some college, no degree  Occupational History   Occupation: disabled  Tobacco Use   Smoking status: Former    Packs/day: 1.00    Years: 34.00    Additional pack years: 0.00    Total pack  years: 34.00    Types: Cigarettes    Quit date: 05/11/2020    Years since quitting: 2.2    Passive exposure: Never   Smokeless tobacco: Never   Tobacco comments:    at the most smoked 6-7 cigarettes/day  Vaping Use   Vaping Use: Never used  Substance and Sexual Activity   Alcohol use: Never   Drug use: Never   Sexual activity: Not on file  Other Topics Concern   Not on file  Social History Narrative   Lives with wife   Social Determinants of Health   Financial Resource Strain: Not on file  Food Insecurity: No Food Insecurity (01/13/2022)   Hunger Vital Sign    Worried About Running Out of Food in the Last Year: Never true    Ran Out of Food in the Last Year: Never true  Transportation Needs: No Transportation Needs (01/13/2022)   PRAPARE - Administrator, Civil Service (Medical): No    Lack of Transportation (Non-Medical): No  Physical Activity: Not on file  Stress: Not on file  Social Connections: Not on file  Intimate  Partner Violence: Not At Risk (01/13/2022)   Humiliation, Afraid, Rape, and Kick questionnaire    Fear of Current or Ex-Partner: No    Emotionally Abused: No    Physically Abused: No    Sexually Abused: No     Physical Exam   Vitals:   08/12/22 0530 08/12/22 0545  BP: 115/86 116/86  Pulse: 91 94  Resp:  (!) 24  Temp: 98.1 F (36.7 C)   SpO2: 93% 94%    CONSTITUTIONAL: Chronically ill-appearing, NAD NEURO/PSYCH:  Alert and oriented x 3, no focal deficits EYES:  eyes equal and reactive ENT/NECK:  no LAD, no JVD CARDIO: Regular rate, well-perfused, normal S1 and S2 PULM:  CTAB no wheezing or rhonchi GI/GU:  non-distended, non-tender MSK/SPINE:  No gross deformities, no edema SKIN:  no rash, atraumatic   *Additional and/or pertinent findings included in MDM below  Diagnostic and Interventional Summary    EKG Interpretation Date/Time:  Friday August 12 2022 01:47:28 EDT Ventricular Rate:  93 PR Interval:  125 QRS Duration:  89 QT  Interval:  372 QTC Calculation: 463 R Axis:   39  Text Interpretation: Sinus rhythm Confirmed by Kennis Carina [phone]) on 08/12/2022 1:57:18 AM       Labs Reviewed  CBC WITH DIFFERENTIAL/PLATELET - Abnormal; Notable for the following components:      Result Value   Hemoglobin 12.7 (*)    All other components within normal limits  COMPREHENSIVE METABOLIC PANEL - Abnormal; Notable for the following components:   Sodium 134 (*)    Chloride 97 (*)    Calcium 8.6 (*)    Total Protein 6.2 (*)    Albumin 2.8 (*)    All other components within normal limits  TROPONIN I (HIGH SENSITIVITY)  TROPONIN I (HIGH SENSITIVITY)    DG Chest Port 1 View  Final Result      Medications  cefTRIAXone (ROCEPHIN) 2 g in sodium chloride 0.9 % 100 mL IVPB (0 g Intravenous Stopped 08/12/22 0448)  azithromycin (ZITHROMAX) 500 mg in sodium chloride 0.9 % 250 mL IVPB (500 mg Intravenous New Bag/Given 08/12/22 0459)     Procedures  /  Critical Care Procedures  ED Course and Medical Decision Making  Initial Impression and Ddx Patient in no acute distress experiencing right-sided sharp chest pain.  ACS considered the pain is a bit atypical.  Other considerations include pneumothorax, MSK, doubt PE.  Past medical/surgical history that increases complexity of ED encounter: COPD  Interpretation of Diagnostics I personally reviewed the Chest Xray and my interpretation is as follows: Possible pneumonia on the right, no pneumothorax  Labs reassuring with no significant blood count or electrolyte disturbance.  Troponin negative x 2.  Patient Reassessment and Ultimate Disposition/Management     Patient resting comfortably, maintaining oxygenation, had pneumonia last month and was treated with doxycycline.  Will provide different antibiotic regiment, no true indication for admission at this time, appropriate for discharge with strict return precautions.  Patient management required discussion with the following  services or consulting groups:  None  Complexity of Problems Addressed Acute illness or injury that poses threat of life of bodily function  Additional Data Reviewed and Analyzed Further history obtained from: Further history from spouse/family member  Additional Factors Impacting ED Encounter Risk Prescriptions and Consideration of hospitalization  Elmer Sow. Pilar Plate, MD [facility] Emergency Medicine [facility] [email or web address]  Final Clinical Impressions(s) / ED Diagnoses     ICD-10-CM   1. Pneumonia of right lung  due to infectious organism, unspecified part of lung  J18.9       ED Discharge Orders          Ordered    cefdinir (OMNICEF) 300 MG capsule  2 times daily        08/12/22 0558    azithromycin (ZITHROMAX) 250 MG tablet  Daily        08/12/22 0558             Discharge Instructions Discussed with and Provided to Patient:    Discharge Instructions      You were evaluated in the Emergency Department and after careful evaluation, we did not find any emergent condition requiring admission or further testing in the hospital.  Your exam/testing today is overall reassuring.  Symptoms seem to be due to pneumonia.  Take the antibiotics as prescribed and follow-up closely with your regular doctor.  Please return to the Emergency Department if you experience any worsening of your condition.   Thank you for allowing [place] to be a part of your care.      Sabas Sous, MD 08/12/22 0600

## 2022-08-30 ENCOUNTER — Other Ambulatory Visit (HOSPITAL_COMMUNITY): Payer: Self-pay

## 2022-09-15 ENCOUNTER — Ambulatory Visit (INDEPENDENT_AMBULATORY_CARE_PROVIDER_SITE_OTHER): Payer: Medicaid Other | Admitting: Podiatry

## 2022-09-15 DIAGNOSIS — Z91199 Patient's noncompliance with other medical treatment and regimen due to unspecified reason: Secondary | ICD-10-CM

## 2022-09-15 NOTE — Progress Notes (Signed)
No show for apt no charge

## 2022-10-04 ENCOUNTER — Other Ambulatory Visit (HOSPITAL_COMMUNITY): Payer: Self-pay

## 2022-10-13 ENCOUNTER — Emergency Department (HOSPITAL_BASED_OUTPATIENT_CLINIC_OR_DEPARTMENT_OTHER)
Admission: EM | Admit: 2022-10-13 | Discharge: 2022-10-13 | Disposition: A | Discharge status: Home / Self Care | Payer: Non-veteran care | Attending: Emergency Medicine | Admitting: Emergency Medicine

## 2022-10-13 ENCOUNTER — Encounter (HOSPITAL_BASED_OUTPATIENT_CLINIC_OR_DEPARTMENT_OTHER): Payer: Self-pay | Admitting: Emergency Medicine

## 2022-10-13 ENCOUNTER — Emergency Department (HOSPITAL_BASED_OUTPATIENT_CLINIC_OR_DEPARTMENT_OTHER): Payer: Non-veteran care

## 2022-10-13 ENCOUNTER — Other Ambulatory Visit: Payer: Self-pay

## 2022-10-13 DIAGNOSIS — R079 Chest pain, unspecified: Secondary | ICD-10-CM | POA: Diagnosis present

## 2022-10-13 DIAGNOSIS — R0602 Shortness of breath: Secondary | ICD-10-CM | POA: Diagnosis not present

## 2022-10-13 DIAGNOSIS — M5412 Radiculopathy, cervical region: Secondary | ICD-10-CM | POA: Diagnosis not present

## 2022-10-13 DIAGNOSIS — M542 Cervicalgia: Secondary | ICD-10-CM | POA: Diagnosis not present

## 2022-10-13 DIAGNOSIS — J441 Chronic obstructive pulmonary disease with (acute) exacerbation: Secondary | ICD-10-CM

## 2022-10-13 DIAGNOSIS — Z87891 Personal history of nicotine dependence: Secondary | ICD-10-CM | POA: Diagnosis not present

## 2022-10-13 LAB — CBC
HCT: 44.4 % (ref 39.0–52.0)
Hemoglobin: 14.2 g/dL (ref 13.0–17.0)
MCH: 27.3 pg (ref 26.0–34.0)
MCHC: 32 g/dL (ref 30.0–36.0)
MCV: 85.4 fL (ref 80.0–100.0)
Platelets: 295 10*3/uL (ref 150–400)
RBC: 5.2 MIL/uL (ref 4.22–5.81)
RDW: 14.6 % (ref 11.5–15.5)
WBC: 6.5 10*3/uL (ref 4.0–10.5)
nRBC: 0 % (ref 0.0–0.2)

## 2022-10-13 LAB — BASIC METABOLIC PANEL
Anion gap: 6 (ref 5–15)
BUN: 15 mg/dL (ref 8–23)
CO2: 31 mmol/L (ref 22–32)
Calcium: 9 mg/dL (ref 8.9–10.3)
Chloride: 104 mmol/L (ref 98–111)
Creatinine, Ser: 0.81 mg/dL (ref 0.61–1.24)
GFR, Estimated: >60 mL/min (ref >60)
Glucose, Bld: 97 mg/dL (ref 70–99)
Potassium: 3.7 mmol/L (ref 3.5–5.1)
Sodium: 141 mmol/L (ref 135–145)

## 2022-10-13 LAB — TROPONIN I (HIGH SENSITIVITY): Troponin I (High Sensitivity): 9 ng/L (ref <18)

## 2022-10-13 MED ORDER — ALBUTEROL SULFATE (2.5 MG/3ML) 0.083% IN NEBU: 2.5000 mg/h | INHALATION_SOLUTION | RESPIRATORY_TRACT | Status: DC

## 2022-10-13 MED ORDER — IOHEXOL 350 MG/ML SOLN
100.0000 mL | Freq: Once | INTRAVENOUS | Status: AC | PRN
  Administered 2022-10-13: 75 mL via INTRAVENOUS

## 2022-10-13 MED ORDER — ALBUTEROL SULFATE (2.5 MG/3ML) 0.083% IN NEBU
INHALATION_SOLUTION | RESPIRATORY_TRACT | Status: AC
  Administered 2022-10-13: 2.5 mg via RESPIRATORY_TRACT
  Filled 2022-10-13: qty 3

## 2022-10-13 MED ORDER — GUAIFENESIN ER 600 MG PO TB12: 1200.0000 mg | ORAL_TABLET | Freq: Two times a day (BID) | ORAL | 0 refills | Status: DC

## 2022-10-13 MED ORDER — ALBUTEROL SULFATE (2.5 MG/3ML) 0.083% IN NEBU: 2.5000 mg | INHALATION_SOLUTION | Freq: Once | RESPIRATORY_TRACT | Status: AC

## 2022-10-13 MED ORDER — METHYLPREDNISOLONE SODIUM SUCC 125 MG IJ SOLR
125.0000 mg | Freq: Once | INTRAMUSCULAR | Status: AC
  Administered 2022-10-13: 125 mg via INTRAVENOUS
  Filled 2022-10-13: qty 2

## 2022-10-13 MED ORDER — LEVOFLOXACIN 750 MG PO TABS: 750.0000 mg | ORAL_TABLET | Freq: Every day | ORAL | 0 refills | Status: DC

## 2022-10-13 MED ORDER — IPRATROPIUM-ALBUTEROL 0.5-2.5 (3) MG/3ML IN SOLN
RESPIRATORY_TRACT | Status: AC
  Administered 2022-10-13: 3 mL via RESPIRATORY_TRACT
  Filled 2022-10-13: qty 3

## 2022-10-13 MED ORDER — IPRATROPIUM-ALBUTEROL 0.5-2.5 (3) MG/3ML IN SOLN: 3.0000 mL | Freq: Four times a day (QID) | RESPIRATORY_TRACT | 0 refills | Status: DC | PRN

## 2022-10-13 MED ORDER — ALBUTEROL SULFATE (2.5 MG/3ML) 0.083% IN NEBU
INHALATION_SOLUTION | RESPIRATORY_TRACT | Status: AC
  Administered 2022-10-13: 10 mg/h via RESPIRATORY_TRACT
  Filled 2022-10-13: qty 12

## 2022-10-13 MED ORDER — IPRATROPIUM-ALBUTEROL 0.5-2.5 (3) MG/3ML IN SOLN: 3.0000 mL | Freq: Once | RESPIRATORY_TRACT | Status: AC

## 2022-10-13 MED ORDER — ALBUTEROL SULFATE (2.5 MG/3ML) 0.083% IN NEBU
INHALATION_SOLUTION | RESPIRATORY_TRACT | Status: AC
  Filled 2022-10-13: qty 6

## 2022-10-13 MED ORDER — PREDNISONE 20 MG PO TABS: 40.0000 mg | ORAL_TABLET | Freq: Every day | ORAL | 0 refills | Status: AC

## 2022-10-13 MED ORDER — MAGNESIUM SULFATE 2 GM/50ML IV SOLN
2.0000 g | Freq: Once | INTRAVENOUS | Status: AC
  Administered 2022-10-13: 2 g via INTRAVENOUS
  Filled 2022-10-13: qty 50

## 2022-10-13 NOTE — Discharge Instructions (Addendum)
Today I think that you are having a COPD exacerbation, I recommend that you take the prednisone as prescribed outpatient, as well as the antibiotic.  Please return to the ER if you feel like your symptoms are worsening.  Additionally your chest pain may be secondary to your frequent coughing, however I need you to follow-up with your primary care doctor for further evaluation.  Return if your symptoms worsen.  You can also use Mucinex, to help with your symptoms.

## 2022-10-13 NOTE — Progress Notes (Signed)
Pt accidentally spilled some of the CAT RT place 5 mg to finish his CAT.

## 2022-10-13 NOTE — ED Triage Notes (Signed)
Pt arrives to ED with c/o on-going/worsening SOB and chest pains that started x1 week ago.

## 2022-10-13 NOTE — Progress Notes (Signed)
RT ordered a CAT due to the Pt being very tight and some wheezes

## 2022-10-13 NOTE — ED Provider Notes (Signed)
[place] EMERGENCY DEPARTMENT AT [facility] Provider Note   CSN: [id number] Arrival date & time: 10/13/22  1437     History  Chief Complaint  Patient presents with   Chest Pain   Shortness of Breath    Glenn [name] is a 62 y.o. male, history of pulmonary sarcoidosis, tobacco abuse, COPD, who presents to the ED secondary to shortness of breath, and a cough, has been going on for the last 2 months after being discharged and treated for pneumonia.  He states that his shortness of breath, cough improved, but has been persistent.  He notes that he also developed some right chest pain, that started about 2 weeks ago, as well as some right arm numbness, that is intermittent.  He states his chest pain, is on the right side of his chest, and worse with movement.  Also states that he has some right arm numbness, worse when moving his arms, or lifting things.  He notes that he also has a little bit of neck pain.  Denies any particularly heavy lifting.  Denies any kind of nausea, vomiting.   Home Medications Prior to Admission medications   Medication Sig Start Date End Date Taking? Authorizing Provider  guaiFENesin (MUCINEX) 600 MG 12 hr tablet Take 2 tablets (1,200 mg total) by mouth 2 (two) times daily. 10/13/22  Yes Quincy Prisco L, PA  ipratropium-albuterol (DUONEB) 0.5-2.5 (3) MG/3ML SOLN Take 3 mLs by nebulization every 6 (six) hours as needed. 10/13/22  Yes Margaret Cockerill L, PA  levofloxacin (LEVAQUIN) 750 MG tablet Take 1 tablet (750 mg total) by mouth daily. 10/13/22  Yes Jenya Putz L, PA  predniSONE (DELTASONE) 20 MG tablet Take 2 tablets (40 mg total) by mouth daily for 5 days. 10/13/22 10/18/22 Yes Lashina Milles L, PA  albuterol (PROAIR HFA) 108 (90 Base) MCG/ACT inhaler 2 puffs every 4 hours as needed only  if your can't catch your breath Patient taking differently: Inhale 2 puffs into the lungs every 6 (six) hours as needed for shortness of breath. 11/11/20   [name], MD   albuterol (PROVENTIL) (2.5 MG/3ML) 0.083% nebulizer solution Take 3 mLs (2.5 mg total) by nebulization every 4 (four) hours as needed for wheezing or shortness of breath. 11/19/21   Ghimire, Werner Lean, MD  AMITIZA 24 MCG capsule Take 24 mcg by mouth daily as needed for constipation. 03/31/20   [provider]  amLODipine (NORVASC) 10 MG tablet Take 1 tablet (10 mg total) by mouth daily. 10/03/21   Rai, Delene Ruffini, MD  Ascorbic Acid (VITAMIN C PO) Take 2 tablets by mouth daily.    [provider]  atorvastatin (LIPITOR) 40 MG tablet Take 1 tablet (40 mg total) by mouth daily. 06/26/20   Pokhrel, Rebekah Chesterfield, MD  budesonide-formoterol (SYMBICORT) 160-4.5 MCG/ACT inhaler Inhale 2 puffs into the lungs 2 (two) times daily. 07/23/21   [name], MD  clopidogrel (PLAVIX) 75 MG tablet Take 75 mg by mouth daily. 07/06/21   [provider]  Dextromethorphan HBr 15 MG TABS 30 mg orally every 6 to 8 hours; MAX, 120 mg/day 01/15/22   Arthor Captain, PA-C  famotidine (PEPCID) 20 MG tablet One after supper Patient taking differently: Take 20 mg by mouth daily. 11/22/21   [name], MD  fluticasone (FLONASE) 50 MCG/ACT nasal spray Place 2 sprays into both nostrils daily. 11/19/21   Ghimire, Werner Lean, MD  gabapentin (NEURONTIN) 300 MG capsule Take 1 capsule (300 mg total) by  mouth 3 (three) times daily. Patient taking differently: Take 600 mg by mouth daily. 02/09/18   Eustace Moore, MD  loratadine (CLARITIN) 10 MG tablet Take 1 tablet (10 mg total) by mouth daily. 01/03/22     losartan-hydrochlorothiazide (HYZAAR) 100-12.5 MG tablet Take 1 tablet by mouth daily. 09/03/21   [provider]  metFORMIN (GLUCOPHAGE) 500 MG tablet Take 1 tablet (500 mg total) by mouth 2 (two) times daily with a meal. 10/03/21   Rai, Ripudeep K, MD  naloxone [facility]) nasal spray 4 mg/0.1 mL SMARTSIG:Both Nares 08/20/21   [provider]  nitroGLYCERIN (NITROSTAT) 0.4 MG SL tablet Place  0.4 mg under the tongue every 5 (five) minutes as needed for chest pain.    [provider]  Oxycodone HCl 20 MG TABS Take 20 mg by mouth 4 (four) times daily. 03/31/20   [provider]  pantoprazole (PROTONIX) 40 MG tablet Take 1 tablet (40 mg total) by mouth daily. 07/02/19   Elgergawy, Leana Roe, MD  promethazine-dextromethorphan (PROMETHAZINE-DM) 6.25-15 MG/5ML syrup Take 15 mLs by mouth every 12 (twelve) hours.    [provider]  sildenafil (VIAGRA) 100 MG tablet Take 1 tablet (100 mg total) by mouth daily as needed. 03/02/22     tamsulosin (FLOMAX) 0.4 MG CAPS capsule Take 0.4 mg by mouth daily. 04/26/21   [provider]  Tiotropium Bromide Monohydrate 2.5 MCG/ACT AERS Inhale 2 each into the lungs daily.    [provider]  Tiotropium Bromide-Olodaterol (STIOLTO RESPIMAT) 2.5-2.5 MCG/ACT AERS Inhale 2 puffs into the lungs daily. 01/18/22   [name], MD  Vitamin D, Ergocalciferol, (DRISDOL) 1.25 MG (50000 UNIT) CAPS capsule Take 50,000 Units by mouth once a week. Mondays 05/02/19   [provider]  XTAMPZA ER 9 MG C12A Take 9 mg by mouth 2 (two) times daily. 04/09/21   [provider]      Allergies    Insulins, Ibuprofen, Pork-derived products, Shrimp [shellfish allergy], Tylenol [acetaminophen], and Lisinopril    Review of Systems   Review of Systems  Respiratory:  Positive for shortness of breath.   Cardiovascular:  Positive for chest pain.    Physical Exam Updated Vital Signs BP (!) 148/104   Pulse (!) 102   Temp 98.2 F (36.8 C) (Oral)   Resp (!) 25   Ht 5\' 5"  (1.651 m)   Wt 64.4 kg   SpO2 99%   BMI 23.63 kg/m  Physical Exam Vitals and nursing note reviewed.  Constitutional:      General: He is not in acute distress.    Appearance: He is well-developed.  HENT:     Head: Normocephalic and atraumatic.  Eyes:     Conjunctiva/sclera: Conjunctivae normal.  Cardiovascular:     Rate and Rhythm: Normal rate  and regular rhythm.     Pulses:          Radial pulses are 2+ on the right side and 2+ on the left side.     Heart sounds: No murmur heard. Pulmonary:     Effort: Pulmonary effort is normal. No respiratory distress.     Breath sounds: Rales present.  Chest:     Comments: Tenderness to palpation of right chest wall.  No evidence of any kind of rash, edema, or ecchymosis. Abdominal:     Palpations: Abdomen is soft.     Tenderness: There is no abdominal tenderness.  Musculoskeletal:        General: No swelling.  Cervical back: Neck supple.     Comments: Arm pain/numbness, exacerbated with abduction, internal and external rotation  Skin:    General: Skin is warm and dry.     Capillary Refill: Capillary refill takes less than 2 seconds.  Neurological:     Mental Status: He is alert.  Psychiatric:        Mood and Affect: Mood normal.     ED Results / Procedures / Treatments   Labs (all labs ordered are listed, but only abnormal results are displayed) Labs Reviewed  BASIC METABOLIC PANEL  CBC  TROPONIN I (HIGH SENSITIVITY)  TROPONIN I (HIGH SENSITIVITY)    EKG EKG Interpretation Date/Time:  Thursday October 13 2022 14:51:48 EDT Ventricular Rate:  107 PR Interval:  129 QRS Duration:  82 QT Interval:  333 QTC Calculation: 445 R Axis:   40  Text Interpretation: Sinus tachycardia Abnormal R-wave progression, early transition Borderline T wave abnormalities Confirmed by Edwin Dada ([id number]) on 10/13/2022 3:04:59 PM  Radiology CT Angio Chest PE W and/or Wo Contrast  Result Date: 10/13/2022 CLINICAL DATA:  Chest pain. Tachycardia and shortness of breath. History of pneumonia. EXAM: CT ANGIOGRAPHY CHEST WITH CONTRAST TECHNIQUE: Multidetector CT imaging of the chest was performed using the standard protocol during bolus administration of intravenous contrast. Multiplanar CT image reconstructions and MIPs were obtained to evaluate the vascular anatomy. RADIATION DOSE REDUCTION: This  exam was performed according to the departmental dose-optimization program which includes automated exposure control, adjustment of the mA and/or kV according to patient size and/or use of iterative reconstruction technique. CONTRAST:  75mL OMNIPAQUE IOHEXOL 350 MG/ML SOLN COMPARISON:  Radiograph earlier today. FINDINGS: Cardiovascular: There are no filling defects within the pulmonary arteries to suggest pulmonary embolus. Aortic tortuosity without aneurysm. Upper normal heart size. No pericardial effusion. Mediastinum/Nodes: Postsurgical change at both hila. No adenopathy. No thyroid nodule. Decompressed esophagus. Lungs/Pleura: Postsurgical change in both lungs with areas of scarring. Background emphysema. Moderate bronchial thickening, chronic but mildly increased. Upper lobe architectural distortion and bandlike scarring, stable. No acute or confluent airspace disease. No pleural fluid. No features of pulmonary edema. No endobronchial debris. Upper Abdomen: No acute findings. Musculoskeletal: There are no acute or suspicious osseous abnormalities. Postsurgical change of the ribs. Review of the MIP images confirms the above findings. IMPRESSION: 1. No pulmonary embolus. 2. Postsurgical change in both lungs with areas of scarring. 3. Moderate bronchial thickening, chronic but mildly increased from prior. Background emphysema and architectural distortion, stable. Electronically Signed   By: Narda Rutherford M.D.   On: 10/13/2022 18:47   CT Cervical Spine Wo Contrast  Result Date: 10/13/2022 CLINICAL DATA:  Neck trauma, focal neuro deficit or paresthesia (Age 58-64y) EXAM: CT CERVICAL SPINE WITHOUT CONTRAST TECHNIQUE: Multidetector CT imaging of the cervical spine was performed without intravenous contrast. Multiplanar CT image reconstructions were also generated. RADIATION DOSE REDUCTION: This exam was performed according to the departmental dose-optimization program which includes automated exposure control,  adjustment of the mA and/or kV according to patient size and/or use of iterative reconstruction technique. COMPARISON:  None Available. FINDINGS: Alignment: Straightening of the normal cervical lordosis. Skull base and vertebrae: There is radial remodeling of the cervical vertebral bodies. Vertebral body heights are maintained. Lucent lesion along the posterior aspect of the C6 vertebral body is favored to be degenerative. Soft tissues and spinal canal: No prevertebral fluid or swelling. No visible canal hematoma. Disc levels:  No evidence of high-grade spinal canal stenosis. Upper chest: See separately dictated CT chest  for thoracic findings. Other: None. IMPRESSION: No acute fracture or traumatic listhesis. Electronically Signed   By: Lorenza Cambridge M.D.   On: 10/13/2022 18:33   DG Chest Port 1 View  Result Date: 10/13/2022 CLINICAL DATA:  Shortness of breath EXAM: PORTABLE CHEST 1 VIEW COMPARISON:  X-ray 08/11/2022 FINDINGS: Slight elevation of the left hemidiaphragm. There is some basilar atelectasis. No pneumothorax, effusion. No consolidation. No edema. Surgical changes along the left hilum and along both midlung zones. Overlapping cardiac leads. IMPRESSION: Underinflation. Postsurgical changes. Slightly elevated left hemidiaphragm with with some adjacent atelectasis or scar. Electronically Signed   By: Karen Kays M.D.   On: 10/13/2022 16:17    Procedures Procedures   Medications Ordered in ED Medications  albuterol (PROVENTIL) (2.5 MG/3ML) 0.083% nebulizer solution (10 mg/hr Nebulization New Bag/Given 10/13/22 1455)  iohexol (OMNIPAQUE) 350 MG/ML injection 100 mL (75 mLs Intravenous Contrast Given 10/13/22 1702)  methylPREDNISolone sodium succinate (SOLU-MEDROL) 125 mg/2 mL injection 125 mg (125 mg Intravenous Given 10/13/22 1716)  magnesium sulfate IVPB 2 g 50 mL (2 g Intravenous New Bag/Given 10/13/22 1721)  albuterol (PROVENTIL) (2.5 MG/3ML) 0.083% nebulizer solution 2.5 mg (3 mg Nebulization Not  Given 10/13/22 1707)  ipratropium-albuterol (DUONEB) 0.5-2.5 (3) MG/3ML nebulizer solution 3 mL (3 mLs Nebulization Given 10/13/22 1706)    ED Course/ Medical Decision Making/ A&P             HEART Score: 4                    Medical Decision Making Patient is a 62 year old male, here for chest pain, has been going on for last couple weeks, he states he also has been short of breath, and having sputum production, since being treated for pneumonia.  He states it is persistent.  He has chest wall tenderness to palpation, also reports some right arm numbness, but also has neck pain, and pain and/numbness in his arm is worse with range of motion.  I am specific suspicious for cervical radiculopathy.  He has had this persistent cough, now this chest pain and he is tachycardic we will obtain a PE study, to rule out an count of PE or mass.  Additionally will treat him with some magnesium, Solu-Medrol and albuterol, as he has crackles throughout and is complaining of shortness of breath.  Respiratory therapy states before putting on the albuterol, he had no breath sounds.  Amount and/or Complexity of Data Reviewed Labs: ordered.    Details: No acute abnormalities, troponin within normal limits Radiology: ordered.    Details: Chest x-ray clear, CTA shows worsening bronchial thickening ECG/medicine tests:  Decision-making details documented in ED Course. Discussion of management or test interpretation with external provider(s): Discussed with patient, bronchial thickening is worsening, possible COPD exacerbation, versus worsening bronchitis chronic, I will have him follow-up with his PCP, and treat him with prednisone, Levaquin, as well as DuoNebs outpatient.  His breathing is much improved, and he has good air movement, and reduced crackles.  He is overall well-appearing, given the crackles are bilateral, I have low suspicion for any kind of pneumonia, but will treat with Levaquin given COPD exacerbation.  He  voiced understanding and was in agreement with plan.  Unclear what is causing the chest pain, I advised him to follow-up with his PCP in regards to this, and discussed that it may be secondary to the frequent cough that he has for the bronchitis.  Suspicious that his arm numbness, is likely secondary to  radiculopathy since it changes with movement, and he does have some neck pain, C-spine, showed no acute fractures, or bulging disc.  But there was posterior calcification along the C6  Risk OTC drugs. Prescription drug management.    Final Clinical Impression(s) / ED Diagnoses Final diagnoses:  COPD exacerbation (HCC)  Chest pain, unspecified type  Cervical radiculopathy    Rx / DC Orders ED Discharge Orders          Ordered    predniSONE (DELTASONE) 20 MG tablet  Daily        10/13/22 1900    levofloxacin (LEVAQUIN) 750 MG tablet  Daily        10/13/22 1900    ipratropium-albuterol (DUONEB) 0.5-2.5 (3) MG/3ML SOLN  Every 6 hours PRN        10/13/22 1900    guaiFENesin (MUCINEX) 600 MG 12 hr tablet  2 times daily        10/13/22 1902              Pete Pelt, [place] 10/13/22 1906    Gwyneth Sprout, MD 10/13/22 2144

## 2022-10-13 NOTE — Progress Notes (Signed)
Rt evaluated the Pt and he has airflow in his lungs. The Pt is still wheezing but his breath sounds are better.

## 2022-10-19 ENCOUNTER — Ambulatory Visit (HOSPITAL_BASED_OUTPATIENT_CLINIC_OR_DEPARTMENT_OTHER)
Admission: RE | Admit: 2022-10-19 | Discharge: 2022-10-19 | Disposition: A | Discharge status: Home / Self Care | Payer: Medicaid Other | Source: Ambulatory Visit | Attending: Internal Medicine | Admitting: Internal Medicine

## 2022-10-19 DIAGNOSIS — Z87891 Personal history of nicotine dependence: Secondary | ICD-10-CM | POA: Insufficient documentation

## 2022-10-19 DIAGNOSIS — Z122 Encounter for screening for malignant neoplasm of respiratory organs: Secondary | ICD-10-CM | POA: Diagnosis present

## 2022-10-24 ENCOUNTER — Other Ambulatory Visit (HOSPITAL_COMMUNITY): Payer: Self-pay

## 2022-10-28 ENCOUNTER — Other Ambulatory Visit (HOSPITAL_COMMUNITY): Payer: Self-pay

## 2022-11-03 ENCOUNTER — Other Ambulatory Visit: Payer: Self-pay

## 2022-11-03 DIAGNOSIS — Z122 Encounter for screening for malignant neoplasm of respiratory organs: Secondary | ICD-10-CM

## 2022-11-03 DIAGNOSIS — Z87891 Personal history of nicotine dependence: Secondary | ICD-10-CM

## 2022-11-09 NOTE — Progress Notes (Signed)
Subjective:     Patient ID: Glenn [name], male   DOB: Jun 09, 1960     MRN: [id number]     Brief patient profile:  64  yobm quit smoking 09/2020   with onset of symptoms in 1995 while in [place] dx as asthma vs sarcoid and rx pred/ theoph / albuterol seen by allergist dx as pollen moved to [place] where underwent bilateral lung surgery 2011 ? Lung vol reduction ? Helped breathing at the time but uses [facility] parking and  [facility] = can't walk a nl pace on a flat grade s sob but does fine slow and flat so referred to pulmonary clinic 10/05/2016 by Dr  Glenn [name]      History of Present Illness  10/05/2016 1st [name] visit/ Glenn [name]   Chief Complaint  Patient presents with   Pulmonary Consult    Referred by Dr. Alveta [name] for eval of Asthma and COPD. Pt states he was dxed with COPD and Asthma in 1995. He moved here a year ago from [place], [place]. He states his breathing is "okay" today. He states he has a "slight cold"- prod cough with minimal brown sputum.    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  Work on [employer] B = Backup Only use your albuterol (proair) as a rescue medication Plan C = Crisis - only use your albuterol nebulizer if you first try Plan B and it fails to help > ok to use the nebulizer up to every 4 hours but if start needing it regularly call for immediate appointment Please schedule a follow up [name] visit in 6 weeks, call sooner if needed with pfts    Admit 09/29/21 and 11/17/21  and tussionex made  the difference   01/18/2022  f/u ov/Glenn [name] re: GOLD 2  maint on symbicort 160  / spiriva one (rec was 2) puffs each am Chief Complaint  Patient presents with   Follow-up    Pt states he was in hospital for about 3 days since LOV. Pt was discharged with Oxygen but has not used it.   Dyspnea:  not pushing buggy, riding scooter instead / no longer doing PT Cough: minimal rattling but can be severe and worse at bedtime / nothing purulent now  Sleeping: 30 degrees bed  SABA use: twice daily neb  02: not using  Rec Plan A = Automatic = Always=    symbicort 160 2 first thing in am / chase with spriiva 2.5 x 2 puff and 12 hours later symbicort 160 x 2 puffs Work on inhaler technique Plan B = Backup (to supplement plan A, not to replace it) Only use your albuterol inhaler as a rescue medication Plan C = Crisis (instead of Plan B but only if Plan B stops working) - only use your albuterol nebulizer if you first try Plan B For  cough > mucinex 1200 mg every 12 hours and take  oxycodone up to every 4 hours  or hydrocodone every 4 hours as needed   Please schedule a follow up [name] visit in 6 weeks, call sooner if needed with all medications /inhalers/ solutions in hand     03/01/2022  f/u ov/Glenn [name] re: GOLD 2   maint on GOLD 2  spiriva/ symbicort 160 Chief Complaint  Patient presents with   Follow-up    Doing well.  Dyspnea:  limited by back not breathing  Cough: none  Sleeping: 30 degree bed SABA use: not all  02: none  Covid status:   never vax/ never infected  Lung cancer screening :  in program  Rec No change in medications Work on inhaler technique: Will stop your oxygen   11/10/2022  f/u ov/Glenn [name]/Glenn [name] re: GOLD 2 copd  maint on symb/spiriva  Chief Complaint  Patient presents with   Follow-up    Doing well.  Does have some wheezing and cough in mornings.  Needs new nebulizer machine.   Dyspnea:  back limits activity  /  uses scooter most of the time / does walk at  grocery  ok if very slowly  Cough:some wheezing/ congestion in am  Sleeping: 30 degress s    resp cc  SABA use: none  02: not using x after exertion.  [facility]    No obvious day to day or daytime variability or assoc excess/ purulent sputum or mucus plugs or hemoptysis or cp or chest tightness, subjective wheeze or overt sinus or hb symptoms.    Also denies any obvious fluctuation of symptoms with weather or environmental changes or other aggravating or alleviating factors except as outlined above   No unusual exposure hx or h/o childhood pna/ asthma or knowledge of premature birth.  Current Allergies, Complete Past Medical History, Past Surgical History, Family History, and Social History were reviewed in [employer] record.  ROS  The following are not active complaints unless bolded Hoarseness, sore throat, dysphagia, dental problems, itching, sneezing,  nasal congestion or discharge of excess mucus or purulent secretions, ear ache,   fever, chills, sweats, unintended wt loss or wt gain, classically pleuritic or exertional cp,  orthopnea pnd or arm/hand swelling  or leg swelling, presyncope, palpitations, abdominal pain, anorexia, nausea, vomiting, diarrhea  or change in bowel habits or change in bladder habits, change in stools or change in urine, dysuria, hematuria,  rash, arthralgias, visual complaints, headache, numbness, weakness or ataxia or problems with walking or coordination,  change in mood or  memory.        Current Meds  Medication Sig   albuterol (PROAIR HFA) 108 (90 Base) MCG/ACT inhaler 2 puffs every 4 hours as needed only  if your can't catch your breath (Patient taking differently: Inhale 2 puffs into the lungs every 6 (six) hours as needed for shortness of breath.)   albuterol (PROVENTIL) (2.5 MG/3ML) 0.083% nebulizer solution Take 3 mLs (2.5 mg total) by nebulization every 4 (four) hours as needed for wheezing or shortness of  breath.   AMITIZA 24 MCG capsule Take 24 mcg by mouth daily as needed for constipation.   amLODipine (NORVASC) 10 MG tablet Take 1 tablet (10 mg total) by mouth daily.   Ascorbic Acid (VITAMIN C PO) Take 2 tablets by mouth daily.   atorvastatin (LIPITOR) 40 MG tablet Take 1 tablet (40 mg total) by mouth daily.   budesonide-formoterol (SYMBICORT) 160-4.5  MCG/ACT inhaler Inhale 2 puffs into the lungs 2 (two) times daily.   clopidogrel (PLAVIX) 75 MG tablet Take 75 mg by mouth daily.   Dextromethorphan HBr 15 MG TABS 30 mg orally every 6 to 8 hours; MAX, 120 mg/day   famotidine (PEPCID) 20 MG tablet One after supper (Patient taking differently: Take 20 mg by mouth daily.)   fluticasone (FLONASE) 50 MCG/ACT nasal spray Place 2 sprays into both nostrils daily.   gabapentin (NEURONTIN) 300 MG capsule Take 1 capsule (300 mg total) by mouth 3 (three) times daily. (Patient taking differently: Take 600 mg by mouth daily.)   guaiFENesin (MUCINEX) 600 MG 12 hr tablet Take 2 tablets (1,200 mg total) by mouth 2 (two) times daily.   ipratropium-albuterol (DUONEB) 0.5-2.5 (3) MG/3ML SOLN Take 3 mLs by nebulization every 6 (six) hours as needed.   levofloxacin (LEVAQUIN) 750 MG tablet Take 1 tablet (750 mg total) by mouth daily.   loratadine (CLARITIN) 10 MG tablet Take 1 tablet (10 mg total) by mouth daily.   losartan-hydrochlorothiazide (HYZAAR) 100-12.5 MG tablet Take 1 tablet by mouth daily.   metFORMIN (GLUCOPHAGE) 500 MG tablet Take 1 tablet (500 mg total) by mouth 2 (two) times daily with a meal.   naloxone (NARCAN) nasal spray 4 mg/0.1 mL SMARTSIG:Both Nares   nitroGLYCERIN (NITROSTAT) 0.4 MG SL tablet Place 0.4 mg under the tongue every 5 (five) minutes as needed for chest pain.   Oxycodone HCl 20 MG TABS Take 20 mg by mouth 4 (four) times daily.   pantoprazole (PROTONIX) 40 MG tablet Take 1 tablet (40 mg total) by mouth daily.   promethazine-dextromethorphan (PROMETHAZINE-DM) 6.25-15 MG/5ML syrup Take  15 mLs by mouth every 12 (twelve) hours.   sildenafil (VIAGRA) 100 MG tablet Take 1 tablet (100 mg total) by mouth daily as needed.   tamsulosin (FLOMAX) 0.4 MG CAPS capsule Take 0.4 mg by mouth daily.   Tiotropium Bromide Monohydrate 2.5 MCG/ACT AERS Inhale 2 each into the lungs daily.   Tiotropium Bromide-Olodaterol (STIOLTO RESPIMAT) 2.5-2.5 MCG/ACT AERS Inhale 2 puffs into the lungs daily.   Vitamin D, Ergocalciferol, (DRISDOL) 1.25 MG (50000 UNIT) CAPS capsule Take 50,000 Units by mouth once a week. Mondays   XTAMPZA ER 9 MG C12A Take 9 mg by mouth 2 (two) times daily.            Objective:   Physical Exam   Wts  11/10/2022        143  03/01/2022        151  01/18/2022      148 11/22/2021      146   08/16/2021        149  11/11/2020        147  08/11/2020          144 01/15/2020        140 03/26/2019        144  11/20/2018      144  08/14/2018          144  02/08/2018          143  11/22/17 142 lb (64.4 kg)  10/05/16 131 lb 12.8 oz (59.8 kg)    Vital signs reviewed  11/10/2022  - Note at rest 02 sats  91% on RA   General appearance:    pleasant amb bm nad  HEENT :  Oropharynx  clear   Nasal turbinates nl    NECK :  without JVD/Nodes/TM/ nl carotid upstrokes bilaterally  LUNGS: no acc muscle use,  Mod barrel  contour chest wall with bilateral  Distant bs s audible wheeze and  without cough on insp or exp maneuvers and mod  Hyperresonant  to  percussion bilaterally     CV:  RRR  no s3 or murmur or increase in P2, and no edema   ABD:  soft and nontender    MS:   Ext warm without deformities or   obvious joint restrictions , calf tenderness, cyanosis or clubbing  SKIN: warm and dry without lesions    NEURO:  alert, approp, nl sensorium with  no motor or cerebellar deficits apparent.             I personally reviewed images and agree with radiology impression as follows:   Chest LDSCT   91/24 1. Lung-RADS 1, negative. Continue annual screening with low-dose chest CT  without contrast in 12 months. 2. Pulmonary parenchymal sarcoid, stable. 3. Coronary artery calcification. 4.  Emphysema (ICD10-J43.9).    Marland Kitchen  Assessment:

## 2022-11-10 ENCOUNTER — Encounter: Payer: Self-pay | Admitting: Internal Medicine

## 2022-11-10 ENCOUNTER — Ambulatory Visit (INDEPENDENT_AMBULATORY_CARE_PROVIDER_SITE_OTHER): Payer: No Typology Code available for payment source | Admitting: Internal Medicine

## 2022-11-10 VITALS — BP 140/84 | HR 98 | Temp 97.1°F | Ht 65.0 in | Wt 143.4 lb

## 2022-11-10 DIAGNOSIS — J449 Chronic obstructive pulmonary disease, unspecified: Secondary | ICD-10-CM

## 2022-11-10 NOTE — Patient Instructions (Signed)
Make sure you check your oxygen saturation  AT  your highest level of activity (not after you stop)   to be sure it stays over 90% and adjust  02 flow upward to maintain this level if needed but remember to turn it back to previous settings when you stop (to conserve your supply).   Plan A = Automatic = Always=    spiriva / symbicort  2 puffs each am and repeat just the symbicort in pm   Plan B = Backup (to supplement plan A, not to replace it) Only use your albuterol inhaler as a rescue medication to be used if you can't catch your breath by resting or doing a relaxed purse lip breathing pattern.  - The less you use it, the better it will work when you need it. - Ok to use the inhaler up to 2 puffs  every 4 hours if you must but call for appointment if use goes up over your usual need - Don't leave home without it !!  (think of it like the spare tire for your car)   Plan C = Crisis (instead of Plan B but only if Plan B stops working) - only use your albuterol nebulizer if you first try Plan B and it fails to help > ok to use the nebulizer up to every 4 hours but if start needing it regularly call for immediate appointment  We can provide [facility] a [facility]    Please schedule a follow up visit in 3 months but call sooner if needed  with all medications /inhalers/ solutions in hand so we can verify exactly what you are taking. This includes all medications from all doctors and over the counters

## 2022-11-12 NOTE — Assessment & Plan Note (Addendum)
Quit smoking 05/2020    - Spirometry 10/05/2016  FEV1 1.36 (52%)  Ratio 65 p saba with classic curvature - 10/05/2016  > try dulera 200 2bid   - Allergy profile 11/22/2017 >  Eos 0.6 /  IgE  25 RAST neg x for roaches/ neg for mold  - 11/22/2017   continue dulera 200 2bid  - PFT's  02/08/2018  FEV1 0.90 (36 % ) ratio 59   p multiple saba prior to study with DLCO  52 % corrects to 106  % for alv volume   02/08/2018  After extensive coaching inhaler device,  effectiveness =    90% from a baseline of 75% with smi so try spiriva respimat - 08/14/2018    changed to trelegy but preferred symb/spriva - 11/20/2018     Breztri > better but not covered by medicaid - 03/26/2019  After extensive coaching inhaler device,  effectiveness =    75% (short Ti) > rechallenge with Breztri > not covered, resumed symb 160 but only took prn - 01/15/2020 re-started on symb 160/spiriva 2.5 daily  - 08/11/2020  After extensive coaching inhaler device,  effectiveness =    75% > continue symbicort/ spiriva - PFT's  08/11/2020  FEV1 1.34 (54 % ) ratio 0.61  p 10 % improvement from saba p spiriva prior to study with DLCO  11.94 (52%) corrects to 3.41 (77%)  for alv volume and FV curve classic concavity   - 11/22/2021   continue symb/spiriva add max gerd rx and f/u in 6 weeks  - 11/10/2022  After extensive coaching inhaler device,  effectiveness =    75% (short ti)    Group D (now reclassified as E) in terms of symptom/risk and laba/lama/ICS  therefore appropriate rx at this point >>>  continue symb / spirva and approp saba and monitor sats for ex hypoxemia  F/u in 3 m sooner if needed         Each maintenance medication was reviewed in detail including emphasizing most importantly the difference between maintenance and prns and under what circumstances the prns are to be triggered using an action plan format where appropriate.  Total time for H and P, chart review, counseling, reviewing hfa/neb/ pulse ox  device(s) and generating  customized AVS unique to this office visit / same day charting = 30

## 2022-12-08 ENCOUNTER — Other Ambulatory Visit (HOSPITAL_COMMUNITY): Payer: Self-pay

## 2022-12-17 ENCOUNTER — Emergency Department (HOSPITAL_COMMUNITY)
Admission: EM | Admit: 2022-12-17 | Discharge: 2022-12-17 | Disposition: A | Discharge status: Home / Self Care | Payer: No Typology Code available for payment source | Attending: Emergency Medicine | Admitting: Emergency Medicine

## 2022-12-17 ENCOUNTER — Emergency Department (HOSPITAL_COMMUNITY): Payer: No Typology Code available for payment source

## 2022-12-17 DIAGNOSIS — Z1152 Encounter for screening for COVID-19: Secondary | ICD-10-CM | POA: Diagnosis not present

## 2022-12-17 DIAGNOSIS — J45901 Unspecified asthma with (acute) exacerbation: Secondary | ICD-10-CM | POA: Insufficient documentation

## 2022-12-17 DIAGNOSIS — J441 Chronic obstructive pulmonary disease with (acute) exacerbation: Secondary | ICD-10-CM | POA: Insufficient documentation

## 2022-12-17 DIAGNOSIS — Z7984 Long term (current) use of oral hypoglycemic drugs: Secondary | ICD-10-CM | POA: Insufficient documentation

## 2022-12-17 DIAGNOSIS — Z87891 Personal history of nicotine dependence: Secondary | ICD-10-CM | POA: Insufficient documentation

## 2022-12-17 DIAGNOSIS — I10 Essential (primary) hypertension: Secondary | ICD-10-CM | POA: Diagnosis not present

## 2022-12-17 DIAGNOSIS — Z85038 Personal history of other malignant neoplasm of large intestine: Secondary | ICD-10-CM | POA: Diagnosis not present

## 2022-12-17 DIAGNOSIS — Z79899 Other long term (current) drug therapy: Secondary | ICD-10-CM | POA: Diagnosis not present

## 2022-12-17 DIAGNOSIS — Z7951 Long term (current) use of inhaled steroids: Secondary | ICD-10-CM | POA: Diagnosis not present

## 2022-12-17 DIAGNOSIS — Z7902 Long term (current) use of antithrombotics/antiplatelets: Secondary | ICD-10-CM | POA: Insufficient documentation

## 2022-12-17 DIAGNOSIS — E119 Type 2 diabetes mellitus without complications: Secondary | ICD-10-CM | POA: Insufficient documentation

## 2022-12-17 DIAGNOSIS — R0602 Shortness of breath: Secondary | ICD-10-CM | POA: Diagnosis present

## 2022-12-17 LAB — CBC WITH DIFFERENTIAL/PLATELET
Abs Immature Granulocytes: 0.01 10*3/uL (ref 0.00–0.07)
Basophils Absolute: 0.1 10*3/uL (ref 0.0–0.1)
Basophils Relative: 1 %
Eosinophils Absolute: 0.5 10*3/uL (ref 0.0–0.5)
Eosinophils Relative: 10 %
HCT: 42.2 % (ref 39.0–52.0)
Hemoglobin: 13.2 g/dL (ref 13.0–17.0)
Immature Granulocytes: 0 %
Lymphocytes Relative: 33 %
Lymphs Abs: 1.6 10*3/uL (ref 0.7–4.0)
MCH: 27.1 pg (ref 26.0–34.0)
MCHC: 31.3 g/dL (ref 30.0–36.0)
MCV: 86.7 fL (ref 80.0–100.0)
Monocytes Absolute: 0.6 10*3/uL (ref 0.1–1.0)
Monocytes Relative: 13 %
Neutro Abs: 2 10*3/uL (ref 1.7–7.7)
Neutrophils Relative %: 43 %
Platelets: 254 10*3/uL (ref 150–400)
RBC: 4.87 MIL/uL (ref 4.22–5.81)
RDW: 14.9 % (ref 11.5–15.5)
WBC: 4.8 10*3/uL (ref 4.0–10.5)
nRBC: 0 % (ref 0.0–0.2)

## 2022-12-17 LAB — RESP PANEL BY RT-PCR (RSV, FLU A&B, COVID)  RVPGX2
Influenza A by PCR: NEGATIVE
Influenza B by PCR: NEGATIVE
Resp Syncytial Virus by PCR: NEGATIVE
SARS Coronavirus 2 by RT PCR: NEGATIVE

## 2022-12-17 LAB — BASIC METABOLIC PANEL
Anion gap: 9 (ref 5–15)
BUN: 6 mg/dL — ABNORMAL LOW (ref 8–23)
CO2: 27 mmol/L (ref 22–32)
Calcium: 8.7 mg/dL — ABNORMAL LOW (ref 8.9–10.3)
Chloride: 102 mmol/L (ref 98–111)
Creatinine, Ser: 0.75 mg/dL (ref 0.61–1.24)
GFR, Estimated: >60 mL/min (ref >60)
Glucose, Bld: 94 mg/dL (ref 70–99)
Potassium: 3.5 mmol/L (ref 3.5–5.1)
Sodium: 138 mmol/L (ref 135–145)

## 2022-12-17 MED ORDER — ALBUTEROL SULFATE HFA 108 (90 BASE) MCG/ACT IN AERS
1.0000 | INHALATION_SPRAY | Freq: Four times a day (QID) | RESPIRATORY_TRACT | 0 refills | Status: DC | PRN
Start: 2022-12-17 — End: 2023-07-23

## 2022-12-17 MED ORDER — IPRATROPIUM-ALBUTEROL 0.5-2.5 (3) MG/3ML IN SOLN
3.0000 mL | Freq: Once | RESPIRATORY_TRACT | Status: AC
  Administered 2022-12-17: 3 mL via RESPIRATORY_TRACT
  Filled 2022-12-17: qty 3

## 2022-12-17 MED ORDER — METHYLPREDNISOLONE SODIUM SUCC 125 MG IJ SOLR
125.0000 mg | Freq: Once | INTRAMUSCULAR | Status: AC
  Administered 2022-12-17: 125 mg via INTRAVENOUS
  Filled 2022-12-17: qty 2

## 2022-12-17 MED ORDER — PREDNISONE 10 MG PO TABS: 40.0000 mg | ORAL_TABLET | Freq: Every day | ORAL | 0 refills | Status: AC

## 2022-12-17 MED ORDER — PREDNISONE 10 MG PO TABS
40.0000 mg | ORAL_TABLET | Freq: Every day | ORAL | 0 refills | Status: DC
Start: 2022-12-17 — End: 2022-12-17

## 2022-12-17 NOTE — Discharge Instructions (Addendum)
Use your albuterol inhaler every 4 hours.  You can take prednisone, 40 mg each day for the next 5 days.  Please follow-up with your primary care doctor.  Return to the emergency department if develop new or worsening shortness of breath.

## 2022-12-17 NOTE — ED Triage Notes (Signed)
Patient bib [facility] from a hotel with increasing shortness of breath. EMS reports originally he was very diminished and wheezing in all lung fields and oxygen saturation was 87%. EMS reported that patient used his nebulizer all night and it has not helped. On arrival to the ED he he is talking in full sentences and wheezing has decreased. EMS gave the patient 2 duonebs, 2 grams of magnesium and had the patient on CPAP enroute to the ED.    PMH: COPD, Sarcoidosis

## 2022-12-17 NOTE — ED Provider Notes (Signed)
[place] [facility]PARTMENT AT [facility] Provider Note  CSN: [id number] Arrival date & time: 12/17/22 1148  Chief Complaint(s) Shortness of Breath  HPI Glenn [name] is a 62 y.o. male here today for shortness of breath.  Patient is reportedly staying in a hotel because his house is being treated for mold.  He began to feel short of breath, had been using his DuoNebs overnight without improvement.  EMS arrived, provide the patient with DuoNebs and magnesium, placed him on CPAP.  Patient has a history of COPD, not on home O2, and sarcoidosis.  Past Medical History Past Medical History:  Diagnosis Date   Anxiety    Arthritis    Asthma    Chronic pain 05/06/2020   COPD (chronic obstructive pulmonary disease) (HCC)    CVA (cerebral vascular accident) (HCC)    2010; 06/2020   Depression    Diabetes mellitus type 2 in nonobese [facility])    Dyspnea    ED (erectile dysfunction)    Hyperlipidemia    Hypertension    Sarcoid    Tobacco dependence 05/06/2020   Patient Active Problem List   Diagnosis Date Noted   Viral pneumonia 01/11/2022   Influenza A 01/10/2022   CAP (community acquired pneumonia) 01/10/2022   BPH (benign prostatic hyperplasia) 01/10/2022   Sore throat 11/22/2021   Acute respiratory failure with hypoxia (HCC) 10/01/2021   History of CVA (cerebrovascular accident) 09/30/2021   Hyperlipidemia 09/30/2021   Personal history of sarcoidosis 09/30/2021   Asthma exacerbation 05/23/2021   Acute CVA (cerebrovascular accident) (HCC) 06/23/2020   Daytime sleepiness 06/09/2020   Malnutrition of moderate degree 05/07/2020   Chronic pain 05/06/2020   Tobacco dependence 05/06/2020   Hypertension    Diabetes mellitus type 2 in nonobese [facility])    COPD with acute exacerbation (HCC) 06/30/2019   Near syncope 06/30/2019   Pleural nodule 06/30/2019   Colon cancer screening 12/18/2017   History of colonic polyps 12/18/2017   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 Authorizing Provider  albuterol (VENTOLIN HFA) 108 (90 Base) MCG/ACT inhaler Inhale 1-2 puffs into the lungs every 6 (six) hours as needed for wheezing or shortness of breath. 12/17/22 01/16/23 Yes Anders Simmonds T, DO  albuterol (PROVENTIL) (2.5 MG/3ML) 0.083% nebulizer solution Take 3 mLs (2.5 mg total) by nebulization every 4 (four) hours as needed for wheezing or shortness of breath. 11/19/21   Ghimire, Werner Lean, MD  AMITIZA 24 MCG capsule Take 24 mcg by mouth daily as needed for constipation. 03/31/20   [provider]  amLODipine (NORVASC) 10 MG tablet Take 1 tablet (10 mg total) by mouth daily. 10/03/21   Rai, Delene Ruffini, MD  Ascorbic Acid (VITAMIN C PO) Take 2 tablets by mouth daily.    [provider]  atorvastatin (LIPITOR) 40 MG tablet Take 1 tablet (40 mg total) by mouth daily. 06/26/20   Pokhrel, Rebekah Chesterfield, MD  budesonide-formoterol (SYMBICORT) 160-4.5 MCG/ACT inhaler Inhale 2 puffs into the lungs 2 (two) times daily. 07/23/21   [name], MD  clopidogrel (PLAVIX) 75 MG tablet Take 75 mg by mouth daily. 07/06/21   [provider]  Dextromethorphan HBr 15 MG TABS 30 mg orally every 6 to 8 hours; MAX, 120 mg/day 01/15/22   Arthor Captain, PA-C  famotidine (PEPCID) 20 MG tablet One after supper  Patient taking differently: Take 20 mg by mouth daily. 11/22/21   [name], MD  fluticasone (FLONASE) 50 MCG/ACT nasal spray Place 2 sprays into both nostrils daily. 11/19/21   Ghimire, Werner Lean, MD  gabapentin (NEURONTIN) 300 MG capsule Take 1 capsule (300 mg total) by mouth 3 (three) times daily. Patient taking differently: Take 600 mg by mouth daily. 02/09/18   Eustace Moore, MD  guaiFENesin (MUCINEX) 600 MG 12 hr tablet Take 2 tablets (1,200 mg total) by mouth 2 (two) times daily. 10/13/22   Small, Brooke L, PA   ipratropium-albuterol (DUONEB) 0.5-2.5 (3) MG/3ML SOLN Take 3 mLs by nebulization every 6 (six) hours as needed. 10/13/22   Small, Brooke L, PA  levofloxacin (LEVAQUIN) 750 MG tablet Take 1 tablet (750 mg total) by mouth daily. 10/13/22   Small, Brooke L, PA  loratadine (CLARITIN) 10 MG tablet Take 1 tablet (10 mg total) by mouth daily. 01/03/22     losartan-hydrochlorothiazide (HYZAAR) 100-12.5 MG tablet Take 1 tablet by mouth daily. 09/03/21   [provider]  metFORMIN (GLUCOPHAGE) 500 MG tablet Take 1 tablet (500 mg total) by mouth 2 (two) times daily with a meal. 10/03/21   Rai, Ripudeep K, MD  naloxone [facility]) nasal spray 4 mg/0.1 mL SMARTSIG:Both Nares 08/20/21   [provider]  nitroGLYCERIN (NITROSTAT) 0.4 MG SL tablet Place 0.4 mg under the tongue every 5 (five) minutes as needed for chest pain.    [provider]  Oxycodone HCl 20 MG TABS Take 20 mg by mouth 4 (four) times daily. 03/31/20   [provider]  pantoprazole (PROTONIX) 40 MG tablet Take 1 tablet (40 mg total) by mouth daily. 07/02/19   Elgergawy, Leana Roe, MD  predniSONE (DELTASONE) 10 MG tablet Take 4 tablets (40 mg total) by mouth daily for 5 days. 12/17/22 12/22/22  Anders Simmonds T, DO  promethazine-dextromethorphan (PROMETHAZINE-DM) 6.25-15 MG/5ML syrup Take 15 mLs by mouth every 12 (twelve) hours.    [provider]  sildenafil (VIAGRA) 100 MG tablet Take 1 tablet (100 mg total) by mouth daily as needed. 03/02/22     tamsulosin (FLOMAX) 0.4 MG CAPS capsule Take 0.4 mg by mouth daily. 04/26/21   [provider]  Tiotropium Bromide Monohydrate 2.5 MCG/ACT AERS Inhale 2 each into the lungs daily.    [provider]  Tiotropium Bromide-Olodaterol (STIOLTO RESPIMAT) 2.5-2.5 MCG/ACT AERS Inhale 2 puffs into the lungs daily. 01/18/22   [name], MD  Vitamin D, Ergocalciferol, (DRISDOL) 1.25 MG (50000 UNIT) CAPS capsule Take 50,000 Units by mouth once a week. Mondays  05/02/19   [provider]  XTAMPZA ER 9 MG C12A Take 9 mg by mouth 2 (two) times daily. 04/09/21   [provider]  Past Surgical History Past Surgical History:  Procedure Laterality Date   LUNG SURGERY     removed tissue   Family History Family History  Problem Relation Age of Onset   Hypertension Mother    Diabetes Maternal Grandmother    Colon cancer Neg Hx    Esophageal cancer Neg Hx    Rectal cancer Neg Hx    Inflammatory bowel disease Neg Hx    Liver disease Neg Hx    Pancreatic cancer Neg Hx     Social History Social History   Tobacco Use   Smoking status: Former    Current packs/day: 0.00    Average packs/day: 1 pack/day for 34.0 years (34.0 ttl pk-yrs)    Types: Cigarettes    Start date: 05/12/1986    Quit date: 05/11/2020    Years since quitting: 2.6    Passive exposure: Never   Smokeless tobacco: Never   Tobacco comments:    at the most smoked 6-7 cigarettes/day  Vaping Use   Vaping status: Never Used  Substance Use Topics   Alcohol use: Never   Drug use: Never   Allergies Insulins, Ibuprofen, Pork-derived products, Shrimp [shellfish allergy], Tylenol [acetaminophen], and Lisinopril  Review of Systems Review of Systems  Physical Exam Vital Signs  I have reviewed the triage vital signs BP (!) 159/113   Pulse 88   Temp 98.5 F (36.9 C) (Oral)   Resp 20   SpO2 95%   Physical Exam HENT:     Head: Normocephalic.  Cardiovascular:     Rate and Rhythm: Normal rate.  Pulmonary:     Effort: Pulmonary effort is normal.     Breath sounds: Wheezing present.     Comments: Speaking in complete sentences Musculoskeletal:     Right lower leg: No edema.     Left lower leg: No edema.  Skin:    General: Skin is warm and dry.  Neurological:     General: No focal deficit present.     Mental Status: He is alert.      ED Results and Treatments Labs (all labs ordered are listed, but only abnormal results are displayed) Labs Reviewed  BASIC METABOLIC PANEL - Abnormal; Notable for the following components:      Result Value   BUN 6 (*)    Calcium 8.7 (*)    All other components within normal limits  RESP PANEL BY RT-PCR (RSV, FLU A&B, COVID)  RVPGX2  CBC WITH DIFFERENTIAL/PLATELET                                                                                                                          Radiology DG Chest Port 1 View  Result Date: 12/17/2022 CLINICAL DATA:  Dyspnea with increasing shortness of breath. EXAM: PORTABLE CHEST 1 VIEW COMPARISON:  10/13/2022 FINDINGS: Stable cardiomediastinal contours. Postoperative changes are identified within both upper lung zones and left side of the mediastinum. The lung volumes are low with asymmetric elevation  of the left hemidiaphragm. Unchanged from previous exam. Bilateral areas of chronic interstitial coarsening and parenchymal scarring identified which appears similar to the previous exam. No airspace consolidation identified. No acute osseous findings. IMPRESSION: 1. No acute cardiopulmonary abnormalities. 2. Low lung volumes with asymmetric elevation of the left hemidiaphragm. 3. Postoperative changes within both lungs with chronic interstitial coarsening, parenchymal scarring and architectural distortion. Electronically Signed   By: Signa Kell M.D.   On: 12/17/2022 12:18    Pertinent labs & imaging results that were available during my care of the patient were reviewed by me and considered in my medical decision making (see MDM for details).  Medications Ordered in ED Medications  methylPREDNISolone sodium succinate (SOLU-MEDROL) 125 mg/2 mL injection 125 mg (125 mg Intravenous Given 12/17/22 1245)  ipratropium-albuterol (DUONEB) 0.5-2.5 (3) MG/3ML nebulizer solution 3 mL (3 mLs Nebulization Given 12/17/22 1239)                                                                                                                                      Procedures Procedures  (including critical care time)  Medical Decision Making / ED Course   This patient presents to the ED for concern of shortness of breath, this involves an extensive number of treatment options, and is a complaint that carries with it a high risk of complications and morbidity.  The differential diagnosis includes COPD exacerbation, viral syndrome, pneumonia, less likely pulmonary edema, less likely PE.  MDM: Patient improved significantly from his first encounter with EMS, through transport to the emergency department.  Was able to take patient off of CPAP.  Currently have him on 2 L, providing Solu-Medrol.  Anticipate being able to wean the patient off of 2 L.  Chest x-ray, viral panel, basic labs ordered.  Reassessment 2:05 PM-patient's work of breathing significantly improved.  He is saturating 95% on room air with she is at his baseline.  Talking in complete sentences, no dyspnea with ambulation.  Will discharge home with prednisone, albuterol.   Additional history obtained: -Additional history obtained from EMS -External records from outside source obtained and reviewed including: Chart review including previous notes, labs, imaging, consultation notes   Lab Tests: -I ordered, reviewed, and interpreted labs.   The pertinent results include:   Labs Reviewed  BASIC METABOLIC PANEL - Abnormal; Notable for the following components:      Result Value   BUN 6 (*)    Calcium 8.7 (*)    All other components within normal limits  RESP PANEL BY RT-PCR (RSV, FLU A&B, COVID)  RVPGX2  CBC WITH DIFFERENTIAL/PLATELET      EKG my independent review the patient's EKG shows no ST segment depressions or elevations, no T wave versions, no evidence of acute ischemia.  EKG Interpretation Date/Time:  Saturday December 17 2022 11:50:18 EST Ventricular Rate:  96 PR  Interval:  135 QRS Duration:  86 QT Interval:  357 QTC Calculation: 452 R Axis:   39  Text Interpretation: Sinus rhythm Left atrial enlargement Abnormal R-wave progression, early transition Confirmed by Anders Simmonds [phone]) on 12/17/2022 11:56:03 AM         Imaging Studies ordered: I ordered imaging studies including chest x-ray I independently visualized and interpreted imaging. I agree with the radiologist interpretation   Medicines ordered and prescription drug management: Meds ordered this encounter  Medications   methylPREDNISolone sodium succinate (SOLU-MEDROL) 125 mg/2 mL injection 125 mg    IV methylprednisolone will be converted to either a q12h or q24h frequency with the same total daily dose (TDD).  Ordered Dose: 1 to 125 mg TDD; convert to: TDD q24h.  Ordered Dose: 126 to 250 mg TDD; convert to: TDD div q12h.  Ordered Dose: >250 mg TDD; DAW.   ipratropium-albuterol (DUONEB) 0.5-2.5 (3) MG/3ML nebulizer solution 3 mL   albuterol (VENTOLIN HFA) 108 (90 Base) MCG/ACT inhaler    Sig: Inhale 1-2 puffs into the lungs every 6 (six) hours as needed for wheezing or shortness of breath.    Dispense:  1 each    Refill:  0   DISCONTD: predniSONE (DELTASONE) 10 MG tablet    Sig: Take 4 tablets (40 mg total) by mouth daily for 5 days.    Dispense:  15 tablet    Refill:  0   predniSONE (DELTASONE) 10 MG tablet    Sig: Take 4 tablets (40 mg total) by mouth daily for 5 days.    Dispense:  20 tablet    Refill:  0    -I have reviewed the patients home medicines and have made adjustments as needed     Cardiac Monitoring: The patient was maintained on a cardiac monitor.  I personally viewed and interpreted the cardiac monitored which showed an underlying rhythm of: Normal sinus rhythm  Social Determinants of Health:  Factors impacting patients care include: Lack of access to primary care   Reevaluation: After the interventions noted above, I reevaluated the patient and  found that they have :improved  Co morbidities that complicate the patient evaluation  Past Medical History:  Diagnosis Date   Anxiety    Arthritis    Asthma    Chronic pain 05/06/2020   COPD (chronic obstructive pulmonary disease) (HCC)    CVA (cerebral vascular accident) (HCC)    2010; 06/2020   Depression    Diabetes mellitus type 2 in nonobese (HCC)    Dyspnea    ED (erectile dysfunction)    Hyperlipidemia    Hypertension    Sarcoid    Tobacco dependence 05/06/2020      Dispostion: I considered admission for this patient, however with his improvement, believe he is currently safe for discharge home with medications.     Final Clinical Impression(s) / ED Diagnoses Final diagnoses:  SOB (shortness of breath)  COPD exacerbation (HCC)     @PCDICTATION @    Anders Simmonds T, DO 12/17/22 1407

## 2022-12-17 NOTE — Progress Notes (Signed)
RT note. Patient placed on 2 L [place] per MD, pat sat 100% with no labored breathing noted. RT will continue to monitor.   12/17/22 1201  Therapy Vitals  Pulse Rate 96  Resp (!) 25  MEWS Score/Color  MEWS Score 1  MEWS Score Color Green  Respiratory Assessment  Assessment Type Assess only  Respiratory Pattern Regular;Unlabored  Chest Assessment Chest expansion symmetrical  Oxygen Therapy/Pulse Ox  O2 Device Nasal Cannula  O2 Therapy Oxygen  O2 Flow Rate (L/min) 2 L/min  SpO2 100 %

## 2022-12-29 ENCOUNTER — Telehealth: Payer: Self-pay | Admitting: Internal Medicine

## 2022-12-29 MED ORDER — ALBUTEROL SULFATE HFA 108 (90 BASE) MCG/ACT IN AERS: 2.0000 | INHALATION_SPRAY | Freq: Four times a day (QID) | RESPIRATORY_TRACT | 3 refills | Status: DC | PRN

## 2022-12-29 MED ORDER — ALBUTEROL SULFATE (2.5 MG/3ML) 0.083% IN NEBU: 2.5000 mg | INHALATION_SOLUTION | Freq: Four times a day (QID) | RESPIRATORY_TRACT | 11 refills | Status: DC | PRN

## 2022-12-29 MED ORDER — BUDESONIDE-FORMOTEROL FUMARATE 160-4.5 MCG/ACT IN AERO: 2.0000 | INHALATION_SPRAY | Freq: Two times a day (BID) | RESPIRATORY_TRACT | 11 refills | Status: DC

## 2022-12-29 NOTE — Telephone Encounter (Signed)
PT needs more Albuterol Solution sent to the [facility] please.He is out.   [phone] is his #

## 2022-12-29 NOTE — Telephone Encounter (Signed)
Called and spoke with patient, verified that patient needed a refill of his albuterol nebulizer solution, albuterol inhaler and Symbicort inhaler sent to the [facility] pharmacy in [place].  Advised I would send in the refills.  He verbalized understanding.  Refills sent.  Nothing further needed.

## 2023-01-18 ENCOUNTER — Other Ambulatory Visit (HOSPITAL_COMMUNITY): Payer: Self-pay

## 2023-01-20 ENCOUNTER — Other Ambulatory Visit (HOSPITAL_COMMUNITY): Payer: Self-pay

## 2023-01-20 MED ORDER — SILDENAFIL CITRATE 100 MG PO TABS
100.0000 mg | ORAL_TABLET | Freq: Every day | ORAL | 5 refills | Status: DC | PRN
  Filled 2023-01-20: qty 30, 30d supply, fill #0
  Filled 2023-03-02: qty 30, 30d supply, fill #1
  Filled 2023-04-05: qty 30, 30d supply, fill #2
  Filled 2023-05-16: qty 30, 30d supply, fill #3
  Filled 2023-06-21 (×2): qty 30, 30d supply, fill #4
  Filled 2023-09-06: qty 30, 30d supply, fill #5

## 2023-01-26 ENCOUNTER — Encounter (HOSPITAL_BASED_OUTPATIENT_CLINIC_OR_DEPARTMENT_OTHER): Payer: Self-pay

## 2023-01-26 ENCOUNTER — Other Ambulatory Visit: Payer: Self-pay

## 2023-01-26 ENCOUNTER — Emergency Department (HOSPITAL_BASED_OUTPATIENT_CLINIC_OR_DEPARTMENT_OTHER): Payer: No Typology Code available for payment source | Admitting: Radiology

## 2023-01-26 ENCOUNTER — Emergency Department (HOSPITAL_BASED_OUTPATIENT_CLINIC_OR_DEPARTMENT_OTHER)
Admission: EM | Admit: 2023-01-26 | Discharge: 2023-01-26 | Disposition: A | Discharge status: Home / Self Care | Payer: No Typology Code available for payment source | Attending: Emergency Medicine | Admitting: Emergency Medicine

## 2023-01-26 DIAGNOSIS — Z79899 Other long term (current) drug therapy: Secondary | ICD-10-CM | POA: Insufficient documentation

## 2023-01-26 DIAGNOSIS — Z1152 Encounter for screening for COVID-19: Secondary | ICD-10-CM | POA: Insufficient documentation

## 2023-01-26 DIAGNOSIS — Z7951 Long term (current) use of inhaled steroids: Secondary | ICD-10-CM | POA: Diagnosis not present

## 2023-01-26 DIAGNOSIS — J45909 Unspecified asthma, uncomplicated: Secondary | ICD-10-CM | POA: Insufficient documentation

## 2023-01-26 DIAGNOSIS — J441 Chronic obstructive pulmonary disease with (acute) exacerbation: Secondary | ICD-10-CM | POA: Insufficient documentation

## 2023-01-26 DIAGNOSIS — R0602 Shortness of breath: Secondary | ICD-10-CM | POA: Diagnosis present

## 2023-01-26 LAB — CBC WITH DIFFERENTIAL/PLATELET
Abs Immature Granulocytes: 0.01 10*3/uL (ref 0.00–0.07)
Basophils Absolute: 0 10*3/uL (ref 0.0–0.1)
Basophils Relative: 0 %
Eosinophils Absolute: 0.4 10*3/uL (ref 0.0–0.5)
Eosinophils Relative: 6 %
HCT: 45.2 % (ref 39.0–52.0)
Hemoglobin: 14.1 g/dL (ref 13.0–17.0)
Immature Granulocytes: 0 %
Lymphocytes Relative: 49 %
Lymphs Abs: 3.6 10*3/uL (ref 0.7–4.0)
MCH: 27.3 pg (ref 26.0–34.0)
MCHC: 31.2 g/dL (ref 30.0–36.0)
MCV: 87.6 fL (ref 80.0–100.0)
Monocytes Absolute: 0.7 10*3/uL (ref 0.1–1.0)
Monocytes Relative: 10 %
Neutro Abs: 2.5 10*3/uL (ref 1.7–7.7)
Neutrophils Relative %: 35 %
Platelets: 270 10*3/uL (ref 150–400)
RBC: 5.16 MIL/uL (ref 4.22–5.81)
RDW: 14.6 % (ref 11.5–15.5)
WBC: 7.3 10*3/uL (ref 4.0–10.5)
nRBC: 0 % (ref 0.0–0.2)

## 2023-01-26 LAB — BASIC METABOLIC PANEL
Anion gap: 6 (ref 5–15)
BUN: 11 mg/dL (ref 8–23)
CO2: 33 mmol/L — ABNORMAL HIGH (ref 22–32)
Calcium: 9.2 mg/dL (ref 8.9–10.3)
Chloride: 100 mmol/L (ref 98–111)
Creatinine, Ser: 0.84 mg/dL (ref 0.61–1.24)
GFR, Estimated: >60 mL/min (ref >60)
Glucose, Bld: 116 mg/dL — ABNORMAL HIGH (ref 70–99)
Potassium: 3 mmol/L — ABNORMAL LOW (ref 3.5–5.1)
Sodium: 139 mmol/L (ref 135–145)

## 2023-01-26 LAB — RESP PANEL BY RT-PCR (RSV, FLU A&B, COVID)  RVPGX2
Influenza A by PCR: NEGATIVE
Influenza B by PCR: NEGATIVE
Resp Syncytial Virus by PCR: NEGATIVE
SARS Coronavirus 2 by RT PCR: NEGATIVE

## 2023-01-26 MED ORDER — ALBUTEROL SULFATE (2.5 MG/3ML) 0.083% IN NEBU
2.5000 mg | INHALATION_SOLUTION | Freq: Once | RESPIRATORY_TRACT | Status: AC
  Administered 2023-01-26: 2.5 mg via RESPIRATORY_TRACT
  Filled 2023-01-26: qty 3

## 2023-01-26 MED ORDER — METHYLPREDNISOLONE SODIUM SUCC 125 MG IJ SOLR
125.0000 mg | Freq: Once | INTRAMUSCULAR | Status: AC
  Administered 2023-01-26: 125 mg via INTRAVENOUS
  Filled 2023-01-26: qty 2

## 2023-01-26 MED ORDER — IPRATROPIUM-ALBUTEROL 0.5-2.5 (3) MG/3ML IN SOLN
RESPIRATORY_TRACT | Status: AC
  Administered 2023-01-26: 3 mL
  Filled 2023-01-26: qty 3

## 2023-01-26 MED ORDER — ALBUTEROL (5 MG/ML) CONTINUOUS INHALATION SOLN
10.0000 mg/h | INHALATION_SOLUTION | Freq: Once | RESPIRATORY_TRACT | Status: AC
  Administered 2023-01-26: 10 mg/h via RESPIRATORY_TRACT
  Filled 2023-01-26: qty 20

## 2023-01-26 MED ORDER — ALBUTEROL SULFATE HFA 108 (90 BASE) MCG/ACT IN AERS
2.0000 | INHALATION_SPRAY | RESPIRATORY_TRACT | Status: DC | PRN
  Administered 2023-01-26: 2 via RESPIRATORY_TRACT
  Filled 2023-01-26: qty 6.7

## 2023-01-26 MED ORDER — PREDNISONE 50 MG PO TABS: ORAL_TABLET | ORAL | 0 refills | Status: DC

## 2023-01-26 MED ORDER — ALBUTEROL SULFATE (2.5 MG/3ML) 0.083% IN NEBU: 2.5000 mg | INHALATION_SOLUTION | RESPIRATORY_TRACT | 2 refills | Status: DC | PRN

## 2023-01-26 MED ORDER — IPRATROPIUM-ALBUTEROL 0.5-2.5 (3) MG/3ML IN SOLN: 3.0000 mL | Freq: Once | RESPIRATORY_TRACT | Status: DC

## 2023-01-26 NOTE — ED Notes (Signed)
RT Note: Patient Stated the continuous nebulizer treatment helped him breath easier but just feels jittery. Noted that he has less wheezing throughout and doesn't seem as tight chested.

## 2023-01-26 NOTE — ED Triage Notes (Addendum)
Pt c/o SHOB, dry cough x1wk. Hx COPD, asthma, states nebulizer & rescue inhaler "aren't working."   Q4h breathing treatments at home, "it's not working."   Denies NVD, additional sick symptoms, sick contact

## 2023-01-26 NOTE — ED Notes (Signed)
Reviewed AVS/discharge instruction with patient. Time allotted for and all questions answered. Patient is agreeable for d/c and escorted to ed exit by staff.  

## 2023-01-26 NOTE — ED Notes (Addendum)
Spo2 87% while patient resting/sleeping. RT to bedside place on 2 L O2 via [place].

## 2023-01-26 NOTE — ED Notes (Signed)
Patient states he is having SOB and tight chest feeling. He is ordered a breathing treatment.

## 2023-01-26 NOTE — ED Provider Notes (Signed)
[facility] AT [facility] Provider Note   CSN: [id number] Arrival date & time: 01/26/23  6010     History  Chief Complaint  Patient presents with   Shortness of Breath   Cough    Glenn [name] is a 62 y.o. male.  Patient complains of shortness of breath today.  Patient reports he has been using his albuterol nebulizer without relief.  Patient reports he has a past medical history of asthma and COPD.  Patient reports that he becomes short of breath with walking.  Patient denies any fever or chills.  Patient reports he has had a cough and congestion.  He has not been around anyone who has been sick.  Patient denies any chest pain he has not had any abdominal pain he denies any nausea vomiting or diarrhea.  He reports he does not smoke.  He does use an albuterol inhaler  The history is provided by the patient. No language interpreter was used.  Shortness of Breath Associated symptoms: cough   Cough Associated symptoms: shortness of breath        Home Medications Prior to Admission medications   Medication Sig Start Date End Date Taking? Authorizing Provider  albuterol (PROVENTIL) (2.5 MG/3ML) 0.083% nebulizer solution Take 3 mLs (2.5 mg total) by nebulization every 4 (four) hours as needed for wheezing or shortness of breath. 01/26/23 01/26/24 Yes Elson Areas, PA-C  predniSONE (DELTASONE) 50 MG tablet One tablet a day beginning on 12/20 01/26/23  Yes Jessic Standifer, Lonia Skinner, PA-C  albuterol (VENTOLIN HFA) 108 (90 Base) MCG/ACT inhaler Inhale 1-2 puffs into the lungs every 6 (six) hours as needed for wheezing or shortness of breath. 12/17/22 01/16/23  Anders Simmonds T, DO  AMITIZA 24 MCG capsule Take 24 mcg by mouth daily as needed for constipation. 03/31/20   [provider]  amLODipine (NORVASC) 10 MG tablet Take 1 tablet (10 mg total) by mouth daily. 10/03/21   Rai, Delene Ruffini, MD  Ascorbic Acid (VITAMIN C PO) Take 2 tablets by mouth daily.     [provider]  atorvastatin (LIPITOR) 40 MG tablet Take 1 tablet (40 mg total) by mouth daily. 06/26/20   Pokhrel, Rebekah Chesterfield, MD  budesonide-formoterol (SYMBICORT) 160-4.5 MCG/ACT inhaler Inhale 2 puffs into the lungs 2 (two) times daily. 12/29/22   [name], MD  clopidogrel (PLAVIX) 75 MG tablet Take 75 mg by mouth daily. 07/06/21   [provider]  Dextromethorphan HBr 15 MG TABS 30 mg orally every 6 to 8 hours; MAX, 120 mg/day 01/15/22   Arthor Captain, PA-C  famotidine (PEPCID) 20 MG tablet One after supper Patient taking differently: Take 20 mg by mouth daily. 11/22/21   [name], MD  fluticasone (FLONASE) 50 MCG/ACT nasal spray Place 2 sprays into both nostrils daily. 11/19/21   Ghimire, Werner Lean, MD  gabapentin (NEURONTIN) 300 MG capsule Take 1 capsule (300 mg total) by mouth 3 (three) times daily. Patient taking differently: Take 600 mg by mouth daily. 02/09/18   Eustace Moore, MD  guaiFENesin (MUCINEX) 600 MG 12 hr tablet Take 2 tablets (1,200 mg total) by mouth 2 (two) times daily. 10/13/22   Small, Brooke L, PA  ipratropium-albuterol (DUONEB) 0.5-2.5 (3) MG/3ML SOLN Take 3 mLs by nebulization every 6 (six) hours as needed. 10/13/22   Small, Brooke L, PA  levofloxacin (LEVAQUIN) 750 MG tablet Take 1 tablet (750 mg total) by mouth daily. 10/13/22   Small, Harley Alto, PA  loratadine (CLARITIN) 10 MG tablet Take 1 tablet (10 mg total) by mouth daily. 01/03/22     losartan-hydrochlorothiazide (HYZAAR) 100-12.5 MG tablet Take 1 tablet by mouth daily. 09/03/21   [provider]  metFORMIN (GLUCOPHAGE) 500 MG tablet Take 1 tablet (500 mg total) by mouth 2 (two) times daily with a meal. 10/03/21   Rai, Ripudeep K, MD  naloxone [facility]) nasal spray 4 mg/0.1 mL SMARTSIG:Both Nares 08/20/21   [provider]  nitroGLYCERIN (NITROSTAT) 0.4 MG SL tablet Place 0.4 mg under the tongue every 5 (five) minutes as needed for chest pain.    [provider]   Oxycodone HCl 20 MG TABS Take 20 mg by mouth 4 (four) times daily. 03/31/20   [provider]  pantoprazole (PROTONIX) 40 MG tablet Take 1 tablet (40 mg total) by mouth daily. 07/02/19   Elgergawy, Leana Roe, MD  promethazine-dextromethorphan (PROMETHAZINE-DM) 6.25-15 MG/5ML syrup Take 15 mLs by mouth every 12 (twelve) hours.    [provider]  sildenafil (VIAGRA) 100 MG tablet Take 1 tablet (100 mg total) by mouth daily as needed. 01/20/23     tamsulosin (FLOMAX) 0.4 MG CAPS capsule Take 0.4 mg by mouth daily. 04/26/21   [provider]  Tiotropium Bromide Monohydrate 2.5 MCG/ACT AERS Inhale 2 each into the lungs daily.    [provider]  Tiotropium Bromide-Olodaterol (STIOLTO RESPIMAT) 2.5-2.5 MCG/ACT AERS Inhale 2 puffs into the lungs daily. 01/18/22   [name], MD  Vitamin D, Ergocalciferol, (DRISDOL) 1.25 MG (50000 UNIT) CAPS capsule Take 50,000 Units by mouth once a week. Mondays 05/02/19   [provider]  XTAMPZA ER 9 MG C12A Take 9 mg by mouth 2 (two) times daily. 04/09/21   [provider]      Allergies    Insulins, Ibuprofen, Pork-derived products, Shrimp [shellfish allergy], Tylenol [acetaminophen], and Lisinopril    Review of Systems   Review of Systems  Respiratory:  Positive for cough and shortness of breath.   All other systems reviewed and are negative.   Physical Exam Updated Vital Signs BP (!) 133/94   Pulse (!) 104   Temp 98 F (36.7 C) (Oral)   Resp 20   Wt 65.8 kg   SpO2 96%   BMI 24.13 kg/m  Physical Exam Vitals and nursing note reviewed.  Constitutional:      Appearance: He is well-developed.  HENT:     Head: Normocephalic.  Cardiovascular:     Rate and Rhythm: Normal rate and regular rhythm.  Pulmonary:     Breath sounds: Examination of the right-upper field reveals wheezing. Examination of the left-upper field reveals wheezing. Examination of the right-middle field reveals wheezing.  Examination of the left-middle field reveals wheezing. Examination of the right-lower field reveals wheezing. Examination of the left-lower field reveals wheezing. Wheezing present.  Abdominal:     General: There is no distension.  Musculoskeletal:        General: Normal range of motion.     Cervical back: Normal range of motion.  Skin:    General: Skin is warm.  Neurological:     General: No focal deficit present.     Mental Status: He is alert and oriented to person, place, and time.     ED Results / Procedures / Treatments   Labs (all labs ordered are listed, but only abnormal results are displayed) Labs Reviewed  BASIC METABOLIC PANEL - Abnormal; Notable for the following components:  Result Value   Potassium 3.0 (*)    CO2 33 (*)    Glucose, Bld 116 (*)    All other components within normal limits  RESP PANEL BY RT-PCR (RSV, FLU A&B, COVID)  RVPGX2  CBC WITH DIFFERENTIAL/PLATELET    EKG EKG Interpretation Date/Time:  Thursday January 26 2023 09:17:51 EST Ventricular Rate:  83 PR Interval:  132 QRS Duration:  84 QT Interval:  368 QTC Calculation: 432 R Axis:   39  Text Interpretation: Normal sinus rhythm Nonspecific T wave abnormality Abnormal ECG When compared with ECG of 17-Dec-2022 11:50, PREVIOUS ECG IS PRESENT No significant change since last tracing Confirmed by Jacalyn Lefevre [phone]) on 01/26/2023 10:14:54 AM  Radiology DG Chest 2 View Result Date: 01/26/2023 CLINICAL DATA:  Shortness of breath. EXAM: CHEST - 2 VIEW COMPARISON:  December 17, 2022. FINDINGS: Stable cardiomediastinal silhouette. Stable coarse reticular densities are noted throughout both lungs most consistent with scarring. Elevated left hemidiaphragm is noted. No acute abnormality is noted. Bony thorax is unremarkable. IMPRESSION: Stable chronic bilateral lung opacities as noted above. Electronically Signed   By: Lupita Raider M.D.   On: 01/26/2023 12:05    Procedures Procedures     Medications Ordered in ED Medications  albuterol (VENTOLIN HFA) 108 (90 Base) MCG/ACT inhaler 2 puff (2 puffs Inhalation Provided for home use 01/26/23 0930)  ipratropium-albuterol (DUONEB) 0.5-2.5 (3) MG/3ML nebulizer solution 3 mL (3 mLs Nebulization Not Given 01/26/23 0931)  albuterol (PROVENTIL) (2.5 MG/3ML) 0.083% nebulizer solution 2.5 mg (2.5 mg Nebulization Given 01/26/23 0930)  ipratropium-albuterol (DUONEB) 0.5-2.5 (3) MG/3ML nebulizer solution (3 mLs  Given 01/26/23 0927)  albuterol (PROVENTIL,VENTOLIN) solution continuous neb (10 mg/hr Nebulization Given 01/26/23 1014)  methylPREDNISolone sodium succinate (SOLU-MEDROL) 125 mg/2 mL injection 125 mg (125 mg Intravenous Given 01/26/23 1309)    ED Course/ Medical Decision Making/ A&P                                 Medical Decision Making Patient complains of shortness of breath.  Patient has past medical history of COPD and asthma  Amount and/or Complexity of Data Reviewed Independent Historian: spouse    Details: Patient is here with a family member who is supportive External Data Reviewed: notes.    Details: Primary care notes reviewed Labs: ordered. Decision-making details documented in ED Course.    Details: Labs ordered reviewed and interpreted troponin is negative.  COVID influenza and RSV are negative Radiology: ordered and independent interpretation performed. Decision-making details documented in ED Course.    Details: Chest x-ray shows chronic changes of lung disease ECG/medicine tests: ordered and independent interpretation performed. Decision-making details documented in ED Course.    Details: KG shows no acute changes Discussion of management or test interpretation with external provider(s): Patient given a duo neb.  Patient has continued wheezing.  Patient is started on a 1 hour continuous neb.  Patient reports feeling much better after nebulization.  Patient is given Solu-Medrol 125 mg IV.  I counseled  patient on treatment I will give him prednisone 50 mg a day for the next 5 days.  He is given a refill of his albuterol neb solution.  Patient is advised to follow-up with his primary care physician for recheck  Risk Prescription drug management.           Final Clinical Impression(s) / ED Diagnoses Final diagnoses:  Chronic obstructive pulmonary disease with acute exacerbation (  HCC)    Rx / DC Orders ED Discharge Orders          Ordered    albuterol (PROVENTIL) (2.5 MG/3ML) 0.083% nebulizer solution  Every 4 hours PRN        01/26/23 1314    predniSONE (DELTASONE) 50 MG tablet        01/26/23 1314           An After Visit Summary was printed and given to the patient.    Elson Areas, PA-C 01/26/23 1757    Jacalyn Lefevre, MD 01/27/23 [phone]

## 2023-01-26 NOTE — ED Notes (Signed)
Oxycodone at 0700 today with some relief

## 2023-01-26 NOTE — ED Notes (Signed)
Spo2 probe replace. O2 removed.  Spo2 97% on RA

## 2023-01-26 NOTE — ED Notes (Signed)
Patient continues to have chest tightness post breathing treatment.He was placed on a continuous nebulizer treatment.

## 2023-01-26 NOTE — ED Notes (Signed)
Patient requested an Albuterol inhaler for home use. He needed a refill

## 2023-03-01 NOTE — Progress Notes (Deleted)
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 Lung vol reduction ? Helped breathing at the time but uses [facility] parking and  [facility] = can't walk a nl pace on a flat grade s sob but does fine slow and flat so referred to pulmonary clinic 10/05/2016 by Dr  Tyson Dense      History of Present Illness  10/05/2016 1st [facility] office visit/ Glenn [name]   Chief Complaint  Patient presents with   Pulmonary Consult    Referred by Dr. Alveta Heimlich for eval of Asthma and COPD. Pt states he was dxed with COPD and Asthma in 1995. He moved here a year ago from [place], [place]. He states his breathing is "okay" today. He states he has a "slight cold"- prod cough with minimal brown sputum.    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  Work on [employer] B = Backup Only use your albuterol (proair) as a rescue medication Plan C = Crisis - only use your albuterol nebulizer if you first try Plan B and it fails to help > ok to use the nebulizer up to every 4 hours but if start needing it regularly call for immediate appointment Please schedule a follow up office visit in 6 weeks, call sooner if needed with pfts    Admit 09/29/21 and 11/17/21  and tussionex made  the difference   03/01/2022  f/u ov/Glenn [name] re: GOLD 2   maint on GOLD 2  spiriva/ symbicort 160 Chief Complaint  Patient presents with   Follow-up    Doing well.  Dyspnea:  limited by back not breathing  Cough: none  Sleeping: 30 degree bed SABA use: not all  02: none  Covid status:   never vax/ never infected  Lung cancer screening :  in program  Rec No change in medications Work on inhaler technique: Will stop your oxygen   11/10/2022  f/u ov/ office/Glenn [name] re: GOLD 2 copd  maint on symb/spiriva  Chief Complaint  Patient presents with   Follow-up    Doing well.  Does have some wheezing and cough in mornings.  Needs new nebulizer machine.  Dyspnea:  back limits activity  /  uses scooter most of the time / does walk at grocery  ok if very slowly  Cough:some wheezing/ congestion in am  Sleeping: 30 degress s    resp cc  SABA use: none  02: not using x after exertion. [facility]  Rec Make  sure you check your oxygen saturation  AT  your highest level of activity (not after you stop)   to be sure it stays over 90%  Plan A = Automatic = Always=    spiriva / symbicort  2 puffs each am and repeat just the symbicort in pm  Plan B = Backup (to supplement plan A, not to replace it) Only use your albuterol inhaler as a rescue medication Plan C = Crisis (instead of Plan B but only if Plan B stops working) - only use your albuterol nebulizer if you first try Plan B We can provide you a new nebulizer try thru the [facility]  Please schedule a follow up visit in 3 months but call sooner if needed  with all medications /inhalers/ solutions in hand      03/02/2023  f/u ov/Glenn [name] re: GOLD 2 copd    maint on ***  did *** bring meds  No chief complaint on file.   Dyspnea:  *** Cough: *** Sleeping: *** resp cc  SABA use: *** 02: ***  Lung cancer screening :  ***    No obvious day to day or daytime variability or assoc excess/ purulent sputum or mucus plugs or  hemoptysis or cp or chest tightness, subjective wheeze or overt sinus or hb symptoms.    Also denies any obvious fluctuation of symptoms with weather or environmental changes or other aggravating or alleviating factors except as outlined above   No unusual exposure hx or h/o childhood pna/ asthma or knowledge of premature birth.  Current Allergies, Complete Past Medical History, Past Surgical History, Family History, and Social History were reviewed in [employer] record.  ROS  The following are not active complaints unless bolded Hoarseness, sore throat, dysphagia, dental problems, itching, sneezing,  nasal congestion or discharge of excess mucus or purulent secretions, ear ache,   fever, chills, sweats, unintended wt loss or wt gain, classically pleuritic or exertional cp,  orthopnea pnd or arm/hand swelling  or leg swelling, presyncope, palpitations, abdominal pain, anorexia, nausea, vomiting, diarrhea  or change in bowel habits or change in bladder habits, change in stools or change in urine, dysuria, hematuria,  rash, arthralgias, visual complaints, headache, numbness, weakness or ataxia or problems with walking or coordination,  change in mood or  memory.        No outpatient medications have been marked as taking for the 03/02/23 encounter (Appointment) with [name], MD.              Objective:   Physical Exam   Wts  03/02/2023         ***  11/10/2022        143  03/01/2022        151  01/18/2022      148 11/22/2021      146   08/16/2021        149  11/11/2020        147  08/11/2020          144 01/15/2020        140 03/26/2019        144  11/20/2018      144  08/14/2018          144  02/08/2018          143  11/22/17 142 lb (64.4 kg)  10/05/16 131 lb 12.8 oz (59.8 kg)    Vital signs reviewed  03/02/2023  - Note at rest 02  sats  ***% on ***   General appearance:    ***  Mod barr***        Assessment:

## 2023-03-02 ENCOUNTER — Other Ambulatory Visit: Payer: Self-pay

## 2023-03-02 ENCOUNTER — Other Ambulatory Visit (HOSPITAL_COMMUNITY): Payer: Self-pay

## 2023-03-02 ENCOUNTER — Ambulatory Visit: Payer: No Typology Code available for payment source | Admitting: Internal Medicine

## 2023-03-25 ENCOUNTER — Encounter (HOSPITAL_BASED_OUTPATIENT_CLINIC_OR_DEPARTMENT_OTHER): Payer: Self-pay

## 2023-03-25 ENCOUNTER — Emergency Department (HOSPITAL_BASED_OUTPATIENT_CLINIC_OR_DEPARTMENT_OTHER): Payer: No Typology Code available for payment source

## 2023-03-25 ENCOUNTER — Emergency Department (HOSPITAL_BASED_OUTPATIENT_CLINIC_OR_DEPARTMENT_OTHER)
Admission: EM | Admit: 2023-03-25 | Discharge: 2023-03-25 | Disposition: A | Discharge status: Home / Self Care | Payer: No Typology Code available for payment source | Attending: Emergency Medicine | Admitting: Emergency Medicine

## 2023-03-25 DIAGNOSIS — Z7951 Long term (current) use of inhaled steroids: Secondary | ICD-10-CM | POA: Diagnosis not present

## 2023-03-25 DIAGNOSIS — J441 Chronic obstructive pulmonary disease with (acute) exacerbation: Secondary | ICD-10-CM | POA: Diagnosis not present

## 2023-03-25 DIAGNOSIS — Z7952 Long term (current) use of systemic steroids: Secondary | ICD-10-CM | POA: Insufficient documentation

## 2023-03-25 DIAGNOSIS — Z7901 Long term (current) use of anticoagulants: Secondary | ICD-10-CM | POA: Diagnosis not present

## 2023-03-25 DIAGNOSIS — R059 Cough, unspecified: Secondary | ICD-10-CM | POA: Diagnosis present

## 2023-03-25 LAB — COMPREHENSIVE METABOLIC PANEL
ALT: 15 U/L (ref 0–44)
AST: 16 U/L (ref 15–41)
Albumin: 4.3 g/dL (ref 3.5–5.0)
Alkaline Phosphatase: 68 U/L (ref 38–126)
Anion gap: 5 (ref 5–15)
BUN: 16 mg/dL (ref 8–23)
CO2: 33 mmol/L — ABNORMAL HIGH (ref 22–32)
Calcium: 8.8 mg/dL — ABNORMAL LOW (ref 8.9–10.3)
Chloride: 100 mmol/L (ref 98–111)
Creatinine, Ser: 0.87 mg/dL (ref 0.61–1.24)
GFR, Estimated: >60 mL/min (ref >60)
Glucose, Bld: 167 mg/dL — ABNORMAL HIGH (ref 70–99)
Potassium: 3.7 mmol/L (ref 3.5–5.1)
Sodium: 138 mmol/L (ref 135–145)
Total Bilirubin: 0.3 mg/dL (ref 0.0–1.2)
Total Protein: 7.2 g/dL (ref 6.5–8.1)

## 2023-03-25 LAB — CBC WITH DIFFERENTIAL/PLATELET
Abs Immature Granulocytes: 0.01 10*3/uL (ref 0.00–0.07)
Basophils Absolute: 0.1 10*3/uL (ref 0.0–0.1)
Basophils Relative: 1 %
Eosinophils Absolute: 0.6 10*3/uL — ABNORMAL HIGH (ref 0.0–0.5)
Eosinophils Relative: 8 %
HCT: 44 % (ref 39.0–52.0)
Hemoglobin: 14 g/dL (ref 13.0–17.0)
Immature Granulocytes: 0 %
Lymphocytes Relative: 38 %
Lymphs Abs: 3 10*3/uL (ref 0.7–4.0)
MCH: 28.1 pg (ref 26.0–34.0)
MCHC: 31.8 g/dL (ref 30.0–36.0)
MCV: 88.2 fL (ref 80.0–100.0)
Monocytes Absolute: 0.9 10*3/uL (ref 0.1–1.0)
Monocytes Relative: 12 %
Neutro Abs: 3.3 10*3/uL (ref 1.7–7.7)
Neutrophils Relative %: 41 %
Platelets: 267 10*3/uL (ref 150–400)
RBC: 4.99 MIL/uL (ref 4.22–5.81)
RDW: 14.1 % (ref 11.5–15.5)
WBC: 7.9 10*3/uL (ref 4.0–10.5)
nRBC: 0 % (ref 0.0–0.2)

## 2023-03-25 LAB — BRAIN NATRIURETIC PEPTIDE: B Natriuretic Peptide: 38 pg/mL (ref 0.0–100.0)

## 2023-03-25 MED ORDER — METHYLPREDNISOLONE SODIUM SUCC 125 MG IJ SOLR
125.0000 mg | Freq: Once | INTRAMUSCULAR | Status: AC
  Administered 2023-03-25: 125 mg via INTRAVENOUS
  Filled 2023-03-25: qty 2

## 2023-03-25 MED ORDER — HYDROCODONE BIT-HOMATROP MBR 5-1.5 MG/5ML PO SOLN: 5.0000 mL | Freq: Four times a day (QID) | ORAL | 0 refills | Status: DC | PRN

## 2023-03-25 MED ORDER — IPRATROPIUM-ALBUTEROL 0.5-2.5 (3) MG/3ML IN SOLN
RESPIRATORY_TRACT | Status: AC
  Filled 2023-03-25: qty 6

## 2023-03-25 MED ORDER — PREDNISONE 20 MG PO TABS: ORAL_TABLET | ORAL | 0 refills | Status: DC

## 2023-03-25 MED ORDER — IPRATROPIUM-ALBUTEROL 0.5-2.5 (3) MG/3ML IN SOLN
3.0000 mL | RESPIRATORY_TRACT | Status: AC
  Administered 2023-03-25 (×3): 3 mL via RESPIRATORY_TRACT
  Filled 2023-03-25: qty 3

## 2023-03-25 MED ORDER — MAGNESIUM SULFATE 2 GM/50ML IV SOLN
2.0000 g | Freq: Once | INTRAVENOUS | Status: AC
  Administered 2023-03-25: 2 g via INTRAVENOUS
  Filled 2023-03-25: qty 50

## 2023-03-25 MED ORDER — IPRATROPIUM-ALBUTEROL 0.5-2.5 (3) MG/3ML IN SOLN: 3.0000 mL | RESPIRATORY_TRACT | 1 refills | Status: DC | PRN

## 2023-03-25 MED ORDER — IPRATROPIUM-ALBUTEROL 0.5-2.5 (3) MG/3ML IN SOLN: 6.0000 mL | Freq: Once | RESPIRATORY_TRACT | Status: DC

## 2023-03-25 MED ORDER — HYDROCOD POLI-CHLORPHE POLI ER 10-8 MG/5ML PO SUER
5.0000 mL | Freq: Once | ORAL | Status: AC
  Administered 2023-03-25: 5 mL via ORAL
  Filled 2023-03-25: qty 5

## 2023-03-25 NOTE — ED Triage Notes (Signed)
Pt POV with wife d/t SOB and wheezing.  Pt had a lot of Nebs and used his inhaler a lot but did not help.  Onset 1 week ago but worse tonight and meds did not help.  Pt has hx of Sarcodosis.

## 2023-03-25 NOTE — ED Provider Notes (Signed)
[place] EMERGENCY DEPARTMENT AT [facility] Provider Note   CSN: [id number] Arrival date & time: 03/25/23  0040     History  Chief Complaint  Patient presents with   Wheezing    Glenn [name] is a 63 y.o. male.  63 year old male who presents ER today with wheezing coughing, shortness of breath.  Patient has a concentrator at home but does not use it but does have DuoNebs at home and uses those but said they do not help much.  States he has had progressively worsening symptoms related to his COPD and sarcoidosis for quite a while.  He states that tonight he got worse he presents here for further evaluation.  States that his cough medicine does not work for a while and he spelt it.  No fevers.  No other infectious symptoms.   Wheezing      Home Medications Prior to Admission medications   Medication Sig Start Date End Date Taking? Authorizing Provider  HYDROcodone bit-homatropine (HYCODAN) 5-1.5 MG/5ML syrup Take 5 mLs by mouth every 6 (six) hours as needed for cough. 03/25/23  Yes Terriona Horlacher, Barbara Cower, MD  ipratropium-albuterol (DUONEB) 0.5-2.5 (3) MG/3ML SOLN Take 3 mLs by nebulization every 4 (four) hours as needed. 03/25/23  Yes Silas Muff, Barbara Cower, MD  predniSONE (DELTASONE) 20 MG tablet 2 tabs po daily x 4 days 03/25/23  Yes Suhayla Chisom, Barbara Cower, MD  albuterol (PROVENTIL) (2.5 MG/3ML) 0.083% nebulizer solution Take 3 mLs (2.5 mg total) by nebulization every 4 (four) hours as needed for wheezing or shortness of breath. 01/26/23 01/26/24  Elson Areas, PA-C  albuterol (VENTOLIN HFA) 108 (90 Base) MCG/ACT inhaler Inhale 1-2 puffs into the lungs every 6 (six) hours as needed for wheezing or shortness of breath. 12/17/22 01/16/23  Anders Simmonds T, DO  AMITIZA 24 MCG capsule Take 24 mcg by mouth daily as needed for constipation. 03/31/20   [provider]  amLODipine (NORVASC) 10 MG tablet Take 1 tablet (10 mg total) by mouth daily. 10/03/21   Rai, Delene Ruffini, MD  Ascorbic Acid  (VITAMIN C PO) Take 2 tablets by mouth daily.    [provider]  atorvastatin (LIPITOR) 40 MG tablet Take 1 tablet (40 mg total) by mouth daily. 06/26/20   Pokhrel, Rebekah Chesterfield, MD  budesonide-formoterol (SYMBICORT) 160-4.5 MCG/ACT inhaler Inhale 2 puffs into the lungs 2 (two) times daily. 12/29/22   [name], MD  clopidogrel (PLAVIX) 75 MG tablet Take 75 mg by mouth daily. 07/06/21   [provider]  Dextromethorphan HBr 15 MG TABS 30 mg orally every 6 to 8 hours; MAX, 120 mg/day 01/15/22   Arthor Captain, PA-C  famotidine (PEPCID) 20 MG tablet One after supper Patient taking differently: Take 20 mg by mouth daily. 11/22/21   [name], MD  fluticasone (FLONASE) 50 MCG/ACT nasal spray Place 2 sprays into both nostrils daily. 11/19/21   Ghimire, Werner Lean, MD  gabapentin (NEURONTIN) 300 MG capsule Take 1 capsule (300 mg total) by mouth 3 (three) times daily. Patient taking differently: Take 600 mg by mouth daily. 02/09/18   Eustace Moore, MD  guaiFENesin (MUCINEX) 600 MG 12 hr tablet Take 2 tablets (1,200 mg total) by mouth 2 (two) times daily. 10/13/22   Small, Brooke L, PA  loratadine (CLARITIN) 10 MG tablet Take 1 tablet (10 mg total) by mouth daily. 01/03/22     losartan-hydrochlorothiazide (HYZAAR) 100-12.5 MG tablet Take 1 tablet by mouth daily. 09/03/21   [provider]  metFORMIN (GLUCOPHAGE)  500 MG tablet Take 1 tablet (500 mg total) by mouth 2 (two) times daily with a meal. 10/03/21   Rai, Ripudeep K, MD  naloxone [facility]) nasal spray 4 mg/0.1 mL SMARTSIG:Both Nares 08/20/21   [provider]  nitroGLYCERIN (NITROSTAT) 0.4 MG SL tablet Place 0.4 mg under the tongue every 5 (five) minutes as needed for chest pain.    [provider]  Oxycodone HCl 20 MG TABS Take 20 mg by mouth 4 (four) times daily. 03/31/20   [provider]  pantoprazole (PROTONIX) 40 MG tablet Take 1 tablet (40 mg total) by mouth daily. 07/02/19   Elgergawy,  Leana Roe, MD  sildenafil (VIAGRA) 100 MG tablet Take 1 tablet (100 mg total) by mouth daily as needed. 01/20/23     tamsulosin (FLOMAX) 0.4 MG CAPS capsule Take 0.4 mg by mouth daily. 04/26/21   [provider]  Tiotropium Bromide Monohydrate 2.5 MCG/ACT AERS Inhale 2 each into the lungs daily.    [provider]  Tiotropium Bromide-Olodaterol (STIOLTO RESPIMAT) 2.5-2.5 MCG/ACT AERS Inhale 2 puffs into the lungs daily. 01/18/22   [name], MD  Vitamin D, Ergocalciferol, (DRISDOL) 1.25 MG (50000 UNIT) CAPS capsule Take 50,000 Units by mouth once a week. Mondays 05/02/19   [provider]  XTAMPZA ER 9 MG C12A Take 9 mg by mouth 2 (two) times daily. 04/09/21   [provider]      Allergies    Insulins, Ibuprofen, Pork-derived products, Shrimp [shellfish allergy], Tylenol [acetaminophen], and Lisinopril    Review of Systems   Review of Systems  Respiratory:  Positive for wheezing.     Physical Exam Updated Vital Signs BP (!) 172/110   Pulse 93   Temp 97.9 F (36.6 C) (Oral)   Resp 18   Ht 5\' 5"  (1.651 m)   Wt 65.8 kg   SpO2 93%   BMI 24.13 kg/m  Physical Exam Vitals and nursing note reviewed.  Constitutional:      Appearance: He is well-developed.  HENT:     Head: Normocephalic and atraumatic.     Nose: Nose normal. No congestion or rhinorrhea.  Cardiovascular:     Rate and Rhythm: Normal rate.  Pulmonary:     Effort: Pulmonary effort is normal. Tachypnea present. No respiratory distress.     Breath sounds: Decreased air movement present. No wheezing.  Abdominal:     General: There is no distension.  Musculoskeletal:        General: Normal range of motion.     Cervical back: Normal range of motion.  Skin:    General: Skin is warm and dry.  Neurological:     Mental Status: He is alert.     ED Results / Procedures / Treatments   Labs (all labs ordered are listed, but only abnormal results are displayed) Labs Reviewed   COMPREHENSIVE METABOLIC PANEL - Abnormal; Notable for the following components:      Result Value   CO2 33 (*)    Glucose, Bld 167 (*)    Calcium 8.8 (*)    All other components within normal limits  CBC WITH DIFFERENTIAL/PLATELET - Abnormal; Notable for the following components:   Eosinophils Absolute 0.6 (*)    All other components within normal limits  BRAIN NATRIURETIC PEPTIDE    EKG EKG Interpretation Date/Time:  Saturday March 25 2023 01:05:03 EST Ventricular Rate:  107 PR Interval:  122 QRS Duration:  76 QT Interval:  368 QTC Calculation: 491 R  Axis:   34  Text Interpretation: Sinus tachycardia Minimal voltage criteria for LVH, may be normal variant ( Sokolow-Lyon ) Nonspecific ST and T wave abnormality Abnormal ECG When compared with ECG of 26-Jan-2023 09:17, ST elevation now present in Inferior leads QT has lengthened Confirmed by Marily Memos [id number]) on 03/25/2023 1:55:29 AM  Radiology DG Chest Portable 1 View Result Date: 03/25/2023 CLINICAL DATA:  Dyspnea EXAM: PORTABLE CHEST 1 VIEW COMPARISON:  01/26/2023 FINDINGS: Status post partial left lung resection with elevation of the left hemidiaphragm. Surgical staple line noted at the right apex in keeping with wedge resection. Small parenchymal scarring within the residual left lung. Lungs are otherwise clear. No pneumothorax or pleural effusion. Cardiac size within normal limits. Pulmonary vascularity is normal. No acute bone abnormality. IMPRESSION: 1. No active disease. 2. Status post partial left lung resection. Electronically Signed   By: Helyn Numbers M.D.   On: 03/25/2023 01:49    Procedures Procedures    Medications Ordered in ED Medications  ipratropium-albuterol (DUONEB) 0.5-2.5 (3) MG/3ML nebulizer solution 3 mL (3 mLs Nebulization Given 03/25/23 0110)  magnesium sulfate IVPB 2 g 50 mL (0 g Intravenous Stopped 03/25/23 0217)  methylPREDNISolone sodium succinate (SOLU-MEDROL) 125 mg/2 mL injection 125 mg  (125 mg Intravenous Given 03/25/23 0109)  chlorpheniramine-HYDROcodone (TUSSIONEX) 10-8 MG/5ML suspension 5 mL (5 mLs Oral Given 03/25/23 0206)    ED Course/ Medical Decision Making/ A&P                                 Medical Decision Making Amount and/or Complexity of Data Reviewed Labs: ordered. Radiology: ordered. ECG/medicine tests: ordered.  Risk Prescription drug management.   Patient given brief treatments and steroids here with improvement in symptoms.  Patient states he feels at baseline.  Sat 90-92 at rest with no tachypnea.  On ambulation his sats dropped into the mid 80s and patient states that this is normal.  States he feels like he normally does over the last couple years.  He is supposed to an oxygen at home which is never used it because he thought it was optional.  I suspect he quite still needed with exertion provide is not needed at baseline.  I offered staying in the hospital versus discharge initially his wife wanted him to be hospitalized because she thought he was still bad but the patient states he feels great and prefer to go home.  I left let them discuss it and he stated that he thought he was fine to go home if he uses oxygen, got some new prescriptions he thought he be fine to follow-up with his primary doctors.  These were provided.  Patient appeared well and at reported baseline at time of discharge.     Final Clinical Impression(s) / ED Diagnoses Final diagnoses:  Chronic obstructive pulmonary disease with acute exacerbation (HCC)    Rx / DC Orders ED Discharge Orders          Ordered    HYDROcodone bit-homatropine (HYCODAN) 5-1.5 MG/5ML syrup  Every 6 hours PRN        03/25/23 0338    predniSONE (DELTASONE) 20 MG tablet        03/25/23 0338    ipratropium-albuterol (DUONEB) 0.5-2.5 (3) MG/3ML SOLN  Every 4 hours PRN        03/25/23 0338              Lavert Matousek, Barbara Cower, MD 03/25/23  0627  

## 2023-03-25 NOTE — ED Notes (Signed)
Pt dropped to 83% and was SOB but pt and wife states that is normal and he is back to Baseline.

## 2023-04-05 ENCOUNTER — Other Ambulatory Visit (HOSPITAL_COMMUNITY): Payer: Self-pay

## 2023-05-16 ENCOUNTER — Other Ambulatory Visit (HOSPITAL_COMMUNITY): Payer: Self-pay

## 2023-05-19 ENCOUNTER — Encounter (HOSPITAL_BASED_OUTPATIENT_CLINIC_OR_DEPARTMENT_OTHER): Payer: Self-pay | Admitting: Emergency Medicine

## 2023-05-19 ENCOUNTER — Emergency Department (HOSPITAL_BASED_OUTPATIENT_CLINIC_OR_DEPARTMENT_OTHER)
Admission: EM | Admit: 2023-05-19 | Discharge: 2023-05-19 | Disposition: A | Discharge status: Home / Self Care | Attending: Emergency Medicine | Admitting: Emergency Medicine

## 2023-05-19 ENCOUNTER — Other Ambulatory Visit: Payer: Self-pay

## 2023-05-19 ENCOUNTER — Emergency Department (HOSPITAL_BASED_OUTPATIENT_CLINIC_OR_DEPARTMENT_OTHER)

## 2023-05-19 DIAGNOSIS — Z79899 Other long term (current) drug therapy: Secondary | ICD-10-CM | POA: Insufficient documentation

## 2023-05-19 DIAGNOSIS — Z7902 Long term (current) use of antithrombotics/antiplatelets: Secondary | ICD-10-CM | POA: Diagnosis not present

## 2023-05-19 DIAGNOSIS — J441 Chronic obstructive pulmonary disease with (acute) exacerbation: Secondary | ICD-10-CM | POA: Insufficient documentation

## 2023-05-19 DIAGNOSIS — R0602 Shortness of breath: Secondary | ICD-10-CM | POA: Diagnosis present

## 2023-05-19 DIAGNOSIS — Z7951 Long term (current) use of inhaled steroids: Secondary | ICD-10-CM | POA: Diagnosis not present

## 2023-05-19 LAB — BASIC METABOLIC PANEL WITH GFR
Anion gap: 6 (ref 5–15)
BUN: 16 mg/dL (ref 8–23)
CO2: 31 mmol/L (ref 22–32)
Calcium: 9 mg/dL (ref 8.9–10.3)
Chloride: 102 mmol/L (ref 98–111)
Creatinine, Ser: 0.97 mg/dL (ref 0.61–1.24)
GFR, Estimated: >60 mL/min (ref >60)
Glucose, Bld: 180 mg/dL — ABNORMAL HIGH (ref 70–99)
Potassium: 3.8 mmol/L (ref 3.5–5.1)
Sodium: 139 mmol/L (ref 135–145)

## 2023-05-19 LAB — CBC WITH DIFFERENTIAL/PLATELET
Abs Immature Granulocytes: 0.01 10*3/uL (ref 0.00–0.07)
Basophils Absolute: 0 10*3/uL (ref 0.0–0.1)
Basophils Relative: 1 %
Eosinophils Absolute: 0.4 10*3/uL (ref 0.0–0.5)
Eosinophils Relative: 7 %
HCT: 42.3 % (ref 39.0–52.0)
Hemoglobin: 13.6 g/dL (ref 13.0–17.0)
Immature Granulocytes: 0 %
Lymphocytes Relative: 36 %
Lymphs Abs: 2 10*3/uL (ref 0.7–4.0)
MCH: 27.8 pg (ref 26.0–34.0)
MCHC: 32.2 g/dL (ref 30.0–36.0)
MCV: 86.5 fL (ref 80.0–100.0)
Monocytes Absolute: 0.7 10*3/uL (ref 0.1–1.0)
Monocytes Relative: 13 %
Neutro Abs: 2.4 10*3/uL (ref 1.7–7.7)
Neutrophils Relative %: 43 %
Platelets: 269 10*3/uL (ref 150–400)
RBC: 4.89 MIL/uL (ref 4.22–5.81)
RDW: 13.7 % (ref 11.5–15.5)
WBC: 5.6 10*3/uL (ref 4.0–10.5)
nRBC: 0 % (ref 0.0–0.2)

## 2023-05-19 LAB — RESP PANEL BY RT-PCR (RSV, FLU A&B, COVID)  RVPGX2
Influenza A by PCR: NEGATIVE
Influenza B by PCR: NEGATIVE
Resp Syncytial Virus by PCR: NEGATIVE
SARS Coronavirus 2 by RT PCR: NEGATIVE

## 2023-05-19 MED ORDER — DEXAMETHASONE SODIUM PHOSPHATE 10 MG/ML IJ SOLN
10.0000 mg | Freq: Once | INTRAMUSCULAR | Status: AC
  Administered 2023-05-19: 10 mg via INTRAVENOUS
  Filled 2023-05-19: qty 1

## 2023-05-19 MED ORDER — PREDNISONE 20 MG PO TABS: ORAL_TABLET | ORAL | 0 refills | Status: DC

## 2023-05-19 MED ORDER — IPRATROPIUM-ALBUTEROL 0.5-2.5 (3) MG/3ML IN SOLN
3.0000 mL | Freq: Once | RESPIRATORY_TRACT | Status: AC
  Administered 2023-05-19: 3 mL via RESPIRATORY_TRACT
  Filled 2023-05-19: qty 3

## 2023-05-19 MED ORDER — ALBUTEROL SULFATE (2.5 MG/3ML) 0.083% IN NEBU
2.5000 mg | INHALATION_SOLUTION | Freq: Once | RESPIRATORY_TRACT | Status: AC
Start: 2023-05-19 — End: 2023-05-19
  Administered 2023-05-19: 2.5 mg via RESPIRATORY_TRACT
  Filled 2023-05-19: qty 3

## 2023-05-19 MED ORDER — HYDROCODONE BIT-HOMATROP MBR 5-1.5 MG/5ML PO SOLN: 5.0000 mL | Freq: Four times a day (QID) | ORAL | 0 refills | Status: DC | PRN

## 2023-05-19 MED ORDER — ALBUTEROL SULFATE (2.5 MG/3ML) 0.083% IN NEBU
5.0000 mg | INHALATION_SOLUTION | Freq: Once | RESPIRATORY_TRACT | Status: AC
  Administered 2023-05-19: 5 mg via RESPIRATORY_TRACT
  Filled 2023-05-19: qty 6

## 2023-05-19 NOTE — ED Provider Notes (Signed)
  EMERGENCY DEPARTMENT AT [facility] Provider Note   CSN: [id number] Arrival date & time: 05/19/23  4098     History  Chief Complaint  Patient presents with   Shortness of Breath    Glenn [name] is a 63 y.o. male.  Patient is a 63 year old male who presents with shortness of breath.  He has a history of COPD and sarcoidosis.  He presents with worsening wheezing and shortness of breath.  He states he has been having difficulties for about the last month.  He has been using his nebulizer treatments more frequently.  He is planning to go out of town tomorrow and wanted to see if he can get his breathing squared away.  He denies any fevers.  No associated chest pain.  No leg pain or swelling.  He does have a cough which is productive of clear sputum but also sometimes some brown sputum.  He does see Dr. Sherene Sires with pulmonology but has not seen him recently.       Home Medications Prior to Admission medications   Medication Sig Start Date End Date Taking? Authorizing Provider  predniSONE (DELTASONE) 20 MG tablet 2 tabs po daily x 4 days 05/19/23  Yes Rolan Bucco, MD  albuterol (PROVENTIL) (2.5 MG/3ML) 0.083% nebulizer solution Take 3 mLs (2.5 mg total) by nebulization every 4 (four) hours as needed for wheezing or shortness of breath. 01/26/23 01/26/24  Elson Areas, PA-C  albuterol (VENTOLIN HFA) 108 (90 Base) MCG/ACT inhaler Inhale 1-2 puffs into the lungs every 6 (six) hours as needed for wheezing or shortness of breath. 12/17/22 01/16/23  Anders Simmonds T, DO  AMITIZA 24 MCG capsule Take 24 mcg by mouth daily as needed for constipation. 03/31/20   [provider]  amLODipine (NORVASC) 10 MG tablet Take 1 tablet (10 mg total) by mouth daily. 10/03/21   Rai, Delene Ruffini, MD  Ascorbic Acid (VITAMIN C PO) Take 2 tablets by mouth daily.    [provider]  atorvastatin (LIPITOR) 40 MG tablet Take 1 tablet (40 mg total) by mouth daily. 06/26/20   Pokhrel,  Rebekah Chesterfield, MD  budesonide-formoterol (SYMBICORT) 160-4.5 MCG/ACT inhaler Inhale 2 puffs into the lungs 2 (two) times daily. 12/29/22   [name], MD  clopidogrel (PLAVIX) 75 MG tablet Take 75 mg by mouth daily. 07/06/21   [provider]  Dextromethorphan HBr 15 MG TABS 30 mg orally every 6 to 8 hours; MAX, 120 mg/day 01/15/22   Arthor Captain, PA-C  famotidine (PEPCID) 20 MG tablet One after supper Patient taking differently: Take 20 mg by mouth daily. 11/22/21   [name], MD  fluticasone (FLONASE) 50 MCG/ACT nasal spray Place 2 sprays into both nostrils daily. 11/19/21   Ghimire, Werner Lean, MD  gabapentin (NEURONTIN) 300 MG capsule Take 1 capsule (300 mg total) by mouth 3 (three) times daily. Patient taking differently: Take 600 mg by mouth daily. 02/09/18   Eustace Moore, MD  guaiFENesin (MUCINEX) 600 MG 12 hr tablet Take 2 tablets (1,200 mg total) by mouth 2 (two) times daily. 10/13/22   Small, Brooke L, PA  HYDROcodone bit-homatropine (HYCODAN) 5-1.5 MG/5ML syrup Take 5 mLs by mouth every 6 (six) hours as needed for cough. 05/19/23   Rolan Bucco, MD  ipratropium-albuterol (DUONEB) 0.5-2.5 (3) MG/3ML SOLN Take 3 mLs by nebulization every 4 (four) hours as needed. 03/25/23   Mesner, Barbara Cower, MD  loratadine (CLARITIN) 10 MG tablet Take 1 tablet (10 mg total) by  mouth daily. 01/03/22     losartan-hydrochlorothiazide (HYZAAR) 100-12.5 MG tablet Take 1 tablet by mouth daily. 09/03/21   [provider]  metFORMIN (GLUCOPHAGE) 500 MG tablet Take 1 tablet (500 mg total) by mouth 2 (two) times daily with a meal. 10/03/21   Rai, Ripudeep K, MD  naloxone [name]) nasal spray 4 mg/0.1 mL SMARTSIG:Both Nares 08/20/21   [provider]  nitroGLYCERIN (NITROSTAT) 0.4 MG SL tablet Place 0.4 mg under the tongue every 5 (five) minutes as needed for chest pain.    [provider]  Oxycodone HCl 20 MG TABS Take 20 mg by mouth 4 (four) times daily. 03/31/20   [provider]  pantoprazole (PROTONIX) 40 MG tablet Take 1 tablet (40 mg total) by mouth daily. 07/02/19   Elgergawy, Leana Roe, MD  sildenafil (VIAGRA) 100 MG tablet Take 1 tablet (100 mg total) by mouth daily as needed. 01/20/23     tamsulosin (FLOMAX) 0.4 MG CAPS capsule Take 0.4 mg by mouth daily. 04/26/21   [provider]  Tiotropium Bromide Monohydrate 2.5 MCG/ACT AERS Inhale 2 each into the lungs daily.    [provider]  Tiotropium Bromide-Olodaterol (STIOLTO RESPIMAT) 2.5-2.5 MCG/ACT AERS Inhale 2 puffs into the lungs daily. 01/18/22   [name], MD  Vitamin D, Ergocalciferol, (DRISDOL) 1.25 MG (50000 UNIT) CAPS capsule Take 50,000 Units by mouth once a week. Mondays 05/02/19   [provider]  XTAMPZA ER 9 MG C12A Take 9 mg by mouth 2 (two) times daily. 04/09/21   [provider]      Allergies    Insulins, Ibuprofen, Pork-derived products, Shrimp [shellfish allergy], Tylenol [acetaminophen], and Lisinopril    Review of Systems   Review of Systems  Constitutional:  Positive for fatigue. Negative for chills, diaphoresis and fever.  HENT:  Negative for congestion, rhinorrhea and sneezing.   Eyes: Negative.   Respiratory:  Positive for cough, shortness of breath and wheezing. Negative for chest tightness.   Cardiovascular:  Negative for chest pain and leg swelling.  Gastrointestinal:  Negative for abdominal pain, blood in stool, diarrhea, nausea and vomiting.  Genitourinary:  Negative for difficulty urinating, flank pain, frequency and hematuria.  Musculoskeletal:  Negative for arthralgias and back pain.  Skin:  Negative for rash.  Neurological:  Negative for dizziness, speech difficulty, weakness, numbness and headaches.    Physical Exam Updated Vital Signs BP (!) 151/94   Pulse (!) 103   Temp 98.2 F (36.8 C) (Oral)   Resp 18   SpO2 96%  Physical Exam Constitutional:      Appearance: He is well-developed.  HENT:     Head:  Normocephalic and atraumatic.  Eyes:     Pupils: Pupils are equal, round, and reactive to light.  Cardiovascular:     Rate and Rhythm: Normal rate and regular rhythm.     Heart sounds: Normal heart sounds.  Pulmonary:     Effort: Pulmonary effort is normal. Tachypnea present. No respiratory distress.     Breath sounds: Decreased breath sounds and wheezing present. No rales.  Chest:     Chest wall: No tenderness.  Abdominal:     General: Bowel sounds are normal.     Palpations: Abdomen is soft.     Tenderness: There is no abdominal tenderness. There is no guarding or rebound.  Musculoskeletal:        General: Normal range of motion.     Cervical back: Normal range of motion and neck  supple.     Comments: No edema or calf tenderness  Lymphadenopathy:     Cervical: No cervical adenopathy.  Skin:    General: Skin is warm and dry.     Findings: No rash.  Neurological:     Mental Status: He is alert and oriented to person, place, and time.     ED Results / Procedures / Treatments   Labs (all labs ordered are listed, but only abnormal results are displayed) Labs Reviewed  BASIC METABOLIC PANEL WITH GFR - Abnormal; Notable for the following components:      Result Value   Glucose, Bld 180 (*)    All other components within normal limits  RESP PANEL BY RT-PCR (RSV, FLU A&B, COVID)  [id number]  CBC WITH DIFFERENTIAL/PLATELET    EKG EKG Interpretation Date/Time:  Friday May 19 2023 07:22:35 EDT Ventricular Rate:  108 PR Interval:  135 QRS Duration:  85 QT Interval:  332 QTC Calculation: 445 R Axis:   58  Text Interpretation: Sinus tachycardia Probable left atrial enlargement Nonspecific T abnormalities, lateral leads since last tracing no significant change Confirmed by Rolan Bucco [phone]) on 05/19/2023 8:32:46 AM  Radiology DG Chest Port 1 View Result Date: 05/19/2023 CLINICAL DATA:  Cough and shortness of breath. EXAM: PORTABLE CHEST 1 VIEW COMPARISON:  03/25/23 FINDINGS:  Stable cardiomediastinal contours. Postoperative changes from partial resection of the left lung with volume loss and asymmetric elevation of the left hemidiaphragm. This appears stable when compared with the previous exam. Scarring is noted within the right base. No superimposed interstitial edema, airspace disease or pneumothorax. The visualized osseous structures are unremarkable. IMPRESSION: 1. No acute cardiopulmonary abnormalities. 2. Postoperative changes from partial resection of the left lung with volume loss and asymmetric elevation of the left hemidiaphragm. 3. Right basilar scarring. Electronically Signed   By: Signa Kell M.D.   On: 05/19/2023 08:08    Procedures Procedures    Medications Ordered in ED Medications  ipratropium-albuterol (DUONEB) 0.5-2.5 (3) MG/3ML nebulizer solution 3 mL (3 mLs Nebulization Given 05/19/23 0721)  albuterol (PROVENTIL) (2.5 MG/3ML) 0.083% nebulizer solution 2.5 mg (2.5 mg Nebulization Given 05/19/23 0721)  dexamethasone (DECADRON) injection 10 mg (10 mg Intravenous Given 05/19/23 0816)  albuterol (PROVENTIL) (2.5 MG/3ML) 0.083% nebulizer solution 5 mg (5 mg Nebulization Given 05/19/23 1010)    ED Course/ Medical Decision Making/ A&P                                 Medical Decision Making Amount and/or Complexity of Data Reviewed Labs: ordered. Radiology: ordered.  Risk Prescription drug management.   Patient is a 63 year old male who presents with shortness of breath and wheezing.  He has a history of COPD.  He does not have any associated chest pain.  His lungs have diminished breath sounds and associated wheezing.  He was given 3 nebulizer treatments here.  He was also given Decadron.  His symptoms have greatly improved.  He is talking in full sentences with no accessory muscle use.  Chest x-ray was performed which does not show any evidence of pneumonia or pulmonary edema.  This was interpreted by me and confirmed by the radiologist.  He does  not have other symptoms that are more consistent with fluid overload.  He has had some ongoing tachycardia this been mild with a heart rate of just over 100.  It has at times gone down into the 90s.  He does not have other symptoms that would be more concerning for PE.  No pleuritic pain.  No unilateral leg swelling.  Discussed with him further observation of his heart rate and possibly would need to further explore PE but he declines and says that he will leave AMA if he has to but needs to leave.  His clinical symptoms seem more consistent with a COPD exacerbation.  He was discharged home in good condition.  He was given a prescription for 4-day course of prednisone to start tomorrow.  He request a refill on his Hycodan cough syrup which he says really helped his cough.  He does get chronic opioid pain medication.  Will give him a one-time refill on his Hycodan cough syrup.  He will follow-up with his doctor at the [facility].  Return precautions were given.  CRITICAL CARE Performed by: Rolan Bucco Total critical care time: 80 minutes Critical care time was exclusive of separately billable procedures and treating other patients. Critical care was necessary to treat or prevent imminent or life-threatening deterioration. Critical care was time spent personally by me on the following activities: development of treatment plan with patient and/or surrogate as well as nursing, discussions with consultants, evaluation of patient's response to treatment, examination of patient, obtaining history from patient or surrogate, ordering and performing treatments and interventions, ordering and review of laboratory studies, ordering and review of radiographic studies, pulse oximetry and re-evaluation of patient's condition.   Final Clinical Impression(s) / ED Diagnoses Final diagnoses:  COPD exacerbation (HCC)    Rx / DC Orders ED Discharge Orders          Ordered    HYDROcodone bit-homatropine (HYCODAN)  5-1.5 MG/5ML syrup  Every 6 hours PRN        05/19/23 1330    predniSONE (DELTASONE) 20 MG tablet        05/19/23 1330              Rolan Bucco, MD 05/19/23 1351

## 2023-05-19 NOTE — ED Notes (Signed)
 Placed patient on 2L d/t low oxygen levels. RT at bedside.

## 2023-05-19 NOTE — ED Notes (Signed)
 RT Note: Patient was placed on 2lpm [place] due to a low oxygen saturation 86% room air. Patient came in in respiratory ditress. He is tolerating oxygen well at 2lpm with an oxygen saturation 95%   Patient was also given a breathing treatment.

## 2023-05-19 NOTE — Discharge Instructions (Addendum)
 Follow-up with your doctor at the [place].  Return to the emergency room if you have any worsening symptoms.

## 2023-05-19 NOTE — ED Triage Notes (Signed)
 C/o SHOB and cough x 3 weeks. States been using nebs and inhaler nonstop with no relief. Hx of COPD and asthma. Audible wheezes.

## 2023-05-19 NOTE — ED Notes (Signed)
 RT Note: Patient oxygen saturation on room air while at rest=92%, HR 92, RR 18 Patient oxygen saturation on room air while ambulating= 89%, HR 112, RR 24  Patient stated that he has oxygen at home for PRN use

## 2023-06-21 ENCOUNTER — Other Ambulatory Visit (HOSPITAL_COMMUNITY): Payer: Self-pay

## 2023-06-22 ENCOUNTER — Ambulatory Visit: Admitting: Podiatry

## 2023-07-19 ENCOUNTER — Encounter (HOSPITAL_COMMUNITY): Payer: Self-pay | Admitting: Emergency Medicine

## 2023-07-19 ENCOUNTER — Inpatient Hospital Stay (HOSPITAL_BASED_OUTPATIENT_CLINIC_OR_DEPARTMENT_OTHER)
Admission: EM | Admit: 2023-07-19 | Discharge: 2023-07-23 | DRG: 189 | Disposition: A | Discharge status: Home / Self Care | Attending: Obstetrics and Gynecology | Admitting: Obstetrics and Gynecology

## 2023-07-19 ENCOUNTER — Emergency Department (HOSPITAL_BASED_OUTPATIENT_CLINIC_OR_DEPARTMENT_OTHER): Admitting: Radiology

## 2023-07-19 ENCOUNTER — Other Ambulatory Visit: Payer: Self-pay

## 2023-07-19 DIAGNOSIS — Z7902 Long term (current) use of antithrombotics/antiplatelets: Secondary | ICD-10-CM

## 2023-07-19 DIAGNOSIS — Z888 Allergy status to other drugs, medicaments and biological substances status: Secondary | ICD-10-CM

## 2023-07-19 DIAGNOSIS — E119 Type 2 diabetes mellitus without complications: Secondary | ICD-10-CM | POA: Diagnosis present

## 2023-07-19 DIAGNOSIS — Z91014 Allergy to mammalian meats: Secondary | ICD-10-CM

## 2023-07-19 DIAGNOSIS — E785 Hyperlipidemia, unspecified: Secondary | ICD-10-CM | POA: Diagnosis present

## 2023-07-19 DIAGNOSIS — Z91013 Allergy to seafood: Secondary | ICD-10-CM

## 2023-07-19 DIAGNOSIS — J441 Chronic obstructive pulmonary disease with (acute) exacerbation: Secondary | ICD-10-CM | POA: Diagnosis present

## 2023-07-19 DIAGNOSIS — N400 Enlarged prostate without lower urinary tract symptoms: Secondary | ICD-10-CM | POA: Diagnosis present

## 2023-07-19 DIAGNOSIS — G8929 Other chronic pain: Secondary | ICD-10-CM | POA: Diagnosis present

## 2023-07-19 DIAGNOSIS — Z862 Personal history of diseases of the blood and blood-forming organs and certain disorders involving the immune mechanism: Secondary | ICD-10-CM

## 2023-07-19 DIAGNOSIS — Z886 Allergy status to analgesic agent status: Secondary | ICD-10-CM

## 2023-07-19 DIAGNOSIS — T465X6A Underdosing of other antihypertensive drugs, initial encounter: Secondary | ICD-10-CM | POA: Diagnosis present

## 2023-07-19 DIAGNOSIS — Z7982 Long term (current) use of aspirin: Secondary | ICD-10-CM

## 2023-07-19 DIAGNOSIS — J440 Chronic obstructive pulmonary disease with acute lower respiratory infection: Secondary | ICD-10-CM | POA: Diagnosis present

## 2023-07-19 DIAGNOSIS — Z91148 Patient's other noncompliance with medication regimen for other reason: Secondary | ICD-10-CM

## 2023-07-19 DIAGNOSIS — Z8709 Personal history of other diseases of the respiratory system: Secondary | ICD-10-CM

## 2023-07-19 DIAGNOSIS — Z79891 Long term (current) use of opiate analgesic: Secondary | ICD-10-CM | POA: Diagnosis not present

## 2023-07-19 DIAGNOSIS — Z1152 Encounter for screening for COVID-19: Secondary | ICD-10-CM | POA: Diagnosis not present

## 2023-07-19 DIAGNOSIS — Z8249 Family history of ischemic heart disease and other diseases of the circulatory system: Secondary | ICD-10-CM

## 2023-07-19 DIAGNOSIS — I10 Essential (primary) hypertension: Secondary | ICD-10-CM | POA: Diagnosis present

## 2023-07-19 DIAGNOSIS — Z87891 Personal history of nicotine dependence: Secondary | ICD-10-CM | POA: Diagnosis not present

## 2023-07-19 DIAGNOSIS — J9601 Acute respiratory failure with hypoxia: Principal | ICD-10-CM | POA: Diagnosis present

## 2023-07-19 DIAGNOSIS — Z8673 Personal history of transient ischemic attack (TIA), and cerebral infarction without residual deficits: Secondary | ICD-10-CM | POA: Diagnosis not present

## 2023-07-19 DIAGNOSIS — J189 Pneumonia, unspecified organism: Secondary | ICD-10-CM | POA: Diagnosis present

## 2023-07-19 DIAGNOSIS — Z7984 Long term (current) use of oral hypoglycemic drugs: Secondary | ICD-10-CM | POA: Diagnosis not present

## 2023-07-19 DIAGNOSIS — Z79899 Other long term (current) drug therapy: Secondary | ICD-10-CM | POA: Diagnosis not present

## 2023-07-19 DIAGNOSIS — Z7951 Long term (current) use of inhaled steroids: Secondary | ICD-10-CM | POA: Diagnosis not present

## 2023-07-19 DIAGNOSIS — E1169 Type 2 diabetes mellitus with other specified complication: Secondary | ICD-10-CM | POA: Diagnosis present

## 2023-07-19 DIAGNOSIS — Z833 Family history of diabetes mellitus: Secondary | ICD-10-CM

## 2023-07-19 LAB — BASIC METABOLIC PANEL WITH GFR
Anion gap: 12 (ref 5–15)
BUN: 12 mg/dL (ref 8–23)
CO2: 27 mmol/L (ref 22–32)
Calcium: 9 mg/dL (ref 8.9–10.3)
Chloride: 95 mmol/L — ABNORMAL LOW (ref 98–111)
Creatinine, Ser: 0.76 mg/dL (ref 0.61–1.24)
GFR, Estimated: >60 mL/min (ref >60)
Glucose, Bld: 120 mg/dL — ABNORMAL HIGH (ref 70–99)
Potassium: 3.9 mmol/L (ref 3.5–5.1)
Sodium: 134 mmol/L — ABNORMAL LOW (ref 135–145)

## 2023-07-19 LAB — CBC
HCT: 39.2 % (ref 39.0–52.0)
Hemoglobin: 12.7 g/dL — ABNORMAL LOW (ref 13.0–17.0)
MCH: 27.1 pg (ref 26.0–34.0)
MCHC: 32.4 g/dL (ref 30.0–36.0)
MCV: 83.6 fL (ref 80.0–100.0)
Platelets: 355 10*3/uL (ref 150–400)
RBC: 4.69 MIL/uL (ref 4.22–5.81)
RDW: 13.9 % (ref 11.5–15.5)
WBC: 9.4 10*3/uL (ref 4.0–10.5)
nRBC: 0 % (ref 0.0–0.2)

## 2023-07-19 LAB — I-STAT VENOUS BLOOD GAS, ED
Acid-Base Excess: 8 mmol/L — ABNORMAL HIGH (ref 0.0–2.0)
Bicarbonate: 32.8 mmol/L — ABNORMAL HIGH (ref 20.0–28.0)
Calcium, Ion: 1.05 mmol/L — ABNORMAL LOW (ref 1.15–1.40)
HCT: 39 % (ref 39.0–52.0)
Hemoglobin: 13.3 g/dL (ref 13.0–17.0)
O2 Saturation: 93 %
Potassium: 4.8 mmol/L (ref 3.5–5.1)
Sodium: 132 mmol/L — ABNORMAL LOW (ref 135–145)
TCO2: 34 mmol/L — ABNORMAL HIGH (ref 22–32)
pCO2, Ven: 47.3 mmHg (ref 44–60)
pH, Ven: 7.449 — ABNORMAL HIGH (ref 7.25–7.43)
pO2, Ven: 64 mmHg — ABNORMAL HIGH (ref 32–45)

## 2023-07-19 LAB — TROPONIN T, HIGH SENSITIVITY
Troponin T High Sensitivity: <15 ng/L (ref <19)
Troponin T High Sensitivity: <15 ng/L

## 2023-07-19 LAB — LACTIC ACID, PLASMA
Lactic Acid, Venous: 0.8 mmol/L (ref 0.5–1.9)
Lactic Acid, Venous: 0.8 mmol/L (ref 0.5–1.9)

## 2023-07-19 MED ORDER — SODIUM CHLORIDE 0.9 % IV SOLN
500.0000 mg | Freq: Once | INTRAVENOUS | Status: AC
  Administered 2023-07-19: 500 mg via INTRAVENOUS
  Filled 2023-07-19: qty 5

## 2023-07-19 MED ORDER — IPRATROPIUM-ALBUTEROL 0.5-2.5 (3) MG/3ML IN SOLN
3.0000 mL | Freq: Once | RESPIRATORY_TRACT | Status: AC
  Administered 2023-07-19: 3 mL via RESPIRATORY_TRACT
  Filled 2023-07-19: qty 3

## 2023-07-19 MED ORDER — TAMSULOSIN HCL 0.4 MG PO CAPS
0.4000 mg | ORAL_CAPSULE | Freq: Every day | ORAL | Status: DC
  Administered 2023-07-20 – 2023-07-23 (×4): 0.4 mg via ORAL
  Filled 2023-07-19 (×4): qty 1

## 2023-07-19 MED ORDER — BUDESONIDE 0.25 MG/2ML IN SUSP
0.2500 mg | Freq: Two times a day (BID) | RESPIRATORY_TRACT | Status: DC
  Administered 2023-07-19 – 2023-07-23 (×8): 0.25 mg via RESPIRATORY_TRACT
  Filled 2023-07-19 (×8): qty 2

## 2023-07-19 MED ORDER — OXYCODONE HCL 5 MG PO TABS
20.0000 mg | ORAL_TABLET | Freq: Four times a day (QID) | ORAL | Status: DC | PRN
  Administered 2023-07-19 – 2023-07-23 (×14): 20 mg via ORAL
  Filled 2023-07-19 (×14): qty 4

## 2023-07-19 MED ORDER — HYDROCODONE BIT-HOMATROP MBR 5-1.5 MG/5ML PO SOLN
5.0000 mL | Freq: Four times a day (QID) | ORAL | Status: DC | PRN
  Administered 2023-07-20 – 2023-07-22 (×5): 5 mL via ORAL
  Filled 2023-07-19 (×5): qty 5

## 2023-07-19 MED ORDER — IPRATROPIUM-ALBUTEROL 0.5-2.5 (3) MG/3ML IN SOLN
RESPIRATORY_TRACT | Status: AC
  Administered 2023-07-19: 3 mL via RESPIRATORY_TRACT
  Filled 2023-07-19: qty 3

## 2023-07-19 MED ORDER — SODIUM CHLORIDE 0.9 % IV SOLN
500.0000 mg | INTRAVENOUS | Status: DC
  Administered 2023-07-20 – 2023-07-21 (×2): 500 mg via INTRAVENOUS
  Filled 2023-07-19 (×3): qty 5

## 2023-07-19 MED ORDER — METHYLPREDNISOLONE SODIUM SUCC 40 MG IJ SOLR
40.0000 mg | INTRAMUSCULAR | Status: DC
  Filled 2023-07-19: qty 1

## 2023-07-19 MED ORDER — GABAPENTIN 300 MG PO CAPS
600.0000 mg | ORAL_CAPSULE | Freq: Every day | ORAL | Status: DC
  Administered 2023-07-20 – 2023-07-23 (×4): 600 mg via ORAL
  Filled 2023-07-19 (×4): qty 2

## 2023-07-19 MED ORDER — LOSARTAN POTASSIUM 50 MG PO TABS
100.0000 mg | ORAL_TABLET | Freq: Every day | ORAL | Status: DC
  Administered 2023-07-19 – 2023-07-23 (×5): 100 mg via ORAL
  Filled 2023-07-19 (×6): qty 2

## 2023-07-19 MED ORDER — GUAIFENESIN ER 600 MG PO TB12
1200.0000 mg | ORAL_TABLET | Freq: Two times a day (BID) | ORAL | Status: DC
  Administered 2023-07-19 – 2023-07-23 (×8): 1200 mg via ORAL
  Filled 2023-07-19 (×9): qty 2

## 2023-07-19 MED ORDER — ALBUTEROL SULFATE (2.5 MG/3ML) 0.083% IN NEBU
INHALATION_SOLUTION | RESPIRATORY_TRACT | Status: AC
  Administered 2023-07-19: 2.5 mg via RESPIRATORY_TRACT
  Filled 2023-07-19: qty 3

## 2023-07-19 MED ORDER — ALBUTEROL SULFATE (2.5 MG/3ML) 0.083% IN NEBU: 2.5000 mg | INHALATION_SOLUTION | Freq: Once | RESPIRATORY_TRACT | Status: AC

## 2023-07-19 MED ORDER — CLOPIDOGREL BISULFATE 75 MG PO TABS
75.0000 mg | ORAL_TABLET | Freq: Every day | ORAL | Status: DC
  Administered 2023-07-20 – 2023-07-23 (×4): 75 mg via ORAL
  Filled 2023-07-19 (×4): qty 1

## 2023-07-19 MED ORDER — IPRATROPIUM-ALBUTEROL 0.5-2.5 (3) MG/3ML IN SOLN: 3.0000 mL | Freq: Once | RESPIRATORY_TRACT | Status: AC

## 2023-07-19 MED ORDER — IPRATROPIUM-ALBUTEROL 0.5-2.5 (3) MG/3ML IN SOLN
3.0000 mL | Freq: Four times a day (QID) | RESPIRATORY_TRACT | Status: DC
  Administered 2023-07-19 – 2023-07-22 (×10): 3 mL via RESPIRATORY_TRACT
  Filled 2023-07-19 (×11): qty 3

## 2023-07-19 MED ORDER — ONDANSETRON HCL 4 MG PO TABS: 4.0000 mg | ORAL_TABLET | Freq: Four times a day (QID) | ORAL | Status: DC | PRN

## 2023-07-19 MED ORDER — PANTOPRAZOLE SODIUM 40 MG PO TBEC
40.0000 mg | DELAYED_RELEASE_TABLET | Freq: Every day | ORAL | Status: DC
  Administered 2023-07-20 – 2023-07-23 (×4): 40 mg via ORAL
  Filled 2023-07-19 (×4): qty 1

## 2023-07-19 MED ORDER — LACTATED RINGERS IV BOLUS: 1000.0000 mL | Freq: Once | INTRAVENOUS | Status: DC

## 2023-07-19 MED ORDER — SODIUM CHLORIDE 0.9 % IV SOLN
2.0000 g | Freq: Once | INTRAVENOUS | Status: AC
  Administered 2023-07-19: 2 g via INTRAVENOUS
  Filled 2023-07-19: qty 20

## 2023-07-19 MED ORDER — ONDANSETRON HCL 4 MG/2ML IJ SOLN: 4.0000 mg | Freq: Four times a day (QID) | INTRAMUSCULAR | Status: DC | PRN

## 2023-07-19 MED ORDER — AMLODIPINE BESYLATE 10 MG PO TABS
10.0000 mg | ORAL_TABLET | Freq: Every day | ORAL | Status: DC
  Administered 2023-07-19 – 2023-07-20 (×2): 10 mg via ORAL
  Filled 2023-07-19 (×2): qty 1

## 2023-07-19 MED ORDER — LACTATED RINGERS IV BOLUS
1000.0000 mL | Freq: Once | INTRAVENOUS | Status: AC
  Administered 2023-07-19: 1000 mL via INTRAVENOUS

## 2023-07-19 MED ORDER — LORATADINE 10 MG PO TABS
10.0000 mg | ORAL_TABLET | Freq: Every day | ORAL | Status: DC
  Administered 2023-07-19 – 2023-07-23 (×5): 10 mg via ORAL
  Filled 2023-07-19 (×5): qty 1

## 2023-07-19 MED ORDER — ALBUTEROL SULFATE HFA 108 (90 BASE) MCG/ACT IN AERS: 2.0000 | INHALATION_SPRAY | RESPIRATORY_TRACT | Status: DC | PRN

## 2023-07-19 MED ORDER — ATORVASTATIN CALCIUM 40 MG PO TABS
40.0000 mg | ORAL_TABLET | Freq: Every day | ORAL | Status: DC
  Administered 2023-07-20 – 2023-07-23 (×4): 40 mg via ORAL
  Filled 2023-07-19 (×4): qty 1

## 2023-07-19 MED ORDER — OXYCODONE HCL 20 MG PO TABS: 20.0000 mg | ORAL_TABLET | Freq: Four times a day (QID) | ORAL | Status: DC

## 2023-07-19 MED ORDER — SODIUM CHLORIDE 0.9 % IV SOLN
2.0000 g | INTRAVENOUS | Status: AC
  Administered 2023-07-20 – 2023-07-23 (×4): 2 g via INTRAVENOUS
  Filled 2023-07-19 (×4): qty 20

## 2023-07-19 MED ORDER — METHYLPREDNISOLONE SODIUM SUCC 125 MG IJ SOLR
125.0000 mg | Freq: Once | INTRAMUSCULAR | Status: AC
  Administered 2023-07-19: 125 mg via INTRAVENOUS
  Filled 2023-07-19: qty 2

## 2023-07-19 MED ORDER — ALUM & MAG HYDROXIDE-SIMETH 200-200-20 MG/5ML PO SUSP: 15.0000 mL | ORAL | Status: DC | PRN

## 2023-07-19 MED ORDER — ALBUTEROL SULFATE (2.5 MG/3ML) 0.083% IN NEBU
2.5000 mg | INHALATION_SOLUTION | RESPIRATORY_TRACT | Status: DC | PRN
  Administered 2023-07-20: 2.5 mg via RESPIRATORY_TRACT
  Filled 2023-07-19: qty 3

## 2023-07-19 MED ORDER — LACTATED RINGERS IV SOLN: INTRAVENOUS | Status: AC

## 2023-07-19 MED ORDER — LUBIPROSTONE 24 MCG PO CAPS: 24.0000 ug | ORAL_CAPSULE | Freq: Every day | ORAL | Status: DC | PRN

## 2023-07-19 NOTE — ED Notes (Signed)
 RT Note: Patient is tolerating the heated high flow well at this time

## 2023-07-19 NOTE — ED Notes (Signed)
 RT Note: Patient did not tolerate the BIPAP ordered. He would not leave on his face and stated it was just too much air he couldn't do it.  He was placed on a heated high flow device and currently he is tolerating well with settings: 45l/ 30%     SPO2 92%

## 2023-07-19 NOTE — ED Notes (Signed)
 RT Note: VBG completed and handed to the Physician

## 2023-07-19 NOTE — ED Triage Notes (Signed)
 Hypoxic in triage at 78% RA c/o worsening SOB, productive cough, and wheezing x2-3 wks. PMH COPD, asthma, sarcoidosis. Uses neb and inhaler at home with minimal relief.

## 2023-07-19 NOTE — ED Notes (Signed)
 RT Note: Patient came in with saturation at 77% room air. He was given a breathing treatment of 5/5  to start. Toleratijng well at this time

## 2023-07-19 NOTE — Progress Notes (Signed)
 Plan of Care Note for accepted transfer   Patient: Glenn [name] MRN: [id number]   DOA: 07/19/2023  [facility] transfer: [facility] Requesting Provider: Abner Hoffman, MD. Reason for transfer: Acute respiratory failure with hypoxia. Facility course:  63 year old male with a past medical history of COPD/asthma and sarcoidosis who presented the emergency department with complaints of progressively worse dyspnea for the past few weeks that has not responded to his home neb treatments.  No fevers, but has been having significant cough which is occasionally mildly productive.  On presentation he was having chest tightness, but no pleuritic chest pain.  His O2 sat has been increased and he is currently on high flow nasal cannula despite receiving Solu-Medrol , multiple neb treatments, azithromycin  and ceftriaxone .  Plan of care: The patient is accepted for admission to Progressive unit, at [facility].   Author: Danice Dural, MD 07/19/2023  Check [email or web address] for on-call coverage.  Nursing staff, Please call [facility] Admits & Consults System-Wide number on Amion as soon as patient's arrival, so appropriate admitting provider can evaluate the pt.

## 2023-07-19 NOTE — ED Notes (Signed)
 Blood cultures x 2 ordered and drawn after start of antibiotics

## 2023-07-19 NOTE — ED Notes (Signed)
 Sent urine to lab with a temporary sunquest label

## 2023-07-19 NOTE — H&P (Addendum)
 History and Physical    Patient: Glenn [name] DOB: 1960-08-27 DOA: 07/19/2023 DOS: the patient was seen and examined on 07/19/2023 PCP: [facility]  Patient coming from: Outside Hospital  Chief Complaint:  Chief Complaint  Patient presents with   Shortness of Breath   HPI: Glenn [name] is a 63 y.o. male with medical history significant of COPD/asthma ,sarcoidosis, hx CVA without residual deficit, received from outside ED for further management of acute hypoxic respiratory failure 2/2 CAP/COPD/Sarcoid exacerbation .  Patient reports worsening productive cough with shortness of breath for the past few weeks, with associated wheezing. No fevers, abdominal pain, nvd, chills.  He admits he is non-compliant with his prescribed O2 at home due to the machine being too bulky for he and his wife's current living arrangement.   He was initially saturating in the high 70's on room air at outside facility. In sinus tachycardia, afebrile without leukocytosis. His troponin, ABG, and lactic acid were reassuring. Currently saturating appropriately on 35% HFNC. The patient reports improvement in his symptoms since receiving steroids at outside ED. CXR with right sided infiltrate concerning for developing pneumonia.  Admitted to hospitalist service here for further management.   Review of Systems: Review of Systems  Constitutional:  Negative for chills, fever, malaise/fatigue and weight loss.  Respiratory:  Positive for cough, sputum production, shortness of breath and wheezing. Negative for hemoptysis.   Cardiovascular:  Positive for chest pain. Negative for palpitations, orthopnea and leg swelling.  Gastrointestinal:  Positive for heartburn. Negative for abdominal pain, constipation, diarrhea, nausea and vomiting.  Genitourinary:  Negative for frequency and urgency.  Musculoskeletal:  Negative for myalgias.  Neurological:  Negative for dizziness, tingling, tremors, sensory  change, focal weakness and weakness.  Psychiatric/Behavioral:  Negative for substance abuse. The patient is not nervous/anxious.     Past Medical History:  Diagnosis Date   Anxiety    Arthritis    Asthma    Chronic pain 05/06/2020   COPD (chronic obstructive pulmonary disease) (HCC)    CVA (cerebral vascular accident) (HCC)    2010; 06/2020   Depression    Diabetes mellitus type 2 in nonobese [facility])    Dyspnea    ED (erectile dysfunction)    Hyperlipidemia    Hypertension    Sarcoid    Tobacco dependence 05/06/2020   Past Surgical History:  Procedure Laterality Date   LUNG SURGERY     removed tissue   Social History:  reports that he quit smoking about 3 years ago. His smoking use included cigarettes. He started smoking about 37 years ago. He has a 34 pack-year smoking history. He has never been exposed to tobacco smoke. He has never used smokeless tobacco. He reports that he does not drink alcohol and does not use drugs.  Allergies  Allergen Reactions   Insulins Swelling    04/2020. Do not use Novolog  insulin . Swelling of tongue   Ibuprofen Other (See Comments)    Abdominal sensitivity   Pork-Derived Products Other (See Comments)    Cultural-Muslim   Shrimp [Shellfish Allergy ] Itching   Tylenol  [Acetaminophen ] Nausea Only    Abdominal sensitvity   Lisinopril Swelling and Rash    Tongue swelling    Family History  Problem Relation Age of Onset   Hypertension Mother    Diabetes Maternal Grandmother    Colon cancer Neg Hx    Esophageal cancer Neg Hx    Rectal cancer Neg Hx    Inflammatory bowel disease Neg  Hx    Liver disease Neg Hx    Pancreatic cancer Neg Hx     Prior to Admission medications   Medication Sig Start Date End Date Taking? Authorizing Provider  albuterol  (PROVENTIL ) (2.5 MG/3ML) 0.083% nebulizer solution Take 3 mLs (2.5 mg total) by nebulization every 4 (four) hours as needed for wheezing or shortness of breath. 01/26/23 01/26/24  Sandi Crosby,  PA-C  albuterol  (VENTOLIN  HFA) 108 (90 Base) MCG/ACT inhaler Inhale 1-2 puffs into the lungs every 6 (six) hours as needed for wheezing or shortness of breath. 12/17/22 01/16/23  Afton Horse T, DO  AMITIZA  24 MCG capsule Take 24 mcg by mouth daily as needed for constipation. 03/31/20   [provider]  amLODipine  (NORVASC ) 10 MG tablet Take 1 tablet (10 mg total) by mouth daily. 10/03/21   Rai, Hurman Maiden, MD  Ascorbic Acid  (VITAMIN C PO) Take 2 tablets by mouth daily.    [provider]  atorvastatin  (LIPITOR) 40 MG tablet Take 1 tablet (40 mg total) by mouth daily. 06/26/20   Pokhrel, Laxman, MD  budesonide -formoterol  (SYMBICORT ) 160-4.5 MCG/ACT inhaler Inhale 2 puffs into the lungs 2 (two) times daily. 12/29/22   Diamond Formica, MD  clopidogrel  (PLAVIX ) 75 MG tablet Take 75 mg by mouth daily. 07/06/21   [provider]  Dextromethorphan  HBr 15 MG TABS 30 mg orally every 6 to 8 hours; MAX, 120 mg/day 01/15/22   Harris, Abigail, PA-C  famotidine  (PEPCID ) 20 MG tablet One after supper Patient taking differently: Take 20 mg by mouth daily. 11/22/21   Diamond Formica, MD  fluticasone  (FLONASE ) 50 MCG/ACT nasal spray Place 2 sprays into both nostrils daily. 11/19/21   Ghimire, Estil Heman, MD  gabapentin  (NEURONTIN ) 300 MG capsule Take 1 capsule (300 mg total) by mouth 3 (three) times daily. Patient taking differently: Take 600 mg by mouth daily. 02/09/18   Stephany Ehrich, MD  guaiFENesin  (MUCINEX ) 600 MG 12 hr tablet Take 2 tablets (1,200 mg total) by mouth 2 (two) times daily. 10/13/22   Small, Brooke L, PA  HYDROcodone  bit-homatropine (HYCODAN) 5-1.5 MG/5ML syrup Take 5 mLs by mouth every 6 (six) hours as needed for cough. 05/19/23   Hershel Los, MD  ipratropium-albuterol  (DUONEB) 0.5-2.5 (3) MG/3ML SOLN Take 3 mLs by nebulization every 4 (four) hours as needed. 03/25/23   Mesner, Reymundo Caulk, MD  loratadine  (CLARITIN ) 10 MG tablet Take 1 tablet (10 mg total) by mouth daily.  01/03/22     losartan -hydrochlorothiazide  (HYZAAR) 100-12.5 MG tablet Take 1 tablet by mouth daily. 09/03/21   [provider]  metFORMIN  (GLUCOPHAGE ) 500 MG tablet Take 1 tablet (500 mg total) by mouth 2 (two) times daily with a meal. 10/03/21   Rai, Ripudeep K, MD  naloxone  (NARCAN ) nasal spray 4 mg/0.1 mL SMARTSIG:Both Nares 08/20/21   [provider]  nitroGLYCERIN  (NITROSTAT ) 0.4 MG SL tablet Place 0.4 mg under the tongue every 5 (five) minutes as needed for chest pain.    [provider]  Oxycodone  HCl 20 MG TABS Take 20 mg by mouth 4 (four) times daily. 03/31/20   [provider]  pantoprazole  (PROTONIX ) 40 MG tablet Take 1 tablet (40 mg total) by mouth daily. 07/02/19   Elgergawy, Ardia Kraft, MD  predniSONE  (DELTASONE ) 20 MG tablet 2 tabs po daily x 4 days 05/19/23   Hershel Los, MD  sildenafil  (VIAGRA ) 100 MG tablet Take 1 tablet (100 mg total) by mouth daily as needed. 01/20/23  tamsulosin  (FLOMAX ) 0.4 MG CAPS capsule Take 0.4 mg by mouth daily. 04/26/21   [provider]  Tiotropium Bromide  Monohydrate 2.5 MCG/ACT AERS Inhale 2 each into the lungs daily.    [provider]  Tiotropium Bromide -Olodaterol (STIOLTO RESPIMAT ) 2.5-2.5 MCG/ACT AERS Inhale 2 puffs into the lungs daily. 01/18/22   Diamond Formica, MD  Vitamin D , Ergocalciferol , (DRISDOL ) 1.25 MG (50000 UNIT) CAPS capsule Take 50,000 Units by mouth once a week. Mondays 05/02/19   [provider]  XTAMPZA  ER 9 MG C12A Take 9 mg by mouth 2 (two) times daily. 04/09/21   [provider]    Physical Exam: Vitals:   07/19/23 1500 07/19/23 1544 07/19/23 1638 07/19/23 1838  BP: (!) 162/105 (!) 157/99    Pulse: (!) 107 (!) 106    Resp: (!) 24 20    Temp: 98.4 F (36.9 C)   98.3 F (36.8 C)  TempSrc: Oral   Oral  SpO2: 100%  98% 95%  Weight: 65.8 kg     Height: 5' 5 (1.651 m)     Physical Exam Vitals and nursing note reviewed.  Constitutional:      General: He  is not in acute distress.    Appearance: He is not ill-appearing or diaphoretic.     Interventions: He is not intubated. HENT:     Head: Normocephalic and atraumatic.  Eyes:     Extraocular Movements: Extraocular movements intact.     Pupils: Pupils are equal, round, and reactive to light.  Cardiovascular:     Rate and Rhythm: Regular rhythm. Tachycardia present.  Pulmonary:     Effort: Pulmonary effort is normal. No tachypnea, accessory muscle usage or respiratory distress. He is not intubated.     Breath sounds: Examination of the right-lower field reveals wheezing. Examination of the left-lower field reveals wheezing. Wheezing and rhonchi present. No rales.  Chest:     Chest wall: No tenderness.  Abdominal:     Palpations: Abdomen is soft.  Musculoskeletal:     Cervical back: Normal range of motion and neck supple.     Right lower leg: No tenderness. No edema.     Left lower leg: No edema.  Skin:    General: Skin is warm and dry.  Neurological:     General: No focal deficit present.     Mental Status: He is alert and oriented to person, place, and time.  Psychiatric:        Mood and Affect: Mood normal.        Behavior: Behavior normal.     Data Reviewed:    Labs on Admission: I have personally reviewed following labs and imaging studies  CBC: Recent Labs  Lab 07/19/23 0954 07/19/23 1015  WBC 9.4  --   HGB 12.7* 13.3  HCT 39.2 39.0  MCV 83.6  --   PLT 355  --    Basic Metabolic Panel: Recent Labs  Lab 07/19/23 0954 07/19/23 1015  NA 134* 132*  K 3.9 4.8  CL 95*  --   CO2 27  --   GLUCOSE 120*  --   BUN 12  --   CREATININE 0.76  --   CALCIUM  9.0  --    GFR: Estimated Creatinine Clearance: 82.2 mL/min (by C-G formula based on SCr of 0.76 mg/dL). Liver Function Tests: No results for input(s): AST, ALT, ALKPHOS, BILITOT, PROT, ALBUMIN in the last 168 hours. No results for input(s): LIPASE, AMYLASE in the last 168 hours.  No results for  input(s): AMMONIA in the last 168 hours. Coagulation Profile: No results for input(s): INR, PROTIME in the last 168 hours. Cardiac Enzymes: No results for input(s): CKTOTAL, CKMB, CKMBINDEX, TROPONINI in the last 168 hours. BNP (last 3 results) No results for input(s): PROBNP in the last 8760 hours. HbA1C: No results for input(s): HGBA1C in the last 72 hours. CBG: No results for input(s): GLUCAP in the last 168 hours. Lipid Profile: No results for input(s): CHOL, HDL, LDLCALC, TRIG, CHOLHDL, LDLDIRECT in the last 72 hours. Thyroid Function Tests: No results for input(s): TSH, T4TOTAL, FREET4, T3FREE, THYROIDAB in the last 72 hours. Anemia Panel: No results for input(s): VITAMINB12, FOLATE, FERRITIN, TIBC, IRON, RETICCTPCT in the last 72 hours. Urine analysis:    Component Value Date/Time   COLORURINE YELLOW 01/15/2022 0509   APPEARANCEUR CLOUDY (A) 01/15/2022 0509   LABSPEC 1.015 01/15/2022 0509   PHURINE 7.0 01/15/2022 0509   GLUCOSEU NEGATIVE 01/15/2022 0509   HGBUR NEGATIVE 01/15/2022 0509   BILIRUBINUR NEGATIVE 01/15/2022 0509   KETONESUR NEGATIVE 01/15/2022 0509   PROTEINUR NEGATIVE 01/15/2022 0509   NITRITE NEGATIVE 01/15/2022 0509   LEUKOCYTESUR NEGATIVE 01/15/2022 0509    Radiological Exams on Admission: DG Chest Port 1 View Result Date: 07/19/2023 CLINICAL DATA:  Shortness of breath. EXAM: PORTABLE CHEST 1 VIEW COMPARISON:  Chest radiograph dated 05/19/2023. FINDINGS: Right perihilar density, new since the prior radiograph concerning for developing infiltrate. No pleural effusion pneumothorax. Stable cardiac silhouette. No acute osseous pathology. IMPRESSION: Right perihilar density concerning for developing infiltrate. Electronically Signed   By: Angus Bark M.D.   On: 07/19/2023 10:51       Assessment and Plan: No notes have been filed under this hospital service. Service: Hospitalist   63 y.o. male  with medical history significant of COPD/asthma ,sarcoidosis, non-compliant with  home O2,  hx CVA without residual deficit, received from outside ED for further management of acute hypoxic respiratory failure 2/2 CAP/COPD/Sarcoid exacerbation .   Acute hypoxic respiratory failure  Right sided pneumonia  COPD exacerbation Hx Sarcoidosis  - currently on 35% HFNC, wean as tolerated to maintain saturations >90%. Has baseline O2 but unsure how many liters he is supposed to be on as he does not use at home   - rocephin , azithromycin   - sputum and blood cultures pending. Check viral culture.  - budesonide , duonebs, solumedrol, PRN cough suppressant   - consider CTA to r/o PE if sinus tachycardia does not improve with fluid/resuming his chronic pain medications.   HTN HLD - losartan  and amlodipine  continued. Patient does not like to take hydrochlorothiazide  at night   Chronic opioid use  - home medications resumed. PDMP reviewed.   Hx CVA  - plavix  and statin     Monitor/replace electrolytes  SCDs DVT ppx (pork allergy )  Reg Diet  LR@40   Advance Care Planning:   Code Status: Full Code  discussed with patient at time of admission      Severity of Illness: The appropriate patient status for this patient is INPATIENT. Inpatient status is judged to be reasonable and necessary in order to provide the required intensity of service to ensure the patient's safety. The patient's presenting symptoms, physical exam findings, and initial radiographic and laboratory data in the context of their chronic comorbidities is felt to place them at high risk for further clinical deterioration. Furthermore, it is not anticipated that the patient will be medically stable for discharge from the hospital within 2 midnights of admission.   *  I certify that at the point of admission it is my clinical judgment that the patient will require inpatient hospital care spanning beyond 2 midnights from the point of  admission due to high intensity of service, high risk for further deterioration and high frequency of surveillance required.*  Author: Charlesetta Connors, DO 07/19/2023 7:20 PM  For on call review [email or web address].

## 2023-07-19 NOTE — ED Notes (Signed)
 Patient pulse ox dropping into mid 80's  has become labour ed breathing and struggling. Respiratory in to assess.  Dr. Charlee Conine notified

## 2023-07-19 NOTE — ED Notes (Signed)
 [facility] at bedside

## 2023-07-19 NOTE — ED Notes (Signed)
 RT Note: patient oxygen  saturation will decrease when he is talking on the phone

## 2023-07-19 NOTE — ED Provider Notes (Signed)
 [place] EMERGENCY DEPARTMENT AT [facility] Provider Note   CSN: [id number] Arrival date & time: 07/19/23  0940     History  Chief Complaint  Patient presents with   Shortness of Breath    Glenn [name] is a 63 y.o. male.  63 year old male with past medical history of COPD and sarcoidosis presenting to the emergency department today with shortness of breath.  The patient states he has been having persistent cough and wheezing now over the past few weeks.  He was much more short of breath this morning which is why came to the ER for further evaluation.  He denies any fevers.  Reports that his cough is minimally productive.  He states that he is having some chest tightness but denies any pleuritic chest pain.  He came to the ER today for further evaluation due to these ongoing symptoms.  He normally does not wear oxygen  at home but was satting 78% on room air on arrival.   Shortness of Breath Associated symptoms: cough and wheezing        Home Medications Prior to Admission medications   Medication Sig Start Date End Date Taking? Authorizing Provider  albuterol  (PROVENTIL ) (2.5 MG/3ML) 0.083% nebulizer solution Take 3 mLs (2.5 mg total) by nebulization every 4 (four) hours as needed for wheezing or shortness of breath. 01/26/23 01/26/24 Yes Sandi Crosby, PA-C  albuterol  (VENTOLIN  HFA) 108 (90 Base) MCG/ACT inhaler Inhale 1-2 puffs into the lungs every 6 (six) hours as needed for wheezing or shortness of breath. 12/17/22 07/20/23 Yes Afton Horse T, DO  aspirin  EC 81 MG tablet Take 81 mg by mouth daily. Swallow whole.   Yes [provider]  atorvastatin  (LIPITOR) 40 MG tablet Take 1 tablet (40 mg total) by mouth daily. 06/26/20  Yes Pokhrel, Laxman, MD  budesonide -formoterol  (SYMBICORT ) 160-4.5 MCG/ACT inhaler Inhale 2 puffs into the lungs 2 (two) times daily. 12/29/22  Yes Diamond Formica, MD  clopidogrel  (PLAVIX ) 75 MG tablet Take 75 mg by mouth daily as  needed (headache - takes in place of aspirin .). 07/06/21  Yes [provider]  famotidine  (PEPCID ) 20 MG tablet One after supper Patient taking differently: Take 20 mg by mouth daily as needed for heartburn or indigestion. 11/22/21  Yes Diamond Formica, MD  fluticasone  (FLONASE ) 50 MCG/ACT nasal spray Place 2 sprays into both nostrils daily. Patient taking differently: Place 2 sprays into both nostrils daily as needed for allergies or rhinitis. 11/19/21  Yes Ghimire, Estil Heman, MD  guaiFENesin  (MUCINEX ) 600 MG 12 hr tablet Take 2 tablets (1,200 mg total) by mouth 2 (two) times daily. 10/13/22  Yes Small, Brooke L, PA  HYDROcodone  bit-homatropine (HYCODAN) 5-1.5 MG/5ML syrup Take 5 mLs by mouth every 6 (six) hours as needed for cough. 05/19/23  Yes Hershel Los, MD  hydrOXYzine  (ATARAX ) 25 MG tablet Take 25 mg by mouth at bedtime.   Yes [provider]  ipratropium-albuterol  (DUONEB) 0.5-2.5 (3) MG/3ML SOLN Take 3 mLs by nebulization every 4 (four) hours as needed. Patient taking differently: Take 3 mLs by nebulization every 4 (four) hours as needed (shortness of breath/wheezing). 03/25/23  Yes Mesner, Reymundo Caulk, MD  loratadine  (CLARITIN ) 10 MG tablet Take 1 tablet (10 mg total) by mouth daily. 01/03/22  Yes   losartan -hydrochlorothiazide  (HYZAAR) 100-12.5 MG tablet Take 1 tablet by mouth daily. 09/03/21  Yes [provider]  metFORMIN  (GLUCOPHAGE ) 500 MG tablet Take 1 tablet (500 mg total) by mouth 2 (two) times daily  with a meal. Patient taking differently: Take 500 mg by mouth daily as needed (high blood sugar). 10/03/21  Yes Rai, Ripudeep K, MD  naloxone  (NARCAN ) nasal spray 4 mg/0.1 mL Place 1 spray into the nose as needed (opiod overdose - respiratory distress). 08/20/21  Yes [provider]  nitroGLYCERIN  (NITROSTAT ) 0.4 MG SL tablet Place 0.4 mg under the tongue every 5 (five) minutes as needed for chest pain.   Yes [provider]  Oxycodone  HCl 10 MG TABS  Take 10 mg by mouth in the morning and at bedtime. 03/31/20  Yes [provider]  pantoprazole  (PROTONIX ) 40 MG tablet Take 1 tablet (40 mg total) by mouth daily. 07/02/19  Yes Elgergawy, Ardia Kraft, MD  prazosin (MINIPRESS) 1 MG capsule Take 1 mg by mouth at bedtime.   Yes [provider]  sildenafil  (VIAGRA ) 100 MG tablet Take 1 tablet (100 mg total) by mouth daily as needed. Patient taking differently: Take 100 mg by mouth daily as needed for erectile dysfunction. 01/20/23  Yes   tamsulosin  (FLOMAX ) 0.4 MG CAPS capsule Take 0.4 mg by mouth daily. 04/26/21  Yes [provider]  Tiotropium Bromide  Monohydrate 2.5 MCG/ACT AERS Inhale 2 each into the lungs daily.   Yes [provider]  Vitamin D , Ergocalciferol , (DRISDOL ) 1.25 MG (50000 UNIT) CAPS capsule Take 50,000 Units by mouth once a week. Mondays 05/02/19  Yes [provider]  amLODipine  (NORVASC ) 10 MG tablet Take 1 tablet (10 mg total) by mouth daily. 10/03/21   Rai, Hurman Maiden, MD  Ascorbic Acid  (VITAMIN C PO) Take 2 tablets by mouth daily. Patient not taking: Reported on 07/20/2023    [provider]  gabapentin  (NEURONTIN ) 300 MG capsule Take 1 capsule (300 mg total) by mouth 3 (three) times daily. Patient not taking: Reported on 07/20/2023 02/09/18   Stephany Ehrich, MD      Allergies    Insulins, Ibuprofen, Lisinopril, Shrimp [shellfish allergy ], Tylenol  [acetaminophen ], and Pork-derived products    Review of Systems   Review of Systems  Respiratory:  Positive for cough, shortness of breath and wheezing.   All other systems reviewed and are negative.   Physical Exam Updated Vital Signs BP (!) 154/80   Pulse 99   Temp 97.6 F (36.4 C)   Resp 20   Ht 5' 5 (1.651 m)   Wt 65.8 kg   SpO2 99%   BMI 24.13 kg/m  Physical Exam Vitals and nursing note reviewed.   Gen: Tachypneic with conversational dyspnea noted Eyes: PERRL, EOMI HEENT: no oropharyngeal swelling Neck: trachea  midline Resp: Diminished with diffuse wheezes throughout all lung fields Card: RRR, no murmurs, rubs, or gallops Abd: nontender, nondistended Extremities: no calf tenderness, no edema Vascular: 2+ radial pulses bilaterally, 2+ DP pulses bilaterally Skin: no rashes Psyc: acting appropriately   ED Results / Procedures / Treatments   Labs (all labs ordered are listed, but only abnormal results are displayed) Labs Reviewed  BASIC METABOLIC PANEL WITH GFR - Abnormal; Notable for the following components:      Result Value   Sodium 134 (*)    Chloride 95 (*)    Glucose, Bld 120 (*)    All other components within normal limits  CBC - Abnormal; Notable for the following components:   Hemoglobin 12.7 (*)    All other components within normal limits  BASIC METABOLIC PANEL WITH GFR - Abnormal; Notable for the following components:   Sodium 132 (*)  Chloride 96 (*)    Glucose, Bld 187 (*)    Creatinine, Ser 0.52 (*)    Calcium  8.3 (*)    All other components within normal limits  CBC - Abnormal; Notable for the following components:   Hemoglobin 11.5 (*)    HCT 37.3 (*)    All other components within normal limits  D-DIMER, QUANTITATIVE - Abnormal; Notable for the following components:   D-Dimer, Quant 0.71 (*)    All other components within normal limits  I-STAT VENOUS BLOOD GAS, ED - Abnormal; Notable for the following components:   pH, Ven 7.449 (*)    pO2, Ven 64 (*)    Bicarbonate 32.8 (*)    TCO2 34 (*)    Acid-Base Excess 8.0 (*)    Sodium 132 (*)    Calcium , Ion 1.05 (*)    All other components within normal limits  CULTURE, BLOOD (ROUTINE X 2)  CULTURE, BLOOD (ROUTINE X 2)  SARS CORONAVIRUS 2 BY RT PCR  EXPECTORATED SPUTUM ASSESSMENT W GRAM STAIN, RFLX TO RESP C  RESPIRATORY PANEL BY PCR  LACTIC ACID, PLASMA  LACTIC ACID, PLASMA  HIV ANTIBODY (ROUTINE TESTING W REFLEX)  TSH  BRAIN NATRIURETIC PEPTIDE  PROCALCITONIN  STREP PNEUMONIAE URINARY ANTIGEN  LEGIONELLA  PNEUMOPHILA SEROGP 1 UR AG  TROPONIN T, HIGH SENSITIVITY  TROPONIN T, HIGH SENSITIVITY    EKG EKG Interpretation Date/Time:  Wednesday July 19 2023 09:51:28 EDT Ventricular Rate:  112 PR Interval:  115 QRS Duration:  80 QT Interval:  340 QTC Calculation: 465 R Axis:   31  Text Interpretation: Sinus tachycardia Probable left atrial enlargement Abnormal R-wave progression, early transition Confirmed by Abner Hoffman [phone]) on 07/19/2023 10:33:11 AM  Radiology DG Chest Port 1 View Result Date: 07/19/2023 CLINICAL DATA:  Shortness of breath. EXAM: PORTABLE CHEST 1 VIEW COMPARISON:  Chest radiograph dated 05/19/2023. FINDINGS: Right perihilar density, new since the prior radiograph concerning for developing infiltrate. No pleural effusion pneumothorax. Stable cardiac silhouette. No acute osseous pathology. IMPRESSION: Right perihilar density concerning for developing infiltrate. Electronically Signed   By: Angus Bark M.D.   On: 07/19/2023 10:51    Procedures Procedures    Medications Ordered in ED Medications  lactated ringers  bolus 1,000 mL (0 mLs Intravenous Hold 07/19/23 1204)  albuterol  (PROVENTIL ) (2.5 MG/3ML) 0.083% nebulizer solution 2.5 mg (2.5 mg Nebulization Given 07/20/23 0609)  azithromycin  (ZITHROMAX ) 500 mg in sodium chloride  0.9 % 250 mL IVPB (500 mg Intravenous New Bag/Given 07/20/23 1027)  cefTRIAXone  (ROCEPHIN ) 2 g in sodium chloride  0.9 % 100 mL IVPB (2 g Intravenous New Bag/Given 07/20/23 0927)  alum & mag hydroxide-simeth (MAALOX/MYLANTA) 200-200-20 MG/5ML suspension 15 mL (has no administration in time range)  atorvastatin  (LIPITOR) tablet 40 mg (40 mg Oral Given 07/20/23 1050)  losartan  (COZAAR ) tablet 100 mg (100 mg Oral Given 07/20/23 0910)  lubiprostone  (AMITIZA ) capsule 24 mcg (has no administration in time range)  pantoprazole  (PROTONIX ) EC tablet 40 mg (40 mg Oral Given 07/20/23 1051)  tamsulosin  (FLOMAX ) capsule 0.4 mg (0.4 mg Oral Given 07/20/23 1051)   clopidogrel  (PLAVIX ) tablet 75 mg (75 mg Oral Given 07/20/23 0910)  gabapentin  (NEURONTIN ) capsule 600 mg (600 mg Oral Given 07/20/23 1050)  guaiFENesin  (MUCINEX ) 12 hr tablet 1,200 mg (1,200 mg Oral Given 07/20/23 0910)  budesonide  (PULMICORT ) nebulizer solution 0.25 mg (0.25 mg Nebulization Given 07/20/23 0807)  ipratropium-albuterol  (DUONEB) 0.5-2.5 (3) MG/3ML nebulizer solution 3 mL (3 mLs Nebulization Given 07/20/23 1425)  HYDROcodone  bit-homatropine (HYCODAN) 5-1.5  MG/5ML syrup 5 mL (5 mLs Oral Given 07/20/23 1051)  loratadine  (CLARITIN ) tablet 10 mg (10 mg Oral Given 07/20/23 0910)  ondansetron  (ZOFRAN ) tablet 4 mg (has no administration in time range)    Or  ondansetron  (ZOFRAN ) injection 4 mg (has no administration in time range)  lactated ringers  infusion ( Intravenous New Bag/Given 07/20/23 1031)  oxyCODONE  (Oxy IR/ROXICODONE ) immediate release tablet 20 mg (20 mg Oral Given 07/20/23 0841)  predniSONE  (DELTASONE ) tablet 40 mg (40 mg Oral Given 07/20/23 1245)  metFORMIN  (GLUCOPHAGE ) tablet 500 mg (has no administration in time range)  albuterol  (PROVENTIL ) (2.5 MG/3ML) 0.083% nebulizer solution 2.5 mg (2.5 mg Nebulization Given 07/19/23 0957)  ipratropium-albuterol  (DUONEB) 0.5-2.5 (3) MG/3ML nebulizer solution 3 mL (3 mLs Nebulization Given 07/19/23 0956)  methylPREDNISolone  sodium succinate (SOLU-MEDROL ) 125 mg/2 mL injection 125 mg (125 mg Intravenous Given 07/19/23 1028)  azithromycin  (ZITHROMAX ) 500 mg in sodium chloride  0.9 % 250 mL IVPB (0 mg Intravenous Stopped 07/19/23 1149)  ipratropium-albuterol  (DUONEB) 0.5-2.5 (3) MG/3ML nebulizer solution 3 mL (3 mLs Nebulization Given 07/19/23 1050)  lactated ringers  bolus 1,000 mL (0 mLs Intravenous Stopped 07/19/23 1219)  cefTRIAXone  (ROCEPHIN ) 2 g in sodium chloride  0.9 % 100 mL IVPB (0 g Intravenous Stopped 07/19/23 1252)  ipratropium-albuterol  (DUONEB) 0.5-2.5 (3) MG/3ML nebulizer solution 3 mL (3 mLs Nebulization Given 07/19/23 1216)    ED  Course/ Medical Decision Making/ A&P                                 Medical Decision Making 63 year old male with past medical history of COPD and sarcoidosis presenting to the emergency department today with shortness of breath.  I will further evaluate the patient here with basic labs as well as an EKG, chest x-ray, and troponin for further evaluation for ACS, pulmonary edema, pulmonary infiltrates, pneumothorax.  Patient does have significant wheezes here on exam.  Will give him DuoNebs here as well as Solu-Medrol  and azithromycin  for likely COPD exacerbation.  I will reevaluate for ultimate disposition but given the hypoxia he very well may require admission.  The patient had increased work of breathing and was initially placed on BiPAP.  He did not tolerate this and was transitioned to high flow nasal cannula with stabilization and improvement.  He is admitted to hospital service for admission.  CRITICAL CARE Performed by: Carin Charleston   Total critical care time: 36 minutes  Critical care time was exclusive of separately billable procedures and treating other patients.  Critical care was necessary to treat or prevent imminent or life-threatening deterioration.  Critical care was time spent personally by me on the following activities: development of treatment plan with patient and/or surrogate as well as nursing, discussions with consultants, evaluation of patient's response to treatment, examination of patient, obtaining history from patient or surrogate, ordering and performing treatments and interventions, ordering and review of laboratory studies, ordering and review of radiographic studies, pulse oximetry and re-evaluation of patient's condition.   Amount and/or Complexity of Data Reviewed Labs: ordered. Radiology: ordered.  Risk Prescription drug management. Decision regarding hospitalization.           Final Clinical Impression(s) / ED Diagnoses Final diagnoses:   COPD exacerbation (HCC)  Pneumonia due to infectious organism, unspecified laterality, unspecified part of lung    Rx / DC Orders ED Discharge Orders     None         Carin Charleston,  MD 07/20/23 1540

## 2023-07-20 DIAGNOSIS — J9601 Acute respiratory failure with hypoxia: Secondary | ICD-10-CM | POA: Diagnosis not present

## 2023-07-20 LAB — BASIC METABOLIC PANEL WITH GFR
Anion gap: 11 (ref 5–15)
BUN: 16 mg/dL (ref 8–23)
CO2: 25 mmol/L (ref 22–32)
Calcium: 8.3 mg/dL — ABNORMAL LOW (ref 8.9–10.3)
Chloride: 96 mmol/L — ABNORMAL LOW (ref 98–111)
Creatinine, Ser: 0.52 mg/dL — ABNORMAL LOW (ref 0.61–1.24)
GFR, Estimated: >60 mL/min (ref >60)
Glucose, Bld: 187 mg/dL — ABNORMAL HIGH (ref 70–99)
Potassium: 4.6 mmol/L (ref 3.5–5.1)
Sodium: 132 mmol/L — ABNORMAL LOW (ref 135–145)

## 2023-07-20 LAB — RESPIRATORY PANEL BY PCR

## 2023-07-20 LAB — CBC
HCT: 37.3 % — ABNORMAL LOW (ref 39.0–52.0)
Hemoglobin: 11.5 g/dL — ABNORMAL LOW (ref 13.0–17.0)
MCH: 27.1 pg (ref 26.0–34.0)
MCHC: 30.8 g/dL (ref 30.0–36.0)
MCV: 88 fL (ref 80.0–100.0)
Platelets: 336 10*3/uL (ref 150–400)
RBC: 4.24 MIL/uL (ref 4.22–5.81)
RDW: 14 % (ref 11.5–15.5)
WBC: 7.7 10*3/uL (ref 4.0–10.5)
nRBC: 0 % (ref 0.0–0.2)

## 2023-07-20 LAB — HIV ANTIBODY (ROUTINE TESTING W REFLEX): HIV Screen 4th Generation wRfx: NONREACTIVE

## 2023-07-20 LAB — STREP PNEUMONIAE URINARY ANTIGEN: Strep Pneumo Urinary Antigen: NEGATIVE

## 2023-07-20 LAB — BRAIN NATRIURETIC PEPTIDE: B Natriuretic Peptide: 76.1 pg/mL (ref 0.0–100.0)

## 2023-07-20 LAB — D-DIMER, QUANTITATIVE: D-Dimer, Quant: 0.71 ug{FEU}/mL — ABNORMAL HIGH (ref 0.00–0.50)

## 2023-07-20 LAB — PROCALCITONIN: Procalcitonin: <0.1 ng/mL

## 2023-07-20 LAB — TSH: TSH: 0.591 u[IU]/mL (ref 0.350–4.500)

## 2023-07-20 LAB — SARS CORONAVIRUS 2 BY RT PCR: SARS Coronavirus 2 by RT PCR: NEGATIVE

## 2023-07-20 MED ORDER — PREDNISONE 20 MG PO TABS
40.0000 mg | ORAL_TABLET | Freq: Every day | ORAL | Status: DC
  Administered 2023-07-20 – 2023-07-21 (×2): 40 mg via ORAL
  Filled 2023-07-20 (×2): qty 2

## 2023-07-20 MED ORDER — METFORMIN HCL 500 MG PO TABS
500.0000 mg | ORAL_TABLET | Freq: Two times a day (BID) | ORAL | Status: DC
  Administered 2023-07-20 – 2023-07-23 (×6): 500 mg via ORAL
  Filled 2023-07-20 (×6): qty 1

## 2023-07-20 NOTE — Plan of Care (Signed)
  Problem: Education: Goal: Knowledge of General Education information will improve Description Including pain rating scale, medication(s)/side effects and non-pharmacologic comfort measures Outcome: Progressing   Problem: Clinical Measurements: Goal: Diagnostic test results will improve Outcome: Progressing Goal: Respiratory complications will improve Outcome: Progressing   Problem: Coping: Goal: Level of anxiety will decrease Outcome: Progressing   

## 2023-07-20 NOTE — Progress Notes (Addendum)
 PROGRESS NOTE    Glenn [name]  NGE:[id number] DOB: [birth year] DOA: 07/19/2023 PCP: [facility]  Outpatient Specialists: pulmonology    Brief Narrative:   From admission h and p  Glenn [name] is a 63 y.o. male with medical history significant of COPD/asthma ,sarcoidosis, hx CVA without residual deficit, received from outside ED for further management of acute hypoxic respiratory failure 2/2 CAP/COPD/Sarcoid exacerbation .  Patient reports worsening productive cough with shortness of breath for the past few weeks, with associated wheezing. No fevers, abdominal pain, nvd, chills.  He admits he is non-compliant with his prescribed O2 at home due to the machine being too bulky for he and his wife's current living arrangement.    He was initially saturating in the high 70's on room air at outside facility. In sinus tachycardia, afebrile without leukocytosis. His troponin, ABG, and lactic acid were reassuring. Currently saturating appropriately on 35% HFNC. The patient reports improvement in his symptoms since receiving steroids at outside ED. CXR with right sided infiltrate concerning for developing pneumonia.  Admitted to hospitalist service here for further management.    Assessment & Plan:   Principal Problem:   Acute respiratory failure with hypoxia (HCC) Active Problems:   COPD with acute exacerbation (HCC)   Diabetes mellitus type 2 in nonobese (HCC)   Hyperlipidemia   Personal history of sarcoidosis   History of CVA (cerebrovascular accident)   CAP (community acquired pneumonia)  # COPD with acute exacerbation # Possible CAP Several days worsening cough and dyspnea. Perihilar opacity on cxr though no fever or leukocytosis. Does have productive cough. Improving with below treatments. No respiratory panels have been ordered. Hiv negative. No chest pain, troponin negative. Not an active smoker. - continue steroids (will switch methylpred to oral prednisone ) - continue  ceftriaxone /azithromycin  - sputum for culture - check respiratory panels - urine antigens - f/u dimer, bnp, procal  # Acute hypoxic respiratory failure # Possible chronic hypoxic respiratory failure Breathing comfortably but on hfnc. Has been on o2 in the past, not currently, may need this  # HTN BP is soft - hold home amlodipine   # T2DM Apparently allergic to insulin  - continue home metformin  - monitor glucose, may need to escalate given corticosteroids  # Hx CVA - cont home plavix , statin  # Chronic pain on opioids - home oxy  # BPH - home flomax    DVT prophylaxis: SCDs (pork allergy ) Code Status: full Family Communication: none at bedside  Level of care: Progressive Status is: Inpatient Remains inpatient appropriate because: severity of illness    Consultants:  none  Procedures: none  Antimicrobials:  See above    Subjective: Reports significant improvement in dyspnea  Objective: Vitals:   07/19/23 2200 07/20/23 0000 07/20/23 0552 07/20/23 0807  BP:  (!) 151/110 (!) 151/94   Pulse:  99    Resp:  (!) 23    Temp: 97.7 F (36.5 C)  97.7 F (36.5 C)   TempSrc: Oral  Oral   SpO2:  96%  97%  Weight:      Height:        Intake/Output Summary (Last 24 hours) at 07/20/2023 0907 Last data filed at 07/20/2023 0848 Gross per 24 hour  Intake 326.68 ml  Output 1170 ml  Net -843.32 ml   Filed Weights   07/19/23 1139 07/19/23 1500  Weight: 65.8 kg 65.8 kg    Examination:  General exam: Appears calm and comfortable  Respiratory system: decreased in left base otherwise  surprisingly clear Cardiovascular system: S1 & S2 heard, RRR.   Gastrointestinal system: Abdomen is nondistended, soft and nontender.   Central nervous system: Alert and oriented. No focal neurological deficits. Extremities: Symmetric 5 x 5 power. Skin: No rashes, lesions or ulcers Psychiatry: Judgement and insight appear normal. Mood & affect appropriate.     Data Reviewed: I  have personally reviewed following labs and imaging studies  CBC: Recent Labs  Lab 07/19/23 0954 07/19/23 1015 07/20/23 0356  WBC 9.4  --  7.7  HGB 12.7* 13.3 11.5*  HCT 39.2 39.0 37.3*  MCV 83.6  --  88.0  PLT 355  --  336   Basic Metabolic Panel: Recent Labs  Lab 07/19/23 0954 07/19/23 1015 07/20/23 0356  NA 134* 132* 132*  K 3.9 4.8 4.6  CL 95*  --  96*  CO2 27  --  25  GLUCOSE 120*  --  187*  BUN 12  --  16  CREATININE 0.76  --  0.52*  CALCIUM  9.0  --  8.3*   GFR: Estimated Creatinine Clearance: 82.2 mL/min (A) (by C-G formula based on SCr of 0.52 mg/dL (L)). Liver Function Tests: No results for input(s): AST, ALT, ALKPHOS, BILITOT, PROT, ALBUMIN in the last 168 hours. No results for input(s): LIPASE, AMYLASE in the last 168 hours. No results for input(s): AMMONIA in the last 168 hours. Coagulation Profile: No results for input(s): INR, PROTIME in the last 168 hours. Cardiac Enzymes: No results for input(s): CKTOTAL, CKMB, CKMBINDEX, TROPONINI in the last 168 hours. BNP (last 3 results) No results for input(s): PROBNP in the last 8760 hours. HbA1C: No results for input(s): HGBA1C in the last 72 hours. CBG: No results for input(s): GLUCAP in the last 168 hours. Lipid Profile: No results for input(s): CHOL, HDL, LDLCALC, TRIG, CHOLHDL, LDLDIRECT in the last 72 hours. Thyroid Function Tests: Recent Labs    07/20/23 0356  TSH 0.591   Anemia Panel: No results for input(s): VITAMINB12, FOLATE, FERRITIN, TIBC, IRON, RETICCTPCT in the last 72 hours. Urine analysis:    Component Value Date/Time   COLORURINE YELLOW 01/15/2022 0509   APPEARANCEUR CLOUDY (A) 01/15/2022 0509   LABSPEC 1.015 01/15/2022 0509   PHURINE 7.0 01/15/2022 0509   GLUCOSEU NEGATIVE 01/15/2022 0509   HGBUR NEGATIVE 01/15/2022 0509   BILIRUBINUR NEGATIVE 01/15/2022 0509   KETONESUR NEGATIVE 01/15/2022 0509   PROTEINUR NEGATIVE  01/15/2022 0509   NITRITE NEGATIVE 01/15/2022 0509   LEUKOCYTESUR NEGATIVE 01/15/2022 0509   Sepsis Labs: @LABRCNTIP (procalcitonin:4,lacticidven:4)  ) Recent Results (from the past 240 hours)  Blood culture (routine x 2)     Status: None (Preliminary result)   Collection Time: 07/19/23 10:56 AM   Specimen: BLOOD LEFT ARM  Result Value Ref Range Status   Specimen Description   Final    BLOOD LEFT ARM Performed at [facility], [address], [place], [place] [address]    Special Requests   Final    BOTTLES DRAWN AEROBIC AND ANAEROBIC Blood Culture adequate volume Performed at [facility], [address], [place], [place] [address]    Culture   Final    NO GROWTH < 24 HOURS Performed at [facility], [address], [place], [place] [address]    Report Status PENDING  Incomplete  Blood culture (routine x 2)     Status: None (Preliminary result)   Collection Time: 07/19/23 11:01 AM   Specimen: BLOOD RIGHT ARM  Result Value Ref Range Status  Specimen Description   Final    BLOOD RIGHT ARM Performed at [facility], [address], [place], [place] [address]    Special Requests   Final    BOTTLES DRAWN AEROBIC ONLY Blood Culture adequate volume Performed at [facility], [address] [place], [place] [address]    Culture   Final    NO GROWTH < 24 HOURS Performed at [facility], [address], [place], [place] [address]    Report Status PENDING  Incomplete         Radiology Studies: DG Chest Port 1 View Result Date: 07/19/2023 CLINICAL DATA:  Shortness of breath. EXAM: PORTABLE CHEST 1 VIEW COMPARISON:  Chest radiograph dated 05/19/2023. FINDINGS: Right perihilar density, new since the prior radiograph concerning for developing infiltrate. No pleural effusion pneumothorax. Stable cardiac silhouette. No acute osseous pathology. IMPRESSION: Right perihilar density concerning for developing infiltrate.  Electronically Signed   By: Angus Bark M.D.   On: 07/19/2023 10:51        Scheduled Meds:  amLODipine   10 mg Oral Daily   atorvastatin   40 mg Oral Daily   budesonide  (PULMICORT ) nebulizer solution  0.25 mg Nebulization BID   clopidogrel   75 mg Oral Daily   gabapentin   600 mg Oral Daily   guaiFENesin   1,200 mg Oral BID   ipratropium-albuterol   3 mL Nebulization Q6H   loratadine   10 mg Oral Daily   losartan   100 mg Oral Daily   methylPREDNISolone  (SOLU-MEDROL ) injection  40 mg Intravenous Q24H   pantoprazole   40 mg Oral Daily   tamsulosin   0.4 mg Oral Daily   Continuous Infusions:  azithromycin  (ZITHROMAX ) 500 mg in sodium chloride  0.9 % 250 mL IVPB     cefTRIAXone  (ROCEPHIN )  IV     lactated ringers  Stopped (07/19/23 1204)   lactated ringers  40 mL/hr at 07/19/23 2149     LOS: 1 day     Raymonde Calico, MD [facility]   If 7PM-7AM, please contact night-coverage [email or web address] Password [facility] 07/20/2023, 9:07 AM

## 2023-07-20 NOTE — TOC Initial Note (Signed)
 Transition of Care [facility]) - Initial/Assessment Note   Patient Details  Name: Glenn [name] MRN: [id number] Date of Birth: [birth year]  Transition of Care [facility]) CM/SW Contact:    Zenon Hilda, LCSW Phone Number: 07/20/2023, 9:40 AM  Clinical Narrative: Patient is from home with spouse. Patient is currently requiring 40 HHFNC at this time. TOC to follow for possible home oxygen  needs.  Expected Discharge Plan: Home/Self Care Barriers to Discharge: Continued Medical Work up  Expected Discharge Plan and Services In-house Referral: Clinical Social Work Living arrangements for the past 2 months: Apartment  Prior Living Arrangements/Services Living arrangements for the past 2 months: Apartment Lives with:: Spouse Patient language and need for interpreter reviewed:: Yes Do you feel safe going back to the place where you live?: Yes      Need for Family Participation in Patient Care: No (Comment) Care giver support system in place?: Yes (comment) Current home services: DME ([employer]) Criminal Activity/Legal Involvement Pertinent to Current Situation/Hospitalization: No - Comment as needed  Activities of Daily Living ADL Screening (condition at time of admission) Independently performs ADLs?: Yes (appropriate for developmental age) Is the patient deaf or have difficulty hearing?: No Does the patient have difficulty seeing, even when wearing glasses/contacts?: No Does the patient have difficulty concentrating, remembering, or making decisions?: No  Emotional Assessment Orientation: : Oriented to Self, Oriented to Place, Oriented to  Time, Oriented to Situation Alcohol / Substance Use: Not Applicable Psych Involvement: No (comment)  Admission diagnosis:  COPD exacerbation (HCC) [J44.1] Acute respiratory failure with hypoxia (HCC) [J96.01] Pneumonia due to infectious organism, unspecified laterality, unspecified part of lung [J18.9] Acute hypoxic respiratory failure (HCC)  [J96.01] Patient Active Problem List   Diagnosis Date Noted   Acute hypoxic respiratory failure (HCC) 07/19/2023   Viral pneumonia 01/11/2022   Influenza A 01/10/2022   CAP (community acquired pneumonia) 01/10/2022   BPH (benign prostatic hyperplasia) 01/10/2022   Sore throat 11/22/2021   Acute respiratory failure with hypoxia (HCC) 10/01/2021   History of CVA (cerebrovascular accident) 09/30/2021   Hyperlipidemia 09/30/2021   Personal history of sarcoidosis 09/30/2021   Asthma exacerbation 05/23/2021   Acute CVA (cerebrovascular accident) (HCC) 06/23/2020   Daytime sleepiness 06/09/2020   Malnutrition of moderate degree 05/07/2020   Chronic pain 05/06/2020   Tobacco dependence 05/06/2020   Hypertension    Diabetes mellitus type 2 in nonobese [facility])    COPD with acute exacerbation (HCC) 06/30/2019   Near syncope 06/30/2019   Pleural nodule 06/30/2019   Colon cancer screening 12/18/2017   History of colonic polyps 12/18/2017   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   COPD GOLD  II 10/05/2016   Tinnitus of left ear 09/13/2016   PCP:  Clinic, [facility] Pharmacy:   [id number], [place] - [address] AT [facility] [address] [place] [address] Phone: [phone] Fax: [phone]  [id number] [place]ette Nestle, [place] - [address] AT [facility] [address] [place] [address] Phone: [phone] Fax: [phone]  [facility] - [place], [place] - [address] [facility] [address] [place] [address] Phone: [phone] Fax: [phone]  Social Drivers of Health (SDOH) Social History: SDOH Screenings   Food Insecurity: No Food Insecurity (07/19/2023)  Housing: Low Risk  (07/19/2023)  Transportation Needs: No Transportation Needs (07/19/2023)  Utilities: Not At Risk (07/19/2023)  Depression (PHQ2-9): Low  Risk  (12/20/2021)  Social  Connections: Patient Declined (07/19/2023)  Tobacco Use: Medium Risk (07/19/2023)   SDOH Interventions:    Readmission Risk Interventions    07/20/2023    9:38 AM 01/13/2022    8:19 AM 01/12/2022    3:06 PM  Readmission Risk Prevention Plan  Transportation Screening Complete Complete Complete  PCP or Specialist Appt within 5-7 Days   Complete  PCP or Specialist Appt within 3-5 Days  Complete   Home Care Screening Complete  Complete  Medication Review (RN CM) Complete  Complete  HRI or Home Care Consult  Complete   Social Work Consult for Recovery Care Planning/Counseling  Complete   Palliative Care Screening  Not Applicable   Medication Review [employer])  Complete

## 2023-07-21 LAB — CBC
HCT: 36.4 % — ABNORMAL LOW (ref 39.0–52.0)
Hemoglobin: 11.1 g/dL — ABNORMAL LOW (ref 13.0–17.0)
MCH: 27.5 pg (ref 26.0–34.0)
MCHC: 30.5 g/dL (ref 30.0–36.0)
MCV: 90.3 fL (ref 80.0–100.0)
Platelets: 357 10*3/uL (ref 150–400)
RBC: 4.03 MIL/uL — ABNORMAL LOW (ref 4.22–5.81)
RDW: 13.9 % (ref 11.5–15.5)
WBC: 10.4 10*3/uL (ref 4.0–10.5)
nRBC: 0 % (ref 0.0–0.2)

## 2023-07-21 LAB — BASIC METABOLIC PANEL WITH GFR
Anion gap: 7 (ref 5–15)
BUN: 16 mg/dL (ref 8–23)
CO2: 31 mmol/L (ref 22–32)
Calcium: 8.1 mg/dL — ABNORMAL LOW (ref 8.9–10.3)
Chloride: 95 mmol/L — ABNORMAL LOW (ref 98–111)
Creatinine, Ser: 0.76 mg/dL (ref 0.61–1.24)
GFR, Estimated: >60 mL/min (ref >60)
Glucose, Bld: 218 mg/dL — ABNORMAL HIGH (ref 70–99)
Potassium: 4.3 mmol/L (ref 3.5–5.1)
Sodium: 133 mmol/L — ABNORMAL LOW (ref 135–145)

## 2023-07-21 LAB — D-DIMER, QUANTITATIVE: D-Dimer, Quant: 0.56 ug{FEU}/mL — ABNORMAL HIGH (ref 0.00–0.50)

## 2023-07-21 MED ORDER — PREDNISONE 20 MG PO TABS
40.0000 mg | ORAL_TABLET | Freq: Two times a day (BID) | ORAL | Status: DC
  Administered 2023-07-21 – 2023-07-23 (×4): 40 mg via ORAL
  Filled 2023-07-21 (×4): qty 2

## 2023-07-21 NOTE — Plan of Care (Signed)
  Problem: Education: Goal: Knowledge of General Education information will improve Description: Including pain rating scale, medication(s)/side effects and non-pharmacologic comfort measures Outcome: Progressing   Problem: Clinical Measurements: Goal: Diagnostic test results will improve Outcome: Progressing Goal: Respiratory complications will improve Outcome: Progressing   Problem: Nutrition: Goal: Adequate nutrition will be maintained Outcome: Progressing   Problem: Coping: Goal: Level of anxiety will decrease Outcome: Progressing   Problem: Pain Managment: Goal: General experience of comfort will improve and/or be controlled Outcome: Progressing

## 2023-07-21 NOTE — Progress Notes (Signed)
 Progress Note   Patient: Glenn [name] [id number]:[id number] DOB: October 09, 1960 DOA: 07/19/2023     2 DOS: the patient was seen and examined on 07/21/2023   Brief hospital course: Glenn [name] is a 63 y.o. male with medical history significant of COPD/asthma ,sarcoidosis, hx CVA without residual deficit, received from outside ED for further management of acute hypoxic respiratory failure 2/2 CAP/COPD/Sarcoid exacerbation .  Patient reports worsening productive cough with shortness of breath for the past few weeks, with associated wheezing. No fevers, abdominal pain, nvd, chills.  He admits he is non-compliant with his prescribed O2 at home due to the machine being too bulky for he and his wife's current living arrangement.    He was initially saturating in the high 70's on room air at outside facility. In sinus tachycardia, afebrile without leukocytosis. His troponin, ABG, and lactic acid were reassuring. Currently saturating appropriately on 35% HFNC. The patient reports improvement in his symptoms since receiving steroids at outside ED. CXR with right sided infiltrate concerning for developing pneumonia.  Admitted to hospitalist service here for further management.     Assessment and Plan: Principal Problem:   Acute respiratory failure with hypoxia (HCC) Active Problems:   COPD with acute exacerbation (HCC)   Diabetes mellitus type 2 in nonobese (HCC)   Hyperlipidemia   Personal history of sarcoidosis   History of CVA (cerebrovascular accident)   CAP (community acquired pneumonia)   # COPD with acute exacerbation # Possible CAP Several days worsening cough and dyspnea. Perihilar opacity on cxr though no fever or leukocytosis. Does have productive cough. Improving with below treatments. No respiratory panels have been ordered. Hiv negative. No chest pain, troponin negative. Not an active smoker. - continue steroids (will switch methylpred to oral prednisone  40mg  BID) - continue  ceftriaxone /azithromycin  - sputum for culture - check respiratory panels - urine antigens - Negative bnp, procal -Elevated Dimer-0.71 -Low probability for PE based on wells criteria -repeat dimer   # Acute hypoxic respiratory failure # Possible chronic hypoxic respiratory failure Breathing comfortably but on hfnc. Has been on o2 in the past, not currently, may need this   # HTN BP is soft - hold home amlodipine    # T2DM Apparently allergic to insulin  - continue home metformin  - monitor glucose, may need to escalate given corticosteroids   # Hx CVA - cont home plavix , statin   # Chronic pain on opioids - home oxy   # BPH - home flomax      DVT prophylaxis: SCDs (pork allergy ) Code Status: full Family Communication: none at bedside   Level of care: Progressive Status is: Inpatient Remains inpatient appropriate because: severity of illness       Subjective: Patient seen at bedside this morning, he has no acute complaints, he reports that his breathing has improved.  Patient is on high flow nasal cannula on 30%.   Discussed with respiratory therapist at bedside who states that she would generally wean him down. He reports reduced sputum production compared to time of admission. Physical Exam: Vitals:   07/20/23 1425 07/20/23 1939 07/21/23 0405 07/21/23 0824  BP:  (!) 137/90    Pulse:      Resp:      Temp:  97.8 F (36.6 C) 97.6 F (36.4 C)   TempSrc:  Oral Oral   SpO2: 99%   100%  Weight:      Height:      Physical Exam Constitutional:      Appearance: He is  well-developed.  HENT:     Head: Normocephalic and atraumatic.   Eyes:     Extraocular Movements: Extraocular movements intact.     Pupils: Pupils are equal, round, and reactive to light.    Cardiovascular:     Rate and Rhythm: Normal rate and regular rhythm.  Pulmonary:     Effort: Pulmonary effort is normal. Tachypnea present. No respiratory distress.     Breath sounds: Examination of the  right-lower field reveals decreased breath sounds. Examination of the left-lower field reveals decreased breath sounds. Decreased breath sounds present.     Comments: On HFNC 30%  Musculoskeletal:        General: Normal range of motion.   Skin:    General: Skin is warm and dry.     Capillary Refill: Capillary refill takes less than 2 seconds.   Neurological:     General: No focal deficit present.     Mental Status: He is alert.   Psychiatric:        Mood and Affect: Mood normal.        Behavior: Behavior normal.    No current facility-administered medications on file prior to encounter.   Current Outpatient Medications on File Prior to Encounter  Medication Sig Dispense Refill   albuterol  (PROVENTIL ) (2.5 MG/3ML) 0.083% nebulizer solution Take 3 mLs (2.5 mg total) by nebulization every 4 (four) hours as needed for wheezing or shortness of breath. 150 mL 2   albuterol  (VENTOLIN  HFA) 108 (90 Base) MCG/ACT inhaler Inhale 1-2 puffs into the lungs every 6 (six) hours as needed for wheezing or shortness of breath. 1 each 0   aspirin  EC 81 MG tablet Take 81 mg by mouth daily. Swallow whole.     atorvastatin  (LIPITOR) 40 MG tablet Take 1 tablet (40 mg total) by mouth daily. 30 tablet 2   budesonide -formoterol  (SYMBICORT ) 160-4.5 MCG/ACT inhaler Inhale 2 puffs into the lungs 2 (two) times daily. 1 each 11   clopidogrel  (PLAVIX ) 75 MG tablet Take 75 mg by mouth daily as needed (headache - takes in place of aspirin .).     famotidine  (PEPCID ) 20 MG tablet One after supper (Patient taking differently: Take 20 mg by mouth daily as needed for heartburn or indigestion.) 30 tablet 11   fluticasone  (FLONASE ) 50 MCG/ACT nasal spray Place 2 sprays into both nostrils daily. (Patient taking differently: Place 2 sprays into both nostrils daily as needed for allergies or rhinitis.) 16 g 2   guaiFENesin  (MUCINEX ) 600 MG 12 hr tablet Take 2 tablets (1,200 mg total) by mouth 2 (two) times daily. 28 tablet 0    HYDROcodone  bit-homatropine (HYCODAN) 5-1.5 MG/5ML syrup Take 5 mLs by mouth every 6 (six) hours as needed for cough. 120 mL 0   hydrOXYzine  (ATARAX ) 25 MG tablet Take 25 mg by mouth at bedtime.     ipratropium-albuterol  (DUONEB) 0.5-2.5 (3) MG/3ML SOLN Take 3 mLs by nebulization every 4 (four) hours as needed. (Patient taking differently: Take 3 mLs by nebulization every 4 (four) hours as needed (shortness of breath/wheezing).) 360 mL 1   loratadine  (CLARITIN ) 10 MG tablet Take 1 tablet (10 mg total) by mouth daily. 30 tablet 5   losartan -hydrochlorothiazide  (HYZAAR) 100-12.5 MG tablet Take 1 tablet by mouth daily.     metFORMIN  (GLUCOPHAGE ) 500 MG tablet Take 1 tablet (500 mg total) by mouth 2 (two) times daily with a meal. (Patient taking differently: Take 500 mg by mouth daily as needed (high blood sugar).) 60 tablet 3  naloxone  (NARCAN ) nasal spray 4 mg/0.1 mL Place 1 spray into the nose as needed (opiod overdose - respiratory distress).     nitroGLYCERIN  (NITROSTAT ) 0.4 MG SL tablet Place 0.4 mg under the tongue every 5 (five) minutes as needed for chest pain.     Oxycodone  HCl 10 MG TABS Take 10 mg by mouth in the morning and at bedtime.     pantoprazole  (PROTONIX ) 40 MG tablet Take 1 tablet (40 mg total) by mouth daily. 30 tablet 0   prazosin (MINIPRESS) 1 MG capsule Take 1 mg by mouth at bedtime.     sildenafil  (VIAGRA ) 100 MG tablet Take 1 tablet (100 mg total) by mouth daily as needed. (Patient taking differently: Take 100 mg by mouth daily as needed for erectile dysfunction.) 30 tablet 5   tamsulosin  (FLOMAX ) 0.4 MG CAPS capsule Take 0.4 mg by mouth daily.     Tiotropium Bromide  Monohydrate 2.5 MCG/ACT AERS Inhale 2 each into the lungs daily.     Vitamin D , Ergocalciferol , (DRISDOL ) 1.25 MG (50000 UNIT) CAPS capsule Take 50,000 Units by mouth once a week. Mondays     amLODipine  (NORVASC ) 10 MG tablet Take 1 tablet (10 mg total) by mouth daily. 30 tablet 3   Ascorbic Acid  (VITAMIN C PO)  Take 2 tablets by mouth daily. (Patient not taking: Reported on 07/20/2023)     gabapentin  (NEURONTIN ) 300 MG capsule Take 1 capsule (300 mg total) by mouth 3 (three) times daily. (Patient not taking: Reported on 07/20/2023) 50 capsule 0     Data Reviewed:  There are no new results to review at this time.  Family Communication:   Disposition: Status is: Inpatient Remains inpatient appropriate because: Acute respiratory distress  Planned Discharge Destination: Home    Time spent: 30 minutes  Author: Volney Grumbles, MD 07/21/2023 10:35 AM  For on call review [email or web address].

## 2023-07-21 NOTE — Plan of Care (Signed)
   Problem: Education: Goal: Knowledge of General Education information will improve Description Including pain rating scale, medication(s)/side effects and non-pharmacologic comfort measures Outcome: Progressing   Problem: Health Behavior/Discharge Planning: Goal: Ability to manage health-related needs will improve Outcome: Progressing

## 2023-07-21 NOTE — Plan of Care (Signed)

## 2023-07-22 LAB — BASIC METABOLIC PANEL WITH GFR
Anion gap: 6 (ref 5–15)
BUN: 15 mg/dL (ref 8–23)
CO2: 33 mmol/L — ABNORMAL HIGH (ref 22–32)
Calcium: 8.6 mg/dL — ABNORMAL LOW (ref 8.9–10.3)
Chloride: 96 mmol/L — ABNORMAL LOW (ref 98–111)
Creatinine, Ser: 0.79 mg/dL (ref 0.61–1.24)
GFR, Estimated: >60 mL/min (ref >60)
Glucose, Bld: 253 mg/dL — ABNORMAL HIGH (ref 70–99)
Potassium: 4.6 mmol/L (ref 3.5–5.1)
Sodium: 135 mmol/L (ref 135–145)

## 2023-07-22 LAB — CBC
HCT: 36.9 % — ABNORMAL LOW (ref 39.0–52.0)
Hemoglobin: 10.9 g/dL — ABNORMAL LOW (ref 13.0–17.0)
MCH: 26.5 pg (ref 26.0–34.0)
MCHC: 29.5 g/dL — ABNORMAL LOW (ref 30.0–36.0)
MCV: 89.8 fL (ref 80.0–100.0)
Platelets: 386 10*3/uL (ref 150–400)
RBC: 4.11 MIL/uL — ABNORMAL LOW (ref 4.22–5.81)
RDW: 13.9 % (ref 11.5–15.5)
WBC: 8 10*3/uL (ref 4.0–10.5)
nRBC: 0 % (ref 0.0–0.2)

## 2023-07-22 LAB — LEGIONELLA PNEUMOPHILA SEROGP 1 UR AG: L. pneumophila Serogp 1 Ur Ag: NEGATIVE

## 2023-07-22 MED ORDER — IPRATROPIUM-ALBUTEROL 0.5-2.5 (3) MG/3ML IN SOLN
3.0000 mL | Freq: Two times a day (BID) | RESPIRATORY_TRACT | Status: DC
  Administered 2023-07-22 – 2023-07-23 (×2): 3 mL via RESPIRATORY_TRACT
  Filled 2023-07-22 (×2): qty 3

## 2023-07-22 MED ORDER — AZITHROMYCIN 250 MG PO TABS
500.0000 mg | ORAL_TABLET | Freq: Every day | ORAL | Status: AC
  Administered 2023-07-22 – 2023-07-23 (×2): 500 mg via ORAL
  Filled 2023-07-22 (×2): qty 2

## 2023-07-22 NOTE — Plan of Care (Signed)

## 2023-07-22 NOTE — Progress Notes (Signed)
 Progress Note   Patient: Glenn [name] BJY:[id number] DOB: 1960/09/09 DOA: 07/19/2023     3 DOS: the patient was seen and examined on 07/22/2023   Brief hospital course: Glenn [name] is a 63 y.o. male with medical history significant of COPD/asthma ,sarcoidosis, hx CVA without residual deficit, received from outside ED for further management of acute hypoxic respiratory failure 2/2 CAP/COPD/Sarcoid exacerbation .  Patient reports worsening productive cough with shortness of breath for the past few weeks, with associated wheezing. No fevers, abdominal pain, nvd, chills.  He admits he is non-compliant with his prescribed O2 at home due to the machine being too bulky for he and his wife's current living arrangement.    He was initially saturating in the high 70's on room air at outside facility. In sinus tachycardia, afebrile without leukocytosis. His troponin, ABG, and lactic acid were reassuring. Currently saturating appropriately on 35% HFNC. The patient reports improvement in his symptoms since receiving steroids at outside ED. CXR with right sided infiltrate concerning for developing pneumonia.  Admitted to hospitalist service here for further management.   Assessment and Plan: Principal Problem:   Acute respiratory failure with hypoxia (HCC) Active Problems:   COPD with acute exacerbation (HCC)   Diabetes mellitus type 2 in nonobese (HCC)   Hyperlipidemia   Personal history of sarcoidosis   History of CVA (cerebrovascular accident)   CAP (community acquired pneumonia)   # COPD with acute exacerbation # Possible CAP Several days worsening cough and dyspnea. Perihilar opacity on cxr though no fever or leukocytosis. Does have productive cough. Improving with below treatments. No respiratory panels have been ordered. Hiv negative. No chest pain, troponin negative. Not an active smoker. - continue steroids (will switch methylpred to oral prednisone  40mg  BID) - continue  ceftriaxone /azithromycin  - sputum for culture - check respiratory panels - urine antigens - Negative bnp, procal -Elevated Dimer-0.71 -Low probability for PE based on wells criteria -repeat dimer improved 0.5   # Acute hypoxic respiratory failure # Possible chronic hypoxic respiratory failure Breathing comfortably but on hfnc. Has been on o2 in the past, not currently, may need this   # HTN BP is soft - hold home amlodipine    # T2DM Apparently allergic to insulin  - continue home metformin  - monitor glucose, may need to escalate given corticosteroids   # Hx CVA - cont home plavix , statin   # Chronic pain on opioids - home oxy   # BPH - home flomax      DVT prophylaxis: SCDs (pork allergy ) Code Status: full Family Communication: none at bedside   Level of care: Progressive Status is: Inpatient Remains inpatient appropriate because: severity of illness             Subjective: Patient seen this morning.  He has been transitioned to 2 L of nasal cannula from high flow.  He feels better this morning.  Physical Exam: Vitals:   07/22/23 0249 07/22/23 0401 07/22/23 0406 07/22/23 0858  BP:  (!) 158/101    Pulse:  (!) 106    Resp:  17 17   Temp:  97.7 F (36.5 C)    TempSrc:  Oral    SpO2: 100% 99% 100% 98%  Weight:      Height:      General exam: Appears calm and comfortable  Respiratory system: decreased in left base otherwise surprisingly clear Cardiovascular system: S1 & S2 heard, RRR.   Gastrointestinal system: Abdomen is nondistended, soft and nontender.   Central nervous  system: Alert and oriented. No focal neurological deficits. Extremities: Symmetric 5 x 5 power. Skin: No rashes, lesions or ulcers Psychiatry: Judgement and insight appear normal. Mood & affect appropriate.    Disposition: Status is: Inpatient Remains inpatient   Planned Discharge Destination: Home    Time spent: 35 minutes  Author: Volney Grumbles, MD 07/22/2023 10:41  AM  For on call review [email or web address].

## 2023-07-23 DIAGNOSIS — J9601 Acute respiratory failure with hypoxia: Secondary | ICD-10-CM | POA: Diagnosis not present

## 2023-07-23 LAB — BASIC METABOLIC PANEL WITH GFR
Anion gap: 8 (ref 5–15)
BUN: 15 mg/dL (ref 8–23)
CO2: 30 mmol/L (ref 22–32)
Calcium: 8.9 mg/dL (ref 8.9–10.3)
Chloride: 94 mmol/L — ABNORMAL LOW (ref 98–111)
Creatinine, Ser: 0.71 mg/dL (ref 0.61–1.24)
GFR, Estimated: >60 mL/min (ref >60)
Glucose, Bld: 232 mg/dL — ABNORMAL HIGH (ref 70–99)
Potassium: 4.7 mmol/L (ref 3.5–5.1)
Sodium: 132 mmol/L — ABNORMAL LOW (ref 135–145)

## 2023-07-23 LAB — CBC
HCT: 39.4 % (ref 39.0–52.0)
Hemoglobin: 11.8 g/dL — ABNORMAL LOW (ref 13.0–17.0)
MCH: 26.9 pg (ref 26.0–34.0)
MCHC: 29.9 g/dL — ABNORMAL LOW (ref 30.0–36.0)
MCV: 90 fL (ref 80.0–100.0)
Platelets: 398 10*3/uL (ref 150–400)
RBC: 4.38 MIL/uL (ref 4.22–5.81)
RDW: 13.7 % (ref 11.5–15.5)
WBC: 7.7 10*3/uL (ref 4.0–10.5)
nRBC: 0 % (ref 0.0–0.2)

## 2023-07-23 MED ORDER — AMOXICILLIN-POT CLAVULANATE 875-125 MG PO TABS: 1.0000 | ORAL_TABLET | Freq: Two times a day (BID) | ORAL | 0 refills | Status: AC

## 2023-07-23 MED ORDER — ALBUTEROL SULFATE HFA 108 (90 BASE) MCG/ACT IN AERS: 1.0000 | INHALATION_SPRAY | Freq: Four times a day (QID) | RESPIRATORY_TRACT | 0 refills | Status: DC | PRN

## 2023-07-23 MED ORDER — BUDESONIDE-FORMOTEROL FUMARATE 160-4.5 MCG/ACT IN AERO: 2.0000 | INHALATION_SPRAY | Freq: Two times a day (BID) | RESPIRATORY_TRACT | 0 refills | Status: DC

## 2023-07-23 MED ORDER — GUAIFENESIN ER 600 MG PO TB12
1200.0000 mg | ORAL_TABLET | Freq: Two times a day (BID) | ORAL | 0 refills | Status: AC
Start: 2023-07-23 — End: 2023-08-07

## 2023-07-23 MED ORDER — PREDNISONE 20 MG PO TABS: 40.0000 mg | ORAL_TABLET | Freq: Every day | ORAL | 0 refills | Status: AC

## 2023-07-23 MED ORDER — IPRATROPIUM-ALBUTEROL 0.5-2.5 (3) MG/3ML IN SOLN: 3.0000 mL | RESPIRATORY_TRACT | 1 refills | Status: DC | PRN

## 2023-07-23 NOTE — Discharge Summary (Signed)
 Glenn [name] [id number] DOB: May 04, 1960 DOA: 07/19/2023  PCP: [facility]  Admit date: 07/19/2023  Discharge date: 07/23/2023  Admitted From: Home   disposition: Home   Recommendations for Outpatient Follow-up:   Follow up with PCP in 1-2 weeks   Home Health: N/A Equipment/Devices: N/A Consultations: None Discharge Condition: Improved CODE STATUS: Full     Chief Complaint  Patient presents with   Shortness of Breath     Brief history of present illness from the day of admission and additional interim summary     63 y.o. male with medical history significant of COPD/asthma ,sarcoidosis, hx CVA without residual deficit, received from outside ED for further management of acute hypoxic respiratory failure 2/2 CAP/COPD/Sarcoid exacerbation .  Patient reports worsening productive cough with shortness of breath for the past few weeks, with associated wheezing. No fevers, abdominal pain, nvd, chills.  He admits he is non-compliant with his prescribed O2 at home due to the machine being too bulky for he and his wife's current living arrangement.    He was initially saturating in the high 70's on room air at outside facility. In sinus tachycardia, afebrile without leukocytosis. His troponin, ABG, and lactic acid were reassuring. Currently saturating appropriately on 35% HFNC. The patient reports improvement in his symptoms since receiving steroids at outside ED. CXR with right sided infiltrate concerning for developing pneumonia.                                                                  Hospital Course   Patient was treated for CAP and COPD with marked improvement of symptoms.  On day of discharge patient was walking around the nursing station without any difficulty, requesting to go home.  He was  noted that patient has been noncompliant with his antihypertensives, he was instructed to follow-up with his PCP to manage his hypertension, diabetes, BPH and history of CVA.  Patient was educated on importance of taking his medications especially department CVAs in the future.  Patient agreed to follow-up with PCP had no questions on discharge.  He was discharged home on prednisone , and Augmentin with reinitiation of his inhaled bronchodilators.   Discharge diagnosis     Principal Problem:   Acute respiratory failure with hypoxia (HCC) Active Problems:   COPD with acute exacerbation (HCC)   Diabetes mellitus type 2 in nonobese (HCC)   Hyperlipidemia   Personal history of sarcoidosis   History of CVA (cerebrovascular accident)   CAP (community acquired pneumonia)    Discharge instructions    Discharge Instructions     Discharge instructions   Complete by: As directed    Please take your Augmentin and your prednisone  as prescribed, do not stop them early.  I have refilled your inhalers and nebulizer medications that your  PCP had prescribed for you but I have not given you any refills.  It is really important that you follow-up with your PCP in 1 to 2 weeks to get refills on these medications which are important for you to continue to take.  Also, please address your high blood pressure with your PCP when you see them.  I understand you are not taking both of the medications that have been prescribed but it looks like you really do need both medications.  If you do not like the side effect of one of the medications, discussed this with your PCP because there are other medications they could put you on.   Increase activity slowly   Complete by: As directed        Discharge Medications   Allergies as of 07/23/2023       Reactions   Insulins Swelling   04/2020. Do not use Novolog  insulin . Swelling of tongue   Ibuprofen Other (See Comments)   Abdominal sensitivity   Lisinopril  Swelling, Rash   Tongue swelling   Shrimp [shellfish Allergy ] Itching   Tylenol  [acetaminophen ] Nausea Only   Abdominal sensitvity   Pork-derived Products Other (See Comments)   Cultural-Muslim        Medication List     STOP taking these medications    fluticasone  50 MCG/ACT nasal spray Commonly known as: FLONASE    gabapentin  300 MG capsule Commonly known as: Neurontin    HYDROcodone  bit-homatropine 5-1.5 MG/5ML syrup Commonly known as: Hycodan   hydrOXYzine  25 MG tablet Commonly known as: ATARAX    nitroGLYCERIN  0.4 MG SL tablet Commonly known as: NITROSTAT    VITAMIN C PO   Vitamin D  (Ergocalciferol ) 1.25 MG (50000 UNIT) Caps capsule Commonly known as: DRISDOL        TAKE these medications    albuterol  108 (90 Base) MCG/ACT inhaler Commonly known as: VENTOLIN  HFA Inhale 1-2 puffs into the lungs every 6 (six) hours as needed for wheezing or shortness of breath. What changed: Another medication with the same name was removed. Continue taking this medication, and follow the directions you see here.   amLODipine  10 MG tablet Commonly known as: NORVASC  Take 1 tablet (10 mg total) by mouth daily.   amoxicillin-clavulanate 875-125 MG tablet Commonly known as: AUGMENTIN Take 1 tablet by mouth 2 (two) times daily for 5 days.   aspirin  EC 81 MG tablet Take 81 mg by mouth daily. Swallow whole.   atorvastatin  40 MG tablet Commonly known as: LIPITOR Take 1 tablet (40 mg total) by mouth daily.   budesonide -formoterol  160-4.5 MCG/ACT inhaler Commonly known as: SYMBICORT  Inhale 2 puffs into the lungs 2 (two) times daily.   clopidogrel  75 MG tablet Commonly known as: PLAVIX  Take 75 mg by mouth daily as needed (headache - takes in place of aspirin .).   famotidine  20 MG tablet Commonly known as: Pepcid  One after supper What changed:  how much to take how to take this when to take this reasons to take this additional instructions   guaiFENesin  600 MG 12 hr  tablet Commonly known as: Mucinex  Take 2 tablets (1,200 mg total) by mouth 2 (two) times daily for 15 days.   ipratropium-albuterol  0.5-2.5 (3) MG/3ML Soln Commonly known as: DUONEB Take 3 mLs by nebulization every 4 (four) hours as needed. What changed: reasons to take this   loratadine  10 MG tablet Commonly known as: Claritin  Take 1 tablet (10 mg total) by mouth daily.   losartan -hydrochlorothiazide  100-12.5 MG tablet Commonly known as: HYZAAR Take  1 tablet by mouth daily.   metFORMIN  500 MG tablet Commonly known as: GLUCOPHAGE  Take 1 tablet (500 mg total) by mouth 2 (two) times daily with a meal. What changed:  when to take this reasons to take this   naloxone  4 MG/0.1ML Liqd nasal spray kit Commonly known as: NARCAN  Place 1 spray into the nose as needed (opiod overdose - respiratory distress).   OxyCONTIN  10 mg 12 hr tablet Generic drug: oxyCODONE  Take 10 mg by mouth every 12 (twelve) hours.   Oxycodone  HCl 20 MG Tabs Take 20 mg by mouth 4 (four) times daily as needed (pain).   pantoprazole  40 MG tablet Commonly known as: PROTONIX  Take 1 tablet (40 mg total) by mouth daily.   prazosin 1 MG capsule Commonly known as: MINIPRESS Take 1 mg by mouth at bedtime.   predniSONE  20 MG tablet Commonly known as: DELTASONE  Take 2 tablets (40 mg total) by mouth daily with breakfast for 3 days.   sildenafil  100 MG tablet Commonly known as: VIAGRA  Take 1 tablet (100 mg total) by mouth daily as needed. What changed: reasons to take this   tamsulosin  0.4 MG Caps capsule Commonly known as: FLOMAX  Take 0.4 mg by mouth daily.   Tiotropium Bromide  Monohydrate 2.5 MCG/ACT Aers Inhale 2 each into the lungs daily.          Major procedures and Radiology Reports - PLEASE review detailed and final reports thoroughly  -       DG Chest Port 1 View Result Date: 07/19/2023 CLINICAL DATA:  Shortness of breath. EXAM: PORTABLE CHEST 1 VIEW COMPARISON:  Chest radiograph dated  05/19/2023. FINDINGS: Right perihilar density, new since the prior radiograph concerning for developing infiltrate. No pleural effusion pneumothorax. Stable cardiac silhouette. No acute osseous pathology. IMPRESSION: Right perihilar density concerning for developing infiltrate. Electronically Signed   By: Angus Bark M.D.   On: 07/19/2023 10:51    Micro Results    Recent Results (from the past 240 hours)  Blood culture (routine x 2)     Status: None (Preliminary result)   Collection Time: 07/19/23 10:56 AM   Specimen: BLOOD LEFT ARM  Result Value Ref Range Status   Specimen Description   Final    BLOOD LEFT ARM Performed at [facility], [address], [place], [place] [address]    Special Requests   Final    BOTTLES DRAWN AEROBIC AND ANAEROBIC Blood Culture adequate volume Performed at [facility], [address] [place], [place] [address]    Culture   Final    NO GROWTH 4 DAYS Performed at [facility], [address] [place], [place] [address]    Report Status PENDING  Incomplete  Blood culture (routine x 2)     Status: None (Preliminary result)   Collection Time: 07/19/23 11:01 AM   Specimen: BLOOD RIGHT ARM  Result Value Ref Range Status   Specimen Description   Final    BLOOD RIGHT ARM Performed at [facility], [address], [place], [place] [address]    Special Requests   Final    BOTTLES DRAWN AEROBIC ONLY Blood Culture adequate volume Performed at [facility], [address] [place], [place] [address]    Culture   Final    NO GROWTH 4 DAYS Performed at [facility], [address] [place], [place] [address]    Report Status PENDING  Incomplete  SARS Coronavirus 2 by RT PCR (  hospital order, performed in [facility] hospital lab) *cepheid single result test* Anterior Nasal Swab     Status: None   Collection Time: 07/20/23  8:57 AM   Specimen: Anterior Nasal Swab  Result Value Ref Range  Status   SARS Coronavirus 2 by RT PCR NEGATIVE NEGATIVE Final    Comment: (NOTE) SARS-CoV-2 target nucleic acids are NOT DETECTED.  The SARS-CoV-2 RNA is generally detectable in upper and lower respiratory specimens during the acute phase of infection. The lowest concentration of SARS-CoV-2 viral copies this assay can detect is 250 copies / mL. A negative result does not preclude SARS-CoV-2 infection and should not be used as the sole basis for treatment or other patient management decisions.  A negative result may occur with improper specimen collection / handling, submission of specimen other than nasopharyngeal swab, presence of viral mutation(s) within the areas targeted by this assay, and inadequate number of viral copies (<250 copies / mL). A negative result must be combined with clinical observations, patient history, and epidemiological information.  Fact Sheet for Patients:   RoadLapTop.co.za  Fact Sheet for Healthcare Providers: http://kim-miller.com/  This test is not yet approved or  cleared by the [place]  FDA and has been authorized for detection and/or diagnosis of SARS-CoV-2 by FDA under an Emergency Use Authorization (EUA).  This EUA will remain in effect (meaning this test can be used) for the duration of the COVID-19 declaration under Section 564(b)(1) of the Act, 21 U.S.C. section 360bbb-3(b)(1), unless the authorization is terminated or revoked sooner.  Performed at [facility], [address], [place], [place] [address]   Respiratory (~20 pathogens) panel by PCR     Status: None   Collection Time: 07/20/23  8:57 AM   Specimen: Nasopharyngeal Swab; Respiratory  Result Value Ref Range Status   Adenovirus NOT DETECTED NOT DETECTED Final   Coronavirus 229E NOT DETECTED NOT DETECTED Final    Comment: (NOTE) The Coronavirus on the Respiratory Panel, DOES NOT test for the novel  Coronavirus  (2019 nCoV)    Coronavirus HKU1 NOT DETECTED NOT DETECTED Final   Coronavirus NL63 NOT DETECTED NOT DETECTED Final   Coronavirus OC43 NOT DETECTED NOT DETECTED Final   Metapneumovirus NOT DETECTED NOT DETECTED Final   Rhinovirus / Enterovirus NOT DETECTED NOT DETECTED Final   Influenza A NOT DETECTED NOT DETECTED Final   Influenza B NOT DETECTED NOT DETECTED Final   Parainfluenza Virus 1 NOT DETECTED NOT DETECTED Final   Parainfluenza Virus 2 NOT DETECTED NOT DETECTED Final   Parainfluenza Virus 3 NOT DETECTED NOT DETECTED Final   Parainfluenza Virus 4 NOT DETECTED NOT DETECTED Final   Respiratory Syncytial Virus NOT DETECTED NOT DETECTED Final   Bordetella pertussis NOT DETECTED NOT DETECTED Final   Bordetella Parapertussis NOT DETECTED NOT DETECTED Final   Chlamydophila pneumoniae NOT DETECTED NOT DETECTED Final   Mycoplasma pneumoniae NOT DETECTED NOT DETECTED Final    Comment: Performed at [facility], [address], [place], [place] [address]    Today   Subjective    Glenn [name] feels much improved since admission.  Feels ready to go home.  Denies chest pain, shortness of breath or abdominal pain.  Feels they can take care of themselves with the resources they have at home.  Objective   Blood pressure (!) 160/106, pulse 100, temperature 97.7 F (36.5 C), temperature source Oral, resp. rate (!) 25, height 5' 5 (1.651 m), weight 65.8 kg, SpO2 (!) 88%.  Intake/Output Summary (Last 24 hours) at 07/23/2023 2012 Last data filed at 07/23/2023 1009 Gross per 24 hour  Intake 360 ml  Output 1000 ml  Net -640 ml    Exam General: Patient appears well and in good spirits sitting up in bed in no acute distress.  Eyes: sclera anicteric, conjuctiva mild injection bilaterally CVS: S1-S2, regular  Respiratory:  decreased air entry bilaterally secondary to decreased inspiratory effort, rales at bases  GI: NABS, soft, NT  LE: No edema.  Neuro: A/O x 3, Moving all extremities  equally with normal strength, CN 3-12 intact, grossly nonfocal.  Psych: patient is logical and coherent, judgement and insight appear normal, mood and affect appropriate to situation.    Data Review   CBC w Diff:  Lab Results  Component Value Date   WBC 7.7 07/23/2023   HGB 11.8 (L) 07/23/2023   HCT 39.4 07/23/2023   PLT 398 07/23/2023   LYMPHOPCT 36 05/19/2023   MONOPCT 13 05/19/2023   EOSPCT 7 05/19/2023   BASOPCT 1 05/19/2023    CMP:  Lab Results  Component Value Date   NA 132 (L) 07/23/2023   K 4.7 07/23/2023   CL 94 (L) 07/23/2023   CO2 30 07/23/2023   BUN 15 07/23/2023   CREATININE 0.71 07/23/2023   PROT 7.2 03/25/2023   ALBUMIN 4.3 03/25/2023   BILITOT 0.3 03/25/2023   ALKPHOS 68 03/25/2023   AST 16 03/25/2023   ALT 15 03/25/2023  .   Total Time in preparing paper work, data evaluation and todays exam - 35 minutes  Glenn [name] M.D on 07/23/2023 at 8:12 PM  [facility]

## 2023-07-24 LAB — CULTURE, BLOOD (ROUTINE X 2)
Culture: NO GROWTH
Culture: NO GROWTH
Special Requests: ADEQUATE
Special Requests: ADEQUATE

## 2023-08-26 ENCOUNTER — Encounter (HOSPITAL_COMMUNITY): Payer: Self-pay | Admitting: Internal Medicine

## 2023-08-26 ENCOUNTER — Emergency Department (HOSPITAL_BASED_OUTPATIENT_CLINIC_OR_DEPARTMENT_OTHER)

## 2023-08-26 ENCOUNTER — Other Ambulatory Visit: Payer: Self-pay

## 2023-08-26 ENCOUNTER — Inpatient Hospital Stay (HOSPITAL_BASED_OUTPATIENT_CLINIC_OR_DEPARTMENT_OTHER)
Admission: EM | Admit: 2023-08-26 | Discharge: 2023-08-29 | DRG: 190 | Disposition: A | Discharge status: Home / Self Care | Attending: Internal Medicine | Admitting: Internal Medicine

## 2023-08-26 DIAGNOSIS — J441 Chronic obstructive pulmonary disease with (acute) exacerbation: Principal | ICD-10-CM | POA: Diagnosis present

## 2023-08-26 DIAGNOSIS — Z7984 Long term (current) use of oral hypoglycemic drugs: Secondary | ICD-10-CM

## 2023-08-26 DIAGNOSIS — Z87891 Personal history of nicotine dependence: Secondary | ICD-10-CM

## 2023-08-26 DIAGNOSIS — N400 Enlarged prostate without lower urinary tract symptoms: Secondary | ICD-10-CM | POA: Diagnosis present

## 2023-08-26 DIAGNOSIS — Z91014 Allergy to mammalian meats: Secondary | ICD-10-CM

## 2023-08-26 DIAGNOSIS — Z1152 Encounter for screening for COVID-19: Secondary | ICD-10-CM

## 2023-08-26 DIAGNOSIS — Z91013 Allergy to seafood: Secondary | ICD-10-CM

## 2023-08-26 DIAGNOSIS — I152 Hypertension secondary to endocrine disorders: Secondary | ICD-10-CM | POA: Diagnosis present

## 2023-08-26 DIAGNOSIS — Z888 Allergy status to other drugs, medicaments and biological substances status: Secondary | ICD-10-CM

## 2023-08-26 DIAGNOSIS — E1159 Type 2 diabetes mellitus with other circulatory complications: Secondary | ICD-10-CM | POA: Diagnosis present

## 2023-08-26 DIAGNOSIS — Z79899 Other long term (current) drug therapy: Secondary | ICD-10-CM

## 2023-08-26 DIAGNOSIS — F418 Other specified anxiety disorders: Secondary | ICD-10-CM | POA: Diagnosis present

## 2023-08-26 DIAGNOSIS — G8929 Other chronic pain: Secondary | ICD-10-CM | POA: Diagnosis present

## 2023-08-26 DIAGNOSIS — Z7982 Long term (current) use of aspirin: Secondary | ICD-10-CM

## 2023-08-26 DIAGNOSIS — E1165 Type 2 diabetes mellitus with hyperglycemia: Secondary | ICD-10-CM | POA: Diagnosis present

## 2023-08-26 DIAGNOSIS — Z7902 Long term (current) use of antithrombotics/antiplatelets: Secondary | ICD-10-CM

## 2023-08-26 DIAGNOSIS — E785 Hyperlipidemia, unspecified: Secondary | ICD-10-CM | POA: Diagnosis present

## 2023-08-26 DIAGNOSIS — Z79891 Long term (current) use of opiate analgesic: Secondary | ICD-10-CM

## 2023-08-26 DIAGNOSIS — J9601 Acute respiratory failure with hypoxia: Secondary | ICD-10-CM | POA: Diagnosis present

## 2023-08-26 DIAGNOSIS — G894 Chronic pain syndrome: Secondary | ICD-10-CM | POA: Diagnosis present

## 2023-08-26 DIAGNOSIS — Z7951 Long term (current) use of inhaled steroids: Secondary | ICD-10-CM

## 2023-08-26 DIAGNOSIS — Z8673 Personal history of transient ischemic attack (TIA), and cerebral infarction without residual deficits: Secondary | ICD-10-CM

## 2023-08-26 DIAGNOSIS — Z886 Allergy status to analgesic agent status: Secondary | ICD-10-CM

## 2023-08-26 DIAGNOSIS — E875 Hyperkalemia: Secondary | ICD-10-CM | POA: Diagnosis present

## 2023-08-26 DIAGNOSIS — Z8249 Family history of ischemic heart disease and other diseases of the circulatory system: Secondary | ICD-10-CM

## 2023-08-26 DIAGNOSIS — F419 Anxiety disorder, unspecified: Secondary | ICD-10-CM | POA: Diagnosis present

## 2023-08-26 DIAGNOSIS — J9621 Acute and chronic respiratory failure with hypoxia: Secondary | ICD-10-CM | POA: Diagnosis present

## 2023-08-26 DIAGNOSIS — F32A Depression, unspecified: Secondary | ICD-10-CM | POA: Diagnosis present

## 2023-08-26 DIAGNOSIS — Z833 Family history of diabetes mellitus: Secondary | ICD-10-CM

## 2023-08-26 DIAGNOSIS — E119 Type 2 diabetes mellitus without complications: Secondary | ICD-10-CM

## 2023-08-26 DIAGNOSIS — E1169 Type 2 diabetes mellitus with other specified complication: Secondary | ICD-10-CM | POA: Diagnosis present

## 2023-08-26 DIAGNOSIS — I10 Essential (primary) hypertension: Secondary | ICD-10-CM | POA: Diagnosis present

## 2023-08-26 DIAGNOSIS — F4310 Post-traumatic stress disorder, unspecified: Secondary | ICD-10-CM | POA: Diagnosis present

## 2023-08-26 DIAGNOSIS — D869 Sarcoidosis, unspecified: Secondary | ICD-10-CM | POA: Diagnosis present

## 2023-08-26 LAB — CBC
HCT: 38.9 % — ABNORMAL LOW (ref 39.0–52.0)
Hemoglobin: 12.3 g/dL — ABNORMAL LOW (ref 13.0–17.0)
MCH: 26.3 pg (ref 26.0–34.0)
MCHC: 31.6 g/dL (ref 30.0–36.0)
MCV: 83.3 fL (ref 80.0–100.0)
Platelets: 446 K/uL — ABNORMAL HIGH (ref 150–400)
RBC: 4.67 MIL/uL (ref 4.22–5.81)
RDW: 15 % (ref 11.5–15.5)
WBC: 11.7 K/uL — ABNORMAL HIGH (ref 4.0–10.5)
nRBC: 0 % (ref 0.0–0.2)

## 2023-08-26 LAB — BASIC METABOLIC PANEL WITH GFR
Anion gap: 9 (ref 5–15)
BUN: 10 mg/dL (ref 8–23)
CO2: 29 mmol/L (ref 22–32)
Calcium: 10 mg/dL (ref 8.9–10.3)
Chloride: 97 mmol/L — ABNORMAL LOW (ref 98–111)
Creatinine, Ser: 0.88 mg/dL (ref 0.61–1.24)
GFR, Estimated: >60 mL/min (ref >60)
Glucose, Bld: 97 mg/dL (ref 70–99)
Potassium: 4.3 mmol/L (ref 3.5–5.1)
Sodium: 136 mmol/L (ref 135–145)

## 2023-08-26 LAB — RESP PANEL BY RT-PCR (RSV, FLU A&B, COVID)  RVPGX2
Influenza A by PCR: NEGATIVE
Influenza B by PCR: NEGATIVE
Resp Syncytial Virus by PCR: NEGATIVE
SARS Coronavirus 2 by RT PCR: NEGATIVE

## 2023-08-26 LAB — GLUCOSE, CAPILLARY: Glucose-Capillary: 328 mg/dL — ABNORMAL HIGH (ref 70–99)

## 2023-08-26 LAB — LACTIC ACID, PLASMA
Lactic Acid, Venous: 0.7 mmol/L (ref 0.5–1.9)
Lactic Acid, Venous: 0.7 mmol/L (ref 0.5–1.9)

## 2023-08-26 MED ORDER — IPRATROPIUM-ALBUTEROL 0.5-2.5 (3) MG/3ML IN SOLN: 3.0000 mL | Freq: Four times a day (QID) | RESPIRATORY_TRACT | Status: DC | PRN

## 2023-08-26 MED ORDER — IPRATROPIUM-ALBUTEROL 0.5-2.5 (3) MG/3ML IN SOLN
3.0000 mL | Freq: Once | RESPIRATORY_TRACT | Status: AC
  Administered 2023-08-26: 3 mL via RESPIRATORY_TRACT
  Filled 2023-08-26: qty 3

## 2023-08-26 MED ORDER — ALBUTEROL SULFATE (2.5 MG/3ML) 0.083% IN NEBU
INHALATION_SOLUTION | RESPIRATORY_TRACT | Status: AC
  Administered 2023-08-26: 2.5 mg via RESPIRATORY_TRACT
  Filled 2023-08-26: qty 6

## 2023-08-26 MED ORDER — CLOPIDOGREL BISULFATE 75 MG PO TABS
75.0000 mg | ORAL_TABLET | Freq: Every day | ORAL | Status: DC
  Administered 2023-08-27 – 2023-08-29 (×3): 75 mg via ORAL
  Filled 2023-08-26 (×3): qty 1

## 2023-08-26 MED ORDER — ALBUTEROL SULFATE (2.5 MG/3ML) 0.083% IN NEBU
2.5000 mg | INHALATION_SOLUTION | Freq: Once | RESPIRATORY_TRACT | Status: AC
  Administered 2023-08-26: 2.5 mg via RESPIRATORY_TRACT

## 2023-08-26 MED ORDER — AZITHROMYCIN 250 MG PO TABS
500.0000 mg | ORAL_TABLET | Freq: Every day | ORAL | Status: AC
  Administered 2023-08-27: 500 mg via ORAL
  Filled 2023-08-26: qty 2

## 2023-08-26 MED ORDER — ARFORMOTEROL TARTRATE 15 MCG/2ML IN NEBU
15.0000 ug | INHALATION_SOLUTION | Freq: Two times a day (BID) | RESPIRATORY_TRACT | Status: DC
  Administered 2023-08-26 – 2023-08-29 (×7): 15 ug via RESPIRATORY_TRACT
  Filled 2023-08-26 (×7): qty 2

## 2023-08-26 MED ORDER — TIOTROPIUM BROMIDE MONOHYDRATE 2.5 MCG/ACT IN AERS: 2.0000 | INHALATION_SPRAY | Freq: Every day | RESPIRATORY_TRACT | Status: DC

## 2023-08-26 MED ORDER — OXYCODONE HCL ER 10 MG PO T12A
10.0000 mg | EXTENDED_RELEASE_TABLET | Freq: Two times a day (BID) | ORAL | Status: DC
  Administered 2023-08-27 – 2023-08-29 (×5): 10 mg via ORAL
  Filled 2023-08-26 (×5): qty 1

## 2023-08-26 MED ORDER — ONDANSETRON HCL 4 MG/2ML IJ SOLN: 4.0000 mg | Freq: Four times a day (QID) | INTRAMUSCULAR | Status: DC | PRN

## 2023-08-26 MED ORDER — OXYCODONE HCL ER 10 MG PO T12A
10.0000 mg | EXTENDED_RELEASE_TABLET | Freq: Once | ORAL | Status: AC
  Administered 2023-08-26: 10 mg via ORAL
  Filled 2023-08-26: qty 1

## 2023-08-26 MED ORDER — AMLODIPINE BESYLATE 10 MG PO TABS
10.0000 mg | ORAL_TABLET | Freq: Every day | ORAL | Status: DC
  Administered 2023-08-26 – 2023-08-29 (×4): 10 mg via ORAL
  Filled 2023-08-26 (×4): qty 1

## 2023-08-26 MED ORDER — OXYCODONE HCL 5 MG PO TABS
20.0000 mg | ORAL_TABLET | Freq: Four times a day (QID) | ORAL | Status: DC | PRN
  Administered 2023-08-26 – 2023-08-29 (×11): 20 mg via ORAL
  Filled 2023-08-26 (×11): qty 4

## 2023-08-26 MED ORDER — METHYLPREDNISOLONE SODIUM SUCC 40 MG IJ SOLR
40.0000 mg | Freq: Two times a day (BID) | INTRAMUSCULAR | Status: DC
  Administered 2023-08-27 – 2023-08-28 (×3): 40 mg via INTRAVENOUS
  Filled 2023-08-26 (×3): qty 1

## 2023-08-26 MED ORDER — UMECLIDINIUM BROMIDE 62.5 MCG/ACT IN AEPB
1.0000 | INHALATION_SPRAY | Freq: Every day | RESPIRATORY_TRACT | Status: DC
  Filled 2023-08-26: qty 7

## 2023-08-26 MED ORDER — TRAZODONE HCL 50 MG PO TABS
25.0000 mg | ORAL_TABLET | Freq: Every evening | ORAL | Status: DC | PRN
  Administered 2023-08-27 – 2023-08-28 (×2): 25 mg via ORAL
  Filled 2023-08-26 (×3): qty 1

## 2023-08-26 MED ORDER — INSULIN ASPART 100 UNIT/ML IJ SOLN: 0.0000 [IU] | Freq: Every day | INTRAMUSCULAR | Status: DC

## 2023-08-26 MED ORDER — TAMSULOSIN HCL 0.4 MG PO CAPS
0.4000 mg | ORAL_CAPSULE | Freq: Every day | ORAL | Status: DC
  Administered 2023-08-27 – 2023-08-29 (×2): 0.4 mg via ORAL
  Filled 2023-08-26 (×3): qty 1

## 2023-08-26 MED ORDER — AZITHROMYCIN 250 MG PO TABS
250.0000 mg | ORAL_TABLET | Freq: Every day | ORAL | Status: DC
  Administered 2023-08-27 – 2023-08-29 (×3): 250 mg via ORAL
  Filled 2023-08-26 (×3): qty 1

## 2023-08-26 MED ORDER — ATORVASTATIN CALCIUM 40 MG PO TABS
40.0000 mg | ORAL_TABLET | Freq: Every day | ORAL | Status: DC
  Administered 2023-08-27 – 2023-08-29 (×3): 40 mg via ORAL
  Filled 2023-08-26 (×3): qty 1

## 2023-08-26 MED ORDER — LORATADINE 10 MG PO TABS
10.0000 mg | ORAL_TABLET | Freq: Every day | ORAL | Status: DC
  Administered 2023-08-27 – 2023-08-29 (×3): 10 mg via ORAL
  Filled 2023-08-26 (×3): qty 1

## 2023-08-26 MED ORDER — INSULIN ASPART 100 UNIT/ML IJ SOLN: 0.0000 [IU] | Freq: Three times a day (TID) | INTRAMUSCULAR | Status: DC

## 2023-08-26 MED ORDER — ALBUTEROL SULFATE (2.5 MG/3ML) 0.083% IN NEBU: 2.5000 mg | INHALATION_SOLUTION | Freq: Once | RESPIRATORY_TRACT | Status: AC

## 2023-08-26 MED ORDER — METFORMIN HCL 500 MG PO TABS
500.0000 mg | ORAL_TABLET | Freq: Two times a day (BID) | ORAL | Status: DC
  Administered 2023-08-28 – 2023-08-29 (×2): 500 mg via ORAL
  Filled 2023-08-26 (×2): qty 1

## 2023-08-26 MED ORDER — ALBUTEROL SULFATE (2.5 MG/3ML) 0.083% IN NEBU: 2.5000 mg | INHALATION_SOLUTION | RESPIRATORY_TRACT | Status: DC | PRN

## 2023-08-26 MED ORDER — METHYLPREDNISOLONE SODIUM SUCC 125 MG IJ SOLR
125.0000 mg | Freq: Once | INTRAMUSCULAR | Status: AC
  Administered 2023-08-26: 125 mg via INTRAVENOUS
  Filled 2023-08-26: qty 2

## 2023-08-26 MED ORDER — ONDANSETRON HCL 4 MG PO TABS: 4.0000 mg | ORAL_TABLET | Freq: Four times a day (QID) | ORAL | Status: DC | PRN

## 2023-08-26 MED ORDER — IOHEXOL 350 MG/ML SOLN
75.0000 mL | Freq: Once | INTRAVENOUS | Status: AC | PRN
  Administered 2023-08-26: 75 mL via INTRAVENOUS

## 2023-08-26 MED ORDER — BUDESONIDE 0.25 MG/2ML IN SUSP
0.2500 mg | Freq: Two times a day (BID) | RESPIRATORY_TRACT | Status: DC
  Administered 2023-08-26: 0.25 mg via RESPIRATORY_TRACT
  Filled 2023-08-26: qty 2

## 2023-08-26 MED ORDER — FLUTICASONE FUROATE-VILANTEROL 200-25 MCG/ACT IN AEPB
1.0000 | INHALATION_SPRAY | Freq: Every day | RESPIRATORY_TRACT | Status: DC
  Filled 2023-08-26: qty 28

## 2023-08-26 MED ORDER — HYDRALAZINE HCL 20 MG/ML IJ SOLN: 10.0000 mg | INTRAMUSCULAR | Status: DC | PRN

## 2023-08-26 MED ORDER — OXYCODONE HCL 5 MG PO TABS
10.0000 mg | ORAL_TABLET | Freq: Once | ORAL | Status: AC
  Administered 2023-08-26: 10 mg via ORAL
  Filled 2023-08-26: qty 2

## 2023-08-26 MED ORDER — ACETAMINOPHEN 325 MG PO TABS
650.0000 mg | ORAL_TABLET | Freq: Four times a day (QID) | ORAL | Status: DC | PRN
Start: 2023-08-26 — End: 2023-08-30

## 2023-08-26 MED ORDER — GUAIFENESIN ER 600 MG PO TB12
600.0000 mg | ORAL_TABLET | Freq: Two times a day (BID) | ORAL | Status: DC
  Administered 2023-08-26: 600 mg via ORAL
  Filled 2023-08-26: qty 1

## 2023-08-26 MED ORDER — PRAZOSIN HCL 1 MG PO CAPS
1.0000 mg | ORAL_CAPSULE | Freq: Every day | ORAL | Status: DC
Start: 2023-08-26 — End: 2023-08-30
  Administered 2023-08-26 – 2023-08-28 (×3): 1 mg via ORAL
  Filled 2023-08-26 (×4): qty 1

## 2023-08-26 MED ORDER — ACETAMINOPHEN 650 MG RE SUPP: 650.0000 mg | Freq: Four times a day (QID) | RECTAL | Status: DC | PRN

## 2023-08-26 MED ORDER — SENNOSIDES-DOCUSATE SODIUM 8.6-50 MG PO TABS: 1.0000 | ORAL_TABLET | Freq: Every evening | ORAL | Status: DC | PRN

## 2023-08-26 NOTE — Hospital Course (Signed)
 Glenn [name] is a 63 y.o. male with medical history significant for COPD, sarcoidosis, history of CVA, T2DM, HTN, HLD, BPH, PTSD, depression/anxiety, chronic pain syndrome who is admitted with acute hypoxic respiratory failure due to COPD exacerbation.

## 2023-08-26 NOTE — ED Provider Notes (Signed)
 [place] EMERGENCY DEPARTMENT AT [facility] Provider Note   CSN: [id number] Arrival date & time: 08/26/23  9143     Patient presents with: Shortness of Breath   Glenn [name] is a 63 y.o. male.   63 year old male presenting with shortness of breath.  Patient has COPD/sarcoidosis, was hospitalized in June for a COPD/sarcoidosis exacerbation/pneumonia, at that time was discharged on Augmentin /prednisone , he has since been switched to a different antibiotic through his provider at the [facility], he is unable to tell me the name of this antibiotic.  He reports increased work of breathing over the past several days, does have a persistent cough with green sputum production but states this is normal for him and has not worsened, denies fevers at home.  He is not on oxygen  at home.   Shortness of Breath      Prior to Admission medications   Medication Sig Start Date End Date Taking? Authorizing Provider  albuterol  (VENTOLIN  HFA) 108 (90 Base) MCG/ACT inhaler Inhale 1-2 puffs into the lungs every 6 (six) hours as needed for wheezing or shortness of breath. 07/23/23 08/22/23  Chatterjee, Srobona Tublu, MD  amLODipine  (NORVASC ) 10 MG tablet Take 1 tablet (10 mg total) by mouth daily. 10/03/21   Rai, Nydia POUR, MD  aspirin  EC 81 MG tablet Take 81 mg by mouth daily. Swallow whole.    [provider]  atorvastatin  (LIPITOR) 40 MG tablet Take 1 tablet (40 mg total) by mouth daily. 06/26/20   Pokhrel, Laxman, MD  budesonide -formoterol  (SYMBICORT ) 160-4.5 MCG/ACT inhaler Inhale 2 puffs into the lungs 2 (two) times daily. 07/23/23 08/22/23  Chatterjee, Srobona Tublu, MD  clopidogrel  (PLAVIX ) 75 MG tablet Take 75 mg by mouth daily as needed (headache - takes in place of aspirin .). 07/06/21   [provider]  famotidine  (PEPCID ) 20 MG tablet One after supper Patient taking differently: Take 20 mg by mouth daily as needed for heartburn or indigestion. 11/22/21   Darlean Ozell NOVAK, MD   ipratropium-albuterol  (DUONEB) 0.5-2.5 (3) MG/3ML SOLN Take 3 mLs by nebulization every 4 (four) hours as needed. 07/23/23   Chatterjee, Srobona Tublu, MD  loratadine  (CLARITIN ) 10 MG tablet Take 1 tablet (10 mg total) by mouth daily. 01/03/22     losartan -hydrochlorothiazide  (HYZAAR) 100-12.5 MG tablet Take 1 tablet by mouth daily. 09/03/21   [provider]  metFORMIN  (GLUCOPHAGE ) 500 MG tablet Take 1 tablet (500 mg total) by mouth 2 (two) times daily with a meal. Patient taking differently: Take 500 mg by mouth daily as needed (high blood sugar). 10/03/21   Rai, Nydia POUR, MD  naloxone  (NARCAN ) nasal spray 4 mg/0.1 mL Place 1 spray into the nose as needed (opiod overdose - respiratory distress). 08/20/21   [provider]  oxyCODONE  (OXYCONTIN ) 10 mg 12 hr tablet Take 10 mg by mouth every 12 (twelve) hours.    [provider]  Oxycodone  HCl 20 MG TABS Take 20 mg by mouth 4 (four) times daily as needed (pain).    [provider]  pantoprazole  (PROTONIX ) 40 MG tablet Take 1 tablet (40 mg total) by mouth daily. 07/02/19   Elgergawy, Brayton RAMAN, MD  prazosin  (MINIPRESS ) 1 MG capsule Take 1 mg by mouth at bedtime.    [provider]  sildenafil  (VIAGRA ) 100 MG tablet Take 1 tablet (100 mg total) by mouth daily as needed. Patient taking differently: Take 100 mg by mouth daily as needed for erectile dysfunction. 01/20/23     tamsulosin  (FLOMAX ) 0.4  MG CAPS capsule Take 0.4 mg by mouth daily. 04/26/21   [provider]  Tiotropium Bromide  Monohydrate 2.5 MCG/ACT AERS Inhale 2 each into the lungs daily.    [provider]    Allergies: Insulins, Ibuprofen, Lisinopril, Shrimp [shellfish allergy ], Tylenol  [acetaminophen ], and Pork-derived products    Review of Systems  Respiratory:  Positive for shortness of breath.     Updated Vital Signs  Vitals:   08/26/23 1115 08/26/23 1130 08/26/23 1138 08/26/23 1158  BP: (!) 167/146 (!) 153/119     Pulse: (!) 110 (!) 109    Resp:  (!) 26    Temp:      TempSrc:      SpO2: 95% (!) 89% 93% 96%  Weight:      Height:         Physical Exam Vitals and nursing note reviewed.  HENT:     Head: Normocephalic.  Eyes:     Extraocular Movements: Extraocular movements intact.  Cardiovascular:     Rate and Rhythm: Regular rhythm. Tachycardia present.  Pulmonary:     Breath sounds: Wheezing (Diffuse expiratory) present.     Comments: Speaks in full sentences, no accessory muscle use, no tripoding, tachypneic but otherwise no signs of respiratory distress On 3L [place] Musculoskeletal:     Cervical back: Normal range of motion.     Right lower leg: No edema.     Left lower leg: No edema.     Comments: Moves all extremities spontaneously without difficulty  Skin:    General: Skin is warm and dry.  Neurological:     Mental Status: He is alert and oriented to person, place, and time.     (all labs ordered are listed, but only abnormal results are displayed) Labs Reviewed  CBC - Abnormal; Notable for the following components:      Result Value   WBC 11.7 (*)    Hemoglobin 12.3 (*)    HCT 38.9 (*)    Platelets 446 (*)    All other components within normal limits  BASIC METABOLIC PANEL WITH GFR - Abnormal; Notable for the following components:   Chloride 97 (*)    All other components within normal limits  GLUCOSE, CAPILLARY - Abnormal; Notable for the following components:   Glucose-Capillary 328 (*)    All other components within normal limits  RESP PANEL BY RT-PCR (RSV, FLU A&B, COVID)  RVPGX2  LACTIC ACID, PLASMA  LACTIC ACID, PLASMA  MAGNESIUM   HEMOGLOBIN A1C  BASIC METABOLIC PANEL WITH GFR  CBC    EKG: EKG Interpretation Date/Time:  Saturday August 26 2023 09:34:04 EDT Ventricular Rate:  104 PR Interval:  122 QRS Duration:  82 QT Interval:  342 QTC Calculation: 450 R Axis:   37  Text Interpretation: Sinus tachycardia Ventricular premature complex Aberrant conduction  of SV complex(es) Abnormal R-wave progression, early transition Probable LVH with secondary repol abnrm Confirmed by Towana Sharper [phone]) on 08/26/2023 9:36:20 AM  Radiology: CT Angio Chest PE W and/or Wo Contrast Result Date: 08/26/2023 CLINICAL DATA:  Dyspnea. COPD. Sarcoid. On treatment for pneumonia. EXAM: CT ANGIOGRAPHY CHEST WITH CONTRAST TECHNIQUE: Multidetector CT imaging of the chest was performed using the standard protocol during bolus administration of intravenous contrast. Multiplanar CT image reconstructions and MIPs were obtained to evaluate the vascular anatomy. RADIATION DOSE REDUCTION: This exam was performed according to the departmental dose-optimization program which includes automated exposure control, adjustment of the mA and/or kV according to patient size and/or use of  iterative reconstruction technique. CONTRAST:  75mL OMNIPAQUE  IOHEXOL  350 MG/ML SOLN COMPARISON:  Chest x-ray 08/26/2023. Noncontrast CT 10/19/2022. CTA 10/13/2022. FINDINGS: Cardiovascular: No segmental or larger pulmonary embolism identified. Some breathing motion identified which can limit evaluation of small peripheral emboli. Borderline size heart. Trace pericardial fluid. The thoracic aorta is normal course and caliber with some mild partially calcified atherosclerotic plaque. Coronary artery calcifications are seen. Please correlate for other coronary risk factors. Mediastinum/Nodes: No specific abnormal lymph node enlargement identified in the axillary region hila. A few small less than 1 cm size mediastinal nodes are identified, not pathologic by size criteria. This includes stable lymph node pre cardiac on series 4, image 78 with short axis of 6 mm. Slightly patulous thoracic esophagus. Preserved thyroid gland. Lungs/Pleura: Again there are postsurgical changes seen in both lungs with suture. There is diffuse bronchial wall thickening throughout the tracheobronchial tree with some areas of bronchial narrowing  such as right lower lobe and left lower lobe. Please correlate history of sarcoidosis. There is some ground-glass and interstitial opacities identified in the left lower lobe greater than right lower lobe. Please correlate for history of treatment for pneumonia. Underlying emphysematous lung changes are identified. No significant pleural effusion. No frank consolidation. Apical pleural thickening. Upper Abdomen: No acute abnormality. Musculoskeletal: Scattered degenerative changes along the spine. Review of the MIP images confirms the above findings. IMPRESSION: There is some breathing motion which can limit evaluation of small and peripheral emboli. No segmental or larger pulmonary embolism identified at this time. Extensive bronchial wall thickening with distortion and narrowing, similar to previous. Please correlate with known history. There is also underlying emphysematous lung changes. Interstitial and some ground-glass changes identified along the lung bases bilaterally. Please correlate with history of treatment for pneumonia and recommend follow-up. Aortic Atherosclerosis (ICD10-I70.0) and Emphysema (ICD10-J43.9). Electronically Signed   By: Ranell Bring M.D.   On: 08/26/2023 12:06   DG Chest Portable 1 View Result Date: 08/26/2023 CLINICAL DATA:  COPD and shortness of breath. EXAM: PORTABLE CHEST 1 VIEW COMPARISON:  07/19/2023 FINDINGS: Heart size and mediastinal contours are unremarkable. There are low lung volumes with asymmetric elevation of the left hemidiaphragm, unchanged from the prior exam. No significant pleural effusion or signs of pneumothorax. Increase interstitial markings are noted bilaterally consistent with chronic scarring and architectural distortion. Persistent ill-defined hazy opacity within the perihilar right lung. Visualized osseous structures are unremarkable. IMPRESSION: 1. Low lung volumes with asymmetric elevation of the left hemidiaphragm. 2. Chronic scarring and architectural  distortion. 3. Persistent ill-defined hazy opacity within the perihilar right lung. Electronically Signed   By: Waddell Calk M.D.   On: 08/26/2023 10:43     .Critical Care  Performed by: Glendia Rocky SAILOR, PA-C Authorized by: Glendia Rocky SAILOR, PA-C   Critical care provider statement:    Critical care time (minutes):  46   Critical care time was exclusive of:  Separately billable procedures and treating other patients and teaching time   Critical care was necessary to treat or prevent imminent or life-threatening deterioration of the following conditions:  Respiratory failure (COPD exacerbation with new oxygen  requirement)   Critical care was time spent personally by me on the following activities:  Ordering and performing treatments and interventions, development of treatment plan with patient or surrogate, ordering and review of laboratory studies, discussions with consultants, ordering and review of radiographic studies, pulse oximetry, evaluation of patient's response to treatment, re-evaluation of patient's condition, examination of patient, review of old charts and obtaining  history from patient or surrogate   I assumed direction of critical care for this patient from another provider in my specialty: no     Care discussed with: admitting provider and accepting provider at another facility      Medications Ordered in the ED  albuterol  (PROVENTIL ) (2.5 MG/3ML) 0.083% nebulizer solution 2.5 mg (2.5 mg Nebulization Given 08/26/23 0918)  albuterol  (PROVENTIL ) (2.5 MG/3ML) 0.083% nebulizer solution 2.5 mg (2.5 mg Nebulization Given 08/26/23 0917)  methylPREDNISolone  sodium succinate (SOLU-MEDROL ) 125 mg/2 mL injection 125 mg (125 mg Intravenous Given 08/26/23 0951)  ipratropium-albuterol  (DUONEB) 0.5-2.5 (3) MG/3ML nebulizer solution 3 mL (3 mLs Nebulization Given 08/26/23 1158)  iohexol  (OMNIPAQUE ) 350 MG/ML injection 75 mL (75 mLs Intravenous Contrast Given 08/26/23 1141)    Clinical Course as of  08/26/23 1827  Sat Aug 26, 2023  5413 63 year old male with history of COPD here with increased shortness of breath wheezing.  Has new oxygen  requirement.  CT not showing any large PE.  Will need admission for continued management. [MB]    Clinical Course User Index [MB] Towana Ozell BROCKS, MD                                 Medical Decision Making This patient presents to the ED for concern of shortness of breath, this involves an extensive number of treatment options, and is a complaint that carries with it a high risk of complications and morbidity.  The differential diagnosis includes COPD exacerbation, pneumonia, pulmonary embolism, complication of sarcoidosis, hypoxic respiratory failure.   Co morbidities that complicate the patient evaluation  COPD, sarcoidosis, recent diagnosis of pneumonia   Additional history obtained:  Additional history obtained from record review External records from outside source obtained and reviewed including recent hospital discharge [facility], [facility]   Lab Tests:  I Ordered, and personally interpreted labs.  The pertinent results include: CBC notable for leukocytosis with white blood cell count of 11.7, anemia with hemoglobin of 12.3 however this is improved as compared to patient's baseline, thrombocytosis of 446.  BMP largely unremarkable.  Respiratory panel negative.  Lactic 0.7. Magnesium  pending at time of transfer.   Imaging Studies ordered:  I ordered imaging studies including CXR  I independently visualized and interpreted imaging which showed  1. Low lung volumes with asymmetric elevation of the left hemidiaphragm. 2. Chronic scarring and architectural distortion. 3. Persistent ill-defined hazy opacity within the perihilar right lung.  CT PE study: There is some breathing motion which can limit evaluation of small and peripheral emboli. No segmental or larger pulmonary embolism identified at this time.  Extensive bronchial wall  thickening with distortion and narrowing, similar to previous. Please correlate with known history. There is also underlying emphysematous lung changes.  Interstitial and some ground-glass changes identified along the lung bases bilaterally. Please correlate with history of treatment for pneumonia and recommend follow-up.  Aortic Atherosclerosis (ICD10-I70.0) and Emphysema (ICD10-J43.9).  I agree with the radiologist interpretation   Cardiac Monitoring: / EKG:  The patient was maintained on a cardiac monitor.  I personally viewed and interpreted the cardiac monitored which showed an underlying rhythm of: Sinus tachycardia   Consultations Obtained:  I requested consultation with the hospitalist service,  and discussed lab and imaging findings as well as pertinent plan - they recommend: spoke with Dr. Roxane who agrees that this patient would likely benefit from hospital admission given his increased work of breathing and  new oxygen  requirement with recent pneumonia diagnosis on PO antibiotics.    Problem List / ED Course / Critical interventions / Medication management  I ordered medication including albuterol  neb x 3, DuoNeb for shortness of breath, Solu-Medrol  for suspected COPD exacerbation, Reevaluation of the patient after these medicines showed that the patient improved I have reviewed the patients home medicines and have made adjustments as needed   Social Determinants of Health:  Former tobacco use   Test / Admission - Considered:  Physical exam notable as above.  Patient received nebulized albuterol  treatment with respiratory therapist x 3 upon presentation to the emergency department, reports minimal symptomatic improvement following these treatments.  Did note some symptomatic improvement after DuoNeb, still demonstrates diffuse wheezing throughout.  See above for lab/imaging interpretation. Patient on oxygen  in the room, oxygen  was titrated down by nursing staff, while  sitting in the bed/not moving patient came down to 86% on room air, was uptitrated back to 1 to 2 L.  Patient has been persistently tachycardic, which I suspect is secondary to multiple breathing treatments today, however given worsening shortness of breath and tachycardia I did proceed with CT PE study.  See above for results.  Patient is not on oxygen  at home, given this and recent diagnosis of pneumonia with over 1 week of p.o. antibiotics in the outpatient setting, I am concerned that this patient's clinical picture is worsening and that he would benefit from admission.  I spoke with the hospitalist service as above who is in agreement with this plan, they plan to hold off on IV antibiotics for now given there is no obvious pneumonia on imaging.  Patient voiced understanding and is appropriate for admission.    Amount and/or Complexity of Data Reviewed Labs: ordered. Radiology: ordered.  Risk Prescription drug management. Decision regarding hospitalization.        Final diagnoses:  COPD exacerbation [facility])    ED Discharge Orders     None          Glendia Rocky LOISE DEVONNA 08/26/23 1830    Towana Ozell BROCKS, MD 08/27/23 [phone]

## 2023-08-26 NOTE — ED Triage Notes (Signed)
 States has pneumonia taking antibiotics but getting worse this week.  Present to triage labored breathing. O2 sats 86%

## 2023-08-26 NOTE — H&P (Signed)
 History and Physical    Glenn [name] FMW:[id number] DOB: 25-May-1960 DOA: 08/26/2023  PCP: [facility]  Patient coming from: Home  I have personally briefly reviewed patient's old medical records in [facility]  Chief Complaint: Shortness of breath  HPI: Glenn [name] is a 63 y.o. male with medical history significant for COPD, sarcoidosis, history of CVA, T2DM, HTN, HLD, BPH, PTSD, depression/anxiety, chronic pain syndrome who presented to the ED for evaluation of shortness of breath.  Patient was recently admitted 6/11-6/15 for acute hypoxic respiratory failure secondary to COPD/sarcoid exacerbation as well as suspected right perihilar pneumonia.  Patient was treated with steroids, antibiotics, bronchodilators with improvement.  He was ambulatory without difficulty and saturating well on room air at time of discharge.  He was discharged home with prednisone  and Augmentin .  Patient states that since then he has had persistent cough productive of light green sputum.  He says over the last several days he has had progressively worsening shortness of breath occurring with exertion and now at rest.  He had a coughing fit last night and had to elevate the head of his bed.  He says he has been using his home inhalers as prescribed.  He says he is not using supplemental oxygen  at home.  Patient states he is concerned about mold in in his home which is triggering his respiratory issues.  He has not had fevers, chills, diaphoresis, chest pain.  [facility] ED Course  Labs/Imaging on admission: I have personally reviewed following labs and imaging studies.  Initial vitals showed BP 159/109, pulse 108, RR 28, temp 98.4 F, SpO2 86% on room air.  Patient placed on 3 L O2 via [place] with SPO2 improved >92%.  Labs showed WBC 11.7, hemoglobin 12.3, platelets 446, sodium 136, potassium 4.3, bicarb 29, BUN 10, creatinine 0.88, serum glucose 97, lactic acid 0.7 x 2.  SARS-CoV-2,  influenza, RSV PCR negative.  CTA chest negative for segmental or larger pulmonary embolism.  Extensive bronchial wall thickening with distortion and narrowing similar to previous.  Underlying emphysematous lung changes noted.  Interstitial and some groundglass changes identified along the lung bases bilaterally.  Patient was given IV Solu-Medrol  125 mg, DuoNeb and albuterol  nebulizer treatments.  The hospitalist service was consulted to admit.  Review of Systems: All systems reviewed and are negative except as documented in history of present illness above.   Past Medical History:  Diagnosis Date   Anxiety    Arthritis    Asthma    Chronic pain 05/06/2020   COPD (chronic obstructive pulmonary disease) (HCC)    CVA (cerebral vascular accident) (HCC)    2010; 06/2020   Depression    Diabetes mellitus type 2 in nonobese [facility])    Dyspnea    ED (erectile dysfunction)    Hyperlipidemia    Hypertension    Sarcoid    Tobacco dependence 05/06/2020    Past Surgical History:  Procedure Laterality Date   LUNG SURGERY     removed tissue    Social History: Social History   Tobacco Use   Smoking status: Former    Current packs/day: 0.00    Average packs/day: 1 pack/day for 34.0 years (34.0 ttl pk-yrs)    Types: Cigarettes    Start date: 05/12/1986    Quit date: 05/11/2020    Years since quitting: 3.2    Passive exposure: Never   Smokeless tobacco: Never   Tobacco comments:    at the most smoked  6-7 cigarettes/day  Vaping Use   Vaping status: Never Used  Substance Use Topics   Alcohol use: Never   Drug use: Never   Allergies  Allergen Reactions   Insulins Swelling    04/2020. Do not use Novolog  insulin . Swelling of tongue   Ibuprofen Other (See Comments)    Abdominal sensitivity   Lisinopril Swelling and Rash    Tongue swelling   Shrimp [Shellfish Allergy ] Itching   Tylenol  [Acetaminophen ] Nausea Only    Abdominal sensitvity   Pork-Derived Products Other (See Comments)     Cultural-Muslim    Family History  Problem Relation Age of Onset   Hypertension Mother    Diabetes Maternal Grandmother    Colon cancer Neg Hx    Esophageal cancer Neg Hx    Rectal cancer Neg Hx    Inflammatory bowel disease Neg Hx    Liver disease Neg Hx    Pancreatic cancer Neg Hx      Prior to Admission medications   Medication Sig Start Date End Date Taking? Authorizing Provider  albuterol  (VENTOLIN  HFA) 108 (90 Base) MCG/ACT inhaler Inhale 1-2 puffs into the lungs every 6 (six) hours as needed for wheezing or shortness of breath. 07/23/23 08/22/23  Chatterjee, Srobona Tublu, MD  amLODipine  (NORVASC ) 10 MG tablet Take 1 tablet (10 mg total) by mouth daily. 10/03/21   Rai, Nydia POUR, MD  aspirin  EC 81 MG tablet Take 81 mg by mouth daily. Swallow whole.    [provider]  atorvastatin  (LIPITOR) 40 MG tablet Take 1 tablet (40 mg total) by mouth daily. 06/26/20   Pokhrel, Laxman, MD  budesonide -formoterol  (SYMBICORT ) 160-4.5 MCG/ACT inhaler Inhale 2 puffs into the lungs 2 (two) times daily. 07/23/23 08/22/23  Chatterjee, Srobona Tublu, MD  clopidogrel  (PLAVIX ) 75 MG tablet Take 75 mg by mouth daily as needed (headache - takes in place of aspirin .). 07/06/21   [provider]  famotidine  (PEPCID ) 20 MG tablet One after supper Patient taking differently: Take 20 mg by mouth daily as needed for heartburn or indigestion. 11/22/21   Darlean Ozell NOVAK, MD  ipratropium-albuterol  (DUONEB) 0.5-2.5 (3) MG/3ML SOLN Take 3 mLs by nebulization every 4 (four) hours as needed. 07/23/23   Chatterjee, Srobona Tublu, MD  loratadine  (CLARITIN ) 10 MG tablet Take 1 tablet (10 mg total) by mouth daily. 01/03/22     losartan -hydrochlorothiazide  (HYZAAR) 100-12.5 MG tablet Take 1 tablet by mouth daily. 09/03/21   [provider]  metFORMIN  (GLUCOPHAGE ) 500 MG tablet Take 1 tablet (500 mg total) by mouth 2 (two) times daily with a meal. Patient taking differently: Take 500 mg by mouth daily  as needed (high blood sugar). 10/03/21   Rai, Nydia POUR, MD  naloxone  (NARCAN ) nasal spray 4 mg/0.1 mL Place 1 spray into the nose as needed (opiod overdose - respiratory distress). 08/20/21   [provider]  oxyCODONE  (OXYCONTIN ) 10 mg 12 hr tablet Take 10 mg by mouth every 12 (twelve) hours.    [provider]  Oxycodone  HCl 20 MG TABS Take 20 mg by mouth 4 (four) times daily as needed (pain).    [provider]  pantoprazole  (PROTONIX ) 40 MG tablet Take 1 tablet (40 mg total) by mouth daily. 07/02/19   Elgergawy, Brayton RAMAN, MD  prazosin  (MINIPRESS ) 1 MG capsule Take 1 mg by mouth at bedtime.    [provider]  sildenafil  (VIAGRA ) 100 MG tablet Take 1 tablet (100 mg total) by mouth daily as needed. Patient taking differently:  Take 100 mg by mouth daily as needed for erectile dysfunction. 01/20/23     tamsulosin  (FLOMAX ) 0.4 MG CAPS capsule Take 0.4 mg by mouth daily. 04/26/21   [provider]  Tiotropium Bromide  Monohydrate 2.5 MCG/ACT AERS Inhale 2 each into the lungs daily.    [provider]    Physical Exam: Vitals:   08/26/23 1638 08/26/23 1700 08/26/23 1730 08/26/23 1812  BP: (!) 156/105 (!) 149/97 (!) 179/111 (!) 174/116  Pulse: (!) 105 (!) 110 (!) 107 (!) 107  Resp: (!) 22 (!) 32 (!) 30 20  Temp: 98.7 F (37.1 C)   97.7 F (36.5 C)  TempSrc: Oral   Oral  SpO2: 96% 97% 93% 95%  Weight:      Height:       Constitutional: Resting in bed with head elevated, eating dinner.  NAD, calm, comfortable Eyes: EOMI, lids and conjunctivae normal ENMT: Mucous membranes are moist. Posterior pharynx clear of any exudate or lesions.Normal dentition.  Neck: normal, supple, no masses. Respiratory: Distant breath sounds with faint end expiratory wheezing. Normal respiratory effort while on 3 L O2 [place]. No accessory muscle use.  Cardiovascular: Mild tachycardia, no murmurs / rubs / gallops. No extremity edema. 2+ pedal pulses. Abdomen: no  tenderness, no masses palpated. Musculoskeletal: no clubbing / cyanosis. No joint deformity upper and lower extremities. Good ROM, no contractures. Normal muscle tone.  Skin: no rashes, lesions, ulcers. No induration Neurologic: Sensation intact. Strength 5/5 in all 4.  Psychiatric: Normal judgment and insight. Alert and oriented x 3. Normal mood.   EKG: Personally reviewed. Sinus tachycardia, rate 104, no acute ischemic changes.  Assessment/Plan Principal Problem:   COPD with acute exacerbation (HCC) Active Problems:   Acute hypoxic respiratory failure (HCC)   Type 2 diabetes mellitus (HCC)   Hypertension associated with diabetes (HCC)   History of CVA (cerebrovascular accident)   Hyperlipidemia associated with type 2 diabetes mellitus (HCC)   Chronic pain   BPH (benign prostatic hyperplasia)   Depression with anxiety   Glenn [name] is a 63 y.o. male with medical history significant for COPD, sarcoidosis, history of CVA, T2DM, HTN, HLD, BPH, PTSD, depression/anxiety, chronic pain syndrome who is admitted with acute hypoxic respiratory failure due to COPD exacerbation.  Assessment and Plan: Acute respiratory failure with hypoxia due to COPD exacerbation: SpO2 86% on room air on arrival to ED, currently saturating well on 3 L O2 via [place].  Breathing improving with initial treatment in the ED but not quite back to baseline.  CTA chest somewhat limited by breathing motion but no evidence of segmental or large PE. - Start Brovana /Pulmicort  twice daily, DuoNebs as needed - Continue IV Solu-Medrol  40 mg twice daily - Start Z-Pak - IS, FV, Mucinex  - Continue supplemental oxygen  and wean off as able  Type 2 diabetes: Hyperglycemic in setting of steroids.  Patient states that he is allergic to insulin , reportedly caused facial/tongue swelling in the past.  Last hemoglobin A1c 7.5% December 2023.  Continue metformin .  Monitor CBGs.  Hypertension: Resume amlodipine  and prazosin .  History of  CVA: Continue Plavix  and atorvastatin .  Hyperlipidemia: Continue atorvastatin .  BPH: Continue Flomax .  PTSD/depression/anxiety: Continue prazosin .  Chronic pain: Resume home OxyContin  10 mg every 12 hours and oxycodone  20 mg 4 times daily as needed.  PDMP reviewed.   DVT prophylaxis: SCDs Start: 08/26/23 1423  Code Status: Full code Family Communication: Discussed with patient, he has discussed with family Disposition Plan: From home and likely  discharge to home pending clinical progress Consults called: None Severity of Illness: The appropriate patient status for this patient is OBSERVATION. Observation status is judged to be reasonable and necessary in order to provide the required intensity of service to ensure the patient's safety. The patient's presenting symptoms, physical exam findings, and initial radiographic and laboratory data in the context of their medical condition is felt to place them at decreased risk for further clinical deterioration. Furthermore, it is anticipated that the patient will be medically stable for discharge from the hospital within 2 midnights of admission.   Jorie Blanch MD [facility]  If 7PM-7AM, please contact night-coverage [email or web address]  08/26/2023, 7:20 PM

## 2023-08-26 NOTE — ED Notes (Signed)
 Called Kim at [facility] for transport

## 2023-08-26 NOTE — ED Notes (Signed)
 Called Glenn [name] at [facility] to confirm bed ready/transport

## 2023-08-26 NOTE — ED Notes (Signed)
 Called Kimberly at [facility] for transport

## 2023-08-26 NOTE — ED Notes (Signed)
 Trial of O2 taken off resulted in sats of 86%.  Pt placed back on 1L [place] and improved briefly but then declined again.  Pt placed back on 2L [place].

## 2023-08-27 DIAGNOSIS — F4310 Post-traumatic stress disorder, unspecified: Secondary | ICD-10-CM | POA: Diagnosis present

## 2023-08-27 DIAGNOSIS — J441 Chronic obstructive pulmonary disease with (acute) exacerbation: Principal | ICD-10-CM

## 2023-08-27 DIAGNOSIS — E1159 Type 2 diabetes mellitus with other circulatory complications: Secondary | ICD-10-CM | POA: Diagnosis present

## 2023-08-27 DIAGNOSIS — Z7984 Long term (current) use of oral hypoglycemic drugs: Secondary | ICD-10-CM | POA: Diagnosis not present

## 2023-08-27 DIAGNOSIS — J9601 Acute respiratory failure with hypoxia: Secondary | ICD-10-CM

## 2023-08-27 DIAGNOSIS — E785 Hyperlipidemia, unspecified: Secondary | ICD-10-CM | POA: Diagnosis present

## 2023-08-27 DIAGNOSIS — Z888 Allergy status to other drugs, medicaments and biological substances status: Secondary | ICD-10-CM | POA: Diagnosis not present

## 2023-08-27 DIAGNOSIS — Z87891 Personal history of nicotine dependence: Secondary | ICD-10-CM | POA: Diagnosis not present

## 2023-08-27 DIAGNOSIS — E875 Hyperkalemia: Secondary | ICD-10-CM

## 2023-08-27 DIAGNOSIS — E1169 Type 2 diabetes mellitus with other specified complication: Secondary | ICD-10-CM | POA: Diagnosis present

## 2023-08-27 DIAGNOSIS — Z91013 Allergy to seafood: Secondary | ICD-10-CM | POA: Diagnosis not present

## 2023-08-27 DIAGNOSIS — I152 Hypertension secondary to endocrine disorders: Secondary | ICD-10-CM | POA: Diagnosis present

## 2023-08-27 DIAGNOSIS — G8921 Chronic pain due to trauma: Secondary | ICD-10-CM

## 2023-08-27 DIAGNOSIS — Z833 Family history of diabetes mellitus: Secondary | ICD-10-CM | POA: Diagnosis not present

## 2023-08-27 DIAGNOSIS — N400 Enlarged prostate without lower urinary tract symptoms: Secondary | ICD-10-CM | POA: Diagnosis present

## 2023-08-27 DIAGNOSIS — J9621 Acute and chronic respiratory failure with hypoxia: Secondary | ICD-10-CM | POA: Diagnosis present

## 2023-08-27 DIAGNOSIS — F418 Other specified anxiety disorders: Secondary | ICD-10-CM

## 2023-08-27 DIAGNOSIS — F32A Depression, unspecified: Secondary | ICD-10-CM | POA: Diagnosis present

## 2023-08-27 DIAGNOSIS — D869 Sarcoidosis, unspecified: Secondary | ICD-10-CM | POA: Diagnosis present

## 2023-08-27 DIAGNOSIS — Z8249 Family history of ischemic heart disease and other diseases of the circulatory system: Secondary | ICD-10-CM | POA: Diagnosis not present

## 2023-08-27 DIAGNOSIS — F419 Anxiety disorder, unspecified: Secondary | ICD-10-CM | POA: Diagnosis present

## 2023-08-27 DIAGNOSIS — Z1152 Encounter for screening for COVID-19: Secondary | ICD-10-CM | POA: Diagnosis not present

## 2023-08-27 DIAGNOSIS — Z8673 Personal history of transient ischemic attack (TIA), and cerebral infarction without residual deficits: Secondary | ICD-10-CM | POA: Diagnosis not present

## 2023-08-27 DIAGNOSIS — E119 Type 2 diabetes mellitus without complications: Secondary | ICD-10-CM

## 2023-08-27 DIAGNOSIS — G894 Chronic pain syndrome: Secondary | ICD-10-CM | POA: Diagnosis present

## 2023-08-27 DIAGNOSIS — Z886 Allergy status to analgesic agent status: Secondary | ICD-10-CM | POA: Diagnosis not present

## 2023-08-27 DIAGNOSIS — Z7951 Long term (current) use of inhaled steroids: Secondary | ICD-10-CM | POA: Diagnosis not present

## 2023-08-27 DIAGNOSIS — E1165 Type 2 diabetes mellitus with hyperglycemia: Secondary | ICD-10-CM | POA: Diagnosis present

## 2023-08-27 LAB — BASIC METABOLIC PANEL WITH GFR
Anion gap: 8 (ref 5–15)
BUN: 17 mg/dL (ref 8–23)
CO2: 30 mmol/L (ref 22–32)
Calcium: 8.9 mg/dL (ref 8.9–10.3)
Chloride: 93 mmol/L — ABNORMAL LOW (ref 98–111)
Creatinine, Ser: 0.86 mg/dL (ref 0.61–1.24)
GFR, Estimated: >60 mL/min (ref >60)
Glucose, Bld: 229 mg/dL — ABNORMAL HIGH (ref 70–99)
Potassium: 5.7 mmol/L — ABNORMAL HIGH (ref 3.5–5.1)
Sodium: 131 mmol/L — ABNORMAL LOW (ref 135–145)

## 2023-08-27 LAB — CBC
HCT: 37.7 % — ABNORMAL LOW (ref 39.0–52.0)
Hemoglobin: 11.5 g/dL — ABNORMAL LOW (ref 13.0–17.0)
MCH: 25.9 pg — ABNORMAL LOW (ref 26.0–34.0)
MCHC: 30.5 g/dL (ref 30.0–36.0)
MCV: 84.9 fL (ref 80.0–100.0)
Platelets: 418 K/uL — ABNORMAL HIGH (ref 150–400)
RBC: 4.44 MIL/uL (ref 4.22–5.81)
RDW: 14.8 % (ref 11.5–15.5)
WBC: 9 K/uL (ref 4.0–10.5)
nRBC: 0 % (ref 0.0–0.2)

## 2023-08-27 LAB — RESPIRATORY PANEL BY PCR

## 2023-08-27 LAB — MAGNESIUM: Magnesium: 2.3 mg/dL (ref 1.7–2.4)

## 2023-08-27 LAB — POTASSIUM: Potassium: 5.8 mmol/L — ABNORMAL HIGH (ref 3.5–5.1)

## 2023-08-27 LAB — HEMOGLOBIN A1C
Hgb A1c MFr Bld: 7.6 % — ABNORMAL HIGH (ref 4.8–5.6)
Mean Plasma Glucose: 171.42 mg/dL

## 2023-08-27 MED ORDER — ASPIRIN 81 MG PO TBEC
81.0000 mg | DELAYED_RELEASE_TABLET | Freq: Every day | ORAL | Status: DC
  Administered 2023-08-28 – 2023-08-29 (×2): 81 mg via ORAL
  Filled 2023-08-27 (×3): qty 1

## 2023-08-27 MED ORDER — NALOXONE HCL 4 MG/0.1ML NA LIQD: 1.0000 | NASAL | Status: DC | PRN

## 2023-08-27 MED ORDER — EPINEPHRINE 0.3 MG/0.3ML IJ SOAJ: 0.3000 mg | INTRAMUSCULAR | Status: DC | PRN

## 2023-08-27 MED ORDER — PANTOPRAZOLE SODIUM 40 MG PO TBEC
40.0000 mg | DELAYED_RELEASE_TABLET | Freq: Every day | ORAL | Status: DC
  Administered 2023-08-27 – 2023-08-29 (×3): 40 mg via ORAL
  Filled 2023-08-27 (×3): qty 1

## 2023-08-27 MED ORDER — IPRATROPIUM BROMIDE 0.02 % IN SOLN
0.5000 mg | Freq: Three times a day (TID) | RESPIRATORY_TRACT | Status: DC
  Administered 2023-08-27 – 2023-08-28 (×4): 0.5 mg via RESPIRATORY_TRACT
  Filled 2023-08-27 (×4): qty 2.5

## 2023-08-27 MED ORDER — SERTRALINE HCL 50 MG PO TABS
50.0000 mg | ORAL_TABLET | Freq: Every day | ORAL | Status: DC
  Filled 2023-08-27 (×2): qty 1

## 2023-08-27 MED ORDER — SODIUM ZIRCONIUM CYCLOSILICATE 5 G PO PACK
5.0000 g | PACK | Freq: Two times a day (BID) | ORAL | Status: AC
  Administered 2023-08-27 (×2): 5 g via ORAL
  Filled 2023-08-27 (×2): qty 1

## 2023-08-27 MED ORDER — GUAIFENESIN ER 600 MG PO TB12
1200.0000 mg | ORAL_TABLET | Freq: Two times a day (BID) | ORAL | Status: DC
  Administered 2023-08-27 – 2023-08-29 (×5): 1200 mg via ORAL
  Filled 2023-08-27 (×5): qty 2

## 2023-08-27 MED ORDER — FAMOTIDINE 20 MG PO TABS: 20.0000 mg | ORAL_TABLET | Freq: Every day | ORAL | Status: DC | PRN

## 2023-08-27 MED ORDER — FLUTICASONE PROPIONATE 50 MCG/ACT NA SUSP
2.0000 | Freq: Every day | NASAL | Status: DC
  Administered 2023-08-29: 2 via NASAL
  Filled 2023-08-27: qty 16

## 2023-08-27 MED ORDER — SENNOSIDES-DOCUSATE SODIUM 8.6-50 MG PO TABS
1.0000 | ORAL_TABLET | Freq: Two times a day (BID) | ORAL | Status: DC
  Administered 2023-08-27 – 2023-08-29 (×5): 1 via ORAL
  Filled 2023-08-27 (×6): qty 1

## 2023-08-27 MED ORDER — POLYETHYLENE GLYCOL 3350 17 G PO PACK
17.0000 g | PACK | Freq: Every day | ORAL | Status: DC
  Administered 2023-08-27 – 2023-08-28 (×2): 17 g via ORAL
  Filled 2023-08-27 (×3): qty 1

## 2023-08-27 MED ORDER — LEVALBUTEROL HCL 0.63 MG/3ML IN NEBU
0.6300 mg | INHALATION_SOLUTION | Freq: Three times a day (TID) | RESPIRATORY_TRACT | Status: DC
  Administered 2023-08-27 – 2023-08-28 (×4): 0.63 mg via RESPIRATORY_TRACT
  Filled 2023-08-27 (×4): qty 3

## 2023-08-27 MED ORDER — BUDESONIDE 0.5 MG/2ML IN SUSP
0.5000 mg | Freq: Two times a day (BID) | RESPIRATORY_TRACT | Status: DC
  Administered 2023-08-27 – 2023-08-29 (×6): 0.5 mg via RESPIRATORY_TRACT
  Filled 2023-08-27 (×6): qty 2

## 2023-08-27 NOTE — Progress Notes (Addendum)
 PROGRESS NOTE    Glenn [name]  FMW:[id number] DOB: Oct 15, 1960 DOA: 08/26/2023 PCP: [facility]   Chief Complaint  Patient presents with   Shortness of Breath    Brief Narrative:  Glenn [name] is a 63 y.o. male with medical history significant for COPD, sarcoidosis, history of CVA, T2DM, HTN, HLD, BPH, PTSD, depression/anxiety, chronic pain syndrome who is admitted with acute hypoxic respiratory failure due to COPD exacerbation.    Assessment & Plan:   Principal Problem:   COPD with acute exacerbation (HCC) Active Problems:   Acute hypoxic respiratory failure (HCC)   Type 2 diabetes mellitus (HCC)   Hypertension associated with diabetes (HCC)   History of CVA (cerebrovascular accident)   Hyperlipidemia associated with type 2 diabetes mellitus (HCC)   Chronic pain   BPH (benign prostatic hyperplasia)   Depression with anxiety   Hyperkalemia  #1 acute on chronic respiratory failure with hypoxia secondary to acute COPD exacerbation -Patient noted to have sats on admission of 86% on room air on arrival to the ED requiring O2. - Patient did state was post to be on oxygen  but had ran out of oxygen  from his tanks for several weeks now and due to change from Medicaid to the [place] still trying to figure out how to get his O2. - Patient stated increased wheezing and shortness of breath prior to admission and does not feel at baseline. - CT angiogram chest done limited by breathing motion but no evidence of segmental large PE. - Continue IV Solu-Medrol  40 mg twice daily, Brovana , Pulmicort , azithromycin , Mucinex , I-S, FV. - Place on scheduled Atrovent  and Xopenex  nebs, Flonase , Claritin , PPI. - Check ambulatory sats. - TOC to aid with getting home O2. - Will need outpatient follow-up with pulmonary postdischarge.  2.  Diabetes mellitus type 2 -Patient noted to have hyperglycemia in the setting of steroids. - Per admitting physician patient noted to be allergic to insulin ,  reportedly caused facial/tongue swelling in the past. - Hemoglobin A1c 7.6. - CBG 328 last night and currently at 229 on lab work. - Continue metformin .  3.  Hyperkalemia -Lokelma  twice daily x 2 doses. - Repeat labs in AM.  4.  Hypertension -Continue home regimen Norvasc , prazosin , Flomax .  5.  BPH -Flomax .  6.  History of CVA -Continue Plavix  for secondary stroke prophylaxis. - Continue atorvastatin .  7.  Hyperlipidemia -Atorvastatin .  8.  PTSD/depression/anxiety - Resume home regimen Zoloft .  9.  Chronic pain -Continue home regimen of OxyContin  10 mg p.o. twice daily and oxycodone  20 mg 4 times daily as needed.   DVT prophylaxis: SCDs Code Status: Full Family Communication: Updated patient.  No family at bedside. Disposition: Likely home when clinically improved and back to baseline.  Status is: Observation The patient remains OBS appropriate and will d/c before 2 midnights.   Consultants:  None  Procedures:  CT angiogram chest 08/26/2023 Chest x-ray 08/26/2023   Antimicrobials:  Anti-infectives (From admission, onward)    Start     Dose/Rate Route Frequency Ordered Stop   08/27/23 1000  azithromycin  (ZITHROMAX ) tablet 250 mg       Placed in Followed by Linked Group   250 mg Oral Daily 08/26/23 1912 08/31/23 0959   08/26/23 2000  azithromycin  (ZITHROMAX ) tablet 500 mg       Placed in Followed by Linked Group   500 mg Oral Daily 08/26/23 1912 08/27/23 0042         Subjective: Patient laying in bed.  Denies  any chest pain.  Feels some improvement with shortness of breath and wheezing however not at baseline. States had run out of oxygen  at home and supposed to be on 2 L nasal cannula over the past 6 months.  States trying to get his oxygen  tanks filled back up which he is unable to get at this time since he switched from [place] to the [place].  Objective: Vitals:   08/27/23 0824 08/27/23 0828 08/27/23 0831 08/27/23 0835  BP:    128/81  Pulse:    88   Resp:    20  Temp:    97.7 F (36.5 C)  TempSrc:    Oral  SpO2: 98% 100% 100% 100%  Weight:      Height:        Intake/Output Summary (Last 24 hours) at 08/27/2023 1148 Last data filed at 08/27/2023 0800 Gross per 24 hour  Intake 120 ml  Output --  Net 120 ml   Filed Weights   08/26/23 0909  Weight: 65.8 kg    Examination:  General exam: NAD. Respiratory system: Some diffuse fine crackles.  Minimal expiratory wheezing.  No rhonchi.  No use of accessory muscles of respiration.  Speaking in full sentences.  Cardiovascular system: S1 & S2 heard, RRR. No JVD, murmurs, rubs, gallops or clicks. No pedal edema. Gastrointestinal system: Abdomen is nondistended, soft and nontender. No organomegaly or masses felt. Normal bowel sounds heard. Central nervous system: Alert and oriented. No focal neurological deficits. Extremities: Symmetric 5 x 5 power. Skin: No rashes, lesions or ulcers Psychiatry: Judgement and insight appear normal. Mood & affect appropriate.     Data Reviewed: I have personally reviewed following labs and imaging studies  CBC: Recent Labs  Lab 08/26/23 0949 08/27/23 0408  WBC 11.7* 9.0  HGB 12.3* 11.5*  HCT 38.9* 37.7*  MCV 83.3 84.9  PLT 446* 418*    Basic Metabolic Panel: Recent Labs  Lab 08/26/23 0949 08/27/23 0408  NA 136 131*  K 4.3 5.8*  5.7*  CL 97* 93*  CO2 29 30  GLUCOSE 97 229*  BUN 10 17  CREATININE 0.88 0.86  CALCIUM  10.0 8.9  MG  --  2.3    GFR: Estimated Creatinine Clearance: 76.5 mL/min (by C-G formula based on SCr of 0.86 mg/dL).  Liver Function Tests: No results for input(s): AST, ALT, ALKPHOS, BILITOT, PROT, ALBUMIN in the last 168 hours.  CBG: Recent Labs  Lab 08/26/23 1808  GLUCAP 328*     Recent Results (from the past 240 hours)  Resp panel by RT-PCR (RSV, Flu A&B, Covid) Anterior Nasal Swab     Status: None   Collection Time: 08/26/23  9:49 AM   Specimen: Anterior Nasal Swab  Result Value Ref  Range Status   SARS Coronavirus 2 by RT PCR NEGATIVE NEGATIVE Final    Comment: (NOTE) SARS-CoV-2 target nucleic acids are NOT DETECTED.  The SARS-CoV-2 RNA is generally detectable in upper respiratory specimens during the acute phase of infection. The lowest concentration of SARS-CoV-2 viral copies this assay can detect is 138 copies/mL. A negative result does not preclude SARS-Cov-2 infection and should not be used as the sole basis for treatment or other patient management decisions. A negative result may occur with  improper specimen collection/handling, submission of specimen other than nasopharyngeal swab, presence of viral mutation(s) within the areas targeted by this assay, and inadequate number of viral copies(<138 copies/mL). A negative result must be combined with clinical observations, patient history, and epidemiological  information. The expected result is Negative.  Fact Sheet for Patients:  BloggerCourse.com  Fact Sheet for Healthcare Providers:  SeriousBroker.it  This test is no t yet approved or cleared by the [place]  FDA and  has been authorized for detection and/or diagnosis of SARS-CoV-2 by FDA under an Emergency Use Authorization (EUA). This EUA will remain  in effect (meaning this test can be used) for the duration of the COVID-19 declaration under Section 564(b)(1) of the Act, 21 U.S.C.section 360bbb-3(b)(1), unless the authorization is terminated  or revoked sooner.       Influenza A by PCR NEGATIVE NEGATIVE Final   Influenza B by PCR NEGATIVE NEGATIVE Final    Comment: (NOTE) The Xpert Xpress SARS-CoV-2/FLU/RSV plus assay is intended as an aid in the diagnosis of influenza from Nasopharyngeal swab specimens and should not be used as a sole basis for treatment. Nasal washings and aspirates are unacceptable for Xpert Xpress SARS-CoV-2/FLU/RSV testing.  Fact Sheet for  Patients: BloggerCourse.com  Fact Sheet for Healthcare Providers: SeriousBroker.it  This test is not yet approved or cleared by the [place]  FDA and has been authorized for detection and/or diagnosis of SARS-CoV-2 by FDA under an Emergency Use Authorization (EUA). This EUA will remain in effect (meaning this test can be used) for the duration of the COVID-19 declaration under Section 564(b)(1) of the Act, 21 U.S.C. section 360bbb-3(b)(1), unless the authorization is terminated or revoked.     Resp Syncytial Virus by PCR NEGATIVE NEGATIVE Final    Comment: (NOTE) Fact Sheet for Patients: BloggerCourse.com  Fact Sheet for Healthcare Providers: SeriousBroker.it  This test is not yet approved or cleared by the [place]  FDA and has been authorized for detection and/or diagnosis of SARS-CoV-2 by FDA under an Emergency Use Authorization (EUA). This EUA will remain in effect (meaning this test can be used) for the duration of the COVID-19 declaration under Section 564(b)(1) of the Act, 21 U.S.C. section 360bbb-3(b)(1), unless the authorization is terminated or revoked.  Performed at [facility], [address], [place], [place] [address]          Radiology Studies: CT Angio Chest PE W and/or Wo Contrast Result Date: 08/26/2023 CLINICAL DATA:  Dyspnea. COPD. Sarcoid. On treatment for pneumonia. EXAM: CT ANGIOGRAPHY CHEST WITH CONTRAST TECHNIQUE: Multidetector CT imaging of the chest was performed using the standard protocol during bolus administration of intravenous contrast. Multiplanar CT image reconstructions and MIPs were obtained to evaluate the vascular anatomy. RADIATION DOSE REDUCTION: This exam was performed according to the departmental dose-optimization program which includes automated exposure control, adjustment of the mA and/or kV according to  patient size and/or use of iterative reconstruction technique. CONTRAST:  75mL OMNIPAQUE  IOHEXOL  350 MG/ML SOLN COMPARISON:  Chest x-ray 08/26/2023. Noncontrast CT 10/19/2022. CTA 10/13/2022. FINDINGS: Cardiovascular: No segmental or larger pulmonary embolism identified. Some breathing motion identified which can limit evaluation of small peripheral emboli. Borderline size heart. Trace pericardial fluid. The thoracic aorta is normal course and caliber with some mild partially calcified atherosclerotic plaque. Coronary artery calcifications are seen. Please correlate for other coronary risk factors. Mediastinum/Nodes: No specific abnormal lymph node enlargement identified in the axillary region hila. A few small less than 1 cm size mediastinal nodes are identified, not pathologic by size criteria. This includes stable lymph node pre cardiac on series 4, image 78 with short axis of 6 mm. Slightly patulous thoracic esophagus. Preserved thyroid gland. Lungs/Pleura: Again there are postsurgical changes seen in both lungs with suture. There is  diffuse bronchial wall thickening throughout the tracheobronchial tree with some areas of bronchial narrowing such as right lower lobe and left lower lobe. Please correlate history of sarcoidosis. There is some ground-glass and interstitial opacities identified in the left lower lobe greater than right lower lobe. Please correlate for history of treatment for pneumonia. Underlying emphysematous lung changes are identified. No significant pleural effusion. No frank consolidation. Apical pleural thickening. Upper Abdomen: No acute abnormality. Musculoskeletal: Scattered degenerative changes along the spine. Review of the MIP images confirms the above findings. IMPRESSION: There is some breathing motion which can limit evaluation of small and peripheral emboli. No segmental or larger pulmonary embolism identified at this time. Extensive bronchial wall thickening with distortion and  narrowing, similar to previous. Please correlate with known history. There is also underlying emphysematous lung changes. Interstitial and some ground-glass changes identified along the lung bases bilaterally. Please correlate with history of treatment for pneumonia and recommend follow-up. Aortic Atherosclerosis (ICD10-I70.0) and Emphysema (ICD10-J43.9). Electronically Signed   By: Ranell Bring M.D.   On: 08/26/2023 12:06   DG Chest Portable 1 View Result Date: 08/26/2023 CLINICAL DATA:  COPD and shortness of breath. EXAM: PORTABLE CHEST 1 VIEW COMPARISON:  07/19/2023 FINDINGS: Heart size and mediastinal contours are unremarkable. There are low lung volumes with asymmetric elevation of the left hemidiaphragm, unchanged from the prior exam. No significant pleural effusion or signs of pneumothorax. Increase interstitial markings are noted bilaterally consistent with chronic scarring and architectural distortion. Persistent ill-defined hazy opacity within the perihilar right lung. Visualized osseous structures are unremarkable. IMPRESSION: 1. Low lung volumes with asymmetric elevation of the left hemidiaphragm. 2. Chronic scarring and architectural distortion. 3. Persistent ill-defined hazy opacity within the perihilar right lung. Electronically Signed   By: Waddell Calk M.D.   On: 08/26/2023 10:43        Scheduled Meds:  amLODipine   10 mg Oral Daily   arformoterol   15 mcg Nebulization BID   atorvastatin   40 mg Oral Daily   azithromycin   250 mg Oral Daily   budesonide  (PULMICORT ) nebulizer solution  0.5 mg Nebulization BID   clopidogrel   75 mg Oral Daily   fluticasone   2 spray Each Nare Daily   guaiFENesin   1,200 mg Oral BID   ipratropium  0.5 mg Nebulization Q8H   levalbuterol   0.63 mg Nebulization Q8H   loratadine   10 mg Oral Daily   [START ON 08/28/2023] metFORMIN   500 mg Oral BID WC   methylPREDNISolone  (SOLU-MEDROL ) injection  40 mg Intravenous Q12H   oxyCODONE   10 mg Oral Q12H    pantoprazole   40 mg Oral Q0600   polyethylene glycol  17 g Oral Daily   prazosin   1 mg Oral QHS   senna-docusate  1 tablet Oral BID   tamsulosin   0.4 mg Oral Daily   Continuous Infusions:   LOS: 0 days    Time spent: 40 minutes    Toribio Hummer, MD [facility]   To contact the attending provider between 7A-7P or the covering provider during after hours 7P-7A, please log into the web site [email or web address] and access using universal [place] password for that web site. If you do not have the password, please call the hospital operator.  08/27/2023, 11:48 AM

## 2023-08-27 NOTE — Plan of Care (Signed)

## 2023-08-27 NOTE — Progress Notes (Signed)
 Mobility Specialist - Progress Note  ([place] 2L) Pre-mobility: 101 bpm HR, 92% SpO2 During mobility: 119 bpm HR, 87% SpO2 Post-mobility: 110 bpm HR, 90% SPO2   08/27/23 1544  Mobility  Activity Ambulated independently in hallway  Level of Assistance Independent  Assistive Device None  Distance Ambulated (ft) 350 ft  Range of Motion/Exercises Active  Activity Response Tolerated well  Mobility Referral Yes  Mobility visit 1 Mobility  Mobility Specialist Start Time (ACUTE ONLY) 1530  Mobility Specialist Stop Time (ACUTE ONLY) 1544  Mobility Specialist Time Calculation (min) (ACUTE ONLY) 14 min   Pt was found in room and agreeable to ambulate. No complaints during session. SPO2 >90% during ambulation. Desat to 87% upon returning to room. Able to increase to 90% within 1 min. Was left with all needs met. Call bell in reach. RN notified.  Erminio Leos,  Mobility Specialist Can be reached via Secure Chat

## 2023-08-27 NOTE — Progress Notes (Signed)
 Medications were not given according to [facility] schedule. Pt request medications be administered  on alternate timeline.

## 2023-08-27 NOTE — Progress Notes (Signed)
   08/27/23 0732  TOC Brief Assessment  Insurance and Status Reviewed  Patient has primary care physician Yes ([facility])  Home environment has been reviewed From home/married  Prior level of function: Independent  Prior/Current Home Services No current home services  Social Drivers of Health Review SDOH reviewed no interventions necessary  Readmission risk has been reviewed Yes  Transition of care needs no transition of care needs at this time       Transition of Care Department [facility]) has reviewed patient and no TOC needs have been identified at this time. We will continue to monitor patient advancement through interdisciplinary progression rounds. If new patient transition needs arise, please place a TOC consult.

## 2023-08-27 NOTE — Progress Notes (Signed)
 Received report from [employer], Charity fundraiser. Agree with assessment and will continue current plan of care.

## 2023-08-28 DIAGNOSIS — N400 Enlarged prostate without lower urinary tract symptoms: Secondary | ICD-10-CM | POA: Diagnosis not present

## 2023-08-28 DIAGNOSIS — E875 Hyperkalemia: Secondary | ICD-10-CM | POA: Diagnosis not present

## 2023-08-28 DIAGNOSIS — J441 Chronic obstructive pulmonary disease with (acute) exacerbation: Secondary | ICD-10-CM | POA: Diagnosis not present

## 2023-08-28 DIAGNOSIS — E1159 Type 2 diabetes mellitus with other circulatory complications: Secondary | ICD-10-CM | POA: Diagnosis not present

## 2023-08-28 LAB — CBC WITH DIFFERENTIAL/PLATELET
Abs Immature Granulocytes: 0.1 K/uL — ABNORMAL HIGH (ref 0.00–0.07)
Basophils Absolute: 0 K/uL (ref 0.0–0.1)
Basophils Relative: 0 %
Eosinophils Absolute: 0 K/uL (ref 0.0–0.5)
Eosinophils Relative: 0 %
HCT: 36.9 % — ABNORMAL LOW (ref 39.0–52.0)
Hemoglobin: 11.1 g/dL — ABNORMAL LOW (ref 13.0–17.0)
Immature Granulocytes: 1 %
Lymphocytes Relative: 9 %
Lymphs Abs: 0.9 K/uL (ref 0.7–4.0)
MCH: 26.1 pg (ref 26.0–34.0)
MCHC: 30.1 g/dL (ref 30.0–36.0)
MCV: 86.6 fL (ref 80.0–100.0)
Monocytes Absolute: 0.3 K/uL (ref 0.1–1.0)
Monocytes Relative: 3 %
Neutro Abs: 8.8 K/uL — ABNORMAL HIGH (ref 1.7–7.7)
Neutrophils Relative %: 87 %
Platelets: 396 K/uL (ref 150–400)
RBC: 4.26 MIL/uL (ref 4.22–5.81)
RDW: 14.7 % (ref 11.5–15.5)
WBC: 10 K/uL (ref 4.0–10.5)
nRBC: 0 % (ref 0.0–0.2)

## 2023-08-28 LAB — BASIC METABOLIC PANEL WITH GFR
Anion gap: 8 (ref 5–15)
BUN: 17 mg/dL (ref 8–23)
CO2: 33 mmol/L — ABNORMAL HIGH (ref 22–32)
Calcium: 9.1 mg/dL (ref 8.9–10.3)
Chloride: 94 mmol/L — ABNORMAL LOW (ref 98–111)
Creatinine, Ser: 0.72 mg/dL (ref 0.61–1.24)
GFR, Estimated: >60 mL/min (ref >60)
Glucose, Bld: 218 mg/dL — ABNORMAL HIGH (ref 70–99)
Potassium: 5.2 mmol/L — ABNORMAL HIGH (ref 3.5–5.1)
Sodium: 135 mmol/L (ref 135–145)

## 2023-08-28 LAB — GLUCOSE, CAPILLARY
Glucose-Capillary: 155 mg/dL — ABNORMAL HIGH (ref 70–99)
Glucose-Capillary: 187 mg/dL — ABNORMAL HIGH (ref 70–99)
Glucose-Capillary: 206 mg/dL — ABNORMAL HIGH (ref 70–99)
Glucose-Capillary: 202 mg/dL — ABNORMAL HIGH (ref 70–99)

## 2023-08-28 MED ORDER — IPRATROPIUM BROMIDE 0.02 % IN SOLN
0.5000 mg | Freq: Three times a day (TID) | RESPIRATORY_TRACT | Status: DC
  Administered 2023-08-28 – 2023-08-29 (×5): 0.5 mg via RESPIRATORY_TRACT
  Filled 2023-08-28 (×5): qty 2.5

## 2023-08-28 MED ORDER — SODIUM ZIRCONIUM CYCLOSILICATE 5 G PO PACK
5.0000 g | PACK | Freq: Two times a day (BID) | ORAL | Status: AC
  Administered 2023-08-28 (×2): 5 g via ORAL
  Filled 2023-08-28 (×2): qty 1

## 2023-08-28 MED ORDER — LEVALBUTEROL HCL 0.63 MG/3ML IN NEBU
0.6300 mg | INHALATION_SOLUTION | Freq: Three times a day (TID) | RESPIRATORY_TRACT | Status: DC
  Administered 2023-08-28 – 2023-08-29 (×5): 0.63 mg via RESPIRATORY_TRACT
  Filled 2023-08-28 (×5): qty 3

## 2023-08-28 MED ORDER — METHYLPREDNISOLONE SODIUM SUCC 40 MG IJ SOLR
40.0000 mg | Freq: Every day | INTRAMUSCULAR | Status: DC
  Administered 2023-08-29: 40 mg via INTRAVENOUS
  Filled 2023-08-28: qty 1

## 2023-08-28 MED ORDER — SERTRALINE HCL 50 MG PO TABS
50.0000 mg | ORAL_TABLET | Freq: Every day | ORAL | Status: DC
  Filled 2023-08-28: qty 1

## 2023-08-28 NOTE — Progress Notes (Signed)
 PROGRESS NOTE    Glenn [name]  FMW:[id number] DOB: Sep 01, 1960 DOA: 08/26/2023 PCP: [facility]   Chief Complaint  Patient presents with   Shortness of Breath    Brief Narrative:  Glenn [name] is a 63 y.o. male with medical history significant for COPD, sarcoidosis, history of CVA, T2DM, HTN, HLD, BPH, PTSD, depression/anxiety, chronic pain syndrome who is admitted with acute hypoxic respiratory failure due to COPD exacerbation.    Assessment & Plan:   Principal Problem:   COPD with acute exacerbation (HCC) Active Problems:   Acute hypoxic respiratory failure (HCC)   Type 2 diabetes mellitus (HCC)   Hypertension associated with diabetes (HCC)   History of CVA (cerebrovascular accident)   Hyperlipidemia associated with type 2 diabetes mellitus (HCC)   Chronic pain   BPH (benign prostatic hyperplasia)   Depression with anxiety   Hyperkalemia  #1 acute on chronic respiratory failure with hypoxia secondary to acute COPD exacerbation -Patient noted to have sats on admission of 86% on room air on arrival to the ED requiring O2. - Patient did state was supposed to be on oxygen  but had ran out of oxygen  from his tanks for several weeks now and due to change from Medicaid to the [place] still trying to figure out how to get his O2. - Patient stated increased wheezing and shortness of breath prior to admission and does not feel at baseline but has been improving. - CT angiogram chest done limited by breathing motion but no evidence of segmental large PE. - Continue Brovana , Pulmicort , azithromycin , Mucinex , I-S, FV. -Decrease IV Solu-Medrol  to 40 mg daily and transition to oral prednisone  taper tomorrow. - Continue scheduled Atrovent  and Xopenex  nebs, Flonase , Claritin , PPI. - TOC to aid with getting home O2. - Will need outpatient follow-up with pulmonary postdischarge.  2.  Diabetes mellitus type 2 -Patient noted to have hyperglycemia in the setting of steroids. - Per  admitting physician patient noted to be allergic to insulin , reportedly caused facial/tongue swelling in the past. - Hemoglobin A1c 7.6. - CBG 155 this morning.   - Continue metformin .  3.  Hyperkalemia - Potassium currently at 5.2.   - Lokelma  twice daily x 2 doses.   - Repeat labs in the AM.    4.  Hypertension -Continue home regimen Norvasc , prazosin , Flomax .  5.  BPH - Flomax .    6.  History of CVA -Continue Plavix  for secondary stroke prophylaxis. - Continue atorvastatin .  7.  Hyperlipidemia -Atorvastatin .  8.  PTSD/depression/anxiety - Continue home regimen Zoloft .    9.  Chronic pain -Continue home regimen of OxyContin  10 mg p.o. twice daily and oxycodone  20 mg 4 times daily as needed.   DVT prophylaxis: SCDs Code Status: Full Family Communication: Updated patient.  No family at bedside. Disposition: Likely home when clinically improved and back to baseline hopefully in the next 24 hours.  Status is: Inpatient    Consultants:  None  Procedures:  CT angiogram chest 08/26/2023 Chest x-ray 08/26/2023   Antimicrobials:  Anti-infectives (From admission, onward)    Start     Dose/Rate Route Frequency Ordered Stop   08/27/23 1000  azithromycin  (ZITHROMAX ) tablet 250 mg       Placed in Followed by Linked Group   250 mg Oral Daily 08/26/23 1912 08/31/23 0959   08/26/23 2000  azithromycin  (ZITHROMAX ) tablet 500 mg       Placed in Followed by Linked Group   500 mg Oral Daily 08/26/23 1912 08/27/23  9957         Subjective: Sitting up at the side of the bed with oxygen  on.  Feels shortness of breath and wheezing has improved since admission.  Denies any chest pain.  Noted to have ambulated in hallway.   Objective: Vitals:   08/27/23 2120 08/28/23 0621 08/28/23 0745 08/28/23 0912  BP: (!) 141/85 (!) 134/90    Pulse: 96 82    Resp: 14 14    Temp: 97.7 F (36.5 C) 97.6 F (36.4 C)    TempSrc: Oral Oral    SpO2: 96% 94% 100% (!) 79%  Weight:       Height:        Intake/Output Summary (Last 24 hours) at 08/28/2023 1045 Last data filed at 08/27/2023 1818 Gross per 24 hour  Intake 480 ml  Output --  Net 480 ml   Filed Weights   08/26/23 0909  Weight: 65.8 kg    Examination:  General exam: NAD. Respiratory system: Decreased diffuse fine crackles.  Minimal expiratory wheezing.  No rhonchi.  No use of accessory muscles of respiration.  Cardiovascular system: Regular rate rhythm no murmurs rubs or gallops.  No JVD.  No lower extremity edema.  Gastrointestinal system: Abdomen is soft, nontender, nondistended, positive bowel sounds.  No rebound.  No guarding. Central nervous system: Alert and oriented. No focal neurological deficits. Extremities: Symmetric 5 x 5 power. Skin: No rashes, lesions or ulcers Psychiatry: Judgement and insight appear normal. Mood & affect appropriate.     Data Reviewed: I have personally reviewed following labs and imaging studies  CBC: Recent Labs  Lab 08/26/23 0949 08/27/23 0408 08/28/23 0348  WBC 11.7* 9.0 10.0  NEUTROABS  --   --  8.8*  HGB 12.3* 11.5* 11.1*  HCT 38.9* 37.7* 36.9*  MCV 83.3 84.9 86.6  PLT 446* 418* 396    Basic Metabolic Panel: Recent Labs  Lab 08/26/23 0949 08/27/23 0408 08/28/23 0348  NA 136 131* 135  K 4.3 5.8*  5.7* 5.2*  CL 97* 93* 94*  CO2 29 30 33*  GLUCOSE 97 229* 218*  BUN 10 17 17   CREATININE 0.88 0.86 0.72  CALCIUM  10.0 8.9 9.1  MG  --  2.3  --     GFR: Estimated Creatinine Clearance: 82.2 mL/min (by C-G formula based on SCr of 0.72 mg/dL).  Liver Function Tests: No results for input(s): AST, ALT, ALKPHOS, BILITOT, PROT, ALBUMIN in the last 168 hours.  CBG: Recent Labs  Lab 08/26/23 1808 08/28/23 0811  GLUCAP 328* 155*     Recent Results (from the past 240 hours)  Resp panel by RT-PCR (RSV, Flu A&B, Covid) Anterior Nasal Swab     Status: None   Collection Time: 08/26/23  9:49 AM   Specimen: Anterior Nasal Swab  Result  Value Ref Range Status   SARS Coronavirus 2 by RT PCR NEGATIVE NEGATIVE Final    Comment: (NOTE) SARS-CoV-2 target nucleic acids are NOT DETECTED.  The SARS-CoV-2 RNA is generally detectable in upper respiratory specimens during the acute phase of infection. The lowest concentration of SARS-CoV-2 viral copies this assay can detect is 138 copies/mL. A negative result does not preclude SARS-Cov-2 infection and should not be used as the sole basis for treatment or other patient management decisions. A negative result may occur with  improper specimen collection/handling, submission of specimen other than nasopharyngeal swab, presence of viral mutation(s) within the areas targeted by this assay, and inadequate number of viral copies(<138 copies/mL). A  negative result must be combined with clinical observations, patient history, and epidemiological information. The expected result is Negative.  Fact Sheet for Patients:  BloggerCourse.com  Fact Sheet for Healthcare Providers:  SeriousBroker.it  This test is no t yet approved or cleared by the [place]  FDA and  has been authorized for detection and/or diagnosis of SARS-CoV-2 by FDA under an Emergency Use Authorization (EUA). This EUA will remain  in effect (meaning this test can be used) for the duration of the COVID-19 declaration under Section 564(b)(1) of the Act, 21 U.S.C.section 360bbb-3(b)(1), unless the authorization is terminated  or revoked sooner.       Influenza A by PCR NEGATIVE NEGATIVE Final   Influenza B by PCR NEGATIVE NEGATIVE Final    Comment: (NOTE) The Xpert Xpress SARS-CoV-2/FLU/RSV plus assay is intended as an aid in the diagnosis of influenza from Nasopharyngeal swab specimens and should not be used as a sole basis for treatment. Nasal washings and aspirates are unacceptable for Xpert Xpress SARS-CoV-2/FLU/RSV testing.  Fact Sheet for  Patients: BloggerCourse.com  Fact Sheet for Healthcare Providers: SeriousBroker.it  This test is not yet approved or cleared by the [place]  FDA and has been authorized for detection and/or diagnosis of SARS-CoV-2 by FDA under an Emergency Use Authorization (EUA). This EUA will remain in effect (meaning this test can be used) for the duration of the COVID-19 declaration under Section 564(b)(1) of the Act, 21 U.S.C. section 360bbb-3(b)(1), unless the authorization is terminated or revoked.     Resp Syncytial Virus by PCR NEGATIVE NEGATIVE Final    Comment: (NOTE) Fact Sheet for Patients: BloggerCourse.com  Fact Sheet for Healthcare Providers: SeriousBroker.it  This test is not yet approved or cleared by the [place]  FDA and has been authorized for detection and/or diagnosis of SARS-CoV-2 by FDA under an Emergency Use Authorization (EUA). This EUA will remain in effect (meaning this test can be used) for the duration of the COVID-19 declaration under Section 564(b)(1) of the Act, 21 U.S.C. section 360bbb-3(b)(1), unless the authorization is terminated or revoked.  Performed at [facility], [address] [place], [place] [address]   Respiratory (~20 pathogens) panel by PCR     Status: None   Collection Time: 08/27/23  9:30 AM   Specimen: Nasopharyngeal Swab; Respiratory  Result Value Ref Range Status   Adenovirus NOT DETECTED NOT DETECTED Final   Coronavirus 229E NOT DETECTED NOT DETECTED Final    Comment: (NOTE) The Coronavirus on the Respiratory Panel, DOES NOT test for the novel  Coronavirus (2019 nCoV)    Coronavirus HKU1 NOT DETECTED NOT DETECTED Final   Coronavirus NL63 NOT DETECTED NOT DETECTED Final   Coronavirus OC43 NOT DETECTED NOT DETECTED Final   Metapneumovirus NOT DETECTED NOT DETECTED Final   Rhinovirus / Enterovirus NOT  DETECTED NOT DETECTED Final   Influenza A NOT DETECTED NOT DETECTED Final   Influenza B NOT DETECTED NOT DETECTED Final   Parainfluenza Virus 1 NOT DETECTED NOT DETECTED Final   Parainfluenza Virus 2 NOT DETECTED NOT DETECTED Final   Parainfluenza Virus 3 NOT DETECTED NOT DETECTED Final   Parainfluenza Virus 4 NOT DETECTED NOT DETECTED Final   Respiratory Syncytial Virus NOT DETECTED NOT DETECTED Final   Bordetella pertussis NOT DETECTED NOT DETECTED Final   Bordetella Parapertussis NOT DETECTED NOT DETECTED Final   Chlamydophila pneumoniae NOT DETECTED NOT DETECTED Final   Mycoplasma pneumoniae NOT DETECTED NOT DETECTED Final    Comment: Performed at [facility], [address] [place], [place] [address]         Radiology Studies: CT Angio Chest PE W and/or Wo Contrast Result Date: 08/26/2023 CLINICAL DATA:  Dyspnea. COPD. Sarcoid. On treatment for pneumonia. EXAM: CT ANGIOGRAPHY CHEST WITH CONTRAST TECHNIQUE: Multidetector CT imaging of the chest was performed using the standard protocol during bolus administration of intravenous contrast. Multiplanar CT image reconstructions and MIPs were obtained to evaluate the vascular anatomy. RADIATION DOSE REDUCTION: This exam was performed according to the departmental dose-optimization program which includes automated exposure control, adjustment of the mA and/or kV according to patient size and/or use of iterative reconstruction technique. CONTRAST:  75mL OMNIPAQUE  IOHEXOL  350 MG/ML SOLN COMPARISON:  Chest x-ray 08/26/2023. Noncontrast CT 10/19/2022. CTA 10/13/2022. FINDINGS: Cardiovascular: No segmental or larger pulmonary embolism identified. Some breathing motion identified which can limit evaluation of small peripheral emboli. Borderline size heart. Trace pericardial fluid. The thoracic aorta is normal course and caliber with some mild partially calcified atherosclerotic plaque. Coronary artery calcifications are seen. Please correlate  for other coronary risk factors. Mediastinum/Nodes: No specific abnormal lymph node enlargement identified in the axillary region hila. A few small less than 1 cm size mediastinal nodes are identified, not pathologic by size criteria. This includes stable lymph node pre cardiac on series 4, image 78 with short axis of 6 mm. Slightly patulous thoracic esophagus. Preserved thyroid gland. Lungs/Pleura: Again there are postsurgical changes seen in both lungs with suture. There is diffuse bronchial wall thickening throughout the tracheobronchial tree with some areas of bronchial narrowing such as right lower lobe and left lower lobe. Please correlate history of sarcoidosis. There is some ground-glass and interstitial opacities identified in the left lower lobe greater than right lower lobe. Please correlate for history of treatment for pneumonia. Underlying emphysematous lung changes are identified. No significant pleural effusion. No frank consolidation. Apical pleural thickening. Upper Abdomen: No acute abnormality. Musculoskeletal: Scattered degenerative changes along the spine. Review of the MIP images confirms the above findings. IMPRESSION: There is some breathing motion which can limit evaluation of small and peripheral emboli. No segmental or larger pulmonary embolism identified at this time. Extensive bronchial wall thickening with distortion and narrowing, similar to previous. Please correlate with known history. There is also underlying emphysematous lung changes. Interstitial and some ground-glass changes identified along the lung bases bilaterally. Please correlate with history of treatment for pneumonia and recommend follow-up. Aortic Atherosclerosis (ICD10-I70.0) and Emphysema (ICD10-J43.9). Electronically Signed   By: Ranell Bring M.D.   On: 08/26/2023 12:06        Scheduled Meds:  amLODipine   10 mg Oral Daily   arformoterol   15 mcg Nebulization BID   aspirin  EC  81 mg Oral Daily   atorvastatin    40 mg Oral Daily   azithromycin   250 mg Oral Daily   budesonide  (PULMICORT ) nebulizer solution  0.5 mg Nebulization BID   clopidogrel   75 mg Oral Daily   fluticasone   2 spray Each Nare Daily   guaiFENesin   1,200 mg Oral BID   ipratropium  0.5 mg Nebulization TID   levalbuterol   0.63 mg Nebulization TID   loratadine   10 mg Oral Daily   metFORMIN   500 mg Oral BID WC   methylPREDNISolone  (SOLU-MEDROL ) injection  40 mg Intravenous Q12H   oxyCODONE   10 mg Oral Q12H   pantoprazole   40 mg Oral Q0600   polyethylene glycol  17 g Oral Daily   prazosin   1 mg Oral QHS   senna-docusate  1 tablet  Oral BID   sertraline   50 mg Oral QHS   sodium zirconium cyclosilicate   5 g Oral BID   tamsulosin   0.4 mg Oral Daily   Continuous Infusions:   LOS: 1 day    Time spent: 40 minutes    Toribio Hummer, MD [facility]   To contact the attending provider between 7A-7P or the covering provider during after hours 7P-7A, please log into the web site [email or web address] and access using universal [place] password for that web site. If you do not have the password, please call the hospital operator.  08/28/2023, 10:45 AM

## 2023-08-28 NOTE — TOC Initial Note (Signed)
 Transition of Care [facility]) - Initial/Assessment Note   Patient Details  Name: Glenn [name] MRN: [id number] Date of Birth: [birth year]  Transition of Care [facility]) CM/SW Contact:    Duwaine GORMAN Aran, LCSW Phone Number: 08/28/2023, 11:58 AM  Clinical Narrative: Care management consulted as patient will need home oxygen . CSW met with patient to discuss home oxygen . Per patient, he was getting home oxygen  through his Medicaid which he no longer has and will need to get oxygen  through the [place].  CSW completed home oxygen  discharge qualification form with clinical documentation, which was emailed to [email or web address] and faxed to the [facility] respiratory department [phone]).  Expected Discharge Plan: Home/Self Care Barriers to Discharge: Continued Medical Work up  Patient Goals and CMS Choice Patient states their goals for this hospitalization and ongoing recovery are:: Get home oxygen  set up through the [place] Choice offered to / list presented to : NA  Expected Discharge Plan and Services In-house Referral: Clinical Social Work Post Acute Care Choice: Durable Medical Equipment Living arrangements for the past 2 months: Apartment           DME Arranged: Oxygen  DME Agency: Other - Comment [facility])  Prior Living Arrangements/Services Living arrangements for the past 2 months: Apartment Lives with:: Spouse Patient language and need for interpreter reviewed:: Yes Do you feel safe going back to the place where you live?: Yes      Need for Family Participation in Patient Care: No (Comment) Care giver support system in place?: Yes (comment) Criminal Activity/Legal Involvement Pertinent to Current Situation/Hospitalization: No - Comment as needed  Activities of Daily Living ADL Screening (condition at time of admission) Independently performs ADLs?: Yes (appropriate for developmental age) Is the patient deaf or have difficulty hearing?: No Does the patient have difficulty seeing, even  when wearing glasses/contacts?: No Does the patient have difficulty concentrating, remembering, or making decisions?: No  Permission Sought/Granted Permission sought to share information with : Facility Industrial/product designer granted to share information with : Yes, Verbal Permission Granted Permission granted to share info w AGENCY: [facility] D[facility] department  Emotional Assessment Appearance:: Appears stated age Attitude/Demeanor/Rapport: Engaged Affect (typically observed): Accepting Orientation: : Oriented to Self, Oriented to Place, Oriented to  Time, Oriented to Situation Alcohol / Substance Use: Not Applicable Psych Involvement: No (comment)  Admission diagnosis:  COPD exacerbation (HCC) [J44.1] COPD with acute exacerbation (HCC) [J44.1] Patient Active Problem List   Diagnosis Date Noted   Hyperkalemia 08/27/2023   Depression with anxiety 08/26/2023   Acute hypoxic respiratory failure (HCC) 07/19/2023   Viral pneumonia 01/11/2022   Influenza A 01/10/2022   CAP (community acquired pneumonia) 01/10/2022   BPH (benign prostatic hyperplasia) 01/10/2022   Sore throat 11/22/2021   Acute respiratory failure with hypoxia (HCC) 10/01/2021   History of CVA (cerebrovascular accident) 09/30/2021   Hyperlipidemia associated with type 2 diabetes mellitus (HCC) 09/30/2021   Personal history of sarcoidosis 09/30/2021   Asthma exacerbation 05/23/2021   Acute CVA (cerebrovascular accident) (HCC) 06/23/2020   Daytime sleepiness 06/09/2020   Malnutrition of moderate degree 05/07/2020   Chronic pain 05/06/2020   Tobacco dependence 05/06/2020   Hypertension associated with diabetes (HCC)    Type 2 diabetes mellitus (HCC)    COPD with acute exacerbation (HCC) 06/30/2019   Near syncope 06/30/2019   Pleural nodule 06/30/2019   Colon cancer screening 12/18/2017   History of colonic polyps 12/18/2017   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   COPD GOLD  II 10/05/2016   Tinnitus of  left ear 09/13/2016   PCP:  [facility] Pharmacy:   [facility] [phone] - [place], [place] - [address] AT [facility] [address] [place] [address] Phone: [phone] Fax: [phone]  [id number] [place], [place] - [address] AT [facility] [address] [place] [address] Phone: [phone] Fax: [phone]  [facility] - [place], [place] - [address] [facility] [address] [place] [address] Phone: [phone] Fax: [phone]  Social Drivers of Health (SDOH) Social History: SDOH Screenings   Food Insecurity: No Food Insecurity (08/26/2023)  Housing: Low Risk  (08/26/2023)  Transportation Needs: No Transportation Needs (08/26/2023)  Utilities: Not At Risk (08/26/2023)  Depression (PHQ2-9): Low Risk  (12/20/2021)  Social Connections: Patient Declined (08/26/2023)  Tobacco Use: Medium Risk (08/26/2023)   SDOH Interventions:    Readmission Risk Interventions    07/20/2023    9:38 AM 01/13/2022    8:19 AM 01/12/2022    3:06 PM  Readmission Risk Prevention Plan  Transportation Screening Complete Complete Complete  PCP or Specialist Appt within 5-7 Days   Complete  PCP or Specialist Appt within 3-5 Days  Complete   Home Care Screening Complete  Complete  Medication Review (RN CM) Complete  Complete  HRI or Home Care Consult  Complete   Social Work Consult for Recovery Care Planning/Counseling  Complete   Palliative Care Screening  Not Applicable   Medication Review Oceanographer)  Complete

## 2023-08-28 NOTE — Progress Notes (Signed)
 Mobility Specialist - Progress Note   08/28/23 0912  Oxygen  Therapy  SpO2 (!) 79 %  O2 Device Nasal Cannula  O2 Flow Rate (L/min) 2 L/min  Patient Activity (if Appropriate) Ambulating  Mobility  Activity Ambulated independently in hallway  Level of Assistance Independent  Assistive Device None  Distance Ambulated (ft) 700 ft  Activity Response Tolerated well  Mobility Referral Yes  Mobility visit 1 Mobility  Mobility Specialist Start Time (ACUTE ONLY) [id number]  Mobility Specialist Stop Time (ACUTE ONLY) 0911  Mobility Specialist Time Calculation (min) (ACUTE ONLY) 18 min   Nurse requested Mobility Specialist to perform oxygen  saturation test with pt which includes removing pt from oxygen  both at rest and while ambulating.  Below are the results from that testing.     Patient Saturations on Room Air at Rest = spO2 97%  Patient Saturations on Room Air while Ambulating = sp02 79% .  Patient Saturations on 2 Liters of oxygen  while Ambulating = sp02 93%  At end of testing pt left in room on 2  Liters of oxygen .  Reported results to nurse.  Pt received in bed and agreeable to mobility. No complaints during session. Pt to EOB after session with all needs met.    Pre-mobility: 97% SpO2 (RA) During mobility: 111 HR, 81% SpO2 (RA-2L [place]) Post-mobility: 93% SPO2 (2L [place])  Chief Technology Officer

## 2023-08-29 ENCOUNTER — Other Ambulatory Visit (HOSPITAL_COMMUNITY): Payer: Self-pay

## 2023-08-29 ENCOUNTER — Other Ambulatory Visit (HOSPITAL_BASED_OUTPATIENT_CLINIC_OR_DEPARTMENT_OTHER): Payer: Self-pay

## 2023-08-29 DIAGNOSIS — E875 Hyperkalemia: Secondary | ICD-10-CM | POA: Diagnosis not present

## 2023-08-29 DIAGNOSIS — E1159 Type 2 diabetes mellitus with other circulatory complications: Secondary | ICD-10-CM | POA: Diagnosis not present

## 2023-08-29 DIAGNOSIS — J441 Chronic obstructive pulmonary disease with (acute) exacerbation: Secondary | ICD-10-CM | POA: Diagnosis not present

## 2023-08-29 DIAGNOSIS — N400 Enlarged prostate without lower urinary tract symptoms: Secondary | ICD-10-CM | POA: Diagnosis not present

## 2023-08-29 LAB — BASIC METABOLIC PANEL WITH GFR
Anion gap: 8 (ref 5–15)
BUN: 21 mg/dL (ref 8–23)
CO2: 33 mmol/L — ABNORMAL HIGH (ref 22–32)
Calcium: 8.8 mg/dL — ABNORMAL LOW (ref 8.9–10.3)
Chloride: 96 mmol/L — ABNORMAL LOW (ref 98–111)
Creatinine, Ser: 0.72 mg/dL (ref 0.61–1.24)
GFR, Estimated: >60 mL/min (ref >60)
Glucose, Bld: 86 mg/dL (ref 70–99)
Potassium: 4.3 mmol/L (ref 3.5–5.1)
Sodium: 137 mmol/L (ref 135–145)

## 2023-08-29 LAB — CBC WITH DIFFERENTIAL/PLATELET
Abs Immature Granulocytes: 0.06 K/uL (ref 0.00–0.07)
Basophils Absolute: 0 K/uL (ref 0.0–0.1)
Basophils Relative: 0 %
Eosinophils Absolute: 0.1 K/uL (ref 0.0–0.5)
Eosinophils Relative: 1 %
HCT: 36.5 % — ABNORMAL LOW (ref 39.0–52.0)
Hemoglobin: 10.6 g/dL — ABNORMAL LOW (ref 13.0–17.0)
Immature Granulocytes: 1 %
Lymphocytes Relative: 21 %
Lymphs Abs: 2.5 K/uL (ref 0.7–4.0)
MCH: 25.3 pg — ABNORMAL LOW (ref 26.0–34.0)
MCHC: 29 g/dL — ABNORMAL LOW (ref 30.0–36.0)
MCV: 87.1 fL (ref 80.0–100.0)
Monocytes Absolute: 1.1 K/uL — ABNORMAL HIGH (ref 0.1–1.0)
Monocytes Relative: 10 %
Neutro Abs: 8 K/uL — ABNORMAL HIGH (ref 1.7–7.7)
Neutrophils Relative %: 67 %
Platelets: 385 K/uL (ref 150–400)
RBC: 4.19 MIL/uL — ABNORMAL LOW (ref 4.22–5.81)
RDW: 15 % (ref 11.5–15.5)
WBC: 11.9 K/uL — ABNORMAL HIGH (ref 4.0–10.5)
nRBC: 0 % (ref 0.0–0.2)

## 2023-08-29 LAB — GLUCOSE, CAPILLARY
Glucose-Capillary: 202 mg/dL — ABNORMAL HIGH (ref 70–99)
Glucose-Capillary: 115 mg/dL — ABNORMAL HIGH (ref 70–99)
Glucose-Capillary: 197 mg/dL — ABNORMAL HIGH (ref 70–99)

## 2023-08-29 MED ORDER — PREDNISONE 20 MG PO TABS: 40.0000 mg | ORAL_TABLET | Freq: Every day | ORAL | Status: DC

## 2023-08-29 MED ORDER — IPRATROPIUM-ALBUTEROL 0.5-2.5 (3) MG/3ML IN SOLN
RESPIRATORY_TRACT | 1 refills | Status: AC
  Filled 2023-08-29: qty 360, 31d supply, fill #0

## 2023-08-29 MED ORDER — ALBUTEROL SULFATE HFA 108 (90 BASE) MCG/ACT IN AERS
1.0000 | INHALATION_SPRAY | Freq: Four times a day (QID) | RESPIRATORY_TRACT | 0 refills | Status: AC | PRN
  Filled 2023-08-29: qty 18, 25d supply, fill #0

## 2023-08-29 MED ORDER — SENNOSIDES-DOCUSATE SODIUM 8.6-50 MG PO TABS: 1.0000 | ORAL_TABLET | Freq: Two times a day (BID) | ORAL | Status: AC

## 2023-08-29 MED ORDER — FLUTICASONE PROPIONATE 50 MCG/ACT NA SUSP
2.0000 | Freq: Every day | NASAL | 2 refills | Status: AC
  Filled 2023-08-29: qty 16, 30d supply, fill #0

## 2023-08-29 MED ORDER — CLOPIDOGREL BISULFATE 75 MG PO TABS
75.0000 mg | ORAL_TABLET | Freq: Every day | ORAL | 1 refills | Status: AC
  Filled 2023-08-29: qty 30, 30d supply, fill #0

## 2023-08-29 MED ORDER — GUAIFENESIN ER 600 MG PO TB12
1200.0000 mg | ORAL_TABLET | Freq: Two times a day (BID) | ORAL | 0 refills | Status: AC
  Filled 2023-08-29: qty 20, 5d supply, fill #0

## 2023-08-29 MED ORDER — AZITHROMYCIN 250 MG PO TABS
250.0000 mg | ORAL_TABLET | Freq: Once | ORAL | 0 refills | Status: AC
  Filled 2023-08-29: qty 1, 1d supply, fill #0

## 2023-08-29 MED ORDER — POLYETHYLENE GLYCOL 3350 17 G PO PACK: 17.0000 g | PACK | Freq: Every day | ORAL | Status: AC

## 2023-08-29 MED ORDER — BUDESONIDE-FORMOTEROL FUMARATE 160-4.5 MCG/ACT IN AERO
2.0000 | INHALATION_SPRAY | Freq: Two times a day (BID) | RESPIRATORY_TRACT | 1 refills | Status: AC
  Filled 2023-08-29: qty 10.2, 30d supply, fill #0

## 2023-08-29 MED ORDER — PREDNISONE 20 MG PO TABS
ORAL_TABLET | ORAL | 0 refills | Status: AC
  Filled 2023-08-29: qty 9, 6d supply, fill #0

## 2023-08-29 MED ORDER — TAMSULOSIN HCL 0.4 MG PO CAPS
0.4000 mg | ORAL_CAPSULE | Freq: Every day | ORAL | 1 refills | Status: AC
  Filled 2023-08-29: qty 30, 30d supply, fill #0

## 2023-08-29 NOTE — Progress Notes (Signed)
PATIENT DISCHARGED

## 2023-08-29 NOTE — Plan of Care (Incomplete)
  Problem: Pain Managment: Goal: General experience of comfort will improve and/or be controlled Outcome: Progressing   Problem: Education: Goal: Ability to describe self-care measures that may prevent or decrease complications (Diabetes Survival Skills Education) will improve Outcome: Progressing   Problem: Metabolic: Goal: Ability to maintain appropriate glucose levels will improve Outcome: Progressing   Problem: Nutritional: Goal: Progress toward achieving an optimal weight will improve Outcome: Progressing   Problem: Education: Goal: Knowledge of General Education information will improve Description: Including pain rating scale, medication(s)/side effects and non-pharmacologic comfort measures Outcome: Adequate for Discharge   Problem: Health Behavior/Discharge Planning: Goal: Ability to manage health-related needs will improve Outcome: Adequate for Discharge   Problem: Clinical Measurements: Goal: Ability to maintain clinical measurements within normal limits will improve Outcome: Adequate for Discharge Goal: Will remain free from infection Outcome: Adequate for Discharge Goal: Diagnostic test results will improve Outcome: Adequate for Discharge Goal: Respiratory complications will improve Outcome: Adequate for Discharge Goal: Cardiovascular complication will be avoided Outcome: Adequate for Discharge   Problem: Activity: Goal: Risk for activity intolerance will decrease Outcome: Adequate for Discharge   Problem: Nutrition: Goal: Adequate nutrition will be maintained Outcome: Adequate for Discharge   Problem: Coping: Goal: Level of anxiety will decrease Outcome: Adequate for Discharge   Problem: Elimination: Goal: Will not experience complications related to bowel motility Outcome: Adequate for Discharge Goal: Will not experience complications related to urinary retention Outcome: Adequate for Discharge   Problem: Safety: Goal: Ability to remain free from  injury will improve Outcome: Adequate for Discharge   Problem: Skin Integrity: Goal: Risk for impaired skin integrity will decrease Outcome: Adequate for Discharge   Problem: Fluid Volume: Goal: Ability to maintain a balanced intake and output will improve Outcome: Adequate for Discharge   Problem: Nutritional: Goal: Maintenance of adequate nutrition will improve Outcome: Adequate for Discharge   Problem: Skin Integrity: Goal: Risk for impaired skin integrity will decrease Outcome: Adequate for Discharge   Problem: Tissue Perfusion: Goal: Adequacy of tissue perfusion will improve Outcome: Adequate for Discharge

## 2023-08-29 NOTE — Progress Notes (Signed)
 Discharge medications 2 secured bags delivered to patient at bedside  [facility]

## 2023-08-29 NOTE — TOC Progression Note (Addendum)
 Transition of Care [facility]) - Progression Note   Patient Details  Name: Glenn [name] MRN: [id number] Date of Birth: Jun 29, 1960  Transition of Care [facility]) CM/SW Contact  Duwaine GORMAN Aran, LCSW Phone Number: 08/29/2023, 11:30 AM  Clinical Narrative: CSW left voicemail for [facility] home oxygen  clinic requesting call back regarding patient being set up with home oxygen .  Addendum: 1:57pm-CSW received call from [place] with the [facility] regarding the home oxygen  referral. Per Rosina, the referral is still being processed. [facility] .  Addendum #2: 2:54-CSW received return call from [place] with the [place]. Per Rosina, [place] [phone]) will set up the home oxygen  with the wife today and then wife will need to bring a tank to the hospital to pick the patient up.  Patient updated. Care management signing off.  Expected Discharge Plan: Home/Self Care Barriers to Discharge: Continued Medical Work up  Expected Discharge Plan and Services In-house Referral: Clinical Social Work Post Acute Care Choice: Durable Medical Equipment Living arrangements for the past 2 months: Apartment           DME Arranged: Oxygen  DME Agency: Other - Comment [facility])  Social Determinants of Health (SDOH) Interventions SDOH Screenings   Food Insecurity: No Food Insecurity (08/26/2023)  Housing: Low Risk  (08/26/2023)  Transportation Needs: No Transportation Needs (08/26/2023)  Utilities: Not At Risk (08/26/2023)  Depression (PHQ2-9): Low Risk  (12/20/2021)  Social Connections: Patient Declined (08/26/2023)  Tobacco Use: Medium Risk (08/26/2023)   Readmission Risk Interventions    08/28/2023   11:59 AM 07/20/2023    9:38 AM 01/13/2022    8:19 AM  Readmission Risk Prevention Plan  Transportation Screening Complete Complete Complete  PCP or Specialist Appt within 3-5 Days   Complete  Home Care Screening  Complete   Medication Review (RN CM)   Complete   HRI or Home Care Consult Complete  Complete  Social Work Consult for Recovery Care Planning/Counseling Complete  Complete  Palliative Care Screening Not Applicable  Not Applicable  Medication Review Ocean[employer]) Complete  Complete

## 2023-08-29 NOTE — Discharge Summary (Signed)
 Physician Discharge Summary  Glenn [name] FMW:[id number] DOB: 1960-12-08 DOA: 08/26/2023  PCP: [facility]  Admit date: 08/26/2023 Discharge date: 08/29/2023  Time spent: 60 minutes  Recommendations for Outpatient Follow-up:  [facility] in [facility] weeks.  On follow-up patient will need a basic metabolic profile done to follow-up on electrolytes and renal function.  Patient's COPD will need to be followed up upon.  Patient's hypertension and diabetes will also need to be followed up upon. Follow-up with [facility] in 3 weeks.   Discharge Diagnoses:  Principal Problem:   COPD with acute exacerbation (HCC) Active Problems:   Acute hypoxic respiratory failure (HCC)   Type 2 diabetes mellitus (HCC)   Hypertension associated with diabetes (HCC)   History of CVA (cerebrovascular accident)   Hyperlipidemia associated with type 2 diabetes mellitus (HCC)   Chronic pain   BPH (benign prostatic hyperplasia)   Depression with anxiety   Hyperkalemia   Discharge Condition: Stable and improved.  Diet recommendation: Carb modified diet  Filed Weights   08/26/23 0909  Weight: 65.8 kg    History of present illness:  HPI per Dr. Tobie Sim Glenn [name] is a 63 y.o. male with medical history significant for COPD, sarcoidosis, history of CVA, T2DM, HTN, HLD, BPH, PTSD, depression/anxiety, chronic pain syndrome who presented to the ED for evaluation of shortness of breath.   Patient was recently admitted 6/11-6/15 for acute hypoxic respiratory failure secondary to COPD/sarcoid exacerbation as well as suspected right perihilar pneumonia.  Patient was treated with steroids, antibiotics, bronchodilators with improvement.  He was ambulatory without difficulty and saturating well on room air at time of discharge.  He was discharged home with prednisone  and Augmentin .   Patient states that since then he has had persistent cough productive of light green sputum.  He  says over the last several days he has had progressively worsening shortness of breath occurring with exertion and now at rest.  He had a coughing fit last night and had to elevate the head of his bed.  He says he has been using his home inhalers as prescribed.  He says he is not using supplemental oxygen  at home.   Patient states he is concerned about mold in in his home which is triggering his respiratory issues.  He has not had fevers, chills, diaphoresis, chest pain.   [facility] ED Course  Labs/Imaging on admission: I have personally reviewed following labs and imaging studies.   Initial vitals showed BP 159/109, pulse 108, RR 28, temp 98.4 F, SpO2 86% on room air.  Patient placed on 3 L O2 via [place] with SPO2 improved >92%.   Labs showed WBC 11.7, hemoglobin 12.3, platelets 446, sodium 136, potassium 4.3, bicarb 29, BUN 10, creatinine 0.88, serum glucose 97, lactic acid 0.7 x 2.   SARS-CoV-2, influenza, RSV PCR negative.   CTA chest negative for segmental or larger pulmonary embolism.  Extensive bronchial wall thickening with distortion and narrowing similar to previous.  Underlying emphysematous lung changes noted.  Interstitial and some groundglass changes identified along the lung bases bilaterally.   Patient was given IV Solu-Medrol  125 mg, DuoNeb and albuterol  nebulizer treatments.  The hospitalist service was consulted to admit.  Hospital Course:  #1 acute on chronic respiratory failure with hypoxia secondary to acute COPD exacerbation -Patient noted to have sats on admission of 86% on room air on arrival to the ED requiring O2. - Patient did state was supposed to be on  oxygen  but had ran out of oxygen  from his tanks for several weeks now and due to change from Medicaid to the [place] system and was still trying to figure out how to get his O2. - Patient stated increased wheezing and shortness of breath prior to admission. - CT angiogram chest done limited by breathing motion  but no evidence of segmental large PE. - Patient placed on IV Solu-Medrol , Brovana , Pulmicort , azithromycin , Mucinex , I-S, FV. -Patient also placed on Atrovent  and Xopenex  nebs, Flonase , Claritin , PPI. -Patient improved clinically, IV steroids were subsequently tapered and patient be discharged on a prednisone  taper, nebulizer treatments, 1 more day of azithromycin . -TOC was consulted and [facility] were [facility] to [facility] patient's his home O2. -Patient be discharged in stable and improved condition with outpatient follow-up with PCP and pulmonary. -Patient was discharged in stable and improved condition.   2.  Diabetes mellitus type 2 -Patient noted to have hyperglycemia in the setting of steroids. - Per admitting physician patient noted to be allergic to insulin , reportedly caused facial/tongue swelling in the past. - Hemoglobin A1c 7.6. - Patient maintained on home regimen of metformin  during the hospitalization.   - Outpatient follow-up with PCP.    3.  Hyperkalemia - Patient noted to have hyperkalemia during the hospitalization which resolved with Lokelma .   - Outpatient follow-up with PCP.    4.  Hypertension - Patient maintained on home regimen Norvasc , prazosin , Flomax .   5.  BPH - Patient placed on Flomax .     6.  History of CVA - Patient maintained on Plavix  for secondary stroke prophylaxis. - Patient also maintained on atorvastatin .   7.  Hyperlipidemia - Patient maintained on home regimen atorvastatin .   8.  PTSD/depression/anxiety - Patient was maintained on home regimen Zoloft .     9.  Chronic pain - Patient was maintained on home regimen of OxyContin  10 mg p.o. twice daily and oxycodone  20 mg 4 times daily as needed.    Procedures: CT angiogram chest 08/26/2023 Chest x-ray 08/26/2023  Consultations: None  Discharge Exam: Vitals:   08/29/23 0744 08/29/23 1337  BP:  (!) 147/90  Pulse:  (!) 107  Resp:  16  Temp:  97.6 F (36.4 C)  SpO2: 97% 97%     General: NAD Cardiovascular: RRR no murmurs rubs or gallops.  No JVD.  No pitting lower extremity edema. Respiratory: Clear to auscultation bilaterally.  No wheezes, no crackles, no rhonchi.  Fair air movement.  Discharge Instructions   Discharge Instructions     Diet Carb Modified   Complete by: As directed    Increase activity slowly   Complete by: As directed    Pulmonary Visit   Complete by: As directed    Copd/ sarcoidosis   Reason for referral: Other Pulmonary      Allergies as of 08/29/2023       Reactions   Insulins Swelling   04/2020. Do not use Novolog  insulin . Swelling of tongue   Ibuprofen Other (See Comments)   Abdominal sensitivity   Lisinopril Swelling, Rash   Tongue swelling   Shrimp [shellfish Allergy ] Itching   Tylenol  [acetaminophen ] Nausea Only   Abdominal sensitvity   Pork-derived Products Other (See Comments)   Cultural-Muslim        Medication List     STOP taking these medications    cefpodoxime 200 MG tablet Commonly known as: VANTIN   doxycycline  100 MG tablet Commonly known as: VIBRA -TABS  TAKE these medications    amLODipine  10 MG tablet Commonly known as: NORVASC  Take 1 tablet (10 mg total) by mouth daily.   aspirin  EC 81 MG tablet Take 81 mg by mouth daily. Swallow whole.   atorvastatin  40 MG tablet Commonly known as: LIPITOR Take 1 tablet (40 mg total) by mouth daily.   azithromycin  250 MG tablet Commonly known as: ZITHROMAX  Take 1 tablet (250 mg total) by mouth once for 1 dose. Start taking on: August 30, 2023   clopidogrel  75 MG tablet Commonly known as: PLAVIX  Take 1 tablet (75 mg total) by mouth daily. Start taking on: August 30, 2023 What changed:  when to take this reasons to take this   EPINEPHrine  0.3 mg/0.3 mL Soaj injection Commonly known as: EPI-PEN Inject 0.3 mg into the muscle as needed for anaphylaxis.   famotidine  20 MG tablet Commonly known as: Pepcid  One after supper What changed:   how much to take how to take this when to take this reasons to take this additional instructions   fluticasone  50 MCG/ACT nasal spray Commonly known as: FLONASE  Place 2 sprays into both nostrils daily for 10 days. Start taking on: August 30, 2023   guaiFENesin  600 MG 12 hr tablet Commonly known as: MUCINEX  Take 2 tablets (1,200 mg total) by mouth 2 (two) times daily for 5 days.   hydrOXYzine  25 MG tablet Commonly known as: ATARAX  Take 25 mg by mouth at bedtime as needed. for sleep   ipratropium-albuterol  0.5-2.5 (3) MG/3ML Soln Commonly known as: DUONEB Take 3 mLs by nebulization 3 (three) times daily for 5 days, THEN 3 mLs every 6 (six) hours as needed for up to 5 days. Start taking on: August 29, 2023 What changed: See the new instructions. Notes to patient: On 7/27, change to as needed   loratadine  10 MG tablet Commonly known as: Claritin  Take 1 tablet (10 mg total) by mouth daily.   metFORMIN  500 MG tablet Commonly known as: GLUCOPHAGE  Take 1 tablet (500 mg total) by mouth 2 (two) times daily with a meal. What changed:  when to take this reasons to take this   naloxone  4 MG/0.1ML Liqd nasal spray kit Commonly known as: NARCAN  Place 1 spray into the nose as needed (opiod overdose - respiratory distress).   OxyCONTIN  10 mg 12 hr tablet Generic drug: oxyCODONE  Take 10 mg by mouth every 12 (twelve) hours.   Oxycodone  HCl 20 MG Tabs Take 20 mg by mouth every 6 (six) hours as needed (pain).   pantoprazole  40 MG tablet Commonly known as: PROTONIX  Take 1 tablet (40 mg total) by mouth daily.   polyethylene glycol 17 g packet Commonly known as: MIRALAX  / GLYCOLAX  Take 17 g by mouth daily. Start taking on: August 30, 2023   prazosin  1 MG capsule Commonly known as: MINIPRESS  Take 1 mg by mouth at bedtime.   predniSONE  20 MG tablet Commonly known as: DELTASONE  Take 2 tablets (40 mg total) by mouth daily before breakfast for 3 days, THEN 1 tablet (20 mg total) daily  before breakfast for 3 days. Start taking on: August 30, 2023 Notes to patient: Change to one tablet daily on 7/26   promethazine-dextromethorphan  6.25-15 MG/5ML syrup Commonly known as: PROMETHAZINE-DM Take 10 mLs by mouth 2 (two) times daily as needed.   senna-docusate 8.6-50 MG tablet Commonly known as: Senokot-S Take 1 tablet by mouth 2 (two) times daily.   sertraline  50 MG tablet Commonly known as: ZOLOFT  Take 50 mg by mouth  daily.   sildenafil  100 MG tablet Commonly known as: VIAGRA  Take 1 tablet (100 mg total) by mouth daily as needed. What changed: reasons to take this   Symbicort  160-4.5 MCG/ACT inhaler Generic drug: budesonide -formoterol  Inhale 2 puffs into the lungs 2 (two) times daily.   tamsulosin  0.4 MG Caps capsule Commonly known as: FLOMAX  Take 1 capsule (0.4 mg total) by mouth daily.   Tiotropium Bromide  Monohydrate 2.5 MCG/ACT Aers Inhale 2 each into the lungs daily.   Ventolin  HFA 108 (90 Base) MCG/ACT inhaler Generic drug: albuterol  Inhale 1-2 puffs into the lungs every 6 (six) hours as needed for wheezing or shortness of breath.               Durable Medical Equipment  (From admission, onward)           Start     Ordered   08/27/23 1151  For home use only DME oxygen   Once       Question Answer Comment  Length of Need Lifetime   Mode or (Route) Nasal cannula   Liters per Minute 2   Frequency Continuous (stationary and portable oxygen  unit needed)   Oxygen  conserving device Yes   Oxygen  delivery system Gas      08/27/23 1150           Allergies  Allergen Reactions   Insulins Swelling    04/2020. Do not use Novolog  insulin . Swelling of tongue   Ibuprofen Other (See Comments)    Abdominal sensitivity   Lisinopril Swelling and Rash    Tongue swelling   Shrimp [Shellfish Allergy ] Itching   Tylenol  [Acetaminophen ] Nausea Only    Abdominal sensitvity   Pork-Derived Products Other (See Comments)    Cultural-Muslim    Follow-up  Information     [facility] Schedule an appointment as soon as possible for a visit in 2 week(s).   Contact information: [address] [facility] [place] [address] [phone]         [facility] at [facility]. Schedule an appointment as soon as possible for a visit in 3 week(s).   Specialty: Pulmonology Contact information: [address] [place] [address] [place]  [address] [phone] Additional information: [address]  [address]  [place], [place] [address]                 The results of significant diagnostics from this hospitalization (including imaging, microbiology, ancillary and laboratory) are listed below for reference.    Significant Diagnostic Studies: CT Angio Chest PE W and/or Wo Contrast Result Date: 08/26/2023 CLINICAL DATA:  Dyspnea. COPD. Sarcoid. On treatment for pneumonia. EXAM: CT ANGIOGRAPHY CHEST WITH CONTRAST TECHNIQUE: Multidetector CT imaging of the chest was performed using the standard protocol during bolus administration of intravenous contrast. Multiplanar CT image reconstructions and MIPs were obtained to evaluate the vascular anatomy. RADIATION DOSE REDUCTION: This exam was performed according to the departmental dose-optimization program which includes automated exposure control, adjustment of the mA and/or kV according to patient size and/or use of iterative reconstruction technique. CONTRAST:  75mL OMNIPAQUE  IOHEXOL  350 MG/ML SOLN COMPARISON:  Chest x-ray 08/26/2023. Noncontrast CT 10/19/2022. CTA 10/13/2022. FINDINGS: Cardiovascular: No segmental or larger pulmonary embolism identified. Some breathing motion identified which can limit evaluation of small peripheral emboli. Borderline size heart. Trace pericardial fluid. The thoracic aorta is normal course and caliber with some mild partially calcified atherosclerotic plaque. Coronary artery calcifications are seen. Please correlate  for other coronary risk  factors. Mediastinum/Nodes: No specific abnormal lymph node enlargement identified in the axillary region hila. A few small less than 1 cm size mediastinal nodes are identified, not pathologic by size criteria. This includes stable lymph node pre cardiac on series 4, image 78 with short axis of 6 mm. Slightly patulous thoracic esophagus. Preserved thyroid gland. Lungs/Pleura: Again there are postsurgical changes seen in both lungs with suture. There is diffuse bronchial wall thickening throughout the tracheobronchial tree with some areas of bronchial narrowing such as right lower lobe and left lower lobe. Please correlate history of sarcoidosis. There is some ground-glass and interstitial opacities identified in the left lower lobe greater than right lower lobe. Please correlate for history of treatment for pneumonia. Underlying emphysematous lung changes are identified. No significant pleural effusion. No frank consolidation. Apical pleural thickening. Upper Abdomen: No acute abnormality. Musculoskeletal: Scattered degenerative changes along the spine. Review of the MIP images confirms the above findings. IMPRESSION: There is some breathing motion which can limit evaluation of small and peripheral emboli. No segmental or larger pulmonary embolism identified at this time. Extensive bronchial wall thickening with distortion and narrowing, similar to previous. Please correlate with known history. There is also underlying emphysematous lung changes. Interstitial and some ground-glass changes identified along the lung bases bilaterally. Please correlate with history of treatment for pneumonia and recommend follow-up. Aortic Atherosclerosis (ICD10-I70.0) and Emphysema (ICD10-J43.9). Electronically Signed   By: Ranell Bring M.D.   On: 08/26/2023 12:06   DG Chest Portable 1 View Result Date: 08/26/2023 CLINICAL DATA:  COPD and shortness of breath. EXAM: PORTABLE CHEST 1 VIEW COMPARISON:   07/19/2023 FINDINGS: Heart size and mediastinal contours are unremarkable. There are low lung volumes with asymmetric elevation of the left hemidiaphragm, unchanged from the prior exam. No significant pleural effusion or signs of pneumothorax. Increase interstitial markings are noted bilaterally consistent with chronic scarring and architectural distortion. Persistent ill-defined hazy opacity within the perihilar right lung. Visualized osseous structures are unremarkable. IMPRESSION: 1. Low lung volumes with asymmetric elevation of the left hemidiaphragm. 2. Chronic scarring and architectural distortion. 3. Persistent ill-defined hazy opacity within the perihilar right lung. Electronically Signed   By: Waddell Calk M.D.   On: 08/26/2023 10:43    Microbiology: Recent Results (from the past 240 hours)  Resp panel by RT-PCR (RSV, Flu A&B, Covid) Anterior Nasal Swab     Status: None   Collection Time: 08/26/23  9:49 AM   Specimen: Anterior Nasal Swab  Result Value Ref Range Status   SARS Coronavirus 2 by RT PCR NEGATIVE NEGATIVE Final    Comment: (NOTE) SARS-CoV-2 target nucleic acids are NOT DETECTED.  The SARS-CoV-2 RNA is generally detectable in upper respiratory specimens during the acute phase of infection. The lowest concentration of SARS-CoV-2 viral copies this assay can detect is 138 copies/mL. A negative result does not preclude SARS-Cov-2 infection and should not be used as the sole basis for treatment or other patient management decisions. A negative result may occur with  improper specimen collection/handling, submission of specimen other than nasopharyngeal swab, presence of viral mutation(s) within the areas targeted by this assay, and inadequate number of viral copies(<138 copies/mL). A negative result must be combined with clinical observations, patient history, and epidemiological information. The expected result is Negative.  Fact Sheet for Patients:   BloggerCourse.com  Fact Sheet for Healthcare Providers:  SeriousBroker.it  This test is no t yet approved or cleared by the [place]  FDA and  has been authorized for detection and/or  diagnosis of SARS-CoV-2 by FDA under an Emergency Use Authorization (EUA). This EUA will remain  in effect (meaning this test can be used) for the duration of the COVID-19 declaration under Section 564(b)(1) of the Act, 21 U.S.C.section 360bbb-3(b)(1), unless the authorization is terminated  or revoked sooner.       Influenza A by PCR NEGATIVE NEGATIVE Final   Influenza B by PCR NEGATIVE NEGATIVE Final    Comment: (NOTE) The Xpert Xpress SARS-CoV-2/FLU/RSV plus assay is intended as an aid in the diagnosis of influenza from Nasopharyngeal swab specimens and should not be used as a sole basis for treatment. Nasal washings and aspirates are unacceptable for Xpert Xpress SARS-CoV-2/FLU/RSV testing.  Fact Sheet for Patients: BloggerCourse.com  Fact Sheet for Healthcare Providers: SeriousBroker.it  This test is not yet approved or cleared by the [place]  FDA and has been authorized for detection and/or diagnosis of SARS-CoV-2 by FDA under an Emergency Use Authorization (EUA). This EUA will remain in effect (meaning this test can be used) for the duration of the COVID-19 declaration under Section 564(b)(1) of the Act, 21 U.S.C. section 360bbb-3(b)(1), unless the authorization is terminated or revoked.     Resp Syncytial Virus by PCR NEGATIVE NEGATIVE Final    Comment: (NOTE) Fact Sheet for Patients: BloggerCourse.com  Fact Sheet for Healthcare Providers: SeriousBroker.it  This test is not yet approved or cleared by the [place]  FDA and has been authorized for detection and/or diagnosis of SARS-CoV-2 by FDA under an Emergency Use  Authorization (EUA). This EUA will remain in effect (meaning this test can be used) for the duration of the COVID-19 declaration under Section 564(b)(1) of the Act, 21 U.S.C. section 360bbb-3(b)(1), unless the authorization is terminated or revoked.  Performed at [facility], [address] [place], [place] [address]   Respiratory (~20 pathogens) panel by PCR     Status: None   Collection Time: 08/27/23  9:30 AM   Specimen: Nasopharyngeal Swab; Respiratory  Result Value Ref Range Status   Adenovirus NOT DETECTED NOT DETECTED Final   Coronavirus 229E NOT DETECTED NOT DETECTED Final    Comment: (NOTE) The Coronavirus on the Respiratory Panel, DOES NOT test for the novel  Coronavirus (2019 nCoV)    Coronavirus HKU1 NOT DETECTED NOT DETECTED Final   Coronavirus NL63 NOT DETECTED NOT DETECTED Final   Coronavirus OC43 NOT DETECTED NOT DETECTED Final   Metapneumovirus NOT DETECTED NOT DETECTED Final   Rhinovirus / Enterovirus NOT DETECTED NOT DETECTED Final   Influenza A NOT DETECTED NOT DETECTED Final   Influenza B NOT DETECTED NOT DETECTED Final   Parainfluenza Virus 1 NOT DETECTED NOT DETECTED Final   Parainfluenza Virus 2 NOT DETECTED NOT DETECTED Final   Parainfluenza Virus 3 NOT DETECTED NOT DETECTED Final   Parainfluenza Virus 4 NOT DETECTED NOT DETECTED Final   Respiratory Syncytial Virus NOT DETECTED NOT DETECTED Final   Bordetella pertussis NOT DETECTED NOT DETECTED Final   Bordetella Parapertussis NOT DETECTED NOT DETECTED Final   Chlamydophila pneumoniae NOT DETECTED NOT DETECTED Final   Mycoplasma pneumoniae NOT DETECTED NOT DETECTED Final    Comment: Performed at [facility], [address] [place], [place] [address]     Labs: Basic Metabolic Panel: Recent Labs  Lab 08/26/23 0949 08/27/23 0408 08/28/23 0348 08/29/23 0409  NA 136 131* 135 137  K 4.3 5.8*  5.7* 5.2* 4.3  CL 97* 93* 94* 96*  CO2 29 30 33* 33*  GLUCOSE 97 229* 218*  86  BUN 10 17 17 21   CREATININE 0.88 0.86 0.72 0.72  CALCIUM  10.0 8.9 9.1 8.8*  MG  --  2.3  --   --    Liver Function Tests: No results for input(s): AST, ALT, ALKPHOS, BILITOT, PROT, ALBUMIN in the last 168 hours. No results for input(s): LIPASE, AMYLASE in the last 168 hours. No results for input(s): AMMONIA in the last 168 hours. CBC: Recent Labs  Lab 08/26/23 0949 08/27/23 0408 08/28/23 0348 08/29/23 0409  WBC 11.7* 9.0 10.0 11.9*  NEUTROABS  --   --  8.8* 8.0*  HGB 12.3* 11.5* 11.1* 10.6*  HCT 38.9* 37.7* 36.9* 36.5*  MCV 83.3 84.9 86.6 87.1  PLT 446* 418* 396 385   Cardiac Enzymes: No results for input(s): CKTOTAL, CKMB, CKMBINDEX, TROPONINI in the last 168 hours. BNP: BNP (last 3 results) Recent Labs    03/25/23 0102 07/20/23 0959  BNP 38.0 76.1    ProBNP (last 3 results) No results for input(s): PROBNP in the last 8760 hours.  CBG: Recent Labs  Lab 08/28/23 1627 08/28/23 2124 08/29/23 0736 08/29/23 1118 08/29/23 1639  GLUCAP 206* 202* 202* 115* 197*       Signed:  Toribio Hummer MD.  [facility] 08/29/2023, 5:16 PM

## 2023-08-29 NOTE — Progress Notes (Signed)
 AVS reviewed w./ patient who verbalized an understanding. No other questions at this time. Pt aware we are waiting on [facility] to [facility] O[facility] for [facility] use. RN will reprint AVS once O2 company is established. Pt dressed for discharge to home. TOC meds delivered to pt in a secure bag.

## 2023-08-29 NOTE — Progress Notes (Signed)
 Contact info provided to patient regarding home O2: [employer], who will be supplying the O2. [phone]

## 2023-09-06 ENCOUNTER — Other Ambulatory Visit (HOSPITAL_COMMUNITY): Payer: Self-pay

## 2023-09-28 ENCOUNTER — Other Ambulatory Visit (HOSPITAL_COMMUNITY): Payer: Self-pay

## 2023-09-28 MED ORDER — SILDENAFIL CITRATE 100 MG PO TABS
100.0000 mg | ORAL_TABLET | Freq: Every day | ORAL | 5 refills | Status: AC | PRN
  Filled 2023-09-28: qty 30, 30d supply, fill #0
  Filled 2023-12-06: qty 30, 30d supply, fill #1
  Filled 2024-02-06: qty 30, 30d supply, fill #2
  Filled 2024-03-14: qty 30, 30d supply, fill #3

## 2023-10-20 ENCOUNTER — Ambulatory Visit (HOSPITAL_BASED_OUTPATIENT_CLINIC_OR_DEPARTMENT_OTHER)

## 2023-10-24 ENCOUNTER — Encounter (HOSPITAL_BASED_OUTPATIENT_CLINIC_OR_DEPARTMENT_OTHER): Payer: Self-pay

## 2023-10-24 ENCOUNTER — Other Ambulatory Visit: Payer: Self-pay

## 2023-10-24 ENCOUNTER — Emergency Department (HOSPITAL_BASED_OUTPATIENT_CLINIC_OR_DEPARTMENT_OTHER): Admitting: Radiology

## 2023-10-24 ENCOUNTER — Inpatient Hospital Stay (HOSPITAL_BASED_OUTPATIENT_CLINIC_OR_DEPARTMENT_OTHER)
Admission: EM | Admit: 2023-10-24 | Discharge: 2023-10-26 | DRG: 191 | Disposition: A | Discharge status: Home / Self Care | Attending: Internal Medicine | Admitting: Internal Medicine

## 2023-10-24 DIAGNOSIS — Z880 Allergy status to penicillin: Secondary | ICD-10-CM

## 2023-10-24 DIAGNOSIS — Z87891 Personal history of nicotine dependence: Secondary | ICD-10-CM

## 2023-10-24 DIAGNOSIS — E785 Hyperlipidemia, unspecified: Secondary | ICD-10-CM | POA: Diagnosis present

## 2023-10-24 DIAGNOSIS — Z9981 Dependence on supplemental oxygen: Secondary | ICD-10-CM

## 2023-10-24 DIAGNOSIS — Z91014 Allergy to mammalian meats: Secondary | ICD-10-CM

## 2023-10-24 DIAGNOSIS — I10 Essential (primary) hypertension: Secondary | ICD-10-CM | POA: Diagnosis present

## 2023-10-24 DIAGNOSIS — Z7951 Long term (current) use of inhaled steroids: Secondary | ICD-10-CM

## 2023-10-24 DIAGNOSIS — Z1152 Encounter for screening for COVID-19: Secondary | ICD-10-CM

## 2023-10-24 DIAGNOSIS — N400 Enlarged prostate without lower urinary tract symptoms: Secondary | ICD-10-CM | POA: Diagnosis present

## 2023-10-24 DIAGNOSIS — Z833 Family history of diabetes mellitus: Secondary | ICD-10-CM

## 2023-10-24 DIAGNOSIS — Z7982 Long term (current) use of aspirin: Secondary | ICD-10-CM

## 2023-10-24 DIAGNOSIS — Z8249 Family history of ischemic heart disease and other diseases of the circulatory system: Secondary | ICD-10-CM

## 2023-10-24 DIAGNOSIS — Z7902 Long term (current) use of antithrombotics/antiplatelets: Secondary | ICD-10-CM

## 2023-10-24 DIAGNOSIS — Z7984 Long term (current) use of oral hypoglycemic drugs: Secondary | ICD-10-CM

## 2023-10-24 DIAGNOSIS — F32A Depression, unspecified: Secondary | ICD-10-CM | POA: Diagnosis present

## 2023-10-24 DIAGNOSIS — J441 Chronic obstructive pulmonary disease with (acute) exacerbation: Secondary | ICD-10-CM | POA: Diagnosis not present

## 2023-10-24 DIAGNOSIS — D869 Sarcoidosis, unspecified: Secondary | ICD-10-CM | POA: Diagnosis present

## 2023-10-24 DIAGNOSIS — E119 Type 2 diabetes mellitus without complications: Secondary | ICD-10-CM | POA: Diagnosis present

## 2023-10-24 DIAGNOSIS — F419 Anxiety disorder, unspecified: Secondary | ICD-10-CM | POA: Diagnosis present

## 2023-10-24 DIAGNOSIS — Z8673 Personal history of transient ischemic attack (TIA), and cerebral infarction without residual deficits: Secondary | ICD-10-CM

## 2023-10-24 DIAGNOSIS — Z79899 Other long term (current) drug therapy: Secondary | ICD-10-CM

## 2023-10-24 DIAGNOSIS — J9611 Chronic respiratory failure with hypoxia: Secondary | ICD-10-CM | POA: Diagnosis present

## 2023-10-24 DIAGNOSIS — Z888 Allergy status to other drugs, medicaments and biological substances status: Secondary | ICD-10-CM

## 2023-10-24 DIAGNOSIS — G894 Chronic pain syndrome: Secondary | ICD-10-CM | POA: Diagnosis present

## 2023-10-24 DIAGNOSIS — Z91013 Allergy to seafood: Secondary | ICD-10-CM

## 2023-10-24 LAB — BASIC METABOLIC PANEL WITH GFR
Anion gap: 12 (ref 5–15)
BUN: 11 mg/dL (ref 8–23)
CO2: 30 mmol/L (ref 22–32)
Calcium: 9.7 mg/dL (ref 8.9–10.3)
Chloride: 97 mmol/L — ABNORMAL LOW (ref 98–111)
Creatinine, Ser: 0.71 mg/dL (ref 0.61–1.24)
GFR, Estimated: >60 mL/min (ref >60)
Glucose, Bld: 240 mg/dL — ABNORMAL HIGH (ref 70–99)
Potassium: 3.6 mmol/L (ref 3.5–5.1)
Sodium: 138 mmol/L (ref 135–145)

## 2023-10-24 LAB — CBC
HCT: 38.1 % — ABNORMAL LOW (ref 39.0–52.0)
Hemoglobin: 11.7 g/dL — ABNORMAL LOW (ref 13.0–17.0)
MCH: 25.4 pg — ABNORMAL LOW (ref 26.0–34.0)
MCHC: 30.7 g/dL (ref 30.0–36.0)
MCV: 82.6 fL (ref 80.0–100.0)
Platelets: 340 K/uL (ref 150–400)
RBC: 4.61 MIL/uL (ref 4.22–5.81)
RDW: 16.1 % — ABNORMAL HIGH (ref 11.5–15.5)
WBC: 10.2 K/uL (ref 4.0–10.5)
nRBC: 0 % (ref 0.0–0.2)

## 2023-10-24 LAB — RESP PANEL BY RT-PCR (RSV, FLU A&B, COVID)  RVPGX2
Influenza A by PCR: NEGATIVE
Influenza B by PCR: NEGATIVE
Resp Syncytial Virus by PCR: NEGATIVE
SARS Coronavirus 2 by RT PCR: NEGATIVE

## 2023-10-24 LAB — TROPONIN T, HIGH SENSITIVITY
Troponin T High Sensitivity: <15 ng/L (ref 0–19)
Troponin T High Sensitivity: <15 ng/L (ref 0–19)

## 2023-10-24 LAB — PRO BRAIN NATRIURETIC PEPTIDE: Pro Brain Natriuretic Peptide: 240 pg/mL (ref <300.0)

## 2023-10-24 MED ORDER — AMLODIPINE BESYLATE 10 MG PO TABS
10.0000 mg | ORAL_TABLET | Freq: Every day | ORAL | Status: DC
  Administered 2023-10-26: 10 mg via ORAL
  Filled 2023-10-24: qty 1

## 2023-10-24 MED ORDER — GLIPIZIDE 10 MG PO TABS
5.0000 mg | ORAL_TABLET | Freq: Every day | ORAL | Status: DC
  Filled 2023-10-24: qty 1

## 2023-10-24 MED ORDER — METFORMIN HCL 500 MG PO TABS
500.0000 mg | ORAL_TABLET | Freq: Two times a day (BID) | ORAL | Status: DC
  Filled 2023-10-24: qty 1

## 2023-10-24 MED ORDER — PRAZOSIN HCL 1 MG PO CAPS
1.0000 mg | ORAL_CAPSULE | Freq: Every day | ORAL | Status: DC
  Administered 2023-10-25: 1 mg via ORAL
  Filled 2023-10-24: qty 1

## 2023-10-24 MED ORDER — OXYCODONE HCL ER 10 MG PO T12A
10.0000 mg | EXTENDED_RELEASE_TABLET | Freq: Two times a day (BID) | ORAL | Status: DC
  Administered 2023-10-24 – 2023-10-26 (×4): 10 mg via ORAL
  Filled 2023-10-24 (×4): qty 1

## 2023-10-24 MED ORDER — LORATADINE 10 MG PO TABS
10.0000 mg | ORAL_TABLET | Freq: Every day | ORAL | Status: DC
  Administered 2023-10-26: 10 mg via ORAL
  Filled 2023-10-24: qty 1

## 2023-10-24 MED ORDER — CLOPIDOGREL BISULFATE 75 MG PO TABS
75.0000 mg | ORAL_TABLET | Freq: Every day | ORAL | Status: DC
  Administered 2023-10-26: 75 mg via ORAL
  Filled 2023-10-24: qty 1

## 2023-10-24 MED ORDER — ALBUTEROL SULFATE (2.5 MG/3ML) 0.083% IN NEBU: 2.5000 mg | INHALATION_SOLUTION | RESPIRATORY_TRACT | Status: DC | PRN

## 2023-10-24 MED ORDER — POLYETHYLENE GLYCOL 3350 17 G PO PACK
17.0000 g | PACK | Freq: Every day | ORAL | Status: DC
  Filled 2023-10-24: qty 1

## 2023-10-24 MED ORDER — SENNOSIDES-DOCUSATE SODIUM 8.6-50 MG PO TABS
1.0000 | ORAL_TABLET | Freq: Two times a day (BID) | ORAL | Status: DC
  Administered 2023-10-24 – 2023-10-26 (×4): 1 via ORAL
  Filled 2023-10-24 (×4): qty 1

## 2023-10-24 MED ORDER — SERTRALINE HCL 50 MG PO TABS
50.0000 mg | ORAL_TABLET | Freq: Every day | ORAL | Status: DC
  Filled 2023-10-24: qty 1

## 2023-10-24 MED ORDER — FLUTICASONE PROPIONATE 50 MCG/ACT NA SUSP
2.0000 | Freq: Every day | NASAL | Status: DC
  Filled 2023-10-24: qty 16

## 2023-10-24 MED ORDER — PANTOPRAZOLE SODIUM 40 MG PO TBEC
40.0000 mg | DELAYED_RELEASE_TABLET | Freq: Every day | ORAL | Status: DC
  Administered 2023-10-26: 40 mg via ORAL
  Filled 2023-10-24: qty 1

## 2023-10-24 MED ORDER — ACETAMINOPHEN 650 MG RE SUPP: 650.0000 mg | Freq: Four times a day (QID) | RECTAL | Status: DC | PRN

## 2023-10-24 MED ORDER — PROMETHAZINE-DM 6.25-15 MG/5ML PO SYRP: 10.0000 mL | ORAL_SOLUTION | Freq: Two times a day (BID) | ORAL | Status: DC | PRN

## 2023-10-24 MED ORDER — ALBUTEROL SULFATE (2.5 MG/3ML) 0.083% IN NEBU
INHALATION_SOLUTION | RESPIRATORY_TRACT | Status: AC
  Administered 2023-10-24: 5 mg
  Filled 2023-10-24: qty 6

## 2023-10-24 MED ORDER — IPRATROPIUM-ALBUTEROL 0.5-2.5 (3) MG/3ML IN SOLN
6.0000 mL | Freq: Once | RESPIRATORY_TRACT | Status: AC
  Administered 2023-10-24: 6 mL via RESPIRATORY_TRACT

## 2023-10-24 MED ORDER — OXYCODONE HCL 5 MG PO TABS
20.0000 mg | ORAL_TABLET | Freq: Four times a day (QID) | ORAL | Status: DC | PRN
  Administered 2023-10-24 – 2023-10-26 (×6): 20 mg via ORAL
  Filled 2023-10-24 (×6): qty 4

## 2023-10-24 MED ORDER — ALBUTEROL (5 MG/ML) CONTINUOUS INHALATION SOLN: 5.0000 mg/h | INHALATION_SOLUTION | RESPIRATORY_TRACT | Status: DC

## 2023-10-24 MED ORDER — METHYLPREDNISOLONE SODIUM SUCC 40 MG IJ SOLR
40.0000 mg | Freq: Two times a day (BID) | INTRAMUSCULAR | Status: DC
  Administered 2023-10-25 – 2023-10-26 (×3): 40 mg via INTRAVENOUS
  Filled 2023-10-24 (×3): qty 1

## 2023-10-24 MED ORDER — TIOTROPIUM BROMIDE MONOHYDRATE 2.5 MCG/ACT IN AERS: 2.0000 | INHALATION_SPRAY | Freq: Every day | RESPIRATORY_TRACT | Status: DC

## 2023-10-24 MED ORDER — ONDANSETRON HCL 4 MG/2ML IJ SOLN: 4.0000 mg | Freq: Four times a day (QID) | INTRAMUSCULAR | Status: DC | PRN

## 2023-10-24 MED ORDER — IPRATROPIUM-ALBUTEROL 0.5-2.5 (3) MG/3ML IN SOLN
3.0000 mL | Freq: Four times a day (QID) | RESPIRATORY_TRACT | Status: DC
  Administered 2023-10-24 – 2023-10-26 (×7): 3 mL via RESPIRATORY_TRACT
  Filled 2023-10-24 (×8): qty 3

## 2023-10-24 MED ORDER — IPRATROPIUM-ALBUTEROL 0.5-2.5 (3) MG/3ML IN SOLN
RESPIRATORY_TRACT | Status: AC
  Filled 2023-10-24: qty 6

## 2023-10-24 MED ORDER — AMLODIPINE BESYLATE 5 MG PO TABS
5.0000 mg | ORAL_TABLET | Freq: Once | ORAL | Status: AC
Start: 2023-10-24 — End: 2023-10-24
  Administered 2023-10-24: 5 mg via ORAL
  Filled 2023-10-24: qty 1

## 2023-10-24 MED ORDER — HYDROXYZINE HCL 25 MG PO TABS: 25.0000 mg | ORAL_TABLET | Freq: Every evening | ORAL | Status: DC | PRN

## 2023-10-24 MED ORDER — ATORVASTATIN CALCIUM 20 MG PO TABS
40.0000 mg | ORAL_TABLET | Freq: Every day | ORAL | Status: DC
  Administered 2023-10-26: 40 mg via ORAL
  Filled 2023-10-24: qty 2

## 2023-10-24 MED ORDER — ONDANSETRON HCL 4 MG PO TABS: 4.0000 mg | ORAL_TABLET | Freq: Four times a day (QID) | ORAL | Status: DC | PRN

## 2023-10-24 MED ORDER — METHYLPREDNISOLONE SODIUM SUCC 125 MG IJ SOLR
125.0000 mg | Freq: Once | INTRAMUSCULAR | Status: AC
  Administered 2023-10-24: 125 mg via INTRAVENOUS
  Filled 2023-10-24: qty 2

## 2023-10-24 MED ORDER — TAMSULOSIN HCL 0.4 MG PO CAPS
0.4000 mg | ORAL_CAPSULE | Freq: Every day | ORAL | Status: DC
  Administered 2023-10-26: 0.4 mg via ORAL
  Filled 2023-10-24: qty 1

## 2023-10-24 MED ORDER — ACETAMINOPHEN 325 MG PO TABS: 650.0000 mg | ORAL_TABLET | Freq: Four times a day (QID) | ORAL | Status: DC | PRN

## 2023-10-24 NOTE — ED Notes (Signed)
 Called [facility] for transport

## 2023-10-24 NOTE — H&P (Signed)
 History and Physical  JEROMIE GAINOR FMW:[id number] DOB: October 12, 1960 DOA: 10/24/2023  PCP: [facility]   Chief Complaint: cough, SOB   HPI: Glenn [name] is a 63 y.o. male with medical history significant for COPD on home oxygen , sarcoidosis, type 2 diabetes on metformin , hypertension, hyperlipidemia being admitted to the hospital with worsening dyspnea on exertion and productive cough likely due to acute exacerbation of COPD.  He has had multiple recent admissions this summer for shortness of breath, COPD exacerbation and suspected pneumonia.  Most recently, he was hospitalized here at [facility] for 3 days at the end of July with COPD exacerbation and suspected perihilar pneumonia.  Patient was treated in the usual fashion including breathing treatments, steroids and antibiotics and discharged home in stable condition.  He states that over the last week or so, he has had progressive dyspnea with exertion, some subjective chills at night, and increased cough productive of greenish sputum.  Denies any fevers, or chest pain.  Evaluation in the ER this morning as detailed below shows evidence of suspected COPD exacerbation, no evidence of acute bacterial infection and negative viral swab.  Review of Systems: Please see HPI for pertinent positives and negatives. A complete 10 system review of systems are otherwise negative.  Past Medical History:  Diagnosis Date   Anxiety    Arthritis    Asthma    Chronic pain 05/06/2020   COPD (chronic obstructive pulmonary disease) (HCC)    CVA (cerebral vascular accident) (HCC)    2010; 06/2020   Depression    Diabetes mellitus type 2 in nonobese [facility])    Dyspnea    ED (erectile dysfunction)    Hyperlipidemia    Hypertension    Sarcoid    Tobacco dependence 05/06/2020   Past Surgical History:  Procedure Laterality Date   LUNG SURGERY     removed tissue   Social History:  reports that he quit smoking about 3 years ago. His smoking use included  cigarettes. He started smoking about 37 years ago. He has a 34 pack-year smoking history. He has never been exposed to tobacco smoke. He has never used smokeless tobacco. He reports that he does not drink alcohol and does not use drugs.  Allergies  Allergen Reactions   Insulins Swelling    04/2020. Do not use Novolog  insulin . Swelling of tongue   Ibuprofen Other (See Comments)    Abdominal sensitivity   Lisinopril Swelling and Rash    Tongue swelling   Shrimp [Shellfish Allergy ] Itching   Tylenol  [Acetaminophen ] Nausea Only    Abdominal sensitvity   Pork-Derived Products Other (See Comments)    Cultural-Muslim    Family History  Problem Relation Age of Onset   Hypertension Mother    Diabetes Maternal Grandmother    Colon cancer Neg Hx    Esophageal cancer Neg Hx    Rectal cancer Neg Hx    Inflammatory bowel disease Neg Hx    Liver disease Neg Hx    Pancreatic cancer Neg Hx      Prior to Admission medications   Medication Sig Start Date End Date Taking? Authorizing Provider  albuterol  (VENTOLIN  HFA) 108 (90 Base) MCG/ACT inhaler Inhale 1-2 puffs into the lungs every 6 (six) hours as needed for wheezing or shortness of breath. 08/29/23   Sebastian Toribio GAILS, MD  amLODipine  (NORVASC ) 10 MG tablet Take 1 tablet (10 mg total) by mouth daily. 10/03/21   Rai, Nydia POUR, MD  aspirin  EC 81 MG tablet  Take 81 mg by mouth daily. Swallow whole.    [provider]  atorvastatin  (LIPITOR) 40 MG tablet Take 1 tablet (40 mg total) by mouth daily. 06/26/20   Pokhrel, Laxman, MD  budesonide -formoterol  (SYMBICORT ) 160-4.5 MCG/ACT inhaler Inhale 2 puffs into the lungs 2 (two) times daily. 08/29/23   Sebastian Toribio GAILS, MD  clopidogrel  (PLAVIX ) 75 MG tablet Take 1 tablet (75 mg total) by mouth daily. 08/30/23   Sebastian Toribio GAILS, MD  EPINEPHrine  0.3 mg/0.3 mL IJ SOAJ injection Inject 0.3 mg into the muscle as needed for anaphylaxis. 08/08/23   [provider]  famotidine  (PEPCID ) 20 MG  tablet One after supper Patient taking differently: Take 20 mg by mouth daily as needed for heartburn or indigestion. 11/22/21   Darlean Ozell NOVAK, MD  fluticasone  (FLONASE ) 50 MCG/ACT nasal spray Place 2 sprays into both nostrils daily for 10 days. 08/30/23   Sebastian Toribio GAILS, MD  hydrOXYzine  (ATARAX ) 25 MG tablet Take 25 mg by mouth at bedtime as needed. for sleep 08/08/23   [provider]  ipratropium-albuterol  (DUONEB) 0.5-2.5 (3) MG/3ML SOLN Take 3 mLs by nebulization 3 (three) times daily for 5 days, THEN 3 mLs every 6 (six) hours as needed for up to 5 days. 08/29/23 09/29/23  Sebastian Toribio GAILS, MD  loratadine  (CLARITIN ) 10 MG tablet Take 1 tablet (10 mg total) by mouth daily. 01/03/22     metFORMIN  (GLUCOPHAGE ) 500 MG tablet Take 1 tablet (500 mg total) by mouth 2 (two) times daily with a meal. Patient taking differently: Take 500 mg by mouth daily as needed (high blood sugar). 10/03/21   Rai, Nydia POUR, MD  naloxone  (NARCAN ) nasal spray 4 mg/0.1 mL Place 1 spray into the nose as needed (opiod overdose - respiratory distress). 08/20/21   [provider]  oxyCODONE  (OXYCONTIN ) 10 mg 12 hr tablet Take 10 mg by mouth every 12 (twelve) hours.    [provider]  Oxycodone  HCl 20 MG TABS Take 20 mg by mouth every 6 (six) hours as needed (pain).    [provider]  pantoprazole  (PROTONIX ) 40 MG tablet Take 1 tablet (40 mg total) by mouth daily. 07/02/19   Elgergawy, Dawood S, MD  polyethylene glycol (MIRALAX  / GLYCOLAX ) 17 g packet Take 17 g by mouth daily. 08/30/23   Sebastian Toribio GAILS, MD  prazosin  (MINIPRESS ) 1 MG capsule Take 1 mg by mouth at bedtime.    [provider]  promethazine -dextromethorphan  (PROMETHAZINE -DM) 6.25-15 MG/5ML syrup Take 10 mLs by mouth 2 (two) times daily as needed. 08/12/23   [provider]  senna-docusate (SENOKOT-S) 8.6-50 MG tablet Take 1 tablet by mouth 2 (two) times daily. 08/29/23   Sebastian Toribio GAILS, MD  sertraline   (ZOLOFT ) 50 MG tablet Take 50 mg by mouth daily. 08/08/23   [provider]  sildenafil  (VIAGRA ) 100 MG tablet Take 1 tablet (100 mg total) by mouth daily as needed. 09/28/23     tamsulosin  (FLOMAX ) 0.4 MG CAPS capsule Take 1 capsule (0.4 mg total) by mouth daily. 08/29/23   Sebastian Toribio GAILS, MD  Tiotropium Bromide  Monohydrate 2.5 MCG/ACT AERS Inhale 2 each into the lungs daily.    [provider]    Physical Exam: BP (!) 160/97   Pulse (!) 104   Temp 98.5 F (36.9 C)   Resp (!) 24   Ht 5' 5 (1.651 m)   Wt 65.8 kg   SpO2 90%   BMI 24.13 kg/m  General:  Alert, oriented,  calm, in no acute distress, no cough, resting comfortably on 2 L nasal cannula oxygen  Cardiovascular: RRR, no murmurs or rubs, no peripheral edema  Respiratory: Breath sounds are diminished globally, he has some end expiratory wheezing bilaterally, no crackles  Abdomen: soft, nontender, nondistended, normal bowel tones heard  Skin: dry, no rashes  Musculoskeletal: no joint effusions, normal range of motion  Psychiatric: appropriate affect, normal speech  Neurologic: extraocular muscles intact, clear speech, moving all extremities with intact sensorium         Labs on Admission:  Basic Metabolic Panel: Recent Labs  Lab 10/24/23 0920  NA 138  K 3.6  CL 97*  CO2 30  GLUCOSE 240*  BUN 11  CREATININE 0.71  CALCIUM  9.7   Liver Function Tests: No results for input(s): AST, ALT, ALKPHOS, BILITOT, PROT, ALBUMIN in the last 168 hours. No results for input(s): LIPASE, AMYLASE in the last 168 hours. No results for input(s): AMMONIA in the last 168 hours. CBC: Recent Labs  Lab 10/24/23 0920  WBC 10.2  HGB 11.7*  HCT 38.1*  MCV 82.6  PLT 340   Cardiac Enzymes: No results for input(s): CKTOTAL, CKMB, CKMBINDEX, TROPONINI in the last 168 hours. BNP (last 3 results) Recent Labs    03/25/23 0102 07/20/23 0959  BNP 38.0 76.1    ProBNP (last 3 results) Recent Labs     10/24/23 0920  PROBNP 240.0    CBG: No results for input(s): GLUCAP in the last 168 hours.  Radiological Exams on Admission: DG Chest 1 View Result Date: 10/24/2023 EXAM: 1 VIEW XRAY OF THE CHEST 10/24/2023 09:30:00 AM COMPARISON: 08/26/2023 CLINICAL HISTORY: Arrives POV with complaints of worsening shortness of breath and cough x2 weeks. Patient has a hx of COPD and wears 2L of oxygen  at baseline. Did two nebulizer treatments prior to arrival. FINDINGS: LUNGS AND PLEURA: Chronic bilateral interstitial changes. Bilateral upper lung postsurgical changes. No focal pulmonary opacity. No pulmonary edema. No pleural effusion. No pneumothorax. HEART AND MEDIASTINUM: No acute abnormality of the cardiac and mediastinal silhouettes. BONES AND SOFT TISSUES: Surgical clips and sutures in left hemithorax. Persistent elevated left hemidiaphragm. No acute osseous abnormality. IMPRESSION: 1. No acute findings. 2. Persistent elevated left hemidiaphragm, chronic bilateral interstitial changes, and bilateral upper lung postsurgical changes. Electronically signed by: Waddell Calk MD 10/24/2023 09:45 AM EDT RP Workstation: [id number]   Assessment/Plan JC VERON is a 63 y.o. male with medical history significant for COPD on home oxygen , sarcoidosis, type 2 diabetes on metformin , hypertension, hyperlipidemia being admitted to the hospital with worsening dyspnea on exertion and productive cough likely due to acute exacerbation of COPD.   Acute exacerbation of COPD-suspected due to wheezing, increased cough, dyspnea on exertion.  No evidence of acute viral or bacterial infection.  Patient states he is feeling much better now, after receiving breathing treatments and IV steroids in the emergency department. -Observation admission -Continue supplemental oxygen  as needed, with goal O2 saturation greater than 89% -Scheduled DuoNebs, as needed albuterol  inhaler -IV Solu-Medrol  twice daily -Incentive spirometer and  flutter valve  Chronic hypoxic respiratory failure-due to known sarcoidosis and COPD  Hypertension-continue amlodipine   Chronic pain-continue home OxyContin , oxycodone , and bowel regimen  Type 2 diabetes-patient claims allergy  to insulin , continue carb modified diet and metformin  while in house  History of CVA-continue Plavix   BPH-Flomax   Depression/anxiety-continue Zoloft   Hyperlipidemia-atorvastatin   DVT prophylaxis: SCDs only due to pork allergy     Code Status: Full Code  Consults called: None  Admission status: Observation  Time spent: 49 minutes  Syvilla Martin CHRISTELLA Gail MD [facility] Pager [phone]  If 7PM-7AM, please contact night-coverage [email or web address] Password [email or web address]  10/24/2023, 2:04 PM

## 2023-10-24 NOTE — Progress Notes (Signed)
   10/24/23 1945  Chest Physiotherapy Tx  CPT Delivery Source Flutter valve  $ Expiratory Vibration Device Administration  Initial  $ Flutter Blue Yes  CPT Duration  (10breaths)  CPT Chest Site Full range  Post-Treatment Respirations 18  Cough Non-productive;Strong  Position Sitting  CPT Treatment Tolerance Tolerated well

## 2023-10-24 NOTE — ED Provider Notes (Signed)
  EMERGENCY DEPARTMENT AT [facility] Provider Note   CSN: [id number] Arrival date & time: 10/24/23  9157     Patient presents with: Shortness of Breath and Cough   Glenn [name] is a 63 y.o. male.   63 year old male presents for evaluation of shortness of breath and cough.  He has a history of sarcoidosis and asthma and COPD.  He is on oxygen  at home.  Wife states he has been unable to sleep the last couple nights due to being short of breath.  Patient states he had an increased cough as well.  He has been using breathing treatments at home without much improvement.  Denies any other symptoms or concerns.   Shortness of Breath Associated symptoms: cough   Associated symptoms: no abdominal pain, no chest pain, no ear pain, no fever, no rash, no sore throat and no vomiting   Cough Associated symptoms: shortness of breath   Associated symptoms: no chest pain, no chills, no ear pain, no fever, no rash and no sore throat        Prior to Admission medications   Medication Sig Start Date End Date Taking? Authorizing Provider  albuterol  (VENTOLIN  HFA) 108 (90 Base) MCG/ACT inhaler Inhale 1-2 puffs into the lungs every 6 (six) hours as needed for wheezing or shortness of breath. Patient taking differently: Inhale 2 puffs into the lungs every 6 (six) hours as needed for wheezing or shortness of breath. 08/29/23  Yes Sebastian Toribio GAILS, MD  aspirin  EC 81 MG tablet Take 81 mg by mouth daily. Swallow whole.   Yes [provider]  budesonide -formoterol  (SYMBICORT ) 160-4.5 MCG/ACT inhaler Inhale 2 puffs into the lungs 2 (two) times daily. 08/29/23  Yes Sebastian Toribio GAILS, MD  sildenafil  (VIAGRA ) 100 MG tablet Take 1 tablet (100 mg total) by mouth daily as needed. Patient taking differently: Take 100 mg by mouth daily as needed for erectile dysfunction. 09/28/23  Yes   amLODipine  (NORVASC ) 10 MG tablet Take 1 tablet (10 mg total) by mouth daily. 10/03/21   Rai, Nydia POUR,  MD  atorvastatin  (LIPITOR) 40 MG tablet Take 1 tablet (40 mg total) by mouth daily. 06/26/20   Pokhrel, Laxman, MD  clopidogrel  (PLAVIX ) 75 MG tablet Take 1 tablet (75 mg total) by mouth daily. Patient not taking: Reported on 10/24/2023 08/30/23   Sebastian Toribio GAILS, MD  EPINEPHrine  0.3 mg/0.3 mL IJ SOAJ injection Inject 0.3 mg into the muscle as needed for anaphylaxis. 08/08/23   [provider]  famotidine  (PEPCID ) 20 MG tablet One after supper Patient taking differently: Take 20 mg by mouth daily as needed for heartburn or indigestion. 11/22/21   Darlean Ozell NOVAK, MD  fluticasone  (FLONASE ) 50 MCG/ACT nasal spray Place 2 sprays into both nostrils daily for 10 days. 08/30/23   Sebastian Toribio GAILS, MD  hydrOXYzine  (ATARAX ) 25 MG tablet Take 25 mg by mouth at bedtime as needed. for sleep 08/08/23   [provider]  ipratropium-albuterol  (DUONEB) 0.5-2.5 (3) MG/3ML SOLN Take 3 mLs by nebulization 3 (three) times daily for 5 days, THEN 3 mLs every 6 (six) hours as needed for up to 5 days. 08/29/23 10/24/23  Sebastian Toribio GAILS, MD  loratadine  (CLARITIN ) 10 MG tablet Take 1 tablet (10 mg total) by mouth daily. 01/03/22     metFORMIN  (GLUCOPHAGE ) 500 MG tablet Take 1 tablet (500 mg total) by mouth 2 (two) times daily with a meal. Patient taking differently: Take 500 mg by mouth daily as needed (  high blood sugar). 10/03/21   Rai, Nydia POUR, MD  naloxone  (NARCAN ) nasal spray 4 mg/0.1 mL Place 1 spray into the nose as needed (opioid overdose - respiratory distress). 08/20/21   [provider]  oxyCODONE  (OXYCONTIN ) 10 mg 12 hr tablet Take 10 mg by mouth every 12 (twelve) hours.    [provider]  Oxycodone  HCl 20 MG TABS Take 20 mg by mouth every 6 (six) hours as needed (pain).    [provider]  pantoprazole  (PROTONIX ) 40 MG tablet Take 1 tablet (40 mg total) by mouth daily. 07/02/19   Elgergawy, Dawood S, MD  polyethylene glycol (MIRALAX  / GLYCOLAX ) 17 g packet Take 17 g by  mouth daily. 08/30/23   Sebastian Toribio GAILS, MD  prazosin  (MINIPRESS ) 1 MG capsule Take 1 mg by mouth at bedtime.    [provider]  promethazine -dextromethorphan  (PROMETHAZINE -DM) 6.25-15 MG/5ML syrup Take 10 mLs by mouth 2 (two) times daily as needed. 08/12/23   [provider]  senna-docusate (SENOKOT-S) 8.6-50 MG tablet Take 1 tablet by mouth 2 (two) times daily. 08/29/23   Sebastian Toribio GAILS, MD  sertraline  (ZOLOFT ) 50 MG tablet Take 50 mg by mouth daily. 08/08/23   [provider]  tamsulosin  (FLOMAX ) 0.4 MG CAPS capsule Take 1 capsule (0.4 mg total) by mouth daily. Patient not taking: Reported on 10/24/2023 08/29/23   Sebastian Toribio GAILS, MD  Tiotropium Bromide  Monohydrate 2.5 MCG/ACT AERS Inhale 2 each into the lungs daily.    [provider]    Allergies: Insulins, Acetaminophen , Ibuprofen, Lisinopril, Shrimp [shellfish allergy ], Insulin  aspart, Pork allergy , Amoxicillin -pot clavulanate, and Pork-derived products    Review of Systems  Constitutional:  Negative for chills and fever.  HENT:  Negative for ear pain and sore throat.   Eyes:  Negative for pain and visual disturbance.  Respiratory:  Positive for cough and shortness of breath.   Cardiovascular:  Negative for chest pain and palpitations.  Gastrointestinal:  Negative for abdominal pain and vomiting.  Genitourinary:  Negative for dysuria and hematuria.  Musculoskeletal:  Negative for arthralgias and back pain.  Skin:  Negative for color change and rash.  Neurological:  Negative for seizures and syncope.  All other systems reviewed and are negative.   Updated Vital Signs BP (!) 160/97   Pulse (!) 104   Temp 98.5 F (36.9 C)   Resp (!) 24   Ht 5' 5 (1.651 m)   Wt 65.7 kg   SpO2 92%   BMI 24.10 kg/m   Physical Exam Vitals and nursing note reviewed.  Constitutional:      General: He is in acute distress.     Appearance: He is well-developed. He is ill-appearing.  HENT:     Head:  Normocephalic and atraumatic.  Eyes:     Conjunctiva/sclera: Conjunctivae normal.  Cardiovascular:     Rate and Rhythm: Regular rhythm. Tachycardia present.     Heart sounds: No murmur heard. Pulmonary:     Effort: Tachypnea and respiratory distress present.     Breath sounds: Wheezing present.     Comments: Wheezing throughout Abdominal:     Palpations: Abdomen is soft.     Tenderness: There is no abdominal tenderness.  Musculoskeletal:        General: No swelling.     Cervical back: Neck supple.  Skin:    General: Skin is warm and dry.     Capillary Refill: Capillary refill takes less than 2 seconds.  Neurological:  Mental Status: He is alert.  Psychiatric:        Mood and Affect: Mood normal.     (all labs ordered are listed, but only abnormal results are displayed) Labs Reviewed  BASIC METABOLIC PANEL WITH GFR - Abnormal; Notable for the following components:      Result Value   Chloride 97 (*)    Glucose, Bld 240 (*)    All other components within normal limits  CBC - Abnormal; Notable for the following components:   Hemoglobin 11.7 (*)    HCT 38.1 (*)    MCH 25.4 (*)    RDW 16.1 (*)    All other components within normal limits  RESP PANEL BY RT-PCR (RSV, FLU A&B, COVID)  RVPGX2  PRO BRAIN NATRIURETIC PEPTIDE  TROPONIN T, HIGH SENSITIVITY  TROPONIN T, HIGH SENSITIVITY    EKG: EKG Interpretation Date/Time:  Tuesday October 24 2023 08:52:06 EDT Ventricular Rate:  110 PR Interval:  142 QRS Duration:  80 QT Interval:  358 QTC Calculation: 485 R Axis:   57  Text Interpretation: Sinus tachycardia Left atrial enlargement Borderline T wave abnormalities Borderline prolonged QT interval Confirmed by Gennaro Bouchard ([id number]) on 10/24/2023 9:00:37 AM  Radiology: [facility] Chest 1 View Result Date: 10/24/2023 EXAM: 1 VIEW XRAY OF THE CHEST 10/24/2023 09:30:00 AM COMPARISON: 08/26/2023 CLINICAL HISTORY: Arrives POV with complaints of worsening shortness of breath and  cough x2 weeks. Patient has a hx of COPD and wears 2L of oxygen  at baseline. Did two nebulizer treatments prior to arrival. FINDINGS: LUNGS AND PLEURA: Chronic bilateral interstitial changes. Bilateral upper lung postsurgical changes. No focal pulmonary opacity. No pulmonary edema. No pleural effusion. No pneumothorax. HEART AND MEDIASTINUM: No acute abnormality of the cardiac and mediastinal silhouettes. BONES AND SOFT TISSUES: Surgical clips and sutures in left hemithorax. Persistent elevated left hemidiaphragm. No acute osseous abnormality. IMPRESSION: 1. No acute findings. 2. Persistent elevated left hemidiaphragm, chronic bilateral interstitial changes, and bilateral upper lung postsurgical changes. Electronically signed by: Waddell Calk MD 10/24/2023 09:45 AM EDT RP Workstation: [id number]     Procedures   Medications Ordered in the ED  ipratropium-albuterol  (DUONEB) 0.5-2.5 (3) MG/3ML nebulizer solution (  Not Given 10/24/23 0901)  albuterol  (PROVENTIL ,VENTOLIN ) solution continuous neb (5 mg/hr Nebulization Patient Refused/Not Given 10/24/23 0908)  ipratropium-albuterol  (DUONEB) 0.5-2.5 (3) MG/3ML nebulizer solution 3 mL (3 mLs Nebulization Not Given 10/24/23 1319)  methylPREDNISolone  sodium succinate (SOLU-MEDROL ) 40 mg/mL injection 40 mg (has no administration in time range)  oxyCODONE  (OXYCONTIN ) 12 hr tablet 10 mg (has no administration in time range)  oxyCODONE  (Oxy IR/ROXICODONE ) immediate release tablet 20 mg (has no administration in time range)  amLODipine  (NORVASC ) tablet 10 mg (has no administration in time range)  atorvastatin  (LIPITOR) tablet 40 mg (has no administration in time range)  prazosin  (MINIPRESS ) capsule 1 mg (has no administration in time range)  hydrOXYzine  (ATARAX ) tablet 25 mg (has no administration in time range)  metFORMIN  (GLUCOPHAGE ) tablet 500 mg (has no administration in time range)  sertraline  (ZOLOFT ) tablet 50 mg (has no administration in time range)   pantoprazole  (PROTONIX ) EC tablet 40 mg (has no administration in time range)  polyethylene glycol (MIRALAX  / GLYCOLAX ) packet 17 g (has no administration in time range)  senna-docusate (Senokot-S) tablet 1 tablet (has no administration in time range)  tamsulosin  (FLOMAX ) capsule 0.4 mg (has no administration in time range)  clopidogrel  (PLAVIX ) tablet 75 mg (has no administration in time range)  fluticasone  (FLONASE ) 50 MCG/ACT nasal spray 2  spray (has no administration in time range)  loratadine  (CLARITIN ) tablet 10 mg (has no administration in time range)  promethazine -dextromethorphan  (PROMETHAZINE -DM) 6.25-15 MG/5ML syrup 10 mL (has no administration in time range)  acetaminophen  (TYLENOL ) tablet 650 mg (has no administration in time range)    Or  acetaminophen  (TYLENOL ) suppository 650 mg (has no administration in time range)  ondansetron  (ZOFRAN ) tablet 4 mg (has no administration in time range)    Or  ondansetron  (ZOFRAN ) injection 4 mg (has no administration in time range)  albuterol  (PROVENTIL ) (2.5 MG/3ML) 0.083% nebulizer solution 2.5 mg (has no administration in time range)  ipratropium-albuterol  (DUONEB) 0.5-2.5 (3) MG/3ML nebulizer solution 6 mL (6 mLs Nebulization Given 10/24/23 0900)  methylPREDNISolone  sodium succinate (SOLU-MEDROL ) 125 mg/2 mL injection 125 mg (125 mg Intravenous Given 10/24/23 0918)  albuterol  (PROVENTIL ) (2.5 MG/3ML) 0.083% nebulizer solution (5 mg  Given 10/24/23 0908)                                    Medical Decision Making Cardiac monitor interpretation: Sinus tachycardia, no ectopy  Patient here for COPD exacerbation in the setting of sarcoidosis.  He is on oxygen  at home.  Came in in respiratory distress, tachypneic, diffuse wheezing and uncomfortable appearing.  He has better after an hour-Serres breathing treatment.  He declined BiPAP but overall after the breathing treatment doing much better.  Was given steroids as well.  Workup largely negative  otherwise.  Discussed patient's case with hospitalist and patient will be admitted for further workup and management.  Patient and family at bedside are agreeable with the plan.  Problems Addressed: COPD exacerbation (HCC): acute illness or injury that poses a threat to life or bodily functions Sarcoidosis: chronic illness or injury with exacerbation, progression, or side effects of treatment  Amount and/or Complexity of Data Reviewed Independent Historian: spouse    Details: Spouse at bedside helps provide history due to the patient's acuity of condition.  She states he has this happen quite frequently usually get admitted.  States she has had increased coughing last few days External Data Reviewed: notes.    Details: Prior ED and hospital records reviewed and patient last admission for similar symptoms in July Labs: ordered. Decision-making details documented in ED Course.    Details: Ordered and reviewed by me and unremarkable Radiology: ordered and independent interpretation performed. Decision-making details documented in ED Course.    Details: Ordered and interpreted by me independently of radiology Chest x-ray: Shows no acute abnormality ECG/medicine tests: ordered and independent interpretation performed. Decision-making details documented in ED Course.    Details: Ordered and interpreted by me in the absence of cardiology and shows sinus tachycardia, no STEMI  Risk OTC drugs. Prescription drug management. Drug therapy requiring intensive monitoring for toxicity. Decision regarding hospitalization. Risk Details: CRITICAL CARE Performed by: Duwaine LITTIE Fusi   Total critical care time: 34 minutes  Critical care time was exclusive of separately billable procedures and treating other patients.  Critical care was necessary to treat or prevent imminent or life-threatening deterioration.  Critical care was time spent personally by me on the following activities: development of  treatment plan with patient and/or surrogate as well as nursing, discussions with consultants, evaluation of patient's response to treatment, examination of patient, obtaining history from patient or surrogate, ordering and performing treatments and interventions, ordering and review of laboratory studies, ordering and review of radiographic studies, pulse oximetry and  re-evaluation of patient's condition.   Critical Care Total time providing critical care: 34 minutes    Final diagnoses:  COPD exacerbation [facility])  Sarcoidosis    ED Discharge Orders     None          Gennaro Duwaine CROME, DO 10/24/23 1528

## 2023-10-24 NOTE — ED Notes (Signed)
 Report give to nurse Verneita

## 2023-10-24 NOTE — ED Notes (Signed)
Attempted report x1. Nurse unavailable  

## 2023-10-24 NOTE — Plan of Care (Signed)
  Problem: Clinical Measurements: Goal: Diagnostic test results will improve Outcome: Progressing Goal: Respiratory complications will improve Outcome: Progressing Goal: Cardiovascular complication will be avoided Outcome: Progressing   Problem: Activity: Goal: Risk for activity intolerance will decrease Outcome: Progressing

## 2023-10-24 NOTE — ED Triage Notes (Signed)
 Arrives POV with complaints of worsening shortness of breath and cough x2 weeks. Patient has a hx of COPD and wears 2L of oxygen  at baseline. Did two nebulizer treatments prior to arrival.

## 2023-10-25 ENCOUNTER — Ambulatory Visit (HOSPITAL_BASED_OUTPATIENT_CLINIC_OR_DEPARTMENT_OTHER): Admitting: Pulmonary Disease

## 2023-10-25 DIAGNOSIS — Z91014 Allergy to mammalian meats: Secondary | ICD-10-CM | POA: Diagnosis not present

## 2023-10-25 DIAGNOSIS — Z7984 Long term (current) use of oral hypoglycemic drugs: Secondary | ICD-10-CM | POA: Diagnosis not present

## 2023-10-25 DIAGNOSIS — Z8673 Personal history of transient ischemic attack (TIA), and cerebral infarction without residual deficits: Secondary | ICD-10-CM | POA: Diagnosis not present

## 2023-10-25 DIAGNOSIS — Z888 Allergy status to other drugs, medicaments and biological substances status: Secondary | ICD-10-CM | POA: Diagnosis not present

## 2023-10-25 DIAGNOSIS — Z8249 Family history of ischemic heart disease and other diseases of the circulatory system: Secondary | ICD-10-CM | POA: Diagnosis not present

## 2023-10-25 DIAGNOSIS — Z7902 Long term (current) use of antithrombotics/antiplatelets: Secondary | ICD-10-CM | POA: Diagnosis not present

## 2023-10-25 DIAGNOSIS — Z7982 Long term (current) use of aspirin: Secondary | ICD-10-CM | POA: Diagnosis not present

## 2023-10-25 DIAGNOSIS — F32A Depression, unspecified: Secondary | ICD-10-CM | POA: Diagnosis present

## 2023-10-25 DIAGNOSIS — I10 Essential (primary) hypertension: Secondary | ICD-10-CM | POA: Diagnosis present

## 2023-10-25 DIAGNOSIS — D869 Sarcoidosis, unspecified: Secondary | ICD-10-CM | POA: Diagnosis present

## 2023-10-25 DIAGNOSIS — Z91013 Allergy to seafood: Secondary | ICD-10-CM | POA: Diagnosis not present

## 2023-10-25 DIAGNOSIS — G894 Chronic pain syndrome: Secondary | ICD-10-CM | POA: Diagnosis present

## 2023-10-25 DIAGNOSIS — E785 Hyperlipidemia, unspecified: Secondary | ICD-10-CM | POA: Diagnosis present

## 2023-10-25 DIAGNOSIS — Z87891 Personal history of nicotine dependence: Secondary | ICD-10-CM | POA: Diagnosis not present

## 2023-10-25 DIAGNOSIS — Z79899 Other long term (current) drug therapy: Secondary | ICD-10-CM | POA: Diagnosis not present

## 2023-10-25 DIAGNOSIS — J9611 Chronic respiratory failure with hypoxia: Secondary | ICD-10-CM | POA: Diagnosis present

## 2023-10-25 DIAGNOSIS — Z880 Allergy status to penicillin: Secondary | ICD-10-CM | POA: Diagnosis not present

## 2023-10-25 DIAGNOSIS — F419 Anxiety disorder, unspecified: Secondary | ICD-10-CM | POA: Diagnosis present

## 2023-10-25 DIAGNOSIS — Z833 Family history of diabetes mellitus: Secondary | ICD-10-CM | POA: Diagnosis not present

## 2023-10-25 DIAGNOSIS — N400 Enlarged prostate without lower urinary tract symptoms: Secondary | ICD-10-CM | POA: Diagnosis present

## 2023-10-25 DIAGNOSIS — Z7951 Long term (current) use of inhaled steroids: Secondary | ICD-10-CM | POA: Diagnosis not present

## 2023-10-25 DIAGNOSIS — Z9981 Dependence on supplemental oxygen: Secondary | ICD-10-CM | POA: Diagnosis not present

## 2023-10-25 DIAGNOSIS — Z1152 Encounter for screening for COVID-19: Secondary | ICD-10-CM | POA: Diagnosis not present

## 2023-10-25 DIAGNOSIS — J441 Chronic obstructive pulmonary disease with (acute) exacerbation: Secondary | ICD-10-CM | POA: Diagnosis present

## 2023-10-25 DIAGNOSIS — E119 Type 2 diabetes mellitus without complications: Secondary | ICD-10-CM | POA: Diagnosis present

## 2023-10-25 LAB — CBC
HCT: 39.8 % (ref 39.0–52.0)
Hemoglobin: 11.7 g/dL — ABNORMAL LOW (ref 13.0–17.0)
MCH: 24.7 pg — ABNORMAL LOW (ref 26.0–34.0)
MCHC: 29.4 g/dL — ABNORMAL LOW (ref 30.0–36.0)
MCV: 84 fL (ref 80.0–100.0)
Platelets: 347 K/uL (ref 150–400)
RBC: 4.74 MIL/uL (ref 4.22–5.81)
RDW: 15.9 % — ABNORMAL HIGH (ref 11.5–15.5)
WBC: 8.1 K/uL (ref 4.0–10.5)
nRBC: 0 % (ref 0.0–0.2)

## 2023-10-25 LAB — BASIC METABOLIC PANEL WITH GFR
Anion gap: 10 (ref 5–15)
BUN: 14 mg/dL (ref 8–23)
CO2: 30 mmol/L (ref 22–32)
Calcium: 9.5 mg/dL (ref 8.9–10.3)
Chloride: 98 mmol/L (ref 98–111)
Creatinine, Ser: 0.57 mg/dL — ABNORMAL LOW (ref 0.61–1.24)
GFR, Estimated: >60 mL/min (ref >60)
Glucose, Bld: 160 mg/dL — ABNORMAL HIGH (ref 70–99)
Potassium: 4.6 mmol/L (ref 3.5–5.1)
Sodium: 137 mmol/L (ref 135–145)

## 2023-10-25 MED ORDER — DM-GUAIFENESIN ER 30-600 MG PO TB12
1.0000 | ORAL_TABLET | Freq: Two times a day (BID) | ORAL | Status: DC
  Administered 2023-10-25 – 2023-10-26 (×3): 1 via ORAL
  Filled 2023-10-25 (×3): qty 1

## 2023-10-25 NOTE — Evaluation (Signed)
 Physical Therapy Evaluation Patient Details Name: Glenn [name] MRN: [id number] DOB: Jan 24, 1961 Today's Date: 10/25/2023  History of Present Illness  63 yo male admitted with COPD exac. Hx of sarcoidosis, DM, COPD-O2 dep 2L, asthma, anxiety, CVA, chronic pain  Clinical Impression  On eval, pt was Mod Ind with mobility. He walked ~350 feet without a device. O2: 88% on 2L, 90% on 3L, dyspnea 2/4 with ambulation . Wife asked about him practicing stairs while here. Will plan to follow pt during this hospital stay. No f/u PT needs after discharge.      If plan is discharge home, recommend the following: Help with stairs or ramp for entrance   Can travel by private vehicle        Equipment Recommendations None recommended by PT  Recommendations for Other Services       Functional Status Assessment Patient has had a recent decline in their functional status and demonstrates the ability to make significant improvements in function in a reasonable and predictable amount of time.     Precautions / Restrictions Precautions Precautions: Fall Precaution/Restrictions Comments: monitoir O2. Chart indicates O2 dep but pt reports wears O2 PRN Restrictions Weight Bearing Restrictions Per Provider Order: No      Mobility  Bed Mobility Overal bed mobility: Modified Independent                  Transfers Overall transfer level: Modified independent                      Ambulation/Gait Ambulation/Gait assistance: Modified independent (Device/Increase time) Gait Distance (Feet): 350 Feet Assistive device: None Gait Pattern/deviations: Step-through pattern       General Gait Details: O2 88% on 2L, 90% on 3L. Dyspnea 2/4. Tolerated distance well.  Stairs            Wheelchair Mobility     Tilt Bed    Modified Rankin (Stroke Patients Only)       Balance Overall balance assessment: Mild deficits observed, not formally tested                                            Pertinent Vitals/Pain Pain Assessment Pain Assessment: Faces Faces Pain Scale: Hurts a little bit Pain Location: back Pain Descriptors / Indicators: Aching, Discomfort Pain Intervention(s): Monitored during session    Home Living Family/patient expects to be discharged to:: Private residence Living Arrangements: Spouse/significant other Available Help at Discharge: Family Type of Home: Apartment Home Access: Stairs to enter Entrance Stairs-Rails: Right Entrance Stairs-Number of Steps: flight   Home Layout: One level Home Equipment: Cane - single point Additional Comments: pt reports he uses O2 PRN    Prior Function Prior Level of Function : Independent/Modified Independent             Mobility Comments: uses cane PRN. ADLs Comments: independent     Extremity/Trunk Assessment   Upper Extremity Assessment Upper Extremity Assessment: Overall WFL for tasks assessed    Lower Extremity Assessment Lower Extremity Assessment: Generalized weakness    Cervical / Trunk Assessment Cervical / Trunk Assessment: Normal  Communication   Communication Communication: No apparent difficulties    Cognition Arousal: Alert Behavior During Therapy: WFL for tasks assessed/performed   PT - Cognitive impairments: No apparent impairments  Following commands: Intact       Cueing Cueing Techniques: Verbal cues     General Comments      Exercises     Assessment/Plan    PT Assessment Patient needs continued PT services  PT Problem List Decreased activity tolerance;Decreased mobility       PT Treatment Interventions Stair training;Gait training;Functional mobility training;Patient/family education    PT Goals (Current goals can be found in the Care Plan section)  Acute Rehab PT Goals Patient Stated Goal: get better PT Goal Formulation: With patient/family Time For Goal Achievement: 11/08/23 Potential to  Achieve Goals: Good    Frequency Min 2X/week     Co-evaluation               AM-PAC PT 6 Clicks Mobility  Outcome Measure Help needed turning from your back to your side while in a flat bed without using bedrails?: None Help needed moving from lying on your back to sitting on the side of a flat bed without using bedrails?: None Help needed moving to and from a bed to a chair (including a wheelchair)?: None Help needed standing up from a chair using your arms (e.g., wheelchair or bedside chair)?: None Help needed to walk in hospital room?: None Help needed climbing 3-5 steps with a railing? : A Little 6 Click Score: 23    End of Session Equipment Utilized During Treatment: Oxygen  Activity Tolerance: Patient tolerated treatment well Patient left: in bed;with call bell/phone within reach;with family/visitor present        Time: 8554-8494 PT Time Calculation (min) (ACUTE ONLY): 20 min   Charges:   PT Evaluation $PT Eval Low Complexity: 1 Low   PT General Charges $$ ACUTE PT VISIT: 1 Visit           Dannial SQUIBB, PT Acute Rehabilitation  Office: [phone]

## 2023-10-25 NOTE — TOC Initial Note (Signed)
 Transition of Care [facility]) - Initial/Assessment Note    Patient Details  Name: Glenn [name] MRN: [id number] Date of Birth: [birth year]  Transition [facility]) CM/SW Contact:    Alfonse JONELLE Rex, RN Phone Number: 10/25/2023, 2:39 PM  Clinical Narrative:    Met with patient at bedside to introduce role of TOC/NCM and review for dc planning, patient confirmed he has an established PCP, no current home care services. Home DME: Home 02( patient has home portable home 02 at bedside), nebulizer, cpap. Patient reports he resides with his spouse and feels safe returning home. TOC will continue to follow.                Expected Discharge Plan: Home/Self Care Barriers to Discharge: Continued Medical Work up   Patient Goals and CMS Choice Patient states their goals for this hospitalization and ongoing recovery are:: return home          Expected Discharge Plan and Services       Living arrangements for the past 2 months: Single Family Home                                      Prior Living Arrangements/Services Living arrangements for the past 2 months: Single Family Home Lives with:: Spouse Patient language and need for interpreter reviewed:: Yes Do you feel safe going back to the place where you live?: Yes      Need for Family Participation in Patient Care: Yes (Comment) Care giver support system in place?: Yes (comment) Current home services: DME (Home 02, nebulizer, cpap) Criminal Activity/Legal Involvement Pertinent to Current Situation/Hospitalization: No - Comment as needed  Activities of Daily Living   ADL Screening (condition at time of admission) Independently performs ADLs?: Yes (appropriate for developmental age) Is the patient deaf or have difficulty hearing?: No Does the patient have difficulty seeing, even when wearing glasses/contacts?: No Does the patient have difficulty concentrating, remembering, or making decisions?: No  Permission Sought/Granted                   Emotional Assessment Appearance:: Appears stated age Attitude/Demeanor/Rapport: Engaged Affect (typically observed): Accepting Orientation: : Oriented to Self, Oriented to Place, Oriented to  Time, Oriented to Situation Alcohol / Substance Use: Not Applicable Psych Involvement: No (comment)  Admission diagnosis:  Sarcoidosis [D86.9] COPD exacerbation (HCC) [J44.1] COPD with acute exacerbation (HCC) [J44.1] Patient Active Problem List   Diagnosis Date Noted   Hyperkalemia 08/27/2023   Depression with anxiety 08/26/2023   Acute hypoxic respiratory failure (HCC) 07/19/2023   Viral pneumonia 01/11/2022   Influenza A 01/10/2022   CAP (community acquired pneumonia) 01/10/2022   BPH (benign prostatic hyperplasia) 01/10/2022   Sore throat 11/22/2021   Acute respiratory failure with hypoxia (HCC) 10/01/2021   History of CVA (cerebrovascular accident) 09/30/2021   Hyperlipidemia associated with type 2 diabetes mellitus (HCC) 09/30/2021   Personal history of sarcoidosis 09/30/2021   Asthma exacerbation 05/23/2021   Acute CVA (cerebrovascular accident) (HCC) 06/23/2020   Daytime sleepiness 06/09/2020   Malnutrition of moderate degree 05/07/2020   Chronic pain 05/06/2020   Tobacco dependence 05/06/2020   Hypertension associated with diabetes (HCC)    Type 2 diabetes mellitus (HCC)    COPD with acute exacerbation (HCC) 06/30/2019   Near syncope 06/30/2019   Pleural nodule 06/30/2019   Colon cancer screening 12/18/2017   History of colonic polyps 12/18/2017  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   COPD GOLD  II 10/05/2016   Tinnitus of left ear 09/13/2016   PCP:  [facility] Pharmacy:   [facility] - [place], [place] - [address] [place] [address] Phone: [phone] Fax: [phone]     Social Drivers of Health (SDOH) Social History: SDOH Screenings   Food Insecurity: No  Food Insecurity (10/24/2023)  Housing: Low Risk  (10/24/2023)  Transportation Needs: No Transportation Needs (10/24/2023)  Utilities: Not At Risk (10/24/2023)  Depression (PHQ2-9): Low Risk  (12/20/2021)  Social Connections: Socially Integrated (10/24/2023)  Tobacco Use: Medium Risk (10/24/2023)   SDOH Interventions:     Readmission Risk Interventions    08/28/2023   11:59 AM 07/20/2023    9:38 AM 01/13/2022    8:19 AM  Readmission Risk Prevention Plan  Transportation Screening Complete Complete Complete  PCP or Specialist Appt within 3-5 Days   Complete  Home Care Screening  Complete   Medication Review (RN CM)  Complete   HRI or Home Care Consult Complete  Complete  Social Work Consult for Recovery Care Planning/Counseling Complete  Complete  Palliative Care Screening Not Applicable  Not Applicable  Medication Review Oceanographer) Complete  Complete

## 2023-10-25 NOTE — Plan of Care (Signed)
  Problem: Clinical Measurements: Goal: Ability to maintain clinical measurements within normal limits will improve Outcome: Progressing Goal: Diagnostic test results will improve Outcome: Progressing Goal: Respiratory complications will improve Outcome: Progressing Goal: Cardiovascular complication will be avoided Outcome: Progressing   Problem: Pain Managment: Goal: General experience of comfort will improve and/or be controlled Outcome: Progressing   Problem: Safety: Goal: Ability to remain free from injury will improve Outcome: Progressing

## 2023-10-25 NOTE — Progress Notes (Signed)
 PROGRESS NOTE    Glenn [name]  FMW:[id number] DOB: Sep 22, 1960 DOA: 10/24/2023 PCP: [facility]    Brief Narrative:  63 year old with history of COPD, sarcoidosis, chronic hypoxemia on 2 L oxygen  at home, type 2 diabetes on metformin , hypertension hyperlipidemia presents with about 1 month of ongoing shortness of breath, worse for the last few days.  Recent multiple admissions.  In the emergency room, hemodynamically stable.  On 2 L oxygen , however found to be significantly wheezing so admitted to the hospital.  Subjective: Patient seen and examined.  He looks comfortable.  On interview, he is exhibiting anxiety and shortness of breath.  He has some expiratory wheezes.  Not mobilized yet. Assessment & Plan:   COPD with acute exacerbation: Agree with admission to monitored unit because of severity of symptoms. Aggressive bronchodilator therapy, IV steroids, inhalational steroids, scheduled and as needed bronchodilators, deep breathing exercises, incentive spirometry, chest physiotherapy and respiratory therapy consult. Currently no indication for antibiotics. Supplemental oxygen  to keep saturations more than 90%. Mobilize in the hallway.  Consult physical therapy.  Chronic medical issues Hypertension, stable Chronic hypoxemia, on 2 L oxygen  and at about baseline. Chronic pain syndrome, on OxyContin , oxycodone  and bowel regimen.  Continued. Type 2 diabetes, on carb modified diet and metformin  at home.  He has stated allergy  to insulin .  Anticipate hyperglycemia with the steroids. History of stroke, stable on Plavix . BPH, stable on Flomax . Depression anxiety, on Zoloft . Hyperlipidemia, statin.    DVT prophylaxis: SCDs Start: 10/24/23 1352   Code Status: Full code Family Communication: None at bedside Disposition Plan: Status is: Observation The patient will require care spanning > 2 midnights and should be moved to inpatient because: Persistently symptomatic with COPD,  requiring inpatient admission and monitoring, respiratory therapies.     Consultants:  None  Procedures:  None  Antimicrobials:  None     Objective: Vitals:   10/24/23 2311 10/25/23 0600 10/25/23 0753 10/25/23 1039  BP: (!) 140/87 (!) 142/98  (!) 139/98  Pulse: 93 87    Resp: 18   18  Temp:    97.7 F (36.5 C)  TempSrc:    Oral  SpO2: 94% 97% 96% 99%  Weight:      Height:        Intake/Output Summary (Last 24 hours) at 10/25/2023 1132 Last data filed at 10/25/2023 1000 Gross per 24 hour  Intake 1060 ml  Output 175 ml  Net 885 ml   Filed Weights   10/24/23 0850 10/24/23 1500  Weight: 65.8 kg 65.7 kg    Examination:  General exam: Appears calm and comfortable on approach to interview.  Slightly anxious. Respiratory system: Scattered expiratory wheezes on exam.  Not in any distress.  On 2 L oxygen . Cardiovascular system: S1 & S2 heard, RRR. No JVD, murmurs, rubs, gallops or clicks. No pedal edema. Gastrointestinal system: Abdomen is nondistended, soft and nontender. No organomegaly or masses felt. Normal bowel sounds heard. Central nervous system: Alert and oriented. No focal neurological deficits. Extremities: Symmetric 5 x 5 power. Flat affect.   Data Reviewed: I have personally reviewed following labs and imaging studies  CBC: Recent Labs  Lab 10/24/23 0920 10/25/23 0452  WBC 10.2 8.1  HGB 11.7* 11.7*  HCT 38.1* 39.8  MCV 82.6 84.0  PLT 340 347   Basic Metabolic Panel: Recent Labs  Lab 10/24/23 0920 10/25/23 0452  NA 138 137  K 3.6 4.6  CL 97* 98  CO2 30 30  GLUCOSE 240* 160*  BUN 11 14  CREATININE 0.71 0.57*  CALCIUM  9.7 9.5   GFR: Estimated Creatinine Clearance: 81.1 mL/min (A) (by C-G formula based on SCr of 0.57 mg/dL (L)). Liver Function Tests: No results for input(s): AST, ALT, ALKPHOS, BILITOT, PROT, ALBUMIN in the last 168 hours. No results for input(s): LIPASE, AMYLASE in the last 168 hours. No results for  input(s): AMMONIA in the last 168 hours. Coagulation Profile: No results for input(s): INR, PROTIME in the last 168 hours. Cardiac Enzymes: No results for input(s): CKTOTAL, CKMB, CKMBINDEX, TROPONINI in the last 168 hours. BNP (last 3 results) Recent Labs    10/24/23 0920  PROBNP 240.0   HbA1C: No results for input(s): HGBA1C in the last 72 hours. CBG: No results for input(s): GLUCAP in the last 168 hours. Lipid Profile: No results for input(s): CHOL, HDL, LDLCALC, TRIG, CHOLHDL, LDLDIRECT in the last 72 hours. Thyroid Function Tests: No results for input(s): TSH, T4TOTAL, FREET4, T3FREE, THYROIDAB in the last 72 hours. Anemia Panel: No results for input(s): VITAMINB12, FOLATE, FERRITIN, TIBC, IRON, RETICCTPCT in the last 72 hours. Sepsis Labs: No results for input(s): PROCALCITON, LATICACIDVEN in the last 168 hours.  Recent Results (from the past 240 hours)  Resp panel by RT-PCR (RSV, Flu A&B, Covid) Anterior Nasal Swab     Status: None   Collection Time: 10/24/23  9:20 AM   Specimen: Anterior Nasal Swab  Result Value Ref Range Status   SARS Coronavirus 2 by RT PCR NEGATIVE NEGATIVE Final    Comment: (NOTE) SARS-CoV-2 target nucleic acids are NOT DETECTED.  The SARS-CoV-2 RNA is generally detectable in upper respiratory specimens during the acute phase of infection. The lowest concentration of SARS-CoV-2 viral copies this assay can detect is 138 copies/mL. A negative result does not preclude SARS-Cov-2 infection and should not be used as the sole basis for treatment or other patient management decisions. A negative result may occur with  improper specimen collection/handling, submission of specimen other than nasopharyngeal swab, presence of viral mutation(s) within the areas targeted by this assay, and inadequate number of viral copies(<138 copies/mL). A negative result must be combined with clinical observations,  patient history, and epidemiological information. The expected result is Negative.  Fact Sheet for Patients:  BloggerCourse.com  Fact Sheet for Healthcare Providers:  SeriousBroker.it  This test is no t yet approved or cleared by the [place]  FDA and  has been authorized for detection and/or diagnosis of SARS-CoV-2 by FDA under an Emergency Use Authorization (EUA). This EUA will remain  in effect (meaning this test can be used) for the duration of the COVID-19 declaration under Section 564(b)(1) of the Act, 21 U.S.C.section 360bbb-3(b)(1), unless the authorization is terminated  or revoked sooner.       Influenza A by PCR NEGATIVE NEGATIVE Final   Influenza B by PCR NEGATIVE NEGATIVE Final    Comment: (NOTE) The Xpert Xpress SARS-CoV-2/FLU/RSV plus assay is intended as an aid in the diagnosis of influenza from Nasopharyngeal swab specimens and should not be used as a sole basis for treatment. Nasal washings and aspirates are unacceptable for Xpert Xpress SARS-CoV-2/FLU/RSV testing.  Fact Sheet for Patients: BloggerCourse.com  Fact Sheet for Healthcare Providers: SeriousBroker.it  This test is not yet approved or cleared by the [place]  FDA and has been authorized for detection and/or diagnosis of SARS-CoV-2 by FDA under an Emergency Use Authorization (EUA). This EUA will remain in effect (meaning this test can be used) for the duration of the COVID-19 declaration under  Section 564(b)(1) of the Act, 21 U.S.C. section 360bbb-3(b)(1), unless the authorization is terminated or revoked.     Resp Syncytial Virus by PCR NEGATIVE NEGATIVE Final    Comment: (NOTE) Fact Sheet for Patients: BloggerCourse.com  Fact Sheet for Healthcare Providers: SeriousBroker.it  This test is not yet approved or cleared by the [place] FDA and has been authorized for detection and/or diagnosis of SARS-CoV-2 by FDA under an Emergency Use Authorization (EUA). This EUA will remain in effect (meaning this test can be used) for the duration of the COVID-19 declaration under Section 564(b)(1) of the Act, 21 U.S.C. section 360bbb-3(b)(1), unless the authorization is terminated or revoked.  Performed at [facility], [address], [place], [place] [address]          Radiology Studies: DG Chest 1 View Result Date: 10/24/2023 EXAM: 1 VIEW XRAY OF THE CHEST 10/24/2023 09:30:00 AM COMPARISON: 08/26/2023 CLINICAL HISTORY: Arrives POV with complaints of worsening shortness of breath and cough x2 weeks. Patient has a hx of COPD and wears 2L of oxygen  at baseline. Did two nebulizer treatments prior to arrival. FINDINGS: LUNGS AND PLEURA: Chronic bilateral interstitial changes. Bilateral upper lung postsurgical changes. No focal pulmonary opacity. No pulmonary edema. No pleural effusion. No pneumothorax. HEART AND MEDIASTINUM: No acute abnormality of the cardiac and mediastinal silhouettes. BONES AND SOFT TISSUES: Surgical clips and sutures in left hemithorax. Persistent elevated left hemidiaphragm. No acute osseous abnormality. IMPRESSION: 1. No acute findings. 2. Persistent elevated left hemidiaphragm, chronic bilateral interstitial changes, and bilateral upper lung postsurgical changes. Electronically signed by: Taylor Stroud MD 10/24/2023 09:45 AM EDT RP Workstation: [id number]        Scheduled Meds:  amLODipine   10 mg Oral Daily   atorvastatin   40 mg Oral Daily   clopidogrel   75 mg Oral Daily   dextromethorphan -guaiFENesin   1 tablet Oral BID   fluticasone   2 spray Each Nare Daily   glipiZIDE   5 mg Oral QAC breakfast   ipratropium-albuterol   3 mL Nebulization QID   loratadine   10 mg Oral Daily   metFORMIN   500 mg Oral BID WC   methylPREDNISolone  (SOLU-MEDROL ) injection  40 mg Intravenous Q12H    oxyCODONE   10 mg Oral Q12H   pantoprazole   40 mg Oral Daily   polyethylene glycol  17 g Oral Daily   prazosin   1 mg Oral QHS   senna-docusate  1 tablet Oral BID   sertraline   50 mg Oral Daily   tamsulosin   0.4 mg Oral Daily   Continuous Infusions:  albuterol        LOS: 0 days    Time spent: 51 minutes    Renato Applebaum, MD [facility]

## 2023-10-26 ENCOUNTER — Other Ambulatory Visit (HOSPITAL_COMMUNITY): Payer: Self-pay

## 2023-10-26 DIAGNOSIS — J441 Chronic obstructive pulmonary disease with (acute) exacerbation: Secondary | ICD-10-CM | POA: Diagnosis not present

## 2023-10-26 MED ORDER — DM-GUAIFENESIN ER 30-600 MG PO TB12
1.0000 | ORAL_TABLET | Freq: Two times a day (BID) | ORAL | 0 refills | Status: DC
  Filled 2023-10-26: qty 60, 30d supply, fill #0

## 2023-10-26 MED ORDER — PREDNISONE 10 MG PO TABS
ORAL_TABLET | ORAL | 0 refills | Status: AC
Start: 2023-10-26 — End: 2023-11-03
  Filled 2023-10-26: qty 20, 8d supply, fill #0

## 2023-10-26 MED ORDER — PROMETHAZINE-DM 6.25-15 MG/5ML PO SYRP
2.5000 mL | ORAL_SOLUTION | Freq: Four times a day (QID) | ORAL | 0 refills | Status: DC | PRN
  Filled 2023-10-26: qty 118, 12d supply, fill #0

## 2023-10-26 NOTE — Progress Notes (Signed)
 Mobility Specialist - Progress Note   Pre-mobility: 93% SpO2 (RA) During mobility: 85% SpO2 (RA) During mobility: 95% SpO2 ([place] 2 L) Post-mobility: 96% SPO2 ([place] 2 L)    10/26/23 1106  Mobility  Activity Ambulated independently  Level of Assistance Independent  Assistive Device None  Distance Ambulated (ft) 500 ft  Range of Motion/Exercises Active  Activity Response Tolerated well  Mobility Referral Yes  Mobility visit 1 Mobility  Mobility Specialist Start Time (ACUTE ONLY) 1045  Mobility Specialist Stop Time (ACUTE ONLY) 1105  Mobility Specialist Time Calculation (min) (ACUTE ONLY) 20 min     Pt was received in bed on RA and agreed to mobility. Took x2 brief standing rest breaks during ambulation due to decreased SpO2. Returned to room with all needs met.  [employer] - Mobility Specialist

## 2023-10-26 NOTE — Discharge Summary (Signed)
 Physician Discharge Summary  RUSELL MENEELY FMW:[id number] DOB: 03-Oct-1960 DOA: 10/24/2023  PCP: [facility]  Admit date: 10/24/2023 Discharge date: 10/26/2023  Admitted From: Home Disposition: Home  Recommendations for Outpatient Follow-up:  Follow up with PCP in 1-2 weeks Please obtain BMP/CBC in one week   Home Health: N/A Equipment/Devices: N/A.  Patient has oxygen  at home.  Discharge Condition: Stable CODE STATUS: Full code Diet recommendation: Low-carb diet  Discharge summary: 63 year old with history of COPD, sarcoidosis, chronic hypoxemia on 2 L oxygen  at home, type 2 diabetes on metformin , hypertension hyperlipidemia presents with about 1 month of ongoing shortness of breath, worse for the last few days.  Recent multiple admissions.  In the emergency room, hemodynamically stable.  On 2 L oxygen , however found to be significantly wheezing so admitted to the hospital.  Patient was admitted and treated for COPD exacerbation.  # COPD with acute exacerbation: Treated with bronchodilator therapy, IV steroids, inhalers and steroids and chest physiotherapy.  Started on scheduled Mucinex  and cough medications.  Clinically improved.  No evidence of infection. Going home with short course of prednisone  taper.  Optimized on COPD management. Continue to use incentive spirometry at home. Use oxygen  with mobility. Discussed about managing symptoms at home including using more frequent nebulizer if needed for shortness of breath and unrelenting cough. Will put patient on a scheduled Claritin , Mucinex  Huebsch-acting.  Also prescribed short acting cough medications.   Chronic medical issues Hypertension, stable Chronic hypoxemia, on 2 L oxygen  and at about baseline. Chronic pain syndrome, on OxyContin , oxycodone  and bowel regimen.  Continued. Type 2 diabetes, on carb modified diet and metformin  at home.  He has stated allergy  to insulin .  Anticipate hyperglycemia with the steroids.   Quick tapering off of the steroids. History of stroke, stable on Plavix . BPH, stable on Flomax . Depression anxiety, on Zoloft . Hyperlipidemia, statin.  Stable to discharge.   Discharge Diagnoses:  Principal Problem:   COPD with acute exacerbation [facility])    Discharge Instructions  Discharge Instructions     Diet - low sodium heart healthy   Complete by: As directed    Diet Carb Modified   Complete by: As directed    Increase activity slowly   Complete by: As directed       Allergies as of 10/26/2023       Reactions   Insulins Swelling, Other (See Comments)   04/2020. Do not use Novolog  insulin . Swelling of tongue   Pork-derived Products Nausea And Vomiting, Other (See Comments)   Cultural-Muslim and NOT TOLERATED, AS WELL   Acetaminophen  Nausea Only, Other (See Comments)   Abdominal sensitivity and Abdominal discomfort   Ibuprofen Other (See Comments)   Abdominal sensitivity/Abdominal discomfort   Lisinopril Swelling, Rash, Other (See Comments)   Tongue swelling   Shrimp [shellfish Allergy ] Itching   Insulin  Aspart Swelling, Other (See Comments)   Tongue swelling   Amoxicillin -pot Clavulanate Nausea And Vomiting        Medication List     STOP taking these medications    atorvastatin  40 MG tablet Commonly known as: LIPITOR       TAKE these medications    amLODipine  10 MG tablet Commonly known as: NORVASC  Take 1 tablet (10 mg total) by mouth daily. What changed:  when to take this additional instructions   aspirin  EC 81 MG tablet Take 81 mg by mouth daily. Swallow whole.   clopidogrel  75 MG tablet Commonly known as: PLAVIX  Take 1 tablet (75 mg total) by  mouth daily.   docusate sodium  100 MG capsule Commonly known as: COLACE Take 100 mg by mouth 2 (two) times daily as needed for mild constipation.   EPINEPHrine  0.3 mg/0.3 mL Soaj injection Commonly known as: EPI-PEN Inject 0.3 mg into the muscle as needed for anaphylaxis.   famotidine  20  MG tablet Commonly known as: Pepcid  One after supper What changed:  how much to take how to take this when to take this reasons to take this additional instructions   ferrous sulfate  325 (65 FE) MG tablet Take 325 mg by mouth every Monday, Wednesday, and Friday.   fluticasone  50 MCG/ACT nasal spray Commonly known as: FLONASE  Place 2 sprays into both nostrils daily for 10 days. What changed:  when to take this reasons to take this   guaiFENesin  600 MG 12 hr tablet Commonly known as: MUCINEX  Take 600 mg by mouth in the morning.   hydrOXYzine  25 MG tablet Commonly known as: ATARAX  Take 25 mg by mouth at bedtime.   ipratropium-albuterol  0.5-2.5 (3) MG/3ML Soln Commonly known as: DUONEB Take 3 mLs by nebulization 3 (three) times daily for 5 days, THEN 3 mLs every 6 (six) hours as needed for up to 5 days. Start taking on: August 29, 2023 What changed: See the new instructions.   loratadine  10 MG tablet Commonly known as: Claritin  Take 1 tablet (10 mg total) by mouth daily.   metFORMIN  500 MG tablet Commonly known as: GLUCOPHAGE  Take 1 tablet (500 mg total) by mouth 2 (two) times daily with a meal. What changed: when to take this   montelukast  10 MG tablet Commonly known as: SINGULAIR  Take 10 mg by mouth at bedtime.   Mucus Relief DM 30-600 MG Tb12 Take 1 tablet by mouth 2 (two) times daily.   naloxone  4 MG/0.1ML Liqd nasal spray kit Commonly known as: NARCAN  Place 1 spray into the nose as needed (opioid overdose - respiratory distress).   OxyCONTIN  10 mg 12 hr tablet Generic drug: oxyCODONE  Take 10 mg by mouth every 12 (twelve) hours.   Oxycodone  HCl 20 MG Tabs Take 20 mg by mouth every 6 (six) hours as needed (pain).   OXYGEN  Inhale 2-3 L/min into the lungs as needed (for shortness of breath when exerted).   pantoprazole  40 MG tablet Commonly known as: PROTONIX  Take 1 tablet (40 mg total) by mouth daily.   polyethylene glycol 17 g packet Commonly known  as: MIRALAX  / GLYCOLAX  Take 17 g by mouth daily. What changed:  when to take this reasons to take this   pravastatin 10 MG tablet Commonly known as: PRAVACHOL Take 10 mg by mouth at bedtime.   prazosin  1 MG capsule Commonly known as: MINIPRESS  Take 1 mg by mouth at bedtime.   predniSONE  10 MG tablet Commonly known as: DELTASONE  Take 4 tablets (40 mg total) by mouth daily for 2 days, THEN 3 tablets (30 mg total) daily for 2 days, THEN 2 tablets (20 mg total) daily for 2 days, THEN 1 tablet (10 mg total) daily for 2 days. Start taking on: October 26, 2023   promethazine -dextromethorphan  6.25-15 MG/5ML syrup Commonly known as: PROMETHAZINE -DM Take 10 mLs by mouth 2 (two) times daily as needed for cough. What changed: Another medication with the same name was added. Make sure you understand how and when to take each.   promethazine -dextromethorphan  6.25-15 MG/5ML syrup Commonly known as: PROMETHAZINE -DM Take 2.5 mLs by mouth 4 (four) times daily as needed for cough. What changed: You were already  taking a medication with the same name, and this prescription was added. Make sure you understand how and when to take each.   senna-docusate 8.6-50 MG tablet Commonly known as: Senokot-S Take 1 tablet by mouth 2 (two) times daily. What changed:  when to take this reasons to take this   sildenafil  100 MG tablet Commonly known as: VIAGRA  Take 1 tablet (100 mg total) by mouth daily as needed. What changed: reasons to take this   Symbicort  160-4.5 MCG/ACT inhaler Generic drug: budesonide -formoterol  Inhale 2 puffs into the lungs 2 (two) times daily.   tamsulosin  0.4 MG Caps capsule Commonly known as: FLOMAX  Take 1 capsule (0.4 mg total) by mouth daily.   Tiotropium Bromide  Monohydrate 2.5 MCG/ACT Aers Inhale 2 puffs into the lungs daily.   Ventolin  HFA 108 (90 Base) MCG/ACT inhaler Generic drug: albuterol  Inhale 1-2 puffs into the lungs every 6 (six) hours as needed for  wheezing or shortness of breath. What changed:  how much to take when to take this   vitamin C 250 MG tablet Commonly known as: ASCORBIC ACID  Take 250 mg by mouth daily.   Vitamin D3 50 MCG (2000 UT) Chew Chew 4,000 Units by mouth daily.        Allergies  Allergen Reactions   Insulins Swelling and Other (See Comments)    04/2020. Do not use Novolog  insulin . Swelling of tongue   Pork-Derived Products Nausea And Vomiting and Other (See Comments)    Cultural-Muslim and NOT TOLERATED, AS WELL   Acetaminophen  Nausea Only and Other (See Comments)    Abdominal sensitivity and Abdominal discomfort   Ibuprofen Other (See Comments)    Abdominal sensitivity/Abdominal discomfort   Lisinopril Swelling, Rash and Other (See Comments)    Tongue swelling   Shrimp [Shellfish Allergy ] Itching   Insulin  Aspart Swelling and Other (See Comments)    Tongue swelling   Amoxicillin -Pot Clavulanate Nausea And Vomiting    Consultations: None   Procedures/Studies: DG Chest 1 View Result Date: 10/24/2023 EXAM: 1 VIEW XRAY OF THE CHEST 10/24/2023 09:30:00 AM COMPARISON: 08/26/2023 CLINICAL HISTORY: Arrives POV with complaints of worsening shortness of breath and cough x2 weeks. Patient has a hx of COPD and wears 2L of oxygen  at baseline. Did two nebulizer treatments prior to arrival. FINDINGS: LUNGS AND PLEURA: Chronic bilateral interstitial changes. Bilateral upper lung postsurgical changes. No focal pulmonary opacity. No pulmonary edema. No pleural effusion. No pneumothorax. HEART AND MEDIASTINUM: No acute abnormality of the cardiac and mediastinal silhouettes. BONES AND SOFT TISSUES: Surgical clips and sutures in left hemithorax. Persistent elevated left hemidiaphragm. No acute osseous abnormality. IMPRESSION: 1. No acute findings. 2. Persistent elevated left hemidiaphragm, chronic bilateral interstitial changes, and bilateral upper lung postsurgical changes. Electronically signed by: Waddell Calk MD  10/24/2023 09:45 AM EDT RP Workstation: [id number]   (Echo, Carotid, EGD, Colonoscopy, ERCP)    Subjective: Patient seen and examined.  He was being mobilized in the hallway by mobility technicians.  He walked all along the unit with minimal cough and mild dyspnea at the end of the walk however remains overall stable.  He is seen comfortable, talking with the staff and walking around.  On 2 L oxygen  with mobility.  He dropped to 85% oxygen  on room air while walking. He does have this intermittent cough that is most bothersome.   Discharge Exam: Vitals:   10/26/23 0547 10/26/23 0757  BP: (!) 160/95   Pulse: 89   Resp: 15   Temp: 97.7 F (36.5 C)  SpO2: 98% 99%   Vitals:   10/25/23 1957 10/25/23 2027 10/26/23 0547 10/26/23 0757  BP:  (!) 158/102 (!) 160/95   Pulse:  96 89   Resp:  18 15   Temp:  97.6 F (36.4 C) 97.7 F (36.5 C)   TempSrc:  Oral    SpO2: 99% 99% 98% 99%  Weight:      Height:        General: Pt is alert, awake, not in acute distress.  Pleasant interactive.  Walking around the hallway. Cardiovascular: RRR, S1/S2 +, no rubs, no gallops Respiratory: CTA bilaterally, no wheezing, no rhonchi, occasional expiratory wheezing.  Not in any distress.  On 2 L oxygen . Abdominal: Soft, NT, ND, bowel sounds + Extremities: no edema, no cyanosis    The results of significant diagnostics from this hospitalization (including imaging, microbiology, ancillary and laboratory) are listed below for reference.     Microbiology: Recent Results (from the past 240 hours)  Resp panel by RT-PCR (RSV, Flu A&B, Covid) Anterior Nasal Swab     Status: None   Collection Time: 10/24/23  9:20 AM   Specimen: Anterior Nasal Swab  Result Value Ref Range Status   SARS Coronavirus 2 by RT PCR NEGATIVE NEGATIVE Final    Comment: (NOTE) SARS-CoV-2 target nucleic acids are NOT DETECTED.  The SARS-CoV-2 RNA is generally detectable in upper respiratory specimens during the acute phase of  infection. The lowest concentration of SARS-CoV-2 viral copies this assay can detect is 138 copies/mL. A negative result does not preclude SARS-Cov-2 infection and should not be used as the sole basis for treatment or other patient management decisions. A negative result may occur with  improper specimen collection/handling, submission of specimen other than nasopharyngeal swab, presence of viral mutation(s) within the areas targeted by this assay, and inadequate number of viral copies(<138 copies/mL). A negative result must be combined with clinical observations, patient history, and epidemiological information. The expected result is Negative.  Fact Sheet for Patients:  BloggerCourse.com  Fact Sheet for Healthcare Providers:  SeriousBroker.it  This test is no t yet approved or cleared by the [place]  FDA and  has been authorized for detection and/or diagnosis of SARS-CoV-2 by FDA under an Emergency Use Authorization (EUA). This EUA will remain  in effect (meaning this test can be used) for the duration of the COVID-19 declaration under Section 564(b)(1) of the Act, 21 U.S.C.section 360bbb-3(b)(1), unless the authorization is terminated  or revoked sooner.       Influenza A by PCR NEGATIVE NEGATIVE Final   Influenza B by PCR NEGATIVE NEGATIVE Final    Comment: (NOTE) The Xpert Xpress SARS-CoV-2/FLU/RSV plus assay is intended as an aid in the diagnosis of influenza from Nasopharyngeal swab specimens and should not be used as a sole basis for treatment. Nasal washings and aspirates are unacceptable for Xpert Xpress SARS-CoV-2/FLU/RSV testing.  Fact Sheet for Patients: BloggerCourse.com  Fact Sheet for Healthcare Providers: SeriousBroker.it  This test is not yet approved or cleared by the [place]  FDA and has been authorized for detection and/or diagnosis of SARS-CoV-2  by FDA under an Emergency Use Authorization (EUA). This EUA will remain in effect (meaning this test can be used) for the duration of the COVID-19 declaration under Section 564(b)(1) of the Act, 21 U.S.C. section 360bbb-3(b)(1), unless the authorization is terminated or revoked.     Resp Syncytial Virus by PCR NEGATIVE NEGATIVE Final    Comment: (NOTE) Fact Sheet for Patients: BloggerCourse.com  Fact Sheet  for Healthcare Providers: SeriousBroker.it  This test is not yet approved or cleared by the [place]  FDA and has been authorized for detection and/or diagnosis of SARS-CoV-2 by FDA under an Emergency Use Authorization (EUA). This EUA will remain in effect (meaning this test can be used) for the duration of the COVID-19 declaration under Section 564(b)(1) of the Act, 21 U.S.C. section 360bbb-3(b)(1), unless the authorization is terminated or revoked.  Performed at [facility], [address] [place], [place] [address]      Labs: BNP (last 3 results) Recent Labs    03/25/23 0102 07/20/23 0959  BNP 38.0 76.1   Basic Metabolic Panel: Recent Labs  Lab 10/24/23 0920 10/25/23 0452  NA 138 137  K 3.6 4.6  CL 97* 98  CO2 30 30  GLUCOSE 240* 160*  BUN 11 14  CREATININE 0.71 0.57*  CALCIUM  9.7 9.5   Liver Function Tests: No results for input(s): AST, ALT, ALKPHOS, BILITOT, PROT, ALBUMIN in the last 168 hours. No results for input(s): LIPASE, AMYLASE in the last 168 hours. No results for input(s): AMMONIA in the last 168 hours. CBC: Recent Labs  Lab 10/24/23 0920 10/25/23 0452  WBC 10.2 8.1  HGB 11.7* 11.7*  HCT 38.1* 39.8  MCV 82.6 84.0  PLT 340 347   Cardiac Enzymes: No results for input(s): CKTOTAL, CKMB, CKMBINDEX, TROPONINI in the last 168 hours. BNP: Invalid input(s): POCBNP CBG: No results for input(s): GLUCAP in the last 168 hours. D-Dimer No  results for input(s): DDIMER in the last 72 hours. Hgb A1c No results for input(s): HGBA1C in the last 72 hours. Lipid Profile No results for input(s): CHOL, HDL, LDLCALC, TRIG, CHOLHDL, LDLDIRECT in the last 72 hours. Thyroid function studies No results for input(s): TSH, T4TOTAL, T3FREE, THYROIDAB in the last 72 hours.  Invalid input(s): FREET3 Anemia work up No results for input(s): VITAMINB12, FOLATE, FERRITIN, TIBC, IRON, RETICCTPCT in the last 72 hours. Urinalysis    Component Value Date/Time   COLORURINE YELLOW 01/15/2022 0509   APPEARANCEUR CLOUDY (A) 01/15/2022 0509   LABSPEC 1.015 01/15/2022 0509   PHURINE 7.0 01/15/2022 0509   GLUCOSEU NEGATIVE 01/15/2022 0509   HGBUR NEGATIVE 01/15/2022 0509   BILIRUBINUR NEGATIVE 01/15/2022 0509   KETONESUR NEGATIVE 01/15/2022 0509   PROTEINUR NEGATIVE 01/15/2022 0509   NITRITE NEGATIVE 01/15/2022 0509   LEUKOCYTESUR NEGATIVE 01/15/2022 0509   Sepsis Labs Recent Labs  Lab 10/24/23 0920 10/25/23 0452  WBC 10.2 8.1   Microbiology Recent Results (from the past 240 hours)  Resp panel by RT-PCR (RSV, Flu A&B, Covid) Anterior Nasal Swab     Status: None   Collection Time: 10/24/23  9:20 AM   Specimen: Anterior Nasal Swab  Result Value Ref Range Status   SARS Coronavirus 2 by RT PCR NEGATIVE NEGATIVE Final    Comment: (NOTE) SARS-CoV-2 target nucleic acids are NOT DETECTED.  The SARS-CoV-2 RNA is generally detectable in upper respiratory specimens during the acute phase of infection. The lowest concentration of SARS-CoV-2 viral copies this assay can detect is 138 copies/mL. A negative result does not preclude SARS-Cov-2 infection and should not be used as the sole basis for treatment or other patient management decisions. A negative result may occur with  improper specimen collection/handling, submission of specimen other than nasopharyngeal swab, presence of viral mutation(s) within  the areas targeted by this assay, and inadequate number of viral copies(<138 copies/mL). A negative result must be combined with clinical observations, patient history, and epidemiological information.  The expected result is Negative.  Fact Sheet for Patients:  BloggerCourse.com  Fact Sheet for Healthcare Providers:  SeriousBroker.it  This test is no t yet approved or cleared by the [place]  FDA and  has been authorized for detection and/or diagnosis of SARS-CoV-2 by FDA under an Emergency Use Authorization (EUA). This EUA will remain  in effect (meaning this test can be used) for the duration of the COVID-19 declaration under Section 564(b)(1) of the Act, 21 U.S.C.section 360bbb-3(b)(1), unless the authorization is terminated  or revoked sooner.       Influenza A by PCR NEGATIVE NEGATIVE Final   Influenza B by PCR NEGATIVE NEGATIVE Final    Comment: (NOTE) The Xpert Xpress SARS-CoV-2/FLU/RSV plus assay is intended as an aid in the diagnosis of influenza from Nasopharyngeal swab specimens and should not be used as a sole basis for treatment. Nasal washings and aspirates are unacceptable for Xpert Xpress SARS-CoV-2/FLU/RSV testing.  Fact Sheet for Patients: BloggerCourse.com  Fact Sheet for Healthcare Providers: SeriousBroker.it  This test is not yet approved or cleared by the [place]  FDA and has been authorized for detection and/or diagnosis of SARS-CoV-2 by FDA under an Emergency Use Authorization (EUA). This EUA will remain in effect (meaning this test can be used) for the duration of the COVID-19 declaration under Section 564(b)(1) of the Act, 21 U.S.C. section 360bbb-3(b)(1), unless the authorization is terminated or revoked.     Resp Syncytial Virus by PCR NEGATIVE NEGATIVE Final    Comment: (NOTE) Fact Sheet for  Patients: BloggerCourse.com  Fact Sheet for Healthcare Providers: SeriousBroker.it  This test is not yet approved or cleared by the [place]  FDA and has been authorized for detection and/or diagnosis of SARS-CoV-2 by FDA under an Emergency Use Authorization (EUA). This EUA will remain in effect (meaning this test can be used) for the duration of the COVID-19 declaration under Section 564(b)(1) of the Act, 21 U.S.C. section 360bbb-3(b)(1), unless the authorization is terminated or revoked.  Performed at [facility], [address] [place], [place] [address]      Time coordinating discharge: 35 minutes  SIGNED:   Renato Applebaum, MD  [facility] 10/26/2023, 1:34 PM

## 2023-10-26 NOTE — Plan of Care (Signed)
  Problem: Education: Goal: Knowledge of General Education information will improve Description: Including pain rating scale, medication(s)/side effects and non-pharmacologic comfort measures Outcome: Adequate for Discharge   Problem: Health Behavior/Discharge Planning: Goal: Ability to manage health-related needs will improve Outcome: Adequate for Discharge   Problem: Clinical Measurements: Goal: Ability to maintain clinical measurements within normal limits will improve Outcome: Adequate for Discharge Goal: Will remain free from infection Outcome: Adequate for Discharge Goal: Diagnostic test results will improve Outcome: Adequate for Discharge Goal: Respiratory complications will improve Outcome: Adequate for Discharge Goal: Cardiovascular complication will be avoided Outcome: Adequate for Discharge   Problem: Activity: Goal: Risk for activity intolerance will decrease Outcome: Adequate for Discharge   Problem: Coping: Goal: Level of anxiety will decrease Outcome: Adequate for Discharge   Problem: Pain Managment: Goal: General experience of comfort will improve and/or be controlled Outcome: Adequate for Discharge   Problem: Safety: Goal: Ability to remain free from injury will improve Outcome: Adequate for Discharge   Problem: Skin Integrity: Goal: Risk for impaired skin integrity will decrease Outcome: Adequate for Discharge

## 2023-10-26 NOTE — Progress Notes (Signed)
 Nurse reviewed discharge instructions with pt. Pt verbalized understanding of new medications and discharge instructions. No concerns at time of discharge.

## 2023-10-26 NOTE — Plan of Care (Signed)
  Problem: Activity: Goal: Risk for activity intolerance will decrease Outcome: Progressing   Problem: Nutrition: Goal: Adequate nutrition will be maintained Outcome: Completed/Met   Problem: Elimination: Goal: Will not experience complications related to bowel motility Outcome: Completed/Met   Problem: Elimination: Goal: Will not experience complications related to urinary retention Outcome: Completed/Met

## 2023-11-09 ENCOUNTER — Ambulatory Visit: Admitting: Internal Medicine

## 2023-11-09 ENCOUNTER — Encounter: Payer: Self-pay | Admitting: Internal Medicine

## 2023-11-09 VITALS — BP 153/93 | HR 97 | Temp 98.0°F | Ht 65.0 in | Wt 143.6 lb

## 2023-11-09 DIAGNOSIS — J449 Chronic obstructive pulmonary disease, unspecified: Secondary | ICD-10-CM | POA: Diagnosis not present

## 2023-11-09 DIAGNOSIS — J9611 Chronic respiratory failure with hypoxia: Secondary | ICD-10-CM | POA: Diagnosis not present

## 2023-11-09 DIAGNOSIS — Z87891 Personal history of nicotine dependence: Secondary | ICD-10-CM

## 2023-11-09 DIAGNOSIS — Z9981 Dependence on supplemental oxygen: Secondary | ICD-10-CM

## 2023-11-09 MED ORDER — PREDNISONE 10 MG PO TABS: ORAL_TABLET | ORAL | 0 refills | Status: DC

## 2023-11-09 NOTE — Patient Instructions (Addendum)
 Make sure you check your oxygen  saturation  AT  your highest level of activity (not after you stop)   to be sure it stays over 90% and adjust  02 flow upward to maintain this level if needed but remember to turn it back to previous settings when you stop (to conserve your supply).   Plan A = Automatic = Always=    spiriva  / symbicort   2 puffs each am and repeat just the symbicort  in pm x 2puffs   Work on inhaler technique:  relax and gently blow all the way out then take a nice smooth full deep breath back in, triggering the inhaler at same time you start breathing in.  Hold breath in for at least  5 seconds if you can. Blow out symbicort   thru nose. Rinse and gargle with water when done.  If mouth or throat bother you at all,  try brushing teeth/gums/tongue with arm and hammer toothpaste/ make a slurry and gargle and spit out.   >>>  Remember how golfers warm up by taking practice swings - do this with an empty inhaler   Plan B = Backup (to supplement plan A, not to replace it) Only use your albuterol  inhaler as a rescue medication to be used if you can't catch your breath by resting or doing a relaxed purse lip breathing pattern.  - The less you use it, the better it will work when you need it. - Ok to use the inhaler up to 2 puffs  every 4 hours if you must but call for appointment if use goes up over your usual need - Don't leave home without it !!  (think of it like the spare tire for your car)   Plan C = Crisis (instead of Plan B but only if Plan B stops working) - only use your albuterol  nebulizer if you first try Plan B and it fails to help > ok to use the nebulizer up to every 4 hours but if start needing it regularly call for immediate appointment  We will order you a new nebulizer   Prednisone  10 mg take  4 each am x 2 days,   2 each am x 2 days,  1 each am x 2 days and stop (refillable)    Please schedule a follow up visit in 3 months but call sooner if needed  with all medications  /inhalers/ solutions in hand so we can verify exactly what you are taking. This includes all medications from all doctors and over the counters    You are cleared for travel by air but you will need to carry with on the plane both your oxygen  tank and nebulizer

## 2023-11-09 NOTE — Progress Notes (Signed)
 Subjective:     Patient ID: Glenn [name], male   DOB: 1960/05/22     MRN: [id number]     Brief patient profile:  65  yobm quit smoking 09/2020   with onset of symptoms in 1995 while in [place] dx as asthma vs sarcoid and rx pred/ theoph / albuterol  seen by allergist dx as pollen moved to [place] where underwent bilateral lung surgery 2011 ? Lung vol reduction ? Helped breathing at the time but uses [facility] parking and  [facility] = can't walk a nl pace on a flat grade s sob but does fine slow and flat so referred to pulmonary clinic 10/05/2016 by Dr  Glenn [name]      History of Present Illness  10/05/2016 1st [facility] office visit/ Glenn [name]   Chief Complaint  Patient presents with   Pulmonary Consult    Referred by Dr. Aliene [name] for eval of Asthma and COPD. Pt states he was dxed with COPD and Asthma in 1995. He moved here a year ago from [place], [place]. He states his breathing is okay today. He states he has a slight cold- prod cough with minimal brown sputum.    not much better since quit smoking = freq noct wheeze/ cough and need for saba and while on maint rx = arnuity  Quite a bit better with last pred/ very poor hfa baseline/ way overusing saba in multiple forms  Doe = MMRC2 = can't walk a nl pace on a flat grade s sob but does fine slow and flat   rec Prednisone  10 mg take  4 each am x 2 days,   2 each am x 2 days,  1 each am x 2 days and stop  Plan A = Automatic = dulera  200 Take 2 puffs first thing in am and then another 2 puffs about 12 hours later.  Work on [employer] B = Backup Only use your albuterol  (proair ) as a rescue medication Plan C = Crisis - only use your albuterol  nebulizer if you first try Plan B and it fails to help > ok to use the nebulizer up to every 4 hours but if start needing it regularly call for immediate appointment Please schedule a follow up office visit in 6 weeks, call sooner if needed with pfts    Admit 09/29/21 and 11/17/21  and tussionex made  the difference   01/18/2022  f/u ov/Glenn [name] re: GOLD 2  maint on symbicort  160  / spiriva  one (rec was 2) puffs each am Chief Complaint  Patient presents with   Follow-up    Pt states he was in hospital for about 3 days since LOV. Pt was discharged with Oxygen  but has not used it.   Dyspnea:  not pushing buggy, riding scooter instead / no longer doing PT Cough: minimal rattling but can be severe and worse at bedtime / nothing purulent now  Sleeping: 30 degrees bed  SABA use: twice daily neb  02: not using  Rec Plan A = Automatic = Always=    symbicort  160 2 first thing in am / chase with spriiva 2.5 x 2 puff and 12 hours later symbicort  160 x 2 puffs Work on inhaler technique Plan B = Backup (to supplement plan A, not to replace it) Only use your albuterol  inhaler as a rescue medication Plan C = Crisis (instead of Plan B but only if Plan B stops working) - only use your albuterol  nebulizer if you first try Plan B For  cough > mucinex  1200 mg every 12 hours and take  oxycodone  up to every 4 hours  or hydrocodone  every 4 hours as needed           11/10/2022  f/u ov/Glenn [name] office/Glenn [name] re: GOLD 2 copd  maint on symb/spiriva   Chief Complaint  Patient presents with   Follow-up    Doing well.  Does have some wheezing and cough in mornings.  Needs new nebulizer machine.   Dyspnea:  back limits activity  /  uses scooter most of the time / does walk at grocery  ok if very slowly  Cough:some wheezing/ congestion in am  Sleeping: 30 degress s    resp cc  SABA use: none  02: not using x after exertion. [facility]  Rec Make sure you check your oxygen  saturation  AT  your highest level of activity (not after you stop)   to be sure it stays over 90%   Plan A = Automatic = Always=    spiriva  / symbicort   2 puffs each am and repeat just the symbicort  in pm  Plan B = Backup (to supplement plan A, not to replace it) Only use your albuterol  inhaler as a rescue medication   Plan C = Crisis (instead of Plan B but only if Plan B stops working) - only use your albuterol  nebulizer if you first try Plan B We can provide you a new [facility]   [facility]   08/26/23 There is some breathing motion which can limit evaluation of small and peripheral emboli. No segmental or larger pulmonary embolism identified at this time.   Extensive bronchial wall thickening with distortion and narrowing, similar to previous. Please correlate with known history. There is also underlying emphysematous lung changes.   Interstitial and some ground-glass changes identified along the lung bases bilaterally. Please correlate with history of treatment for pneumonia and recommend follow-up.   Aortic Atherosclerosis (ICD10-I70.0) and Emphysema (ICD10-J43.9)       11/09/2023  f/u ov/Glenn [name] re: GOLD 2 copd / RHD elevation since 2018    maint on ? did not bring all meds  Chief Complaint  Patient presents with   Medical Management of Chronic Issues    Follow up visit   Dyspnea:  riding a scooter at store now / [facility]c parking  Cough: none  Sleeping: 30 degrees / one pillow s resp cc  SABA use: not clear but doe not use pre-exertion as rec  02: since last admit on 02 with exertion and sometimes at hs   Lung cancer screening :  see CTa above     No obvious day to day or daytime variability or assoc excess/ purulent sputum or mucus plugs or hemoptysis or cp or chest tightness, subjective wheeze or overt sinus or hb symptoms.    Also denies any obvious fluctuation of symptoms with weather or environmental changes or other aggravating or alleviating factors except as outlined above   No unusual exposure hx or h/o childhood pna/ asthma or knowledge of premature birth.  Current Allergies, Complete Past Medical History, Past Surgical History, Family History, and Social History were reviewed in [employer] record.  ROS  The following are not active complaints  unless bolded Hoarseness, sore throat, dysphagia, dental problems, itching, sneezing,  nasal congestion or discharge of excess mucus or purulent secretions, ear ache,   fever, chills, sweats, unintended wt loss or wt gain, classically pleuritic or exertional cp,  orthopnea pnd or arm/hand swelling  or leg swelling, presyncope, palpitations, abdominal pain, anorexia, nausea, vomiting, diarrhea  or change in bowel habits or change in bladder habits, change in stools or change in urine, dysuria, hematuria,  rash, arthralgias, visual complaints, headache, numbness, weakness or ataxia or problems with walking or coordination,  change in mood or  memory.        Current Meds  Medication Sig   albuterol  (VENTOLIN  HFA) 108 (90 Base) MCG/ACT inhaler Inhale 1-2 puffs into the lungs every 6 (six) hours as needed for wheezing or shortness of breath. (Patient taking differently: Inhale 2 puffs into the lungs 4 (four) times daily.)   amLODipine  (NORVASC ) 10 MG tablet Take 1 tablet (10 mg total) by mouth daily. (Patient taking differently: Take 10 mg by mouth See admin instructions. Take 10 mg by mouth in the morning and hold for a Systolic number less than 100)   aspirin  EC 81 MG tablet Take 81 mg by mouth daily. Swallow whole.   budesonide -formoterol  (SYMBICORT ) 160-4.5 MCG/ACT inhaler Inhale 2 puffs into the lungs 2 (two) times daily.   Cholecalciferol (VITAMIN D3) 50 MCG (2000 UT) CHEW Chew 4,000 Units by mouth daily.   dextromethorphan -guaiFENesin  (MUCINEX  DM) 30-600 MG 12hr tablet Take 1 tablet by mouth 2 (two) times daily.   docusate sodium  (COLACE) 100 MG capsule Take 100 mg by mouth 2 (two) times daily as needed for mild constipation.   EPINEPHrine  0.3 mg/0.3 mL IJ SOAJ injection Inject 0.3 mg into the muscle as needed for anaphylaxis.   famotidine  (PEPCID ) 20 MG tablet One after supper (Patient taking differently: Take 20 mg by mouth daily as needed for heartburn or indigestion.)   ferrous sulfate  325 (65  FE) MG tablet Take 325 mg by mouth every Monday, Wednesday, and Friday.   fluticasone  (FLONASE ) 50 MCG/ACT nasal spray Place 2 sprays into both nostrils daily for 10 days. (Patient taking differently: Place 2 sprays into both nostrils daily as needed for allergies or rhinitis (or seasonal allergies).)   guaiFENesin  (MUCINEX ) 600 MG 12 hr tablet Take 600 mg by mouth in the morning.   hydrOXYzine  (ATARAX ) 25 MG tablet Take 25 mg by mouth at bedtime.   ipratropium-albuterol  (DUONEB) 0.5-2.5 (3) MG/3ML SOLN Take 3 mLs by nebulization 3 (three) times daily for 5 days, THEN 3 mLs every 6 (six) hours as needed for up to 5 days. (Patient taking differently: Nebulize 3 ml's and inhale into the lungs 4-5 times a day)   metFORMIN  (GLUCOPHAGE ) 500 MG tablet Take 1 tablet (500 mg total) by mouth 2 (two) times daily with a meal. (Patient taking differently: Take 500 mg by mouth 2 (two) times daily before a meal.)   montelukast  (SINGULAIR ) 10 MG tablet Take 10 mg by mouth at bedtime.   naloxone  (NARCAN ) nasal spray 4 mg/0.1 mL Place 1 spray into the nose as needed (opioid overdose - respiratory distress).   oxyCODONE  (OXYCONTIN ) 10 mg 12 hr tablet Take 10 mg by mouth every 12 (twelve) hours.   Oxycodone  HCl 20 MG TABS Take 20 mg by mouth every 6 (six) hours as needed (pain).   OXYGEN  Inhale 2-3 L/min into the lungs as needed (for shortness of breath when exerted).   pantoprazole  (PROTONIX ) 40 MG tablet Take 1 tablet (40 mg total) by mouth daily.   polyethylene glycol (MIRALAX  / GLYCOLAX ) 17 g packet Take 17 g by mouth daily. (Patient taking differently: Take 17 g by mouth daily as needed for mild constipation.)  pravastatin (PRAVACHOL) 10 MG tablet Take 10 mg by mouth at bedtime.   prazosin  (MINIPRESS ) 1 MG capsule Take 1 mg by mouth at bedtime.   promethazine -dextromethorphan  (PROMETHAZINE -DM) 6.25-15 MG/5ML syrup Take 10 mLs by mouth 2 (two) times daily as needed for cough.   promethazine -dextromethorphan   (PROMETHAZINE -DM) 6.25-15 MG/5ML syrup Take 2.5 mLs by mouth 4 (four) times daily as needed for cough.   senna-docusate (SENOKOT-S) 8.6-50 MG tablet Take 1 tablet by mouth 2 (two) times daily. (Patient taking differently: Take 1 tablet by mouth 2 (two) times daily as needed for mild constipation or moderate constipation.)   sildenafil  (VIAGRA ) 100 MG tablet Take 1 tablet (100 mg total) by mouth daily as needed. (Patient taking differently: Take 100 mg by mouth daily as needed for erectile dysfunction.)   tamsulosin  (FLOMAX ) 0.4 MG CAPS capsule Take 1 capsule (0.4 mg total) by mouth daily.   Tiotropium Bromide  Monohydrate 2.5 MCG/ACT AERS Inhale 2 puffs into the lungs daily.   vitamin C (ASCORBIC ACID ) 250 MG tablet Take 250 mg by mouth daily.             Objective:   Physical Exam   Wts  11/09/2023        143  11/10/2022        143  03/01/2022        151  01/18/2022      148 11/22/2021      146   08/16/2021        149  11/11/2020        147  08/11/2020          144 01/15/2020        140 03/26/2019        144  11/20/2018      144  08/14/2018          144  02/08/2018          143  11/22/17 142 lb (64.4 kg)  10/05/16 131 lb 12.8 oz (59.8 kg)    Vital signs reviewed  11/09/2023  - Note at rest 02 sats  90% on 2lpm  NP   General appearance:    chronically ill w/c bound  elderly bm nad   HEENT :  Oropharynx  clear  Nasal turbinates nl    NECK :  without JVD/Nodes/TM/ nl carotid upstrokes bilaterally   LUNGS: no acc muscle use,  Mod barrel  contour chest wall with bilateral  Distant bs s audible wheeze and  without cough on insp or exp maneuvers and mod  Hyperresonant  to  percussion bilaterally     CV:  RRR  no s3 or murmur or increase in P2, and no edema   ABD:  soft and nontender with pos mid insp Hoover's  in the supine position. No bruits or organomegaly appreciated, bowel sounds nl  MS:   Ext warm without deformities or   obvious joint restrictions , calf tenderness, cyanosis or  clubbing  SKIN: warm and dry without lesions    NEURO:  alert, approp, nl sensorium with  no motor or cerebellar deficits apparent.      Assessment:       Assessment & Plan COPD GOLD  II Quit smoking 05/2020    - Spirometry 10/05/2016  FEV1 1.36 (52%)  Ratio 65 p saba with classic curvature - 10/05/2016  > try dulera  200 2bid   - Allergy  profile 11/22/2017 >  Eos 0.6 /  IgE  25 RAST neg x for  roaches/ neg for mold  - 11/22/2017   continue dulera  200 2bid  - PFT's  02/08/2018  FEV1 0.90 (36 % ) ratio 59   p multiple saba prior to study with DLCO  52 % corrects to 106  % for alv volume   02/08/2018  After extensive coaching inhaler device,  effectiveness =    90% from a baseline of 75% with smi so try spiriva  respimat - 08/14/2018    changed to trelegy but preferred symb/spriva - 11/20/2018     Breztri  > better but not covered by medicaid - 03/26/2019  After extensive coaching inhaler device,  effectiveness =    75% (short Ti) > rechallenge with Breztri  > not covered, resumed symb 160 but only took prn - 01/15/2020 re-started on symb 160/spiriva  2.5 daily  - 08/11/2020  After extensive coaching inhaler device,  effectiveness =    75% > continue symbicort / spiriva  - PFT's  08/11/2020  FEV1 1.34 (54 % ) ratio 0.61  p 10 % improvement from saba p spiriva  prior to study with DLCO  11.94 (52%) corrects to 3.41 (77%)  for alv volume and FV curve classic concavity   - 11/22/2021   continue symb/spiriva  add max gerd rx and f/u in 6 weeks -  11/09/2023  After extensive coaching inhaler device,  effectiveness =    50% (short ti)    Group E in terms of symptoms/risk so  laba/lama/ICS  therefore appropriate rx at this point >>>  symbicort  / spiriva    and more approp SABA prn.   >>> since having trouble using devices may benefit from neb laba/lama/ics if can get them thru the [place]   >>> ok for air travel as [place] as he has access to neb and 02 both in the air and on Arrival  >>> added prednisone  6 day tapers x 2   for planned trip to [place] to use if needed for travel related aecopd  and advised on approp use.   Chronic respiratory failure with hypoxia (HCC) Currently on 2lpm and extremely sedentary   Advised: Goal for 02 sats  = 90% especially with exertion and during air travel sitting  - see avs for instructions unique to this ov          Each maintenance medication was reviewed in detail including emphasizing most importantly the difference between maintenance and prns and under what circumstances the prns are to be triggered using an action plan format where appropriate.  Total time for H and P, chart review, counseling, reviewing hfa/neb/02 / pulse ox  device(s) and generating customized AVS unique to this office visit / same day charting = 35 min          AVS  Patient Instructions  Make sure you check your oxygen  saturation  AT  your highest level of activity (not after you stop)   to be sure it stays over 90% and adjust  02 flow upward to maintain this level if needed but remember to turn it back to previous settings when you stop (to conserve your supply).   Plan A = Automatic = Always=    spiriva  / symbicort   2 puffs each am and repeat just the symbicort  in pm x 2puffs   Work on inhaler technique:  relax and gently blow all the way out then take a nice smooth full deep breath back in, triggering the inhaler at same time you start breathing in.  Hold breath in for at least  5 seconds if you can. Blow out symbicort   thru nose. Rinse and gargle with water when done.  If mouth or throat bother you at all,  try brushing teeth/gums/tongue with arm and hammer toothpaste/ make a slurry and gargle and spit out.   >>>  Remember how golfers warm up by taking practice swings - do this with an empty inhaler   Plan B = Backup (to supplement plan A, not to replace it) Only use your albuterol  inhaler as a rescue medication to be used if you can't catch your breath by resting or doing a relaxed purse  lip breathing pattern.  - The less you use it, the better it will work when you need it. - Ok to use the inhaler up to 2 puffs  every 4 hours if you must but call for appointment if use goes up over your usual need - Don't leave home without it !!  (think of it like the spare tire for your car)   Plan C = Crisis (instead of Plan B but only if Plan B stops working) - only use your albuterol  nebulizer if you first try Plan B and it fails to help > ok to use the nebulizer up to every 4 hours but if start needing it regularly call for immediate appointment  We will order you a new nebulizer   Prednisone  10 mg take  4 each am x 2 days,   2 each am x 2 days,  1 each am x 2 days and stop (refillable)    Please schedule a follow up visit in 3 months but call sooner if needed  with all medications /inhalers/ solutions in hand so we can verify exactly what you are taking. This includes all medications from all doctors and over the counters    You are cleared for travel by air but you will need to carry with on the plane both your oxygen  tank and nebulizer       Ozell America, MD 11/11/2023

## 2023-11-10 ENCOUNTER — Ambulatory Visit: Payer: Self-pay

## 2023-11-10 NOTE — Telephone Encounter (Signed)
 FYI Only or Action Required?: Action required by provider: clinical question for provider.  Patient is followed in Pulmonology for COPD, last seen on 11/09/2023 by Darlean Ozell NOVAK, MD.  Called Nurse Triage reporting Medication Problem.  Triage Disposition: Call PCP Now  Patient/caregiver understands and will follow disposition?: Yes     Copied from CRM [id number]. Topic: Clinical - Prescription Issue >> Nov 10, 2023  8:40 AM Nathanel DEL wrote: Reason for CRM: [facility] b/c he needs clarification on the directions for the pt's predniSONE  (DELTASONE ) 10 MG tablet   Please call Selinda at [phone] Ext [phone] >> Nov 10, 2023 10:23 AM Joesph PARAS wrote: Pharmacist calling back requesting to speak to clinical staff right now to get this clarified.         Reason for Disposition  [1] Pharmacy calling with prescription question AND [2] triager unable to answer question  Answer Assessment - Initial Assessment Questions 1. NAME of MEDICINE: What medicine(s) are you calling about?     Prednisone   2. QUESTION: What is your question? (e.g., double dose of medicine, side effect)     Pharmacy calling for clarification of the patient's Prednisone  prescription, stating that the dispense quantity is 28 which does not match up with the prescription instructions. He would like a call back at the number below for clarification so he can fill the prescription for the patient.   Please call Selinda at [phone] Ext [phone]  Protocols used: Medication Question Call-A-AH

## 2023-11-11 DIAGNOSIS — J9611 Chronic respiratory failure with hypoxia: Secondary | ICD-10-CM | POA: Insufficient documentation

## 2023-11-11 NOTE — Assessment & Plan Note (Addendum)
 Quit smoking 05/2020    - Spirometry 10/05/2016  FEV1 1.36 (52%)  Ratio 65 p saba with classic curvature - 10/05/2016  > try dulera  200 2bid   - Allergy  profile 11/22/2017 >  Eos 0.6 /  IgE  25 RAST neg x for roaches/ neg for mold  - 11/22/2017   continue dulera  200 2bid  - PFT's  02/08/2018  FEV1 0.90 (36 % ) ratio 59   p multiple saba prior to study with DLCO  52 % corrects to 106  % for alv volume   02/08/2018  After extensive coaching inhaler device,  effectiveness =    90% from a baseline of 75% with smi so try spiriva  respimat - 08/14/2018    changed to trelegy but preferred symb/spriva - 11/20/2018     Breztri  > better but not covered by medicaid - 03/26/2019  After extensive coaching inhaler device,  effectiveness =    75% (short Ti) > rechallenge with Breztri  > not covered, resumed symb 160 but only took prn - 01/15/2020 re-started on symb 160/spiriva  2.5 daily  - 08/11/2020  After extensive coaching inhaler device,  effectiveness =    75% > continue symbicort / spiriva  - PFT's  08/11/2020  FEV1 1.34 (54 % ) ratio 0.61  p 10 % improvement from saba p spiriva  prior to study with DLCO  11.94 (52%) corrects to 3.41 (77%)  for alv volume and FV curve classic concavity   - 11/22/2021   continue symb/spiriva  add max gerd rx and f/u in 6 weeks -  11/09/2023  After extensive coaching inhaler device,  effectiveness =    50% (short ti)    Group E in terms of symptoms/risk so  laba/lama/ICS  therefore appropriate rx at this point >>>  symbicort  / spiriva    and more approp SABA prn.   >>> since having trouble using devices may benefit from neb laba/lama/ics if can get them thru the [place]   >>> ok for air travel as [place] as he has access to neb and 02 both in the air and on Arrival  >>> added prednisone  6 day tapers x 2  for planned trip to [place] to use if needed for travel related aecopd  and advised on approp use.

## 2023-11-11 NOTE — Assessment & Plan Note (Addendum)
 Currently on 2lpm and extremely sedentary   Advised: Goal for 02 sats  = 90% especially with exertion and during air travel sitting  - see avs for instructions unique to this ov          Each maintenance medication was reviewed in detail including emphasizing most importantly the difference between maintenance and prns and under what circumstances the prns are to be triggered using an action plan format where appropriate.  Total time for H and P, chart review, counseling, reviewing hfa/neb/02 / pulse ox  device(s) and generating customized AVS unique to this office visit / same day charting = 35 min

## 2023-11-14 NOTE — Telephone Encounter (Signed)
 I called the [facility] pharmacy and spoke with Rock.  She verified that the order had been clarified and the patient had picked up the prednisone .  Nothing further needed.

## 2023-12-06 ENCOUNTER — Other Ambulatory Visit (HOSPITAL_COMMUNITY): Payer: Self-pay

## 2023-12-11 ENCOUNTER — Emergency Department (HOSPITAL_BASED_OUTPATIENT_CLINIC_OR_DEPARTMENT_OTHER)
Admission: EM | Admit: 2023-12-11 | Discharge: 2023-12-11 | Disposition: A | Discharge status: Home / Self Care | Attending: Emergency Medicine | Admitting: Emergency Medicine

## 2023-12-11 ENCOUNTER — Other Ambulatory Visit: Payer: Self-pay

## 2023-12-11 ENCOUNTER — Emergency Department (HOSPITAL_BASED_OUTPATIENT_CLINIC_OR_DEPARTMENT_OTHER): Admitting: Radiology

## 2023-12-11 ENCOUNTER — Encounter (HOSPITAL_BASED_OUTPATIENT_CLINIC_OR_DEPARTMENT_OTHER): Payer: Self-pay

## 2023-12-11 DIAGNOSIS — J209 Acute bronchitis, unspecified: Secondary | ICD-10-CM | POA: Diagnosis not present

## 2023-12-11 DIAGNOSIS — Z7982 Long term (current) use of aspirin: Secondary | ICD-10-CM | POA: Diagnosis not present

## 2023-12-11 DIAGNOSIS — Z7952 Long term (current) use of systemic steroids: Secondary | ICD-10-CM | POA: Diagnosis not present

## 2023-12-11 DIAGNOSIS — R0602 Shortness of breath: Secondary | ICD-10-CM | POA: Diagnosis present

## 2023-12-11 DIAGNOSIS — J441 Chronic obstructive pulmonary disease with (acute) exacerbation: Secondary | ICD-10-CM | POA: Diagnosis not present

## 2023-12-11 DIAGNOSIS — J45909 Unspecified asthma, uncomplicated: Secondary | ICD-10-CM | POA: Insufficient documentation

## 2023-12-11 DIAGNOSIS — R059 Cough, unspecified: Secondary | ICD-10-CM | POA: Insufficient documentation

## 2023-12-11 DIAGNOSIS — Z7951 Long term (current) use of inhaled steroids: Secondary | ICD-10-CM | POA: Diagnosis not present

## 2023-12-11 LAB — CBC
HCT: 41.4 % (ref 39.0–52.0)
Hemoglobin: 12.6 g/dL — ABNORMAL LOW (ref 13.0–17.0)
MCH: 25.3 pg — ABNORMAL LOW (ref 26.0–34.0)
MCHC: 30.4 g/dL (ref 30.0–36.0)
MCV: 83 fL (ref 80.0–100.0)
Platelets: 368 K/uL (ref 150–400)
RBC: 4.99 MIL/uL (ref 4.22–5.81)
RDW: 16.8 % — ABNORMAL HIGH (ref 11.5–15.5)
WBC: 8.1 K/uL (ref 4.0–10.5)
nRBC: 0 % (ref 0.0–0.2)

## 2023-12-11 LAB — RESP PANEL BY RT-PCR (RSV, FLU A&B, COVID)  RVPGX2
Influenza A by PCR: NEGATIVE
Influenza B by PCR: NEGATIVE
Resp Syncytial Virus by PCR: NEGATIVE
SARS Coronavirus 2 by RT PCR: NEGATIVE

## 2023-12-11 LAB — BASIC METABOLIC PANEL WITH GFR
Anion gap: 7 (ref 5–15)
BUN: 11 mg/dL (ref 8–23)
CO2: 32 mmol/L (ref 22–32)
Calcium: 10.1 mg/dL (ref 8.9–10.3)
Chloride: 98 mmol/L (ref 98–111)
Creatinine, Ser: 0.84 mg/dL (ref 0.61–1.24)
GFR, Estimated: >60 mL/min (ref >60)
Glucose, Bld: 188 mg/dL — ABNORMAL HIGH (ref 70–99)
Potassium: 4.6 mmol/L (ref 3.5–5.1)
Sodium: 137 mmol/L (ref 135–145)

## 2023-12-11 LAB — TROPONIN T, HIGH SENSITIVITY: Troponin T High Sensitivity: <15 ng/L (ref 0–19)

## 2023-12-11 LAB — D-DIMER, QUANTITATIVE: D-Dimer, Quant: 0.52 ug{FEU}/mL — ABNORMAL HIGH (ref 0.00–0.50)

## 2023-12-11 MED ORDER — DOXYCYCLINE HYCLATE 100 MG PO CAPS: 100.0000 mg | ORAL_CAPSULE | Freq: Two times a day (BID) | ORAL | 0 refills | Status: DC

## 2023-12-11 MED ORDER — METHYLPREDNISOLONE SODIUM SUCC 125 MG IJ SOLR
125.0000 mg | Freq: Once | INTRAMUSCULAR | Status: AC
  Administered 2023-12-11: 125 mg via INTRAVENOUS
  Filled 2023-12-11: qty 2

## 2023-12-11 MED ORDER — PREDNISONE 50 MG PO TABS: ORAL_TABLET | ORAL | 0 refills | Status: DC

## 2023-12-11 MED ORDER — PROMETHAZINE-DM 6.25-15 MG/5ML PO SYRP: 5.0000 mL | ORAL_SOLUTION | Freq: Four times a day (QID) | ORAL | 0 refills | Status: DC | PRN

## 2023-12-11 NOTE — ED Provider Notes (Signed)
 [place] EMERGENCY DEPARTMENT AT [facility] Provider Note   CSN: [id number] Arrival date & time: 12/11/23  1150     Patient presents with: Shortness of Breath   Glenn [name] is a 63 y.o. male.   Patient complains of increased shortness of breath today.  Patient reports he had a coughing episode and felt short of breath.  Patient has a past medical history of asthma and COPD.  Patient is on oxygen  2 L at home.  Patient states he has been doing his neb treatments.  Patient reports the episode has currently resolved.  He is feeling much better.  Patient is requesting a prescription for promethazine  and cough syrup he states this helps him more than the breathing treatments.  Patient states when he has had similar episodes in the past he has been treated with prednisone  and has done well.  Patient denies any nausea or vomiting.  Patient denies any chest pain.  Patient states he has not had any fever or chills.  [facility]  The history is provided by the patient. No language interpreter was used.  Shortness of Breath      Prior to Admission medications   Medication Sig Start Date End Date Taking? Authorizing Provider  doxycycline  (VIBRAMYCIN ) 100 MG capsule Take 1 capsule (100 mg total) by mouth 2 (two) times daily. 12/11/23  Yes Dianah Pruett K, PA-C  predniSONE  (DELTASONE ) 50 MG tablet One tablet a day for 5 days 12/11/23  Yes Flint Sonny POUR, PA-C  albuterol  (VENTOLIN  HFA) 108 (90 Base) MCG/ACT inhaler Inhale 1-2 puffs into the lungs every 6 (six) hours as needed for wheezing or shortness of breath. Patient taking differently: Inhale 2 puffs into the lungs 4 (four) times daily. 08/29/23   Sebastian Toribio GAILS, MD  amLODipine  (NORVASC ) 10 MG tablet Take 1 tablet (10 mg total) by mouth daily. Patient taking differently: Take 10 mg by mouth See admin instructions. Take 10 mg by mouth in the morning and hold for a Systolic number less than 100 1/72/76   Rai, Ripudeep  K, MD  aspirin  EC 81 MG tablet Take 81 mg by mouth daily. Swallow whole.    [provider]  budesonide -formoterol  (SYMBICORT ) 160-4.5 MCG/ACT inhaler Inhale 2 puffs into the lungs 2 (two) times daily. 08/29/23   Sebastian Toribio GAILS, MD  Cholecalciferol (VITAMIN D3) 50 MCG (2000 UT) CHEW Chew 4,000 Units by mouth daily.    [provider]  clopidogrel  (PLAVIX ) 75 MG tablet Take 1 tablet (75 mg total) by mouth daily. Patient not taking: Reported on 11/09/2023 08/30/23   Sebastian Toribio GAILS, MD  dextromethorphan -guaiFENesin  (MUCINEX  DM) 30-600 MG 12hr tablet Take 1 tablet by mouth 2 (two) times daily. 10/26/23   Raenelle Coria, MD  docusate sodium  (COLACE) 100 MG capsule Take 100 mg by mouth 2 (two) times daily as needed for mild constipation.    [provider]  EPINEPHrine  0.3 mg/0.3 mL IJ SOAJ injection Inject 0.3 mg into the muscle as needed for anaphylaxis. 08/08/23   [provider]  famotidine  (PEPCID ) 20 MG tablet One after supper Patient taking differently: Take 20 mg by mouth daily as needed for heartburn or indigestion. 11/22/21   Darlean Ozell NOVAK, MD  ferrous sulfate  325 (65 FE) MG tablet Take 325 mg by mouth every Monday, Wednesday, and Friday.    [provider]  fluticasone  (FLONASE ) 50 MCG/ACT nasal spray Place 2 sprays into both nostrils daily for 10 days. Patient  taking differently: Place 2 sprays into both nostrils daily as needed for allergies or rhinitis (or seasonal allergies). 08/30/23   Sebastian Toribio GAILS, MD  guaiFENesin  (MUCINEX ) 600 MG 12 hr tablet Take 600 mg by mouth in the morning.    [provider]  hydrOXYzine  (ATARAX ) 25 MG tablet Take 25 mg by mouth at bedtime. 08/08/23   [provider]  ipratropium-albuterol  (DUONEB) 0.5-2.5 (3) MG/3ML SOLN Take 3 mLs by nebulization 3 (three) times daily for 5 days, THEN 3 mLs every 6 (six) hours as needed for up to 5 days. Patient taking differently: Nebulize 3 ml's and inhale  into the lungs 4-5 times a day 08/29/23 11/09/23  Sebastian Toribio GAILS, MD  loratadine  (CLARITIN ) 10 MG tablet Take 1 tablet (10 mg total) by mouth daily. Patient not taking: Reported on 11/09/2023 01/03/22     metFORMIN  (GLUCOPHAGE ) 500 MG tablet Take 1 tablet (500 mg total) by mouth 2 (two) times daily with a meal. Patient taking differently: Take 500 mg by mouth 2 (two) times daily before a meal. 10/03/21   Rai, Ripudeep K, MD  montelukast  (SINGULAIR ) 10 MG tablet Take 10 mg by mouth at bedtime.    [provider]  naloxone  (NARCAN ) nasal spray 4 mg/0.1 mL Place 1 spray into the nose as needed (opioid overdose - respiratory distress). 08/20/21   [provider]  oxyCODONE  (OXYCONTIN ) 10 mg 12 hr tablet Take 10 mg by mouth every 12 (twelve) hours.    [provider]  Oxycodone  HCl 20 MG TABS Take 20 mg by mouth every 6 (six) hours as needed (pain).    [provider]  OXYGEN  Inhale 2-3 L/min into the lungs as needed (for shortness of breath when exerted).    [provider]  pantoprazole  (PROTONIX ) 40 MG tablet Take 1 tablet (40 mg total) by mouth daily. 07/02/19   Elgergawy, Dawood S, MD  polyethylene glycol (MIRALAX  / GLYCOLAX ) 17 g packet Take 17 g by mouth daily. Patient taking differently: Take 17 g by mouth daily as needed for mild constipation. 08/30/23   Sebastian Toribio GAILS, MD  pravastatin (PRAVACHOL) 10 MG tablet Take 10 mg by mouth at bedtime.    [provider]  prazosin  (MINIPRESS ) 1 MG capsule Take 1 mg by mouth at bedtime.    [provider]  promethazine -dextromethorphan  (PROMETHAZINE -DM) 6.25-15 MG/5ML syrup Take 5 mLs by mouth 4 (four) times daily as needed for cough. 12/11/23   Kimm Sider K, PA-C  senna-docusate (SENOKOT-S) 8.6-50 MG tablet Take 1 tablet by mouth 2 (two) times daily. Patient taking differently: Take 1 tablet by mouth 2 (two) times daily as needed for mild constipation or moderate constipation. 08/29/23    Sebastian Toribio GAILS, MD  sildenafil  (VIAGRA ) 100 MG tablet Take 1 tablet (100 mg total) by mouth daily as needed. Patient taking differently: Take 100 mg by mouth daily as needed for erectile dysfunction. 09/28/23     tamsulosin  (FLOMAX ) 0.4 MG CAPS capsule Take 1 capsule (0.4 mg total) by mouth daily. 08/29/23   Sebastian Toribio GAILS, MD  Tiotropium Bromide  Monohydrate 2.5 MCG/ACT AERS Inhale 2 puffs into the lungs daily.    [provider]  vitamin C (ASCORBIC ACID ) 250 MG tablet Take 250 mg by mouth daily.    [provider]    Allergies: Insulins, Porcine (pork) protein-containing drug products, Acetaminophen , Ibuprofen, Lisinopril, Shrimp [shellfish allergy ], Insulin  aspart, and Amoxicillin -pot clavulanate    Review of Systems  Respiratory:  Positive for  shortness of breath.   All other systems reviewed and are negative.   Updated Vital Signs BP (!) 135/104   Pulse 100   Temp 98.8 F (37.1 C)   Resp 19   Ht 5' 5 (1.651 m)   Wt 65.8 kg   SpO2 96%   BMI 24.13 kg/m   Physical Exam Vitals and nursing note reviewed.  Constitutional:      Appearance: He is well-developed.  HENT:     Head: Normocephalic.  Cardiovascular:     Rate and Rhythm: Normal rate and regular rhythm.  Pulmonary:     Effort: Pulmonary effort is normal.     Breath sounds: Decreased breath sounds present.  Chest:     Chest wall: No mass.  Abdominal:     General: There is no distension.  Musculoskeletal:        General: Normal range of motion.     Cervical back: Normal range of motion.  Skin:    General: Skin is warm.  Neurological:     General: No focal deficit present.     Mental Status: He is alert and oriented to person, place, and time.  Psychiatric:        Mood and Affect: Mood normal.     (all labs ordered are listed, but only abnormal results are displayed) Labs Reviewed  BASIC METABOLIC PANEL WITH GFR - Abnormal; Notable for the following components:      Result Value    Glucose, Bld 188 (*)    All other components within normal limits  CBC - Abnormal; Notable for the following components:   Hemoglobin 12.6 (*)    MCH 25.3 (*)    RDW 16.8 (*)    All other components within normal limits  D-DIMER, QUANTITATIVE - Abnormal; Notable for the following components:   D-Dimer, Quant 0.52 (*)    All other components within normal limits  RESP PANEL BY RT-PCR (RSV, FLU A&B, COVID)  [id number]  TROPONIN T, HIGH SENSITIVITY  TROPONIN T, HIGH SENSITIVITY    EKG: EKG Interpretation Date/Time:  Monday December 11 2023 12:01:34 EST Ventricular Rate:  120 PR Interval:  134 QRS Duration:  71 QT Interval:  320 QTC Calculation: 453 R Axis:   38  Text Interpretation: Sinus tachycardia Probable left atrial enlargement Abnormal R-wave progression, early transition No significant change since last tracing Confirmed by Zackowski, Scott [phone]) on 12/11/2023 12:13:29 PM  Radiology: [facility] Chest 2 View Result Date: 12/11/2023 EXAM: 2 VIEW(S) XRAY OF THE CHEST 12/11/2023 12:41:00 PM COMPARISON: 10/24/2023 CLINICAL HISTORY: shortness of breath FINDINGS: LINES, TUBES AND DEVICES: Multiple wires and leads project over the chest on the frontal radiograph. Left perihilar surgical clips. LUNGS AND PLEURA: Moderate left hemidiaphragm elevation. Moderate diffuse interstitial thickening. Mild volume loss in the left lung base. SABRA No pleural effusion. No pneumothorax. HEART AND MEDIASTINUM: No acute abnormality of the cardiac and mediastinal silhouettes. BONES AND SOFT TISSUES: No acute osseous abnormality. IMPRESSION: 1. Similar left hemidiaphragm elevation with diffuse interstitial thickening. Given the clinical history, most likely related to COPD/chronic bronchitis; no superimposed acute or subacute process. Electronically signed by: Rockey Kilts MD 12/11/2023 01:57 PM EST RP Workstation: [id number]     Procedures   Medications Ordered in the ED  methylPREDNISolone  sodium succinate  (SOLU-MEDROL ) 125 mg/2 mL injection 125 mg (125 mg Intravenous Given 12/11/23 1410)  Medical Decision Making Pt complains of shortness of breath. Pt has a history of asthma and COPD  Amount and/or Complexity of Data Reviewed External Data Reviewed: notes.    Details: Pulmonology notes reviewed Labs: ordered.    Details: Labs ordered reviewed and interpreted D-dimer is 0.52.  This is age-adjusted normal.  Troponin is negative.  Patient has a normal white blood cell count Radiology: ordered and independent interpretation performed. Decision-making details documented in ED Course.    Details: Chest x-ray shows diffuse interstitial thickening similar to previous chest x-ray  Risk Prescription drug management. Risk Details: Patient is given IV Solu-Medrol .  Patient is observed.  Patient continues to feel better.  He reports that he is at his baseline as far as breathing.  Patient's oxygen  sat is 96 6% on 2 L.  He tells me this is normal for him.  Patient is counseled on treatment options.  He is given doxycycline  and prednisone .  Patient is also given a prescription for promethazine  cough syrup.  Patient is advised to follow-up with the [facility] for recheck.  Return if any problems        Final diagnoses:  Acute bronchitis, unspecified organism  COPD exacerbation [facility])    ED Discharge Orders          Ordered    doxycycline  (VIBRAMYCIN ) 100 MG capsule  2 times daily        12/11/23 1636    predniSONE  (DELTASONE ) 50 MG tablet        12/11/23 1637    promethazine -dextromethorphan  (PROMETHAZINE -DM) 6.25-15 MG/5ML syrup  4 times daily PRN        12/11/23 1640            An After Visit Summary was printed and given to the patient.    Fl[place] Sonny POUR, PA-C 12/11/23 1845    Zackowski, Scott, MD 12/12/23 1236

## 2023-12-11 NOTE — ED Triage Notes (Signed)
 Patient with hx of COPD comes in with shortness of breath after his vacation to [place]. He has been using his nebulizer and his 2L of O2 more frequently. His sats are 77% in triage and he is very winded and purse lip breathing. RT made aware.

## 2024-01-10 ENCOUNTER — Encounter (HOSPITAL_BASED_OUTPATIENT_CLINIC_OR_DEPARTMENT_OTHER): Payer: Self-pay

## 2024-01-10 ENCOUNTER — Observation Stay (HOSPITAL_BASED_OUTPATIENT_CLINIC_OR_DEPARTMENT_OTHER)
Admission: EM | Admit: 2024-01-10 | Discharge: 2024-01-14 | DRG: 189 | Disposition: A | Attending: Internal Medicine | Admitting: Internal Medicine

## 2024-01-10 ENCOUNTER — Emergency Department (HOSPITAL_BASED_OUTPATIENT_CLINIC_OR_DEPARTMENT_OTHER)

## 2024-01-10 DIAGNOSIS — J9622 Acute and chronic respiratory failure with hypercapnia: Secondary | ICD-10-CM

## 2024-01-10 DIAGNOSIS — R0602 Shortness of breath: Principal | ICD-10-CM

## 2024-01-10 DIAGNOSIS — N400 Enlarged prostate without lower urinary tract symptoms: Secondary | ICD-10-CM | POA: Diagnosis present

## 2024-01-10 DIAGNOSIS — Z8673 Personal history of transient ischemic attack (TIA), and cerebral infarction without residual deficits: Secondary | ICD-10-CM

## 2024-01-10 DIAGNOSIS — J441 Chronic obstructive pulmonary disease with (acute) exacerbation: Principal | ICD-10-CM | POA: Diagnosis present

## 2024-01-10 DIAGNOSIS — E1169 Type 2 diabetes mellitus with other specified complication: Secondary | ICD-10-CM | POA: Diagnosis present

## 2024-01-10 DIAGNOSIS — E119 Type 2 diabetes mellitus without complications: Secondary | ICD-10-CM

## 2024-01-10 DIAGNOSIS — E872 Acidosis, unspecified: Secondary | ICD-10-CM | POA: Diagnosis present

## 2024-01-10 DIAGNOSIS — I2510 Atherosclerotic heart disease of native coronary artery without angina pectoris: Secondary | ICD-10-CM

## 2024-01-10 DIAGNOSIS — G8929 Other chronic pain: Secondary | ICD-10-CM | POA: Diagnosis present

## 2024-01-10 DIAGNOSIS — E785 Hyperlipidemia, unspecified: Secondary | ICD-10-CM | POA: Diagnosis present

## 2024-01-10 DIAGNOSIS — J9611 Chronic respiratory failure with hypoxia: Secondary | ICD-10-CM | POA: Diagnosis present

## 2024-01-10 DIAGNOSIS — I10 Essential (primary) hypertension: Secondary | ICD-10-CM | POA: Diagnosis present

## 2024-01-10 DIAGNOSIS — G8921 Chronic pain due to trauma: Secondary | ICD-10-CM

## 2024-01-10 DIAGNOSIS — R079 Chest pain, unspecified: Secondary | ICD-10-CM

## 2024-01-10 DIAGNOSIS — J9621 Acute and chronic respiratory failure with hypoxia: Secondary | ICD-10-CM | POA: Diagnosis present

## 2024-01-10 LAB — CBC WITH DIFFERENTIAL/PLATELET
Abs Immature Granulocytes: 0.03 K/uL (ref 0.00–0.07)
Basophils Absolute: 0.1 K/uL (ref 0.0–0.1)
Basophils Relative: 1 %
Eosinophils Absolute: 0.5 K/uL (ref 0.0–0.5)
Eosinophils Relative: 4 %
HCT: 41.8 % (ref 39.0–52.0)
Hemoglobin: 12.7 g/dL — ABNORMAL LOW (ref 13.0–17.0)
Immature Granulocytes: 0 %
Lymphocytes Relative: 20 %
Lymphs Abs: 2.3 K/uL (ref 0.7–4.0)
MCH: 25.5 pg — ABNORMAL LOW (ref 26.0–34.0)
MCHC: 30.4 g/dL (ref 30.0–36.0)
MCV: 83.9 fL (ref 80.0–100.0)
Monocytes Absolute: 0.8 K/uL (ref 0.1–1.0)
Monocytes Relative: 7 %
Neutro Abs: 7.9 K/uL — ABNORMAL HIGH (ref 1.7–7.7)
Neutrophils Relative %: 68 %
Platelets: 327 K/uL (ref 150–400)
RBC: 4.98 MIL/uL (ref 4.22–5.81)
RDW: 17.7 % — ABNORMAL HIGH (ref 11.5–15.5)
WBC: 11.6 K/uL — ABNORMAL HIGH (ref 4.0–10.5)
nRBC: 0 % (ref 0.0–0.2)

## 2024-01-10 LAB — BLOOD GAS, VENOUS
Acid-Base Excess: 4.8 mmol/L — ABNORMAL HIGH (ref 0.0–2.0)
Bicarbonate: 33.2 mmol/L — ABNORMAL HIGH (ref 20.0–28.0)
O2 Saturation: 100 %
Patient temperature: 37
pCO2, Ven: 66 mmHg — ABNORMAL HIGH (ref 44–60)
pH, Ven: 7.31 (ref 7.25–7.43)
pO2, Ven: 117 mmHg — ABNORMAL HIGH (ref 32–45)

## 2024-01-10 LAB — TROPONIN T, HIGH SENSITIVITY
Troponin T High Sensitivity: <15 ng/L (ref 0–19)
Troponin T High Sensitivity: <15 ng/L (ref 0–19)

## 2024-01-10 LAB — I-STAT VENOUS BLOOD GAS, ED
Acid-Base Excess: 6 mmol/L — ABNORMAL HIGH (ref 0.0–2.0)
Bicarbonate: 35.3 mmol/L — ABNORMAL HIGH (ref 20.0–28.0)
Calcium, Ion: 1.22 mmol/L (ref 1.15–1.40)
HCT: 45 % (ref 39.0–52.0)
Hemoglobin: 15.3 g/dL (ref 13.0–17.0)
O2 Saturation: 48 %
Potassium: 3.9 mmol/L (ref 3.5–5.1)
Sodium: 138 mmol/L (ref 135–145)
TCO2: 38 mmol/L — ABNORMAL HIGH (ref 22–32)
pCO2, Ven: 72.1 mmHg (ref 44–60)
pH, Ven: 7.299 (ref 7.25–7.43)
pO2, Ven: 30 mmHg — CL (ref 32–45)

## 2024-01-10 LAB — COMPREHENSIVE METABOLIC PANEL WITH GFR
ALT: 13 U/L (ref 0–44)
AST: 21 U/L (ref 15–41)
Albumin: 4 g/dL (ref 3.5–5.0)
Alkaline Phosphatase: 97 U/L (ref 38–126)
Anion gap: 8 (ref 5–15)
BUN: 11 mg/dL (ref 8–23)
CO2: 33 mmol/L — ABNORMAL HIGH (ref 22–32)
Calcium: 9.8 mg/dL (ref 8.9–10.3)
Chloride: 98 mmol/L (ref 98–111)
Creatinine, Ser: 0.66 mg/dL (ref 0.61–1.24)
GFR, Estimated: >60 mL/min (ref >60)
Glucose, Bld: 90 mg/dL (ref 70–99)
Potassium: 4.1 mmol/L (ref 3.5–5.1)
Sodium: 139 mmol/L (ref 135–145)
Total Bilirubin: 0.3 mg/dL (ref 0.0–1.2)
Total Protein: 7.7 g/dL (ref 6.5–8.1)

## 2024-01-10 LAB — LACTIC ACID, PLASMA: Lactic Acid, Venous: 2.8 mmol/L (ref 0.5–1.9)

## 2024-01-10 LAB — MAGNESIUM: Magnesium: 2.1 mg/dL (ref 1.7–2.4)

## 2024-01-10 LAB — PRO BRAIN NATRIURETIC PEPTIDE: Pro Brain Natriuretic Peptide: 328 pg/mL — ABNORMAL HIGH (ref <300.0)

## 2024-01-10 MED ORDER — METHYLPREDNISOLONE SODIUM SUCC 125 MG IJ SOLR
80.0000 mg | INTRAMUSCULAR | Status: AC
  Administered 2024-01-11: 80 mg via INTRAVENOUS
  Filled 2024-01-10: qty 2

## 2024-01-10 MED ORDER — PRAVASTATIN SODIUM 10 MG PO TABS
10.0000 mg | ORAL_TABLET | Freq: Every day | ORAL | Status: DC
  Administered 2024-01-11 – 2024-01-13 (×3): 10 mg via ORAL
  Filled 2024-01-10 (×5): qty 1

## 2024-01-10 MED ORDER — TAMSULOSIN HCL 0.4 MG PO CAPS
0.4000 mg | ORAL_CAPSULE | Freq: Every day | ORAL | Status: DC
  Administered 2024-01-11 – 2024-01-14 (×4): 0.4 mg via ORAL
  Filled 2024-01-10 (×4): qty 1

## 2024-01-10 MED ORDER — LORAZEPAM 1 MG PO TABS
0.5000 mg | ORAL_TABLET | Freq: Once | ORAL | Status: AC
  Administered 2024-01-10: 0.5 mg via ORAL
  Filled 2024-01-10: qty 1

## 2024-01-10 MED ORDER — PANTOPRAZOLE SODIUM 40 MG PO TBEC
40.0000 mg | DELAYED_RELEASE_TABLET | Freq: Every day | ORAL | Status: DC
  Administered 2024-01-11 – 2024-01-14 (×4): 40 mg via ORAL
  Filled 2024-01-10 (×4): qty 1

## 2024-01-10 MED ORDER — METHYLPREDNISOLONE SODIUM SUCC 125 MG IJ SOLR
125.0000 mg | Freq: Once | INTRAMUSCULAR | Status: AC
  Administered 2024-01-10: 125 mg via INTRAVENOUS
  Filled 2024-01-10: qty 2

## 2024-01-10 MED ORDER — MAGNESIUM SULFATE 2 GM/50ML IV SOLN
2.0000 g | Freq: Once | INTRAVENOUS | Status: AC
  Administered 2024-01-10: 2 g via INTRAVENOUS
  Filled 2024-01-10: qty 50

## 2024-01-10 MED ORDER — ASPIRIN 81 MG PO TBEC
81.0000 mg | DELAYED_RELEASE_TABLET | Freq: Every day | ORAL | Status: DC
  Administered 2024-01-11 – 2024-01-14 (×4): 81 mg via ORAL
  Filled 2024-01-10 (×4): qty 1

## 2024-01-10 MED ORDER — SENNOSIDES-DOCUSATE SODIUM 8.6-50 MG PO TABS: 1.0000 | ORAL_TABLET | Freq: Two times a day (BID) | ORAL | Status: DC | PRN

## 2024-01-10 MED ORDER — SODIUM CHLORIDE 0.9 % IV SOLN: INTRAVENOUS | Status: AC

## 2024-01-10 MED ORDER — IPRATROPIUM-ALBUTEROL 0.5-2.5 (3) MG/3ML IN SOLN
3.0000 mL | Freq: Four times a day (QID) | RESPIRATORY_TRACT | Status: DC
  Administered 2024-01-11 – 2024-01-13 (×8): 3 mL via RESPIRATORY_TRACT
  Filled 2024-01-10 (×9): qty 3

## 2024-01-10 MED ORDER — AZITHROMYCIN 500 MG PO TABS
500.0000 mg | ORAL_TABLET | Freq: Every day | ORAL | Status: DC
  Administered 2024-01-11 – 2024-01-13 (×3): 500 mg via ORAL
  Filled 2024-01-10 (×4): qty 1

## 2024-01-10 MED ORDER — IPRATROPIUM-ALBUTEROL 0.5-2.5 (3) MG/3ML IN SOLN: 6.0000 mL | Freq: Once | RESPIRATORY_TRACT | Status: AC

## 2024-01-10 MED ORDER — OXYCODONE HCL ER 10 MG PO T12A
10.0000 mg | EXTENDED_RELEASE_TABLET | Freq: Two times a day (BID) | ORAL | Status: DC
  Administered 2024-01-11 – 2024-01-14 (×7): 10 mg via ORAL
  Filled 2024-01-10 (×7): qty 1

## 2024-01-10 MED ORDER — MONTELUKAST SODIUM 10 MG PO TABS
10.0000 mg | ORAL_TABLET | Freq: Every day | ORAL | Status: DC
  Administered 2024-01-11 – 2024-01-13 (×4): 10 mg via ORAL
  Filled 2024-01-10 (×4): qty 1

## 2024-01-10 MED ORDER — ALBUTEROL (5 MG/ML) CONTINUOUS INHALATION SOLN
10.0000 mg/h | INHALATION_SOLUTION | Freq: Once | RESPIRATORY_TRACT | Status: AC
  Administered 2024-01-10: 10 mg/h via RESPIRATORY_TRACT
  Filled 2024-01-10: qty 20

## 2024-01-10 MED ORDER — ONDANSETRON HCL 4 MG/2ML IJ SOLN: 4.0000 mg | Freq: Four times a day (QID) | INTRAMUSCULAR | Status: DC | PRN

## 2024-01-10 MED ORDER — IOHEXOL 350 MG/ML SOLN
100.0000 mL | Freq: Once | INTRAVENOUS | Status: AC | PRN
  Administered 2024-01-10: 75 mL via INTRAVENOUS

## 2024-01-10 MED ORDER — SODIUM CHLORIDE 0.9% FLUSH
3.0000 mL | Freq: Two times a day (BID) | INTRAVENOUS | Status: DC
  Administered 2024-01-10 – 2024-01-14 (×8): 3 mL via INTRAVENOUS

## 2024-01-10 MED ORDER — IPRATROPIUM-ALBUTEROL 0.5-2.5 (3) MG/3ML IN SOLN
RESPIRATORY_TRACT | Status: AC
  Administered 2024-01-10: 6 mL via RESPIRATORY_TRACT
  Filled 2024-01-10: qty 6

## 2024-01-10 MED ORDER — PREDNISONE 20 MG PO TABS
40.0000 mg | ORAL_TABLET | Freq: Every day | ORAL | Status: DC
  Administered 2024-01-12 – 2024-01-14 (×3): 40 mg via ORAL
  Filled 2024-01-10 (×3): qty 2

## 2024-01-10 MED ORDER — SODIUM CHLORIDE 0.9 % IV SOLN
500.0000 mg | INTRAVENOUS | Status: AC
  Administered 2024-01-11: 500 mg via INTRAVENOUS
  Filled 2024-01-10: qty 5

## 2024-01-10 MED ORDER — ONDANSETRON HCL 4 MG PO TABS: 4.0000 mg | ORAL_TABLET | Freq: Four times a day (QID) | ORAL | Status: DC | PRN

## 2024-01-10 MED ORDER — ALBUTEROL SULFATE (2.5 MG/3ML) 0.083% IN NEBU: 2.5000 mg | INHALATION_SOLUTION | RESPIRATORY_TRACT | Status: DC | PRN

## 2024-01-10 MED ORDER — FENTANYL CITRATE (PF) 50 MCG/ML IJ SOSY
12.5000 ug | PREFILLED_SYRINGE | INTRAMUSCULAR | Status: DC | PRN
  Administered 2024-01-11: 50 ug via INTRAVENOUS
  Filled 2024-01-10: qty 1

## 2024-01-10 MED ORDER — BUDESONIDE 0.5 MG/2ML IN SUSP
2.0000 mg | Freq: Two times a day (BID) | RESPIRATORY_TRACT | Status: DC
  Administered 2024-01-10 – 2024-01-11 (×2): 2 mg via RESPIRATORY_TRACT
  Filled 2024-01-10 (×2): qty 8

## 2024-01-10 NOTE — ED Provider Notes (Signed)
 Signout from Dr. Rogelia.  63 year old male COPD on home oxygen  just completed a course of steroids and Doxy here with continued shortness of breath.  Worsened oxygen  requirement.  He is pending CT angio.  Plan is reassessment if admission is indicated Physical Exam  BP (!) 162/100   Pulse (!) 103   Temp 98.2 F (36.8 C) (Oral)   Resp (!) 24   SpO2 95%   Physical Exam  Procedures  Procedures  ED Course / MDM    Medical Decision Making Amount and/or Complexity of Data Reviewed Labs: ordered. Radiology: ordered.  Risk Prescription drug management. Decision regarding hospitalization.   4:55 PM.  CT does not show any PE or other acute findings.  Will reassess and trend to see if admission is required.  5 PM.  Patient states he does not feel any better and feels he needs to be admitted to the hospital.  Have ordered him steroids magnesium  continuous neb and consult hospitalist for admission.  5:20 PM.  Discussed with Dr. Vernon [facility] hospitalist who will put the patient in for an admission bed.   Towana Ozell BROCKS, MD 01/11/24 [phone]

## 2024-01-10 NOTE — Assessment & Plan Note (Signed)
-  -   Will initiate: Steroid taper  -  Antibiotics azythro - Albuterol   PRN, - scheduled duoneb,  -  Breo or Dulera  at discharge   -  Mucinex .  Titrate O2 to saturation >90%. Follow patients respiratory status.  Order nfluenza PCR   VBG  showing hypercarbia compensated but patient does appear somewhat somnolent  trial of bipap overnight Move to progressive

## 2024-01-10 NOTE — Assessment & Plan Note (Signed)
 Continue plavix and aspirin.

## 2024-01-10 NOTE — Assessment & Plan Note (Signed)
 Continue pravachol 10 mg po q day

## 2024-01-10 NOTE — Assessment & Plan Note (Signed)
 In the setting of increased work of breathing

## 2024-01-10 NOTE — Assessment & Plan Note (Signed)
 Due to copd.

## 2024-01-10 NOTE — Assessment & Plan Note (Signed)
 Continue flomax

## 2024-01-10 NOTE — H&P (Signed)
 Glenn [name]:[id number] DOB: 10-18-1960 DOA: 01/10/2024     PCP: [facility]   Outpatient Specialists: Pulmonary  Dr. Darlean Patient arrived to ER on 01/10/24 at 1027 Referred by Attending Silvester Ales, MD   Patient coming from:    home Lives  With family      Chief Complaint:   Chief Complaint  Patient presents with   Shortness of Breath    HPI: Glenn [name] is a 63 y.o. male with medical history significant of  COPD, DM2, HTN, CVA, HLD     Presented with   SOB, chest tightness  History of COPD presents with worsening shortness of breath patient baseline has shortness of breath but has been worse. Recently was started on prednisone  and antibiotics states that his cough has been getting better but still short of breath He is on 2 L of FiO2 at home continuously on arrival to [place] he stated that he forgot his oxygen  in the car and he was found to be 64% on room air started on 6 L Patient finished recent course of doxycycline  Denies any hemoptysis Reports that shortness of breath is much worse with exertion but he is located at rest he has been having some subjective fevers and chills No nausea no vomiting no abdominal pain    Reports he is still wheezing  Has been feeling more sleepy  No CP  Denies significant ETOH intake  Does not smoke Regarding pertinent Chronic problems:     Hyperlipidemia - on statins Pravachol (pravastatin)  Lipid Panel     Component Value Date/Time   CHOL 241 (H) 06/24/2020 0303   TRIG 98 06/24/2020 0303   HDL 86 06/24/2020 0303   CHOLHDL 2.8 06/24/2020 0303   VLDL 20 06/24/2020 0303   LDLCALC 135 (H) 06/24/2020 0303     HTN on NOrvasc    chronic CHF diastolic/  - last echo  Recent Results (from the past 56199 hours)  ECHOCARDIOGRAM COMPLETE   Collection Time: 11/18/21 10:55 AM  Result Value   Weight 2,400   Height 65   BP 140/103   Single Plane A2C EF 43.5   Single Plane A4C EF 45.9   Calc EF 44.4    S' Lateral 3.20   AR max vel 2.21   AV Area VTI 2.35   AV Mean grad 3.0   AV Peak grad 5.9   Ao pk vel 1.21   Area-P 1/2 6.22   AV Area mean vel 2.07   Narrative      ECHOCARDIOGRAM REPORT       1. Left ventricular ejection fraction, by estimation, is 55 to 60%. The left ventricle has normal function. The left ventricle has no regional wall motion abnormalities. Left ventricular diastolic parameters are consistent with Grade I diastolic  dysfunction (impaired relaxation).  2. Right ventricular systolic function is normal. The right ventricular size is normal.  3. The mitral valve is normal in structure. No evidence of mitral valve regurgitation. No evidence of mitral stenosis.  4. The aortic valve is tricuspid. Aortic valve regurgitation is not visualized. No aortic stenosis is present.  5. The inferior vena cava is normal in size with greater than 50% respiratory variability, suggesting right atrial pressure of 3 mmHg.     *Note: Due to a large number of results and/or encounters for the requested time period, some results have not been displayed. A complete set of results can be found in Results Review.  CAD  - On Aspirin , statin, betablocker, Plavix                  -  followed by cardiology                    DM 2 -  Lab Results  Component Value Date   HGBA1C 7.6 (H) 08/26/2023   PO meds        COPD - followed by pulmonology   on baseline oxygen   2L,      Hx of CVA - with/out residual deficits on Aspirin  81 mg,     BPH - on Flomax ,        Chronic anemia - baseline hg Hemoglobin & Hematocrit  Recent Labs    12/11/23 1212 01/10/24 1320 01/10/24 1331  HGB 12.6* 12.7* 15.3   Iron/TIBC/Ferritin/ %Sat    Component Value Date/Time   IRON 34 (L) 05/08/2020 0152   TIBC 329 05/08/2020 0152   FERRITIN 15 (L) 05/08/2020 0152   IRONPCTSAT 10 (L) 05/08/2020 0152      While in ER:         Lab Orders         Comprehensive metabolic panel         Pro Brain  natriuretic peptide         Magnesium          CBC with Differential         I-Stat venous blood gas, ([facility], [facility], [facility])        CTA chest -   no PE,  Probable bilateral lung scarring and emphysematous disease, predominantly in the upper lobes.  Following Medications were ordered in ER: Medications  ipratropium-albuterol  (DUONEB) 0.5-2.5 (3) MG/3ML nebulizer solution 6 mL (6 mLs Nebulization Given 01/10/24 1043)  iohexol  (OMNIPAQUE ) 350 MG/ML injection 100 mL (75 mLs Intravenous Contrast Given 01/10/24 1439)  LORazepam  (ATIVAN ) tablet 0.5 mg (0.5 mg Oral Given 01/10/24 1508)  magnesium  sulfate IVPB 2 g 50 mL (0 g Intravenous Stopped 01/10/24 1855)  methylPREDNISolone  sodium succinate (SOLU-MEDROL ) 125 mg/2 mL injection 125 mg (125 mg Intravenous Given 01/10/24 1726)  albuterol  (PROVENTIL ,VENTOLIN ) solution continuous neb (10 mg/hr Nebulization Given 01/10/24 1706)       ED Triage Vitals  Encounter Vitals Group     BP 01/10/24 1035 (!) 175/113     Girls Systolic BP Percentile --      Girls Diastolic BP Percentile --      Boys Systolic BP Percentile --      Boys Diastolic BP Percentile --      Pulse Rate 01/10/24 1035 (!) 108     Resp 01/10/24 1035 (!) 24     Temp 01/10/24 1035 98.2 F (36.8 C)     Temp Source 01/10/24 1035 Oral     SpO2 01/10/24 1035 97 %     Weight --      Height --      Head Circumference --      Peak Flow --      Pain Score 01/10/24 1034 0     Pain Loc --      Pain Education --      Exclude from Growth Chart --   UFJK(75)@     _________________________________________ Significant initial  Findings: Abnormal Labs Reviewed  COMPREHENSIVE METABOLIC PANEL WITH GFR - Abnormal; Notable for the following components:      Result Value   CO2 33 (*)    All other components within  normal limits  PRO BRAIN NATRIURETIC PEPTIDE - Abnormal; Notable for the following components:   Pro Brain Natriuretic Peptide 328.0 (*)    All other components within normal limits  CBC  WITH DIFFERENTIAL/PLATELET - Abnormal; Notable for the following components:   WBC 11.6 (*)    Hemoglobin 12.7 (*)    MCH 25.5 (*)    RDW 17.7 (*)    Neutro Abs 7.9 (*)    All other components within normal limits  I-STAT VENOUS BLOOD GAS, ED - Abnormal; Notable for the following components:   pCO2, Ven 72.1 (*)    pO2, Ven 30 (*)    Bicarbonate 35.3 (*)    TCO2 38 (*)    Acid-Base Excess 6.0 (*)    All other components within normal limits     ECG: Ordered Personally reviewed and interpreted by me showing: HR : 101 Rhythm:Sinus tachycardia Probable left atrial enlargement Probable left ventricular hypertrophy QTC 464  BNP (last 3 results) Recent Labs    03/25/23 0102 07/20/23 0959  BNP 38.0 76.1       The recent clinical data is shown below. Vitals:   01/10/24 1430 01/10/24 1450 01/10/24 1800 01/10/24 2223  BP: (!) 162/100  (!) 154/87 133/81  Pulse: (!) 109 (!) 103 (!) 123 (!) 102  Resp: (!) 24 (!) 24 (!) 27 18  Temp:  98.2 F (36.8 C)  (!) 97.5 F (36.4 C)  TempSrc:  Oral  Oral  SpO2: 95% 95% 95% 99%     WBC     Component Value Date/Time   WBC 11.6 (H) 01/10/2024 1320   LYMPHSABS 2.3 01/10/2024 1320   MONOABS 0.8 01/10/2024 1320   EOSABS 0.5 01/10/2024 1320   BASOSABS 0.1 01/10/2024 1320        ________________________________________________________________  Venous  Blood Gas result:  pH   7.299 Potassium 3.9 mmol/L   pCO2, Ven 72.1 High Panic  mmHg Calcium , Ion 1.22 mmol/L  pO2, Ven 30 Low Panic  mmHg       __________________________________________________________ Recent Labs  Lab 01/10/24 1320 01/10/24 1331  NA 139 138  K 4.1 3.9  CO2 33*  --   GLUCOSE 90  --   BUN 11  --   CREATININE 0.66  --   CALCIUM  9.8  --   MG 2.1  --     Cr    stable,    Lab Results  Component Value Date   CREATININE 0.66 01/10/2024   CREATININE 0.84 12/11/2023   CREATININE 0.57 (L) 10/25/2023    Recent Labs  Lab 01/10/24 1320  AST 21  ALT 13   ALKPHOS 97  BILITOT 0.3  PROT 7.7  ALBUMIN 4.0   Lab Results  Component Value Date   CALCIUM  9.8 01/10/2024    Plt: Lab Results  Component Value Date   PLT 327 01/10/2024       Recent Labs  Lab 01/10/24 1320 01/10/24 1331  WBC 11.6*  --   NEUTROABS 7.9*  --   HGB 12.7* 15.3  HCT 41.8 45.0  MCV 83.9  --   PLT 327  --     HG/HCT  stable,     Component Value Date/Time   HGB 15.3 01/10/2024 1331   HCT 45.0 01/10/2024 1331   MCV 83.9 01/10/2024 1320     _______________________________________________ Hospitalist was called for admission for COPD exacerbation   The following Work up has been ordered so far:  Orders Placed This Encounter  Procedures  CT Angio Chest PE W and/or Wo Contrast   Comprehensive metabolic panel   Pro Brain natriuretic peptide   Magnesium    CBC with Differential   Consult to hospitalist   I-Stat venous blood gas, ([facility], [facility], [facility])   ED EKG   EKG 12-Lead   Place in observation (patient's expected length of stay will be less than 2 midnights)     OTHER Significant initial  Findings:  labs showing:     DM  labs:  HbA1C: Recent Labs    08/26/23 1837  HGBA1C 7.6*       CBG (last 3)  No results for input(s): GLUCAP in the last 72 hours.        Cultures:    Component Value Date/Time   SDES  07/19/2023 1101    BLOOD RIGHT ARM Performed at [facility], [address], [place], [place] [address]    SPECREQUEST  07/19/2023 1101    BOTTLES DRAWN AEROBIC ONLY Blood Culture adequate volume Performed at [facility], [address] [place], [place] [address]    CULT  07/19/2023 1101    NO GROWTH 5 DAYS Performed at [facility], [address] [place], [place] [address]    REPTSTATUS 07/24/2023 FINAL 07/19/2023 1101     Radiological Exams on Admission: CT Angio Chest PE W and/or Wo Contrast Result Date: 01/10/2024 EXAM: CTA of the Chest with contrast for PE 01/10/2024 04:03:09 PM  TECHNIQUE: CTA of the chest was performed after the administration of 75 mL of intravenous iohexol  (OMNIPAQUE ) 350 MG/ML injection 100 mL IOHEXOL  350 MG/ML SOLN. Multiplanar reformatted images are provided for review. MIP images are provided for review. Automated exposure control, iterative reconstruction, and/or weight based adjustment of the mA/kV was utilized to reduce the radiation dose to as low as reasonably achievable. COMPARISON: None available. CLINICAL HISTORY: Pulmonary embolism (PE) suspected, high prob. FINDINGS: PULMONARY ARTERIES: Pulmonary arteries are adequately opacified for evaluation. No pulmonary embolism. Main pulmonary artery is normal in caliber. MEDIASTINUM: The heart and pericardium demonstrate no acute abnormality. There is no acute abnormality of the thoracic aorta. LYMPH NODES: No mediastinal, hilar or axillary lymphadenopathy. LUNGS AND PLEURA: Elevated left hemidiaphragm stable. Probable bilateral lung scarring is noted. Emphysematous disease is noted predominantly in the upper lobes. Minimal left basilar subsegmental atelectasis may be present. No focal consolidation or pulmonary edema. No pleural effusion or pneumothorax. UPPER ABDOMEN: Limited images of the upper abdomen are unremarkable. SOFT TISSUES AND BONES: No acute bone or soft tissue abnormality. IMPRESSION: 1. No pulmonary embolism. 2. Stable elevated left hemidiaphragm. 3. Probable bilateral lung scarring and emphysematous disease, predominantly in the upper lobes. 4. Given emphysema and typical screening age, consider evaluation for a low-dose CT lung cancer screening program. Electronically signed by: Lynwood Seip MD 01/10/2024 04:50 PM EST RP Workstation: [id number]   _______________________________________________________________________________________________________ Latest  Blood pressure 133/81, pulse (!) 102, temperature (!) 97.5 F (36.4 C), temperature source Oral, resp. rate 18, SpO2 99%.   Vitals  labs and  radiology finding personally reviewed  Review of Systems:    Pertinent positives include:  shortness of breath at rest.  dyspnea on exertion excess mucus,  Constitutional:  No weight loss, night sweats, Fevers, chills, fatigue, weight loss  HEENT:  No headaches, Difficulty swallowing,Tooth/dental problems,Sore throat,  No sneezing, itching, ear ache, nasal congestion, post nasal drip,  Cardio-vascular:  No chest pain, Orthopnea, PND, anasarca, dizziness, palpitations.no Bilateral lower extremity swelling  GI:  No heartburn,  indigestion, abdominal pain, nausea, vomiting, diarrhea, change in bowel habits, loss of appetite, melena, blood in stool, hematemesis Resp:   no productive cough, No non-productive cough, No coughing up of blood.No change in color of mucus.No wheezing. Skin:  no rash or lesions. No jaundice GU:  no dysuria, change in color of urine, no urgency or frequency. No straining to urinate.  No flank pain.  Musculoskeletal:  No joint pain or no joint swelling. No decreased range of motion. No back pain.  Psych:  No change in mood or affect. No depression or anxiety. No memory loss.  Neuro: no localizing neurological complaints, no tingling, no weakness, no double vision, no gait abnormality, no slurred speech, no confusion  All systems reviewed and apart from HOPI all are negative _______________________________________________________________________________________________ Past Medical History:   Past Medical History:  Diagnosis Date   Anxiety    Arthritis    Asthma    Chronic pain 05/06/2020   COPD (chronic obstructive pulmonary disease) (HCC)    CVA (cerebral vascular accident) (HCC)    2010; 06/2020   Depression    Diabetes mellitus type 2 in [facility])    Dyspnea    ED (erectile dysfunction)    Hyperlipidemia    Hypertension    Sarcoid    Tobacco dependence 05/06/2020      Past Surgical History:  Procedure Laterality Date   LUNG SURGERY      removed tissue    Social History:  Ambulatory  cane,     reports that he quit smoking about 3 years ago. His smoking use included cigarettes. He started smoking about 37 years ago. He has a 34 pack-year smoking history. He has never been exposed to tobacco smoke. He has never used smokeless tobacco. He reports that he does not drink alcohol and does not use drugs.   Family History:   Family History  Problem Relation Age of Onset   Hypertension Mother    Diabetes Maternal Grandmother    Colon cancer Neg Hx    Esophageal cancer Neg Hx    Rectal cancer Neg Hx    Inflammatory bowel disease Neg Hx    Liver disease Neg Hx    Pancreatic cancer Neg Hx    ______________________________________________________________________________________________ Allergies: Allergies  Allergen Reactions   Insulins Swelling and Other (See Comments)    04/2020. Do not use Novolog  insulin . Swelling of tongue   Porcine (Pork) Protein-Containing Drug Products Nausea And Vomiting and Other (See Comments)    Cultural-Muslim and NOT TOLERATED, AS WELL   Acetaminophen  Nausea Only and Other (See Comments)    Abdominal sensitivity and Abdominal discomfort   Ibuprofen Other (See Comments)    Abdominal sensitivity/Abdominal discomfort   Lisinopril Swelling, Rash and Other (See Comments)    Tongue swelling   Shrimp [Shellfish Allergy ] Itching   Insulin  Aspart Swelling and Other (See Comments)    Tongue swelling   Amoxicillin -Pot Clavulanate Nausea And Vomiting     Prior to Admission medications   Medication Sig Start Date End Date Taking? Authorizing Provider  albuterol  (VENTOLIN  HFA) 108 (90 Base) MCG/ACT inhaler Inhale 1-2 puffs into the lungs every 6 (six) hours as needed for wheezing or shortness of breath. Patient taking differently: Inhale 2 puffs into the lungs every 6 (six) hours as needed for shortness of breath. 08/29/23  Yes Sebastian Toribio GAILS, MD  amLODipine  (NORVASC ) 10 MG tablet Take 1 tablet  (10 mg total) by mouth daily. 10/03/21  Yes Rai, Nydia POUR, MD  aspirin  EC 81 MG tablet Take 81 mg by mouth daily. Swallow whole.   Yes [provider]  budesonide -formoterol  (SYMBICORT ) 160-4.5 MCG/ACT inhaler Inhale 2 puffs into the lungs 2 (two) times daily. 08/29/23  Yes Sebastian Toribio GAILS, MD  Cholecalciferol (VITAMIN D3) 50 MCG (2000 UT) CHEW Chew 4,000 Units by mouth daily.   Yes [provider]  docusate sodium  (COLACE) 100 MG capsule Take 100 mg by mouth 2 (two) times daily as needed for mild constipation.   Yes [provider]  doxycycline  (VIBRAMYCIN ) 100 MG capsule Take 1 capsule (100 mg total) by mouth 2 (two) times daily. 12/11/23  Yes Sofia, Leslie K, PA-C  famotidine  (PEPCID ) 20 MG tablet One after supper Patient taking differently: Take 20 mg by mouth daily as needed for heartburn or indigestion. 11/22/21  Yes Darlean Ozell NOVAK, MD  ferrous sulfate  325 (65 FE) MG tablet Take 325 mg by mouth every Monday, Wednesday, and Friday.   Yes [provider]  fluticasone  (FLONASE ) 50 MCG/ACT nasal spray Place 2 sprays into both nostrils daily for 10 days. Patient taking differently: Place 2 sprays into both nostrils daily as needed for allergies or rhinitis (or seasonal allergies). 08/30/23  Yes Sebastian Toribio GAILS, MD  guaiFENesin  (MUCINEX ) 600 MG 12 hr tablet Take 600 mg by mouth in the morning.   Yes [provider]  hydrOXYzine  (ATARAX ) 25 MG tablet Take 25 mg by mouth at bedtime. 08/08/23  Yes [provider]  metFORMIN  (GLUCOPHAGE ) 500 MG tablet Take 1 tablet (500 mg total) by mouth 2 (two) times daily with a meal. Patient taking differently: Take 500 mg by mouth 2 (two) times daily before a meal. 10/03/21  Yes Rai, Ripudeep K, MD  montelukast  (SINGULAIR ) 10 MG tablet Take 10 mg by mouth at bedtime.   Yes [provider]  oxyCODONE  (OXYCONTIN ) 10 mg 12 hr tablet Take 10 mg by mouth every 12 (twelve) hours.   Yes [provider]  Oxycodone  HCl 20 MG TABS Take 20 mg by mouth every 6 (six) hours as needed (pain).   Yes [provider]  OXYGEN  Inhale 2-3 L/min into the lungs as needed (for shortness of breath when exerted).   Yes [provider]  pantoprazole  (PROTONIX ) 40 MG tablet Take 1 tablet (40 mg total) by mouth daily. 07/02/19  Yes Elgergawy, Brayton RAMAN, MD  polyethylene glycol (MIRALAX  / GLYCOLAX ) 17 g packet Take 17 g by mouth daily. Patient taking differently: Take 17 g by mouth daily as needed for moderate constipation. 08/30/23  Yes Sebastian Toribio GAILS, MD  pravastatin (PRAVACHOL) 10 MG tablet Take 10 mg by mouth at bedtime.   Yes [provider]  prazosin  (MINIPRESS ) 1 MG capsule Take 1 mg by mouth at bedtime.   Yes [provider]  senna-docusate (SENOKOT-S) 8.6-50 MG tablet Take 1 tablet by mouth 2 (two) times daily. Patient taking differently: Take 1 tablet by mouth 2 (two) times daily as needed for mild constipation or moderate constipation. 08/29/23  Yes Sebastian Toribio GAILS, MD  sildenafil  (VIAGRA ) 100 MG tablet Take 1 tablet (100 mg total) by mouth daily as needed. Patient taking differently: Take 100 mg by mouth daily as needed for erectile dysfunction. 09/28/23  Yes   tamsulosin  (FLOMAX ) 0.4 MG CAPS capsule Take 1 capsule (0.4 mg total) by mouth daily. 08/29/23  Yes Sebastian Toribio GAILS, MD  Tiotropium Bromide  Monohydrate 2.5 MCG/ACT AERS Inhale 2 puffs into the lungs daily.   Yes [provider]  vitamin C (ASCORBIC ACID ) 250 MG tablet Take 250 mg by mouth daily.   Yes [provider]  clopidogrel  (PLAVIX ) 75 MG tablet Take 1 tablet (75 mg total) by mouth daily. Patient not taking: Reported on 10/24/2023 08/30/23   Sebastian Toribio GAILS, MD  EPINEPHrine  0.3 mg/0.3 mL IJ SOAJ injection Inject 0.3 mg into the muscle as needed for anaphylaxis. 08/08/23   [provider]  ipratropium-albuterol  (DUONEB) 0.5-2.5 (3) MG/3ML SOLN Take 3 mLs by nebulization 3  (three) times daily for 5 days, THEN 3 mLs every 6 (six) hours as needed for up to 5 days. Patient taking differently: Nebulize 3 ml's and inhale into the lungs 4-5 times a day 08/29/23 11/09/23  Sebastian Toribio GAILS, MD  loratadine  (CLARITIN ) 10 MG tablet Take 1 tablet (10 mg total) by mouth daily. Patient not taking: Reported on 10/25/2023 01/03/22     naloxone  (NARCAN ) nasal spray 4 mg/0.1 mL Place 1 spray into the nose as needed (opioid overdose - respiratory distress). 08/20/21   [provider]  predniSONE  (DELTASONE ) 50 MG tablet One tablet a day for 5 days Patient not taking: Reported on 01/10/2024 12/11/23   Flint Sonny POUR, PA-C  promethazine -dextromethorphan  (PROMETHAZINE -DM) 6.25-15 MG/5ML syrup Take 5 mLs by mouth 4 (four) times daily as needed for cough. Patient not taking: Reported on 01/10/2024 12/11/23   Flint Sonny POUR, PA-C    ___________________________________________________________________________________________________ Physical Exam:    01/10/2024   10:23 PM 01/10/2024    6:00 PM 01/10/2024    2:50 PM  Vitals with BMI  Systolic 133 154   Diastolic 81 87   Pulse 102 123 103     1. General:  in No  Acute distress   Chronically ill   -appearing 2. Psychological: Alert and   Oriented 3. Head/ENT:    Dry Mucous Membranes                          Head Non traumatic, neck supple                            Poor Dentition 4. SKIN: decreased Skin turgor,  Skin clean Dry and intact no rash    5. Heart: Regular rate and rhythm no*** Murmur, no Rub or gallop 6. Lungs: ***Clear to auscultation bilaterally, no wheezes or crackles   7. Abdomen: Soft,  non-tender, Non distended   bowel sounds present 8. Lower extremities: no clubbing, cyanosis, no  edema 9. Neurologically Grossly intact, moving all 4 extremities equally   10. MSK: Normal range of motion    Chart has been  reviewed  ______________________________________________________________________________________________  Assessment/Plan 63 y.o. male with medical history significant of  COPD, DM2, HTN, CVA, HLD    Admitted for COPD exacerbation   Present on Admission:  COPD exacerbation (HCC)  COPD with acute exacerbation (HCC)  Hyperlipidemia associated with type 2 diabetes mellitus (HCC)  BPH (benign prostatic hyperplasia)  Chronic respiratory failure with hypoxia (HCC)  Chronic pain  Lactic acidosis  Acute on chronic respiratory failure with hypoxia and hypercapnia (HCC)     COPD with acute exacerbation (HCC)  -  - Will initiate: Steroid taper  -  Antibiotics azythro - Albuterol   PRN, - scheduled duoneb,  -  Breo or Dulera  at discharge   -  Mucinex .  Titrate O2 to saturation >90%. Follow patients respiratory status.  Order nfluenza PCR  VBG    Type 2 diabetes mellitus (HCC) Monitor BG   History of CVA (cerebrovascular accident) Continue plavix  and aspirin   Hyperlipidemia associated with type 2 diabetes mellitus (HCC) Continue pravachol 10 mg po q day  BPH (benign prostatic hyperplasia) Continue flomax   Chronic respiratory failure with hypoxia (HCC) Due to copd  Chronic pain Continue OxyContin  if no evidence of oversedation  Lactic acidosis In the setting of increased work of breathing  Acute on chronic respiratory failure with hypoxia and hypercapnia (HCC)  this patient has acute respiratory failure with Hypoxia and  Hypercarbia as documented by the presence of following: O2 saturatio< 90% on RA pH <7.35 with pCO2 >50  Likely due to:   COPD exacerbation, COVID pneumonia Provide O2 therapy and titrate as needed  Continuous pulse ox   check Pulse ox with ambulation prior to discharge    flutter valve ordered Patient is alert and oriented easily arousable Repeat VBG  There is a combination of metabolic acidosis in the setting of elevated lactic acid patient likely  has chronic respiratory acidosis which is somewhat well compensated given elevated bicarb Avoid over sedating medications   Other plan as per orders.  DVT prophylaxis:  SCD       Code Status:    Code Status: Prior FULL CODE as per patient  I had personally discussed CODE STATUS with patient  ACP has  Family Communication:   Family not at  Bedside   Diet heart healthy/ diabetic   Disposition Plan:       To home once workup is complete and patient is stable   Following barriers for discharge:                                                         Work of breathing improves       Consult Orders  (From admission, onward)           Start     Ordered   01/10/24 1702  Consult to hospitalist  Florence Bare at [facility] for hosp consult 17:08 TC  Once       Provider:  (Not yet assigned)  Question Answer Comment  Place call to: [facility]   Reason for Consult Admit      01/10/24 1702                               Would benefit from PT/OT eval prior to DC  Ordered                     Consults called:    NONE   Admission status:  ED Disposition     ED Disposition  Admit   Condition  --   Comment  Hospital Area: [facility] [[id number]]  Level of Care: Med-Surg [16]  Interfacility transfer: Yes  Diagnosis: COPD exacerbation [facility]) [[id number]]  Admitting Physician: VERNON RANKS [[id number]]  Attending Physician: VERNON RANKS [[id number]]          Obs      Level of care      medical floor       Bindu Docter 01/11/2024, 12:03 AM    [facility]     after 2 AM please page floor coverage  If 7AM-7PM, please contact the day team taking care of the patient using [email or web address]

## 2024-01-10 NOTE — ED Provider Notes (Signed)
 [place] [facility] DE[facility]MENT AT [facility] Provider Note   CSN: [id number] Arrival date & time: 01/10/24  1027     History Chief Complaint  Patient presents with   Shortness of Breath    HPI: Glenn [name] is a 63 y.o. male with history pertinent for tobacco use disorder in remission, COPD on 2 L home oxygen , HTN, T2DM, prior CVA, HLD who presents complaining of shortness of breath, cough. Patient arrived via POV.  History provided by patient.  No interpreter required during this encounter.  Patient reports that he has chronic chest pain and shortness of breath with exertion.  Reports that he recently had worsening of his chronic shortness of breath, and was seen in the emergency department and prescribed steroids and an antibiotic.  Reports that he completed his course of doxycycline  as well as his steroids.  Reports that the cough has partially improved.  Cough is nonproductive, no hemoptysis.  However he continues to be short of breath.  Reports that he is not short of breath at rest, however is significantly short of breath with exertion.  Also reports chest pain with exertion that is at baseline.  Endorses subjective fevers and chills, has not objectively measured his fever at home, denies abdominal pain, nausea, vomiting, diarrhea, dysuria.  Patient's recorded medical, surgical, social, medication list and allergies were reviewed in the Snapshot window as part of the initial history.   Prior to Admission medications   Medication Sig Start Date End Date Taking? Authorizing Provider  albuterol  (VENTOLIN  HFA) 108 (90 Base) MCG/ACT inhaler Inhale 1-2 puffs into the lungs every 6 (six) hours as needed for wheezing or shortness of breath. Patient taking differently: Inhale 2 puffs into the lungs every 6 (six) hours as needed for shortness of breath. 08/29/23  Yes Sebastian Toribio GAILS, MD  amLODipine  (NORVASC ) 10 MG tablet Take 1 tablet (10 mg total) by mouth daily. 10/03/21  Yes  Rai, Ripudeep K, MD  aspirin  EC 81 MG tablet Take 81 mg by mouth daily. Swallow whole.   Yes [provider]  budesonide -formoterol  (SYMBICORT ) 160-4.5 MCG/ACT inhaler Inhale 2 puffs into the lungs 2 (two) times daily. 08/29/23  Yes Sebastian Toribio GAILS, MD  Cholecalciferol (VITAMIN D3) 50 MCG (2000 UT) CHEW Chew 4,000 Units by mouth daily.   Yes [provider]  docusate sodium  (COLACE) 100 MG capsule Take 100 mg by mouth 2 (two) times daily as needed for mild constipation.   Yes [provider]  famotidine  (PEPCID ) 20 MG tablet One after supper Patient taking differently: Take 20 mg by mouth daily as needed for heartburn or indigestion. 11/22/21  Yes Darlean Ozell NOVAK, MD  ferrous sulfate  325 (65 FE) MG tablet Take 325 mg by mouth every Monday, Wednesday, and Friday.   Yes [provider]  fluticasone  (FLONASE ) 50 MCG/ACT nasal spray Place 2 sprays into both nostrils daily for 10 days. Patient taking differently: Place 2 sprays into both nostrils daily as needed for allergies or rhinitis (or seasonal allergies). 08/30/23  Yes Sebastian Toribio GAILS, MD  guaiFENesin  (MUCINEX ) 600 MG 12 hr tablet Take 600 mg by mouth in the morning.   Yes [provider]  hydrOXYzine  (ATARAX ) 25 MG tablet Take 25 mg by mouth at bedtime. 08/08/23  Yes [provider]  metFORMIN  (GLUCOPHAGE ) 500 MG tablet Take 1 tablet (500 mg total) by mouth 2 (two) times daily with a meal. Patient taking differently: Take 500 mg by mouth 2 (two) times daily before  a meal. 10/03/21  Yes Rai, Ripudeep K, MD  montelukast  (SINGULAIR ) 10 MG tablet Take 10 mg by mouth at bedtime.   Yes [provider]  oxyCODONE  (OXYCONTIN ) 10 mg 12 hr tablet Take 10 mg by mouth every 12 (twelve) hours.   Yes [provider]  Oxycodone  HCl 20 MG TABS Take 20 mg by mouth every 6 (six) hours as needed (pain).   Yes [provider]  OXYGEN  Inhale 2-3 L/min into the lungs as needed (for  shortness of breath when exerted).   Yes [provider]  pantoprazole  (PROTONIX ) 40 MG tablet Take 1 tablet (40 mg total) by mouth daily. 07/02/19  Yes Elgergawy, Brayton RAMAN, MD  polyethylene glycol (MIRALAX  / GLYCOLAX ) 17 g packet Take 17 g by mouth daily. Patient taking differently: Take 17 g by mouth daily as needed for moderate constipation. 08/30/23  Yes Sebastian Toribio GAILS, MD  pravastatin  (PRAVACHOL ) 10 MG tablet Take 10 mg by mouth at bedtime.   Yes [provider]  prazosin  (MINIPRESS ) 1 MG capsule Take 1 mg by mouth at bedtime.   Yes [provider]  senna-docusate (SENOKOT-S) 8.6-50 MG tablet Take 1 tablet by mouth 2 (two) times daily. Patient taking differently: Take 1 tablet by mouth 2 (two) times daily as needed for mild constipation or moderate constipation. 08/29/23  Yes Sebastian Toribio GAILS, MD  sildenafil  (VIAGRA ) 100 MG tablet Take 1 tablet (100 mg total) by mouth daily as needed. Patient taking differently: Take 100 mg by mouth daily as needed for erectile dysfunction. 09/28/23  Yes   tamsulosin  (FLOMAX ) 0.4 MG CAPS capsule Take 1 capsule (0.4 mg total) by mouth daily. 08/29/23  Yes Sebastian Toribio GAILS, MD  Tiotropium Bromide  Monohydrate 2.5 MCG/ACT AERS Inhale 2 puffs into the lungs daily.   Yes [provider]  vitamin C (ASCORBIC ACID ) 250 MG tablet Take 250 mg by mouth daily.   Yes [provider]  azithromycin  (ZITHROMAX ) 500 MG tablet Take 1 tablet (500 mg total) by mouth daily for 2 days. 01/14/24 01/16/24  Gherghe, Costin M, MD  clopidogrel  (PLAVIX ) 75 MG tablet Take 1 tablet (75 mg total) by mouth daily. Patient not taking: Reported on 10/24/2023 08/30/23   Sebastian Toribio GAILS, MD  EPINEPHrine  0.3 mg/0.3 mL IJ SOAJ injection Inject 0.3 mg into the muscle as needed for anaphylaxis. 08/08/23   [provider]  ipratropium-albuterol  (DUONEB) 0.5-2.5 (3) MG/3ML SOLN Take 3 mLs by nebulization 3 (three) times daily for 5 days, THEN 3 mLs  every 6 (six) hours as needed for up to 5 days. Patient taking differently: Nebulize 3 ml's and inhale into the lungs 4-5 times a day 08/29/23 11/09/23  Sebastian Toribio GAILS, MD  loratadine  (CLARITIN ) 10 MG tablet Take 1 tablet (10 mg total) by mouth daily. Patient not taking: Reported on 10/25/2023 01/03/22     naloxone  (NARCAN ) nasal spray 4 mg/0.1 mL Place 1 spray into the nose as needed (opioid overdose - respiratory distress). 08/20/21   [provider]  predniSONE  (DELTASONE ) 10 MG tablet Take 4 tablets (40 mg total) by mouth daily with breakfast for 3 days, THEN 3 tablets (30 mg total) daily with breakfast for 3 days, THEN 2 tablets (20 mg total) daily with breakfast for 3 days, THEN 1 tablet (10 mg total) daily with breakfast for 3 days. 01/15/24 01/27/24  Gherghe, Costin M, MD  promethazine -dextromethorphan  (PROMETHAZINE -DM) 6.25-15 MG/5ML syrup Take 5 mLs by mouth 4 (four) times daily as needed for  cough. 01/14/24   Trixie Nilda HERO, MD     Allergies: Insulins, Porcine (pork) protein-containing drug products, Acetaminophen , Ibuprofen, Lisinopril, Shrimp [shellfish allergy ], Insulin  aspart, and Amoxicillin -pot clavulanate   Review of Systems   ROS as per HPI  Physical Exam Updated Vital Signs BP (!) 147/97 (BP Location: Left Arm)   Pulse 86   Temp 97.8 F (36.6 C) (Oral)   Resp (!) 21   Ht 5' 5 (1.651 m)   Wt 62.6 kg   SpO2 99%   BMI 22.95 kg/m  Physical Exam Vitals and nursing note reviewed.  Constitutional:      General: He is not in acute distress.    Appearance: He is well-developed.  HENT:     Head: Normocephalic and atraumatic.  Eyes:     Conjunctiva/sclera: Conjunctivae normal.  Cardiovascular:     Rate and Rhythm: Regular rhythm. Tachycardia present.     Heart sounds: No murmur heard. Pulmonary:     Effort: Pulmonary effort is normal. No respiratory distress.     Breath sounds: Decreased breath sounds (Diffusely) present. No wheezing.  Abdominal:      Palpations: Abdomen is soft.     Tenderness: There is no abdominal tenderness.  Musculoskeletal:        General: No swelling.     Cervical back: Neck supple.  Skin:    General: Skin is warm and dry.     Capillary Refill: Capillary refill takes less than 2 seconds.  Neurological:     Mental Status: He is alert.  Psychiatric:        Mood and Affect: Mood normal.     ED Course/ Medical Decision Making/ A&P    Procedures Procedures   Medications Ordered in ED Medications  0.9 %  sodium chloride  infusion (0 mLs Intravenous Stopped 01/11/24 1528)  ipratropium-albuterol  (DUONEB) 0.5-2.5 (3) MG/3ML nebulizer solution 6 mL (6 mLs Nebulization Given 01/10/24 1043)  iohexol  (OMNIPAQUE ) 350 MG/ML injection 100 mL (75 mLs Intravenous Contrast Given 01/10/24 1439)  LORazepam  (ATIVAN ) tablet 0.5 mg (0.5 mg Oral Given 01/10/24 1508)  magnesium  sulfate IVPB 2 g 50 mL (0 g Intravenous Stopped 01/10/24 1855)  methylPREDNISolone  sodium succinate (SOLU-MEDROL ) 125 mg/2 mL injection 125 mg (125 mg Intravenous Given 01/10/24 1726)  albuterol  (PROVENTIL ,VENTOLIN ) solution continuous neb (10 mg/hr Nebulization Given 01/10/24 1706)  azithromycin  (ZITHROMAX ) 500 mg in sodium chloride  0.9 % 250 mL IVPB (500 mg Intravenous New Bag/Given 01/11/24 0017)  methylPREDNISolone  sodium succinate (SOLU-MEDROL ) 125 mg/2 mL injection 80 mg (80 mg Intravenous Given 01/11/24 0537)    Medical Decision Making:   OMAURI BOEVE is a 63 y.o. male who presents for shortness of breath as per above.  Physical exam is pertinent for increased oxygen  requirement from baseline, diminished lung sounds diffusely, tachycardia.   The differential includes but is not limited to ACS, volume overload, COPD exacerbation, acute hypoxic respiratory failure, pneumonia, pleural effusion, pulmonary embolism.  Independent historian: None  External data reviewed: Labs: reviewed prior labs for baseline  Initial Plan:  Screening labs including CBC  and Metabolic panel to evaluate for infectious or metabolic etiology of disease.  VBG to assess for COPD exacerbation Magnesium  level to assess for hypomagnesemia contributing to COPD exacerbation BNP to assess volume status CT PE study to evaluate for structural/infectious intra-thoracic pathology.  EKG and serial troponin to evaluate for cardiac pathology. Objective evaluation as below reviewed   Labs: Ordered, Independent interpretation, and Details: Initial and delta troponin undetectable.  BNP mildly elevated at  300.  VBG with hypercarbia, however likely chronic given no significant acidosis with pH of 7.299.  Magnesium  WNL.  CMP without AKI, emergent electrolyte derangement, emergent LFT abnormality.  CBC with mild nonspecific leukocytosis to 11.6, potentially related to recent steroid use.  Mildstable anemia in comparison to prior.  No thrombocytopenia.  Radiology: Ordered CT PE study had been ordered but had yet to be obtained at the time of handoff.  EKG/Medicine tests: Ordered and Independent interpretation EKG Interpretation: Sinus tachycardia Probable left atrial enlargement Probable left ventricular hypertrophy No significant change since last tracing Confirmed by Rogelia Satterfield ([id number]) on 01/10/2024 2:26:23 PM  Interventions: DuoNeb, Ativan   See the EMR for full details regarding lab and imaging results.  Patient presents to the emergency department for persistent shortness of breath.  Is tachycardic, reportedly was hypoxic on his home oxygen  on arrival, therefore was uptitrated to 5 L.  On my exam, patient was saturating 100%, therefore I down titrated the patient to his home 2 L, and he did not desaturate during my exam.  Do feel that patient warrants broad lab workup as well as imaging as per above.  Patient unfortunately did quickly require rehab titration in oxygen  to 5 L nasal cannula.  VBG returned with evidence of chronic hypercarbia, however pH reassuring, therefore acute  on chronic hypercarbia less likely.  BNP overall minimally elevated, do not feel that volume overload is primary etiology of patient's presentation, similarly reassuring troponins make ACS less likely.  CBC does have mild leukocytosis, however this is nonspecific particularly in the setting of recent steroid use.  Patient's metabolic panel overall reassuring.  Patient is persistently tachycardic, therefore do feel that patient requires CT PE study.  Patient became increasingly short of breath upon the first attempt to gain the scan, therefore will give very small dose of Ativan  with 0.5 mg, and then retry to obtain scan.  This was pending at the time of handoff.  Presentation is most consistent with acute complicated illness and Current presentation is complicated by underlying chronic conditions  Discussion of management or test interpretations with external provider(s): None by the time of handoff  Risk Drugs:OTC drugs and Prescription drug management Treatment: Pending at the time of handoff  Disposition: HANDOFF: At the time of signout, the patients CT PE study had not yet been completed. I transferred care of the patient at the time of signout to Dr. Towana. I informed the incoming care provider of the patient's history, status, and management plan. I addressed all of their concerns and/or questions to the best of my ability. Please refer to the incoming care provider's note for details regarding the remainder of the patient's ED course and disposition.  MDM generated using voice dictation software and may contain dictation errors.  Please contact me for any clarification or with any questions.  Clinical Impression:  1. SOB (shortness of breath)      Admit   Final Clinical Impression(s) / ED Diagnoses Final diagnoses:  SOB (shortness of breath)    Rx / DC Orders ED Discharge Orders          Ordered    predniSONE  (DELTASONE ) 10 MG tablet  Q breakfast        01/14/24 0958     promethazine -dextromethorphan  (PROMETHAZINE -DM) 6.25-15 MG/5ML syrup  4 times daily PRN        01/14/24 0956    azithromycin  (ZITHROMAX ) 500 MG tablet  Daily        01/14/24 0958  Rogelia Jerilynn RAMAN, MD 01/15/24 [phone]

## 2024-01-10 NOTE — ED Triage Notes (Signed)
 He reports chronic shortness of breath, which has been worse recently. He states he was seen here and was placed on steroid(s) and antibiotic. He tells me his cough is better, but his shortness of breath is persistent. He also states he is on O2 at 2 l.p.m. continuously at home.

## 2024-01-10 NOTE — ED Notes (Signed)
 Called [name] at [facility] for transport 18:40 TC

## 2024-01-10 NOTE — Subjective & Objective (Signed)
 History of COPD presents with worsening shortness of breath patient baseline has shortness of breath but has been worse. Recently was started on prednisone  and antibiotics states that his cough has been getting better but still short of breath He is on 2 L of FiO2 at home continuously on arrival to [place] he stated that he forgot his oxygen  in the car and he was found to be 64% on room air started on 6 L Patient finished recent course of doxycycline  Denies any hemoptysis Reports that shortness of breath is much worse with exertion but he is located at rest he has been having some subjective fevers and chills No nausea no vomiting no abdominal pain

## 2024-01-10 NOTE — Assessment & Plan Note (Signed)
 Continue OxyContin  if no evidence of oversedation

## 2024-01-10 NOTE — ED Notes (Addendum)
 Pt arrived on room air. Pt wears 2L at baseline.He reports he left his oxygen  in the car. Pt's O2 saturation was 64%. RT placed pt on 6L.

## 2024-01-10 NOTE — Assessment & Plan Note (Addendum)
Monitor BG

## 2024-01-11 ENCOUNTER — Other Ambulatory Visit: Payer: Self-pay | Admitting: Student

## 2024-01-11 ENCOUNTER — Other Ambulatory Visit: Payer: Self-pay

## 2024-01-11 ENCOUNTER — Observation Stay (HOSPITAL_COMMUNITY)

## 2024-01-11 DIAGNOSIS — Z833 Family history of diabetes mellitus: Secondary | ICD-10-CM | POA: Diagnosis not present

## 2024-01-11 DIAGNOSIS — I110 Hypertensive heart disease with heart failure: Secondary | ICD-10-CM | POA: Diagnosis present

## 2024-01-11 DIAGNOSIS — E785 Hyperlipidemia, unspecified: Secondary | ICD-10-CM | POA: Diagnosis not present

## 2024-01-11 DIAGNOSIS — Z7902 Long term (current) use of antithrombotics/antiplatelets: Secondary | ICD-10-CM | POA: Diagnosis not present

## 2024-01-11 DIAGNOSIS — D869 Sarcoidosis, unspecified: Secondary | ICD-10-CM | POA: Diagnosis present

## 2024-01-11 DIAGNOSIS — E440 Moderate protein-calorie malnutrition: Secondary | ICD-10-CM | POA: Diagnosis present

## 2024-01-11 DIAGNOSIS — I2510 Atherosclerotic heart disease of native coronary artery without angina pectoris: Secondary | ICD-10-CM | POA: Diagnosis present

## 2024-01-11 DIAGNOSIS — E872 Acidosis, unspecified: Secondary | ICD-10-CM | POA: Diagnosis present

## 2024-01-11 DIAGNOSIS — Z7982 Long term (current) use of aspirin: Secondary | ICD-10-CM | POA: Diagnosis not present

## 2024-01-11 DIAGNOSIS — Z8249 Family history of ischemic heart disease and other diseases of the circulatory system: Secondary | ICD-10-CM | POA: Diagnosis not present

## 2024-01-11 DIAGNOSIS — E874 Mixed disorder of acid-base balance: Secondary | ICD-10-CM | POA: Diagnosis present

## 2024-01-11 DIAGNOSIS — I272 Pulmonary hypertension, unspecified: Secondary | ICD-10-CM | POA: Diagnosis not present

## 2024-01-11 DIAGNOSIS — D649 Anemia, unspecified: Secondary | ICD-10-CM | POA: Diagnosis present

## 2024-01-11 DIAGNOSIS — I2723 Pulmonary hypertension due to lung diseases and hypoxia: Secondary | ICD-10-CM | POA: Diagnosis present

## 2024-01-11 DIAGNOSIS — J9621 Acute and chronic respiratory failure with hypoxia: Secondary | ICD-10-CM | POA: Diagnosis present

## 2024-01-11 DIAGNOSIS — E1169 Type 2 diabetes mellitus with other specified complication: Secondary | ICD-10-CM | POA: Diagnosis present

## 2024-01-11 DIAGNOSIS — Z79899 Other long term (current) drug therapy: Secondary | ICD-10-CM | POA: Diagnosis not present

## 2024-01-11 DIAGNOSIS — N400 Enlarged prostate without lower urinary tract symptoms: Secondary | ICD-10-CM | POA: Diagnosis present

## 2024-01-11 DIAGNOSIS — Z1152 Encounter for screening for COVID-19: Secondary | ICD-10-CM | POA: Diagnosis not present

## 2024-01-11 DIAGNOSIS — Z7951 Long term (current) use of inhaled steroids: Secondary | ICD-10-CM | POA: Diagnosis not present

## 2024-01-11 DIAGNOSIS — R079 Chest pain, unspecified: Secondary | ICD-10-CM

## 2024-01-11 DIAGNOSIS — Z7984 Long term (current) use of oral hypoglycemic drugs: Secondary | ICD-10-CM | POA: Diagnosis not present

## 2024-01-11 DIAGNOSIS — J441 Chronic obstructive pulmonary disease with (acute) exacerbation: Secondary | ICD-10-CM | POA: Diagnosis present

## 2024-01-11 DIAGNOSIS — G8929 Other chronic pain: Secondary | ICD-10-CM | POA: Diagnosis present

## 2024-01-11 DIAGNOSIS — Z9981 Dependence on supplemental oxygen: Secondary | ICD-10-CM | POA: Diagnosis not present

## 2024-01-11 DIAGNOSIS — E7849 Other hyperlipidemia: Secondary | ICD-10-CM | POA: Diagnosis present

## 2024-01-11 DIAGNOSIS — I10 Essential (primary) hypertension: Secondary | ICD-10-CM | POA: Diagnosis not present

## 2024-01-11 DIAGNOSIS — I5032 Chronic diastolic (congestive) heart failure: Secondary | ICD-10-CM | POA: Diagnosis present

## 2024-01-11 DIAGNOSIS — I259 Chronic ischemic heart disease, unspecified: Secondary | ICD-10-CM

## 2024-01-11 LAB — ECHOCARDIOGRAM COMPLETE
AR max vel: 2.33 cm2
AV Area VTI: 2.96 cm2
AV Area mean vel: 2.34 cm2
AV Mean grad: 3 mmHg
AV Peak grad: 6.2 mmHg
Ao pk vel: 1.24 m/s
Area-P 1/2: 5.93 cm2
Calc EF: 58.9 %
Height: 65 in
MV VTI: 3.81 cm2
S' Lateral: 2.5 cm
Single Plane A2C EF: 61.2 %
Single Plane A4C EF: 56.8 %

## 2024-01-11 LAB — TSH: TSH: 0.425 u[IU]/mL (ref 0.350–4.500)

## 2024-01-11 LAB — GLUCOSE, CAPILLARY
Glucose-Capillary: 197 mg/dL — ABNORMAL HIGH (ref 70–99)
Glucose-Capillary: 194 mg/dL — ABNORMAL HIGH (ref 70–99)
Glucose-Capillary: 153 mg/dL — ABNORMAL HIGH (ref 70–99)

## 2024-01-11 LAB — COMPREHENSIVE METABOLIC PANEL WITH GFR
ALT: 15 U/L (ref 0–44)
AST: 22 U/L (ref 15–41)
Albumin: 2.9 g/dL — ABNORMAL LOW (ref 3.5–5.0)
Alkaline Phosphatase: 77 U/L (ref 38–126)
Anion gap: 9 (ref 5–15)
BUN: 8 mg/dL (ref 8–23)
CO2: 31 mmol/L (ref 22–32)
Calcium: 8.3 mg/dL — ABNORMAL LOW (ref 8.9–10.3)
Chloride: 96 mmol/L — ABNORMAL LOW (ref 98–111)
Creatinine, Ser: 0.69 mg/dL (ref 0.61–1.24)
GFR, Estimated: >60 mL/min (ref >60)
Glucose, Bld: 228 mg/dL — ABNORMAL HIGH (ref 70–99)
Potassium: 4.8 mmol/L (ref 3.5–5.1)
Sodium: 136 mmol/L (ref 135–145)
Total Bilirubin: 0.6 mg/dL (ref 0.0–1.2)
Total Protein: 6.7 g/dL (ref 6.5–8.1)

## 2024-01-11 LAB — URINALYSIS, COMPLETE (UACMP) WITH MICROSCOPIC
Bacteria, UA: NONE SEEN
Bilirubin Urine: NEGATIVE
Glucose, UA: >=500 mg/dL — AB
Hgb urine dipstick: NEGATIVE
Ketones, ur: NEGATIVE mg/dL
Leukocytes,Ua: NEGATIVE
Nitrite: NEGATIVE
Protein, ur: NEGATIVE mg/dL
Specific Gravity, Urine: 1.03 (ref 1.005–1.030)
pH: 5 (ref 5.0–8.0)

## 2024-01-11 LAB — RESP PANEL BY RT-PCR (RSV, FLU A&B, COVID)  RVPGX2
Influenza A by PCR: NEGATIVE
Influenza B by PCR: NEGATIVE
Resp Syncytial Virus by PCR: NEGATIVE
SARS Coronavirus 2 by RT PCR: NEGATIVE

## 2024-01-11 LAB — BLOOD GAS, VENOUS
Acid-Base Excess: 6.4 mmol/L — ABNORMAL HIGH (ref 0.0–2.0)
Bicarbonate: 35.2 mmol/L — ABNORMAL HIGH (ref 20.0–28.0)
O2 Saturation: 97.6 %
Patient temperature: 37
pCO2, Ven: 70 mmHg — ABNORMAL HIGH (ref 44–60)
pH, Ven: 7.31 (ref 7.25–7.43)
pO2, Ven: 85 mmHg — ABNORMAL HIGH (ref 32–45)

## 2024-01-11 LAB — HIV ANTIBODY (ROUTINE TESTING W REFLEX): HIV Screen 4th Generation wRfx: NONREACTIVE

## 2024-01-11 LAB — TROPONIN I (HIGH SENSITIVITY)
Troponin I (High Sensitivity): 8 ng/L (ref <18)
Troponin I (High Sensitivity): 7 ng/L (ref <18)

## 2024-01-11 LAB — RESPIRATORY PANEL BY PCR

## 2024-01-11 LAB — CBC
HCT: 40.1 % (ref 39.0–52.0)
Hemoglobin: 11.9 g/dL — ABNORMAL LOW (ref 13.0–17.0)
MCH: 24.9 pg — ABNORMAL LOW (ref 26.0–34.0)
MCHC: 29.7 g/dL — ABNORMAL LOW (ref 30.0–36.0)
MCV: 83.9 fL (ref 80.0–100.0)
Platelets: 312 K/uL (ref 150–400)
RBC: 4.78 MIL/uL (ref 4.22–5.81)
RDW: 17.2 % — ABNORMAL HIGH (ref 11.5–15.5)
WBC: 7.4 K/uL (ref 4.0–10.5)
nRBC: 0 % (ref 0.0–0.2)

## 2024-01-11 LAB — D-DIMER, QUANTITATIVE: D-Dimer, Quant: 0.53 ug{FEU}/mL — ABNORMAL HIGH (ref 0.00–0.50)

## 2024-01-11 LAB — RAPID URINE DRUG SCREEN, HOSP PERFORMED
Amphetamines: NOT DETECTED
Barbiturates: NOT DETECTED
Benzodiazepines: NOT DETECTED
Cocaine: NOT DETECTED
Opiates: NOT DETECTED
Tetrahydrocannabinol: NOT DETECTED

## 2024-01-11 LAB — PROCALCITONIN: Procalcitonin: <0.1 ng/mL

## 2024-01-11 LAB — PREALBUMIN: Prealbumin: 12 mg/dL — ABNORMAL LOW (ref 18–38)

## 2024-01-11 LAB — PHOSPHORUS
Phosphorus: 3.1 mg/dL (ref 2.5–4.6)
Phosphorus: 2.9 mg/dL (ref 2.5–4.6)

## 2024-01-11 LAB — CK: Total CK: 246 U/L (ref 49–397)

## 2024-01-11 LAB — LACTIC ACID, PLASMA
Lactic Acid, Venous: 1.7 mmol/L (ref 0.5–1.9)
Lactic Acid, Venous: 2.9 mmol/L (ref 0.5–1.9)

## 2024-01-11 LAB — MAGNESIUM: Magnesium: 2.3 mg/dL (ref 1.7–2.4)

## 2024-01-11 MED ORDER — GUAIFENESIN 100 MG/5ML PO LIQD
5.0000 mL | ORAL | Status: DC | PRN
  Administered 2024-01-12: 5 mL via ORAL
  Filled 2024-01-11: qty 10

## 2024-01-11 MED ORDER — AMLODIPINE BESYLATE 10 MG PO TABS
10.0000 mg | ORAL_TABLET | Freq: Every day | ORAL | Status: DC
  Administered 2024-01-11 – 2024-01-14 (×4): 10 mg via ORAL
  Filled 2024-01-11 (×4): qty 1

## 2024-01-11 MED ORDER — ADULT MULTIVITAMIN W/MINERALS CH
1.0000 | ORAL_TABLET | Freq: Every day | ORAL | Status: DC
  Administered 2024-01-11 – 2024-01-14 (×4): 1 via ORAL
  Filled 2024-01-11 (×4): qty 1

## 2024-01-11 MED ORDER — ENSURE PLUS HIGH PROTEIN PO LIQD
237.0000 mL | Freq: Two times a day (BID) | ORAL | Status: DC
  Administered 2024-01-12 – 2024-01-14 (×4): 237 mL via ORAL

## 2024-01-11 MED ORDER — ARFORMOTEROL TARTRATE 15 MCG/2ML IN NEBU
15.0000 ug | INHALATION_SOLUTION | Freq: Two times a day (BID) | RESPIRATORY_TRACT | Status: DC
  Administered 2024-01-11 – 2024-01-13 (×6): 15 ug via RESPIRATORY_TRACT
  Filled 2024-01-11 (×7): qty 2

## 2024-01-11 MED ORDER — HYDROXYZINE HCL 25 MG PO TABS
25.0000 mg | ORAL_TABLET | Freq: Every day | ORAL | Status: DC
  Administered 2024-01-11 – 2024-01-13 (×3): 25 mg via ORAL
  Filled 2024-01-11 (×3): qty 1

## 2024-01-11 MED ORDER — PRAZOSIN HCL 1 MG PO CAPS
1.0000 mg | ORAL_CAPSULE | Freq: Every day | ORAL | Status: DC
  Administered 2024-01-11 – 2024-01-13 (×3): 1 mg via ORAL
  Filled 2024-01-11 (×4): qty 1

## 2024-01-11 MED ORDER — OXYCODONE HCL 5 MG PO TABS
20.0000 mg | ORAL_TABLET | Freq: Four times a day (QID) | ORAL | Status: DC | PRN
  Administered 2024-01-12 – 2024-01-13 (×3): 20 mg via ORAL
  Filled 2024-01-11 (×3): qty 4

## 2024-01-11 MED ORDER — BUDESONIDE 0.5 MG/2ML IN SUSP
0.5000 mg | Freq: Two times a day (BID) | RESPIRATORY_TRACT | Status: DC
  Administered 2024-01-11 – 2024-01-13 (×5): 0.5 mg via RESPIRATORY_TRACT
  Filled 2024-01-11 (×6): qty 2

## 2024-01-11 NOTE — Progress Notes (Addendum)
  Pt transferred from 2W to 4E16. He is alert and fully oriented x 4, no obvious acute distress at arrival. Pt is able to ambulate independently in his room to the bathroom. No shortness of breath after ambulation on room air, SPO2 71%. With 4 LPM of NCL, his SPO2 91-95%. Lungs clear bilaterally on auscultations, no wheezing or crackles. RT notified for Pt arrival. MD has ordered for BiPAP. Plan of care is reviewed. We will continue to monitor.   01/11/24 0210 01/11/24 0216  Vitals  Temp 98.1 F (36.7 C)  --   Temp Source Oral  --   BP (!) 143/90  --   MAP (mmHg) 105  --   BP Location Right Arm  --   BP Method Automatic  --   Patient Position (if appropriate) Lying  --   Pulse Rate (!) 106  --   Pulse Rate Source Monitor  --   ECG Heart Rate (!) 108 (!) 106  Resp 20 19  Level of Consciousness  Level of Consciousness Alert Alert  MEWS COLOR  MEWS Score Color Green Green  Oxygen  Therapy  SpO2 (!) 71 % 91-95 %  O2 Device Room Air Nasal Cannula  O2 Flow Rate (L/min)  --  4 L/min  Patient Activity (if Appropriate)  --  In bed  Pulse Oximetry Type  --  Intermittent  Pain Assessment  Pain Scale  --  0-10  Pain Score  --  0    Wendi Dash, RN

## 2024-01-11 NOTE — Assessment & Plan Note (Signed)
 this patient has acute respiratory failure with Hypoxia and  Hypercarbia as documented by the presence of following: O2 saturatio< 90% on RA pH <7.35 with pCO2 >50  Likely due to:   COPD exacerbation, COVID pneumonia Provide O2 therapy and titrate as needed  Continuous pulse ox   check Pulse ox with ambulation prior to discharge    flutter valve ordered Patient is alert and oriented easily arousable Repeat VBG  There is a combination of metabolic acidosis in the setting of elevated lactic acid patient likely has chronic respiratory acidosis which is somewhat well compensated given elevated bicarb Avoid over sedating medications

## 2024-01-11 NOTE — Evaluation (Signed)
 Occupational Therapy Evaluation Patient Details Name: Glenn [name] MRN: [id number] DOB: Aug 04, 1960 Today's Date: 01/11/2024   History of Present Illness   63 y/o M presentign to ED on 12/3 with SOB, SpO2 64% on arrival. Admitted for COPD exacerbation    PMH includes COPD, DM2, HTN, CVA, HLD     Clinical Impressions Pt ind at baseline with ADLs and uses a cane PRN for mobility. Pt overall needs up to min A for ADLs, mod I for bed mobility and CGA for standing/transfers, while holding onto beside table. Pt reports feeling woozy with standing and declines further ambulation, SpO2 WNL on 4L O2 throughout session. Pt presenting with impairments listed below, will follow acutely. Recommend HHOT at d/c pending progression.     If plan is discharge home, recommend the following:   A little help with walking and/or transfers;A little help with bathing/dressing/bathroom;Assistance with cooking/housework;Assist for transportation;Help with stairs or ramp for entrance     Functional Status Assessment   Patient has had a recent decline in their functional status and demonstrates the ability to make significant improvements in function in a reasonable and predictable amount of time.     Equipment Recommendations   Tub/shower seat     Recommendations for Other Services   PT consult     Precautions/Restrictions   Precautions Precautions: Fall Precaution/Restrictions Comments: 2L O2 baseline Restrictions Weight Bearing Restrictions Per Provider Order: No     Mobility Bed Mobility Overal bed mobility: Modified Independent                  Transfers Overall transfer level: Needs assistance Equipment used: None Transfers: Sit to/from Stand Sit to Stand: Contact guard assist           General transfer comment: holds onto bedside table in standing due to feeling woozy declines further ambulation      Balance Overall balance assessment: Needs  assistance Sitting-balance support: Feet supported Sitting balance-Leahy Scale: Good     Standing balance support: During functional activity, Reliant on assistive device for balance Standing balance-Leahy Scale: Fair Standing balance comment: external support                           ADL either performed or assessed with clinical judgement   ADL Overall ADL's : Needs assistance/impaired Eating/Feeding: Independent   Grooming: Independent   Upper Body Bathing: Minimal assistance   Lower Body Bathing: Minimal assistance   Upper Body Dressing : Contact guard assist   Lower Body Dressing: Contact guard assist   Toilet Transfer: Minimal assistance   Toileting- Clothing Manipulation and Hygiene: Minimal assistance       Functional mobility during ADLs: Minimal assistance       Vision   Vision Assessment?: No apparent visual deficits     Perception Perception: Not tested       Praxis Praxis: Not tested       Pertinent Vitals/Pain Pain Assessment Pain Assessment: Faces Pain Score: 2  Faces Pain Scale: Hurts a little bit Pain Location: back pain Pain Descriptors / Indicators: Discomfort Pain Intervention(s): Limited activity within patient's tolerance, Monitored during session, Repositioned     Extremity/Trunk Assessment Upper Extremity Assessment Upper Extremity Assessment: Overall WFL for tasks assessed   Lower Extremity Assessment Lower Extremity Assessment: Defer to PT evaluation   Cervical / Trunk Assessment Cervical / Trunk Assessment: Normal   Communication Communication Communication: No apparent difficulties   Cognition Arousal: Alert Behavior During  Therapy: WFL for tasks assessed/performed Cognition: No family/caregiver present to determine baseline             OT - Cognition Comments: difficutly dual tasking, needs min repetitive cues, pt states he has not been OOB, per RN pt has been independently mobilizing in room                  Following commands: Intact       Cueing  General Comments   Cueing Techniques: Verbal cues  VSS on 4L O2   Exercises     Shoulder Instructions      Home Living Family/patient expects to be discharged to:: Private residence Living Arrangements: Spouse/significant other Available Help at Discharge: Family;Available PRN/intermittently Type of Home: House Home Access: Stairs to enter [employer] of Steps: 6 Entrance Stairs-Rails: Can reach both Home Layout: One level     Bathroom Shower/Tub: Chief Strategy Officer: Standard     Home Equipment: Cane - single point          Prior Functioning/Environment Prior Level of Function : Independent/Modified Independent             Mobility Comments: uses cane PRN. ADLs Comments: ind    OT Problem List: Decreased strength;Decreased range of motion;Decreased activity tolerance;Impaired balance (sitting and/or standing);Decreased cognition;Decreased safety awareness   OT Treatment/Interventions: Self-care/ADL training;Therapeutic exercise;Energy conservation;DME and/or AE instruction;Cognitive remediation/compensation;Therapeutic activities;Patient/family education;Balance training      OT Goals(Current goals can be found in the care plan section)   Acute Rehab OT Goals Patient Stated Goal: none stated OT Goal Formulation: With patient Time For Goal Achievement: 01/25/24 Potential to Achieve Goals: Good ADL Goals Pt Will Perform Grooming: with modified independence;standing Pt Will Perform Upper Body Dressing: with modified independence;standing Pt Will Perform Lower Body Dressing: with modified independence;sit to/from stand;sitting/lateral leans Pt Will Transfer to Toilet: with modified independence;ambulating;regular height toilet Pt Will Perform Tub/Shower Transfer: Tub transfer;Shower transfer;with modified independence;ambulating   OT Frequency:  Min 2X/week     Co-evaluation              AM-PAC OT 6 Clicks Daily Activity     Outcome Measure Help from another person eating meals?: None Help from another person taking care of personal grooming?: A Little Help from another person toileting, which includes using toliet, bedpan, or urinal?: A Little Help from another person bathing (including washing, rinsing, drying)?: A Little Help from another person to put on and taking off regular upper body clothing?: A Little Help from another person to put on and taking off regular lower body clothing?: A Little 6 Click Score: 19   End of Session Equipment Utilized During Treatment: Oxygen  Nurse Communication: Mobility status  Activity Tolerance: Patient tolerated treatment well Patient left: in bed;with call bell/phone within reach;with bed alarm set  OT Visit Diagnosis: Unsteadiness on feet (R26.81);Other abnormalities of gait and mobility (R26.89);Muscle weakness (generalized) (M62.81)                Time: 9045-8989 OT Time Calculation (min): 16 min Charges:  OT General Charges $OT Visit: 1 Visit OT Evaluation $OT Eval Moderate Complexity: 1 Mod  Emmerie Battaglia K, OTD, OTR/L SecureChat Preferred Acute Rehab [phone]   Laneta POUR Koonce 01/11/2024, 12:37 PM

## 2024-01-11 NOTE — Inpatient Diabetes Management (Signed)
 Inpatient Diabetes Program Recommendations  AACE/ADA: New Consensus Statement on Inpatient Glycemic Control (2015)  Target Ranges:  Prepandial:   less than 140 mg/dL      Peak postprandial:   less than 180 mg/dL (1-2 hours)      Critically ill patients:  140 - 180 mg/dL   Lab Results  Component Value Date   GLUCAP 194 (H) 01/11/2024   HGBA1C 7.6 (H) 08/26/2023    Review of Glycemic Control  Latest Reference Range & Units 01/11/24 06:00 01/11/24 12:44  Glucose-Capillary 70 - 99 mg/dL 802 (H) 805 (H)   Diabetes history: DM 2 Outpatient Diabetes medications:  Metformin  500 mg bid Current orders for Inpatient glycemic control:  Prednisone  40 mg daily (x4 days)  Inpatient Diabetes Program Recommendations:    May consider adding Novolog  0-9 units tid with meals.   Thanks,  Randall Bullocks, RN, BC-ADM Inpatient Diabetes Coordinator Pager [phone]  (8a-5p)

## 2024-01-11 NOTE — Progress Notes (Signed)
  Echocardiogram 2D Echocardiogram has been performed.  Glenn [name] ORN [facility] 01/11/2024, 11:29 AM

## 2024-01-11 NOTE — Progress Notes (Signed)
 PROGRESS NOTE  Glenn [name] FMW:[id number] DOB: 10/09/1960 DOA: 01/10/2024 PCP: [facility]   LOS: 0 days   Brief Narrative / Interim history: 63 year old male with history of COPD, DM 2, HTN, hyperlipidemia comes into the hospital with shortness of breath, chest tightness.  He is on 2 L at home.  Patient basically tells me that he has had multiple hospitalizations this year, and he feels like he really does not have a good baseline anymore.  Symptoms have gotten much worse over the last couple of months after a trip in [place].  He states that he he gets very short of breath, wheezy, gets hospitalized, feels better but has found himself more more limited to how much he can walk.  He reports also worsening substernal chest pain, pressure-like, every time he tries to get up and move around.  He does not know any cardiac history that he is aware of, but his chest pain is fairly predictable, happens with ambulation, he becomes short winded and upon stopping his chest pain goes away.  Subjective / 24h Interval events: He is feeling well at rest, but really he is up and ambulating even in the room he gets pretty short winded  Assesement and Plan: Principal problem Acute on chronic hypoxic respiratory failure, underlying COPD with possible exacerbation -patient has no wheezing on my exam, but has had wheezing on admission.  He was started on nebulizers, steroids, azithromycin .  Continue - VBG shows compensated hypercarbia - He quit smoking in 2022  Active problems Chest pain -pretty much with every activity, raising concern for a component of angina.  He does have poor respiratory status at baseline but chest pain has some typical characteristics such as getting worse with activity and resolving with Sublingual nitroglycerin  and rest - Cardiology consulted, appreciate input, he does have significant risk factors - Obtain a 2D echocardiogram  History of CVA-continue aspirin , he  apparently is not on Plavix  anymore  Chronic pain-continue Dimon and short acting home pain regimen   Scheduled Meds:  aspirin  EC  81 mg Oral Daily   azithromycin   500 mg Oral Daily   budesonide  (PULMICORT ) nebulizer solution  2 mg Nebulization Q12H   feeding supplement  237 mL Oral BID BM   ipratropium-albuterol   3 mL Nebulization Q6H   montelukast   10 mg Oral QHS   multivitamin with minerals  1 tablet Oral Daily   oxyCODONE   10 mg Oral Q12H   pantoprazole   40 mg Oral Daily   pravastatin   10 mg Oral QHS   [START ON 01/12/2024] predniSONE   40 mg Oral Q breakfast   sodium chloride  flush  3 mL Intravenous Q12H   tamsulosin   0.4 mg Oral Daily   Continuous Infusions:  sodium chloride  100 mL/hr at 01/11/24 0204   PRN Meds:.albuterol , fentaNYL  (SUBLIMAZE ) injection, guaiFENesin , ondansetron  **OR** ondansetron  (ZOFRAN ) IV, oxyCODONE , senna-docusate  Current Outpatient Medications  Medication Instructions   albuterol  (VENTOLIN  HFA) 108 (90 Base) MCG/ACT inhaler 1-2 puffs, Inhalation, Every 6 hours PRN   amLODipine  (NORVASC ) 10 mg, Oral, Daily   aspirin  EC 81 mg, Daily   budesonide -formoterol  (SYMBICORT ) 160-4.5 MCG/ACT inhaler 2 puffs, Inhalation, 2 times daily   clopidogrel  (PLAVIX ) 75 mg, Oral, Daily   docusate sodium  (COLACE) 100 mg, 2 times daily PRN   doxycycline  (VIBRAMYCIN ) 100 mg, Oral, 2 times daily   EPINEPHrine  (EPI-PEN) 0.3 mg, As needed   famotidine  (PEPCID ) 20 MG tablet One after supper   ferrous sulfate  325 mg, Every  M-W-F   fluticasone  (FLONASE ) 50 MCG/ACT nasal spray Place 2 sprays into both nostrils daily for 10 days.   guaiFENesin  (MUCINEX ) 600 mg, Every morning   hydrOXYzine  (ATARAX ) 25 mg, Daily at bedtime   ipratropium-albuterol  (DUONEB) 0.5-2.5 (3) MG/3ML SOLN Take 3 mLs by nebulization 3 (three) times daily for 5 days, THEN 3 mLs every 6 (six) hours as needed for up to 5 days.   loratadine  (CLARITIN ) 10 mg, Oral, Daily   metFORMIN  (GLUCOPHAGE ) 500 mg, Oral, 2  times daily with meals   montelukast  (SINGULAIR ) 10 mg, Daily at bedtime   naloxone  (NARCAN ) nasal spray 4 mg/0.1 mL 1 spray, As needed   oxyCODONE  (OXYCONTIN ) 10 mg, Every 12 hours   Oxycodone  HCl 20 mg, Every 6 hours PRN   OXYGEN  2-3 L/min, As needed   pantoprazole  (PROTONIX ) 40 mg, Oral, Daily   polyethylene glycol (MIRALAX  / GLYCOLAX ) 17 g, Oral, Daily   pravastatin  (PRAVACHOL ) 10 mg, Daily at bedtime   prazosin  (MINIPRESS ) 1 mg, Daily at bedtime   predniSONE  (DELTASONE ) 50 MG tablet One tablet a day for 5 days   promethazine -dextromethorphan  (PROMETHAZINE -DM) 6.25-15 MG/5ML syrup 5 mLs, Oral, 4 times daily PRN   senna-docusate (SENOKOT-S) 8.6-50 MG tablet 1 tablet, Oral, 2 times daily   sildenafil  (VIAGRA ) 100 mg, Oral, Daily PRN   tamsulosin  (FLOMAX ) 0.4 mg, Oral, Daily   Tiotropium Bromide  Monohydrate 2.5 MCG/ACT AERS 2 puffs, Inhalation, Daily   vitamin C (ASCORBIC ACID ) 250 mg, Daily   Vitamin D3 4,000 Units, Daily    Diet Orders (From admission, onward)     Start     Ordered   01/11/24 0947  Diet Carb Modified Fluid consistency: Thin  Diet effective now       Question Answer Comment  Diet-HS Snack? Snack-yes   Calorie Level Medium 1600-2000   Fluid consistency: Thin      01/11/24 0946            DVT prophylaxis: SCDs Start: 01/10/24 2354   Lab Results  Component Value Date   PLT 312 01/11/2024      Code Status: Full Code  Family Communication: No family at bedside  Status is: Inpatient Remains inpatient appropriate because: Severity of illness   Level of care: Progressive  Consultants:  Cardiology  Objective: Vitals:   01/11/24 0252 01/11/24 0253 01/11/24 0325 01/11/24 0956  BP:   132/80 135/85  Pulse:  96 (!) 102 96  Resp:   20 20  Temp:   97.8 F (36.6 C) 97.6 F (36.4 C)  TempSrc:   Oral Oral  SpO2: 92% 92% 96% 97%    Intake/Output Summary (Last 24 hours) at 01/11/2024 1014 Last data filed at 01/11/2024 0800 Gross per 24 hour   Intake 1136.87 ml  Output 180 ml  Net 956.87 ml   Wt Readings from Last 3 Encounters:  12/11/23 65.8 kg  11/09/23 65.1 kg  10/24/23 65.7 kg    Examination:  Constitutional: NAD Eyes: no scleral icterus ENMT: Mucous membranes are moist.  Neck: normal, supple Respiratory: clear to auscultation bilaterally, no wheezing, no crackles. Normal respiratory effort.  Cardiovascular: Regular rate and rhythm, no murmurs / rubs / gallops. No LE edema.  Abdomen: non distended, no tenderness. Bowel sounds positive.  Musculoskeletal: no clubbing / cyanosis.   Data Reviewed: I have independently reviewed following labs and imaging studies   CBC Recent Labs  Lab 01/10/24 1320 01/10/24 1331 01/11/24 0405  WBC 11.6*  --  7.4  HGB  12.7* 15.3 11.9*  HCT 41.8 45.0 40.1  PLT 327  --  312  MCV 83.9  --  83.9  MCH 25.5*  --  24.9*  MCHC 30.4  --  29.7*  RDW 17.7*  --  17.2*  LYMPHSABS 2.3  --   --   MONOABS 0.8  --   --   EOSABS 0.5  --   --   BASOSABS 0.1  --   --     Recent Labs  Lab 01/10/24 1320 01/10/24 1331 01/10/24 2324 01/10/24 2346 01/11/24 0405  NA 139 138  --   --  136  K 4.1 3.9  --   --  4.8  CL 98  --   --   --  96*  CO2 33*  --   --   --  31  GLUCOSE 90  --   --   --  228*  BUN 11  --   --   --  8  CREATININE 0.66  --   --   --  0.69  CALCIUM  9.8  --   --   --  8.3*  AST 21  --   --   --  22  ALT 13  --   --   --  15  ALKPHOS 97  --   --   --  77  BILITOT 0.3  --   --   --  0.6  ALBUMIN 4.0  --   --   --  2.9*  MG 2.1  --   --   --  2.3  PROCALCITON  --   --  <0.10  --   --   LATICACIDVEN  --   --  2.8* 2.9* 1.7  TSH  --   --  0.425  --   --     ------------------------------------------------------------------------------------------------------------------ No results for input(s): CHOL, HDL, LDLCALC, TRIG, CHOLHDL, LDLDIRECT in the last 72 hours.  Lab Results  Component Value Date   HGBA1C 7.6 (H) 08/26/2023    ------------------------------------------------------------------------------------------------------------------ Recent Labs    01/10/24 2324  TSH 0.425    Cardiac Enzymes No results for input(s): CKMB, TROPONINI, MYOGLOBIN in the last 168 hours.  Invalid input(s): CK ------------------------------------------------------------------------------------------------------------------    Component Value Date/Time   BNP 76.1 07/20/2023 0959    CBG: Recent Labs  Lab 01/11/24 0600  GLUCAP 197*    Recent Results (from the past 240 hours)  Respiratory (~20 pathogens) panel by PCR     Status: None   Collection Time: 01/10/24 10:48 PM   Specimen: Nasopharyngeal Swab; Respiratory  Result Value Ref Range Status   Adenovirus NOT DETECTED NOT DETECTED Final   Coronavirus 229E NOT DETECTED NOT DETECTED Final    Comment: (NOTE) The Coronavirus on the Respiratory Panel, DOES NOT test for the novel  Coronavirus (2019 nCoV)    Coronavirus HKU1 NOT DETECTED NOT DETECTED Final   Coronavirus NL63 NOT DETECTED NOT DETECTED Final   Coronavirus OC43 NOT DETECTED NOT DETECTED Final   Metapneumovirus NOT DETECTED NOT DETECTED Final   Rhinovirus / Enterovirus NOT DETECTED NOT DETECTED Final   Influenza A NOT DETECTED NOT DETECTED Final   Influenza B NOT DETECTED NOT DETECTED Final   Parainfluenza Virus 1 NOT DETECTED NOT DETECTED Final   Parainfluenza Virus 2 NOT DETECTED NOT DETECTED Final   Parainfluenza Virus 3 NOT DETECTED NOT DETECTED Final   Parainfluenza Virus 4 NOT DETECTED NOT DETECTED Final   Respiratory Syncytial Virus NOT DETECTED NOT  DETECTED Final   Bordetella pertussis NOT DETECTED NOT DETECTED Final   Bordetella Parapertussis NOT DETECTED NOT DETECTED Final   Chlamydophila pneumoniae NOT DETECTED NOT DETECTED Final   Mycoplasma pneumoniae NOT DETECTED NOT DETECTED Final    Comment: Performed at [facility], [address] [place], [place] [address]  Resp  panel by RT-PCR (RSV, Flu A&B, Covid) Anterior Nasal Swab     Status: None   Collection Time: 01/10/24 10:48 PM   Specimen: Anterior Nasal Swab  Result Value Ref Range Status   SARS Coronavirus 2 by RT PCR NEGATIVE NEGATIVE Final   Influenza A by PCR NEGATIVE NEGATIVE Final   Influenza B by PCR NEGATIVE NEGATIVE Final    Comment: (NOTE) The Xpert Xpress SARS-CoV-2/FLU/RSV plus assay is intended as an aid in the diagnosis of influenza from Nasopharyngeal swab specimens and should not be used as a sole basis for treatment. Nasal washings and aspirates are unacceptable for Xpert Xpress SARS-CoV-2/FLU/RSV testing.  Fact Sheet for Patients: bloggercourse.com  Fact Sheet for Healthcare Providers: seriousbroker.it  This test is not yet approved or cleared by the [place]  FDA and has been authorized for detection and/or diagnosis of SARS-CoV-2 by FDA under an Emergency Use Authorization (EUA). This EUA will remain in effect (meaning this test can be used) for the duration of the COVID-19 declaration under Section 564(b)(1) of the Act, 21 U.S.C. section 360bbb-3(b)(1), unless the authorization is terminated or revoked.     Resp Syncytial Virus by PCR NEGATIVE NEGATIVE Final    Comment: (NOTE) Fact Sheet for Patients: bloggercourse.com  Fact Sheet for Healthcare Providers: seriousbroker.it  This test is not yet approved or cleared by the [place]  FDA and has been authorized for detection and/or diagnosis of SARS-CoV-2 by FDA under an Emergency Use Authorization (EUA). This EUA will remain in effect (meaning this test can be used) for the duration of the COVID-19 declaration under Section 564(b)(1) of the Act, 21 U.S.C. section 360bbb-3(b)(1), unless the authorization is terminated or revoked.  Performed at [facility], [address] [place], [place] [address]       Radiology Studies: CT Angio Chest PE W and/or Wo Contrast Result Date: 01/10/2024 EXAM: CTA of the Chest with contrast for PE 01/10/2024 04:03:09 PM TECHNIQUE: CTA of the chest was performed after the administration of 75 mL of intravenous iohexol  (OMNIPAQUE ) 350 MG/ML injection 100 mL IOHEXOL  350 MG/ML SOLN. Multiplanar reformatted images are provided for review. MIP images are provided for review. Automated exposure control, iterative reconstruction, and/or weight based adjustment of the mA/kV was utilized to reduce the radiation dose to as low as reasonably achievable. COMPARISON: None available. CLINICAL HISTORY: Pulmonary embolism (PE) suspected, high prob. FINDINGS: PULMONARY ARTERIES: Pulmonary arteries are adequately opacified for evaluation. No pulmonary embolism. Main pulmonary artery is normal in caliber. MEDIASTINUM: The heart and pericardium demonstrate no acute abnormality. There is no acute abnormality of the thoracic aorta. LYMPH NODES: No mediastinal, hilar or axillary lymphadenopathy. LUNGS AND PLEURA: Elevated left hemidiaphragm stable. Probable bilateral lung scarring is noted. Emphysematous disease is noted predominantly in the upper lobes. Minimal left basilar subsegmental atelectasis may be present. No focal consolidation or pulmonary edema. No pleural effusion or pneumothorax. UPPER ABDOMEN: Limited images of the upper abdomen are unremarkable. SOFT TISSUES AND BONES: No acute bone or soft tissue abnormality. IMPRESSION: 1. No pulmonary embolism. 2. Stable elevated left hemidiaphragm. 3. Probable bilateral lung scarring and emphysematous disease, predominantly in the upper lobes. 4. Given  emphysema and typical screening age, consider evaluation for a low-dose CT lung cancer screening program. Electronically signed by: Lynwood Seip MD 01/10/2024 04:50 PM EST RP Workstation: [id number]     Nilda Fendt, MD, PhD [facility]  Between 7 am - 7 pm I am available, please contact  me via Amion (for emergencies) or Securechat (non urgent messages)  Between 7 pm - 7 am I am not available, please contact night coverage MD/APP via Amion

## 2024-01-11 NOTE — Evaluation (Signed)
 Physical Therapy Evaluation Patient Details Name: Glenn [name] MRN: [id number] DOB: 1960-08-08 Today's Date: 01/11/2024  History of Present Illness  63 y/o M presenting to ED on 12/3 with SOB, SpO2 64% on arrival. Admitted for COPD exacerbation    PMH includes COPD, DM2, HTN, CVA, HLD  Clinical Impression  Pt admitted with/for SOB with COPD exacerbation.  Today's session was limited to pt moving around the L side of the bed.  He was safe at a S level, but deferred any further mobility away from the bedside.  Pt currently limited functionally due to the problems listed below.  (see problems list.)  Pt will benefit from PT to maximize function and safety to be able to get home safely with available assist.         If plan is discharge home, recommend the following: A little help with walking and/or transfers;A little help with bathing/dressing/bathroom;Assistance with cooking/housework;Assist for transportation   Can travel by private vehicle        Equipment Recommendations None recommended by PT  Recommendations for Other Services       Functional Status Assessment Patient has had a recent decline in their functional status and demonstrates the ability to make significant improvements in function in a reasonable and predictable amount of time.     Precautions / Restrictions Precautions Precautions: Fall Precaution/Restrictions Comments: 2L O2 baseline Restrictions Weight Bearing Restrictions Per Provider Order: No      Mobility  Bed Mobility Overal bed mobility: Modified Independent                  Transfers Overall transfer level: Needs assistance Equipment used: None Transfers: Sit to/from Stand Sit to Stand: Supervision           General transfer comment: pt up in the room on arrival, putting stuff on his bedside table.  Turned around in place to address me. Then played off being up and sat down with poor control    Ambulation/Gait                General Gait Details: pt refused ambulation stating that the medicine was making him delirious and he would have to wait until tomorrow.  Therapist asked pt not to get up without assist if he was too delirious to work further with therapy.  Stairs            Wheelchair Mobility     Tilt Bed    Modified Rankin (Stroke Patients Only)       Balance Overall balance assessment: Needs assistance Sitting-balance support: Feet supported Sitting balance-Leahy Scale: Good     Standing balance support: During functional activity, Reliant on assistive device for balance Standing balance-Leahy Scale: Fair Standing balance comment: no external support needed, but pt was mildly unsteady at EOB and during 180 degrees turn around.                             Pertinent Vitals/Pain Pain Assessment Pain Assessment: Faces Faces Pain Scale: Hurts a little bit Pain Location: back pain Pain Descriptors / Indicators: Discomfort Pain Intervention(s): Monitored during session    Home Living Family/patient expects to be discharged to:: Private residence Living Arrangements: Spouse/significant other Available Help at Discharge: Family;Available PRN/intermittently Type of Home: House Home Access: Stairs to enter Entrance Stairs-Rails: Can reach both Entrance Stairs-Number of Steps: 6   Home Layout: One level Home Equipment: Cane - single point  Prior Function Prior Level of Function : Independent/Modified Independent             Mobility Comments: uses cane PRN. ADLs Comments: ind     Extremity/Trunk Assessment   Upper Extremity Assessment Upper Extremity Assessment: Overall WFL for tasks assessed    Lower Extremity Assessment Lower Extremity Assessment: Overall WFL for tasks assessed (mild general weakness)    Cervical / Trunk Assessment Cervical / Trunk Assessment: Normal  Communication   Communication Communication: No apparent difficulties     Cognition Arousal: Alert Behavior During Therapy: WFL for tasks assessed/performed                             Following commands: Intact       Cueing Cueing Techniques: Verbal cues     General Comments General comments (skin integrity, edema, etc.): did not get a reading in the short time in room.  Display was dark, telebox not checked.    Exercises     Assessment/Plan    PT Assessment Patient needs continued PT services  PT Problem List Decreased strength;Decreased balance;Decreased mobility;Decreased knowledge of use of DME;Cardiopulmonary status limiting activity;Pain       PT Treatment Interventions Gait training;Stair training;Functional mobility training;Therapeutic activities;Balance training;Patient/family education;DME instruction    PT Goals (Current goals can be found in the Care Plan section)  Acute Rehab PT Goals Patient Stated Goal: not stated PT Goal Formulation: Patient unable to participate in goal setting Potential to Achieve Goals: Good    Frequency Min 3X/week     Co-evaluation               AM-PAC PT 6 Clicks Mobility  Outcome Measure Help needed turning from your back to your side while in a flat bed without using bedrails?: A Little Help needed moving from lying on your back to sitting on the side of a flat bed without using bedrails?: A Little Help needed moving to and from a bed to a chair (including a wheelchair)?: A Little Help needed standing up from a chair using your arms (e.g., wheelchair or bedside chair)?: A Little Help needed to walk in hospital room?: A Little Help needed climbing 3-5 steps with a railing? : A Little 6 Click Score: 18    End of Session Equipment Utilized During Treatment: Oxygen  Activity Tolerance: Patient limited by fatigue Patient left: in bed;with call bell/phone within reach Nurse Communication: Mobility status PT Visit Diagnosis: Difficulty in walking, not elsewhere classified (R26.2)     Time: 8282-8271 PT Time Calculation (min) (ACUTE ONLY): 11 min   Charges:   PT Evaluation $PT Eval Moderate Complexity: 1 Mod   PT General Charges $$ ACUTE PT VISIT: 1 Visit         01/11/2024  India HERO., PT Acute Rehabilitation Services [phone]  (office)  Vinie GAILS Kinnick Maus 01/11/2024, 5:41 PM

## 2024-01-11 NOTE — Progress Notes (Signed)
 Orders for outpatient cardiac CT. has follow-up appointment scheduled on 02/15/2023.

## 2024-01-11 NOTE — Consult Note (Signed)
 Cardiology Consultation   Patient ID: Glenn [name] MRN: [id number]; DOB: [birth year]  Admit date: 01/10/2024 Date of Consult: 01/11/2024  PCP:  Clinic, [facility] Providers Cardiologist:  None        Patient Profile: Glenn [name] is a 63 y.o. male with a hx of hypertension, hyperlipidemia, COPD, sarcoidosis, coronary calcifications, history of CVA, and type 2 diabetes who is being seen 01/11/2024 for the evaluation of chest pain at the request of Costin Gherghe.  History of Present Illness: Glenn [name] is a 63 year old male with prior cardiac history listed below.  The patient was seen by Dr. Pietro on 04/2021 for chest pain and preop evaluation.  The patient's chest pain was felt to be atypical at that time.  Because of this Dr. Pietro ordered a nuclear stress test.  The nuclear stress test was done on 05/2021 and showed no evidence of ischemia, normal LV perfusion, and LVEF was felt to be normal but there were some concerns that it might be low.  A TTE was done on 11/2021 and showed a normal LVEF of 55 to 60%, no RWMA, G1 DD, normal RV systolic function, and grossly normal valve function.  Pulmonary CTA on 08/2023 found coronary artery calcifications.  The patient was hospitalized for worsening shortness of breath and chest pain.  On interview patient reported that he came to the hospital for cough and shortness of breath for 1 month.  Also reported that he has had a chest pain that is worse with exertion.  Reported that nitroglycerin , nebulizers, and oxygen  helped his chest pain.  Denies any ongoing chest pain. Reported that he has been wearing 2 L nasal cannula at home for the last 4 months.  He has had worsening dyspnea on exertion in the last 4 months.  He cannot walk around stores without getting short of breath.  Denies any nausea, vomiting, diaphoresis, lower extremity edema, orthopnea, melena, hematuria, or hematochezia.  Denies ongoing nicotine  use,  alcohol use, cannabis use or illicit substance use.  Reported that he quit smoking about 4 years ago.     Labs showed high-sensitivity troponins negative x 2, elevated D-dimer of 0.53, potassium of 4.8, glucose of 228, creatinine of 0.69, magnesium  of 2.3, lactic acid of 2.8 > 2.9 > 1.7, WBC count of 7.4, and hemoglobin of 11.9.  Because the D-dimer was elevated a pulmonary CTA was done and showed no PE, bilateral lung scarring, and emphysema.  TTE this hospitalization showed a normal LVEF of 55 to 60%, no RWMA, G2 DD, normal RV systolic function, moderately elevated pulmonary artery systolic pressure, normal valve function, and IVC with greater than 50% respiratory variability.  EKG showed normal sinus rhythm with a rate of 91, LVH, and prolonged QTc with a QTc B of 467.  Past Medical History:  Diagnosis Date   Anxiety    Arthritis    Asthma    Chronic pain 05/06/2020   COPD (chronic obstructive pulmonary disease) (HCC)    CVA (cerebral vascular accident) (HCC)    2010; 06/2020   Depression    Diabetes mellitus type 2 in non[facility])    Dyspnea    ED (erectile dysfunction)    Hyperlipidemia    Hypertension    Sarcoid    Tobacco dependence 05/06/2020    Past Surgical History:  Procedure Laterality Date   LUNG SURGERY     removed tissue     Home Medications:  Prior to Admission medications  Medication Sig Start Date End Date Taking? Authorizing Provider  albuterol  (VENTOLIN  HFA) 108 (90 Base) MCG/ACT inhaler Inhale 1-2 puffs into the lungs every 6 (six) hours as needed for wheezing or shortness of breath. Patient taking differently: Inhale 2 puffs into the lungs every 6 (six) hours as needed for shortness of breath. 08/29/23  Yes Sebastian Toribio GAILS, MD  amLODipine  (NORVASC ) 10 MG tablet Take 1 tablet (10 mg total) by mouth daily. 10/03/21  Yes Rai, Ripudeep K, MD  aspirin  EC 81 MG tablet Take 81 mg by mouth daily. Swallow whole.   Yes [provider]   budesonide -formoterol  (SYMBICORT ) 160-4.5 MCG/ACT inhaler Inhale 2 puffs into the lungs 2 (two) times daily. 08/29/23  Yes Sebastian Toribio GAILS, MD  Cholecalciferol (VITAMIN D3) 50 MCG (2000 UT) CHEW Chew 4,000 Units by mouth daily.   Yes [provider]  docusate sodium  (COLACE) 100 MG capsule Take 100 mg by mouth 2 (two) times daily as needed for mild constipation.   Yes [provider]  doxycycline  (VIBRAMYCIN ) 100 MG capsule Take 1 capsule (100 mg total) by mouth 2 (two) times daily. 12/11/23  Yes Sofia, Leslie K, PA-C  famotidine  (PEPCID ) 20 MG tablet One after supper Patient taking differently: Take 20 mg by mouth daily as needed for heartburn or indigestion. 11/22/21  Yes Darlean Ozell NOVAK, MD  ferrous sulfate  325 (65 FE) MG tablet Take 325 mg by mouth every Monday, Wednesday, and Friday.   Yes [provider]  fluticasone  (FLONASE ) 50 MCG/ACT nasal spray Place 2 sprays into both nostrils daily for 10 days. Patient taking differently: Place 2 sprays into both nostrils daily as needed for allergies or rhinitis (or seasonal allergies). 08/30/23  Yes Sebastian Toribio GAILS, MD  guaiFENesin  (MUCINEX ) 600 MG 12 hr tablet Take 600 mg by mouth in the morning.   Yes [provider]  hydrOXYzine  (ATARAX ) 25 MG tablet Take 25 mg by mouth at bedtime. 08/08/23  Yes [provider]  metFORMIN  (GLUCOPHAGE ) 500 MG tablet Take 1 tablet (500 mg total) by mouth 2 (two) times daily with a meal. Patient taking differently: Take 500 mg by mouth 2 (two) times daily before a meal. 10/03/21  Yes Rai, Ripudeep K, MD  montelukast  (SINGULAIR ) 10 MG tablet Take 10 mg by mouth at bedtime.   Yes [provider]  oxyCODONE  (OXYCONTIN ) 10 mg 12 hr tablet Take 10 mg by mouth every 12 (twelve) hours.   Yes [provider]  Oxycodone  HCl 20 MG TABS Take 20 mg by mouth every 6 (six) hours as needed (pain).   Yes [provider]  OXYGEN  Inhale 2-3 L/min into the lungs as  needed (for shortness of breath when exerted).   Yes [provider]  pantoprazole  (PROTONIX ) 40 MG tablet Take 1 tablet (40 mg total) by mouth daily. 07/02/19  Yes Elgergawy, Brayton RAMAN, MD  polyethylene glycol (MIRALAX  / GLYCOLAX ) 17 g packet Take 17 g by mouth daily. Patient taking differently: Take 17 g by mouth daily as needed for moderate constipation. 08/30/23  Yes Sebastian Toribio GAILS, MD  pravastatin (PRAVACHOL) 10 MG tablet Take 10 mg by mouth at bedtime.   Yes [provider]  prazosin  (MINIPRESS ) 1 MG capsule Take 1 mg by mouth at bedtime.   Yes [provider]  senna-docusate (SENOKOT-S) 8.6-50 MG tablet Take 1 tablet by mouth 2 (two) times daily. Patient taking differently: Take 1 tablet by mouth 2 (two) times daily as needed for mild constipation  or moderate constipation. 08/29/23  Yes Sebastian Toribio GAILS, MD  sildenafil  (VIAGRA ) 100 MG tablet Take 1 tablet (100 mg total) by mouth daily as needed. Patient taking differently: Take 100 mg by mouth daily as needed for erectile dysfunction. 09/28/23  Yes   tamsulosin  (FLOMAX ) 0.4 MG CAPS capsule Take 1 capsule (0.4 mg total) by mouth daily. 08/29/23  Yes Sebastian Toribio GAILS, MD  Tiotropium Bromide  Monohydrate 2.5 MCG/ACT AERS Inhale 2 puffs into the lungs daily.   Yes [provider]  vitamin C (ASCORBIC ACID ) 250 MG tablet Take 250 mg by mouth daily.   Yes [provider]  clopidogrel  (PLAVIX ) 75 MG tablet Take 1 tablet (75 mg total) by mouth daily. Patient not taking: Reported on 10/24/2023 08/30/23   Sebastian Toribio GAILS, MD  EPINEPHrine  0.3 mg/0.3 mL IJ SOAJ injection Inject 0.3 mg into the muscle as needed for anaphylaxis. 08/08/23   [provider]  ipratropium-albuterol  (DUONEB) 0.5-2.5 (3) MG/3ML SOLN Take 3 mLs by nebulization 3 (three) times daily for 5 days, THEN 3 mLs every 6 (six) hours as needed for up to 5 days. Patient taking differently: Nebulize 3 ml's and inhale into the lungs 4-5  times a day 08/29/23 11/09/23  Sebastian Toribio GAILS, MD  loratadine  (CLARITIN ) 10 MG tablet Take 1 tablet (10 mg total) by mouth daily. Patient not taking: Reported on 10/25/2023 01/03/22     naloxone  (NARCAN ) nasal spray 4 mg/0.1 mL Place 1 spray into the nose as needed (opioid overdose - respiratory distress). 08/20/21   [provider]  predniSONE  (DELTASONE ) 50 MG tablet One tablet a day for 5 days Patient not taking: Reported on 01/10/2024 12/11/23   Flint Sonny POUR, PA-C  promethazine -dextromethorphan  (PROMETHAZINE -DM) 6.25-15 MG/5ML syrup Take 5 mLs by mouth 4 (four) times daily as needed for cough. Patient not taking: Reported on 01/10/2024 12/11/23   Sofia, Leslie K, PA-C    Scheduled Meds:  amLODipine   10 mg Oral Daily   arformoterol   15 mcg Nebulization BID   aspirin  EC  81 mg Oral Daily   azithromycin   500 mg Oral Daily   budesonide  (PULMICORT ) nebulizer solution  2 mg Nebulization Q12H   feeding supplement  237 mL Oral BID BM   hydrOXYzine   25 mg Oral QHS   ipratropium-albuterol   3 mL Nebulization Q6H   montelukast   10 mg Oral QHS   multivitamin with minerals  1 tablet Oral Daily   oxyCODONE   10 mg Oral Q12H   pantoprazole   40 mg Oral Daily   pravastatin  10 mg Oral QHS   prazosin   1 mg Oral QHS   [START ON 01/12/2024] predniSONE   40 mg Oral Q breakfast   sodium chloride  flush  3 mL Intravenous Q12H   tamsulosin   0.4 mg Oral Daily   Continuous Infusions:  PRN Meds: albuterol , fentaNYL  (SUBLIMAZE ) injection, guaiFENesin , ondansetron  **OR** ondansetron  (ZOFRAN ) IV, oxyCODONE , senna-docusate  Allergies:    Allergies  Allergen Reactions   Insulins Swelling and Other (See Comments)    04/2020. Do not use Novolog  insulin . Swelling of tongue   Porcine (Pork) Protein-Containing Drug Products Nausea And Vomiting and Other (See Comments)    Cultural-Muslim and NOT TOLERATED, AS WELL   Acetaminophen  Nausea Only and Other (See Comments)    Abdominal sensitivity and Abdominal  discomfort   Ibuprofen Other (See Comments)    Abdominal sensitivity/Abdominal discomfort   Lisinopril Swelling, Rash and Other (See Comments)    Tongue swelling   Shrimp [Shellfish Allergy ]  Itching   Insulin  Aspart Swelling and Other (See Comments)    Tongue swelling   Amoxicillin -Pot Clavulanate Nausea And Vomiting    Social History:   Social History   Socioeconomic History   Marital status: Married    Spouse name: Not on file   Number of children: Not on file   Years of education: Not on file   Highest education level: Some college, no degree  Occupational History   Occupation: disabled  Tobacco Use   Smoking status: Former    Current packs/day: 0.00    Average packs/day: 1 pack/day for 34.0 years (34.0 ttl pk-yrs)    Types: Cigarettes    Start date: 05/12/1986    Quit date: 05/11/2020    Years since quitting: 3.6    Passive exposure: Never   Smokeless tobacco: Never   Tobacco comments:    at the most smoked 6-7 cigarettes/day  Vaping Use   Vaping status: Never Used  Substance and Sexual Activity   Alcohol use: Never   Drug use: Never   Sexual activity: Not on file  Other Topics Concern   Not on file  Social History Narrative   Lives with wife   Social Drivers of Health   Financial Resource Strain: Not on file  Food Insecurity: No Food Insecurity (01/11/2024)   Hunger Vital Sign    Worried About Running Out of Food in the Last Year: Never true    Ran Out of Food in the Last Year: Never true  Transportation Needs: No Transportation Needs (01/11/2024)   PRAPARE - Administrator, Civil Service (Medical): No    Lack of Transportation (Non-Medical): No  Physical Activity: Not on file  Stress: Not on file  Social Connections: Socially Integrated (10/24/2023)   Social Connection and Isolation Panel    Frequency of Communication with Friends and Family: Three times a week    Frequency of Social Gatherings with Friends and Family: Once a week    Attends  Religious Services: 1 to 4 times per year    Active Member of [employer] or Organizations: No    Attends Engineer, Structural: 1 to 4 times per year    Marital Status: Married  Catering Manager Violence: Not At Risk (01/11/2024)   Humiliation, Afraid, Rape, and Kick questionnaire    Fear of Current or Ex-Partner: No    Emotionally Abused: No    Physically Abused: No    Sexually Abused: No    Family History:    Family History  Problem Relation Age of Onset   Hypertension Mother    Diabetes Maternal Grandmother    Colon cancer Neg Hx    Esophageal cancer Neg Hx    Rectal cancer Neg Hx    Inflammatory bowel disease Neg Hx    Liver disease Neg Hx    Pancreatic cancer Neg Hx      ROS:  Please see the history of present illness.   All other ROS reviewed and negative.     Physical Exam/Data: Vitals:   01/11/24 0325 01/11/24 0956 01/11/24 1100 01/11/24 1251  BP: 132/80 135/85  (!) 143/107  Pulse: (!) 102 96  92  Resp: 20 20  20   Temp: 97.8 F (36.6 C) 97.6 F (36.4 C)    TempSrc: Oral Oral  Oral  SpO2: 96% 97%  94%  Height:   5' 5 (1.651 m)     Intake/Output Summary (Last 24 hours) at 01/11/2024 1432 Last data filed at  01/11/2024 1251 Gross per 24 hour  Intake 1136.87 ml  Output 280 ml  Net 856.87 ml      12/11/2023   12:12 PM 11/09/2023    8:48 AM 10/24/2023    3:00 PM  Last 3 Weights  Weight (lbs) 145 lb 143 lb 9.6 oz 144 lb 13.5 oz  Weight (kg) 65.772 kg 65.137 kg 65.7 kg     Body mass index is 24.13 kg/m.  General:  Well nourished, well developed, in no acute distress.  Alert and orientated on 4 L nasal cannula.  Patient reported that he is on 2 L nasal cannula at home. HEENT: normal Neck: no JVD Vascular: No carotid bruits; Distal pulses 2+ bilaterally Cardiac:  normal S1, S2; RRR; no murmur  Lungs: Diffuse wheezing heard throughout the lungs abd: soft, nontender, no hepatomegaly  Ext: no edema Musculoskeletal:  No deformities Skin: warm and dry   Neuro:  no focal abnormalities noted Psych:  Normal affect   EKG:  The EKG was personally reviewed and demonstrates:  normal sinus rhythm with a rate of 91, LVH, and prolonged QTc with a QTc B of 467. Telemetry:  Telemetry was personally reviewed and demonstrates: Normal sinus rhythm with heart rates in the 70s and 80s.  Relevant CV Studies: TTE today IMPRESSIONS     1. Left ventricular ejection fraction, by estimation, is 55 to 60%. Left  ventricular ejection fraction by 2D MOD biplane is 58.9 %. The left  ventricle has normal function. The left ventricle has no regional wall  motion abnormalities. Left ventricular  diastolic parameters are consistent with Grade II diastolic dysfunction  (pseudonormalization).   2. Right ventricular systolic function is normal. The right ventricular  size is normal. There is moderately elevated pulmonary artery systolic  pressure.   3. Left atrial size was mildly dilated.   4. The mitral valve is normal in structure. No evidence of mitral valve  regurgitation. No evidence of mitral stenosis.   5. The aortic valve was not well visualized. Aortic valve regurgitation  is not visualized. No aortic stenosis is present.   6. The inferior vena cava is normal in size with greater than 50%  respiratory variability, suggesting right atrial pressure of 3 mmHg.   Comparison(s): No significant change from prior study.   Laboratory Data: High Sensitivity Troponin:   Recent Labs  Lab 01/10/24 2324 01/11/24 0405  TROPONINIHS 8 7     Chemistry Recent Labs  Lab 01/10/24 1320 01/10/24 1331 01/11/24 0405  NA 139 138 136  K 4.1 3.9 4.8  CL 98  --  96*  CO2 33*  --  31  GLUCOSE 90  --  228*  BUN 11  --  8  CREATININE 0.66  --  0.69  CALCIUM  9.8  --  8.3*  MG 2.1  --  2.3  GFRNONAA >60  --  >60  ANIONGAP 8  --  9    Recent Labs  Lab 01/10/24 1320 01/11/24 0405  PROT 7.7 6.7  ALBUMIN 4.0 2.9*  AST 21 22  ALT 13 15  ALKPHOS 97 77  BILITOT  0.3 0.6   Lipids No results for input(s): CHOL, TRIG, HDL, LABVLDL, LDLCALC, CHOLHDL in the last 168 hours.  Hematology Recent Labs  Lab 01/10/24 1320 01/10/24 1331 01/11/24 0405  WBC 11.6*  --  7.4  RBC 4.98  --  4.78  HGB 12.7* 15.3 11.9*  HCT 41.8 45.0 40.1  MCV 83.9  --  83.9  MCH 25.5*  --  24.9*  MCHC 30.4  --  29.7*  RDW 17.7*  --  17.2*  PLT 327  --  312   Thyroid  Recent Labs  Lab 01/10/24 2324  TSH 0.425    BNP Recent Labs  Lab 01/10/24 1320  PROBNP 328.0*    DDimer  Recent Labs  Lab 01/11/24 1048  DDIMER 0.53*    Radiology/Studies:  ECHOCARDIOGRAM COMPLETE Result Date: 01/11/2024    ECHOCARDIOGRAM REPORT   Patient Name:   Glenn [name] Date of Exam: 01/11/2024 Medical Rec #:  [id number]     Height:       65.0 in Accession #:    [id number]    Weight:       145.0 lb Date of Birth:  1961-01-06     BSA:          1.725 m Patient Age:    63 years      BP:           132/80 mmHg Patient Gender: M             HR:           86 bpm. Exam Location:  Inpatient Procedure: 2D Echo (Both Spectral and Color Flow Doppler were utilized during            procedure). Indications:    CP  History:        Patient has prior history of Echocardiogram examinations.                 Signs/Symptoms:Chest Pain.  Sonographer:    Norleen Amour Referring Phys: [phone] NILDA HERO GHERGHE IMPRESSIONS  1. Left ventricular ejection fraction, by estimation, is 55 to 60%. Left ventricular ejection fraction by 2D MOD biplane is 58.9 %. The left ventricle has normal function. The left ventricle has no regional wall motion abnormalities. Left ventricular diastolic parameters are consistent with Grade II diastolic dysfunction (pseudonormalization).  2. Right ventricular systolic function is normal. The right ventricular size is normal. There is moderately elevated pulmonary artery systolic pressure.  3. Left atrial size was mildly dilated.  4. The mitral valve is normal in structure. No evidence of  mitral valve regurgitation. No evidence of mitral stenosis.  5. The aortic valve was not well visualized. Aortic valve regurgitation is not visualized. No aortic stenosis is present.  6. The inferior vena cava is normal in size with greater than 50% respiratory variability, suggesting right atrial pressure of 3 mmHg. Comparison(s): No significant change from prior study. FINDINGS  Left Ventricle: Left ventricular ejection fraction, by estimation, is 55 to 60%. Left ventricular ejection fraction by 2D MOD biplane is 58.9 %. The left ventricle has normal function. The left ventricle has no regional wall motion abnormalities. The left ventricular internal cavity size was normal in size. There is no left ventricular hypertrophy. Left ventricular diastolic parameters are consistent with Grade II diastolic dysfunction (pseudonormalization). Right Ventricle: The right ventricular size is normal. No increase in right ventricular wall thickness. Right ventricular systolic function is normal. There is moderately elevated pulmonary artery systolic pressure. The tricuspid regurgitant velocity is 3.64 m/s, and with an assumed right atrial pressure of 3 mmHg, the estimated right ventricular systolic pressure is 56.0 mmHg. Left Atrium: Left atrial size was mildly dilated. Right Atrium: Right atrial size was normal in size. Pericardium: There is no evidence of pericardial effusion. Mitral Valve: The mitral valve is normal in structure. No evidence of mitral valve regurgitation.  No evidence of mitral valve stenosis. MV peak gradient, 2.5 mmHg. The mean mitral valve gradient is 1.0 mmHg. Tricuspid Valve: The tricuspid valve is normal in structure. Tricuspid valve regurgitation is mild . No evidence of tricuspid stenosis. Aortic Valve: The aortic valve was not well visualized. Aortic valve regurgitation is not visualized. No aortic stenosis is present. Aortic valve mean gradient measures 3.0 mmHg. Aortic valve peak gradient measures  6.2 mmHg. Aortic valve area, by VTI measures 2.96 cm. Pulmonic Valve: The pulmonic valve was not well visualized. Pulmonic valve regurgitation is not visualized. No evidence of pulmonic stenosis. Aorta: The aortic root is normal in size and structure. Venous: The inferior vena cava is normal in size with greater than 50% respiratory variability, suggesting right atrial pressure of 3 mmHg. IAS/Shunts: No atrial level shunt detected by color flow Doppler.  LEFT VENTRICLE PLAX 2D                        Biplane EF (MOD) LVIDd:         4.60 cm         LV Biplane EF:   Left LVIDs:         2.50 cm                          ventricular LV PW:         1.00 cm                          ejection LV IVS:        0.70 cm                          fraction by LVOT diam:     1.99 cm                          2D MOD LV SV:         66                               biplane is LV SV Index:   38                               58.9 %. LVOT Area:     3.11 cm                                Diastology                                LV e' medial:    7.07 cm/s LV Volumes (MOD)               LV E/e' medial:  8.7 LV vol d, MOD    91.7 ml       LV e' lateral:   9.25 cm/s A2C:                           LV E/e' lateral: 6.7 LV vol d, MOD    88.5 ml A4C: LV vol s, MOD  35.6 ml A2C: LV vol s, MOD    38.2 ml A4C: LV SV MOD A2C:   56.1 ml LV SV MOD A4C:   88.5 ml LV SV MOD BP:    53.7 ml RIGHT VENTRICLE             IVC RV Basal diam:  3.84 cm     IVC diam: 1.36 cm RV S prime:     11.60 cm/s TAPSE (M-mode): 2.1 cm      PULMONARY VEINS                             Diastolic Velocity: 62.80 cm/s                             S/D Velocity:       1.40                             Systolic Velocity:  89.80 cm/s LEFT ATRIUM             Index        RIGHT ATRIUM           Index LA diam:        3.04 cm 1.76 cm/m   RA Area:     11.70 cm LA Vol (A2C):   60.6 ml 35.12 ml/m  RA Volume:   26.40 ml  15.30 ml/m LA Vol (A4C):   69.9 ml 40.51 ml/m LA Biplane Vol: 65.3  ml 37.85 ml/m  AORTIC VALVE                    PULMONIC VALVE AV Area (Vmax):    2.33 cm     PV Vmax:       0.83 m/s AV Area (Vmean):   2.34 cm     PV Peak grad:  2.7 mmHg AV Area (VTI):     2.96 cm AV Vmax:           124.00 cm/s AV Vmean:          81.500 cm/s AV VTI:            0.223 m AV Peak Grad:      6.2 mmHg AV Mean Grad:      3.0 mmHg LVOT Vmax:         92.70 cm/s LVOT Vmean:        61.300 cm/s LVOT VTI:          0.212 m LVOT/AV VTI ratio: 0.95  AORTA Ao Root diam: 2.99 cm Ao Asc diam:  2.71 cm MITRAL VALVE               TRICUSPID VALVE MV Area (PHT): 5.93 cm    TR Peak grad:   53.0 mmHg MV Area VTI:   3.81 cm    TR Vmax:        364.00 cm/s MV Peak grad:  2.5 mmHg MV Mean grad:  1.0 mmHg    SHUNTS MV Vmax:       0.79 m/s    Systemic VTI:  0.21 m MV Vmean:      51.0 cm/s   Systemic Diam: 1.99 cm MV Decel Time: 128 msec MV E velocity: 61.80 cm/s MV A velocity: 75.00 cm/s MV E/A ratio:  0.82 Glenn [name] Electronically signed by Joelle  Azobou Tonleu Signature Date/Time: 01/11/2024/1:21:02 PM    Final    CT Angio Chest PE W and/or Wo Contrast Result Date: 01/10/2024 EXAM: CTA of the Chest with contrast for PE 01/10/2024 04:03:09 PM TECHNIQUE: CTA of the chest was performed after the administration of 75 mL of intravenous iohexol  (OMNIPAQUE ) 350 MG/ML injection 100 mL IOHEXOL  350 MG/ML SOLN. Multiplanar reformatted images are provided for review. MIP images are provided for review. Automated exposure control, iterative reconstruction, and/or weight based adjustment of the mA/kV was utilized to reduce the radiation dose to as low as reasonably achievable. COMPARISON: None available. CLINICAL HISTORY: Pulmonary embolism (PE) suspected, high prob. FINDINGS: PULMONARY ARTERIES: Pulmonary arteries are adequately opacified for evaluation. No pulmonary embolism. Main pulmonary artery is normal in caliber. MEDIASTINUM: The heart and pericardium demonstrate no acute abnormality. There is no acute  abnormality of the thoracic aorta. LYMPH NODES: No mediastinal, hilar or axillary lymphadenopathy. LUNGS AND PLEURA: Elevated left hemidiaphragm stable. Probable bilateral lung scarring is noted. Emphysematous disease is noted predominantly in the upper lobes. Minimal left basilar subsegmental atelectasis may be present. No focal consolidation or pulmonary edema. No pleural effusion or pneumothorax. UPPER ABDOMEN: Limited images of the upper abdomen are unremarkable. SOFT TISSUES AND BONES: No acute bone or soft tissue abnormality. IMPRESSION: 1. No pulmonary embolism. 2. Stable elevated left hemidiaphragm. 3. Probable bilateral lung scarring and emphysematous disease, predominantly in the upper lobes. 4. Given emphysema and typical screening age, consider evaluation for a low-dose CT lung cancer screening program. Electronically signed by: Lynwood Seip MD 01/10/2024 04:50 PM EST RP Workstation: [id number]     Assessment and Plan:  KIMARI COUDRIET is a 63 y.o. male with a hx of hypertension, hyperlipidemia, COPD, sarcoidosis, coronary calcifications, history of CVA, and type 2 diabetes who is being seen 01/11/2024 for the evaluation of chest pain at the request of Costin Gherghe.  Chest pain Coronary artery calcifications Hyperlipidemia Patient presented to the hospital for cough, chest pain and worsening shortness of breath.  Chest pain is worse with exertion and better with nitroglycerin  and oxygen .  Denies any current ongoing chest pain. Nuclear stress test was done on 05/2021 and showed no evidence of ischemia, normal LV perfusion The patient had a chest CT on 10/2022 that showed coronary artery calcifications EKG showed sinus rhythm with a rate of 91, LVH, and prolonged QTc with a QTc B of 467. TTE on 01/11/2024 showed a normal LVEF of 55 to 60%, no RWMA, G2 DD, normal RV systolic function, moderately elevated pulmonary artery systolic pressure, normal valve function, and IVC with greater than 50%  respiratory variability. Labs showed negative high-sensitivity troponins x 2, and a creatinine of 0.69. Prior to admission was on pravastatin  10mg  daily. Order lipid panel and LPA. Continue aspirin  81 mg daily Start Atorvastatin  40mg  daily Order outpatient cardiac CT   sarcoidosis COPD History of tobacco use Has been on 2 L of oxygen  for the past 4 months. pulmonary CTA was done and showed no PE, bilateral lung scarring, and emphysema.  I suspect that this is contributing to the patient's shortness of breath. Management per primary   Type 2 diabetes Last hemoglobin A1c was 7.6. Increases patient's cardiovascular risk Management per primary   Hypertension Blood pressure appears to be stable and slightly high at times.  Most recent BP was 143/107. Continue amlodipine  10 mg daily. May consider starting an ARB   Risk Assessment/Risk Scores:         [facility] will  sign off.   The patient is ready for discharge today from a cardiac standpoint. Medication Recommendations:  as above Other recommendations (labs, testing, etc): Outpatient cardiac CT. needs follow-up lipid panel and LFTs in about 6 weeks Follow up as an outpatient: In 2-4 weeks.  For questions or updates, please contact [facility] Please consult [email or web address] for contact info under     Signed, Morse Clause, PA-C  01/11/2024 2:32 PM

## 2024-01-11 NOTE — Progress Notes (Signed)
 Initial Nutrition Assessment  DOCUMENTATION CODES:   Non-severe (moderate) malnutrition in context of chronic illness  INTERVENTION:  Liberalize diet to carb modified to promote adequate blood sugar control in addition to providing widest variety of menu options to optimize oral intake in the setting of COPD  Ensure Plus High Protein po BID, each supplement provides 350 kcal and 20 grams of protein. MVI with minerals daily Request updated measured weight  NUTRITION DIAGNOSIS:  Moderate Malnutrition related to chronic illness (COPD) as evidenced by mild fat depletion, moderate muscle depletion, severe muscle depletion  GOAL:  Patient will meet greater than or equal to 90% of their needs  MONITOR:  PO intake, Supplement acceptance, Labs, Weight trends  REASON FOR ASSESSMENT:  Consult Assessment of nutrition requirement/status  ASSESSMENT:  Pt admitted from home with shortness of breath. PMH significant for COPD, DM2, HTN, CVA and HLD.  Spoke with patient at bedside. He is in good spirits.  He states that he feels better today. His appetite is good and at his baseline.  He reports that he eats 2 meals per day. He generally sleeps in and then has lunch and dinner which he mentions that he eats a standard meal with protein and sides. Only foods that he does not consume/has an allergy  to are pork and shellfish.   He states that on occasion he will consume Ensure but not on a consistent basis. He will generally snack at night on items such as chips and ice cream.   Pt reports that he generally weighs 140-145 lbs and does not report any significant weight loss.   Last weight on file.  Was 65.8 kg on 11/3.  Prior to this, weights have been limited though appear stable.   Medications: prednisone , IV abx  Labs:  Chloride 96 Albumin 2.9 CBG's 197 HGbA1c 7.6% (08/26/23)  NUTRITION - FOCUSED PHYSICAL EXAM: Despite patient report of stable oral intake and stable weight, presence of  muscle and subcutaneous tissue loss is suggestive of a chronic period of undernutrition in the setting of COPD.  Flowsheet Row Most Recent Value  Orbital Region Mild depletion  Upper Arm Region Severe depletion  Thoracic and Lumbar Region Mild depletion  Buccal Region Moderate depletion  Temple Region Moderate depletion  Clavicle Bone Region Moderate depletion  Clavicle and Acromion Bone Region Moderate depletion  Scapular Bone Region Unable to assess  Dorsal Hand Moderate depletion  Patellar Region Severe depletion  Anterior Thigh Region Severe depletion  Posterior Calf Region Moderate depletion  Edema (RD Assessment) None  Hair Reviewed  Eyes Reviewed  Mouth Reviewed  Skin Reviewed  Nails Reviewed    Diet Order:   Diet Order             Diet Carb Modified Fluid consistency: Thin  Diet effective now                   EDUCATION NEEDS:   Education needs have been addressed  Skin:  Skin Assessment: Reviewed RN Assessment  Last BM:  12/3  Height:   Ht Readings from Last 1 Encounters:  01/11/24 5' 5 (1.651 m)    Weight:   Wt Readings from Last 1 Encounters:  12/11/23 65.8 kg   BMI:  Body mass index is 24.13 kg/m.  Estimated Nutritional Needs:   Kcal:  1800-2000  Protein:  90-105g  Fluid:  >/=1.8L  Allie Celie Desrochers, RDN, LDN Clinical Nutrition See AMiON for contact information.

## 2024-01-12 LAB — COMPREHENSIVE METABOLIC PANEL WITH GFR
ALT: 14 U/L (ref 0–44)
AST: 15 U/L (ref 15–41)
Albumin: 2.8 g/dL — ABNORMAL LOW (ref 3.5–5.0)
Alkaline Phosphatase: 63 U/L (ref 38–126)
Anion gap: 5 (ref 5–15)
BUN: 11 mg/dL (ref 8–23)
CO2: 36 mmol/L — ABNORMAL HIGH (ref 22–32)
Calcium: 8.8 mg/dL — ABNORMAL LOW (ref 8.9–10.3)
Chloride: 98 mmol/L (ref 98–111)
Creatinine, Ser: 0.65 mg/dL (ref 0.61–1.24)
GFR, Estimated: >60 mL/min (ref >60)
Glucose, Bld: 85 mg/dL (ref 70–99)
Potassium: 4.5 mmol/L (ref 3.5–5.1)
Sodium: 139 mmol/L (ref 135–145)
Total Bilirubin: 0.2 mg/dL (ref 0.0–1.2)
Total Protein: 6.2 g/dL — ABNORMAL LOW (ref 6.5–8.1)

## 2024-01-12 LAB — GLUCOSE, CAPILLARY
Glucose-Capillary: 294 mg/dL — ABNORMAL HIGH (ref 70–99)
Glucose-Capillary: 121 mg/dL — ABNORMAL HIGH (ref 70–99)
Glucose-Capillary: 183 mg/dL — ABNORMAL HIGH (ref 70–99)

## 2024-01-12 LAB — MAGNESIUM: Magnesium: 2.2 mg/dL (ref 1.7–2.4)

## 2024-01-12 LAB — LIPID PANEL
Cholesterol: 113 mg/dL (ref 0–200)
HDL: 47 mg/dL (ref >40)
LDL Cholesterol: 58 mg/dL (ref 0–99)
Total CHOL/HDL Ratio: 2.4 ratio
Triglycerides: 40 mg/dL (ref <150)
VLDL: 8 mg/dL (ref 0–40)

## 2024-01-12 LAB — CBC
HCT: 37.3 % — ABNORMAL LOW (ref 39.0–52.0)
Hemoglobin: 10.9 g/dL — ABNORMAL LOW (ref 13.0–17.0)
MCH: 24.9 pg — ABNORMAL LOW (ref 26.0–34.0)
MCHC: 29.2 g/dL — ABNORMAL LOW (ref 30.0–36.0)
MCV: 85.4 fL (ref 80.0–100.0)
Platelets: 308 K/uL (ref 150–400)
RBC: 4.37 MIL/uL (ref 4.22–5.81)
RDW: 17.2 % — ABNORMAL HIGH (ref 11.5–15.5)
WBC: 12.2 K/uL — ABNORMAL HIGH (ref 4.0–10.5)
nRBC: 0 % (ref 0.0–0.2)

## 2024-01-12 LAB — PHOSPHORUS: Phosphorus: 3.5 mg/dL (ref 2.5–4.6)

## 2024-01-12 MED ORDER — PROMETHAZINE HCL 6.25 MG/5ML PO SOLN
6.2500 mg | Freq: Four times a day (QID) | ORAL | Status: DC | PRN
  Administered 2024-01-13 – 2024-01-14 (×3): 6.25 mg via ORAL
  Filled 2024-01-12 (×4): qty 5

## 2024-01-12 NOTE — Consult Note (Signed)
 TELE-PCCM CONSULT This consult was provided via telemedicine with 2-way video and audio communication.  NAME:  Glenn [name], MRN:  [id number], DOB:  07/28/1960, LOS: 1 ADMISSION DATE:  01/10/2024, CONSULTATION DATE:  01/12/2024 REFERRING MD:  Dr Trixie, CHIEF COMPLAINT:  COPD   History of Present Illness:  Mr Glenn [name] is a 63 year old male with history of COPD, sarcoidosis, prior stroke, diabetes mellitus, coronary artery calcification, who was admitted for chest pain and progressive dyspnea on exertion.  Cardiology was consulted for possible underlying unstable angina. Pulmonary was consulted at the request of cardiology for possible pulmonary lesions of worsening dyspnea and likely COPD exacerbation  He follows with Dr. Darlean in pulmonary clinic.  As per his notes, patient has history of bilateral lung surgery?  Volume reduction surgery.  He has prior history of asthma.  PFT's  08/11/2020  FEV1 1.34 (54 % ) ratio 0.61  p 10 % improvement from albuterol  DLCO  11.94 (52%) corrects to 3.41 (77%)  for alv volume   Pertinent  Medical History  COPD Sarcoidosis asthma  Significant Hospital Events: Including procedures, antibiotic start and stop dates in addition to other pertinent events   Admitted to [facility] 12/04 Pulm ccm consulted  Interim History / Subjective:  Pt is on [place] saturating 93% Currently on 2 L, which is his home dose Reports cough with deep breathing, requesting anti cough medications   Objective   Blood pressure 137/87, pulse (!) 106, temperature 98 F (36.7 C), temperature source Oral, resp. rate 20, height 5' 5 (1.651 m), weight 65.1 kg, SpO2 92%.    FiO2 (%):  [36 %] 36 %   Intake/Output Summary (Last 24 hours) at 01/12/2024 1336 Last data filed at 01/12/2024 1212 Gross per 24 hour  Intake 500 ml  Output 600 ml  Net -100 ml   Filed Weights   01/12/24 0500  Weight: 65.1 kg    Examination: General:  Pleasant man Decreased bilateral breath sounds, mildly  dyspneic  Lab/imaging review: ABG 12/0/2025 7.3 1/70/85/ 97% sat  Eosinophil 500 at admission Absolute eosinophil count as high as 600 in February/2024 Prior allergy  labs revealed IgE to cockroaches  Echo 01/2024  1. Left ventricular ejection fraction, by estimation, is 55 to 60%. Left  ventricular ejection fraction by 2D MOD biplane is 58.9 %. The left  ventricle has normal function. The left ventricle has no regional wall  motion abnormalities. Left ventricular  diastolic parameters are consistent with Grade II diastolic dysfunction  (pseudonormalization).   2. Right ventricular systolic function is normal. The right ventricular  size is normal. There is moderately elevated pulmonary artery systolic  pressure.   3. Left atrial size was mildly dilated.   4. The mitral valve is normal in structure. No evidence of mitral valve  regurgitation. No evidence of mitral stenosis.   5. The aortic valve was not well visualized. Aortic valve regurgitation  is not visualized. No aortic stenosis is present.   6. The inferior vena cava is normal in size with greater than 50%  respiratory variability, suggesting right atrial pressure of 3 mmHg.   CT chest angio 01/10/2024 Elevated left hemidiaphragm stable. Probable bilateral lung scarring is noted. Emphysematous disease is noted predominantly in the upper lobes. Minimal left basilar subsegmental atelectasis may be present. No focal consolidation or pulmonary edema. No pleural effusion or pneumothorax.  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  History of sarcoidosis/history of prior lung surgery- not on any therapy currently History of asthma,  allergies to cockroaches on prior labs  - Agree with steroids, ICS/LAMA/LAMA nebulizer - Agree with empiric azithromycin .  No obvious pneumonia on CT chest, chronic scarring - pt  reports worsened cough since pneumonia a month ago. He reports cough worsened when anticough medications were completed - Respiratory panel negative - Has had elevated eosinophils, may be a candidate for antieosinophilic agents outpatient like Dupixent for severe COPD, COPD/asthma overlap syndrome.  - placed oral antitussive  agents - agree with steroids- please taper over 7 days  Pulm clinic follow up in 2-3 weeks  Will follow peripherally  Labs   CBC: Recent Labs  Lab 01/10/24 1320 01/10/24 1331 01/11/24 0405 01/12/24 0307  WBC 11.6*  --  7.4 12.2*  NEUTROABS 7.9*  --   --   --   HGB 12.7* 15.3 11.9* 10.9*  HCT 41.8 45.0 40.1 37.3*  MCV 83.9  --  83.9 85.4  PLT 327  --  312 308    Basic Metabolic Panel: Recent Labs  Lab 01/10/24 1320 01/10/24 1331 01/10/24 2324 01/11/24 0405 01/12/24 0307  NA 139 138  --  136 139  K 4.1 3.9  --  4.8 4.5  CL 98  --   --  96* 98  CO2 33*  --   --  31 36*  GLUCOSE 90  --   --  228* 85  BUN 11  --   --  8 11  CREATININE 0.66  --   --  0.69 0.65  CALCIUM  9.8  --   --  8.3* 8.8*  MG 2.1  --   --  2.3 2.2  PHOS  --   --  3.1 2.9 3.5   GFR: Estimated Creatinine Clearance: 81.1 mL/min (by C-G formula based on SCr of 0.65 mg/dL). Recent Labs  Lab 01/10/24 1320 01/10/24 2324 01/10/24 2346 01/11/24 0405 01/12/24 0307  PROCALCITON  --  <0.10  --   --   --   WBC 11.6*  --   --  7.4 12.2*  LATICACIDVEN  --  2.8* 2.9* 1.7  --     Liver Function Tests: Recent Labs  Lab 01/10/24 1320 01/11/24 0405 01/12/24 0307  AST 21 22 15   ALT 13 15 14   ALKPHOS 97 77 63  BILITOT 0.3 0.6 0.2  PROT 7.7 6.7 6.2*  ALBUMIN 4.0 2.9* 2.8*   No results for input(s): LIPASE, AMYLASE in the last 168 hours. No results for input(s): AMMONIA in the last 168 hours.  ABG    Component Value Date/Time   HCO3 35.2 (H) 01/11/2024 0409   TCO2 38 (H) 01/10/2024 1331   O2SAT 97.6 01/11/2024 0409     Coagulation Profile: No results for input(s):  INR, PROTIME in the last 168 hours.  Cardiac Enzymes: Recent Labs  Lab 01/10/24 2324  CKTOTAL 246    HbA1C: Hgb A1c MFr Bld  Date/Time Value Ref Range Status  08/26/2023 06:37 PM 7.6 (H) 4.8 - 5.6 % Final    Comment:    (NOTE) Diagnosis of Diabetes The following HbA1c ranges recommended by the American Diabetes Association (ADA) may be used as an aid in the diagnosis of diabetes mellitus.  Hemoglobin             Suggested A1C NGSP%              Diagnosis  <5.7  Non Diabetic  5.7-6.4                Pre-Diabetic  >6.4                   Diabetic  <7.0                   Glycemic control for                       adults with diabetes.    01/10/2022 12:34 PM 7.5 (H) 4.8 - 5.6 % Final    Comment:    (NOTE)         Prediabetes: 5.7 - 6.4         Diabetes: >6.4         Glycemic control for adults with diabetes: <7.0     CBG: Recent Labs  Lab 01/11/24 0600 01/11/24 1244 01/11/24 1702 01/12/24 1206  GLUCAP 197* 194* 153* 294*    Review of Systems:   Negative except mentioned in HPI  Past Medical History:  He,  has a past medical history of Anxiety, Arthritis, Asthma, Chronic pain (05/06/2020), COPD (chronic obstructive pulmonary disease) (HCC), CVA (cerebral vascular accident) (HCC), Depression, Diabetes mellitus type 2 in nonobese (HCC), Dyspnea, ED (erectile dysfunction), Hyperlipidemia, Hypertension, Sarcoid, and Tobacco dependence (05/06/2020).   Surgical History:   Past Surgical History:  Procedure Laterality Date   LUNG SURGERY     removed tissue     Social History:   reports that he quit smoking about 3 years ago. His smoking use included cigarettes. He started smoking about 37 years ago. He has a 34 pack-year smoking history. He has never been exposed to tobacco smoke. He has never used smokeless tobacco. He reports that he does not drink alcohol and does not use drugs.   Family History:  His family history includes Diabetes in  his maternal grandmother; Hypertension in his mother. There is no history of Colon cancer, Esophageal cancer, Rectal cancer, Inflammatory bowel disease, Liver disease, or Pancreatic cancer.   Allergies Allergies  Allergen Reactions   Insulins Swelling and Other (See Comments)    04/2020. Do not use Novolog  insulin . Swelling of tongue   Porcine (Pork) Protein-Containing Drug Products Nausea And Vomiting and Other (See Comments)    Cultural-Muslim and NOT TOLERATED, AS WELL   Acetaminophen  Nausea Only and Other (See Comments)    Abdominal sensitivity and Abdominal discomfort   Ibuprofen Other (See Comments)    Abdominal sensitivity/Abdominal discomfort   Lisinopril Swelling, Rash and Other (See Comments)    Tongue swelling   Shrimp [Shellfish Allergy ] Itching   Insulin  Aspart Swelling and Other (See Comments)    Tongue swelling   Amoxicillin -Pot Clavulanate Nausea And Vomiting     Home Medications  Prior to Admission medications   Medication Sig Start Date End Date Taking? Authorizing Provider  albuterol  (VENTOLIN  HFA) 108 (90 Base) MCG/ACT inhaler Inhale 1-2 puffs into the lungs every 6 (six) hours as needed for wheezing or shortness of breath. Patient taking differently: Inhale 2 puffs into the lungs every 6 (six) hours as needed for shortness of breath. 08/29/23  Yes Sebastian Toribio GAILS, MD  amLODipine  (NORVASC ) 10 MG tablet Take 1 tablet (10 mg total) by mouth daily. 10/03/21  Yes Rai, Ripudeep K, MD  aspirin  EC 81 MG tablet Take 81 mg by mouth daily. Swallow whole.   Yes [provider]  budesonide -formoterol  (SYMBICORT ) 160-4.5 MCG/ACT inhaler Inhale  2 puffs into the lungs 2 (two) times daily. 08/29/23  Yes Sebastian Toribio GAILS, MD  Cholecalciferol (VITAMIN D3) 50 MCG (2000 UT) CHEW Chew 4,000 Units by mouth daily.   Yes [provider]  docusate sodium  (COLACE) 100 MG capsule Take 100 mg by mouth 2 (two) times daily as needed for mild constipation.   Yes [provider]  doxycycline  (VIBRAMYCIN ) 100 MG capsule Take 1 capsule (100 mg total) by mouth 2 (two) times daily. 12/11/23  Yes Sofia, Leslie K, PA-C  famotidine  (PEPCID ) 20 MG tablet One after supper Patient taking differently: Take 20 mg by mouth daily as needed for heartburn or indigestion. 11/22/21  Yes Darlean Ozell NOVAK, MD  ferrous sulfate  325 (65 FE) MG tablet Take 325 mg by mouth every Monday, Wednesday, and Friday.   Yes [provider]  fluticasone  (FLONASE ) 50 MCG/ACT nasal spray Place 2 sprays into both nostrils daily for 10 days. Patient taking differently: Place 2 sprays into both nostrils daily as needed for allergies or rhinitis (or seasonal allergies). 08/30/23  Yes Sebastian Toribio GAILS, MD  guaiFENesin  (MUCINEX ) 600 MG 12 hr tablet Take 600 mg by mouth in the morning.   Yes [provider]  hydrOXYzine  (ATARAX ) 25 MG tablet Take 25 mg by mouth at bedtime. 08/08/23  Yes [provider]  metFORMIN  (GLUCOPHAGE ) 500 MG tablet Take 1 tablet (500 mg total) by mouth 2 (two) times daily with a meal. Patient taking differently: Take 500 mg by mouth 2 (two) times daily before a meal. 10/03/21  Yes Rai, Ripudeep K, MD  montelukast  (SINGULAIR ) 10 MG tablet Take 10 mg by mouth at bedtime.   Yes [provider]  oxyCODONE  (OXYCONTIN ) 10 mg 12 hr tablet Take 10 mg by mouth every 12 (twelve) hours.   Yes [provider]  Oxycodone  HCl 20 MG TABS Take 20 mg by mouth every 6 (six) hours as needed (pain).   Yes [provider]  OXYGEN  Inhale 2-3 L/min into the lungs as needed (for shortness of breath when exerted).   Yes [provider]  pantoprazole  (PROTONIX ) 40 MG tablet Take 1 tablet (40 mg total) by mouth daily. 07/02/19  Yes Elgergawy, Brayton RAMAN, MD  polyethylene glycol (MIRALAX  / GLYCOLAX ) 17 g packet Take 17 g by mouth daily. Patient taking differently: Take 17 g by mouth daily as needed for moderate constipation. 08/30/23  Yes Sebastian Toribio GAILS, MD  pravastatin  (PRAVACHOL ) 10 MG tablet Take 10 mg by mouth at bedtime.   Yes [provider]  prazosin  (MINIPRESS ) 1 MG capsule Take 1 mg by mouth at bedtime.   Yes [provider]  senna-docusate (SENOKOT-S) 8.6-50 MG tablet Take 1 tablet by mouth 2 (two) times daily. Patient taking differently: Take 1 tablet by mouth 2 (two) times daily as needed for mild constipation or moderate constipation. 08/29/23  Yes Sebastian Toribio GAILS, MD  sildenafil  (VIAGRA ) 100 MG tablet Take 1 tablet (100 mg total) by mouth daily as needed. Patient taking differently: Take 100 mg by mouth daily as needed for erectile dysfunction. 09/28/23  Yes   tamsulosin  (FLOMAX ) 0.4 MG CAPS capsule Take 1 capsule (0.4 mg total) by mouth daily. 08/29/23  Yes Sebastian Toribio GAILS, MD  Tiotropium Bromide  Monohydrate 2.5 MCG/ACT AERS Inhale 2 puffs into the lungs daily.   Yes [provider]  vitamin C (ASCORBIC ACID ) 250 MG tablet Take 250 mg by mouth daily.   Yes [provider]  clopidogrel  (PLAVIX )  75 MG tablet Take 1 tablet (75 mg total) by mouth daily. Patient not taking: Reported on 10/24/2023 08/30/23   Sebastian Toribio GAILS, MD  EPINEPHrine  0.3 mg/0.3 mL IJ SOAJ injection Inject 0.3 mg into the muscle as needed for anaphylaxis. 08/08/23   [provider]  ipratropium-albuterol  (DUONEB) 0.5-2.5 (3) MG/3ML SOLN Take 3 mLs by nebulization 3 (three) times daily for 5 days, THEN 3 mLs every 6 (six) hours as needed for up to 5 days. Patient taking differently: Nebulize 3 ml's and inhale into the lungs 4-5 times a day 08/29/23 11/09/23  Sebastian Toribio GAILS, MD  loratadine  (CLARITIN ) 10 MG tablet Take 1 tablet (10 mg total) by mouth daily. Patient not taking: Reported on 10/25/2023 01/03/22     naloxone  (NARCAN ) nasal spray 4 mg/0.1 mL Place 1 spray into the nose as needed (opioid overdose - respiratory distress). 08/20/21   [provider]  predniSONE  (DELTASONE ) 50 MG tablet One tablet  a day for 5 days Patient not taking: Reported on 01/10/2024 12/11/23   Flint Sonny POUR, PA-C  promethazine -dextromethorphan  (PROMETHAZINE -DM) 6.25-15 MG/5ML syrup Take 5 mLs by mouth 4 (four) times daily as needed for cough. Patient not taking: Reported on 01/10/2024 12/11/23   Flint Sonny POUR, PA-C     Total time: N/A    Thank you for the opportunity to take part in the care of this patient   Ladaysha Soutar Pleas, MD 01/12/24 1:36 PM  Pulmonary & Critical Care  For contact information, see Amion. If no response to pager, please call PCCM consult pager. After hours, 7PM- 7AM, please call Elink.

## 2024-01-12 NOTE — Progress Notes (Signed)
 Occupational Therapy Treatment Patient Details Name: Glenn [name] MRN: [id number] DOB: 05/05/1960 Today's Date: 01/12/2024   History of present illness 63 y/o M presenting to ED on 12/3 with SOB, SpO2 64% on arrival. Admitted for COPD exacerbation    PMH includes COPD, DM2, HTN, CVA, HLD   OT comments  Progressed to complete LB dressing, standing ADLs, and functional mobility in room with mod I for increased time. Pt with family present and reporting no ADL concerns for d/c. Educated pt on need to use O2 at all times to reduce DOE, pt verbalized understanding. Pt reporting at baseline, no further acute OT needs. All goals met, education complete, OT is signing off on this pt.       If plan is discharge home, recommend the following:  A little help with walking and/or transfers;A little help with bathing/dressing/bathroom;Assistance with cooking/housework;Assist for transportation;Help with stairs or ramp for entrance   Equipment Recommendations  Tub/shower seat       Precautions / Restrictions Precautions Precautions: Fall Recall of Precautions/Restrictions: Intact Precaution/Restrictions Comments: 2L O2 baseline Restrictions Weight Bearing Restrictions Per Provider Order: No       Mobility Bed Mobility Overal bed mobility: Modified Independent      Transfers Overall transfer level: Needs assistance Equipment used: None Transfers: Sit to/from Stand Sit to Stand: Supervision           General transfer comment: Increased time. S for safety with lines     Balance Overall balance assessment: Needs assistance Sitting-balance support: Feet supported Sitting balance-Leahy Scale: Good     Standing balance support: No upper extremity supported, During functional activity Standing balance-Leahy Scale: Fair       ADL either performed or assessed with clinical judgement   ADL Overall ADL's : At baseline;Modified independent       General ADL Comments: Mod I for  increased time. Completed LB dressing, standing ADLs and functional transfers no AD. Pt denying DOE    Extremity/Trunk Assessment Upper Extremity Assessment Upper Extremity Assessment: Overall WFL for tasks assessed   Lower Extremity Assessment Lower Extremity Assessment: Defer to PT evaluation        Vision   Vision Assessment?: No apparent visual deficits         Communication Communication Communication: No apparent difficulties   Cognition Arousal: Alert Behavior During Therapy: WFL for tasks assessed/performed Cognition: No apparent impairments             OT - Cognition Comments: Family present reporting no cog deficits       Following commands: Intact        Cueing   Cueing Techniques: Verbal cues        General Comments family present and supportive    Pertinent Vitals/ Pain       Pain Assessment Pain Assessment: No/denies pain   Frequency  Min 2X/week        Progress Toward Goals  OT Goals(current goals can now be found in the care plan section)  Progress towards OT goals: Goals met/education completed, patient discharged from OT  Acute Rehab OT Goals Patient Stated Goal: To shower OT Goal Formulation: All assessment and education complete, DC therapy Time For Goal Achievement: 01/25/24 Potential to Achieve Goals: Good ADL Goals Pt Will Perform Grooming: with modified independence;standing Pt Will Perform Upper Body Dressing: with modified independence;standing Pt Will Perform Lower Body Dressing: with modified independence;sit to/from stand;sitting/lateral leans Pt Will Transfer to Toilet: with modified independence;ambulating;regular height toilet Pt Will Perform  Tub/Shower Transfer: Tub transfer;Shower transfer;with modified independence;ambulating  Plan         AM-PAC OT 6 Clicks Daily Activity     Outcome Measure   Help from another person eating meals?: None Help from another person taking care of personal grooming?:  None Help from another person toileting, which includes using toliet, bedpan, or urinal?: None Help from another person bathing (including washing, rinsing, drying)?: None Help from another person to put on and taking off regular upper body clothing?: None Help from another person to put on and taking off regular lower body clothing?: None 6 Click Score: 24    End of Session Equipment Utilized During Treatment: Oxygen   OT Visit Diagnosis: Unsteadiness on feet (R26.81);Other abnormalities of gait and mobility (R26.89);Muscle weakness (generalized) (M62.81)   Activity Tolerance Patient tolerated treatment well   Patient Left in bed;with call bell/phone within reach;with nursing/sitter in room   Nurse Communication Mobility status        Time: 8389-8377 OT Time Calculation (min): 12 min  Charges: OT General Charges $OT Visit: 1 Visit OT Treatments $Self Care/Home Management : 8-22 mins  Adrianne BROCKS, OT  Acute Rehabilitation Services Office [phone] Secure chat preferred   Adrianne GORMAN [employer]vers 01/12/2024, 4:52 PM

## 2024-01-12 NOTE — Progress Notes (Signed)
 Mobility Specialist Progress Note;   01/12/24 1031  Mobility  Activity Ambulated with assistance  Level of Assistance Standby assist, set-up cues, supervision of patient - no hands on  Assistive Device None  Distance Ambulated (ft) 250 ft  Activity Response Tolerated well  Mobility Referral Yes  Mobility visit 1 Mobility  Mobility Specialist Start Time (ACUTE ONLY) 1031  Mobility Specialist Stop Time (ACUTE ONLY) 1048  Mobility Specialist Time Calculation (min) (ACUTE ONLY) 17 min   Pt agreeable to mobility. States he is feeling much better than yesterday when therapy came. On 2LO2 upon arrival. Required no physical assistance during ambulation, SV for safety. Ambulated on 2LO2. Took 1x standing rest break d/t chest tightness, recovers within ~1-2 mins. HR up to 120 bpm w/ exertion. Pt returned back to bed and left with all needs met, call bell in reach.   Lauraine Erm Mobility Specialist Please contact via SecureChat or [employer] [phone]

## 2024-01-12 NOTE — Progress Notes (Signed)
 PROGRESS NOTE  Glenn [name] FMW:[id number] DOB: Jul 16, 1960 DOA: 01/10/2024 PCP: [facility]   LOS: 1 day   Brief Narrative / Interim history: 63 year old male with history of COPD, DM 2, HTN, hyperlipidemia comes into the hospital with shortness of breath, chest tightness.  He is on 2 L at home.  Patient basically tells me that he has had multiple hospitalizations this year, and he feels like he really does not have a good baseline anymore.  Symptoms have gotten much worse over the last couple of months after a trip in [place].  He states that he he gets very short of breath, wheezy, gets hospitalized, feels better but has found himself more more limited to how much he can walk.  He reports also worsening substernal chest pain, pressure-like, every time he tries to get up and move around.  He does not know any cardiac history that he is aware of, but his chest pain is fairly predictable, happens with ambulation, he becomes short winded and upon stopping his chest pain goes away.  Subjective / 24h Interval events: Continues to complain of significant shortness of breath with just walking to the bathroom, also lightheadedness and dizziness when standing.  He is being frustrated about everything that is been going on in the past few months, few hospitalizations, feels like he is getting worse  Assesement and Plan: Principal problem Acute on chronic hypoxic respiratory failure, underlying COPD with possible exacerbation -patient has no wheezing on my exam, but has had wheezing on admission.  He was started on nebulizers, steroids, azithromycin .  Continue - VBG shows compensated hypercarbia - He quit smoking in 2022 - Due to lack of improvement, persistent symptoms, frequent hospitalization, Consulted pulmonary today, appreciate input  Active problems Chest pain -pretty much with every activity, raising concern for a component of angina.  He does have poor respiratory status at baseline  but chest pain has some typical characteristics such as getting worse with activity and resolving with Sublingual nitroglycerin  and rest - Cardiology consulted and evaluated patient yesterday, they will start ischemic workup as an outpatient  History of CVA-continue aspirin , he apparently is not on Plavix  anymore  Chronic pain-continue Cefalu and short acting home pain regimen   History of sarcoidosis-do not have a lot of background on this, pulm consulted  Scheduled Meds:  amLODipine   10 mg Oral Daily   arformoterol   15 mcg Nebulization BID   aspirin  EC  81 mg Oral Daily   azithromycin   500 mg Oral Daily   budesonide  (PULMICORT ) nebulizer solution  0.5 mg Nebulization Q12H   feeding supplement  237 mL Oral BID BM   hydrOXYzine   25 mg Oral QHS   ipratropium-albuterol   3 mL Nebulization Q6H   montelukast   10 mg Oral QHS   multivitamin with minerals  1 tablet Oral Daily   oxyCODONE   10 mg Oral Q12H   pantoprazole   40 mg Oral Daily   pravastatin   10 mg Oral QHS   prazosin   1 mg Oral QHS   predniSONE   40 mg Oral Q breakfast   sodium chloride  flush  3 mL Intravenous Q12H   tamsulosin   0.4 mg Oral Daily   Continuous Infusions:   PRN Meds:.albuterol , fentaNYL  (SUBLIMAZE ) injection, guaiFENesin , ondansetron  **OR** ondansetron  (ZOFRAN ) IV, oxyCODONE , senna-docusate  Current Outpatient Medications  Medication Instructions   albuterol  (VENTOLIN  HFA) 108 (90 Base) MCG/ACT inhaler 1-2 puffs, Inhalation, Every 6 hours PRN   amLODipine  (NORVASC ) 10 mg, Oral, Daily  aspirin  EC 81 mg, Daily   budesonide -formoterol  (SYMBICORT ) 160-4.5 MCG/ACT inhaler 2 puffs, Inhalation, 2 times daily   clopidogrel  (PLAVIX ) 75 mg, Oral, Daily   docusate sodium  (COLACE) 100 mg, 2 times daily PRN   doxycycline  (VIBRAMYCIN ) 100 mg, Oral, 2 times daily   EPINEPHrine  (EPI-PEN) 0.3 mg, As needed   famotidine  (PEPCID ) 20 MG tablet One after supper   ferrous sulfate  325 mg, Every M-W-F   fluticasone  (FLONASE ) 50  MCG/ACT nasal spray Place 2 sprays into both nostrils daily for 10 days.   guaiFENesin  (MUCINEX ) 600 mg, Every morning   hydrOXYzine  (ATARAX ) 25 mg, Daily at bedtime   ipratropium-albuterol  (DUONEB) 0.5-2.5 (3) MG/3ML SOLN Take 3 mLs by nebulization 3 (three) times daily for 5 days, THEN 3 mLs every 6 (six) hours as needed for up to 5 days.   loratadine  (CLARITIN ) 10 mg, Oral, Daily   metFORMIN  (GLUCOPHAGE ) 500 mg, Oral, 2 times daily with meals   montelukast  (SINGULAIR ) 10 mg, Daily at bedtime   naloxone  (NARCAN ) nasal spray 4 mg/0.1 mL 1 spray, As needed   oxyCODONE  (OXYCONTIN ) 10 mg, Every 12 hours   Oxycodone  HCl 20 mg, Every 6 hours PRN   OXYGEN  2-3 L/min, As needed   pantoprazole  (PROTONIX ) 40 mg, Oral, Daily   polyethylene glycol (MIRALAX  / GLYCOLAX ) 17 g, Oral, Daily   pravastatin  (PRAVACHOL ) 10 mg, Daily at bedtime   prazosin  (MINIPRESS ) 1 mg, Daily at bedtime   predniSONE  (DELTASONE ) 50 MG tablet One tablet a day for 5 days   promethazine -dextromethorphan  (PROMETHAZINE -DM) 6.25-15 MG/5ML syrup 5 mLs, Oral, 4 times daily PRN   senna-docusate (SENOKOT-S) 8.6-50 MG tablet 1 tablet, Oral, 2 times daily   sildenafil  (VIAGRA ) 100 mg, Oral, Daily PRN   tamsulosin  (FLOMAX ) 0.4 mg, Oral, Daily   Tiotropium Bromide  Monohydrate 2.5 MCG/ACT AERS 2 puffs, Inhalation, Daily   vitamin C (ASCORBIC ACID ) 250 mg, Daily   Vitamin D3 4,000 Units, Daily    Diet Orders (From admission, onward)     Start     Ordered   01/11/24 0947  Diet Carb Modified Fluid consistency: Thin  Diet effective now       Question Answer Comment  Diet-HS Snack? Snack-yes   Calorie Level Medium 1600-2000   Fluid consistency: Thin      01/11/24 0946            DVT prophylaxis: SCDs Start: 01/10/24 2354   Lab Results  Component Value Date   PLT 308 01/12/2024      Code Status: Full Code  Family Communication: No family at bedside  Status is: Inpatient Remains inpatient appropriate because: Severity  of illness   Level of care: Progressive  Consultants:  Cardiology  Objective: Vitals:   01/12/24 0000 01/12/24 0446 01/12/24 0457 01/12/24 0500  BP:  (!) 135/92    Pulse:  94 93   Resp: (!) 21 17 20  (!) 23  Temp:  97.7 F (36.5 C)    TempSrc:  Oral    SpO2:  100% (S) 99%   Weight:    65.1 kg  Height:        Intake/Output Summary (Last 24 hours) at 01/12/2024 1113 Last data filed at 01/12/2024 0500 Gross per 24 hour  Intake 500 ml  Output 500 ml  Net 0 ml   Wt Readings from Last 3 Encounters:  01/12/24 65.1 kg  12/11/23 65.8 kg  11/09/23 65.1 kg    Examination:  Constitutional: NAD Eyes: lids and conjunctivae normal,  no scleral icterus ENMT: mmm Neck: normal, supple Respiratory: Unable to take a deep breath given coughing spells, scant wheezing heard Cardiovascular: Regular rate and rhythm, no murmurs / rubs / gallops. No LE edema. Abdomen: soft, no distention, no tenderness. Bowel sounds positive.   Data Reviewed: I have independently reviewed following labs and imaging studies   CBC Recent Labs  Lab 01/10/24 1320 01/10/24 1331 01/11/24 0405 01/12/24 0307  WBC 11.6*  --  7.4 12.2*  HGB 12.7* 15.3 11.9* 10.9*  HCT 41.8 45.0 40.1 37.3*  PLT 327  --  312 308  MCV 83.9  --  83.9 85.4  MCH 25.5*  --  24.9* 24.9*  MCHC 30.4  --  29.7* 29.2*  RDW 17.7*  --  17.2* 17.2*  LYMPHSABS 2.3  --   --   --   MONOABS 0.8  --   --   --   EOSABS 0.5  --   --   --   BASOSABS 0.1  --   --   --     Recent Labs  Lab 01/10/24 1320 01/10/24 1331 01/10/24 2324 01/10/24 2346 01/11/24 0405 01/11/24 1048 01/12/24 0307  NA 139 138  --   --  136  --  139  K 4.1 3.9  --   --  4.8  --  4.5  CL 98  --   --   --  96*  --  98  CO2 33*  --   --   --  31  --  36*  GLUCOSE 90  --   --   --  228*  --  85  BUN 11  --   --   --  8  --  11  CREATININE 0.66  --   --   --  0.69  --  0.65  CALCIUM  9.8  --   --   --  8.3*  --  8.8*  AST 21  --   --   --  22  --  15  ALT 13  --    --   --  15  --  14  ALKPHOS 97  --   --   --  77  --  63  BILITOT 0.3  --   --   --  0.6  --  0.2  ALBUMIN 4.0  --   --   --  2.9*  --  2.8*  MG 2.1  --   --   --  2.3  --  2.2  DDIMER  --   --   --   --   --  0.53*  --   PROCALCITON  --   --  <0.10  --   --   --   --   LATICACIDVEN  --   --  2.8* 2.9* 1.7  --   --   TSH  --   --  0.425  --   --   --   --     ------------------------------------------------------------------------------------------------------------------ Recent Labs    01/12/24 0307  CHOL 113  HDL 47  LDLCALC 58  TRIG 40  CHOLHDL 2.4    Lab Results  Component Value Date   HGBA1C 7.6 (H) 08/26/2023   ------------------------------------------------------------------------------------------------------------------ Recent Labs    01/10/24 2324  TSH 0.425    Cardiac Enzymes No results for input(s): CKMB, TROPONINI, MYOGLOBIN in the last 168 hours.  Invalid input(s): CK ------------------------------------------------------------------------------------------------------------------    Component Value Date/Time  BNP 76.1 07/20/2023 0959    CBG: Recent Labs  Lab 01/11/24 0600 01/11/24 1244 01/11/24 1702  GLUCAP 197* 194* 153*    Recent Results (from the past 240 hours)  Respiratory (~20 pathogens) panel by PCR     Status: None   Collection Time: 01/10/24 10:48 PM   Specimen: Nasopharyngeal Swab; Respiratory  Result Value Ref Range Status   Adenovirus NOT DETECTED NOT DETECTED Final   Coronavirus 229E NOT DETECTED NOT DETECTED Final    Comment: (NOTE) The Coronavirus on the Respiratory Panel, DOES NOT test for the novel  Coronavirus (2019 nCoV)    Coronavirus HKU1 NOT DETECTED NOT DETECTED Final   Coronavirus NL63 NOT DETECTED NOT DETECTED Final   Coronavirus OC43 NOT DETECTED NOT DETECTED Final   Metapneumovirus NOT DETECTED NOT DETECTED Final   Rhinovirus / Enterovirus NOT DETECTED NOT DETECTED Final   Influenza A NOT DETECTED  NOT DETECTED Final   Influenza B NOT DETECTED NOT DETECTED Final   Parainfluenza Virus 1 NOT DETECTED NOT DETECTED Final   Parainfluenza Virus 2 NOT DETECTED NOT DETECTED Final   Parainfluenza Virus 3 NOT DETECTED NOT DETECTED Final   Parainfluenza Virus 4 NOT DETECTED NOT DETECTED Final   Respiratory Syncytial Virus NOT DETECTED NOT DETECTED Final   Bordetella pertussis NOT DETECTED NOT DETECTED Final   Bordetella Parapertussis NOT DETECTED NOT DETECTED Final   Chlamydophila pneumoniae NOT DETECTED NOT DETECTED Final   Mycoplasma pneumoniae NOT DETECTED NOT DETECTED Final    Comment: Performed at  [facility], [address], [place], [place] [address]  Resp panel by RT-PCR (RSV, Flu A&B, Covid) Anterior Nasal Swab     Status: None   Collection Time: 01/10/24 10:48 PM   Specimen: Anterior Nasal Swab  Result Value Ref Range Status   SARS Coronavirus 2 by RT PCR NEGATIVE NEGATIVE Final   Influenza A by PCR NEGATIVE NEGATIVE Final   Influenza B by PCR NEGATIVE NEGATIVE Final    Comment: (NOTE) The Xpert Xpress SARS-CoV-2/FLU/RSV plus assay is intended as an aid in the diagnosis of influenza from Nasopharyngeal swab specimens and should not be used as a sole basis for treatment. Nasal washings and aspirates are unacceptable for Xpert Xpress SARS-CoV-2/FLU/RSV testing.  Fact Sheet for Patients: bloggercourse.com  Fact Sheet for Healthcare Providers: seriousbroker.it  This test is not yet approved or cleared by the [place]  FDA and has been authorized for detection and/or diagnosis of SARS-CoV-2 by FDA under an Emergency Use Authorization (EUA). This EUA will remain in effect (meaning this test can be used) for the duration of the COVID-19 declaration under Section 564(b)(1) of the Act, 21 U.S.C. section 360bbb-3(b)(1), unless the authorization is terminated or revoked.     Resp Syncytial Virus by PCR NEGATIVE NEGATIVE  Final    Comment: (NOTE) Fact Sheet for Patients: bloggercourse.com  Fact Sheet for Healthcare Providers: seriousbroker.it  This test is not yet approved or cleared by the [place]  FDA and has been authorized for detection and/or diagnosis of SARS-CoV-2 by FDA under an Emergency Use Authorization (EUA). This EUA will remain in effect (meaning this test can be used) for the duration of the COVID-19 declaration under Section 564(b)(1) of the Act, 21 U.S.C. section 360bbb-3(b)(1), unless the authorization is terminated or revoked.  Performed at [facility], [address], [place], [place] [address]      Radiology Studies: ECHOCARDIOGRAM COMPLETE Result Date: 01/11/2024    ECHOCARDIOGRAM REPORT   Patient Name:  Zakir F Ator Date of Exam: 01/11/2024 Medical Rec #:  [id number]     Height:       65.0 in Accession #:    [id number]    Weight:       145.0 lb Date of Birth:  02-May-1960     BSA:          1.725 m Patient Age:    63 years      BP:           132/80 mmHg Patient Gender: M             HR:           86 bpm. Exam Location:  Inpatient Procedure: 2D Echo (Both Spectral and Color Flow Doppler were utilized during            procedure). Indications:    CP  History:        Patient has prior history of Echocardiogram examinations.                 Signs/Symptoms:Chest Pain.  Sonographer:    Norleen Amour Referring Phys: [phone] NILDA HERO Amando Ishikawa IMPRESSIONS  1. Left ventricular ejection fraction, by estimation, is 55 to 60%. Left ventricular ejection fraction by 2D MOD biplane is 58.9 %. The left ventricle has normal function. The left ventricle has no regional wall motion abnormalities. Left ventricular diastolic parameters are consistent with Grade II diastolic dysfunction (pseudonormalization).  2. Right ventricular systolic function is normal. The right ventricular size is normal. There is moderately elevated pulmonary artery systolic  pressure.  3. Left atrial size was mildly dilated.  4. The mitral valve is normal in structure. No evidence of mitral valve regurgitation. No evidence of mitral stenosis.  5. The aortic valve was not well visualized. Aortic valve regurgitation is not visualized. No aortic stenosis is present.  6. The inferior vena cava is normal in size with greater than 50% respiratory variability, suggesting right atrial pressure of 3 mmHg. Comparison(s): No significant change from prior study. FINDINGS  Left Ventricle: Left ventricular ejection fraction, by estimation, is 55 to 60%. Left ventricular ejection fraction by 2D MOD biplane is 58.9 %. The left ventricle has normal function. The left ventricle has no regional wall motion abnormalities. The left ventricular internal cavity size was normal in size. There is no left ventricular hypertrophy. Left ventricular diastolic parameters are consistent with Grade II diastolic dysfunction (pseudonormalization). Right Ventricle: The right ventricular size is normal. No increase in right ventricular wall thickness. Right ventricular systolic function is normal. There is moderately elevated pulmonary artery systolic pressure. The tricuspid regurgitant velocity is 3.64 m/s, and with an assumed right atrial pressure of 3 mmHg, the estimated right ventricular systolic pressure is 56.0 mmHg. Left Atrium: Left atrial size was mildly dilated. Right Atrium: Right atrial size was normal in size. Pericardium: There is no evidence of pericardial effusion. Mitral Valve: The mitral valve is normal in structure. No evidence of mitral valve regurgitation. No evidence of mitral valve stenosis. MV peak gradient, 2.5 mmHg. The mean mitral valve gradient is 1.0 mmHg. Tricuspid Valve: The tricuspid valve is normal in structure. Tricuspid valve regurgitation is mild . No evidence of tricuspid stenosis. Aortic Valve: The aortic valve was not well visualized. Aortic valve regurgitation is not visualized. No  aortic stenosis is present. Aortic valve mean gradient measures 3.0 mmHg. Aortic valve peak gradient measures 6.2 mmHg. Aortic valve area, by VTI measures 2.96 cm. Pulmonic Valve: The pulmonic valve was  not well visualized. Pulmonic valve regurgitation is not visualized. No evidence of pulmonic stenosis. Aorta: The aortic root is normal in size and structure. Venous: The inferior vena cava is normal in size with greater than 50% respiratory variability, suggesting right atrial pressure of 3 mmHg. IAS/Shunts: No atrial level shunt detected by color flow Doppler.  LEFT VENTRICLE PLAX 2D                        Biplane EF (MOD) LVIDd:         4.60 cm         LV Biplane EF:   Left LVIDs:         2.50 cm                          ventricular LV PW:         1.00 cm                          ejection LV IVS:        0.70 cm                          fraction by LVOT diam:     1.99 cm                          2D MOD LV SV:         66                               biplane is LV SV Index:   38                               58.9 %. LVOT Area:     3.11 cm                                Diastology                                LV e' medial:    7.07 cm/s LV Volumes (MOD)               LV E/e' medial:  8.7 LV vol d, MOD    91.7 ml       LV e' lateral:   9.25 cm/s A2C:                           LV E/e' lateral: 6.7 LV vol d, MOD    88.5 ml A4C: LV vol s, MOD    35.6 ml A2C: LV vol s, MOD    38.2 ml A4C: LV SV MOD A2C:   56.1 ml LV SV MOD A4C:   88.5 ml LV SV MOD BP:    53.7 ml RIGHT VENTRICLE             IVC RV Basal diam:  3.84 cm     IVC diam: 1.36 cm RV S prime:     11.60 cm/s TAPSE (M-mode): 2.1 cm      PULMONARY VEINS  Diastolic Velocity: 62.80 cm/s                             S/D Velocity:       1.40                             Systolic Velocity:  89.80 cm/s LEFT ATRIUM             Index        RIGHT ATRIUM           Index LA diam:        3.04 cm 1.76 cm/m   RA Area:     11.70 cm LA Vol (A2C):    60.6 ml 35.12 ml/m  RA Volume:   26.40 ml  15.30 ml/m LA Vol (A4C):   69.9 ml 40.51 ml/m LA Biplane Vol: 65.3 ml 37.85 ml/m  AORTIC VALVE                    PULMONIC VALVE AV Area (Vmax):    2.33 cm     PV Vmax:       0.83 m/s AV Area (Vmean):   2.34 cm     PV Peak grad:  2.7 mmHg AV Area (VTI):     2.96 cm AV Vmax:           124.00 cm/s AV Vmean:          81.500 cm/s AV VTI:            0.223 m AV Peak Grad:      6.2 mmHg AV Mean Grad:      3.0 mmHg LVOT Vmax:         92.70 cm/s LVOT Vmean:        61.300 cm/s LVOT VTI:          0.212 m LVOT/AV VTI ratio: 0.95  AORTA Ao Root diam: 2.99 cm Ao Asc diam:  2.71 cm MITRAL VALVE               TRICUSPID VALVE MV Area (PHT): 5.93 cm    TR Peak grad:   53.0 mmHg MV Area VTI:   3.81 cm    TR Vmax:        364.00 cm/s MV Peak grad:  2.5 mmHg MV Mean grad:  1.0 mmHg    SHUNTS MV Vmax:       0.79 m/s    Systemic VTI:  0.21 m MV Vmean:      51.0 cm/s   Systemic Diam: 1.99 cm MV Decel Time: 128 msec MV E velocity: 61.80 cm/s MV A velocity: 75.00 cm/s MV E/A ratio:  0.82 Franck Azobou Tonleu Electronically signed by Joelle Cedars Tonleu Signature Date/Time: 01/11/2024/1:21:02 PM    Final      Nilda Fendt, MD, PhD [facility]  Between 7 am - 7 pm I am available, please contact me via Amion (for emergencies) or Securechat (non urgent messages)  Between 7 pm - 7 am I am not available, please contact night coverage MD/APP via Amion

## 2024-01-12 NOTE — TOC CM/SW Note (Signed)
 Call made to the [facility] Centralized System for notification of admission. Spoke with [facility]- confirmed [facility]- Notification ID- [id number] [facility] auth #- [id number]

## 2024-01-12 NOTE — Progress Notes (Signed)
 Pt is pleasant, alert, oriented x 4, afebrile, stable hemodynamically, on 4 LPM, SPO2 98-100%. We wean down to 2 LPM as his home O2 baseline. Pt has been well tolerated. He ambulates well, no SOB. He is able to rest and sleep well with no major complaints overnight. Plan of care is reviewed. Pt has been progressing. We will continue to monitor.  Problem: Clinical Measurements: Goal: Ability to maintain clinical measurements within normal limits will improve Outcome: Progressing Goal: Will remain free from infection Outcome: Progressing Goal: Diagnostic test results will improve Outcome: Progressing Goal: Respiratory complications will improve Outcome: Progressing Goal: Cardiovascular complication will be avoided Outcome: Progressing   Problem: Activity: Goal: Risk for activity intolerance will decrease Outcome: Progressing   Problem: Pain Managment: Goal: General experience of comfort will improve and/or be controlled Outcome: Progressing   Problem: Respiratory: Goal: Ability to maintain a clear airway will improve Outcome: Progressing Goal: Levels of oxygenation will improve Outcome: Progressing Goal: Ability to maintain adequate ventilation will improve Outcome: Progressing   Wendi Dash, RN

## 2024-01-13 LAB — GLUCOSE, CAPILLARY
Glucose-Capillary: 196 mg/dL — ABNORMAL HIGH (ref 70–99)
Glucose-Capillary: 138 mg/dL — ABNORMAL HIGH (ref 70–99)
Glucose-Capillary: 153 mg/dL — ABNORMAL HIGH (ref 70–99)
Glucose-Capillary: 141 mg/dL — ABNORMAL HIGH (ref 70–99)

## 2024-01-13 MED ORDER — SODIUM CHLORIDE 3 % IN NEBU
4.0000 mL | INHALATION_SOLUTION | Freq: Every day | RESPIRATORY_TRACT | Status: DC
  Filled 2024-01-13 (×2): qty 4

## 2024-01-13 MED ORDER — GUAIFENESIN ER 600 MG PO TB12
1200.0000 mg | ORAL_TABLET | Freq: Two times a day (BID) | ORAL | Status: DC
  Administered 2024-01-13 – 2024-01-14 (×3): 1200 mg via ORAL
  Filled 2024-01-13 (×3): qty 2

## 2024-01-13 NOTE — Progress Notes (Signed)
 PROGRESS NOTE  Glenn [name] FMW:[id number] DOB: 10/24/1960 DOA: 01/10/2024 PCP: [facility]   LOS: 2 days   Brief Narrative / Interim history: 63 year old male with history of COPD, DM 2, HTN, hyperlipidemia comes into the hospital with shortness of breath, chest tightness.  He is on 2 L at home.  Patient basically tells me that he has had multiple hospitalizations this year, and he feels like he really does not have a good baseline anymore.  Symptoms have gotten much worse over the last couple of months after a trip in [place].  He states that he he gets very short of breath, wheezy, gets hospitalized, feels better but has found himself more more limited to how much he can walk.  He reports also worsening substernal chest pain, pressure-like, every time he tries to get up and move around.  He does not know any cardiac history that he is aware of, but his chest pain is fairly predictable, happens with ambulation, he becomes short winded and upon stopping his chest pain goes away.  Subjective / 24h Interval events: Remains with main complaint of shortness of breath with exertion, as well as incessant cough every time he takes a deep breath.  He does feel better today than when he first came to the hospital.  Assesement and Plan: Principal problem Acute on chronic hypoxic respiratory failure, underlying COPD with exacerbation -continue steroids, nebulizers, azithromycin  - VBG on admission showed compensated hypercarbia.  Pulmonary was consulted, evaluated patient yesterday, appreciate input. - Main complaint is incessant cough and inability to expectorate, will try hypertonic saline nebulizers today, scheduled guaifenesin .  He tells me that promethazine  is the only thing that works for his cough  Active problems Chest pain -pretty much with every activity, raising concern for a component of angina.  He does have poor respiratory status at baseline but chest pain has some typical  characteristics such as getting worse with activity and resolving with Sublingual nitroglycerin  and rest - Cardiology consulted and evaluated patient while hospitalized, they will start ischemic workup as an outpatient, he has an appointment on January 8  History of CVA-continue aspirin , he apparently is not on Plavix  anymore  Chronic pain-continue [place] and short acting home pain regimen   History of sarcoidosis-do not have a lot of background on this, pulm consulted.  Currently on steroids  Scheduled Meds:  amLODipine   10 mg Oral Daily   arformoterol   15 mcg Nebulization BID   aspirin  EC  81 mg Oral Daily   azithromycin   500 mg Oral Daily   budesonide  (PULMICORT ) nebulizer solution  0.5 mg Nebulization Q12H   feeding supplement  237 mL Oral BID BM   guaiFENesin   1,200 mg Oral BID   hydrOXYzine   25 mg Oral QHS   ipratropium-albuterol   3 mL Nebulization Q6H   montelukast   10 mg Oral QHS   multivitamin with minerals  1 tablet Oral Daily   oxyCODONE   10 mg Oral Q12H   pantoprazole   40 mg Oral Daily   pravastatin   10 mg Oral QHS   prazosin   1 mg Oral QHS   predniSONE   40 mg Oral Q breakfast   sodium chloride  flush  3 mL Intravenous Q12H   sodium chloride  HYPERTONIC  4 mL Nebulization Daily   tamsulosin   0.4 mg Oral Daily   Continuous Infusions:   PRN Meds:.albuterol , fentaNYL  (SUBLIMAZE ) injection, ondansetron  **OR** ondansetron  (ZOFRAN ) IV, oxyCODONE , promethazine , senna-docusate  Current Outpatient Medications  Medication Instructions   albuterol  (  VENTOLIN  HFA) 108 (90 Base) MCG/ACT inhaler 1-2 puffs, Inhalation, Every 6 hours PRN   amLODipine  (NORVASC ) 10 mg, Oral, Daily   aspirin  EC 81 mg, Daily   budesonide -formoterol  (SYMBICORT ) 160-4.5 MCG/ACT inhaler 2 puffs, Inhalation, 2 times daily   clopidogrel  (PLAVIX ) 75 mg, Oral, Daily   docusate sodium  (COLACE) 100 mg, 2 times daily PRN   doxycycline  (VIBRAMYCIN ) 100 mg, Oral, 2 times daily   EPINEPHrine  (EPI-PEN) 0.3 mg, As  needed   famotidine  (PEPCID ) 20 MG tablet One after supper   ferrous sulfate  325 mg, Every M-W-F   fluticasone  (FLONASE ) 50 MCG/ACT nasal spray Place 2 sprays into both nostrils daily for 10 days.   guaiFENesin  (MUCINEX ) 600 mg, Every morning   hydrOXYzine  (ATARAX ) 25 mg, Daily at bedtime   ipratropium-albuterol  (DUONEB) 0.5-2.5 (3) MG/3ML SOLN Take 3 mLs by nebulization 3 (three) times daily for 5 days, THEN 3 mLs every 6 (six) hours as needed for up to 5 days.   loratadine  (CLARITIN ) 10 mg, Oral, Daily   metFORMIN  (GLUCOPHAGE ) 500 mg, Oral, 2 times daily with meals   montelukast  (SINGULAIR ) 10 mg, Daily at bedtime   naloxone  (NARCAN ) nasal spray 4 mg/0.1 mL 1 spray, As needed   oxyCODONE  (OXYCONTIN ) 10 mg, Every 12 hours   Oxycodone  HCl 20 mg, Every 6 hours PRN   OXYGEN  2-3 L/min, As needed   pantoprazole  (PROTONIX ) 40 mg, Oral, Daily   polyethylene glycol (MIRALAX  / GLYCOLAX ) 17 g, Oral, Daily   pravastatin  (PRAVACHOL ) 10 mg, Daily at bedtime   prazosin  (MINIPRESS ) 1 mg, Daily at bedtime   predniSONE  (DELTASONE ) 50 MG tablet One tablet a day for 5 days   promethazine -dextromethorphan  (PROMETHAZINE -DM) 6.25-15 MG/5ML syrup 5 mLs, Oral, 4 times daily PRN   senna-docusate (SENOKOT-S) 8.6-50 MG tablet 1 tablet, Oral, 2 times daily   sildenafil  (VIAGRA ) 100 mg, Oral, Daily PRN   tamsulosin  (FLOMAX ) 0.4 mg, Oral, Daily   Tiotropium Bromide  Monohydrate 2.5 MCG/ACT AERS 2 puffs, Inhalation, Daily   vitamin C (ASCORBIC ACID ) 250 mg, Daily   Vitamin D3 4,000 Units, Daily    Diet Orders (From admission, onward)     Start     Ordered   01/11/24 0947  Diet Carb Modified Fluid consistency: Thin  Diet effective now       Question Answer Comment  Diet-HS Snack? Snack-yes   Calorie Level Medium 1600-2000   Fluid consistency: Thin      01/11/24 0946            DVT prophylaxis: SCDs Start: 01/10/24 2354   Lab Results  Component Value Date   PLT 308 01/12/2024      Code Status:  Full Code  Family Communication: No family at bedside  Status is: Inpatient Remains inpatient appropriate because: Severity of illness   Level of care: Progressive  Consultants:  Cardiology  Objective: Vitals:   01/12/24 2344 01/13/24 0001 01/13/24 0415 01/13/24 0814  BP: (!) 146/87  129/81 (!) 141/84  Pulse: 96 93 86 91  Resp: 15 (!) 21 15 20   Temp: 97.7 F (36.5 C)  98.5 F (36.9 C) 98.1 F (36.7 C)  TempSrc: Oral  Oral Oral  SpO2: 94% 96% 98% 95%  Weight:   62.2 kg   Height:        Intake/Output Summary (Last 24 hours) at 01/13/2024 0950 Last data filed at 01/13/2024 0900 Gross per 24 hour  Intake 720 ml  Output 1200 ml  Net -480 ml   Wt  Readings from Last 3 Encounters:  01/13/24 62.2 kg  12/11/23 65.8 kg  11/09/23 65.1 kg    Examination:  Constitutional: NAD Eyes: lids and conjunctivae normal, no scleral icterus ENMT: mmm Neck: normal, supple Respiratory: clear to auscultation bilaterally, no wheezing, no crackles. Normal respiratory effort.  Cardiovascular: Regular rate and rhythm, no murmurs / rubs / gallops. No LE edema. Abdomen: soft, no distention, no tenderness. Bowel sounds positive.    Data Reviewed: I have independently reviewed following labs and imaging studies   CBC Recent Labs  Lab 01/10/24 1320 01/10/24 1331 01/11/24 0405 01/12/24 0307  WBC 11.6*  --  7.4 12.2*  HGB 12.7* 15.3 11.9* 10.9*  HCT 41.8 45.0 40.1 37.3*  PLT 327  --  312 308  MCV 83.9  --  83.9 85.4  MCH 25.5*  --  24.9* 24.9*  MCHC 30.4  --  29.7* 29.2*  RDW 17.7*  --  17.2* 17.2*  LYMPHSABS 2.3  --   --   --   MONOABS 0.8  --   --   --   EOSABS 0.5  --   --   --   BASOSABS 0.1  --   --   --     Recent Labs  Lab 01/10/24 1320 01/10/24 1331 01/10/24 2324 01/10/24 2346 01/11/24 0405 01/11/24 1048 01/12/24 0307  NA 139 138  --   --  136  --  139  K 4.1 3.9  --   --  4.8  --  4.5  CL 98  --   --   --  96*  --  98  CO2 33*  --   --   --  31  --  36*   GLUCOSE 90  --   --   --  228*  --  85  BUN 11  --   --   --  8  --  11  CREATININE 0.66  --   --   --  0.69  --  0.65  CALCIUM  9.8  --   --   --  8.3*  --  8.8*  AST 21  --   --   --  22  --  15  ALT 13  --   --   --  15  --  14  ALKPHOS 97  --   --   --  77  --  63  BILITOT 0.3  --   --   --  0.6  --  0.2  ALBUMIN 4.0  --   --   --  2.9*  --  2.8*  MG 2.1  --   --   --  2.3  --  2.2  DDIMER  --   --   --   --   --  0.53*  --   PROCALCITON  --   --  <0.10  --   --   --   --   LATICACIDVEN  --   --  2.8* 2.9* 1.7  --   --   TSH  --   --  0.425  --   --   --   --     ------------------------------------------------------------------------------------------------------------------ Recent Labs    01/12/24 0307  CHOL 113  HDL 47  LDLCALC 58  TRIG 40  CHOLHDL 2.4    Lab Results  Component Value Date   HGBA1C 7.6 (H) 08/26/2023   ------------------------------------------------------------------------------------------------------------------ Recent Labs    01/10/24 2324  TSH 0.425  Cardiac Enzymes No results for input(s): CKMB, TROPONINI, MYOGLOBIN in the last 168 hours.  Invalid input(s): CK ------------------------------------------------------------------------------------------------------------------    Component Value Date/Time   BNP 76.1 07/20/2023 0959    CBG: Recent Labs  Lab 01/11/24 1702 01/12/24 1206 01/12/24 1637 01/12/24 2205 01/13/24 0611  GLUCAP 153* 294* 121* 183* 196*    Recent Results (from the past 240 hours)  Respiratory (~20 pathogens) panel by PCR     Status: None   Collection Time: 01/10/24 10:48 PM   Specimen: Nasopharyngeal Swab; Respiratory  Result Value Ref Range Status   Adenovirus NOT DETECTED NOT DETECTED Final   Coronavirus 229E NOT DETECTED NOT DETECTED Final    Comment: (NOTE) The Coronavirus on the Respiratory Panel, DOES NOT test for the novel  Coronavirus (2019 nCoV)    Coronavirus HKU1 NOT DETECTED NOT  DETECTED Final   Coronavirus NL63 NOT DETECTED NOT DETECTED Final   Coronavirus OC43 NOT DETECTED NOT DETECTED Final   Metapneumovirus NOT DETECTED NOT DETECTED Final   Rhinovirus / Enterovirus NOT DETECTED NOT DETECTED Final   Influenza A NOT DETECTED NOT DETECTED Final   Influenza B NOT DETECTED NOT DETECTED Final   Parainfluenza Virus 1 NOT DETECTED NOT DETECTED Final   Parainfluenza Virus 2 NOT DETECTED NOT DETECTED Final   Parainfluenza Virus 3 NOT DETECTED NOT DETECTED Final   Parainfluenza Virus 4 NOT DETECTED NOT DETECTED Final   Respiratory Syncytial Virus NOT DETECTED NOT DETECTED Final   Bordetella pertussis NOT DETECTED NOT DETECTED Final   Bordetella Parapertussis NOT DETECTED NOT DETECTED Final   Chlamydophila pneumoniae NOT DETECTED NOT DETECTED Final   Mycoplasma pneumoniae NOT DETECTED NOT DETECTED Final    Comment: Performed at [facility], [address] [place], [place] [address]  Resp panel by RT-PCR (RSV, Flu A&B, Covid) Anterior Nasal Swab     Status: None   Collection Time: 01/10/24 10:48 PM   Specimen: Anterior Nasal Swab  Result Value Ref Range Status   SARS Coronavirus 2 by RT PCR NEGATIVE NEGATIVE Final   Influenza A by PCR NEGATIVE NEGATIVE Final   Influenza B by PCR NEGATIVE NEGATIVE Final    Comment: (NOTE) The Xpert Xpress SARS-CoV-2/FLU/RSV plus assay is intended as an aid in the diagnosis of influenza from Nasopharyngeal swab specimens and should not be used as a sole basis for treatment. Nasal washings and aspirates are unacceptable for Xpert Xpress SARS-CoV-2/FLU/RSV testing.  Fact Sheet for Patients: bloggercourse.com  Fact Sheet for Healthcare Providers: seriousbroker.it  This test is not yet approved or cleared by the [place]  FDA and has been authorized for detection and/or diagnosis of SARS-CoV-2 by FDA under an Emergency Use Authorization (EUA). This EUA will remain in  effect (meaning this test can be used) for the duration of the COVID-19 declaration under Section 564(b)(1) of the Act, 21 U.S.C. section 360bbb-3(b)(1), unless the authorization is terminated or revoked.     Resp Syncytial Virus by PCR NEGATIVE NEGATIVE Final    Comment: (NOTE) Fact Sheet for Patients: bloggercourse.com  Fact Sheet for Healthcare Providers: seriousbroker.it  This test is not yet approved or cleared by the [place]  FDA and has been authorized for detection and/or diagnosis of SARS-CoV-2 by FDA under an Emergency Use Authorization (EUA). This EUA will remain in effect (meaning this test can be used) for the duration of the COVID-19 declaration under Section 564(b)(1) of the Act, 21 U.S.C. section 360bbb-3(b)(1), unless the authorization is terminated or revoked.  Performed at [facility]  [facility], [address], [place], [place] [address]      Radiology Studies: No results found.    Nilda Fendt, MD, PhD [facility]  Between 7 am - 7 pm I am available, please contact me via Amion (for emergencies) or Securechat (non urgent messages)  Between 7 pm - 7 am I am not available, please contact night coverage MD/APP via Amion

## 2024-01-13 NOTE — Progress Notes (Signed)
 Pt. Stated he is not able to cough up any phlegm at this time. Cup was place on table in fron of the pt. And suggestion made

## 2024-01-13 NOTE — Progress Notes (Addendum)
 NAME:  Glenn [name], MRN:  [id number], DOB:  [birth year], LOS: 2 ADMISSION DATE:  01/10/2024, CONSULTATION DATE:  01/12/2024 REFERRING MD:  Dr Trixie, CHIEF COMPLAINT:  COPD   History of Present Illness:  Glenn [name] is a 63 year old male with history of COPD, sarcoidosis, prior stroke, diabetes mellitus, coronary artery calcification, who was admitted for chest pain and progressive dyspnea on exertion.  Cardiology was consulted for possible underlying unstable angina. Pulmonary was consulted at the request of cardiology for possible pulmonary lesions of worsening dyspnea and likely COPD exacerbation  He follows with Dr. Darlean in pulmonary clinic.  As per his notes, patient has history of bilateral lung surgery?  Volume reduction surgery.  He has prior history of asthma.  PFT's  08/11/2020  FEV1 1.34 (54 % ) ratio 0.61  p 10 % improvement from albuterol  DLCO  11.94 (52%) corrects to 3.41 (77%)  for alv volume   Pertinent  Medical History  COPD Sarcoidosis asthma  Significant Hospital Events: Including procedures, antibiotic start and stop dates in addition to other pertinent events   Admitted to [facility] 12/04 Pulm ccm consulted  Interim History / Subjective:  Pt is on [place] saturating 93% Currently on 2 L, which is his home dose Eating lunch at the time of exam   Objective   Blood pressure (!) 144/86, pulse 100, temperature 97.6 F (36.4 C), temperature source Axillary, resp. rate 20, height 5' 5 (1.651 m), weight 62.2 kg, SpO2 95%.    FiO2 (%):  [28 %] 28 %   Intake/Output Summary (Last 24 hours) at 01/13/2024 1201 Last data filed at 01/13/2024 1132 Gross per 24 hour  Intake 960 ml  Output 1800 ml  Net -840 ml   Filed Weights   01/12/24 0500 01/13/24 0415  Weight: 65.1 kg 62.2 kg    Examination: General:  Pleasant man Decreased bilateral breath sounds  Lab/imaging review: ABG 12/0/2025 7.3 1/70/85/ 97% sat  Eosinophil 500 at admission Absolute eosinophil count as high as  600 in February/2024 Prior allergy  labs revealed IgE to cockroaches  Echo 01/2024  1. Left ventricular ejection fraction, by estimation, is 55 to 60%. Left  ventricular ejection fraction by 2D MOD biplane is 58.9 %. The left  ventricle has normal function. The left ventricle has no regional wall  motion abnormalities. Left ventricular  diastolic parameters are consistent with Grade II diastolic dysfunction  (pseudonormalization).   2. Right ventricular systolic function is normal. The right ventricular  size is normal. There is moderately elevated pulmonary artery systolic  pressure.   3. Left atrial size was mildly dilated.   4. The mitral valve is normal in structure. No evidence of mitral valve  regurgitation. No evidence of mitral stenosis.   5. The aortic valve was not well visualized. Aortic valve regurgitation  is not visualized. No aortic stenosis is present.   6. The inferior vena cava is normal in size with greater than 50%  respiratory variability, suggesting right atrial pressure of 3 mmHg.   CT chest angio 01/10/2024 Elevated left hemidiaphragm stable. Probable bilateral lung scarring is noted. Emphysematous disease is noted predominantly in the upper lobes. Minimal left basilar subsegmental atelectasis may be present. No focal consolidation or pulmonary edema. No pleural effusion or pneumothorax.  Resolved Hospital Problem list   N/a  Assessment & Plan:   COPD exacerbation Progressive dyspnea Pulmonary hypertension, likely group 3 from severe COPD ?  History of sarcoidosis/history of prior lung surgery- not on any therapy  currently History of asthma,  allergies to cockroaches on prior labs  - Agree with steroids, ICS/LAMA/LAMA nebulizer - Agree with empiric azithromycin .  No obvious pneumonia on CT chest, chronic scarring - pt reports worsened cough since pneumonia a month ago. He reports cough worsened when anticough medications were completed - Respiratory  panel negative - Has had elevated eosinophils, may be a candidate for antieosinophilic agents outpatient like Dupixent for severe COPD, COPD/asthma overlap syndrome.  - agree with steroids- please taper over 7 days I explained to the patient that his CT chest findings are chronic and also reviewed his PFT from 2022 which show severe obstruction and severe restriction with TLC 54 and reduced diffusion capacity Reports his cough is improving since admit  Pulm clinic follow up in 2-3 weeks  Will follow peripherally  Labs   CBC: Recent Labs  Lab 01/10/24 1320 01/10/24 1331 01/11/24 0405 01/12/24 0307  WBC 11.6*  --  7.4 12.2*  NEUTROABS 7.9*  --   --   --   HGB 12.7* 15.3 11.9* 10.9*  HCT 41.8 45.0 40.1 37.3*  MCV 83.9  --  83.9 85.4  PLT 327  --  312 308    Basic Metabolic Panel: Recent Labs  Lab 01/10/24 1320 01/10/24 1331 01/10/24 2324 01/11/24 0405 01/12/24 0307  NA 139 138  --  136 139  K 4.1 3.9  --  4.8 4.5  CL 98  --   --  96* 98  CO2 33*  --   --  31 36*  GLUCOSE 90  --   --  228* 85  BUN 11  --   --  8 11  CREATININE 0.66  --   --  0.69 0.65  CALCIUM  9.8  --   --  8.3* 8.8*  MG 2.1  --   --  2.3 2.2  PHOS  --   --  3.1 2.9 3.5   GFR: Estimated Creatinine Clearance: 81.1 mL/min (by C-G formula based on SCr of 0.65 mg/dL). Recent Labs  Lab 01/10/24 1320 01/10/24 2324 01/10/24 2346 01/11/24 0405 01/12/24 0307  PROCALCITON  --  <0.10  --   --   --   WBC 11.6*  --   --  7.4 12.2*  LATICACIDVEN  --  2.8* 2.9* 1.7  --     Liver Function Tests: Recent Labs  Lab 01/10/24 1320 01/11/24 0405 01/12/24 0307  AST 21 22 15   ALT 13 15 14   ALKPHOS 97 77 63  BILITOT 0.3 0.6 0.2  PROT 7.7 6.7 6.2*  ALBUMIN 4.0 2.9* 2.8*   No results for input(s): LIPASE, AMYLASE in the last 168 hours. No results for input(s): AMMONIA in the last 168 hours.  ABG    Component Value Date/Time   HCO3 35.2 (H) 01/11/2024 0409   TCO2 38 (H) 01/10/2024 1331   O2SAT  97.6 01/11/2024 0409     Coagulation Profile: No results for input(s): INR, PROTIME in the last 168 hours.  Cardiac Enzymes: Recent Labs  Lab 01/10/24 2324  CKTOTAL 246    HbA1C: Hgb A1c MFr Bld  Date/Time Value Ref Range Status  08/26/2023 06:37 PM 7.6 (H) 4.8 - 5.6 % Final    Comment:    (NOTE) Diagnosis of Diabetes The following HbA1c ranges recommended by the American Diabetes Association (ADA) may be used as an aid in the diagnosis of diabetes mellitus.  Hemoglobin             Suggested A1C  NGSP%              Diagnosis  <5.7                   Non Diabetic  5.7-6.4                Pre-Diabetic  >6.4                   Diabetic  <7.0                   Glycemic control for                       adults with diabetes.    01/10/2022 12:34 PM 7.5 (H) 4.8 - 5.6 % Final    Comment:    (NOTE)         Prediabetes: 5.7 - 6.4         Diabetes: >6.4         Glycemic control for adults with diabetes: <7.0     CBG: Recent Labs  Lab 01/12/24 1206 01/12/24 1637 01/12/24 2205 01/13/24 0611 01/13/24 1133  GLUCAP 294* 121* 183* 196* 138*    Review of Systems:   Negative except mentioned in HPI  Past Medical History:  He,  has a past medical history of Anxiety, Arthritis, Asthma, Chronic pain (05/06/2020), COPD (chronic obstructive pulmonary disease) (HCC), CVA (cerebral vascular accident) (HCC), Depression, Diabetes mellitus type 2 in nonobese (HCC), Dyspnea, ED (erectile dysfunction), Hyperlipidemia, Hypertension, Sarcoid, and Tobacco dependence (05/06/2020).   Surgical History:   Past Surgical History:  Procedure Laterality Date   LUNG SURGERY     removed tissue     Social History:   reports that he quit smoking about 3 years ago. His smoking use included cigarettes. He started smoking about 37 years ago. He has a 34 pack-year smoking history. He has never been exposed to tobacco smoke. He has never used smokeless tobacco. He reports that he does not drink  alcohol and does not use drugs.   Family History:  His family history includes Diabetes in his maternal grandmother; Hypertension in his mother. There is no history of Colon cancer, Esophageal cancer, Rectal cancer, Inflammatory bowel disease, Liver disease, or Pancreatic cancer.   Allergies Allergies  Allergen Reactions   Insulins Swelling and Other (See Comments)    04/2020. Do not use Novolog  insulin . Swelling of tongue   Porcine (Pork) Protein-Containing Drug Products Nausea And Vomiting and Other (See Comments)    Cultural-Muslim and NOT TOLERATED, AS WELL   Acetaminophen  Nausea Only and Other (See Comments)    Abdominal sensitivity and Abdominal discomfort   Ibuprofen Other (See Comments)    Abdominal sensitivity/Abdominal discomfort   Lisinopril Swelling, Rash and Other (See Comments)    Tongue swelling   Shrimp [Shellfish Allergy ] Itching   Insulin  Aspart Swelling and Other (See Comments)    Tongue swelling   Amoxicillin -Pot Clavulanate Nausea And Vomiting     Home Medications  Prior to Admission medications   Medication Sig Start Date End Date Taking? Authorizing Provider  albuterol  (VENTOLIN  HFA) 108 (90 Base) MCG/ACT inhaler Inhale 1-2 puffs into the lungs every 6 (six) hours as needed for wheezing or shortness of breath. Patient taking differently: Inhale 2 puffs into the lungs every 6 (six) hours as needed for shortness of breath. 08/29/23  Yes Sebastian Toribio GAILS, MD  amLODipine  (NORVASC ) 10 MG tablet Take 1 tablet (10 mg  total) by mouth daily. 10/03/21  Yes Rai, Ripudeep K, MD  aspirin  EC 81 MG tablet Take 81 mg by mouth daily. Swallow whole.   Yes [provider]  budesonide -formoterol  (SYMBICORT ) 160-4.5 MCG/ACT inhaler Inhale 2 puffs into the lungs 2 (two) times daily. 08/29/23  Yes Sebastian Toribio GAILS, MD  Cholecalciferol (VITAMIN D3) 50 MCG (2000 UT) CHEW Chew 4,000 Units by mouth daily.   Yes [provider]  docusate sodium  (COLACE) 100 MG capsule  Take 100 mg by mouth 2 (two) times daily as needed for mild constipation.   Yes [provider]  doxycycline  (VIBRAMYCIN ) 100 MG capsule Take 1 capsule (100 mg total) by mouth 2 (two) times daily. 12/11/23  Yes Sofia, Leslie K, PA-C  famotidine  (PEPCID ) 20 MG tablet One after supper Patient taking differently: Take 20 mg by mouth daily as needed for heartburn or indigestion. 11/22/21  Yes Darlean Ozell NOVAK, MD  ferrous sulfate  325 (65 FE) MG tablet Take 325 mg by mouth every Monday, Wednesday, and Friday.   Yes [provider]  fluticasone  (FLONASE ) 50 MCG/ACT nasal spray Place 2 sprays into both nostrils daily for 10 days. Patient taking differently: Place 2 sprays into both nostrils daily as needed for allergies or rhinitis (or seasonal allergies). 08/30/23  Yes Sebastian Toribio GAILS, MD  guaiFENesin  (MUCINEX ) 600 MG 12 hr tablet Take 600 mg by mouth in the morning.   Yes [provider]  hydrOXYzine  (ATARAX ) 25 MG tablet Take 25 mg by mouth at bedtime. 08/08/23  Yes [provider]  metFORMIN  (GLUCOPHAGE ) 500 MG tablet Take 1 tablet (500 mg total) by mouth 2 (two) times daily with a meal. Patient taking differently: Take 500 mg by mouth 2 (two) times daily before a meal. 10/03/21  Yes Rai, Ripudeep K, MD  montelukast  (SINGULAIR ) 10 MG tablet Take 10 mg by mouth at bedtime.   Yes [provider]  oxyCODONE  (OXYCONTIN ) 10 mg 12 hr tablet Take 10 mg by mouth every 12 (twelve) hours.   Yes [provider]  Oxycodone  HCl 20 MG TABS Take 20 mg by mouth every 6 (six) hours as needed (pain).   Yes [provider]  OXYGEN  Inhale 2-3 L/min into the lungs as needed (for shortness of breath when exerted).   Yes [provider]  pantoprazole  (PROTONIX ) 40 MG tablet Take 1 tablet (40 mg total) by mouth daily. 07/02/19  Yes Elgergawy, Brayton RAMAN, MD  polyethylene glycol (MIRALAX  / GLYCOLAX ) 17 g packet Take 17 g by mouth daily. Patient taking  differently: Take 17 g by mouth daily as needed for moderate constipation. 08/30/23  Yes Sebastian Toribio GAILS, MD  pravastatin  (PRAVACHOL ) 10 MG tablet Take 10 mg by mouth at bedtime.   Yes [provider]  prazosin  (MINIPRESS ) 1 MG capsule Take 1 mg by mouth at bedtime.   Yes [provider]  senna-docusate (SENOKOT-S) 8.6-50 MG tablet Take 1 tablet by mouth 2 (two) times daily. Patient taking differently: Take 1 tablet by mouth 2 (two) times daily as needed for mild constipation or moderate constipation. 08/29/23  Yes Sebastian Toribio GAILS, MD  sildenafil  (VIAGRA ) 100 MG tablet Take 1 tablet (100 mg total) by mouth daily as needed. Patient taking differently: Take 100 mg by mouth daily as needed for erectile dysfunction. 09/28/23  Yes   tamsulosin  (FLOMAX ) 0.4 MG CAPS capsule Take 1 capsule (0.4 mg total) by mouth daily. 08/29/23  Yes Sebastian Toribio GAILS, MD  Tiotropium Bromide  Monohydrate 2.5  MCG/ACT AERS Inhale 2 puffs into the lungs daily.   Yes [provider]  vitamin C (ASCORBIC ACID ) 250 MG tablet Take 250 mg by mouth daily.   Yes [provider]  clopidogrel  (PLAVIX ) 75 MG tablet Take 1 tablet (75 mg total) by mouth daily. Patient not taking: Reported on 10/24/2023 08/30/23   Sebastian Toribio GAILS, MD  EPINEPHrine  0.3 mg/0.3 mL IJ SOAJ injection Inject 0.3 mg into the muscle as needed for anaphylaxis. 08/08/23   [provider]  ipratropium-albuterol  (DUONEB) 0.5-2.5 (3) MG/3ML SOLN Take 3 mLs by nebulization 3 (three) times daily for 5 days, THEN 3 mLs every 6 (six) hours as needed for up to 5 days. Patient taking differently: Nebulize 3 ml's and inhale into the lungs 4-5 times a day 08/29/23 11/09/23  Sebastian Toribio GAILS, MD  loratadine  (CLARITIN ) 10 MG tablet Take 1 tablet (10 mg total) by mouth daily. Patient not taking: Reported on 10/25/2023 01/03/22     naloxone  (NARCAN ) nasal spray 4 mg/0.1 mL Place 1 spray into the nose as needed (opioid overdose -  respiratory distress). 08/20/21   [provider]  predniSONE  (DELTASONE ) 50 MG tablet One tablet a day for 5 days Patient not taking: Reported on 01/10/2024 12/11/23   Flint Sonny POUR, PA-C  promethazine -dextromethorphan  (PROMETHAZINE -DM) 6.25-15 MG/5ML syrup Take 5 mLs by mouth 4 (four) times daily as needed for cough. Patient not taking: Reported on 01/10/2024 12/11/23   Flint Sonny POUR, PA-C     Total time: N/A    Thank you for the opportunity to take part in the care of this patient  Please call us  with questions  Marica Trentham Pleas, MD 01/13/24 12:01 PM [facility]  For contact information, see Amion. If no response to pager, please call PCCM consult pager. After hours, 7PM- 7AM, please call Elink.

## 2024-01-14 LAB — GLUCOSE, CAPILLARY
Glucose-Capillary: 115 mg/dL — ABNORMAL HIGH (ref 70–99)
Glucose-Capillary: 301 mg/dL — ABNORMAL HIGH (ref 70–99)

## 2024-01-14 LAB — LIPOPROTEIN A (LPA): Lipoprotein (a): 55.6 nmol/L — ABNORMAL HIGH (ref <75.0)

## 2024-01-14 MED ORDER — PROMETHAZINE-DM 6.25-15 MG/5ML PO SYRP: 5.0000 mL | ORAL_SOLUTION | Freq: Four times a day (QID) | ORAL | 2 refills | Status: DC | PRN

## 2024-01-14 MED ORDER — PREDNISONE 10 MG PO TABS: ORAL_TABLET | ORAL | 0 refills | Status: AC

## 2024-01-14 MED ORDER — AZITHROMYCIN 500 MG PO TABS: 500.0000 mg | ORAL_TABLET | Freq: Every day | ORAL | 0 refills | Status: AC

## 2024-01-14 NOTE — Discharge Instructions (Signed)
 Follow with Clinic, [facility] in 5-7 days Follow up with Dr Darlean in 1-2 weeks  Please get a complete blood count and chemistry panel checked by your Primary MD at your next visit, and again as instructed by your Primary MD. Please get your medications reviewed and adjusted by your Primary MD.  Please request your Primary MD to go over all Hospital Tests and Procedure/Radiological results at the follow up, please get all Hospital records sent to your Prim MD by signing hospital release before you go home.  In some cases, there will be blood work, cultures and biopsy results pending at the time of your discharge. Please request that your primary care M.D. goes through all the records of your hospital data and follows up on these results.  If you had Pneumonia of Lung problems at the Hospital: Please get a 2 view Chest X ray done in 6-8 weeks after hospital discharge or sooner if instructed by your Primary MD.  If you have Congestive Heart Failure: Please call your Cardiologist or Primary MD anytime you have any of the following symptoms:  1) 3 pound weight gain in 24 hours or 5 pounds in 1 week  2) shortness of breath, with or without a dry hacking cough  3) swelling in the hands, feet or stomach  4) if you have to sleep on extra pillows at night in order to breathe  Follow cardiac low salt diet and 1.5 lit/day fluid restriction.  If you have diabetes Accuchecks 4 times/day, Once in AM empty stomach and then before each meal. Log in all results and show them to your primary doctor at your next visit. If any glucose reading is under 80 or above 300 call your primary MD immediately.  If you have Seizure/Convulsions/Epilepsy: Please do not drive, operate heavy machinery, participate in activities at heights or participate in high speed sports until you have seen by Primary MD or a Neurologist and advised to do so again. Per [place]  DMV statutes, patients with seizures are not  allowed to drive until they have been seizure-free for six months.  Use caution when using heavy equipment or power tools. Avoid working on ladders or at heights. Take showers instead of baths. Ensure the water temperature is not too high on the home water heater. Do not go swimming alone. Do not lock yourself in a room alone (i.e. bathroom). When caring for infants or small children, sit down when holding, feeding, or changing them to minimize risk of injury to the child in the event you have a seizure. Maintain good sleep hygiene. Avoid alcohol.   If you had Gastrointestinal Bleeding: Please ask your Primary MD to check a complete blood count within one week of discharge or at your next visit. Your endoscopic/colonoscopic biopsies that are pending at the time of discharge, will also need to followed by your Primary MD.  Get Medicines reviewed and adjusted. Please take all your medications with you for your next visit with your Primary MD  Please request your Primary MD to go over all hospital tests and procedure/radiological results at the follow up, please ask your Primary MD to get all Hospital records sent to his/her office.  If you experience worsening of your admission symptoms, develop shortness of breath, life threatening emergency, suicidal or homicidal thoughts you must seek medical attention immediately by calling 911 or calling your MD immediately  if symptoms less severe.  You must read complete instructions/literature along with all the possible adverse  reactions/side effects for all the Medicines you take and that have been prescribed to you. Take any new Medicines after you have completely understood and accpet all the possible adverse reactions/side effects.   Do not drive or operate heavy machinery when taking Pain medications.   Do not take more than prescribed Pain, Sleep and Anxiety Medications  Special Instructions: If you have smoked or chewed Tobacco  in the last 2 yrs  please stop smoking, stop any regular Alcohol  and or any Recreational drug use.  Wear Seat belts while driving.  Please note You were cared for by a hospitalist during your hospital stay. If you have any questions about your discharge medications or the care you received while you were in the hospital after you are discharged, you can call the unit and asked to speak with the hospitalist on call if the hospitalist that took care of you is not available. Once you are discharged, your primary care physician will handle any further medical issues. Please note that NO REFILLS for any discharge medications will be authorized once you are discharged, as it is imperative that you return to your primary care physician (or establish a relationship with a primary care physician if you do not have one) for your aftercare needs so that they can reassess your need for medications and monitor your lab values.  You can reach the hospitalist office at phone [phone] or fax [phone]   If you do not have a primary care physician, you can call [phone] for a physician referral.  Activity: As tolerated with Full fall precautions use walker/cane & assistance as needed    Diet: regular  Disposition Home

## 2024-01-14 NOTE — Discharge Summary (Signed)
 Physician Discharge Summary  Glenn [name] FMW:[id number] DOB: 02-Dec-1960 DOA: 01/10/2024  PCP: [facility]  Admit date: 01/10/2024 Discharge date: 01/14/2024  Admitted From: home Disposition:  home  Recommendations for Outpatient Follow-up:  Follow up with PCP in 1-2 weeks Follow-up with pulmonology as an outpatient in the next 1 to 2 weeks  Home Health: none Equipment/Devices: none  Discharge Condition: stable CODE STATUS: Full code Diet Orders (From admission, onward)     Start     Ordered   01/11/24 0947  Diet Carb Modified Fluid consistency: Thin  Diet effective now       Question Answer Comment  Diet-HS Snack? Snack-yes   Calorie Level Medium 1600-2000   Fluid consistency: Thin      01/11/24 0946            Brief Narrative / Interim history: 63 year old male with history of COPD, DM 2, HTN, hyperlipidemia comes into the hospital with shortness of breath, chest tightness.  He is on 2 L at home.  Patient basically tells me that he has had multiple hospitalizations this year, and he feels like he really does not have a good baseline anymore.  Symptoms have gotten much worse over the last couple of months after a trip in [place].  He states that he he gets very short of breath, wheezy, gets hospitalized, feels better but has found himself more more limited to how much he can walk.  He reports also worsening substernal chest pain, pressure-like, every time he tries to get up and move around.  He does not know any cardiac history that he is aware of, but his chest pain is fairly predictable, happens with ambulation, he becomes short winded and upon stopping his chest pain goes away.  Hospital Course / Discharge diagnoses: Principal problem Acute on chronic hypoxic respiratory failure, underlying COPD with exacerbation -patient was admitted to the hospital with COPD exacerbation.  He was placed on steroids, nebulizers, azithromycin  with improvement in his  respiratory symptoms, he is now feeling back to baseline.  Pulmonary was consulted and evaluated patient as well.  Patient has been experiencing significant worsening of his cough mostly in the past month, which has been extremely difficult to manage and contributing to significant worsening of his dyspnea on exertion.  He feels like promethazine  has been working really well for him here and in the past.  With improvement will be discharged home in stable condition, was advised to follow-up with Dr. Neomi with pulmonology as well as [facility].  He is to finish a prednisone  taper and couple more days of azithromycin   Active problems Chest pain -pretty much with every activity, raising concern for a component of angina.  He does have poor respiratory status at baseline but chest pain has some typical characteristics such as getting worse with activity and resolving with Sublingual nitroglycerin  and rest. Cardiology consulted and evaluated patient while hospitalized, they will start ischemic workup as an outpatient, he has an appointment on January 8 History of CVA-continue aspirin , he apparently is not on Plavix  anymore but defer to outpatient MDs Chronic pain-continue Shaikh and short acting home pain regimen  History of sarcoidosis - Currently on steroids. Outpatient follow up   Sepsis ruled out   Discharge Instructions   Allergies as of 01/14/2024       Reactions   Insulins Swelling, Other (See Comments)   04/2020. Do not use Novolog  insulin . Swelling of tongue   Porcine (pork) Protein-containing  Drug Products Nausea And Vomiting, Other (See Comments)   Cultural-Muslim and NOT TOLERATED, AS WELL   Acetaminophen  Nausea Only, Other (See Comments)   Abdominal sensitivity and Abdominal discomfort   Ibuprofen Other (See Comments)   Abdominal sensitivity/Abdominal discomfort   Lisinopril Swelling, Rash, Other (See Comments)   Tongue swelling   Shrimp [shellfish Allergy ] Itching    Insulin  Aspart Swelling, Other (See Comments)   Tongue swelling   Amoxicillin -pot Clavulanate Nausea And Vomiting        Medication List     STOP taking these medications    doxycycline  100 MG capsule Commonly known as: VIBRAMYCIN        TAKE these medications    amLODipine  10 MG tablet Commonly known as: NORVASC  Take 1 tablet (10 mg total) by mouth daily. What changed:  when to take this additional instructions   aspirin  EC 81 MG tablet Take 81 mg by mouth daily. Swallow whole.   azithromycin  500 MG tablet Commonly known as: ZITHROMAX  Take 1 tablet (500 mg total) by mouth daily for 2 days.   clopidogrel  75 MG tablet Commonly known as: PLAVIX  Take 1 tablet (75 mg total) by mouth daily.   docusate sodium  100 MG capsule Commonly known as: COLACE Take 100 mg by mouth 2 (two) times daily as needed for mild constipation.   EPINEPHrine  0.3 mg/0.3 mL Soaj injection Commonly known as: EPI-PEN Inject 0.3 mg into the muscle as needed for anaphylaxis.   famotidine  20 MG tablet Commonly known as: Pepcid  One after supper What changed:  how much to take how to take this when to take this reasons to take this additional instructions   ferrous sulfate  325 (65 FE) MG tablet Take 325 mg by mouth every Monday, Wednesday, and Friday.   fluticasone  50 MCG/ACT nasal spray Commonly known as: FLONASE  Place 2 sprays into both nostrils daily for 10 days. What changed:  when to take this reasons to take this   guaiFENesin  600 MG 12 hr tablet Commonly known as: MUCINEX  Take 600 mg by mouth in the morning.   hydrOXYzine  25 MG tablet Commonly known as: ATARAX  Take 25 mg by mouth at bedtime.   ipratropium-albuterol  0.5-2.5 (3) MG/3ML Soln Commonly known as: DUONEB Take 3 mLs by nebulization 3 (three) times daily for 5 days, THEN 3 mLs every 6 (six) hours as needed for up to 5 days. Start taking on: August 29, 2023 What changed: See the new instructions.   loratadine  10  MG tablet Commonly known as: Claritin  Take 1 tablet (10 mg total) by mouth daily.   metFORMIN  500 MG tablet Commonly known as: GLUCOPHAGE  Take 1 tablet (500 mg total) by mouth 2 (two) times daily with a meal. What changed: when to take this   montelukast  10 MG tablet Commonly known as: SINGULAIR  Take 10 mg by mouth at bedtime.   naloxone  4 MG/0.1ML Liqd nasal spray kit Commonly known as: NARCAN  Place 1 spray into the nose as needed (opioid overdose - respiratory distress).   OxyCONTIN  10 mg 12 hr tablet Generic drug: oxyCODONE  Take 10 mg by mouth every 12 (twelve) hours.   Oxycodone  HCl 20 MG Tabs Take 20 mg by mouth every 6 (six) hours as needed (pain).   OXYGEN  Inhale 2-3 L/min into the lungs as needed (for shortness of breath when exerted).   pantoprazole  40 MG tablet Commonly known as: PROTONIX  Take 1 tablet (40 mg total) by mouth daily.   polyethylene glycol 17 g packet Commonly known as: MIRALAX  /  GLYCOLAX  Take 17 g by mouth daily. What changed:  when to take this reasons to take this   pravastatin  10 MG tablet Commonly known as: PRAVACHOL  Take 10 mg by mouth at bedtime.   prazosin  1 MG capsule Commonly known as: MINIPRESS  Take 1 mg by mouth at bedtime.   predniSONE  10 MG tablet Commonly known as: DELTASONE  Take 4 tablets (40 mg total) by mouth daily with breakfast for 3 days, THEN 3 tablets (30 mg total) daily with breakfast for 3 days, THEN 2 tablets (20 mg total) daily with breakfast for 3 days, THEN 1 tablet (10 mg total) daily with breakfast for 3 days. Start taking on: January 15, 2024 What changed:  medication strength See the new instructions.   promethazine -dextromethorphan  6.25-15 MG/5ML syrup Commonly known as: PROMETHAZINE -DM Take 5 mLs by mouth 4 (four) times daily as needed for cough.   senna-docusate 8.6-50 MG tablet Commonly known as: Senokot-S Take 1 tablet by mouth 2 (two) times daily. What changed:  when to take this reasons to  take this   sildenafil  100 MG tablet Commonly known as: VIAGRA  Take 1 tablet (100 mg total) by mouth daily as needed. What changed: reasons to take this   Symbicort  160-4.5 MCG/ACT inhaler Generic drug: budesonide -formoterol  Inhale 2 puffs into the lungs 2 (two) times daily.   tamsulosin  0.4 MG Caps capsule Commonly known as: FLOMAX  Take 1 capsule (0.4 mg total) by mouth daily.   Tiotropium Bromide  Monohydrate 2.5 MCG/ACT Aers Inhale 2 puffs into the lungs daily.   Ventolin  HFA 108 (90 Base) MCG/ACT inhaler Generic drug: albuterol  Inhale 1-2 puffs into the lungs every 6 (six) hours as needed for wheezing or shortness of breath. What changed:  how much to take reasons to take this   vitamin C 250 MG tablet Commonly known as: ASCORBIC ACID  Take 250 mg by mouth daily.   Vitamin D3 50 MCG (2000 UT) Chew Chew 4,000 Units by mouth daily.        Follow-up Information     Darlean Ozell NOVAK, MD Follow up in 2 week(s).   Specialty: Pulmonary Disease Contact information: [address] [place] [address] [phone]                 Consultations: Cardiology  Pulmonary   Procedures/Studies:  ECHOCARDIOGRAM COMPLETE Result Date: 01/11/2024    ECHOCARDIOGRAM REPORT   Patient Name:   CAROLL CUNNINGTON Burcher Date of Exam: 01/11/2024 Medical Rec #:  [id number]     Height:       65.0 in Accession #:    [id number]    Weight:       145.0 lb Date of Birth:  1960-02-21     BSA:          1.725 m Patient Age:    63 years      BP:           132/80 mmHg Patient Gender: M             HR:           86 bpm. Exam Location:  Inpatient Procedure: 2D Echo (Both Spectral and Color Flow Doppler were utilized during            procedure). Indications:    CP  History:        Patient has prior history of Echocardiogram examinations.                 Signs/Symptoms:Chest Pain.  Sonographer:    Norleen Amour Referring Phys: [id number] NILDA HERO Koki Buxton IMPRESSIONS  1. Left ventricular ejection fraction,  by estimation, is 55 to 60%. Left ventricular ejection fraction by 2D MOD biplane is 58.9 %. The left ventricle has normal function. The left ventricle has no regional wall motion abnormalities. Left ventricular diastolic parameters are consistent with Grade II diastolic dysfunction (pseudonormalization).  2. Right ventricular systolic function is normal. The right ventricular size is normal. There is moderately elevated pulmonary artery systolic pressure.  3. Left atrial size was mildly dilated.  4. The mitral valve is normal in structure. No evidence of mitral valve regurgitation. No evidence of mitral stenosis.  5. The aortic valve was not well visualized. Aortic valve regurgitation is not visualized. No aortic stenosis is present.  6. The inferior vena cava is normal in size with greater than 50% respiratory variability, suggesting right atrial pressure of 3 mmHg. Comparison(s): No significant change from prior study. FINDINGS  Left Ventricle: Left ventricular ejection fraction, by estimation, is 55 to 60%. Left ventricular ejection fraction by 2D MOD biplane is 58.9 %. The left ventricle has normal function. The left ventricle has no regional wall motion abnormalities. The left ventricular internal cavity size was normal in size. There is no left ventricular hypertrophy. Left ventricular diastolic parameters are consistent with Grade II diastolic dysfunction (pseudonormalization). Right Ventricle: The right ventricular size is normal. No increase in right ventricular wall thickness. Right ventricular systolic function is normal. There is moderately elevated pulmonary artery systolic pressure. The tricuspid regurgitant velocity is 3.64 m/s, and with an assumed right atrial pressure of 3 mmHg, the estimated right ventricular systolic pressure is 56.0 mmHg. Left Atrium: Left atrial size was mildly dilated. Right Atrium: Right atrial size was normal in size. Pericardium: There is no evidence of pericardial  effusion. Mitral Valve: The mitral valve is normal in structure. No evidence of mitral valve regurgitation. No evidence of mitral valve stenosis. MV peak gradient, 2.5 mmHg. The mean mitral valve gradient is 1.0 mmHg. Tricuspid Valve: The tricuspid valve is normal in structure. Tricuspid valve regurgitation is mild . No evidence of tricuspid stenosis. Aortic Valve: The aortic valve was not well visualized. Aortic valve regurgitation is not visualized. No aortic stenosis is present. Aortic valve mean gradient measures 3.0 mmHg. Aortic valve peak gradient measures 6.2 mmHg. Aortic valve area, by VTI measures 2.96 cm. Pulmonic Valve: The pulmonic valve was not well visualized. Pulmonic valve regurgitation is not visualized. No evidence of pulmonic stenosis. Aorta: The aortic root is normal in size and structure. Venous: The inferior vena cava is normal in size with greater than 50% respiratory variability, suggesting right atrial pressure of 3 mmHg. IAS/Shunts: No atrial level shunt detected by color flow Doppler.  LEFT VENTRICLE PLAX 2D                        Biplane EF (MOD) LVIDd:         4.60 cm         LV Biplane EF:   Left LVIDs:         2.50 cm                          ventricular LV PW:         1.00 cm  ejection LV IVS:        0.70 cm                          fraction by LVOT diam:     1.99 cm                          2D MOD LV SV:         66                               biplane is LV SV Index:   38                               58.9 %. LVOT Area:     3.11 cm                                Diastology                                LV e' medial:    7.07 cm/s LV Volumes (MOD)               LV E/e' medial:  8.7 LV vol d, MOD    91.7 ml       LV e' lateral:   9.25 cm/s A2C:                           LV E/e' lateral: 6.7 LV vol d, MOD    88.5 ml A4C: LV vol s, MOD    35.6 ml A2C: LV vol s, MOD    38.2 ml A4C: LV SV MOD A2C:   56.1 ml LV SV MOD A4C:   88.5 ml LV SV MOD BP:    53.7 ml RIGHT  VENTRICLE             IVC RV Basal diam:  3.84 cm     IVC diam: 1.36 cm RV S prime:     11.60 cm/s TAPSE (M-mode): 2.1 cm      PULMONARY VEINS                             Diastolic Velocity: 62.80 cm/s                             S/D Velocity:       1.40                             Systolic Velocity:  89.80 cm/s LEFT ATRIUM             Index        RIGHT ATRIUM           Index LA diam:        3.04 cm 1.76 cm/m   RA Area:     11.70 cm LA Vol (A2C):   60.6 ml 35.12 ml/m  RA Volume:   26.40 ml  15.30 ml/m LA Vol (A4C):   69.9 ml  40.51 ml/m LA Biplane Vol: 65.3 ml 37.85 ml/m  AORTIC VALVE                    PULMONIC VALVE AV Area (Vmax):    2.33 cm     PV Vmax:       0.83 m/s AV Area (Vmean):   2.34 cm     PV Peak grad:  2.7 mmHg AV Area (VTI):     2.96 cm AV Vmax:           124.00 cm/s AV Vmean:          81.500 cm/s AV VTI:            0.223 m AV Peak Grad:      6.2 mmHg AV Mean Grad:      3.0 mmHg LVOT Vmax:         92.70 cm/s LVOT Vmean:        61.300 cm/s LVOT VTI:          0.212 m LVOT/AV VTI ratio: 0.95  AORTA Ao Root diam: 2.99 cm Ao Asc diam:  2.71 cm MITRAL VALVE               TRICUSPID VALVE MV Area (PHT): 5.93 cm    TR Peak grad:   53.0 mmHg MV Area VTI:   3.81 cm    TR Vmax:        364.00 cm/s MV Peak grad:  2.5 mmHg MV Mean grad:  1.0 mmHg    SHUNTS MV Vmax:       0.79 m/s    Systemic VTI:  0.21 m MV Vmean:      51.0 cm/s   Systemic Diam: 1.99 cm MV Decel Time: 128 msec MV E velocity: 61.80 cm/s MV A velocity: 75.00 cm/s MV E/A ratio:  0.82 Franck Azobou Tonleu Electronically signed by Joelle Cedars Tonleu Signature Date/Time: 01/11/2024/1:21:02 PM    Final    CT Angio Chest PE W and/or Wo Contrast Result Date: 01/10/2024 EXAM: CTA of the Chest with contrast for PE 01/10/2024 04:03:09 PM TECHNIQUE: CTA of the chest was performed after the administration of 75 mL of intravenous iohexol  (OMNIPAQUE ) 350 MG/ML injection 100 mL IOHEXOL  350 MG/ML SOLN. Multiplanar reformatted images are provided for  review. MIP images are provided for review. Automated exposure control, iterative reconstruction, and/or weight based adjustment of the mA/kV was utilized to reduce the radiation dose to as low as reasonably achievable. COMPARISON: None available. CLINICAL HISTORY: Pulmonary embolism (PE) suspected, high prob. FINDINGS: PULMONARY ARTERIES: Pulmonary arteries are adequately opacified for evaluation. No pulmonary embolism. Main pulmonary artery is normal in caliber. MEDIASTINUM: The heart and pericardium demonstrate no acute abnormality. There is no acute abnormality of the thoracic aorta. LYMPH NODES: No mediastinal, hilar or axillary lymphadenopathy. LUNGS AND PLEURA: Elevated left hemidiaphragm stable. Probable bilateral lung scarring is noted. Emphysematous disease is noted predominantly in the upper lobes. Minimal left basilar subsegmental atelectasis may be present. No focal consolidation or pulmonary edema. No pleural effusion or pneumothorax. UPPER ABDOMEN: Limited images of the upper abdomen are unremarkable. SOFT TISSUES AND BONES: No acute bone or soft tissue abnormality. IMPRESSION: 1. No pulmonary embolism. 2. Stable elevated left hemidiaphragm. 3. Probable bilateral lung scarring and emphysematous disease, predominantly in the upper lobes. 4. Given emphysema and typical screening age, consider evaluation for a low-dose CT lung cancer screening program. Electronically signed by: Lynwood Seip MD 01/10/2024 04:50 PM EST RP Workstation: [id number]  Subjective: - no chest pain, shortness of breath, no abdominal pain, nausea or vomiting.   Discharge Exam: BP (!) 145/84 (BP Location: Left Arm)   Pulse 86   Temp 97.8 F (36.6 C) (Oral)   Resp (!) 21   Ht 5' 5 (1.651 m)   Wt 62.6 kg   SpO2 99%   BMI 22.95 kg/m   General: Pt is alert, awake, not in acute distress Cardiovascular: RRR, S1/S2 +, no rubs, no gallops Respiratory: CTA bilaterally, no wheezing, no rhonchi Abdominal: Soft, NT, ND,  bowel sounds + Extremities: no edema, no cyanosis    The results of significant diagnostics from this hospitalization (including imaging, microbiology, ancillary and laboratory) are listed below for reference.     Microbiology: Recent Results (from the past 240 hours)  Respiratory (~20 pathogens) panel by PCR     Status: None   Collection Time: 01/10/24 10:48 PM   Specimen: Nasopharyngeal Swab; Respiratory  Result Value Ref Range Status   Adenovirus NOT DETECTED NOT DETECTED Final   Coronavirus 229E NOT DETECTED NOT DETECTED Final    Comment: (NOTE) The Coronavirus on the Respiratory Panel, DOES NOT test for the novel  Coronavirus (2019 nCoV)    Coronavirus HKU1 NOT DETECTED NOT DETECTED Final   Coronavirus NL63 NOT DETECTED NOT DETECTED Final   Coronavirus OC43 NOT DETECTED NOT DETECTED Final   Metapneumovirus NOT DETECTED NOT DETECTED Final   Rhinovirus / Enterovirus NOT DETECTED NOT DETECTED Final   Influenza A NOT DETECTED NOT DETECTED Final   Influenza B NOT DETECTED NOT DETECTED Final   Parainfluenza Virus 1 NOT DETECTED NOT DETECTED Final   Parainfluenza Virus 2 NOT DETECTED NOT DETECTED Final   Parainfluenza Virus 3 NOT DETECTED NOT DETECTED Final   Parainfluenza Virus 4 NOT DETECTED NOT DETECTED Final   Respiratory Syncytial Virus NOT DETECTED NOT DETECTED Final   Bordetella pertussis NOT DETECTED NOT DETECTED Final   Bordetella Parapertussis NOT DETECTED NOT DETECTED Final   Chlamydophila pneumoniae NOT DETECTED NOT DETECTED Final   Mycoplasma pneumoniae NOT DETECTED NOT DETECTED Final    Comment: Performed at [facility], [address] [place], [place] [address]  Resp panel by RT-PCR (RSV, Flu A&B, Covid) Anterior Nasal Swab     Status: None   Collection Time: 01/10/24 10:48 PM   Specimen: Anterior Nasal Swab  Result Value Ref Range Status   SARS Coronavirus 2 by RT PCR NEGATIVE NEGATIVE Final   Influenza A by PCR NEGATIVE NEGATIVE Final   Influenza B by  PCR NEGATIVE NEGATIVE Final    Comment: (NOTE) The Xpert Xpress SARS-CoV-2/FLU/RSV plus assay is intended as an aid in the diagnosis of influenza from Nasopharyngeal swab specimens and should not be used as a sole basis for treatment. Nasal washings and aspirates are unacceptable for Xpert Xpress SARS-CoV-2/FLU/RSV testing.  Fact Sheet for Patients: bloggercourse.com  Fact Sheet for Healthcare Providers: seriousbroker.it  This test is not yet approved or cleared by the [place]  FDA and has been authorized for detection and/or diagnosis of SARS-CoV-2 by FDA under an Emergency Use Authorization (EUA). This EUA will remain in effect (meaning this test can be used) for the duration of the COVID-19 declaration under Section 564(b)(1) of the Act, 21 U.S.C. section 360bbb-3(b)(1), unless the authorization is terminated or revoked.     Resp Syncytial Virus by PCR NEGATIVE NEGATIVE Final    Comment: (NOTE) Fact Sheet for Patients: bloggercourse.com  Fact Sheet for Healthcare Providers: seriousbroker.it  This test  is not yet approved or cleared by the [place]  FDA and has been authorized for detection and/or diagnosis of SARS-CoV-2 by FDA under an Emergency Use Authorization (EUA). This EUA will remain in effect (meaning this test can be used) for the duration of the COVID-19 declaration under Section 564(b)(1) of the Act, 21 U.S.C. section 360bbb-3(b)(1), unless the authorization is terminated or revoked.  Performed at [facility], [address], [place], [place] [address]      Labs: Basic Metabolic Panel: Recent Labs  Lab 01/10/24 1320 01/10/24 1331 01/10/24 2324 01/11/24 0405 01/12/24 0307  NA 139 138  --  136 139  K 4.1 3.9  --  4.8 4.5  CL 98  --   --  96* 98  CO2 33*  --   --  31 36*  GLUCOSE 90  --   --  228* 85  BUN 11  --   --  8 11  CREATININE  0.66  --   --  0.69 0.65  CALCIUM  9.8  --   --  8.3* 8.8*  MG 2.1  --   --  2.3 2.2  PHOS  --   --  3.1 2.9 3.5   Liver Function Tests: Recent Labs  Lab 01/10/24 1320 01/11/24 0405 01/12/24 0307  AST 21 22 15   ALT 13 15 14   ALKPHOS 97 77 63  BILITOT 0.3 0.6 0.2  PROT 7.7 6.7 6.2*  ALBUMIN 4.0 2.9* 2.8*   CBC: Recent Labs  Lab 01/10/24 1320 01/10/24 1331 01/11/24 0405 01/12/24 0307  WBC 11.6*  --  7.4 12.2*  NEUTROABS 7.9*  --   --   --   HGB 12.7* 15.3 11.9* 10.9*  HCT 41.8 45.0 40.1 37.3*  MCV 83.9  --  83.9 85.4  PLT 327  --  312 308   CBG: Recent Labs  Lab 01/13/24 0611 01/13/24 1133 01/13/24 1735 01/13/24 2143 01/14/24 0630  GLUCAP 196* 138* 153* 141* 115*   Hgb A1c No results for input(s): HGBA1C in the last 72 hours. Lipid Profile Recent Labs    01/12/24 0307  CHOL 113  HDL 47  LDLCALC 58  TRIG 40  CHOLHDL 2.4   Thyroid function studies No results for input(s): TSH, T4TOTAL, T3FREE, THYROIDAB in the last 72 hours.  Invalid input(s): FREET3 Urinalysis    Component Value Date/Time   COLORURINE YELLOW 01/11/2024 0547   APPEARANCEUR CLEAR 01/11/2024 0547   LABSPEC 1.030 01/11/2024 0547   PHURINE 5.0 01/11/2024 0547   GLUCOSEU >=500 (A) 01/11/2024 0547   HGBUR NEGATIVE 01/11/2024 0547   BILIRUBINUR NEGATIVE 01/11/2024 0547   KETONESUR NEGATIVE 01/11/2024 0547   PROTEINUR NEGATIVE 01/11/2024 0547   NITRITE NEGATIVE 01/11/2024 0547   LEUKOCYTESUR NEGATIVE 01/11/2024 0547    FURTHER DISCHARGE INSTRUCTIONS:   Get Medicines reviewed and adjusted: Please take all your medications with you for your next visit with your Primary MD   Laboratory/radiological data: Please request your Primary MD to go over all hospital tests and procedure/radiological results at the follow up, please ask your Primary MD to get all Hospital records sent to his/her office.   In some cases, they will be blood work, cultures and biopsy results pending at  the time of your discharge. Please request that your primary care M.D. goes through all the records of your hospital data and follows up on these results.   Also Note the following: If you experience worsening of your admission symptoms, develop shortness of  breath, life threatening emergency, suicidal or homicidal thoughts you must seek medical attention immediately by calling 911 or calling your MD immediately  if symptoms less severe.   You must read complete instructions/literature along with all the possible adverse reactions/side effects for all the Medicines you take and that have been prescribed to you. Take any new Medicines after you have completely understood and accpet all the possible adverse reactions/side effects.    Do not drive when taking Pain medications or sleeping medications (Benzodaizepines)   Do not take more than prescribed Pain, Sleep and Anxiety Medications. It is not advisable to combine anxiety,sleep and pain medications without talking with your primary care practitioner   Special Instructions: If you have smoked or chewed Tobacco  in the last 2 yrs please stop smoking, stop any regular Alcohol  and or any Recreational drug use.   Wear Seat belts while driving.   Please note: You were cared for by a hospitalist during your hospital stay. Once you are discharged, your primary care physician will handle any further medical issues. Please note that NO REFILLS for any discharge medications will be authorized once you are discharged, as it is imperative that you return to your primary care physician (or establish a relationship with a primary care physician if you do not have one) for your post hospital discharge needs so that they can reassess your need for medications and monitor your lab values.  Time coordinating discharge: 40 minutes  SIGNED:  Nilda Fendt, MD, PhD 01/14/2024, 9:59 AM

## 2024-01-14 NOTE — Plan of Care (Signed)

## 2024-01-14 NOTE — Progress Notes (Signed)
 Discharge   Patient expressed verbal understanding of discharge POC.   Patient given time to ask any questions.  Additional education included in AVS.  Alert oriented in good spirits.   Tele and PIV removed.  Pressure dressings intact.  CCMD/T[place]ka   Patient is on 2 liters O2 No resp difficulty.  Last elevated  BGL results were taken just after lunch.  Patient reports,  Im ok   Patient reminded to check BGL prior to next meal.  Discharging to Main A. Wife is bringing portable oxygen  at discharge.

## 2024-01-30 ENCOUNTER — Ambulatory Visit: Admitting: Internal Medicine

## 2024-01-30 ENCOUNTER — Encounter: Payer: Self-pay | Admitting: Internal Medicine

## 2024-01-30 VITALS — BP 128/82 | HR 80 | Ht 65.0 in | Wt 142.8 lb

## 2024-01-30 DIAGNOSIS — J449 Chronic obstructive pulmonary disease, unspecified: Secondary | ICD-10-CM | POA: Diagnosis not present

## 2024-01-30 DIAGNOSIS — J9611 Chronic respiratory failure with hypoxia: Secondary | ICD-10-CM | POA: Diagnosis not present

## 2024-01-30 DIAGNOSIS — Z87891 Personal history of nicotine dependence: Secondary | ICD-10-CM | POA: Diagnosis not present

## 2024-01-30 MED ORDER — PREDNISONE 10 MG PO TABS: ORAL_TABLET | ORAL | 11 refills | Status: DC

## 2024-01-30 NOTE — Progress Notes (Signed)
 Subjective:     Patient ID: Glenn [name], male   DOB: May 19, 2000     MRN: [id number]      Brief patient profile:  63  yobm quit smoking 09/2020   with onset of symptoms in 1995 while in [place] dx as asthma vs sarcoid and rx pred/ theoph / albuterol  seen by allergist dx as pollen moved to [place] where underwent bilateral lung surgery 2011 ? Lung vol reduction ? Helped breathing at the time but uses [facility] parking and  [facility] = can't walk a nl pace on a flat grade s sob but does fine slow and flat so referred to pulmonary clinic 10/05/2016 by Dr  Jerel      History of Present Illness  10/05/2016 1st [facility] office visit/ Embry Huss   Chief Complaint  Patient presents with   Pulmonary Consult    Referred by Dr. Aliene Lederer for eval of Asthma and COPD. Pt states he was dxed with COPD and Asthma in 1995. He moved here a year ago from [place], [place]. He states his breathing is okay today. He states he has a slight cold- prod cough with minimal brown sputum.    not much better since quit smoking = freq noct wheeze/ cough and need for saba and while on maint rx = arnuity  Quite a bit better with last pred/ very poor hfa baseline/ way overusing saba in multiple forms  Doe = MMRC2 = can't walk a nl pace on a flat grade s sob but does fine slow and flat   rec Prednisone  10 mg take  4 each am x 2 days,   2 each am x 2 days,  1 each am x 2 days and stop  Plan A = Automatic = dulera  200 Take 2 puffs first thing in am and then another 2 puffs about 12 hours later.  Work on [employer] B = Backup Only use your albuterol  (proair ) as a rescue medication Plan C = Crisis - only use your albuterol  nebulizer if you first try Plan B and it fails to help > ok to use the nebulizer up to every 4 hours but if start needing it regularly call for immediate appointment Please schedule a follow up office visit in 6 weeks, call sooner if needed with pfts    Admit 09/29/21 and 11/17/21  and tussionex made  the difference   01/18/2022  f/u ov/Marwan Lipe re: GOLD 2  maint on symbicort  160  / spiriva  one (rec was 2) puffs each am Chief Complaint  Patient presents with   Follow-up    Pt states he was in hospital for about 3 days since LOV. Pt was discharged with Oxygen  but has not used it.   Dyspnea:  not pushing buggy, riding scooter instead / no longer doing PT Cough: minimal rattling but can be severe and worse at bedtime / nothing purulent now  Sleeping: 30 degrees bed  SABA use: twice daily neb  02: not using  Rec Plan A = Automatic = Always=    symbicort  160 2 first thing in am / chase with spriiva 2.5 x 2 puff and 12 hours later symbicort  160 x 2 puffs Work on inhaler technique Plan B = Backup (to supplement plan A, not to replace it) Only use your albuterol  inhaler as a rescue medication Plan C = Crisis (instead of Plan B but only if Plan B stops working) - only use your albuterol  nebulizer if you first try Plan B  For cough > mucinex  1200 mg every 12 hours and take  oxycodone  up to every 4 hours  or hydrocodone  every 4 hours as needed           11/10/2022  f/u ov/[facility]/Draedyn Weidinger re: GOLD 2 copd  maint on symb/spiriva   Chief Complaint  Patient presents with   Follow-up    Doing well.  Does have some wheezing and cough in mornings.  Needs new nebulizer machine.   Dyspnea:  back limits activity  /  uses scooter most of the time / does walk at grocery  ok if very slowly  Cough:some wheezing/ congestion in am  Sleeping: 30 degress s    resp cc  SABA use: none  02: not using x after exertion. [facility]  Rec Make sure you check your oxygen  saturation  AT  your highest level of activity (not after you stop)   to be sure it stays over 90%   Plan A = Automatic = Always=    spiriva  / symbicort   2 puffs each am and repeat just the symbicort  in pm  Plan B = Backup (to supplement plan A, not to replace it) Only use your albuterol  inhaler as a rescue medication   Plan C = Crisis (instead of Plan B but only if Plan B stops working) - only use your albuterol  nebulizer if you first try Plan B We can provide you a new [facility]bulizer try [facility] the [facility]   CTa   08/26/23 There is some breathing motion which can limit evaluation of small and peripheral emboli. No segmental or larger pulmonary embolism identified at this time.   Extensive bronchial wall thickening with distortion and narrowing, similar to previous. Please correlate with known history. There is also underlying emphysematous lung changes.   Interstitial and some ground-glass changes identified along the lung bases bilaterally. Please correlate with history of treatment for pneumonia and recommend follow-up.   Aortic Atherosclerosis (ICD10-I70.0) and Emphysema (ICD10-J43.9)   11/09/2023  f/u ov/Fynn Vanblarcom re: GOLD 2 copd / RHD elevation since 2018    maint on ? did not bring all meds  Chief Complaint  Patient presents with   Medical Management of Chronic Issues    Follow up visit   Dyspnea:  riding a scooter at store now / hc parking  Cough: none  Sleeping: 30 degrees / one pillow s resp cc  SABA use: not clear but doe not use pre-exertion as rec  02: since last admit on 02 with exertion and sometimes at hs  Lung cancer screening :  see CTa above   Patient Instructions  Make sure you check your oxygen  saturation  AT  your highest level of activity (not after you stop)   to be sure it stays over 90% and adjust  02 flow upward to maintain this level if needed but remember to turn it back to previous settings when you stop (to conserve your supply).  Plan A = Automatic = Always=    spiriva  / symbicort   2 puffs each am and repeat just the symbicort  in pm x 2puffs  Work on inhaler technique: >> practice   with an empty inhaler  Plan B = Backup (to supplement plan A, not to replace it) Only use your albuterol  inhaler as a rescue medication  Plan C = Crisis (instead of Plan B but only if Plan B stops  working) - only use your albuterol  nebulizer if you first try Plan B  We will order you a new nebulizer  Prednisone  10 mg take  4 each am x 2 days,   2 each am x 2 days,  1 each am x 2 days and stop (refillable)  Please schedule a follow up visit in 3 months but call sooner if needed  with all medications /inhalers/ solutions in hand    Admit date: 01/10/2024 Discharge date: 01/14/2024   Brief Narrative / Interim history: 63 year old male with history of COPD, DM 2, HTN, hyperlipidemia comes into the hospital with shortness of breath, chest tightness.  He is on 2 L at home.  Patient basically tells me that he has had multiple hospitalizations this year, and he feels like he really does not have a good baseline anymore.  Symptoms have gotten much worse over the last couple of months after a trip in [place].  He states that he he gets very short of breath, wheezy, gets hospitalized, feels better but has found himself more more limited to how much he can walk.  He reports also worsening substernal chest pain, pressure-like, every time he tries to get up and move around.  He does not know any cardiac history that he is aware of, but his chest pain is fairly predictable, happens with ambulation, he becomes short winded and upon stopping his chest pain goes away.   Hospital Course / Discharge diagnoses: Principal problem Acute on chronic hypoxic respiratory failure, underlying COPD with exacerbation -patient was admitted to the hospital with COPD exacerbation.  He was placed on steroids, nebulizers, azithromycin  with improvement in his respiratory symptoms, he is now feeling back to baseline.  Pulmonary was consulted and evaluated patient as well.  Patient has been experiencing significant worsening of his cough mostly in the past month, which has been extremely difficult to manage and contributing to significant worsening of his dyspnea on exertion.  He feels like promethazine  has been working really well  for him here and in the past.  With improvement will be discharged home in stable condition, was advised to follow-up with Dr. Darlean with pulmonology as well as [facility].  He is to finish a prednisone  taper and couple more days of azithromycin    Active problems Chest pain -pretty much with every activity, raising concern for a component of angina.  He does have poor respiratory status at baseline but chest pain has some typical characteristics such as getting worse with activity and resolving with Sublingual nitroglycerin  and rest. Cardiology consulted and evaluated patient while hospitalized, they will start ischemic workup as an outpatient, he has an appointment on January 8 History of CVA-continue aspirin , he apparently is not on Plavix  anymore but defer to outpatient MDs Chronic pain-continue Spicher and short acting home pain regimen  History of sarcoidosis - Currently on steroids. Outpatient follow up    01/30/2024  f/u ov/Kealan Buchan re: GOLD 2 copd / RHD elevation since 2018   maint on symb/spriva   did  bring meds Chief Complaint  Patient presents with   Hospitalization Follow-up    Breathing has improved but not quite back to his baseline. He has some dry cough.     Dyspnea:  very sedentary and really  Not limited by breathing from desired activities   Cough: nightly p hs x  2-3 month/ non productive - much better while on prednisone   Sleeping: 40 degrees helps cough   SABA use: rarely hfa/ neb  02: 2lpm hs  Continues with same chronic cp as above  s specific features     No obvious day to day or daytime variability or assoc excess/ purulent sputum or mucus plugs or hemoptysis or  chest tightness, subjective wheeze or overt sinus or hb symptoms.    Also denies any obvious fluctuation of symptoms with weather or environmental changes or other aggravating or alleviating factors except as outlined above   No unusual exposure hx or h/o childhood pna/ asthma or knowledge of premature  birth.  Current Allergies, Complete Past Medical History, Past Surgical History, Family History, and Social History were reviewed in [employer] record.  ROS  The following are not active complaints unless bolded Hoarseness, sore throat, dysphagia, dental problems, itching, sneezing,  nasal congestion or discharge of excess mucus or purulent secretions, ear ache,   fever, chills, sweats, unintended wt loss or wt gain, classically pleuritic   cp,  orthopnea pnd or arm/hand swelling  or leg swelling, presyncope, palpitations, abdominal pain, anorexia, nausea, vomiting, diarrhea  or change in bowel habits or change in bladder habits, change in stools or change in urine, dysuria, hematuria,  rash, arthralgias, visual complaints, headache, numbness, weakness or ataxia or problems with walking or coordination,  change in mood or  memory.          Outpatient Medications Prior to Visit  Medication Sig Dispense Refill   albuterol  (VENTOLIN  HFA) 108 (90 Base) MCG/ACT inhaler Inhale 1-2 puffs into the lungs every 6 (six) hours as needed for wheezing or shortness of breath. (Patient taking differently: Inhale 2 puffs into the lungs every 6 (six) hours as needed for shortness of breath.) 18 g 0   amLODipine  (NORVASC ) 10 MG tablet Take 1 tablet (10 mg total) by mouth daily. 30 tablet 3   aspirin  EC 81 MG tablet Take 81 mg by mouth daily. Swallow whole.     budesonide -formoterol  (SYMBICORT ) 160-4.5 MCG/ACT inhaler Inhale 2 puffs into the lungs 2 (two) times daily. 10.2 g 1   Cholecalciferol (VITAMIN D3) 50 MCG (2000 UT) CHEW Chew 4,000 Units by mouth daily.     clopidogrel  (PLAVIX ) 75 MG tablet Take 1 tablet (75 mg total) by mouth daily. 30 tablet 1   docusate sodium  (COLACE) 100 MG capsule Take 100 mg by mouth 2 (two) times daily as needed for mild constipation.     EPINEPHrine  0.3 mg/0.3 mL IJ SOAJ injection Inject 0.3 mg into the muscle as needed for anaphylaxis.     famotidine   (PEPCID ) 20 MG tablet One after supper (Patient taking differently: Take 20 mg by mouth daily as needed for heartburn or indigestion.) 30 tablet 11   ferrous sulfate  325 (65 FE) MG tablet Take 325 mg by mouth every Monday, Wednesday, and Friday.     fluticasone  (FLONASE ) 50 MCG/ACT nasal spray Place 2 sprays into both nostrils daily for 10 days. (Patient taking differently: Place 2 sprays into both nostrils daily as needed for allergies or rhinitis (or seasonal allergies).) 16 g 2   guaiFENesin  (MUCINEX ) 600 MG 12 hr tablet Take 600 mg by mouth in the morning.     hydrOXYzine  (ATARAX ) 25 MG tablet Take 25 mg by mouth at bedtime.     ipratropium-albuterol  (DUONEB) 0.5-2.5 (3) MG/3ML SOLN Take 3 mLs by nebulization 3 (three) times daily for 5 days, THEN 3 mLs every 6 (six) hours as needed for up to 5 days. (Patient taking differently: Nebulize 3 ml's and inhale into the lungs 4-5 times a day) 360 mL 1   loratadine  (CLARITIN ) 10  MG tablet Take 1 tablet (10 mg total) by mouth daily. 30 tablet 5   metFORMIN  (GLUCOPHAGE ) 500 MG tablet Take 1 tablet (500 mg total) by mouth 2 (two) times daily with a meal. (Patient taking differently: Take 500 mg by mouth 2 (two) times daily before a meal.) 60 tablet 3   montelukast  (SINGULAIR ) 10 MG tablet Take 10 mg by mouth at bedtime.     naloxone  (NARCAN ) nasal spray 4 mg/0.1 mL Place 1 spray into the nose as needed (opioid overdose - respiratory distress).     oxyCODONE  (OXYCONTIN ) 10 mg 12 hr tablet Take 10 mg by mouth every 12 (twelve) hours.     Oxycodone  HCl 20 MG TABS Take 20 mg by mouth every 6 (six) hours as needed (pain).     OXYGEN  Inhale 2-3 L/min into the lungs as needed (for shortness of breath when exerted).     pantoprazole  (PROTONIX ) 40 MG tablet Take 1 tablet (40 mg total) by mouth daily. 30 tablet 0   polyethylene glycol (MIRALAX  / GLYCOLAX ) 17 g packet Take 17 g by mouth daily. (Patient taking differently: Take 17 g by mouth daily as needed for moderate  constipation.)     pravastatin  (PRAVACHOL ) 10 MG tablet Take 10 mg by mouth at bedtime.     prazosin  (MINIPRESS ) 1 MG capsule Take 1 mg by mouth at bedtime.     promethazine -dextromethorphan  (PROMETHAZINE -DM) 6.25-15 MG/5ML syrup Take 5 mLs by mouth 4 (four) times daily as needed for cough. 240 mL 2   senna-docusate (SENOKOT-S) 8.6-50 MG tablet Take 1 tablet by mouth 2 (two) times daily. (Patient taking differently: Take 1 tablet by mouth 2 (two) times daily as needed for mild constipation or moderate constipation.)     sildenafil  (VIAGRA ) 100 MG tablet Take 1 tablet (100 mg total) by mouth daily as needed. (Patient taking differently: Take 100 mg by mouth daily as needed for erectile dysfunction.) 30 tablet 5   tamsulosin  (FLOMAX ) 0.4 MG CAPS capsule Take 1 capsule (0.4 mg total) by mouth daily. 30 capsule 1   Tiotropium Bromide  Monohydrate 2.5 MCG/ACT AERS Inhale 2 puffs into the lungs daily.     vitamin C (ASCORBIC ACID ) 250 MG tablet Take 250 mg by mouth daily.     No facility-administered medications prior to visit.      Past Medical History:  Diagnosis Date   Anxiety    Arthritis    Asthma    Chronic pain 05/06/2020   COPD (chronic obstructive pulmonary disease) (HCC)    CVA (cerebral vascular accident) (HCC)    2010; 06/2020   Depression    Diabetes mellitus type 2 in nonobese [facility])    Dyspnea    ED (erectile dysfunction)    Hyperlipidemia    Hypertension    Sarcoid    Tobacco dependence 05/06/2020          Objective:   Physical Exam   Wts  01/30/2024       142  11/09/2023        143  11/10/2022        143  03/01/2022        151  01/18/2022      148 11/22/2021      146   08/16/2021        149  11/11/2020        147  08/11/2020          144 01/15/2020        140 03/26/2019  144  11/20/2018      144  08/14/2018          144  02/08/2018          143  11/22/17 142 lb (64.4 kg)  10/05/16 131 lb 12.8 oz (59.8 kg)   Vital signs reviewed  01/30/2024  - Note at rest 02  sats  82% on RA   General appearance:    amb alert somber bm nad, somewhat of a flat affect  HEENT :  Oropharynx  clear   Nasal turbinates nl    NECK :  without JVD/Nodes/TM/ nl carotid upstrokes bilaterally   LUNGS: no acc muscle use,  Mod barrel  contour chest wall with bilateral  Distant exp  wheeze and  without cough on insp or exp maneuvers and mod  Hyperresonant  to  percussion bilaterally     CV:  RRR  no s3 or murmur or increase in P2, and no edema   ABD:  soft and nontender with pos mid insp Hoover's  in the supine position. No bruits or organomegaly appreciated, bowel sounds nl  MS:   Ext warm without deformities or   obvious joint restrictions , calf tenderness, cyanosis or clubbing  SKIN: warm and dry without lesions    NEURO:  alert, approp, nl sensorium with  no motor or cerebellar deficits apparent.             Assessment:    Assessment & Plan COPD GOLD  II Quit smoking 05/2020    - Spirometry 10/05/2016  FEV1 1.36 (52%)  Ratio 65 p saba with classic curvature - 10/05/2016  > try dulera  200 2bid   - Allergy  profile 11/22/2017 >  Eos 0.6 /  IgE  25 RAST neg x for roaches/ neg for mold  - PFT's  02/08/2018  FEV1 0.90 (36 % ) ratio 59   p multiple saba prior to study with DLCO  52 % corrects to 106  % for alv volume   - 01/15/2020 re-started on symb 160/spiriva  2.5 daily  - PFT's  08/11/2020  FEV1 1.34 (54 % ) ratio 0.61  p 10 % improvement from saba p spiriva  prior to study with DLCO  11.94 (52%) corrects to 3.41 (77%)  for alv vol  -  01/30/2024  After extensive coaching inhaler device,  effectiveness =   75% (short Ti)   >>> continue symb/ spiriva  and added pred x 6 d as plan D  >>>  add Pepcid   20 mg HS for possible noct gerd   Chronic respiratory failure with hypoxia (HCC) 01/30/2024 Sats 82% at rest RA and 94% on 2lpm cont   Rec as of 01/30/2024  2lpm 24/7 though ok to titrate up with exertion to reach sats of > 90%   Many of his symptoms may be related to  chronic hypoxemia so will start by correcting this problem 24/7 if he'll comply with recs    Each RESP maintenance medication was reviewed in detail including emphasizing most importantly the difference between maintenance and prns and under what circumstances the prns are to be triggered using an action plan format where appropriate.  Total time for H and P, chart review, counseling, reviewing hfa/ smi/ neb / 02 / pulse ox  device(s) and generating customized AVS unique to this office visit / same day charting = 35 min                  AVS  Patient Instructions  We will order you a new nebulizer machine today  Symbicort  160 should be Take 2 puffs first thing in am and then another 2 puffs about 12 hours later.   Pepcid   20 mg bedtime (over the counter)   Also  Ok to try albuterol  15 min before an activity (on alternating days)  that you know would usually make you short of breath and see if it makes any difference and if makes none then don't take albuterol  after activity unless you can't catch your breath as this means it's the resting that helps, not the albuterol .     Make sure you check your oxygen  saturation  AT  your highest level of activity (not after you stop)   to be sure it stays over 90% and adjust  02 flow upward to maintain this level if needed but remember to turn it back to previous settings when you stop (to conserve your supply).   Plan A = Automatic = Always=    Symbicort  and spiriva     Plan B = Backup (to supplement plan A, not to replace it) Use your albuterol  inhaler as a rescue medication to be used if you can't catch your breath by resting or slowing your pace  or doing a relaxed purse lip breathing pattern.  - The less you use it, the better it will work when you need it. - Ok to use the inhaler up to 2 puffs  every 4 hours if you must but call for appointment if use goes up over your usual need - Don't leave home without it !!  (think of it like the spare tire  or starter fluid for your car)   Plan C = Crisis (instead of Plan B but only if Plan B stops working) - only use your albuterol  nebulizer if you first try Plan B and it fails to help > ok to use the nebulizer up to every 4 hours but if start needing it regularly call for immediate appointment   Plan D = Deltasone  = prednisone   x 6 days if ABC not working     Please schedule a follow up visit in 6 months but call sooner if needed            Ozell America, MD 01/31/2024

## 2024-01-30 NOTE — Assessment & Plan Note (Addendum)
 Quit smoking 05/2020    - Spirometry 10/05/2016  FEV1 1.36 (52%)  Ratio 65 p saba with classic curvature - 10/05/2016  > try dulera  200 2bid   - Allergy  profile 11/22/2017 >  Eos 0.6 /  IgE  25 RAST neg x for roaches/ neg for mold  - PFT's  02/08/2018  FEV1 0.90 (36 % ) ratio 59   p multiple saba prior to study with DLCO  52 % corrects to 106  % for alv volume   - 01/15/2020 re-started on symb 160/spiriva  2.5 daily  - PFT's  08/11/2020  FEV1 1.34 (54 % ) ratio 0.61  p 10 % improvement from saba p spiriva  prior to study with DLCO  11.94 (52%) corrects to 3.41 (77%)  for alv vol  -  01/30/2024  After extensive coaching inhaler device,  effectiveness =   75% (short Ti)   >>> continue symb/ spiriva  and added pred x 6 d as plan D  >>>  add Pepcid   20 mg HS for possible noct gerd

## 2024-01-30 NOTE — Patient Instructions (Addendum)
 We will order you a new nebulizer machine today  Symbicort  160 should be Take 2 puffs first thing in am and then another 2 puffs about 12 hours later.   Pepcid   20 mg bedtime (over the counter)   Also  Ok to try albuterol  15 min before an activity (on alternating days)  that you know would usually make you short of breath and see if it makes any difference and if makes none then don't take albuterol  after activity unless you can't catch your breath as this means it's the resting that helps, not the albuterol .     Make sure you check your oxygen  saturation  AT  your highest level of activity (not after you stop)   to be sure it stays over 90% and adjust  02 flow upward to maintain this level if needed but remember to turn it back to previous settings when you stop (to conserve your supply).   Plan A = Automatic = Always=    Symbicort  and spiriva     Plan B = Backup (to supplement plan A, not to replace it) Use your albuterol  inhaler as a rescue medication to be used if you can't catch your breath by resting or slowing your pace  or doing a relaxed purse lip breathing pattern.  - The less you use it, the better it will work when you need it. - Ok to use the inhaler up to 2 puffs  every 4 hours if you must but call for appointment if use goes up over your usual need - Don't leave home without it !!  (think of it like the spare tire or starter fluid for your car)   Plan C = Crisis (instead of Plan B but only if Plan B stops working) - only use your albuterol  nebulizer if you first try Plan B and it fails to help > ok to use the nebulizer up to every 4 hours but if start needing it regularly call for immediate appointment   Plan D = Deltasone  = prednisone   x 6 days if ABC not working     Please schedule a follow up visit in 6 months but call sooner if needed

## 2024-01-30 NOTE — Assessment & Plan Note (Addendum)
 01/30/2024 Sats 82% at rest RA and 94% on 2lpm cont   Rec as of 01/30/2024  2lpm 24/7 though ok to titrate up with exertion to reach sats of > 90%   Many of his symptoms may be related to chronic hypoxemia so will start by correcting this problem 24/7 if he'll comply with recs    Each RESP maintenance medication was reviewed in detail including emphasizing most importantly the difference between maintenance and prns and under what circumstances the prns are to be triggered using an action plan format where appropriate.  Total time for H and P, chart review, counseling, reviewing hfa/ smi/ neb / 02 / pulse ox  device(s) and generating customized AVS unique to this office visit / same day charting = 35 min

## 2024-02-06 ENCOUNTER — Other Ambulatory Visit (HOSPITAL_COMMUNITY): Payer: Self-pay

## 2024-02-09 ENCOUNTER — Encounter (HOSPITAL_COMMUNITY): Payer: Self-pay

## 2024-02-09 ENCOUNTER — Telehealth (HOSPITAL_COMMUNITY): Payer: Self-pay | Admitting: *Deleted

## 2024-02-09 MED ORDER — METOPROLOL TARTRATE 100 MG PO TABS: ORAL_TABLET | ORAL | 0 refills | Status: AC

## 2024-02-09 NOTE — Telephone Encounter (Signed)
 Reaching out to patient to offer assistance regarding upcoming cardiac imaging study; pt verbalizes understanding of appt date/time, medications ordered, and verified current allergies; name and call back number provided for further questions should they arise  Chantal Requena RN Navigator Cardiac Imaging [facility] [phone] office [phone] cell  Patient to take 100mg  metoprolol  tartrate two hours prior to his cardiac CT scan.  We did not review in instructions in detail but I informed the patient that I will send him mychart instructions for his review. He verbalized understanding.

## 2024-02-12 ENCOUNTER — Ambulatory Visit (HOSPITAL_COMMUNITY)
Admission: RE | Admit: 2024-02-12 | Discharge: 2024-02-12 | Disposition: A | Discharge status: Home / Self Care | Source: Ambulatory Visit | Attending: Student | Admitting: Student

## 2024-02-12 DIAGNOSIS — I259 Chronic ischemic heart disease, unspecified: Secondary | ICD-10-CM | POA: Insufficient documentation

## 2024-02-12 MED ORDER — NITROGLYCERIN 0.4 MG SL SUBL
0.8000 mg | SUBLINGUAL_TABLET | Freq: Once | SUBLINGUAL | Status: AC
  Administered 2024-02-12: 0.8 mg via SUBLINGUAL

## 2024-02-12 MED ORDER — IOHEXOL 350 MG/ML SOLN
100.0000 mL | Freq: Once | INTRAVENOUS | Status: AC | PRN
  Administered 2024-02-12: 100 mL via INTRAVENOUS

## 2024-02-12 MED ORDER — DILTIAZEM HCL 25 MG/5ML IV SOLN
10.0000 mg | INTRAVENOUS | Status: DC | PRN
  Administered 2024-02-12: 10 mg via INTRAVENOUS

## 2024-02-13 ENCOUNTER — Telehealth (HOSPITAL_COMMUNITY): Payer: Self-pay

## 2024-02-13 NOTE — Telephone Encounter (Signed)
 Outside/paper referral [facility] by fax from [facility].. Called patient to see if he was interested in Cardiac rehab.  Patient stated that he was interested. Advised that referral with be forwarded to nurse navigator and we will be calling him to get him  scheduled.

## 2024-02-14 ENCOUNTER — Ambulatory Visit: Payer: Self-pay | Admitting: Student

## 2024-02-15 ENCOUNTER — Ambulatory Visit: Admitting: General Practice

## 2024-02-19 ENCOUNTER — Telehealth (HOSPITAL_COMMUNITY): Payer: Self-pay | Admitting: *Deleted

## 2024-02-19 NOTE — Telephone Encounter (Signed)
 Received [facility] authorization [id number] for this pt to participate in Cardiac rehab.  Reviewed documents and it is unclear which program is indicated.  Some places show Cardiac rehab where other places show pulmonary rehab.  Diagnosis is COPD. Notes wears oxygen  and several hospitalizations for Pneumonia.  Called and spoke to [place], care in the community [place] for clarification.  Spoke with nurse and the referral should be for pulmonary rehab.  Will resend authorization for pulmonary rehab.  Requested follow up clinic notes to accompany the new referral. Saturnino Koyanagi RN, BSN Cardiac and Pulmonary Rehab Nurse Navigator

## 2024-02-23 ENCOUNTER — Encounter (HOSPITAL_COMMUNITY): Payer: Self-pay

## 2024-02-26 ENCOUNTER — Telehealth: Payer: Self-pay | Admitting: Internal Medicine

## 2024-02-26 NOTE — Telephone Encounter (Signed)
 Awaiting signature from dr wert then to fax back to [facility].

## 2024-02-26 NOTE — Telephone Encounter (Signed)
 PT is dropping off Application form renewal of disability parking placard. Notified pt that Dr.Wert is not in office but I will fax it over to the [place] office for his completion. Please notify pt when from has been completed and faxed back to the [place]  office for him to pick up.

## 2024-02-27 ENCOUNTER — Other Ambulatory Visit: Payer: Self-pay

## 2024-02-27 ENCOUNTER — Emergency Department (HOSPITAL_BASED_OUTPATIENT_CLINIC_OR_DEPARTMENT_OTHER): Admitting: Radiology

## 2024-02-27 ENCOUNTER — Emergency Department (HOSPITAL_BASED_OUTPATIENT_CLINIC_OR_DEPARTMENT_OTHER)
Admission: EM | Admit: 2024-02-27 | Discharge: 2024-02-27 | Disposition: A | Discharge status: Home / Self Care | Attending: Emergency Medicine | Admitting: Emergency Medicine

## 2024-02-27 ENCOUNTER — Other Ambulatory Visit (HOSPITAL_BASED_OUTPATIENT_CLINIC_OR_DEPARTMENT_OTHER): Payer: Self-pay

## 2024-02-27 ENCOUNTER — Encounter (HOSPITAL_BASED_OUTPATIENT_CLINIC_OR_DEPARTMENT_OTHER): Payer: Self-pay

## 2024-02-27 DIAGNOSIS — Z7902 Long term (current) use of antithrombotics/antiplatelets: Secondary | ICD-10-CM | POA: Insufficient documentation

## 2024-02-27 DIAGNOSIS — Z7951 Long term (current) use of inhaled steroids: Secondary | ICD-10-CM | POA: Insufficient documentation

## 2024-02-27 DIAGNOSIS — E119 Type 2 diabetes mellitus without complications: Secondary | ICD-10-CM | POA: Insufficient documentation

## 2024-02-27 DIAGNOSIS — Z79899 Other long term (current) drug therapy: Secondary | ICD-10-CM | POA: Insufficient documentation

## 2024-02-27 DIAGNOSIS — I2510 Atherosclerotic heart disease of native coronary artery without angina pectoris: Secondary | ICD-10-CM | POA: Insufficient documentation

## 2024-02-27 DIAGNOSIS — I10 Essential (primary) hypertension: Secondary | ICD-10-CM | POA: Insufficient documentation

## 2024-02-27 DIAGNOSIS — Z7984 Long term (current) use of oral hypoglycemic drugs: Secondary | ICD-10-CM | POA: Insufficient documentation

## 2024-02-27 DIAGNOSIS — J441 Chronic obstructive pulmonary disease with (acute) exacerbation: Secondary | ICD-10-CM | POA: Insufficient documentation

## 2024-02-27 DIAGNOSIS — Z7982 Long term (current) use of aspirin: Secondary | ICD-10-CM | POA: Insufficient documentation

## 2024-02-27 LAB — RESP PANEL BY RT-PCR (RSV, FLU A&B, COVID)  RVPGX2
Influenza A by PCR: NEGATIVE
Influenza B by PCR: NEGATIVE
Resp Syncytial Virus by PCR: NEGATIVE
SARS Coronavirus 2 by RT PCR: NEGATIVE

## 2024-02-27 LAB — CBC WITH DIFFERENTIAL/PLATELET
Abs Immature Granulocytes: 0.04 K/uL (ref 0.00–0.07)
Basophils Absolute: 0 K/uL (ref 0.0–0.1)
Basophils Relative: 0 %
Eosinophils Absolute: 0.3 K/uL (ref 0.0–0.5)
Eosinophils Relative: 2 %
HCT: 38.5 % — ABNORMAL LOW (ref 39.0–52.0)
Hemoglobin: 11.5 g/dL — ABNORMAL LOW (ref 13.0–17.0)
Immature Granulocytes: 0 %
Lymphocytes Relative: 35 %
Lymphs Abs: 3.6 K/uL (ref 0.7–4.0)
MCH: 25.8 pg — ABNORMAL LOW (ref 26.0–34.0)
MCHC: 29.9 g/dL — ABNORMAL LOW (ref 30.0–36.0)
MCV: 86.3 fL (ref 80.0–100.0)
Monocytes Absolute: 0.9 K/uL (ref 0.1–1.0)
Monocytes Relative: 9 %
Neutro Abs: 5.5 K/uL (ref 1.7–7.7)
Neutrophils Relative %: 54 %
Platelets: 279 K/uL (ref 150–400)
RBC: 4.46 MIL/uL (ref 4.22–5.81)
RDW: 17.2 % — ABNORMAL HIGH (ref 11.5–15.5)
WBC: 10.4 K/uL (ref 4.0–10.5)
nRBC: 0 % (ref 0.0–0.2)

## 2024-02-27 LAB — BASIC METABOLIC PANEL WITH GFR
Anion gap: 8 (ref 5–15)
BUN: 15 mg/dL (ref 8–23)
CO2: 35 mmol/L — ABNORMAL HIGH (ref 22–32)
Calcium: 9.8 mg/dL (ref 8.9–10.3)
Chloride: 101 mmol/L (ref 98–111)
Creatinine, Ser: 0.84 mg/dL (ref 0.61–1.24)
GFR, Estimated: >60 mL/min
Glucose, Bld: 187 mg/dL — ABNORMAL HIGH (ref 70–99)
Potassium: 3.7 mmol/L (ref 3.5–5.1)
Sodium: 143 mmol/L (ref 135–145)

## 2024-02-27 LAB — LACTIC ACID, PLASMA: Lactic Acid, Venous: 1.1 mmol/L (ref 0.5–1.9)

## 2024-02-27 LAB — TROPONIN T, HIGH SENSITIVITY: Troponin T High Sensitivity: 15 ng/L (ref 0–19)

## 2024-02-27 MED ORDER — AZITHROMYCIN 250 MG PO TABS
250.0000 mg | ORAL_TABLET | Freq: Every day | ORAL | 0 refills | Status: AC
  Filled 2024-02-27: qty 6, 5d supply, fill #0

## 2024-02-27 MED ORDER — ALBUTEROL SULFATE (2.5 MG/3ML) 0.083% IN NEBU
INHALATION_SOLUTION | RESPIRATORY_TRACT | Status: AC
  Administered 2024-02-27: 5 mg
  Filled 2024-02-27: qty 6

## 2024-02-27 MED ORDER — IPRATROPIUM-ALBUTEROL 0.5-2.5 (3) MG/3ML IN SOLN
RESPIRATORY_TRACT | Status: AC
  Administered 2024-02-27: 3 mL
  Filled 2024-02-27: qty 3

## 2024-02-27 MED ORDER — MAGNESIUM SULFATE 2 GM/50ML IV SOLN
2.0000 g | Freq: Once | INTRAVENOUS | Status: AC
  Administered 2024-02-27: 2 g via INTRAVENOUS
  Filled 2024-02-27: qty 50

## 2024-02-27 MED ORDER — PREDNISONE 20 MG PO TABS
40.0000 mg | ORAL_TABLET | Freq: Every day | ORAL | 0 refills | Status: AC
  Filled 2024-02-27: qty 10, 5d supply, fill #0

## 2024-02-27 MED ORDER — METHYLPREDNISOLONE SODIUM SUCC 125 MG IJ SOLR
125.0000 mg | Freq: Once | INTRAMUSCULAR | Status: AC
  Administered 2024-02-27: 125 mg via INTRAVENOUS
  Filled 2024-02-27: qty 2

## 2024-02-27 MED ORDER — PROMETHAZINE-DM 6.25-15 MG/5ML PO SYRP
5.0000 mL | ORAL_SOLUTION | Freq: Four times a day (QID) | ORAL | 2 refills | Status: AC | PRN
  Filled 2024-02-27: qty 240, 12d supply, fill #0

## 2024-02-27 NOTE — ED Triage Notes (Signed)
 Patient history of asthma. States short of breath x 1 week.  Present on O2.

## 2024-02-27 NOTE — ED Provider Notes (Signed)
 " [place] [facility] AT [facility] Provider Note   CSN: [id number] Arrival date & time: 02/27/24  9067     Patient presents with: Shortness of Breath   Glenn [name] is a 64 y.o. male with past medical history of severe COPD, asthma, DM2, CVA, CAD, HTN, HLD presenting to ED with complaint of SOB, cough and chills at home. Reports that this started 5 days ago, got worse today. Reports he has been using inhalers at home, not seeming to help today with his cough. Reports chronic chest tightness, not worse than normal. No swelling in feet and ankles. Has sick contact at home, unsure what sick contact has but does present with similar symptoms.   He is on 2L [place] at baseline and has been doing well with this at home, reported to ER without O2 on and found 85% RA, sating 98% on baseline 2L now and feeling better.     Shortness of Breath      Prior to Admission medications  Medication Sig Start Date End Date Taking? Authorizing Provider  azithromycin  (ZITHROMAX ) 250 MG tablet Take first 2 tablets together, then 1 every day until finished. 02/27/24  Yes Suzannah Bettes, Warren SAILOR, PA-C  predniSONE  (DELTASONE ) 20 MG tablet Take 2 tablets (40 mg total) by mouth daily. 02/27/24  Yes Cassidy Tabet N, PA-C  albuterol  (VENTOLIN  HFA) 108 (90 Base) MCG/ACT inhaler Inhale 1-2 puffs into the lungs every 6 (six) hours as needed for wheezing or shortness of breath. Patient taking differently: Inhale 2 puffs into the lungs every 6 (six) hours as needed for shortness of breath. 08/29/23   Sebastian Toribio GAILS, MD  amLODipine  (NORVASC ) 10 MG tablet Take 1 tablet (10 mg total) by mouth daily. 10/03/21   Rai, Nydia POUR, MD  aspirin  EC 81 MG tablet Take 81 mg by mouth daily. Swallow whole.    [provider]  budesonide -formoterol  (SYMBICORT ) 160-4.5 MCG/ACT inhaler Inhale 2 puffs into the lungs 2 (two) times daily. 08/29/23   Sebastian Toribio GAILS, MD  Cholecalciferol (VITAMIN D3) 50 MCG (2000 UT)  CHEW Chew 4,000 Units by mouth daily.    [provider]  clopidogrel  (PLAVIX ) 75 MG tablet Take 1 tablet (75 mg total) by mouth daily. 08/30/23   Sebastian Toribio GAILS, MD  docusate sodium  (COLACE) 100 MG capsule Take 100 mg by mouth 2 (two) times daily as needed for mild constipation.    [provider]  EPINEPHrine  0.3 mg/0.3 mL IJ SOAJ injection Inject 0.3 mg into the muscle as needed for anaphylaxis. 08/08/23   [provider]  famotidine  (PEPCID ) 20 MG tablet One after supper Patient taking differently: Take 20 mg by mouth daily as needed for heartburn or indigestion. 11/22/21   Darlean Ozell NOVAK, MD  ferrous sulfate  325 (65 FE) MG tablet Take 325 mg by mouth every Monday, Wednesday, and Friday.    [provider]  fluticasone  (FLONASE ) 50 MCG/ACT nasal spray Place 2 sprays into both nostrils daily for 10 days. Patient taking differently: Place 2 sprays into both nostrils daily as needed for allergies or rhinitis (or seasonal allergies). 08/30/23   Sebastian Toribio GAILS, MD  guaiFENesin  (MUCINEX ) 600 MG 12 hr tablet Take 600 mg by mouth in the morning.    [provider]  hydrOXYzine  (ATARAX ) 25 MG tablet Take 25 mg by mouth at bedtime. 08/08/23   [provider]  ipratropium-albuterol  (DUONEB) 0.5-2.5 (3) MG/3ML SOLN Take 3 mLs by nebulization 3 (three) times daily for  5 days, THEN 3 mLs every 6 (six) hours as needed for up to 5 days. Patient taking differently: Nebulize 3 ml's and inhale into the lungs 4-5 times a day 08/29/23 01/30/24  Sebastian Toribio GAILS, MD  loratadine  (CLARITIN ) 10 MG tablet Take 1 tablet (10 mg total) by mouth daily. 01/03/22     metFORMIN  (GLUCOPHAGE ) 500 MG tablet Take 1 tablet (500 mg total) by mouth 2 (two) times daily with a meal. Patient taking differently: Take 500 mg by mouth 2 (two) times daily before a meal. 10/03/21   Rai, Ripudeep K, MD  metoprolol  tartrate (LOPRESSOR ) 100 MG tablet Take tablet (100mg ) TWO hours prior to  your cardiac CT scan. 02/09/24   Adams, Zane, PA-C  montelukast  (SINGULAIR ) 10 MG tablet Take 10 mg by mouth at bedtime.    [provider]  naloxone  (NARCAN ) nasal spray 4 mg/0.1 mL Place 1 spray into the nose as needed (opioid overdose - respiratory distress). 08/20/21   [provider]  oxyCODONE  (OXYCONTIN ) 10 mg 12 hr tablet Take 10 mg by mouth every 12 (twelve) hours.    [provider]  Oxycodone  HCl 20 MG TABS Take 20 mg by mouth every 6 (six) hours as needed (pain).    [provider]  OXYGEN  Inhale 2-3 L/min into the lungs as needed (for shortness of breath when exerted).    [provider]  pantoprazole  (PROTONIX ) 40 MG tablet Take 1 tablet (40 mg total) by mouth daily. 07/02/19   Elgergawy, Dawood S, MD  polyethylene glycol (MIRALAX  / GLYCOLAX ) 17 g packet Take 17 g by mouth daily. Patient taking differently: Take 17 g by mouth daily as needed for moderate constipation. 08/30/23   Sebastian Toribio GAILS, MD  pravastatin  (PRAVACHOL ) 10 MG tablet Take 10 mg by mouth at bedtime.    [provider]  prazosin  (MINIPRESS ) 1 MG capsule Take 1 mg by mouth at bedtime.    [provider]  promethazine -dextromethorphan  (PROMETHAZINE -DM) 6.25-15 MG/5ML syrup Take 5 mLs by mouth 4 (four) times daily as needed for cough. 02/27/24   Airyana Sprunger, Warren SAILOR, PA-C  senna-docusate (SENOKOT-S) 8.6-50 MG tablet Take 1 tablet by mouth 2 (two) times daily. Patient taking differently: Take 1 tablet by mouth 2 (two) times daily as needed for mild constipation or moderate constipation. 08/29/23   Sebastian Toribio GAILS, MD  sildenafil  (VIAGRA ) 100 MG tablet Take 1 tablet (100 mg total) by mouth daily as needed. Patient taking differently: Take 100 mg by mouth daily as needed for erectile dysfunction. 09/28/23     tamsulosin  (FLOMAX ) 0.4 MG CAPS capsule Take 1 capsule (0.4 mg total) by mouth daily. 08/29/23   Sebastian Toribio GAILS, MD  Tiotropium Bromide  Monohydrate 2.5 MCG/ACT  AERS Inhale 2 puffs into the lungs daily.    [provider]  vitamin C (ASCORBIC ACID ) 250 MG tablet Take 250 mg by mouth daily.    [provider]    Allergies: Insulins, Porcine (pork) protein-containing drug products, Acetaminophen , Ibuprofen, Lisinopril, Shrimp [shellfish allergy ], Insulin  aspart, and Amoxicillin -pot clavulanate    Review of Systems  Respiratory:  Positive for shortness of breath.     Updated Vital Signs BP (!) 151/101 (BP Location: Right Arm)   Pulse 97   Temp 98 F (36.7 C) (Oral)   Resp (!) 24   Ht 5' 5 (1.651 m)   Wt 62.6 kg   SpO2 96%   BMI 22.96 kg/m   Physical Exam Vitals and nursing note reviewed.  Constitutional:      General: He is not in acute distress.    Appearance: He is not toxic-appearing.  HENT:     Head: Normocephalic and atraumatic.  Eyes:     General: No scleral icterus.    Conjunctiva/sclera: Conjunctivae normal.  Cardiovascular:     Rate and Rhythm: Regular rhythm. Tachycardia present.     Pulses: Normal pulses.     Heart sounds: Normal heart sounds.  Pulmonary:     Effort: Pulmonary effort is normal. Tachypnea present. No respiratory distress.     Breath sounds: Decreased breath sounds and rhonchi present.  Abdominal:     General: Abdomen is flat. Bowel sounds are normal.     Palpations: Abdomen is soft.     Tenderness: There is no abdominal tenderness.  Musculoskeletal:     Right lower leg: No edema.     Left lower leg: No edema.  Skin:    General: Skin is warm and dry.     Findings: No lesion.  Neurological:     General: No focal deficit present.     Mental Status: He is alert and oriented to person, place, and time. Mental status is at baseline.     (all labs ordered are listed, but only abnormal results are displayed) Labs Reviewed  CBC WITH DIFFERENTIAL/PLATELET - Abnormal; Notable for the following components:      Result Value   Hemoglobin 11.5 (*)    HCT 38.5 (*)    MCH 25.8 (*)     MCHC 29.9 (*)    RDW 17.2 (*)    All other components within normal limits  BASIC METABOLIC PANEL WITH GFR - Abnormal; Notable for the following components:   CO2 35 (*)    Glucose, Bld 187 (*)    All other components within normal limits  RESP PANEL BY RT-PCR (RSV, FLU A&B, COVID)  RVPGX2  CULTURE, BLOOD (ROUTINE X 2)  CULTURE, BLOOD (ROUTINE X 2)  LACTIC ACID, PLASMA  TROPONIN T, HIGH SENSITIVITY    EKG: None  Radiology: DG Chest 2 View Result Date: 02/27/2024 CLINICAL DATA:  Shortness of breath EXAM: CHEST - 2 VIEW COMPARISON:  December 11, 2023 FINDINGS: The heart size and mediastinal contours are within normal limits. Elevated left hemidiaphragm is noted. Minimal left basilar subsegmental atelectasis or scarring is noted. Minimal right basilar scarring is noted. Postsurgical changes are seen in the right upper lobe. The visualized skeletal structures are unremarkable. IMPRESSION: Minimal bibasilar scarring or subsegmental atelectasis. Electronically Signed   By: Lynwood Landy Raddle M.D.   On: 02/27/2024 11:04     Procedures   Medications Ordered in the ED  ipratropium-albuterol  (DUONEB) 0.5-2.5 (3) MG/3ML nebulizer solution (3 mLs  Given 02/27/24 0951)  albuterol  (PROVENTIL ) (2.5 MG/3ML) 0.083% nebulizer solution (5 mg  Given 02/27/24 0951)  methylPREDNISolone  sodium succinate (SOLU-MEDROL ) 125 mg/2 mL injection 125 mg (125 mg Intravenous Given 02/27/24 1046)  magnesium  sulfate IVPB 2 g 50 mL (2 g Intravenous New Bag/Given 02/27/24 1050)                                    Medical Decision Making Amount and/or Complexity of Data Reviewed Labs: ordered. Radiology: ordered.  Risk Prescription drug management.   This patient presents to the ED for concern of shortness of breath, this involves an extensive number of treatment options, and is a complaint that carries with it a  high risk of complications and morbidity.  The differential diagnosis includes CHF, PE, pneumonia,  pneumothorax, pulmonary embolus, asthma, COPD   Co morbidities that complicate the patient evaluation  COPD   Additional history obtained:  Additional history obtained from patient was admitted to the hospital service 01/14/2024 for COPD exacerbation   Lab Tests:  I personally interpreted labs.  The pertinent results include:   No leukocytosis, stable anemia.  No electrolyte abnormality.  Respiratory panel is negative.  Negative lactic.  Troponin is 15. Patient was initially tachycardic with increased respiratory rate on arrival which improved after being placed on his baseline oxygen , cultures ordered and pending.   Imaging Studies ordered:  I ordered imaging studies including chest x-ray I independently visualized and interpreted imaging which showed no acute findings or obvious pneumonia seen on x-ray I agree with the radiologist interpretation   Cardiac Monitoring: / EKG:  The patient was maintained on a cardiac monitor.  I personally viewed and interpreted the cardiac monitored which showed an underlying rhythm of: sinus tachycardia     Problem List / ED Course / Critical interventions / Medication management  Patient presents to emergency room with complaint of increased shortness of breath, subjective fever at home and productive cough.  He reports that this started after being in contact with another sick individual.  He has had to use inhalers or at home.  Once placed on his baseline 2 L of nasal cannula he had no increased oxygen  requirement and tachycardia and tachypnea resolved.  He had diffuse wheezing on exam consistent with PE.  He also responded very well to 2 DuoNeb treatments here.  He has no leukocytosis and no evidence of pneumonia on chest x-ray.  He has no evidence of fluid overload on exam thus doubt CHF.  Respiratory panel is negative.  Does complain of chronic chest pain but has no EKG changes and did not normal troponin thus doubt ACS at this time.  Patient  has no history of PE and no symptoms concerning for DVT thus I feel PE is less likely diagnosis at this time. I ordered medication including Duo neb, albuterol , mag, solumedrol  Reevaluation of the patient after these medicines showed that the patient improved I have reviewed the patients home medicines and have made adjustments as needed. Given patient's good response to breathing treatments here and improvement in symptoms, no sign of systemic illness and not meeting SIRS criteria feel he stable for discharge with close outpatient follow-up. He is on baseline O2. Will treat as COPD exacerbation with steroids, cough medicine and start on azithromycin .  He was given strict return precautions.      Final diagnoses:  COPD exacerbation [facility])    ED Discharge Orders          Ordered    promethazine -dextromethorphan  (PROMETHAZINE -DM) 6.25-15 MG/5ML syrup  4 times daily PRN        02/27/24 1216    predniSONE  (DELTASONE ) 20 MG tablet  Daily        02/27/24 1216    azithromycin  (ZITHROMAX ) 250 MG tablet  Daily        02/27/24 1216               Shaindel Sweeten, Warren SAILOR, PA-C 02/27/24 1251  "

## 2024-02-27 NOTE — Telephone Encounter (Signed)
 Received signed placard at [employer], left voicemail informing patient form will be placed upfront for pick up at the [facility] location

## 2024-02-27 NOTE — Discharge Instructions (Signed)
 Please take 5 days of steroids as prescribed.  I recommend starting antibiotic and take as prescribed.  Continue using albuterol  inhaler as needed at home.  Use cough medicine as needed.  Return to the ED with new or worsening symptoms including increased need for oxygen , severe shortness of breath or worsening chest pain.  Lease follow-up with your primary care for recheck of symptoms in 3 to 5 days.

## 2024-02-29 NOTE — Progress Notes (Unsigned)
 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     Mr. Staples was seen in 2023 by Dr. Shallow for pre-operative clearance. He noted atypical chest pain. Given this and his risk factors, a stress test was ordered. This was done 05/2021 and was low risk. It did show that this LVEF was moderately decreased. He was referred for echo which was done 11/18/2021. It showed LVEF 55-60%, G1DD.   He has had several admissions since then for respiratory failure. He follows with pulmonology for sarcoidosis. He has been placed on home oxygen .  He presented to the ER 01/10/2024 with dyspnea and a cough that worsened over the past month. He reported exertional chest pain as well. His EKG did not show ischemic changes but did have evidence of LVH. Troponins negative. Echo 01/11/2024 showed LVEF 55-60%, no RWMAs, G2DD, normal RV, moderately elevated PASP, no valvular abnormalities. Cardiology was consulted and recommended outpatient coronary CTA. He was discharged 01/14/2024 with cardiology follow-up.  Coronary CTA 02/12/2024 showed a coronary calcium  score of 238 (87th percentile) with mild non-obstructive CAD. Mild stenosis noted in the LM, ostial LAD, and proximal LCx.    History of Present Illness: Today he is doing okay. He tells me that he was hospitalized for asthma and that he was concerned about a cough. He does not experience chest pain. His breathing has improved. He wears 2L of oxygen  but he is not wearing it currently. He also has chronic pain for which  medications help temporarily. He has numbness in his bilateral arms and hands and requests a prescription for nitroglycerin . He has taken his wife's before and it helped. We reviewed his coronary CTA and echo extensively. He does not monitor his blood pressure at home, but he does have a cuff. His is unsure of his medications but plans to send a list in this afternoon via MyChart. He tells me that he has swelling in his ankles today and he has never noticed this before.   ROS: Denies chest pain, PND, palpitations, lightheadedness, dizziness, syncope, abnormal bleeding.   Studies Reviewed: The following studies were reviewed today: Cardiac Studies & Procedures   ______________________________________________________________________________________________   STRESS TESTS  MYOCARDIAL PERFUSION IMAGING 06/03/2021  Interpretation Summary   The study is normal. The study is low risk.   No ST deviation was noted.   Diaphragmatic attenuation artifact was present. Image quality affected due to significant extracardiac activity.   LV perfusion is normal. There is no evidence of ischemia. There is no evidence of infarction.   Left ventricular function is normal. Nuclear stress EF is calculated at 41 % but visually appears 55-60%. The left ventricular ejection fraction is moderately decreased (30-44%). End diastolic cavity size is normal. End systolic cavity size is normal.   Prior study not available for comparison.   Recommend 2D echo for further assessment of LVF and EF.   ECHOCARDIOGRAM  ECHOCARDIOGRAM COMPLETE 01/11/2024  Narrative ECHOCARDIOGRAM REPORT    Patient Name:   Glenn [name] Date of Exam: 01/11/2024 Medical Rec #:  [id number]  Height:       65.0 in Accession #:    [id number]    Weight:       145.0 lb Date of Birth:  April 06, 1960     BSA:          1.725 m Patient Age:    63 years      BP:           132/80 mmHg Patient Gender: M             HR:           86 bpm. Exam Location:   Inpatient  Procedure: 2D Echo (Both Spectral and Color Flow Doppler were utilized during procedure).  Indications:    CP  History:        Patient has prior history of Echocardiogram examinations. Signs/Symptoms:Chest Pain.  Sonographer:    Norleen Amour Referring Phys: [phone] NILDA HERO GHERGHE  IMPRESSIONS   1. Left ventricular ejection fraction, by estimation, is 55 to 60%. Left ventricular ejection fraction by 2D MOD biplane is 58.9 %. The left ventricle has normal function. The left ventricle has no regional wall motion abnormalities. Left ventricular diastolic parameters are consistent with Grade II diastolic dysfunction (pseudonormalization). 2. Right ventricular systolic function is normal. The right ventricular size is normal. There is moderately elevated pulmonary artery systolic pressure. 3. Left atrial size was mildly dilated. 4. The mitral valve is normal in structure. No evidence of mitral valve regurgitation. No evidence of mitral stenosis. 5. The aortic valve was not well visualized. Aortic valve regurgitation is not visualized. No aortic stenosis is present. 6. The inferior vena cava is normal in size with greater than 50% respiratory variability, suggesting right atrial pressure of 3 mmHg.  Comparison(s): No significant change from prior study.  FINDINGS Left Ventricle: Left ventricular ejection fraction, by estimation, is 55 to 60%. Left ventricular ejection fraction by 2D MOD biplane is 58.9 %. The left ventricle has normal function. The left ventricle has no regional wall motion abnormalities. The left ventricular internal cavity size was normal in size. There is no left ventricular hypertrophy. Left ventricular diastolic parameters are consistent with Grade II diastolic dysfunction (pseudonormalization).  Right Ventricle: The right ventricular size is normal. No increase in right ventricular wall thickness. Right ventricular systolic function is normal. There is  moderately elevated pulmonary artery systolic pressure. The tricuspid regurgitant velocity is 3.64 m/s, and with an assumed right atrial pressure of 3 mmHg, the estimated right ventricular systolic pressure is 56.0 mmHg.  Left Atrium: Left atrial size was mildly dilated.  Right Atrium: Right atrial size was normal in size.  Pericardium: There is no evidence of pericardial effusion.  Mitral Valve: The mitral valve is normal in structure. No evidence of mitral valve regurgitation. No evidence of mitral valve stenosis. MV peak gradient, 2.5 mmHg. The mean mitral valve gradient is 1.0 mmHg.  Tricuspid Valve: The tricuspid valve is normal in structure. Tricuspid valve regurgitation is mild . No evidence of tricuspid stenosis.  Aortic Valve: The aortic valve was not well visualized. Aortic valve regurgitation is not visualized. No aortic stenosis is present. Aortic valve mean gradient measures 3.0 mmHg. Aortic valve peak gradient measures 6.2 mmHg. Aortic valve area, by VTI measures 2.96 cm.  Pulmonic Valve: The pulmonic valve was not well visualized. Pulmonic valve regurgitation is not visualized. No evidence of pulmonic stenosis.  Aorta: The aortic root is normal in size and structure.  Venous: The inferior vena cava is normal in size  with greater than 50% respiratory variability, suggesting right atrial pressure of 3 mmHg.  IAS/Shunts: No atrial level shunt detected by color flow Doppler.   LEFT VENTRICLE PLAX 2D                        Biplane EF (MOD) LVIDd:         4.60 cm         LV Biplane EF:   Left LVIDs:         2.50 cm                          ventricular LV PW:         1.00 cm                          ejection LV IVS:        0.70 cm                          fraction by LVOT diam:     1.99 cm                          2D MOD LV SV:         66                               biplane is LV SV Index:   38                               58.9 %. LVOT Area:     3.11 cm Diastology LV  e' medial:    7.07 cm/s LV Volumes (MOD)               LV E/e' medial:  8.7 LV vol d, MOD    91.7 ml       LV e' lateral:   9.25 cm/s A2C:                           LV E/e' lateral: 6.7 LV vol d, MOD    88.5 ml A4C: LV vol s, MOD    35.6 ml A2C: LV vol s, MOD    38.2 ml A4C: LV SV MOD A2C:   56.1 ml LV SV MOD A4C:   88.5 ml LV SV MOD BP:    53.7 ml  RIGHT VENTRICLE             IVC RV Basal diam:  3.84 cm     IVC diam: 1.36 cm RV S prime:     11.60 cm/s TAPSE (M-mode): 2.1 cm      PULMONARY VEINS Diastolic Velocity: 62.80 cm/s S/D Velocity:       1.40 Systolic Velocity:  89.80 cm/s  LEFT ATRIUM             Index        RIGHT ATRIUM           Index LA diam:        3.04 cm 1.76 cm/m   RA Area:     11.70 cm LA Vol (A2C):   60.6 ml 35.12 ml/m  RA Volume:   26.40  ml  15.30 ml/m LA Vol (A4C):   69.9 ml 40.51 ml/m LA Biplane Vol: 65.3 ml 37.85 ml/m AORTIC VALVE                    PULMONIC VALVE AV Area (Vmax):    2.33 cm     PV Vmax:       0.83 m/s AV Area (Vmean):   2.34 cm     PV Peak grad:  2.7 mmHg AV Area (VTI):     2.96 cm AV Vmax:           124.00 cm/s AV Vmean:          81.500 cm/s AV VTI:            0.223 m AV Peak Grad:      6.2 mmHg AV Mean Grad:      3.0 mmHg LVOT Vmax:         92.70 cm/s LVOT Vmean:        61.300 cm/s LVOT VTI:          0.212 m LVOT/AV VTI ratio: 0.95  AORTA Ao Root diam: 2.99 cm Ao Asc diam:  2.71 cm  MITRAL VALVE               TRICUSPID VALVE MV Area (PHT): 5.93 cm    TR Peak grad:   53.0 mmHg MV Area VTI:   3.81 cm    TR Vmax:        364.00 cm/s MV Peak grad:  2.5 mmHg MV Mean grad:  1.0 mmHg    SHUNTS MV Vmax:       0.79 m/s    Systemic VTI:  0.21 m MV Vmean:      51.0 cm/s   Systemic Diam: 1.99 cm MV Decel Time: 128 msec MV E velocity: 61.80 cm/s MV A velocity: 75.00 cm/s MV E/A ratio:  0.82  Franck Azobou Tonleu Electronically signed by Joelle Cedars Tonleu Signature Date/Time: 01/11/2024/1:21:02 PM    Final       CT SCANS  CT CORONARY MORPH W/CTA COR W/SCORE 02/12/2024  Addendum 02/24/2024  4:43 PM ADDENDUM REPORT: 02/24/2024 16:40  EXAM: OVER-READ INTERPRETATION  CT CHEST  The following report is an over-read performed by radiologist Dr. Pinkie Chill [facility] on 02/24/2024. This over-read does not include interpretation of cardiac or coronary anatomy or pathology. The coronary CTA interpretation by the cardiologist is attached.  COMPARISON:  CTA chest dated 01/10/2024  FINDINGS: Subpleural scarring/reticulation in the bilateral lower lobes with moderate centrilobular and paraseptal emphysematous changes.  The heart is normal in size.  No pericardial effusion.  Eventration of the left hemidiaphragm. Upper abdomen is grossly unremarkable.  Osseous structures are unremarkable.  IMPRESSION: No significant extracardiac abnormality.   Electronically Signed By: Pinkie Pebbles M.D. On: 02/24/2024 16:40  Narrative CLINICAL DATA:  Chest pain  EXAM: Cardiac CTA  MEDICATIONS: Sub lingual nitro. 4mg  x 2  TECHNIQUE: A non-contrast, gated CT scan was obtained with axial slices of 2.5 mm through the heart for calcium  scoring. Calcium  scoring was performed using the Agatston method. A 120 kV prospective, gated, contrast cardiac CT scan was obtained. Gantry rotation speed was 230 msec and collimation was 0.63 mm. Two sublingual nitroglycerin  tablets (0.8 mg) were given. The 3D data set was reconstructed with motion correction for the best systolic or diastolic phase. Images were analyzed on a dedicated workstation using MPR, MIP, and VRT modes. The patient received 95  cc contrast.  FINDINGS: FINDINGS  Non-cardiac: See separate report from [facility].  Normal caliber aortic root and ascending aorta. Pulmonary veins drain normally to the left atrium. No LA appendage thrombus.  Calcium  Score: 238 Agatston units.  Coronary Arteries: Right dominant  with no anomalies  LM: Mixed plaque distal left main, mild (1-24%) stenosis.  LAD system: Mixed plaque at the ostium, mild (1-24%) stenosis.  Circumflex system: Moderate ramus with mixed plaque proximally, mild (1-24%) stenosis. AV LCx with mixed plaque proximally, mild (1-24%) stenosis.  RCA system: No plaque or stenosis.  IMPRESSION: 1. Coronary artery calcium  score 238 Agatston units. This places the patient in the 87th percentile for age and gender, suggesting high risk for future cardiac events.  2.  Mild nonobstructive coronary disease.  [employer]  Electronically Signed: By: Ezra Shuck M.D. On: 02/13/2024 12:40     ______________________________________________________________________________________________      EKG Interpretation Date/Time:  Friday March 01 2024 09:55:42 EST Ventricular Rate:  81 PR Interval:  118 QRS Duration:  78 QT Interval:  368 QTC Calculation: 427 R Axis:   24  Text Interpretation: Normal sinus rhythm Confirmed by Nola Numbers ([id number]) on 03/01/2024 10:03:19 AM            Physical Exam: VS: BP (!) 152/98   Pulse 81   Ht 5' 5 (1.651 m)   Wt 144 lb 12.8 oz (65.7 kg)   SpO2 90%   BMI 24.10 kg/m   GEN: In NAD HEENT: Normal NECK: No JVD CARDIAC: RRR, no murmurs, rubs, gallops RESPIRATORY: Diminished bilaterally ABDOMEN: Soft, non-tender, non-distended MUSCULOSKELETAL: No edema SKIN: Warm and dry NEUROLOGIC:  Alert and oriented x 3 PSYCHIATRIC:  Flat affect   Assessment & Plan: 1. Hypertension: BP today 152/98 initially, 168/106 on my recheck, 176/101 on automatic cuff. He does not monitor his BP at home, but he does have a cuff. His wife works in a nursing facility and will assist him in monitoring it. On review, his blood pressure in the hospital has been in the 140s-150s/90s-100s. - Start chlorthalidone 25 mg daily - Check BMP in 1-2 weeks - Provided BP log and advised to check BP two hours after he takes morning  medications - he will send these results in via MyChart - Continue amlodipine  10 mg daily  2. Mild non-obstructive CAD: Noted on outpatient coronary CTA 02/12/2024 after being hospitalized for chronic respiratory failure and noting chest pain with exertion. His coronary calcium  score was 238 with mild stenosis of the LM, ost LAD, and prox LCx. No ischemic changes on EKG today. He denies anginal symptoms. He is unsure if he is taking pravastatin  or another statin. - He will send me a list of his current medications via MyChart - if he is taking pravastatin , we will switch this to atorvastatin  40 mg daily and follow labs - Continue aspirin  81 mg daily   3. Hyperlipidemia: 01/12/2024 LDL 58, HDL 47, TGs 40, total 113, LPA 55. Well-controlled, though he is unsure of which statin he is currently taking.  - If he is taking pravastatin , we will switch this to atorvastatin  40 mg daily and follow labs  4. Diastolic dysfunction: G2DD noted on echo 01/2024 during hospitalization. He has chronic dyspnea on exertion that is likely multifactorial but doubt it is cardiac in nature. He has pulmonary sarcoidosis and wears home O2. He feels that his ankles are swollen today, but this has never happened before. He appears euvolemic on exam.  - Start  chlorthalidone 25 mg daily for hypertension - diuretic affect could benefit him  5. Bilateral arm/hand numbness: This started a couple of weeks ago. He was just seen by his PCP but symptoms had not started yet. He is on chronic steroids for sarcoidosis and his last A1C had increased to 7.6. He requested a prescription for nitroglycerin , but we had a discussion that these symptoms were not likely cardiac in nature. His wife agreed. - Recommend following up with PCP  6. Chronic respiratory failure/pulmonary sarcoidosis: He follows with pulmonology and is on chronic steroids for management according to his wife. He has home O2 prescribed, but he is not wearing it. His oxygen   saturation is 90%. He is scheduled for pulmonary rehabilitation. - Continue to follow with pulmonology for management  Dispo: Follow-up in 4-6 weeks.  Signed, Saddie GORMAN Cleaves, NP 03/01/2024 11:38 AM [facility] "

## 2024-03-01 ENCOUNTER — Ambulatory Visit: Attending: General Practice

## 2024-03-01 ENCOUNTER — Encounter: Payer: Self-pay | Admitting: General Practice

## 2024-03-01 VITALS — BP 152/98 | HR 81 | Ht 65.0 in | Wt 144.8 lb

## 2024-03-01 DIAGNOSIS — I10 Essential (primary) hypertension: Secondary | ICD-10-CM

## 2024-03-01 DIAGNOSIS — E782 Mixed hyperlipidemia: Secondary | ICD-10-CM | POA: Diagnosis not present

## 2024-03-01 DIAGNOSIS — I5189 Other ill-defined heart diseases: Secondary | ICD-10-CM

## 2024-03-01 DIAGNOSIS — I2510 Atherosclerotic heart disease of native coronary artery without angina pectoris: Secondary | ICD-10-CM

## 2024-03-01 MED ORDER — CHLORTHALIDONE 25 MG PO TABS: 25.0000 mg | ORAL_TABLET | Freq: Every day | ORAL | 3 refills | Status: AC

## 2024-03-01 NOTE — Patient Instructions (Addendum)
 Medication Instructions:  Start Chlorthalidone 25mg  daily *If you need a refill on your cardiac medications before your next appointment, please call your pharmacy*  Lab Work: BMET in 1 - 2 weeks.  If you have labs (blood work) drawn today and your tests are completely normal, you will receive your results only by: MyChart Message (if you have MyChart) OR A paper copy in the mail If you have any lab test that is abnormal or we need to change your treatment, we will call you to review the results.  Testing/Procedures: None   Follow-Up: At [facility], you and your health needs are our priority.  As part of our continuing mission to provide you with exceptional heart care, our providers are all part of one team.  This team includes your primary Cardiologist (physician) and Advanced Practice Providers or APPs (Physician Assistants and Nurse Practitioners) who all work together to provide you with the care you need, when you need it.  Your next appointment:   4 - 6 weeks   Provider:   Redell Shallow, MD or Josefa Beauvais, NP    Other Instructions Check and record blood pressure daily, log and send 2 weeks of readings to the office.  Please send a list of current medications that you are taking so that your medication list can be verified.

## 2024-03-03 LAB — CULTURE, BLOOD (ROUTINE X 2)
Culture: NO GROWTH
Culture: NO GROWTH
Special Requests: ADEQUATE
Special Requests: ADEQUATE

## 2024-03-05 ENCOUNTER — Telehealth (HOSPITAL_COMMUNITY): Payer: Self-pay

## 2024-03-05 NOTE — Telephone Encounter (Signed)
 Called and confirmed Pulm Rehab Orientation appt for tomorrow at 10:30am.

## 2024-03-06 ENCOUNTER — Encounter (HOSPITAL_COMMUNITY): Payer: Self-pay

## 2024-03-06 ENCOUNTER — Encounter (HOSPITAL_COMMUNITY)
Admission: RE | Admit: 2024-03-06 | Discharge: 2024-03-06 | Disposition: A | Discharge status: Home / Self Care | Source: Ambulatory Visit | Attending: Pulmonary Disease | Admitting: Pulmonary Disease

## 2024-03-06 VITALS — BP 120/80 | HR 85 | Ht 65.0 in | Wt 140.4 lb

## 2024-03-06 DIAGNOSIS — J449 Chronic obstructive pulmonary disease, unspecified: Secondary | ICD-10-CM | POA: Insufficient documentation

## 2024-03-06 NOTE — Telephone Encounter (Signed)
 Spoke with patient and he would like placard mailed to address on file. Placard placed in the mail.

## 2024-03-06 NOTE — Progress Notes (Signed)
 Pulmonary Individual Treatment Plan  Patient Details  Name: Glenn [name] MRN: [id number] Date of Birth: 19-Dec-1960 Referring Provider:   Conrad Row PULMONARY REHAB COPD ORIENTATION from 03/06/2024 in [facility]  Referring Provider Burks-Bermudez    Initial Encounter Date:  Flowsheet Row PULMONARY REHAB COPD ORIENTATION from 03/06/2024 in [facility]  Date 03/06/24    Visit Diagnosis: Chronic obstructive pulmonary disease, unspecified COPD type (HCC)  Patient's Home Medications on Admission:  Current Medications[1]  Past Medical History: Past Medical History:  Diagnosis Date   Anxiety    Arthritis    Asthma    Chronic pain 05/06/2020   COPD (chronic obstructive pulmonary disease) (HCC)    CVA (cerebral vascular accident) (HCC)    2010; 06/2020   Depression    Diabetes mellitus type 2 in nonobese [facility])    Dyspnea    ED (erectile dysfunction)    Hyperlipidemia    Hypertension    Sarcoid    Tobacco dependence 05/06/2020    Tobacco Use: Tobacco Use History[2]  Labs: Review Flowsheet  More data exists      Latest Ref Rng & Units 07/19/2023 08/26/2023 01/10/2024 01/11/2024 01/12/2024  Labs for ITP Cardiac and Pulmonary Rehab  Cholestrol 0 - 200 mg/dL - - - - 886   LDL (calc) 0 - 99 mg/dL - - - - 58   HDL-C >59 mg/dL - - - - 47   Trlycerides <150 mg/dL - - - - 40   Hemoglobin A1c 4.8 - 5.6 % - 7.6  - - -  Bicarbonate 20.0 - 28.0 mmol/L 32.8  - 33.2  35.3  35.2  -  TCO2 22 - 32 mmol/L 34  - 38  - -  O2 Saturation % 93  - 100  48  97.6  -    Details       Multiple values from one day are sorted in reverse-chronological order         Capillary Blood Glucose: Lab Results  Component Value Date   GLUCAP 301 (H) 01/14/2024   GLUCAP 115 (H) 01/14/2024   GLUCAP 141 (H) 01/13/2024   GLUCAP 153 (H) 01/13/2024   GLUCAP 138 (H) 01/13/2024      Pulmonary Assessment Scores:  Pulmonary Assessment Scores     Row Name 03/06/24 1032         ADL UCSD   ADL Phase Entry     SOB Score total 67       CAT Score   CAT Score 22       UCSD: Self-administered rating of dyspnea associated with activities of daily living (ADLs) 6-point scale (0 = not at all to 5 = maximal or unable to do because of breathlessness)  Scoring Scores range from 0 to 120.  Minimally important difference is 5 units  CAT: CAT can identify the health impairment of COPD patients and is better correlated with disease progression.  CAT has a scoring range of zero to 40. The CAT score is classified into four groups of low (less than 10), medium (10 - 20), high (21-30) and very high (31-40) based on the impact level of disease on health status. A CAT score over 10 suggests significant symptoms.  A worsening CAT score could be explained by an exacerbation, poor medication adherence, poor inhaler technique, or progression of COPD or comorbid conditions.  CAT MCID is 2 points  mMRC: mMRC (Modified Medical Research Council) Dyspnea Scale is used to assess the degree of baseline functional disability in patients of respiratory disease due to dyspnea. No minimal important difference is established. A decrease in score of 1 point or greater is considered a positive change.   Pulmonary Function Assessment:  Pulmonary Function Assessment - 03/06/24 1031       Breath   Bilateral Breath Sounds Clear;Decreased    Shortness of Breath Yes;Fear of Shortness of Breath;Limiting activity          Exercise Target Goals: Exercise Program Goal: Individual exercise prescription set using results from initial 6 min walk test and THRR while considering  patients activity barriers and safety.   Exercise Prescription Goal: Initial exercise prescription builds to 30-45 minutes a day of aerobic activity, 2-3 days per week.  Home exercise guidelines will be given to  patient during program as part of exercise prescription that the participant will acknowledge.  Activity Barriers & Risk Stratification:  Activity Barriers & Cardiac Risk Stratification - 03/06/24 1030       Activity Barriers & Cardiac Risk Stratification   Activity Barriers Deconditioning;Muscular Weakness;Shortness of Breath;Back Problems;Arthritis          6 Minute Walk:  6 Minute Walk     Row Name 03/06/24 1137         6 Minute Walk   Phase Initial     Distance 960 feet     Walk Time 6 minutes     # of Rest Breaks 0     MPH 1.82     METS 2.85     RPE 9     Perceived Dyspnea  0     VO2 Peak 9.97     Symptoms No     Resting HR 85 bpm     Resting BP 120/80     Resting Oxygen  Saturation  99 %     Exercise Oxygen  Saturation  during 6 min walk 86 %     Max Ex. HR 105 bpm     Max Ex. BP 128/62     2 Minute Post BP 120/70       Interval HR   1 Minute HR 91     2 Minute HR 98     3 Minute HR 90     4 Minute HR 96     5 Minute HR 99     6 Minute HR 105     2 Minute Post HR 88     Interval Heart Rate? Yes       Interval Oxygen    Interval Oxygen ? Yes     Baseline Oxygen  Saturation % 99 %     1 Minute Oxygen  Saturation % 89 %  86% at 0:21     1 Minute Liters of Oxygen  2 L  increased to 3     2 Minute Oxygen  Saturation % 93 %  87% at 1:08     2 Minute Liters of Oxygen  3 L  increased to 4L     3 Minute Oxygen  Saturation % 96 %     3 Minute Liters of Oxygen  4 L     4 Minute Oxygen  Saturation % 94 %     4 Minute Liters of Oxygen  4 L     5 Minute Oxygen  Saturation % 96 %     5 Minute Liters of Oxygen  4 L     6 Minute Oxygen  Saturation % 94 %  6 Minute Liters of Oxygen  4 L     2 Minute Post Oxygen  Saturation % 98 %     2 Minute Post Liters of Oxygen  4 L        Oxygen  Initial Assessment:  Oxygen  Initial Assessment - 03/06/24 1031       Home Oxygen    Home Oxygen  Device Portable Concentrator;Home Concentrator    Sleep Oxygen  Prescription Continuous     Liters per minute 2    Home Exercise Oxygen  Prescription Continuous    Liters per minute 2    Home Resting Oxygen  Prescription Continuous    Liters per minute 2    Compliance with Home Oxygen  Use Yes      Initial 6 min Walk   Oxygen  Used Continuous    Liters per minute 4      Program Oxygen  Prescription   Program Oxygen  Prescription Continuous    Liters per minute 4      Intervention   Short Term Goals To learn and understand importance of monitoring SPO2 with pulse oximeter and demonstrate accurate use of the pulse oximeter.;To learn and demonstrate proper pursed lip breathing techniques or other breathing techniques. ;To learn and exhibit compliance with exercise, home and travel O2 prescription;To learn and understand importance of maintaining oxygen  saturations>88%;To learn and demonstrate proper use of respiratory medications    Lapoint  Term Goals Exhibits compliance with exercise, home  and travel O2 prescription;Verbalizes importance of monitoring SPO2 with pulse oximeter and return demonstration;Maintenance of O2 saturations>88%;Exhibits proper breathing techniques, such as pursed lip breathing or other method taught during program session;Compliance with respiratory medication;Demonstrates proper use of MDIs          Oxygen  Re-Evaluation:   Oxygen  Discharge (Final Oxygen  Re-Evaluation):   Initial Exercise Prescription:  Initial Exercise Prescription - 03/06/24 1100       Date of Initial Exercise RX and Referring Provider   Date 03/06/24    Referring Provider Burks-Bermudez    Expected Discharge Date 05/28/24      Recumbant Elliptical   Level 1    RPM 70    Watts 50    Minutes 15    METs 2      Track   Minutes 15    METs 2      Prescription Details   Frequency (times per week) 2    Duration Progress to 30 minutes of continuous aerobic without signs/symptoms of physical distress      Intensity   THRR 40-80% of Max Heartrate 62-125    Ratings of Perceived  Exertion 11-13    Perceived Dyspnea 0-4      Progression   Progression Continue to progress workloads to maintain intensity without signs/symptoms of physical distress.      Resistance Training   Training Prescription Yes    Weight blue bands    Reps 10-15          Perform Capillary Blood Glucose checks as needed.  Exercise Prescription Changes:   Exercise Comments:   Exercise Goals and Review:   Exercise Goals     Row Name 03/06/24 1030             Exercise Goals   Increase Physical Activity Yes       Intervention Provide advice, education, support and counseling about physical activity/exercise needs.;Develop an individualized exercise prescription for aerobic and resistive training based on initial evaluation findings, risk stratification, comorbidities and participant's personal goals.       Expected Outcomes  Short Term: Attend rehab on a regular basis to increase amount of physical activity.;Rajewski Term: Add in home exercise to make exercise part of routine and to increase amount of physical activity.;Ciolek Term: Exercising regularly at least 3-5 days a week.       Increase Strength and Stamina Yes       Intervention Provide advice, education, support and counseling about physical activity/exercise needs.;Develop an individualized exercise prescription for aerobic and resistive training based on initial evaluation findings, risk stratification, comorbidities and participant's personal goals.       Expected Outcomes Short Term: Increase workloads from initial exercise prescription for resistance, speed, and METs.;Short Term: Perform resistance training exercises routinely during rehab and add in resistance training at home;Buel Term: Improve cardiorespiratory fitness, muscular endurance and strength as measured by increased METs and functional capacity ( )       Able to understand and use rate of perceived exertion (RPE) scale Yes       Intervention Provide education and  explanation on how to use RPE scale       Expected Outcomes Short Term: Able to use RPE daily in rehab to express subjective intensity level;Strassner Term:  Able to use RPE to guide intensity level when exercising independently       Able to understand and use Dyspnea scale Yes       Intervention Provide education and explanation on how to use Dyspnea scale       Expected Outcomes Short Term: Able to use Dyspnea scale daily in rehab to express subjective sense of shortness of breath during exertion;Tarver Term: Able to use Dyspnea scale to guide intensity level when exercising independently       Knowledge and understanding of Target Heart Rate Range (THRR) Yes       Intervention Provide education and explanation of THRR including how the numbers were predicted and where they are located for reference       Expected Outcomes Short Term: Able to state/look up THRR;Cline Term: Able to use THRR to govern intensity when exercising independently;Short Term: Able to use daily as guideline for intensity in rehab       Understanding of Exercise Prescription Yes       Intervention Provide education, explanation, and written materials on patient's individual exercise prescription       Expected Outcomes Short Term: Able to explain program exercise prescription;Fenley Term: Able to explain home exercise prescription to exercise independently          Exercise Goals Re-Evaluation :   Discharge Exercise Prescription (Final Exercise Prescription Changes):   Nutrition:  Target Goals: Understanding of nutrition guidelines, daily intake of sodium 1500mg , cholesterol 200mg , calories 30% from fat and 7% or less from saturated fats, daily to have 5 or more servings of fruits and vegetables.  Biometrics:    Nutrition Therapy Plan and Nutrition Goals:   Nutrition Assessments:  MEDIFICTS Score Key: >=70 Need to make dietary changes  40-70 Heart Healthy Diet <= 40 Therapeutic Level Cholesterol Diet   Picture  Your Plate Scores: <59 Unhealthy dietary pattern with much room for improvement. 41-50 Dietary pattern unlikely to meet recommendations for good health and room for improvement. 51-60 More healthful dietary pattern, with some room for improvement.  >60 Healthy dietary pattern, although there may be some specific behaviors that could be improved.    Nutrition Goals Re-Evaluation:   Nutrition Goals Discharge (Final Nutrition Goals Re-Evaluation):   Psychosocial: Target Goals: Acknowledge presence or absence of significant depression  and/or stress, maximize coping skills, provide positive support system. Participant is able to verbalize types and ability to use techniques and skills needed for reducing stress and depression.  Initial Review & Psychosocial Screening:  Initial Psych Review & Screening - 03/06/24 1037       Initial Review   Current issues with Current Psychotropic Meds;History of Depression;Current Anxiety/Panic;Current Sleep Concerns    Comments Aurelius states he has a history of depression, anxiety, PTSD and nightmares with sleep. He is on medication for these diagnoses and sees his therapist regularly. He states he mental health as been stable for some time now. He declines any needs, resources or referrals.      Family Dynamics   Good Support System? Yes    Comments spouse      Barriers   Psychosocial barriers to participate in program There are no identifiable barriers or psychosocial needs.      Screening Interventions   Interventions Encouraged to exercise;Provide feedback about the scores to participant    Expected Outcomes Short Term goal: Utilizing psychosocial counselor, staff and physician to assist with identification of specific Stressors or current issues interfering with healing process. Setting desired goal for each stressor or current issue identified.;Hoben Term Goal: Stressors or current issues are controlled or eliminated.;Short Term goal: Identification  and review with participant of any Quality of Life or Depression concerns found by scoring the questionnaire.;Grimsley Term goal: The participant improves quality of Life and PHQ9 Scores as seen by post scores and/or verbalization of changes          Quality of Life Scores:  Scores of 19 and below usually indicate a poorer quality of life in these areas.  A difference of  2-3 points is a clinically meaningful difference.  A difference of 2-3 points in the total score of the Quality of Life Index has been associated with significant improvement in overall quality of life, self-image, physical symptoms, and general health in studies assessing change in quality of life.  PHQ-9: Review Flowsheet       03/06/2024 12/20/2021 08/06/2020  Depression screen PHQ 2/9  Decreased Interest 0 0 0  Down, Depressed, Hopeless 0 0 0  PHQ - 2 Score 0 0 0  Altered sleeping 0 3 -  Tired, decreased energy 1 0 -  Change in appetite 0 0 -  Feeling bad or failure about yourself  3 0 -  Trouble concentrating 3 0 -  Moving slowly or fidgety/restless 0 0 -  Suicidal thoughts 0 0 -  PHQ-9 Score 7 3  -  Difficult doing work/chores Not difficult at all Somewhat difficult -    Details       Data saved with a previous flowsheet row definition        Interpretation of Total Score  Total Score Depression Severity:  1-4 = Minimal depression, 5-9 = Mild depression, 10-14 = Moderate depression, 15-19 = Moderately severe depression, 20-27 = Severe depression   Psychosocial Evaluation and Intervention:  Psychosocial Evaluation - 03/06/24 1111       Psychosocial Evaluation & Interventions   Interventions Encouraged to exercise with the program and follow exercise prescription    Comments Octavia states he has a history of depression, anxiety, PTSD and nightmares with sleep. He is on medication for these diagnoses and sees his therapist regularly. He states he mental health as been stable for some time now. He declines  any needs, resources or referrals.    Expected Outcomes For Eldar to  participate in PR free of psychosocial barriers to continue to have stable mental health throughout the program    Continue Psychosocial Services  Follow up required by staff          Psychosocial Re-Evaluation:   Psychosocial Discharge (Final Psychosocial Re-Evaluation):   Education: Education Goals: Education classes will be provided on a weekly basis, covering required topics. Participant will state understanding/return demonstration of topics presented.  Learning Barriers/Preferences:  Learning Barriers/Preferences - 03/06/24 1111       Learning Barriers/Preferences   Learning Barriers Sight   wears glasses   Learning Preferences Written Material          Education Topics: Know Your Numbers Group instruction that is supported by a PowerPoint presentation. Instructor discusses importance of knowing and understanding resting, exercise, and post-exercise oxygen  saturation, heart rate, and blood pressure. Oxygen  saturation, heart rate, blood pressure, rating of perceived exertion, and dyspnea are reviewed along with a normal range for these values.    Exercise for the Pulmonary Patient Group instruction that is supported by a PowerPoint presentation. Instructor discusses benefits of exercise, core components of exercise, frequency, duration, and intensity of an exercise routine, importance of utilizing pulse oximetry during exercise, safety while exercising, and options of places to exercise outside of rehab.    MET Level  Group instruction provided by PowerPoint, verbal discussion, and written material to support subject matter. Instructor reviews what METs are and how to increase METs.    Pulmonary Medications Verbally interactive group education provided by instructor with focus on inhaled medications and proper administration.   Anatomy and Physiology of the Respiratory System Group instruction  provided by PowerPoint, verbal discussion, and written material to support subject matter. Instructor reviews respiratory cycle and anatomical components of the respiratory system and their functions. Instructor also reviews differences in obstructive and restrictive respiratory diseases with examples of each.    Oxygen  Safety Group instruction provided by PowerPoint, verbal discussion, and written material to support subject matter. There is an overview of What is Oxygen  and Why do we need it.  Instructor also reviews how to create a safe environment for oxygen  use, the importance of using oxygen  as prescribed, and the risks of noncompliance. There is a brief discussion on traveling with oxygen  and resources the patient may utilize.   Oxygen  Use Group instruction provided by PowerPoint, verbal discussion, and written material to discuss how supplemental oxygen  is prescribed and different types of oxygen  supply systems. Resources for more information are provided.    Breathing Techniques Group instruction that is supported by demonstration and informational handouts. Instructor discusses the benefits of pursed lip and diaphragmatic breathing and detailed demonstration on how to perform both.     Risk Factor Reduction Group instruction that is supported by a PowerPoint presentation. Instructor discusses the definition of a risk factor, different risk factors for pulmonary disease, and how the heart and lungs work together.   Pulmonary Diseases Group instruction provided by PowerPoint, verbal discussion, and written material to support subject matter. Instructor gives an overview of the different type of pulmonary diseases. There is also a discussion on risk factors and symptoms as well as ways to manage the diseases.   Stress and Energy Conservation Group instruction provided by PowerPoint, verbal discussion, and written material to support subject matter. Instructor gives an overview of  stress and the impact it can have on the body. Instructor also reviews ways to reduce stress. There is also a discussion on energy conservation and ways  to conserve energy throughout the day.   Warning Signs and Symptoms Group instruction provided by PowerPoint, verbal discussion, and written material to support subject matter. Instructor reviews warning signs and symptoms of stroke, heart attack, cold and flu. Instructor also reviews ways to prevent the spread of infection.   Other Education Group or individual verbal, written, or video instructions that support the educational goals of the pulmonary rehab program.    Knowledge Questionnaire Score:  Knowledge Questionnaire Score - 03/06/24 1112       Knowledge Questionnaire Score   Pre Score 13/18          Core Components/Risk Factors/Patient Goals at Admission:  Personal Goals and Risk Factors at Admission - 03/06/24 1112       Core Components/Risk Factors/Patient Goals on Admission    Weight Management Weight Maintenance;Yes    Intervention Weight Management: Develop a combined nutrition and exercise program designed to reach desired caloric intake, while maintaining appropriate intake of nutrient and fiber, sodium and fats, and appropriate energy expenditure required for the weight goal.;Weight Management: Provide education and appropriate resources to help participant work on and attain dietary goals.    Admit Weight 140 lb 6.9 oz (63.7 kg)    Expected Outcomes Short Term: Continue to assess and modify interventions until short term weight is achieved;Dehner Term: Adherence to nutrition and physical activity/exercise program aimed toward attainment of established weight goal;Weight Maintenance: Understanding of the daily nutrition guidelines, which includes 25-35% calories from fat, 7% or less cal from saturated fats, less than 200mg  cholesterol, less than 1.5gm of sodium, & 5 or more servings of fruits and vegetables  daily;Understanding recommendations for meals to include 15-35% energy as protein, 25-35% energy from fat, 35-60% energy from carbohydrates, less than 200mg  of dietary cholesterol, 20-35 gm of total fiber daily;Understanding of distribution of calorie intake throughout the day with the consumption of 4-5 meals/snacks    Improve shortness of breath with ADL's Yes    Intervention Provide education, individualized exercise plan and daily activity instruction to help decrease symptoms of SOB with activities of daily living.    Expected Outcomes Short Term: Improve cardiorespiratory fitness to achieve a reduction of symptoms when performing ADLs;Eline Term: Be able to perform more ADLs without symptoms or delay the onset of symptoms          Core Components/Risk Factors/Patient Goals Review:    Core Components/Risk Factors/Patient Goals at Discharge (Final Review):    ITP Comments:   Comments: Dr. Slater Staff is Medical Director for Pulmonary Rehab at [facility].       [1]  Current Outpatient Medications:    albuterol  (VENTOLIN  HFA) 108 (90 Base) MCG/ACT inhaler, Inhale 1-2 puffs into the lungs every 6 (six) hours as needed for wheezing or shortness of breath. (Patient taking differently: Inhale 2 puffs into the lungs every 6 (six) hours as needed for shortness of breath.), Disp: 18 g, Rfl: 0   amLODipine  (NORVASC ) 10 MG tablet, Take 1 tablet (10 mg total) by mouth daily., Disp: 30 tablet, Rfl: 3   aspirin  EC 81 MG tablet, Take 81 mg by mouth daily. Swallow whole., Disp: , Rfl:    azithromycin  (ZITHROMAX ) 250 MG tablet, Take first 2 tablets together, then 1 every day until finished., Disp: 6 tablet, Rfl: 0   budesonide -formoterol  (SYMBICORT ) 160-4.5 MCG/ACT inhaler, Inhale 2 puffs into the lungs 2 (two) times daily., Disp: 10.2 g, Rfl: 1   Cholecalciferol (VITAMIN D3) 50 MCG (2000 UT) CHEW, Chew 4,000  Units by mouth daily., Disp: , Rfl:    clopidogrel  (PLAVIX ) 75 MG tablet, Take 1  tablet (75 mg total) by mouth daily., Disp: 30 tablet, Rfl: 1   docusate sodium  (COLACE) 100 MG capsule, Take 100 mg by mouth 2 (two) times daily as needed for mild constipation., Disp: , Rfl:    EPINEPHrine  0.3 mg/0.3 mL IJ SOAJ injection, Inject 0.3 mg into the muscle as needed for anaphylaxis., Disp: , Rfl:    famotidine  (PEPCID ) 20 MG tablet, One after supper (Patient taking differently: Take 20 mg by mouth daily as needed for heartburn or indigestion.), Disp: 30 tablet, Rfl: 11   ferrous sulfate  325 (65 FE) MG tablet, Take 325 mg by mouth every Monday, Wednesday, and Friday., Disp: , Rfl:    fluticasone  (FLONASE ) 50 MCG/ACT nasal spray, Place 2 sprays into both nostrils daily for 10 days. (Patient taking differently: Place 2 sprays into both nostrils daily as needed for allergies or rhinitis (or seasonal allergies).), Disp: 16 g, Rfl: 2   guaiFENesin  (MUCINEX ) 600 MG 12 hr tablet, Take 600 mg by mouth in the morning., Disp: , Rfl:    hydrOXYzine  (ATARAX ) 25 MG tablet, Take 25 mg by mouth at bedtime., Disp: , Rfl:    ipratropium-albuterol  (DUONEB) 0.5-2.5 (3) MG/3ML SOLN, Take 3 mLs by nebulization 3 (three) times daily for 5 days, THEN 3 mLs every 6 (six) hours as needed for up to 5 days. (Patient taking differently: Nebulize 3 ml's and inhale into the lungs 4-5 times a day), Disp: 360 mL, Rfl: 1   loratadine  (CLARITIN ) 10 MG tablet, Take 1 tablet (10 mg total) by mouth daily., Disp: 30 tablet, Rfl: 5   metFORMIN  (GLUCOPHAGE ) 500 MG tablet, Take 1 tablet (500 mg total) by mouth 2 (two) times daily with a meal. (Patient taking differently: Take 500 mg by mouth 2 (two) times daily before a meal.), Disp: 60 tablet, Rfl: 3   metoprolol  tartrate (LOPRESSOR ) 100 MG tablet, Take tablet (100mg ) TWO hours prior to your cardiac CT scan., Disp: 1 tablet, Rfl: 0   montelukast  (SINGULAIR ) 10 MG tablet, Take 10 mg by mouth at bedtime., Disp: , Rfl:    naloxone  (NARCAN ) nasal spray 4 mg/0.1 mL, Place 1 spray into  the nose as needed (opioid overdose - respiratory distress)., Disp: , Rfl:    oxyCODONE  (OXYCONTIN ) 10 mg 12 hr tablet, Take 10 mg by mouth every 12 (twelve) hours., Disp: , Rfl:    Oxycodone  HCl 20 MG TABS, Take 20 mg by mouth every 6 (six) hours as needed (pain)., Disp: , Rfl:    OXYGEN , Inhale 2-3 L/min into the lungs as needed (for shortness of breath when exerted)., Disp: , Rfl:    pantoprazole  (PROTONIX ) 40 MG tablet, Take 1 tablet (40 mg total) by mouth daily., Disp: 30 tablet, Rfl: 0   polyethylene glycol (MIRALAX  / GLYCOLAX ) 17 g packet, Take 17 g by mouth daily. (Patient taking differently: Take 17 g by mouth daily as needed for moderate constipation.), Disp: , Rfl:    pravastatin  (PRAVACHOL ) 10 MG tablet, Take 10 mg by mouth at bedtime., Disp: , Rfl:    prazosin  (MINIPRESS ) 1 MG capsule, Take 1 mg by mouth at bedtime., Disp: , Rfl:    predniSONE  (DELTASONE ) 20 MG tablet, Take 2 tablets (40 mg total) by mouth daily., Disp: 10 tablet, Rfl: 0   promethazine -dextromethorphan  (PROMETHAZINE -DM) 6.25-15 MG/5ML syrup, Take 5 mLs by mouth 4 (four) times daily as needed for cough., Disp: 240 mL,  Rfl: 2   senna-docusate (SENOKOT-S) 8.6-50 MG tablet, Take 1 tablet by mouth 2 (two) times daily. (Patient taking differently: Take 1 tablet by mouth 2 (two) times daily as needed for mild constipation or moderate constipation.), Disp: , Rfl:    sildenafil  (VIAGRA ) 100 MG tablet, Take 1 tablet (100 mg total) by mouth daily as needed. (Patient taking differently: Take 100 mg by mouth daily as needed for erectile dysfunction.), Disp: 30 tablet, Rfl: 5   tamsulosin  (FLOMAX ) 0.4 MG CAPS capsule, Take 1 capsule (0.4 mg total) by mouth daily., Disp: 30 capsule, Rfl: 1   Tiotropium Bromide  Monohydrate 2.5 MCG/ACT AERS, Inhale 2 puffs into the lungs daily., Disp: , Rfl:    vitamin C (ASCORBIC ACID ) 250 MG tablet, Take 250 mg by mouth daily., Disp: , Rfl:    chlorthalidone  (HYGROTON ) 25 MG tablet, Take 1 tablet (25 mg  total) by mouth daily. (Patient not taking: Reported on 03/06/2024), Disp: 90 tablet, Rfl: 3 [2]  Social History Tobacco Use  Smoking Status Former   Current packs/day: 0.00   Average packs/day: 1 pack/day for 34.0 years (34.0 ttl pk-yrs)   Types: Cigarettes   Start date: 05/12/1986   Quit date: 05/11/2020   Years since quitting: 3.8   Passive exposure: Never  Smokeless Tobacco Never  Tobacco Comments   at the most smoked 6-7 cigarettes/day

## 2024-03-06 NOTE — Progress Notes (Signed)
" °  Pulmonary Rehab Orientation Note This patient who was [facility] to Pulmonary Re[facility] with [facility] arrived today in Cardiac and Pulmonary Rehab. He arrived ambulatory with normal gait. He does carry portable oxygen . [employer] is the provider for their DME. Per patient, Glenn [name] uses oxygen  continuously. Color good, skin warm and dry. Patient is oriented to time and place. Patient's medical history, psychosocial health, and medications reviewed. Psychosocial assessment reveals patient lives with spouse. 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 Patient hobbies include watching tv. Patient reports his stress level is low. Areas of stress/anxiety include health. Patient does possibly exhibit signs of depression. Signs of depression include hopelessness and fatigue. PHQ2/9 score 0/7. Glenn [name] shows fair  coping skills with positive outlook on life. Offered emotional support and reassurance. Will continue to monitor and evaluate progress toward psychosocial goal(s) of decreased health related stress. Pt has a therapist he sees regularly through the [facility] and has psychotropic medications managed by his PCP. Physical assessment reveals heart rate is normal, breath sounds clear/diminished to auscultation, no wheezes, rales, or rhonchi. Grip strength equal, strong. Distal pulses present. Glenn [name] reports he  does take medications as prescribed. Patient states he  follows a regular  diet. The patient reports no specific efforts to gain or lose weight.. Pt's weight will be monitored closely. Demonstration and practice of PLB using pulse oximeter. Glenn [name] able to return demonstration satisfactorily. Safety and hand hygiene in the exercise area reviewed with patient. Glenn [name] voices understanding of the information reviewed. Department expectations discussed with patient and achievable goals were set. The patient shows enthusiasm about attending the program and we look  forward to working with Glenn [name]. Glenn [name] completed a 6 min walk test today and is scheduled to begin exercise on 2/3 at 1015.   [id number] Glenn [name]   "

## 2024-03-11 ENCOUNTER — Telehealth (HOSPITAL_COMMUNITY): Payer: Self-pay

## 2024-03-11 NOTE — Telephone Encounter (Signed)
 Called pt about inclement weather. LVM with call back number.

## 2024-03-12 ENCOUNTER — Encounter (HOSPITAL_COMMUNITY)

## 2024-03-12 ENCOUNTER — Telehealth: Payer: Self-pay

## 2024-03-12 DIAGNOSIS — E782 Mixed hyperlipidemia: Secondary | ICD-10-CM

## 2024-03-12 DIAGNOSIS — I10 Essential (primary) hypertension: Secondary | ICD-10-CM

## 2024-03-12 DIAGNOSIS — I2510 Atherosclerotic heart disease of native coronary artery without angina pectoris: Secondary | ICD-10-CM

## 2024-03-12 MED ORDER — ATORVASTATIN CALCIUM 40 MG PO TABS: 40.0000 mg | ORAL_TABLET | Freq: Every day | ORAL | 0 refills | Status: AC

## 2024-03-12 NOTE — Telephone Encounter (Signed)
 Advised pt of provider's orders to have him stop pravastatin , start atorvastatin  40 mg daily, repeat lipids and hepatic function panel in 6-8 weeks.  He's aware of pending BMP that needs to be drawn with future labs. Pt's unable to monitor BP at home and unsure if he's started chlorthalidone . Pt stated he will check when he gets home.

## 2024-03-14 ENCOUNTER — Other Ambulatory Visit (HOSPITAL_COMMUNITY): Payer: Self-pay

## 2024-03-14 ENCOUNTER — Encounter (HOSPITAL_COMMUNITY): Admission: RE | Admit: 2024-03-14 | Source: Ambulatory Visit

## 2024-03-15 ENCOUNTER — Telehealth (HOSPITAL_COMMUNITY): Payer: Self-pay

## 2024-03-15 NOTE — Telephone Encounter (Signed)
 Called pt to check on him since he missed PR sessions this week. He stated he has not been able to attend due to the weather and will be in class on Tuesday.

## 2024-03-19 ENCOUNTER — Encounter (HOSPITAL_COMMUNITY)

## 2024-03-21 ENCOUNTER — Encounter (HOSPITAL_COMMUNITY)

## 2024-03-26 ENCOUNTER — Encounter (HOSPITAL_COMMUNITY)

## 2024-03-28 ENCOUNTER — Encounter (HOSPITAL_COMMUNITY)

## 2024-04-02 ENCOUNTER — Encounter (HOSPITAL_COMMUNITY)

## 2024-04-04 ENCOUNTER — Encounter (HOSPITAL_COMMUNITY)

## 2024-04-09 ENCOUNTER — Encounter (HOSPITAL_COMMUNITY)

## 2024-04-11 ENCOUNTER — Encounter (HOSPITAL_COMMUNITY)

## 2024-04-16 ENCOUNTER — Encounter (HOSPITAL_COMMUNITY)

## 2024-04-17 ENCOUNTER — Ambulatory Visit: Admitting: Emergency Medicine

## 2024-04-18 ENCOUNTER — Encounter (HOSPITAL_COMMUNITY)

## 2024-04-23 ENCOUNTER — Encounter (HOSPITAL_COMMUNITY)

## 2024-04-25 ENCOUNTER — Encounter (HOSPITAL_COMMUNITY)

## 2024-04-30 ENCOUNTER — Encounter (HOSPITAL_COMMUNITY)

## 2024-05-02 ENCOUNTER — Encounter (HOSPITAL_COMMUNITY)

## 2024-05-07 ENCOUNTER — Encounter (HOSPITAL_COMMUNITY)

## 2024-05-09 ENCOUNTER — Encounter (HOSPITAL_COMMUNITY)

## 2024-05-14 ENCOUNTER — Encounter (HOSPITAL_COMMUNITY)

## 2024-05-16 ENCOUNTER — Encounter (HOSPITAL_COMMUNITY)

## 2024-05-21 ENCOUNTER — Encounter (HOSPITAL_COMMUNITY)

## 2024-05-23 ENCOUNTER — Encounter (HOSPITAL_COMMUNITY)

## 2024-05-28 ENCOUNTER — Encounter (HOSPITAL_COMMUNITY)

## 2024-07-10 ENCOUNTER — Ambulatory Visit: Admitting: Internal Medicine
